# Patient Record
Sex: Male | Born: 1940 | Race: White | Hispanic: No | Smoking: Never smoker
Health system: Southern US, Community
[De-identification: ages and names within clinical notes are randomized; demographics above are authoritative.]

## PROBLEM LIST (undated history)

## (undated) DIAGNOSIS — I4821 Permanent atrial fibrillation: Secondary | ICD-10-CM

## (undated) DIAGNOSIS — I5032 Chronic diastolic (congestive) heart failure: Secondary | ICD-10-CM

## (undated) DIAGNOSIS — I739 Peripheral vascular disease, unspecified: Secondary | ICD-10-CM

## (undated) DIAGNOSIS — M109 Gout, unspecified: Secondary | ICD-10-CM

## (undated) DIAGNOSIS — N184 Chronic kidney disease, stage 4 (severe): Secondary | ICD-10-CM

## (undated) DIAGNOSIS — L97509 Non-pressure chronic ulcer of other part of unspecified foot with unspecified severity: Secondary | ICD-10-CM

## (undated) DIAGNOSIS — K859 Acute pancreatitis without necrosis or infection, unspecified: Secondary | ICD-10-CM

## (undated) DIAGNOSIS — Z22322 Carrier or suspected carrier of Methicillin resistant Staphylococcus aureus: Secondary | ICD-10-CM

## (undated) DIAGNOSIS — I10 Essential (primary) hypertension: Secondary | ICD-10-CM

## (undated) DIAGNOSIS — E119 Type 2 diabetes mellitus without complications: Secondary | ICD-10-CM

## (undated) DIAGNOSIS — E1140 Type 2 diabetes mellitus with diabetic neuropathy, unspecified: Secondary | ICD-10-CM

## (undated) DIAGNOSIS — E039 Hypothyroidism, unspecified: Secondary | ICD-10-CM

## (undated) HISTORY — DX: Non-pressure chronic ulcer of other part of unspecified foot with unspecified severity: L97.509

## (undated) HISTORY — DX: Gout, unspecified: M10.9

## (undated) HISTORY — DX: Hypothyroidism, unspecified: E03.9

## (undated) HISTORY — PX: ANKLE FRACTURE SURGERY: SHX122

## (undated) HISTORY — PX: MELANOMA EXCISION: SHX5266

## (undated) HISTORY — DX: Type 2 diabetes mellitus without complications: E11.9

## (undated) HISTORY — DX: Essential (primary) hypertension: I10

## (undated) HISTORY — PX: CATARACT EXTRACTION: SUR2

## (undated) HISTORY — PX: NEPHRECTOMY TRANSPLANTED ORGAN: SUR880

---

## 1998-03-14 ENCOUNTER — Encounter: Payer: Self-pay | Admitting: Ophthalmology

## 1998-03-15 ENCOUNTER — Ambulatory Visit (HOSPITAL_COMMUNITY): Admission: RE | Admit: 1998-03-15 | Discharge: 1998-03-15 | Payer: Self-pay | Admitting: Ophthalmology

## 1998-03-15 ENCOUNTER — Encounter: Payer: Self-pay | Admitting: Ophthalmology

## 2000-08-03 ENCOUNTER — Encounter: Payer: Self-pay | Admitting: General Surgery

## 2000-08-05 ENCOUNTER — Ambulatory Visit (HOSPITAL_COMMUNITY): Admission: RE | Admit: 2000-08-05 | Discharge: 2000-08-05 | Payer: Self-pay | Admitting: General Surgery

## 2002-09-06 ENCOUNTER — Encounter: Payer: Self-pay | Admitting: Emergency Medicine

## 2002-09-07 ENCOUNTER — Inpatient Hospital Stay (HOSPITAL_COMMUNITY): Admission: AC | Admit: 2002-09-07 | Discharge: 2002-09-08 | Payer: Self-pay

## 2002-09-08 ENCOUNTER — Encounter: Payer: Self-pay | Admitting: General Surgery

## 2004-07-29 ENCOUNTER — Other Ambulatory Visit: Admission: RE | Admit: 2004-07-29 | Discharge: 2004-07-29 | Payer: Self-pay | Admitting: Otolaryngology

## 2006-05-18 ENCOUNTER — Ambulatory Visit: Payer: Self-pay | Admitting: Oncology

## 2006-08-10 ENCOUNTER — Ambulatory Visit: Payer: Self-pay | Admitting: Oncology

## 2010-04-06 HISTORY — PX: KIDNEY TRANSPLANT: SHX239

## 2011-04-10 DIAGNOSIS — Z79899 Other long term (current) drug therapy: Secondary | ICD-10-CM | POA: Diagnosis not present

## 2011-04-10 DIAGNOSIS — Z7901 Long term (current) use of anticoagulants: Secondary | ICD-10-CM | POA: Diagnosis not present

## 2011-04-10 DIAGNOSIS — Z940 Kidney transplant status: Secondary | ICD-10-CM | POA: Diagnosis not present

## 2011-04-15 DIAGNOSIS — D044 Carcinoma in situ of skin of scalp and neck: Secondary | ICD-10-CM | POA: Diagnosis not present

## 2011-04-15 DIAGNOSIS — Z85828 Personal history of other malignant neoplasm of skin: Secondary | ICD-10-CM | POA: Diagnosis not present

## 2011-05-12 DIAGNOSIS — E039 Hypothyroidism, unspecified: Secondary | ICD-10-CM | POA: Diagnosis not present

## 2011-05-13 DIAGNOSIS — Z940 Kidney transplant status: Secondary | ICD-10-CM | POA: Diagnosis not present

## 2011-05-13 DIAGNOSIS — Z7901 Long term (current) use of anticoagulants: Secondary | ICD-10-CM | POA: Diagnosis not present

## 2011-05-13 DIAGNOSIS — Z79899 Other long term (current) drug therapy: Secondary | ICD-10-CM | POA: Diagnosis not present

## 2011-05-14 DIAGNOSIS — E785 Hyperlipidemia, unspecified: Secondary | ICD-10-CM | POA: Diagnosis not present

## 2011-05-14 DIAGNOSIS — E039 Hypothyroidism, unspecified: Secondary | ICD-10-CM | POA: Diagnosis not present

## 2011-05-27 DIAGNOSIS — Z940 Kidney transplant status: Secondary | ICD-10-CM | POA: Diagnosis not present

## 2011-05-27 DIAGNOSIS — N2581 Secondary hyperparathyroidism of renal origin: Secondary | ICD-10-CM | POA: Diagnosis not present

## 2011-05-27 DIAGNOSIS — I129 Hypertensive chronic kidney disease with stage 1 through stage 4 chronic kidney disease, or unspecified chronic kidney disease: Secondary | ICD-10-CM | POA: Diagnosis not present

## 2011-06-01 DIAGNOSIS — I10 Essential (primary) hypertension: Secondary | ICD-10-CM | POA: Diagnosis not present

## 2011-06-01 DIAGNOSIS — N189 Chronic kidney disease, unspecified: Secondary | ICD-10-CM | POA: Diagnosis not present

## 2011-06-01 DIAGNOSIS — I517 Cardiomegaly: Secondary | ICD-10-CM | POA: Diagnosis not present

## 2011-06-01 DIAGNOSIS — I5030 Unspecified diastolic (congestive) heart failure: Secondary | ICD-10-CM | POA: Diagnosis not present

## 2011-06-01 DIAGNOSIS — Z7901 Long term (current) use of anticoagulants: Secondary | ICD-10-CM | POA: Diagnosis not present

## 2011-06-01 DIAGNOSIS — I4891 Unspecified atrial fibrillation: Secondary | ICD-10-CM | POA: Diagnosis not present

## 2011-06-09 DIAGNOSIS — Z7901 Long term (current) use of anticoagulants: Secondary | ICD-10-CM | POA: Diagnosis not present

## 2011-06-09 DIAGNOSIS — E119 Type 2 diabetes mellitus without complications: Secondary | ICD-10-CM | POA: Diagnosis not present

## 2011-06-09 DIAGNOSIS — I4891 Unspecified atrial fibrillation: Secondary | ICD-10-CM | POA: Diagnosis not present

## 2011-06-10 DIAGNOSIS — Z79899 Other long term (current) drug therapy: Secondary | ICD-10-CM | POA: Diagnosis not present

## 2011-06-10 DIAGNOSIS — Z940 Kidney transplant status: Secondary | ICD-10-CM | POA: Diagnosis not present

## 2011-06-11 DIAGNOSIS — E785 Hyperlipidemia, unspecified: Secondary | ICD-10-CM | POA: Diagnosis not present

## 2011-06-12 DIAGNOSIS — E785 Hyperlipidemia, unspecified: Secondary | ICD-10-CM | POA: Diagnosis not present

## 2011-06-12 DIAGNOSIS — E039 Hypothyroidism, unspecified: Secondary | ICD-10-CM | POA: Diagnosis not present

## 2011-06-16 DIAGNOSIS — Z7901 Long term (current) use of anticoagulants: Secondary | ICD-10-CM | POA: Diagnosis not present

## 2011-06-16 DIAGNOSIS — I4891 Unspecified atrial fibrillation: Secondary | ICD-10-CM | POA: Diagnosis not present

## 2011-06-23 DIAGNOSIS — E119 Type 2 diabetes mellitus without complications: Secondary | ICD-10-CM | POA: Diagnosis not present

## 2011-06-23 DIAGNOSIS — E11359 Type 2 diabetes mellitus with proliferative diabetic retinopathy without macular edema: Secondary | ICD-10-CM | POA: Diagnosis not present

## 2011-06-30 DIAGNOSIS — Z7901 Long term (current) use of anticoagulants: Secondary | ICD-10-CM | POA: Diagnosis not present

## 2011-06-30 DIAGNOSIS — I4891 Unspecified atrial fibrillation: Secondary | ICD-10-CM | POA: Diagnosis not present

## 2011-07-08 DIAGNOSIS — Z79899 Other long term (current) drug therapy: Secondary | ICD-10-CM | POA: Diagnosis not present

## 2011-07-08 DIAGNOSIS — Z940 Kidney transplant status: Secondary | ICD-10-CM | POA: Diagnosis not present

## 2011-07-08 DIAGNOSIS — N2581 Secondary hyperparathyroidism of renal origin: Secondary | ICD-10-CM | POA: Diagnosis not present

## 2011-07-14 DIAGNOSIS — Z7901 Long term (current) use of anticoagulants: Secondary | ICD-10-CM | POA: Diagnosis not present

## 2011-07-14 DIAGNOSIS — I4891 Unspecified atrial fibrillation: Secondary | ICD-10-CM | POA: Diagnosis not present

## 2011-07-27 DIAGNOSIS — Z940 Kidney transplant status: Secondary | ICD-10-CM | POA: Diagnosis not present

## 2011-07-27 DIAGNOSIS — I70219 Atherosclerosis of native arteries of extremities with intermittent claudication, unspecified extremity: Secondary | ICD-10-CM | POA: Diagnosis not present

## 2011-07-27 DIAGNOSIS — I4891 Unspecified atrial fibrillation: Secondary | ICD-10-CM | POA: Diagnosis not present

## 2011-08-05 DIAGNOSIS — Z940 Kidney transplant status: Secondary | ICD-10-CM | POA: Diagnosis not present

## 2011-08-05 DIAGNOSIS — Z79899 Other long term (current) drug therapy: Secondary | ICD-10-CM | POA: Diagnosis not present

## 2011-08-19 DIAGNOSIS — Z7901 Long term (current) use of anticoagulants: Secondary | ICD-10-CM | POA: Diagnosis not present

## 2011-08-19 DIAGNOSIS — I4891 Unspecified atrial fibrillation: Secondary | ICD-10-CM | POA: Diagnosis not present

## 2011-08-26 DIAGNOSIS — I743 Embolism and thrombosis of arteries of the lower extremities: Secondary | ICD-10-CM | POA: Diagnosis not present

## 2011-08-26 DIAGNOSIS — I70219 Atherosclerosis of native arteries of extremities with intermittent claudication, unspecified extremity: Secondary | ICD-10-CM | POA: Diagnosis not present

## 2011-09-02 DIAGNOSIS — I4891 Unspecified atrial fibrillation: Secondary | ICD-10-CM | POA: Diagnosis not present

## 2011-09-02 DIAGNOSIS — Z7901 Long term (current) use of anticoagulants: Secondary | ICD-10-CM | POA: Diagnosis not present

## 2011-09-15 DIAGNOSIS — I4891 Unspecified atrial fibrillation: Secondary | ICD-10-CM | POA: Diagnosis not present

## 2011-09-15 DIAGNOSIS — Z7901 Long term (current) use of anticoagulants: Secondary | ICD-10-CM | POA: Diagnosis not present

## 2011-09-16 DIAGNOSIS — Z79899 Other long term (current) drug therapy: Secondary | ICD-10-CM | POA: Diagnosis not present

## 2011-09-16 DIAGNOSIS — Z940 Kidney transplant status: Secondary | ICD-10-CM | POA: Diagnosis not present

## 2011-09-17 DIAGNOSIS — Z85828 Personal history of other malignant neoplasm of skin: Secondary | ICD-10-CM | POA: Diagnosis not present

## 2011-09-24 DIAGNOSIS — R059 Cough, unspecified: Secondary | ICD-10-CM | POA: Diagnosis not present

## 2011-09-24 DIAGNOSIS — R05 Cough: Secondary | ICD-10-CM | POA: Diagnosis not present

## 2011-09-28 DIAGNOSIS — Z940 Kidney transplant status: Secondary | ICD-10-CM | POA: Diagnosis not present

## 2011-09-28 DIAGNOSIS — I10 Essential (primary) hypertension: Secondary | ICD-10-CM | POA: Diagnosis not present

## 2011-09-28 DIAGNOSIS — D649 Anemia, unspecified: Secondary | ICD-10-CM | POA: Diagnosis not present

## 2011-09-30 DIAGNOSIS — E039 Hypothyroidism, unspecified: Secondary | ICD-10-CM | POA: Diagnosis not present

## 2011-09-30 DIAGNOSIS — E785 Hyperlipidemia, unspecified: Secondary | ICD-10-CM | POA: Diagnosis not present

## 2011-10-02 DIAGNOSIS — E785 Hyperlipidemia, unspecified: Secondary | ICD-10-CM | POA: Diagnosis not present

## 2011-10-02 DIAGNOSIS — E039 Hypothyroidism, unspecified: Secondary | ICD-10-CM | POA: Diagnosis not present

## 2011-10-07 DIAGNOSIS — Z79899 Other long term (current) drug therapy: Secondary | ICD-10-CM | POA: Diagnosis not present

## 2011-10-07 DIAGNOSIS — Z940 Kidney transplant status: Secondary | ICD-10-CM | POA: Diagnosis not present

## 2011-10-13 DIAGNOSIS — I4891 Unspecified atrial fibrillation: Secondary | ICD-10-CM | POA: Diagnosis not present

## 2011-10-13 DIAGNOSIS — Z7901 Long term (current) use of anticoagulants: Secondary | ICD-10-CM | POA: Diagnosis not present

## 2011-10-27 DIAGNOSIS — Z7901 Long term (current) use of anticoagulants: Secondary | ICD-10-CM | POA: Diagnosis not present

## 2011-10-27 DIAGNOSIS — I4891 Unspecified atrial fibrillation: Secondary | ICD-10-CM | POA: Diagnosis not present

## 2011-11-10 DIAGNOSIS — L608 Other nail disorders: Secondary | ICD-10-CM | POA: Diagnosis not present

## 2011-11-10 DIAGNOSIS — E1059 Type 1 diabetes mellitus with other circulatory complications: Secondary | ICD-10-CM | POA: Diagnosis not present

## 2011-11-10 DIAGNOSIS — M79609 Pain in unspecified limb: Secondary | ICD-10-CM | POA: Diagnosis not present

## 2011-11-11 DIAGNOSIS — Z79899 Other long term (current) drug therapy: Secondary | ICD-10-CM | POA: Diagnosis not present

## 2011-11-11 DIAGNOSIS — N2581 Secondary hyperparathyroidism of renal origin: Secondary | ICD-10-CM | POA: Diagnosis not present

## 2011-11-11 DIAGNOSIS — Z940 Kidney transplant status: Secondary | ICD-10-CM | POA: Diagnosis not present

## 2011-11-24 DIAGNOSIS — I4891 Unspecified atrial fibrillation: Secondary | ICD-10-CM | POA: Diagnosis not present

## 2011-11-24 DIAGNOSIS — Z7901 Long term (current) use of anticoagulants: Secondary | ICD-10-CM | POA: Diagnosis not present

## 2011-12-16 DIAGNOSIS — Z79899 Other long term (current) drug therapy: Secondary | ICD-10-CM | POA: Diagnosis not present

## 2011-12-16 DIAGNOSIS — Z940 Kidney transplant status: Secondary | ICD-10-CM | POA: Diagnosis not present

## 2011-12-25 DIAGNOSIS — H35379 Puckering of macula, unspecified eye: Secondary | ICD-10-CM | POA: Diagnosis not present

## 2012-01-04 DIAGNOSIS — I4891 Unspecified atrial fibrillation: Secondary | ICD-10-CM | POA: Diagnosis not present

## 2012-01-04 DIAGNOSIS — E785 Hyperlipidemia, unspecified: Secondary | ICD-10-CM | POA: Diagnosis not present

## 2012-01-04 DIAGNOSIS — N189 Chronic kidney disease, unspecified: Secondary | ICD-10-CM | POA: Diagnosis not present

## 2012-01-04 DIAGNOSIS — I5030 Unspecified diastolic (congestive) heart failure: Secondary | ICD-10-CM | POA: Diagnosis not present

## 2012-01-05 DIAGNOSIS — E039 Hypothyroidism, unspecified: Secondary | ICD-10-CM | POA: Diagnosis not present

## 2012-01-05 DIAGNOSIS — I4891 Unspecified atrial fibrillation: Secondary | ICD-10-CM | POA: Diagnosis not present

## 2012-01-05 DIAGNOSIS — E785 Hyperlipidemia, unspecified: Secondary | ICD-10-CM | POA: Diagnosis not present

## 2012-01-07 DIAGNOSIS — Z23 Encounter for immunization: Secondary | ICD-10-CM | POA: Diagnosis not present

## 2012-01-07 DIAGNOSIS — E039 Hypothyroidism, unspecified: Secondary | ICD-10-CM | POA: Diagnosis not present

## 2012-01-07 DIAGNOSIS — E785 Hyperlipidemia, unspecified: Secondary | ICD-10-CM | POA: Diagnosis not present

## 2012-01-12 DIAGNOSIS — N185 Chronic kidney disease, stage 5: Secondary | ICD-10-CM | POA: Diagnosis not present

## 2012-01-12 DIAGNOSIS — D751 Secondary polycythemia: Secondary | ICD-10-CM | POA: Diagnosis not present

## 2012-01-12 DIAGNOSIS — Z940 Kidney transplant status: Secondary | ICD-10-CM | POA: Diagnosis not present

## 2012-01-13 DIAGNOSIS — Z79899 Other long term (current) drug therapy: Secondary | ICD-10-CM | POA: Diagnosis not present

## 2012-01-13 DIAGNOSIS — Z940 Kidney transplant status: Secondary | ICD-10-CM | POA: Diagnosis not present

## 2012-02-17 DIAGNOSIS — Z79899 Other long term (current) drug therapy: Secondary | ICD-10-CM | POA: Diagnosis not present

## 2012-02-17 DIAGNOSIS — N2581 Secondary hyperparathyroidism of renal origin: Secondary | ICD-10-CM | POA: Diagnosis not present

## 2012-02-17 DIAGNOSIS — Z940 Kidney transplant status: Secondary | ICD-10-CM | POA: Diagnosis not present

## 2012-02-24 DIAGNOSIS — I4891 Unspecified atrial fibrillation: Secondary | ICD-10-CM | POA: Diagnosis not present

## 2012-02-24 DIAGNOSIS — Z7901 Long term (current) use of anticoagulants: Secondary | ICD-10-CM | POA: Diagnosis not present

## 2012-02-25 DIAGNOSIS — E039 Hypothyroidism, unspecified: Secondary | ICD-10-CM | POA: Diagnosis not present

## 2012-02-25 DIAGNOSIS — I10 Essential (primary) hypertension: Secondary | ICD-10-CM | POA: Diagnosis not present

## 2012-03-07 DIAGNOSIS — Z79899 Other long term (current) drug therapy: Secondary | ICD-10-CM | POA: Diagnosis not present

## 2012-03-07 DIAGNOSIS — E119 Type 2 diabetes mellitus without complications: Secondary | ICD-10-CM | POA: Diagnosis not present

## 2012-03-07 DIAGNOSIS — Z48298 Encounter for aftercare following other organ transplant: Secondary | ICD-10-CM | POA: Diagnosis not present

## 2012-03-07 DIAGNOSIS — N186 End stage renal disease: Secondary | ICD-10-CM | POA: Diagnosis not present

## 2012-03-07 DIAGNOSIS — Z940 Kidney transplant status: Secondary | ICD-10-CM | POA: Diagnosis not present

## 2012-03-08 DIAGNOSIS — M2040 Other hammer toe(s) (acquired), unspecified foot: Secondary | ICD-10-CM | POA: Diagnosis not present

## 2012-03-08 DIAGNOSIS — L97509 Non-pressure chronic ulcer of other part of unspecified foot with unspecified severity: Secondary | ICD-10-CM | POA: Diagnosis not present

## 2012-03-08 DIAGNOSIS — L03119 Cellulitis of unspecified part of limb: Secondary | ICD-10-CM | POA: Diagnosis not present

## 2012-03-08 DIAGNOSIS — Z85828 Personal history of other malignant neoplasm of skin: Secondary | ICD-10-CM | POA: Diagnosis not present

## 2012-03-08 DIAGNOSIS — E1069 Type 1 diabetes mellitus with other specified complication: Secondary | ICD-10-CM | POA: Diagnosis not present

## 2012-03-08 DIAGNOSIS — L02619 Cutaneous abscess of unspecified foot: Secondary | ICD-10-CM | POA: Diagnosis not present

## 2012-03-08 DIAGNOSIS — D485 Neoplasm of uncertain behavior of skin: Secondary | ICD-10-CM | POA: Diagnosis not present

## 2012-03-16 DIAGNOSIS — IMO0001 Reserved for inherently not codable concepts without codable children: Secondary | ICD-10-CM | POA: Diagnosis not present

## 2012-03-16 DIAGNOSIS — Z940 Kidney transplant status: Secondary | ICD-10-CM | POA: Diagnosis not present

## 2012-03-16 DIAGNOSIS — N2581 Secondary hyperparathyroidism of renal origin: Secondary | ICD-10-CM | POA: Diagnosis not present

## 2012-03-16 DIAGNOSIS — Z79899 Other long term (current) drug therapy: Secondary | ICD-10-CM | POA: Diagnosis not present

## 2012-03-22 DIAGNOSIS — Z7901 Long term (current) use of anticoagulants: Secondary | ICD-10-CM | POA: Diagnosis not present

## 2012-03-22 DIAGNOSIS — I4891 Unspecified atrial fibrillation: Secondary | ICD-10-CM | POA: Diagnosis not present

## 2012-04-01 DIAGNOSIS — B351 Tinea unguium: Secondary | ICD-10-CM | POA: Diagnosis not present

## 2012-04-01 DIAGNOSIS — M79609 Pain in unspecified limb: Secondary | ICD-10-CM | POA: Diagnosis not present

## 2012-04-01 DIAGNOSIS — E1069 Type 1 diabetes mellitus with other specified complication: Secondary | ICD-10-CM | POA: Diagnosis not present

## 2012-04-01 DIAGNOSIS — L608 Other nail disorders: Secondary | ICD-10-CM | POA: Diagnosis not present

## 2012-04-19 DIAGNOSIS — I4891 Unspecified atrial fibrillation: Secondary | ICD-10-CM | POA: Diagnosis not present

## 2012-04-19 DIAGNOSIS — Z7901 Long term (current) use of anticoagulants: Secondary | ICD-10-CM | POA: Diagnosis not present

## 2012-04-20 DIAGNOSIS — Z940 Kidney transplant status: Secondary | ICD-10-CM | POA: Diagnosis not present

## 2012-04-20 DIAGNOSIS — Z79899 Other long term (current) drug therapy: Secondary | ICD-10-CM | POA: Diagnosis not present

## 2012-04-20 DIAGNOSIS — N2581 Secondary hyperparathyroidism of renal origin: Secondary | ICD-10-CM | POA: Diagnosis not present

## 2012-05-10 DIAGNOSIS — I10 Essential (primary) hypertension: Secondary | ICD-10-CM | POA: Diagnosis not present

## 2012-05-10 DIAGNOSIS — Z940 Kidney transplant status: Secondary | ICD-10-CM | POA: Diagnosis not present

## 2012-05-10 DIAGNOSIS — D649 Anemia, unspecified: Secondary | ICD-10-CM | POA: Diagnosis not present

## 2012-05-10 DIAGNOSIS — N185 Chronic kidney disease, stage 5: Secondary | ICD-10-CM | POA: Diagnosis not present

## 2012-05-17 DIAGNOSIS — Z7901 Long term (current) use of anticoagulants: Secondary | ICD-10-CM | POA: Diagnosis not present

## 2012-05-17 DIAGNOSIS — I4891 Unspecified atrial fibrillation: Secondary | ICD-10-CM | POA: Diagnosis not present

## 2012-05-18 DIAGNOSIS — N2581 Secondary hyperparathyroidism of renal origin: Secondary | ICD-10-CM | POA: Diagnosis not present

## 2012-05-18 DIAGNOSIS — Z940 Kidney transplant status: Secondary | ICD-10-CM | POA: Diagnosis not present

## 2012-05-18 DIAGNOSIS — Z79899 Other long term (current) drug therapy: Secondary | ICD-10-CM | POA: Diagnosis not present

## 2012-05-26 DIAGNOSIS — E1069 Type 1 diabetes mellitus with other specified complication: Secondary | ICD-10-CM | POA: Diagnosis not present

## 2012-05-26 DIAGNOSIS — L97509 Non-pressure chronic ulcer of other part of unspecified foot with unspecified severity: Secondary | ICD-10-CM | POA: Diagnosis not present

## 2012-06-01 DIAGNOSIS — E039 Hypothyroidism, unspecified: Secondary | ICD-10-CM | POA: Diagnosis not present

## 2012-06-01 DIAGNOSIS — M109 Gout, unspecified: Secondary | ICD-10-CM | POA: Diagnosis not present

## 2012-06-01 DIAGNOSIS — E785 Hyperlipidemia, unspecified: Secondary | ICD-10-CM | POA: Diagnosis not present

## 2012-06-03 DIAGNOSIS — E119 Type 2 diabetes mellitus without complications: Secondary | ICD-10-CM | POA: Diagnosis not present

## 2012-06-03 DIAGNOSIS — N189 Chronic kidney disease, unspecified: Secondary | ICD-10-CM | POA: Diagnosis not present

## 2012-06-03 DIAGNOSIS — I4891 Unspecified atrial fibrillation: Secondary | ICD-10-CM | POA: Diagnosis not present

## 2012-06-03 DIAGNOSIS — M109 Gout, unspecified: Secondary | ICD-10-CM | POA: Diagnosis not present

## 2012-06-03 DIAGNOSIS — I10 Essential (primary) hypertension: Secondary | ICD-10-CM | POA: Diagnosis not present

## 2012-06-07 DIAGNOSIS — Z85828 Personal history of other malignant neoplasm of skin: Secondary | ICD-10-CM | POA: Diagnosis not present

## 2012-06-13 DIAGNOSIS — L03119 Cellulitis of unspecified part of limb: Secondary | ICD-10-CM | POA: Diagnosis not present

## 2012-06-13 DIAGNOSIS — M25473 Effusion, unspecified ankle: Secondary | ICD-10-CM | POA: Diagnosis not present

## 2012-06-13 DIAGNOSIS — E109 Type 1 diabetes mellitus without complications: Secondary | ICD-10-CM | POA: Diagnosis not present

## 2012-06-13 DIAGNOSIS — L02419 Cutaneous abscess of limb, unspecified: Secondary | ICD-10-CM | POA: Diagnosis not present

## 2012-06-13 DIAGNOSIS — M25579 Pain in unspecified ankle and joints of unspecified foot: Secondary | ICD-10-CM | POA: Diagnosis not present

## 2012-06-14 DIAGNOSIS — Z7901 Long term (current) use of anticoagulants: Secondary | ICD-10-CM | POA: Diagnosis not present

## 2012-06-14 DIAGNOSIS — I4891 Unspecified atrial fibrillation: Secondary | ICD-10-CM | POA: Diagnosis not present

## 2012-06-14 DIAGNOSIS — L02619 Cutaneous abscess of unspecified foot: Secondary | ICD-10-CM | POA: Diagnosis not present

## 2012-06-15 DIAGNOSIS — Z940 Kidney transplant status: Secondary | ICD-10-CM | POA: Diagnosis not present

## 2012-06-15 DIAGNOSIS — N2581 Secondary hyperparathyroidism of renal origin: Secondary | ICD-10-CM | POA: Diagnosis not present

## 2012-06-15 DIAGNOSIS — Z79899 Other long term (current) drug therapy: Secondary | ICD-10-CM | POA: Diagnosis not present

## 2012-06-16 DIAGNOSIS — Z79899 Other long term (current) drug therapy: Secondary | ICD-10-CM | POA: Diagnosis not present

## 2012-06-16 DIAGNOSIS — D751 Secondary polycythemia: Secondary | ICD-10-CM | POA: Diagnosis not present

## 2012-06-21 DIAGNOSIS — Z7901 Long term (current) use of anticoagulants: Secondary | ICD-10-CM | POA: Diagnosis not present

## 2012-06-21 DIAGNOSIS — I4891 Unspecified atrial fibrillation: Secondary | ICD-10-CM | POA: Diagnosis not present

## 2012-06-22 DIAGNOSIS — M25579 Pain in unspecified ankle and joints of unspecified foot: Secondary | ICD-10-CM | POA: Diagnosis not present

## 2012-06-22 DIAGNOSIS — L97409 Non-pressure chronic ulcer of unspecified heel and midfoot with unspecified severity: Secondary | ICD-10-CM | POA: Diagnosis not present

## 2012-06-22 DIAGNOSIS — L03119 Cellulitis of unspecified part of limb: Secondary | ICD-10-CM | POA: Diagnosis not present

## 2012-06-22 DIAGNOSIS — L02419 Cutaneous abscess of limb, unspecified: Secondary | ICD-10-CM | POA: Diagnosis not present

## 2012-06-24 ENCOUNTER — Encounter: Payer: Self-pay | Admitting: *Deleted

## 2012-06-24 ENCOUNTER — Telehealth (HOSPITAL_COMMUNITY): Payer: Self-pay | Admitting: *Deleted

## 2012-06-24 ENCOUNTER — Ambulatory Visit (INDEPENDENT_AMBULATORY_CARE_PROVIDER_SITE_OTHER): Payer: Medicare Other | Admitting: Podiatry

## 2012-06-24 VITALS — BP 136/74 | HR 70 | Ht 69.0 in | Wt 232.0 lb

## 2012-06-24 DIAGNOSIS — L98499 Non-pressure chronic ulcer of skin of other sites with unspecified severity: Secondary | ICD-10-CM

## 2012-06-24 DIAGNOSIS — L03115 Cellulitis of right lower limb: Secondary | ICD-10-CM | POA: Insufficient documentation

## 2012-06-24 DIAGNOSIS — L03119 Cellulitis of unspecified part of limb: Secondary | ICD-10-CM | POA: Diagnosis not present

## 2012-06-24 DIAGNOSIS — L02419 Cutaneous abscess of limb, unspecified: Secondary | ICD-10-CM

## 2012-06-24 NOTE — Patient Instructions (Addendum)
Please finish the antibiotics. Continue with daily wound care with Silvrstat. Wait for a call from [facility] for appointment. We will follow up with you next week.

## 2012-06-24 NOTE — Progress Notes (Signed)
SUBJECTIVE: 72 y.o. year old male presents for follow up care on the right leg lesion. States the leg started to hurt more for the last 2 days.  Patient first noted pain and swelling of right ankle on 06/01/2012, and was treated for Gout at his primary care physician. Patient continue to develop cellulitis with skin necrosis over the medial aspect of right lower limb. Patient was seen at this office for this lesion on 06/13/2012 and put on Augmentin 875 mg bid for the last 2 weeks. Local wound care at home continued with Slivrstat. When patient was seen again on 06/17/2012, the lesion was mostly superficial, but with a small sinus tract type of central drainage.  Today the wound has deep ulceration with necrotic tissue deep down to the subfascial layer, with 1.5 cm in diameter and about 0.6 cm depth. Previous X-ray indicated that the wound is in close proximity to the existing internal fixation screw. The culture report received and reviewed. Result indicated that no significant staph or strep pyogenes.   REVIEW OF SYSTEMS: Constitutional: negative. Denies having fever or chill. Having much pain on right lower limb. S/P Kidney transplant left. Blood sugar is in good control.    OBJECTIVE: DERMATOLOGIC EXAMINATION: Right lower leg, above the medial ankle lesion is open in circular shape with deep punctated, necrotic yellow and grey tissue filled in. This is a change from one which ago. At that time there was only a small draining hole.   Surrounding skin is red and erythematic. No active drainage at this time. The wound base is close to the underlying bone.    VASCULAR EXAMINATION OF LOWER LIMBS: Pedal pulses: Pedal pulses are not palpable. Right lower leg edema has decreased since last visit. Temperature gradient from tibial crest to dorsum of foot is within normal bilateral.  NEUROLOGIC EXAMINATION OF THE LOWER LIMBS: Deferred.  Previously diagnosed for Peripheral neuropathy.    MUSCULOSKELETAL EXAMINATION: Mild digital contracture without open lesions.  ASSESSMENT: 1. Cellulitis right leg. 2. Progressive open ulcer with necrosis of soft tissue, 1.5cm in diameter with 0.5 cm depth. 3. Venomous insect bite, suspected. 4. Type II IDDM  PLAN: Discussed clinical findings and need for referral to wound care center. The condition is complex due to existing internal fixation screw near the open wound, poor circulation, diabetic condition, and extent of the loss of tissue.   Patient prefers to go to [facility] wound care center. Referral to [facility] initiated.  We will make arrangement.

## 2012-06-24 NOTE — ED Notes (Signed)
Peggy from the Wound Care Center called and said pt. needs a referral. I tried to access a chart but no recent encounters for this pt. I called back and left a message that we had not seen this pt. and we did not do the referral. We are not a primary care facility.  She will have to get it from pt. 's PCP. Vassie Moselle 06/24/2012

## 2012-06-28 ENCOUNTER — Ambulatory Visit: Payer: Self-pay | Admitting: Podiatry

## 2012-06-28 DIAGNOSIS — I4891 Unspecified atrial fibrillation: Secondary | ICD-10-CM | POA: Diagnosis not present

## 2012-06-28 DIAGNOSIS — E11359 Type 2 diabetes mellitus with proliferative diabetic retinopathy without macular edema: Secondary | ICD-10-CM | POA: Diagnosis not present

## 2012-06-28 DIAGNOSIS — Z940 Kidney transplant status: Secondary | ICD-10-CM | POA: Diagnosis not present

## 2012-06-28 DIAGNOSIS — E119 Type 2 diabetes mellitus without complications: Secondary | ICD-10-CM | POA: Diagnosis not present

## 2012-06-28 DIAGNOSIS — Z794 Long term (current) use of insulin: Secondary | ICD-10-CM | POA: Diagnosis not present

## 2012-06-29 ENCOUNTER — Encounter (HOSPITAL_BASED_OUTPATIENT_CLINIC_OR_DEPARTMENT_OTHER): Payer: Medicare Other | Attending: General Surgery

## 2012-06-29 DIAGNOSIS — I4891 Unspecified atrial fibrillation: Secondary | ICD-10-CM | POA: Insufficient documentation

## 2012-06-29 DIAGNOSIS — E119 Type 2 diabetes mellitus without complications: Secondary | ICD-10-CM | POA: Diagnosis not present

## 2012-06-29 DIAGNOSIS — E1169 Type 2 diabetes mellitus with other specified complication: Secondary | ICD-10-CM | POA: Insufficient documentation

## 2012-06-29 DIAGNOSIS — E1159 Type 2 diabetes mellitus with other circulatory complications: Secondary | ICD-10-CM | POA: Diagnosis not present

## 2012-06-29 DIAGNOSIS — E039 Hypothyroidism, unspecified: Secondary | ICD-10-CM | POA: Insufficient documentation

## 2012-06-29 DIAGNOSIS — I120 Hypertensive chronic kidney disease with stage 5 chronic kidney disease or end stage renal disease: Secondary | ICD-10-CM | POA: Insufficient documentation

## 2012-06-29 DIAGNOSIS — L97409 Non-pressure chronic ulcer of unspecified heel and midfoot with unspecified severity: Secondary | ICD-10-CM | POA: Insufficient documentation

## 2012-06-29 DIAGNOSIS — N186 End stage renal disease: Secondary | ICD-10-CM | POA: Diagnosis not present

## 2012-06-29 DIAGNOSIS — L97509 Non-pressure chronic ulcer of other part of unspecified foot with unspecified severity: Secondary | ICD-10-CM | POA: Diagnosis not present

## 2012-06-30 DIAGNOSIS — I4891 Unspecified atrial fibrillation: Secondary | ICD-10-CM | POA: Diagnosis not present

## 2012-06-30 DIAGNOSIS — Z7901 Long term (current) use of anticoagulants: Secondary | ICD-10-CM | POA: Diagnosis not present

## 2012-06-30 NOTE — Progress Notes (Signed)
[facility]  NAME:  Mark [name], Mark [name]             ACCOUNT NO.:  [id number]  MEDICAL RECORD NO.:  [id number]      DATE OF BIRTH:  01/09/1941  PHYSICIAN:  Ardath Sax, M.D.           VISIT DATE:                                  OFFICE VISIT   A 72 year old diabetic man who comes to [place] with an ulcer on his right ankle.  It is about a centimeter and a half in diameter and goes right down to the fascia.  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  He also states that he has a history of fracture of his tibia and fibula and has hardware in there, although an x-ray was taken, which shows no evidence of involvement of the hardware on this old fracture and there is no evidence of any osteomyelitis.  The ulcer is right in the old scar where they went in to put the hardware.  I debrided this ulcer and I am going to treat it with Santyl with daily applications, and I will have him come back in a week and reassess what we can do. There is a little bit of undermining here and I think this will take some time before we can put on biologic grafts.  His blood pressure is 177/104, pulse 67, respirations 18, temperature 97.3.  He is on several medicines including Prograf because he had a kidney transplant many years ago, which apparently he is doing very well with.  He is also on Coumadin and K-Dur, magnesium oxide.  He is on levothyroxine and he is also on Lasix 80 mg a day.  For this wound, has been on Augmentin 875- 125 twice a day.  He is on oxycodone for pain.  So, he has a diagnosis of diabetes for which he takes Lantus insulin and he also has a diagnosis of a Wagner III diabetic ulcer.  He also has a history of end- stage renal disease, and he is on Prograf for the kidney transplant that he had 8 years ago now.  He also has a history of peripheral arterial disease that his family doctor says has been worked up adequately.  He also has a history of atrial  fibrillation and hypertension and he takes metoprolol for his hypertension.  He is also on digitalis, so his diagnoses are, 1. Type 2 diabetes. 2. End-stage renal disease, post kidney transplant. 3. Atrial fibrillation. 4. Hypothyroidism. 5. Hypertension 6. Wagner III diabetic foot ulcer,  right ankle.     Ardath Sax, M.D.     PP/MEDQ  D:  06/29/2012  T:  06/30/2012  Job:  [id number]

## 2012-07-06 ENCOUNTER — Encounter (HOSPITAL_BASED_OUTPATIENT_CLINIC_OR_DEPARTMENT_OTHER): Payer: Medicare Other | Attending: General Surgery

## 2012-07-06 DIAGNOSIS — L97409 Non-pressure chronic ulcer of unspecified heel and midfoot with unspecified severity: Secondary | ICD-10-CM | POA: Diagnosis not present

## 2012-07-06 DIAGNOSIS — E1169 Type 2 diabetes mellitus with other specified complication: Secondary | ICD-10-CM | POA: Insufficient documentation

## 2012-07-13 DIAGNOSIS — E1169 Type 2 diabetes mellitus with other specified complication: Secondary | ICD-10-CM | POA: Diagnosis not present

## 2012-07-13 DIAGNOSIS — N2581 Secondary hyperparathyroidism of renal origin: Secondary | ICD-10-CM | POA: Diagnosis not present

## 2012-07-13 DIAGNOSIS — Z940 Kidney transplant status: Secondary | ICD-10-CM | POA: Diagnosis not present

## 2012-07-13 DIAGNOSIS — Z79899 Other long term (current) drug therapy: Secondary | ICD-10-CM | POA: Diagnosis not present

## 2012-07-13 DIAGNOSIS — E119 Type 2 diabetes mellitus without complications: Secondary | ICD-10-CM | POA: Diagnosis not present

## 2012-07-13 DIAGNOSIS — L97409 Non-pressure chronic ulcer of unspecified heel and midfoot with unspecified severity: Secondary | ICD-10-CM | POA: Diagnosis not present

## 2012-07-14 ENCOUNTER — Telehealth: Payer: Self-pay | Admitting: Podiatry

## 2012-07-14 DIAGNOSIS — I4891 Unspecified atrial fibrillation: Secondary | ICD-10-CM | POA: Diagnosis not present

## 2012-07-14 DIAGNOSIS — Z7901 Long term (current) use of anticoagulants: Secondary | ICD-10-CM | POA: Diagnosis not present

## 2012-07-14 NOTE — Telephone Encounter (Signed)
Note: Call yesterday requesting X-Ray Report on Mr. Mark [name]. Please Fax to [facility] @ [phone] Thanks! [facility] Ph # [phone]

## 2012-07-20 DIAGNOSIS — I110 Hypertensive heart disease with heart failure: Secondary | ICD-10-CM | POA: Diagnosis not present

## 2012-07-20 DIAGNOSIS — I4891 Unspecified atrial fibrillation: Secondary | ICD-10-CM | POA: Diagnosis not present

## 2012-07-20 DIAGNOSIS — I5030 Unspecified diastolic (congestive) heart failure: Secondary | ICD-10-CM | POA: Diagnosis not present

## 2012-07-20 DIAGNOSIS — L97409 Non-pressure chronic ulcer of unspecified heel and midfoot with unspecified severity: Secondary | ICD-10-CM | POA: Diagnosis not present

## 2012-07-20 DIAGNOSIS — I509 Heart failure, unspecified: Secondary | ICD-10-CM | POA: Diagnosis not present

## 2012-07-20 DIAGNOSIS — E1169 Type 2 diabetes mellitus with other specified complication: Secondary | ICD-10-CM | POA: Diagnosis not present

## 2012-07-21 DIAGNOSIS — E785 Hyperlipidemia, unspecified: Secondary | ICD-10-CM | POA: Diagnosis not present

## 2012-07-21 DIAGNOSIS — I4891 Unspecified atrial fibrillation: Secondary | ICD-10-CM | POA: Diagnosis not present

## 2012-07-27 DIAGNOSIS — L97409 Non-pressure chronic ulcer of unspecified heel and midfoot with unspecified severity: Secondary | ICD-10-CM | POA: Diagnosis not present

## 2012-07-27 DIAGNOSIS — E1169 Type 2 diabetes mellitus with other specified complication: Secondary | ICD-10-CM | POA: Diagnosis not present

## 2012-07-28 DIAGNOSIS — I4891 Unspecified atrial fibrillation: Secondary | ICD-10-CM | POA: Diagnosis not present

## 2012-07-28 DIAGNOSIS — Z7901 Long term (current) use of anticoagulants: Secondary | ICD-10-CM | POA: Diagnosis not present

## 2012-08-02 DIAGNOSIS — M109 Gout, unspecified: Secondary | ICD-10-CM | POA: Diagnosis not present

## 2012-08-03 DIAGNOSIS — L97409 Non-pressure chronic ulcer of unspecified heel and midfoot with unspecified severity: Secondary | ICD-10-CM | POA: Diagnosis not present

## 2012-08-03 DIAGNOSIS — E1169 Type 2 diabetes mellitus with other specified complication: Secondary | ICD-10-CM | POA: Diagnosis not present

## 2012-08-10 ENCOUNTER — Encounter (HOSPITAL_BASED_OUTPATIENT_CLINIC_OR_DEPARTMENT_OTHER): Payer: Medicare Other | Attending: General Surgery

## 2012-08-10 DIAGNOSIS — L97909 Non-pressure chronic ulcer of unspecified part of unspecified lower leg with unspecified severity: Secondary | ICD-10-CM | POA: Diagnosis not present

## 2012-08-10 DIAGNOSIS — E1169 Type 2 diabetes mellitus with other specified complication: Secondary | ICD-10-CM | POA: Insufficient documentation

## 2012-08-10 DIAGNOSIS — I87319 Chronic venous hypertension (idiopathic) with ulcer of unspecified lower extremity: Secondary | ICD-10-CM | POA: Insufficient documentation

## 2012-08-10 DIAGNOSIS — L97809 Non-pressure chronic ulcer of other part of unspecified lower leg with unspecified severity: Secondary | ICD-10-CM | POA: Diagnosis not present

## 2012-08-11 DIAGNOSIS — E785 Hyperlipidemia, unspecified: Secondary | ICD-10-CM | POA: Diagnosis not present

## 2012-08-11 DIAGNOSIS — R197 Diarrhea, unspecified: Secondary | ICD-10-CM | POA: Diagnosis not present

## 2012-08-11 DIAGNOSIS — Z7901 Long term (current) use of anticoagulants: Secondary | ICD-10-CM | POA: Diagnosis not present

## 2012-08-11 DIAGNOSIS — I4891 Unspecified atrial fibrillation: Secondary | ICD-10-CM | POA: Diagnosis not present

## 2012-08-16 DIAGNOSIS — Z79899 Other long term (current) drug therapy: Secondary | ICD-10-CM | POA: Diagnosis not present

## 2012-08-16 DIAGNOSIS — Z940 Kidney transplant status: Secondary | ICD-10-CM | POA: Diagnosis not present

## 2012-08-17 DIAGNOSIS — I87319 Chronic venous hypertension (idiopathic) with ulcer of unspecified lower extremity: Secondary | ICD-10-CM | POA: Diagnosis not present

## 2012-08-17 DIAGNOSIS — E1169 Type 2 diabetes mellitus with other specified complication: Secondary | ICD-10-CM | POA: Diagnosis not present

## 2012-08-17 DIAGNOSIS — L97809 Non-pressure chronic ulcer of other part of unspecified lower leg with unspecified severity: Secondary | ICD-10-CM | POA: Diagnosis not present

## 2012-08-18 DIAGNOSIS — Z7901 Long term (current) use of anticoagulants: Secondary | ICD-10-CM | POA: Diagnosis not present

## 2012-08-18 DIAGNOSIS — I4891 Unspecified atrial fibrillation: Secondary | ICD-10-CM | POA: Diagnosis not present

## 2012-08-24 DIAGNOSIS — L97909 Non-pressure chronic ulcer of unspecified part of unspecified lower leg with unspecified severity: Secondary | ICD-10-CM | POA: Diagnosis not present

## 2012-08-24 DIAGNOSIS — E1169 Type 2 diabetes mellitus with other specified complication: Secondary | ICD-10-CM | POA: Diagnosis not present

## 2012-08-24 DIAGNOSIS — L97809 Non-pressure chronic ulcer of other part of unspecified lower leg with unspecified severity: Secondary | ICD-10-CM | POA: Diagnosis not present

## 2012-08-25 DIAGNOSIS — R197 Diarrhea, unspecified: Secondary | ICD-10-CM | POA: Diagnosis not present

## 2012-08-26 DIAGNOSIS — R197 Diarrhea, unspecified: Secondary | ICD-10-CM | POA: Diagnosis not present

## 2012-08-31 DIAGNOSIS — L97809 Non-pressure chronic ulcer of other part of unspecified lower leg with unspecified severity: Secondary | ICD-10-CM | POA: Diagnosis not present

## 2012-08-31 DIAGNOSIS — L97909 Non-pressure chronic ulcer of unspecified part of unspecified lower leg with unspecified severity: Secondary | ICD-10-CM | POA: Diagnosis not present

## 2012-08-31 DIAGNOSIS — E1169 Type 2 diabetes mellitus with other specified complication: Secondary | ICD-10-CM | POA: Diagnosis not present

## 2012-09-01 DIAGNOSIS — I4891 Unspecified atrial fibrillation: Secondary | ICD-10-CM | POA: Diagnosis not present

## 2012-09-01 DIAGNOSIS — Z7901 Long term (current) use of anticoagulants: Secondary | ICD-10-CM | POA: Diagnosis not present

## 2012-09-07 ENCOUNTER — Encounter (HOSPITAL_BASED_OUTPATIENT_CLINIC_OR_DEPARTMENT_OTHER): Payer: Medicare Other | Attending: General Surgery

## 2012-09-07 DIAGNOSIS — I872 Venous insufficiency (chronic) (peripheral): Secondary | ICD-10-CM | POA: Insufficient documentation

## 2012-09-07 DIAGNOSIS — L97809 Non-pressure chronic ulcer of other part of unspecified lower leg with unspecified severity: Secondary | ICD-10-CM | POA: Insufficient documentation

## 2012-09-07 DIAGNOSIS — I87309 Chronic venous hypertension (idiopathic) without complications of unspecified lower extremity: Secondary | ICD-10-CM | POA: Diagnosis not present

## 2012-09-13 DIAGNOSIS — Z940 Kidney transplant status: Secondary | ICD-10-CM | POA: Diagnosis not present

## 2012-09-13 DIAGNOSIS — Z79899 Other long term (current) drug therapy: Secondary | ICD-10-CM | POA: Diagnosis not present

## 2012-09-15 DIAGNOSIS — I4891 Unspecified atrial fibrillation: Secondary | ICD-10-CM | POA: Diagnosis not present

## 2012-09-15 DIAGNOSIS — Z7901 Long term (current) use of anticoagulants: Secondary | ICD-10-CM | POA: Diagnosis not present

## 2012-09-21 DIAGNOSIS — R197 Diarrhea, unspecified: Secondary | ICD-10-CM | POA: Diagnosis not present

## 2012-09-28 DIAGNOSIS — I10 Essential (primary) hypertension: Secondary | ICD-10-CM | POA: Diagnosis not present

## 2012-09-28 DIAGNOSIS — E038 Other specified hypothyroidism: Secondary | ICD-10-CM | POA: Diagnosis not present

## 2012-09-28 DIAGNOSIS — R197 Diarrhea, unspecified: Secondary | ICD-10-CM | POA: Diagnosis not present

## 2012-09-28 DIAGNOSIS — I4891 Unspecified atrial fibrillation: Secondary | ICD-10-CM | POA: Diagnosis not present

## 2012-09-28 DIAGNOSIS — M109 Gout, unspecified: Secondary | ICD-10-CM | POA: Diagnosis not present

## 2012-09-28 DIAGNOSIS — E119 Type 2 diabetes mellitus without complications: Secondary | ICD-10-CM | POA: Diagnosis not present

## 2012-09-28 DIAGNOSIS — R7989 Other specified abnormal findings of blood chemistry: Secondary | ICD-10-CM | POA: Diagnosis not present

## 2012-09-28 DIAGNOSIS — N189 Chronic kidney disease, unspecified: Secondary | ICD-10-CM | POA: Diagnosis not present

## 2012-09-28 DIAGNOSIS — E7800 Pure hypercholesterolemia, unspecified: Secondary | ICD-10-CM | POA: Diagnosis not present

## 2012-09-29 DIAGNOSIS — Z85820 Personal history of malignant melanoma of skin: Secondary | ICD-10-CM | POA: Diagnosis not present

## 2012-09-29 DIAGNOSIS — D485 Neoplasm of uncertain behavior of skin: Secondary | ICD-10-CM | POA: Diagnosis not present

## 2012-10-12 DIAGNOSIS — Z7901 Long term (current) use of anticoagulants: Secondary | ICD-10-CM | POA: Diagnosis not present

## 2012-10-12 DIAGNOSIS — I4891 Unspecified atrial fibrillation: Secondary | ICD-10-CM | POA: Diagnosis not present

## 2012-10-13 DIAGNOSIS — Z79899 Other long term (current) drug therapy: Secondary | ICD-10-CM | POA: Diagnosis not present

## 2012-10-13 DIAGNOSIS — IMO0001 Reserved for inherently not codable concepts without codable children: Secondary | ICD-10-CM | POA: Diagnosis not present

## 2012-10-13 DIAGNOSIS — N2581 Secondary hyperparathyroidism of renal origin: Secondary | ICD-10-CM | POA: Diagnosis not present

## 2012-10-13 DIAGNOSIS — Z940 Kidney transplant status: Secondary | ICD-10-CM | POA: Diagnosis not present

## 2012-10-17 DIAGNOSIS — E1139 Type 2 diabetes mellitus with other diabetic ophthalmic complication: Secondary | ICD-10-CM | POA: Diagnosis not present

## 2012-10-17 DIAGNOSIS — Z961 Presence of intraocular lens: Secondary | ICD-10-CM | POA: Diagnosis not present

## 2012-10-17 DIAGNOSIS — Z9849 Cataract extraction status, unspecified eye: Secondary | ICD-10-CM | POA: Diagnosis not present

## 2012-10-17 DIAGNOSIS — E119 Type 2 diabetes mellitus without complications: Secondary | ICD-10-CM | POA: Diagnosis not present

## 2012-10-18 ENCOUNTER — Other Ambulatory Visit: Payer: Self-pay | Admitting: *Deleted

## 2012-10-18 ENCOUNTER — Other Ambulatory Visit (INDEPENDENT_AMBULATORY_CARE_PROVIDER_SITE_OTHER): Payer: Medicare Other

## 2012-10-18 DIAGNOSIS — E039 Hypothyroidism, unspecified: Secondary | ICD-10-CM

## 2012-10-18 DIAGNOSIS — E785 Hyperlipidemia, unspecified: Secondary | ICD-10-CM | POA: Diagnosis not present

## 2012-10-18 DIAGNOSIS — IMO0001 Reserved for inherently not codable concepts without codable children: Secondary | ICD-10-CM

## 2012-10-18 LAB — HEMOGLOBIN A1C: Hgb A1c MFr Bld: 7.9 % — ABNORMAL HIGH (ref 4.6–6.5)

## 2012-10-18 LAB — COMPREHENSIVE METABOLIC PANEL
ALT: 13 U/L (ref 0–53)
CO2: 31 mEq/L (ref 19–32)
Calcium: 9.2 mg/dL (ref 8.4–10.5)
Chloride: 99 mEq/L (ref 96–112)
GFR: 41.97 mL/min — ABNORMAL LOW (ref >60.00)
Glucose, Bld: 161 mg/dL — ABNORMAL HIGH (ref 70–99)
Sodium: 138 mEq/L (ref 135–145)
Total Bilirubin: 1.2 mg/dL (ref 0.3–1.2)
Total Protein: 7.3 g/dL (ref 6.0–8.3)

## 2012-10-18 LAB — URINALYSIS, ROUTINE W REFLEX MICROSCOPIC
Urine Glucose: NEGATIVE
WBC, UA: NONE SEEN (ref 0–?)

## 2012-10-18 LAB — LDL CHOLESTEROL, DIRECT: Direct LDL: 89.4 mg/dL

## 2012-10-18 LAB — LIPID PANEL
Cholesterol: 164 mg/dL (ref 0–200)
Total CHOL/HDL Ratio: 5
Triglycerides: 249 mg/dL — ABNORMAL HIGH (ref 0.0–149.0)

## 2012-10-18 LAB — MICROALBUMIN / CREATININE URINE RATIO
Creatinine,U: 68.2 mg/dL
Microalb Creat Ratio: 18.5 mg/g (ref 0.0–30.0)

## 2012-10-19 LAB — T4: T4, Total: 8.6 ug/dL (ref 5.0–12.5)

## 2012-10-20 ENCOUNTER — Encounter: Payer: Self-pay | Admitting: Endocrinology

## 2012-10-20 ENCOUNTER — Ambulatory Visit (INDEPENDENT_AMBULATORY_CARE_PROVIDER_SITE_OTHER): Payer: Medicare Other | Admitting: Endocrinology

## 2012-10-20 VITALS — BP 128/82 | HR 73 | Ht 69.0 in | Wt 222.4 lb

## 2012-10-20 DIAGNOSIS — R609 Edema, unspecified: Secondary | ICD-10-CM

## 2012-10-20 DIAGNOSIS — M109 Gout, unspecified: Secondary | ICD-10-CM

## 2012-10-20 DIAGNOSIS — R600 Localized edema: Secondary | ICD-10-CM | POA: Insufficient documentation

## 2012-10-20 DIAGNOSIS — E785 Hyperlipidemia, unspecified: Secondary | ICD-10-CM

## 2012-10-20 DIAGNOSIS — E039 Hypothyroidism, unspecified: Secondary | ICD-10-CM

## 2012-10-20 DIAGNOSIS — E1165 Type 2 diabetes mellitus with hyperglycemia: Secondary | ICD-10-CM | POA: Insufficient documentation

## 2012-10-20 NOTE — Patient Instructions (Addendum)
LANTUS 16 IN AM AND 20 AT SUPPER  HUMALOG 26 AT BFST AND 20 AT LUNCH AND SUPPER FOR Usual meals  Allopurinol 300, 1/2 daily

## 2012-10-20 NOTE — Progress Notes (Signed)
Patient ID: Mark [name], male   DOB: 1941/03/08, 72 y.o.   MRN: [id number]  Reason for Appointment: Diabetes follow-up   History of Present Illness   Diagnosis: Type 2 DIABETES MELITUS, long-standing and insulin-dependent     Oral hypoglycemic drugs: none        Insulin regimen: Humalog 22 tid before meals  Lantus 18 bid           Proper timing of medications in relation to meals: Yes, he'll inject right before eating when eating out.         Monitors blood glucose: Once a day.    Glucometer: FreeStyle        Blood Glucose readings: Glucometer download reviewed: readings before breakfast: recently 104-189, highest 210. Lunchtime 202-350, suppertime 62-90, P.C. supper 129 in the last 2 weeks Hypoglycemia frequency: occasionally at suppertime.          Meals: 3 meals per day. for breakfast he will have cereal  frequently      Physical activity: exercise: some  walking           Overall blood sugars are reasonably controlled although A1c is relatively high at 7.9. He has reduced all his doses of insulin significantly when he was having his diarrhea and his appetite was decreased. However he has not increased his evening Lantus back up to control fasting readings. Taking relatively high doses of mealtime insulin with inadequate control in the mornings for breakfast.  Assessment section has details of problems identified today.  Previously had also done well with Victoza which had helped weight control but he had some nausea and did not want to pay the cost  HYPERTENSION:  well-controlled  Lab on 10/18/2012  Component Date Value Range Status  . Hemoglobin A1C 10/18/2012 7.9* 4.6 - 6.5 % Final   Glycemic Control Guidelines for People with Diabetes:Non Diabetic:  <6%Goal of Therapy: <7%Additional Action Suggested:  >8%   . Microalb, Ur 10/18/2012 12.6* 0.0 - 1.9 mg/dL Final  . Creatinine,U 40/98/1191 68.2   Final  . Microalb Creat Ratio 10/18/2012 18.5  0.0 - 30.0 mg/g Final  . T4, Total  10/18/2012 8.6  5.0 - 12.5 ug/dL Final  . TSH 47/82/9562 4.82  0.35 - 5.50 uIU/mL Final  . Cholesterol 10/18/2012 164  0 - 200 mg/dL Final   ATP III Classification       Desirable:  < 200 mg/dL               Borderline High:  200 - 239 mg/dL          High:  > = 130 mg/dL  . Triglycerides 10/18/2012 249.0* 0.0 - 149.0 mg/dL Final   Normal:  <865 mg/dLBorderline High:  150 - 199 mg/dL  . HDL 10/18/2012 32.70* >39.00 mg/dL Final  . VLDL 78/46/9629 49.8* 0.0 - 40.0 mg/dL Final  . Total CHOL/HDL Ratio 10/18/2012 5   Final                  Men          Women1/2 Average Risk     3.4          3.3Average Risk          5.0          4.42X Average Risk          9.6          7.13X Average Risk  15.0          11.0                      . Sodium 10/18/2012 138  135 - 145 mEq/L Final  . Potassium 10/18/2012 3.9  3.5 - 5.1 mEq/L Final  . Chloride 10/18/2012 99  96 - 112 mEq/L Final  . CO2 10/18/2012 31  19 - 32 mEq/L Final  . Glucose, Bld 10/18/2012 161* 70 - 99 mg/dL Final  . BUN 16/01/9603 21  6 - 23 mg/dL Final  . Creatinine, Ser 10/18/2012 1.7* 0.4 - 1.5 mg/dL Final  . Total Bilirubin 10/18/2012 1.2  0.3 - 1.2 mg/dL Final  . Alkaline Phosphatase 10/18/2012 34* 39 - 117 U/L Final  . AST 10/18/2012 25  0 - 37 U/L Final  . ALT 10/18/2012 13  0 - 53 U/L Final  . Total Protein 10/18/2012 7.3  6.0 - 8.3 g/dL Final  . Albumin 54/12/8117 3.6  3.5 - 5.2 g/dL Final  . Calcium 14/78/2956 9.2  8.4 - 10.5 mg/dL Final  . GFR 21/30/8657 41.97* >60.00 mL/min Final  . Color, Urine 10/18/2012 LT. YELLOW  Yellow;Lt. Yellow Final  . APPearance 10/18/2012 CLEAR  Clear Final  . Specific Gravity, Urine 10/18/2012 1.015  1.000-1.030 Final  . pH 10/18/2012 6.5  5.0 - 8.0 Final  . Total Protein, Urine 10/18/2012 TRACE  Negative Final  . Urine Glucose 10/18/2012 NEGATIVE  Negative Final  . Ketones, ur 10/18/2012 NEGATIVE  Negative Final  . Bilirubin Urine 10/18/2012 NEGATIVE  Negative Final  . Hgb urine dipstick  10/18/2012 SMALL  Negative Final  . Urobilinogen, UA 10/18/2012 0.2  0.0 - 1.0 Final  . Leukocytes, UA 10/18/2012 NEGATIVE  Negative Final  . Nitrite 10/18/2012 NEGATIVE  Negative Final  . WBC, UA 10/18/2012 none seen  0-2/hpf Final  . RBC / HPF 10/18/2012 0-2/hpf  0-2/hpf Final  . Direct LDL 10/18/2012 89.4   Final   Optimal:  <100 mg/dLNear or Above Optimal:  100-129 mg/dLBorderline High:  130-159 mg/dLHigh:  160-189 mg/dLVery High:  >190 mg/dL      Medication List       This list is accurate as of: 10/20/12  4:07 PM.  Always use your most recent med list.               allopurinol 300 MG tablet  Commonly known as:  ZYLOPRIM  Take 1/2 by mouth daily     amoxicillin-clavulanate 875-125 MG per tablet  Commonly known as:  AUGMENTIN  Take 1 tablet by mouth 2 (two) times daily.     calcitRIOL 0.5 MCG capsule  Commonly known as:  ROCALTROL  Take one by mouth daily     DIGOX 0.125 MG tablet  Generic drug:  digoxin  Take one by mouth daily     fenofibrate micronized 134 MG capsule  Commonly known as:  LOFIBRA  Take one by mouth daily     furosemide 80 MG tablet  Commonly known as:  LASIX  Take 2 tabs by mouth every am and 1 every pm     HYDROcodone-acetaminophen 5-325 MG per tablet  Commonly known as:  NORCO/VICODIN  Take 1 by mouth every 4-6 hours as needed     insulin glargine 100 UNIT/ML injection  Commonly known as:  LANTUS  Inject 18 Units into the skin 2 (two) times daily.     insulin lispro 100 unit/ml Soln  Commonly known as:  HUMALOG  Inject into the skin. Use 26 units with meals subq     levothyroxine 88 MCG tablet  Commonly known as:  SYNTHROID, LEVOTHROID  Take one by mouth daily on empty stomach     metoprolol succinate 50 MG 24 hr tablet  Commonly known as:  TOPROL-XL  Take one by mouth daily     omeprazole 20 MG capsule  Commonly known as:  PRILOSEC  Take 20 mg by mouth daily.     oxyCODONE-acetaminophen 7.5-325 MG per tablet  Commonly  known as:  PERCOCET  Take 1 tablet by mouth.     potassium chloride SA 20 MEQ tablet  Commonly known as:  K-DUR,KLOR-CON  Take 2 by mouth two times a day     PROGRAF 0.5 MG capsule  Generic drug:  tacrolimus  Take 0.5 mg by mouth 2 (two) times daily.     RAPAMUNE 0.5 MG Tabs  Generic drug:  Sirolimus  Take by mouth daily.     rosuvastatin 10 MG tablet  Commonly known as:  CRESTOR  Take 10 mg by mouth daily.     SSD 1 % cream  Generic drug:  silver sulfADIAZINE  Apply to affected area twice a day     warfarin 5 MG tablet  Commonly known as:  COUMADIN  Take one by mouth daily        Allergies: No Known Allergies  Past Medical History  Diagnosis Date  . Diabetes   . Hypertension   . Gout   . Hypothyroidism     Past Surgical History  Procedure Laterality Date  . Melanoma excision    . Ankle fracture surgery    . Cataract extraction      No family history on file.  Social History:  reports that he has never smoked. He has never used smokeless tobacco. He reports that he does not drink alcohol or use illicit drugs.  Review of Systems - Cardiovascular ROS: positive for history of chronic redness No recent chest pain Edema well-controlled with large doses of Lasix His antibiotic induceddiarrhea has resolved and his appetite is back to normal Lost weightwith diarrhea, 4 lbs Mild hypothyroidism, stable on 88 mcg supplement, TSH 4.8 now, compliant with supplement   Examination:   BP 128/82  Pulse 73  Ht 5\' 9"  (1.753 m)  Wt 222 lb 6.4 oz (100.88 kg)  BMI 32.83 kg/m2  SpO2 97%  Body mass index is 32.83 kg/(m^2).   Assesment/ PLAN:  DIABETES: Although his control is overall fairly good he is having significantly high readings midday and occasionally in the morning also. He has been adjusting his insulin arbitrarily on his own but is not following the instructions for adjusting evening Lantus based on fasting readings, however since he is using the same dose  b.i.d. he is having high readings in the morning and low readings around supper time Other problems: 1. Probably getting excessive Lantus in the morning since his sugars at suppertime have been low-normal with lowest reading 62 2. Not getting enough insulin to cover his breakfast and his blood sugars at lunchtime have been consistently over 200 3. Eating cereal at breakfast and other high carbohydrate meals usually which caused higher readings at lunchtime 4. Not getting enough exercise 5. Not adjusting mealtime insulin based on based on glucose patterns and amount of carbohydrates but taking the same dose for every meal 6. Difficult to assess suppertime insulin does as he has only one reading after meal recently  HYPOTHYROIDISM: This  is controlled but TSH is high normal, because of previous cardiac arrhythmia history will not increase the dosage is yet, continue to monitor HYPERURICEMIA: He has not had a recurrence of his gout with U[facility] but he wants to go back to her now because of the cost, this is regular to try and will monitor urine tested again HYPERLIPIDEMIA: Still has relatively high triglycerides despite taking full doses of fenofibrate and also a statin drug; did not tolerate niacin previously; LDL well controlled at 89 and HDL still relatively low  Counseling time over 50% of today is 25 minute visit  Franci Oshana 10/20/2012, 4:07 PM

## 2012-10-24 ENCOUNTER — Other Ambulatory Visit: Payer: Medicare Other

## 2012-11-08 DIAGNOSIS — Z940 Kidney transplant status: Secondary | ICD-10-CM | POA: Diagnosis not present

## 2012-11-08 DIAGNOSIS — N2581 Secondary hyperparathyroidism of renal origin: Secondary | ICD-10-CM | POA: Diagnosis not present

## 2012-11-08 DIAGNOSIS — Z79899 Other long term (current) drug therapy: Secondary | ICD-10-CM | POA: Diagnosis not present

## 2012-11-09 ENCOUNTER — Other Ambulatory Visit: Payer: Self-pay

## 2012-11-09 DIAGNOSIS — Z7901 Long term (current) use of anticoagulants: Secondary | ICD-10-CM | POA: Diagnosis not present

## 2012-11-09 DIAGNOSIS — I4891 Unspecified atrial fibrillation: Secondary | ICD-10-CM | POA: Diagnosis not present

## 2012-11-21 ENCOUNTER — Encounter (HOSPITAL_BASED_OUTPATIENT_CLINIC_OR_DEPARTMENT_OTHER): Payer: Medicare Other | Attending: General Surgery

## 2012-11-21 DIAGNOSIS — IMO0002 Reserved for concepts with insufficient information to code with codable children: Secondary | ICD-10-CM | POA: Diagnosis not present

## 2012-11-21 DIAGNOSIS — I10 Essential (primary) hypertension: Secondary | ICD-10-CM | POA: Insufficient documentation

## 2012-11-21 DIAGNOSIS — Z940 Kidney transplant status: Secondary | ICD-10-CM | POA: Diagnosis not present

## 2012-11-21 DIAGNOSIS — E119 Type 2 diabetes mellitus without complications: Secondary | ICD-10-CM | POA: Diagnosis not present

## 2012-11-21 DIAGNOSIS — X58XXXA Exposure to other specified factors, initial encounter: Secondary | ICD-10-CM | POA: Insufficient documentation

## 2012-11-21 NOTE — H&P (Signed)
Mark [name], Mark [name]             ACCOUNT NO.:  [id number]  MEDICAL RECORD NO.:  [id number]  LOCATION:  FOOT                         FACILITY:  [facility]  PHYSICIAN:  Joanne Gavel, M.D.        DATE OF BIRTH:  [birth year]  DATE OF ADMISSION:  11/21/2012 DATE OF DISCHARGE:                             HISTORY & PHYSICAL   CHIEF COMPLAINT:  A blister right leg.  HISTORY OF PRESENT ILLNESS:  This is a 72 year old male diabetic for several decades who was discharged from the clinic in June 2014 for treatment of a diabetic ulcer of the ankle several days ago he developed a blister which broke and he came to the clinic with some concern because of his previous history.  PAST MEDICAL HISTORY:  Reveals treatment for melanoma, ankle fracture, diabetes, hypertension, gout, and hypothyroidism.  Also, there is a history of renal failure, status post kidney transplant.  PAST SURGICAL HISTORY:  Includes melanoma excision, ankle fracture surgery, cataract extraction.  SOCIAL HISTORY:  Cigarettes:  None.  Alcohol:  None.  MEDICATIONS:  Allopurinol, Calcitrol, digoxin, Lasix, Vicodin, Lantus, Humalog, Synthroid, Toprol-XL, Prilosec, Percocet, tacrolimus, sirolimus, Crestor, Coumadin.  ALLERGIES:  None.  REVIEW OF SYSTEMS:  As above.  PHYSICAL EXAMINATION:  VITAL SIGNS:  Temperature 97.6, pulse 60, respiration 18, blood pressure 138/80. GENERAL:  Well developed, well nourished, in no distress. HEAD:  Normocephalic. EYES, NOSE, THROAT:  Normal. CHEST:  Clear. HEART:  Regular rhythm. EXTREMITIES:  Reveals an ABI of 1.  There is some minor stasis changes, just above the ankle.  There is a site of a healed blister.  There are no open wounds.  IMPRESSION:  Diabetic patient, status post renal transplant with no diabetic wounds at present.  PLAN:  See p.r.n.     Joanne Gavel, M.D.     RA/MEDQ  D:  11/21/2012  T:  11/21/2012  Job:  [id number]

## 2012-12-08 DIAGNOSIS — Z7901 Long term (current) use of anticoagulants: Secondary | ICD-10-CM | POA: Diagnosis not present

## 2012-12-08 DIAGNOSIS — I4891 Unspecified atrial fibrillation: Secondary | ICD-10-CM | POA: Diagnosis not present

## 2012-12-14 DIAGNOSIS — D751 Secondary polycythemia: Secondary | ICD-10-CM | POA: Diagnosis not present

## 2012-12-14 DIAGNOSIS — Z79899 Other long term (current) drug therapy: Secondary | ICD-10-CM | POA: Diagnosis not present

## 2012-12-14 DIAGNOSIS — N185 Chronic kidney disease, stage 5: Secondary | ICD-10-CM | POA: Diagnosis not present

## 2012-12-14 DIAGNOSIS — Z940 Kidney transplant status: Secondary | ICD-10-CM | POA: Diagnosis not present

## 2012-12-15 DIAGNOSIS — Z7901 Long term (current) use of anticoagulants: Secondary | ICD-10-CM | POA: Diagnosis not present

## 2012-12-15 DIAGNOSIS — I4891 Unspecified atrial fibrillation: Secondary | ICD-10-CM | POA: Diagnosis not present

## 2012-12-15 DIAGNOSIS — N2581 Secondary hyperparathyroidism of renal origin: Secondary | ICD-10-CM | POA: Diagnosis not present

## 2012-12-15 DIAGNOSIS — Z79899 Other long term (current) drug therapy: Secondary | ICD-10-CM | POA: Diagnosis not present

## 2012-12-15 DIAGNOSIS — Z940 Kidney transplant status: Secondary | ICD-10-CM | POA: Diagnosis not present

## 2012-12-19 ENCOUNTER — Other Ambulatory Visit: Payer: Medicare Other

## 2012-12-22 ENCOUNTER — Other Ambulatory Visit: Payer: Self-pay | Admitting: *Deleted

## 2012-12-22 ENCOUNTER — Other Ambulatory Visit (INDEPENDENT_AMBULATORY_CARE_PROVIDER_SITE_OTHER): Payer: Medicare Other | Admitting: *Deleted

## 2012-12-22 ENCOUNTER — Encounter: Payer: Self-pay | Admitting: Endocrinology

## 2012-12-22 ENCOUNTER — Ambulatory Visit (INDEPENDENT_AMBULATORY_CARE_PROVIDER_SITE_OTHER): Payer: Medicare Other | Admitting: Endocrinology

## 2012-12-22 VITALS — BP 130/74 | HR 88 | Temp 98.5°F | Resp 12 | Ht 69.0 in | Wt 226.5 lb

## 2012-12-22 DIAGNOSIS — E785 Hyperlipidemia, unspecified: Secondary | ICD-10-CM

## 2012-12-22 DIAGNOSIS — Z23 Encounter for immunization: Secondary | ICD-10-CM

## 2012-12-22 DIAGNOSIS — E039 Hypothyroidism, unspecified: Secondary | ICD-10-CM | POA: Diagnosis not present

## 2012-12-22 MED ORDER — ROSUVASTATIN CALCIUM 20 MG PO TABS: ORAL_TABLET | ORAL | Status: DC

## 2012-12-22 NOTE — Patient Instructions (Addendum)
Lantus 20 in am and 18 in pm  Please check blood sugars at least half the time about 2 hours after any meal and as directed on waking up. Please bring blood sugar monitor to each visit

## 2012-12-22 NOTE — Progress Notes (Signed)
Patient ID: Mark [name], male   DOB: 03/13/1941, 72 y.o.   MRN: [id number]  Reason for Appointment: Diabetes follow-up   History of Present Illness   Diagnosis: Type 2 DIABETES MELITUS, long-standing and insulin-dependent     Oral hypoglycemic drugs: none        Insulin regimen: Humalog 28-30 tid before meals  Lantus 18 a.m.-16 p.m.  He has been on basal bolus insulin regimen for several years. Previously had been taking much larger doses of Lantus but is now requiring less since he had lost weight and had intercurrent illnesses  Overall blood sugars are only fairly controlled, last A1c was relatively high at 7.9. Since he has been eating better he has gone up significantly on his mealtime doses but has not increased Lantus much Because of low normal sugars at suppertime his morning Lantus was reducedon the last visit However he is having high sugars at lunch and supper periodically which he thinks is related to variable diet Not clear why his morning sugars are periodically high and he does not do any readings after supper.  Previously had also done well with Victoza which had helped weight control and glucose but he had some nausea and did not want to pay the cost Proper timing of medications in relation to meals: Yes, he does inject Humalog right before eating when eating out.          Monitors blood glucose: 2 times a day.    Glucometer: FreeStyle        Blood Glucose readings: Glucometer download reviewed: readings before breakfast: 92-299, recently better an average 160. Lunchtime average 170 and around suppertime average 180, very few readings at suppertime Hypoglycemia frequency: none recently          Meals: 3 meals per day. for breakfast he will have cereal but sometimes may be high fat    Physical activity: exercise: some walking            No visits with results within 1 Week(s) from this visit. Latest known visit with results is:  Lab on 10/18/2012  Component Date  Value Range Status  . Hemoglobin A1C 10/18/2012 7.9* 4.6 - 6.5 % Final   Glycemic Control Guidelines for People with Diabetes:Non Diabetic:  <6%Goal of Therapy: <7%Additional Action Suggested:  >8%   . Microalb, Ur 10/18/2012 12.6* 0.0 - 1.9 mg/dL Final  . Creatinine,U 14/78/2956 68.2   Final  . Microalb Creat Ratio 10/18/2012 18.5  0.0 - 30.0 mg/g Final  . T4, Total 10/18/2012 8.6  5.0 - 12.5 ug/dL Final  . TSH 21/30/8657 4.82  0.35 - 5.50 uIU/mL Final  . Cholesterol 10/18/2012 164  0 - 200 mg/dL Final   ATP III Classification       Desirable:  < 200 mg/dL               Borderline High:  200 - 239 mg/dL          High:  > = 846 mg/dL  . Triglycerides 10/18/2012 249.0* 0.0 - 149.0 mg/dL Final   Normal:  <962 mg/dLBorderline High:  150 - 199 mg/dL  . HDL 10/18/2012 32.70* >39.00 mg/dL Final  . VLDL 95/28/4132 49.8* 0.0 - 40.0 mg/dL Final  . Total CHOL/HDL Ratio 10/18/2012 5   Final                  Men          Women1/2 Average Risk  3.4          3.3Average Risk          5.0          4.42X Average Risk          9.6          7.13X Average Risk          15.0          11.0                      . Sodium 10/18/2012 138  135 - 145 mEq/L Final  . Potassium 10/18/2012 3.9  3.5 - 5.1 mEq/L Final  . Chloride 10/18/2012 99  96 - 112 mEq/L Final  . CO2 10/18/2012 31  19 - 32 mEq/L Final  . Glucose, Bld 10/18/2012 161* 70 - 99 mg/dL Final  . BUN 98/02/9146 21  6 - 23 mg/dL Final  . Creatinine, Ser 10/18/2012 1.7* 0.4 - 1.5 mg/dL Final  . Total Bilirubin 10/18/2012 1.2  0.3 - 1.2 mg/dL Final  . Alkaline Phosphatase 10/18/2012 34* 39 - 117 U/L Final  . AST 10/18/2012 25  0 - 37 U/L Final  . ALT 10/18/2012 13  0 - 53 U/L Final  . Total Protein 10/18/2012 7.3  6.0 - 8.3 g/dL Final  . Albumin 82/95/6213 3.6  3.5 - 5.2 g/dL Final  . Calcium 08/65/7846 9.2  8.4 - 10.5 mg/dL Final  . GFR 96/29/5284 41.97* >60.00 mL/min Final  . Color, Urine 10/18/2012 LT. YELLOW  Yellow;Lt. Yellow Final  . APPearance  10/18/2012 CLEAR  Clear Final  . Specific Gravity, Urine 10/18/2012 1.015  1.000-1.030 Final  . pH 10/18/2012 6.5  5.0 - 8.0 Final  . Total Protein, Urine 10/18/2012 TRACE  Negative Final  . Urine Glucose 10/18/2012 NEGATIVE  Negative Final  . Ketones, ur 10/18/2012 NEGATIVE  Negative Final  . Bilirubin Urine 10/18/2012 NEGATIVE  Negative Final  . Hgb urine dipstick 10/18/2012 SMALL  Negative Final  . Urobilinogen, UA 10/18/2012 0.2  0.0 - 1.0 Final  . Leukocytes, UA 10/18/2012 NEGATIVE  Negative Final  . Nitrite 10/18/2012 NEGATIVE  Negative Final  . WBC, UA 10/18/2012 none seen  0-2/hpf Final  . RBC / HPF 10/18/2012 0-2/hpf  0-2/hpf Final  . Direct LDL 10/18/2012 89.4   Final   Optimal:  <100 mg/dLNear or Above Optimal:  100-129 mg/dLBorderline High:  130-159 mg/dLHigh:  160-189 mg/dLVery High:  >190 mg/dL      Medication List       This list is accurate as of: 12/22/12  3:56 PM.  Always use your most recent med list.               allopurinol 300 MG tablet  Commonly known as:  ZYLOPRIM  Take 1/2 by mouth daily     amoxicillin-clavulanate 875-125 MG per tablet  Commonly known as:  AUGMENTIN  Take 1 tablet by mouth 2 (two) times daily.     calcitRIOL 0.5 MCG capsule  Commonly known as:  ROCALTROL  Take one by mouth daily     DIGOX 0.125 MG tablet  Generic drug:  digoxin  Take one by mouth daily     fenofibrate micronized 134 MG capsule  Commonly known as:  LOFIBRA  Take one by mouth daily     furosemide 80 MG tablet  Commonly known as:  LASIX  Take 2 tabs by mouth every am and 1 every pm  HYDROcodone-acetaminophen 5-325 MG per tablet  Commonly known as:  NORCO/VICODIN  Take 1 by mouth every 4-6 hours as needed     insulin glargine 100 UNIT/ML injection  Commonly known as:  LANTUS  Inject 18 Units into the skin 2 (two) times daily.     insulin lispro 100 unit/ml Soln  Commonly known as:  HUMALOG  Inject into the skin. Use 26 units with meals subq      levothyroxine 88 MCG tablet  Commonly known as:  SYNTHROID, LEVOTHROID  Take one by mouth daily on empty stomach     metoprolol succinate 50 MG 24 hr tablet  Commonly known as:  TOPROL-XL  Take one by mouth daily     nystatin 100000 UNIT/GM Powd     omeprazole 20 MG capsule  Commonly known as:  PRILOSEC  Take 20 mg by mouth daily.     oxyCODONE-acetaminophen 7.5-325 MG per tablet  Commonly known as:  PERCOCET  Take 1 tablet by mouth.     potassium chloride SA 20 MEQ tablet  Commonly known as:  K-DUR,KLOR-CON  Take 2 by mouth two times a day     PROGRAF 0.5 MG capsule  Generic drug:  tacrolimus  Take 0.5 mg by mouth 2 (two) times daily.     RAPAMUNE 0.5 MG Tabs  Generic drug:  Sirolimus  Take by mouth daily.     rosuvastatin 20 MG tablet  Commonly known as:  CRESTOR  Take 1 tablet daily     SSD 1 % cream  Generic drug:  silver sulfADIAZINE  Apply to affected area twice a day     warfarin 5 MG tablet  Commonly known as:  COUMADIN  Take one by mouth daily        Allergies: No Known Allergies  Past Medical History  Diagnosis Date  . Diabetes   . Hypertension   . Gout   . Hypothyroidism     Past Surgical History  Procedure Laterality Date  . Melanoma excision    . Ankle fracture surgery    . Cataract extraction      No family history on file.  Social History:  reports that he has never smoked. He has never used smokeless tobacco. He reports that he does not drink alcohol or use illicit drugs.  Review of Systems - Cardiovascular ROS: positive for history of chronic AF Edema well-controlled with large doses of Lasix His antibiotic induced diarrhea has not resolved but his appetite is back to normal Mild hypothyroidism, stable on 88 mcg supplement, TSH 4.8 , compliant with supplement   Examination:   BP 130/74  Pulse 88  Temp(Src) 98.5 F (36.9 C)  Resp 12  Ht 5\' 9"  (1.753 m)  Wt 226 lb 8 oz (102.74 kg)  BMI 33.43 kg/m2  SpO2 98%  Body mass  index is 33.43 kg/(m^2).   Assesment/ PLAN:  DIABETES: Again his control is not consistent and average home reading is 172 recently Periodically he is having significantly high readings midday and occasionally in the morning also. He has been adjusting his insulin arbitrarily on his own but is not following the instructions for adjusting evening Lantus based on fasting readings, Difficult to assess suppertime insulin does as he has only one reading after meal recently   [facility] 12/22/2012, 3:56 PM

## 2012-12-28 ENCOUNTER — Telehealth: Payer: Self-pay | Admitting: Endocrinology

## 2012-12-31 ENCOUNTER — Other Ambulatory Visit: Payer: Self-pay | Admitting: Endocrinology

## 2013-01-03 ENCOUNTER — Other Ambulatory Visit: Payer: Self-pay | Admitting: *Deleted

## 2013-01-03 DIAGNOSIS — H27119 Subluxation of lens, unspecified eye: Secondary | ICD-10-CM | POA: Diagnosis not present

## 2013-01-03 DIAGNOSIS — Z79899 Other long term (current) drug therapy: Secondary | ICD-10-CM | POA: Diagnosis not present

## 2013-01-03 DIAGNOSIS — E11359 Type 2 diabetes mellitus with proliferative diabetic retinopathy without macular edema: Secondary | ICD-10-CM | POA: Diagnosis not present

## 2013-01-03 DIAGNOSIS — H35379 Puckering of macula, unspecified eye: Secondary | ICD-10-CM | POA: Diagnosis not present

## 2013-01-03 DIAGNOSIS — Z794 Long term (current) use of insulin: Secondary | ICD-10-CM | POA: Diagnosis not present

## 2013-01-03 DIAGNOSIS — Z940 Kidney transplant status: Secondary | ICD-10-CM | POA: Diagnosis not present

## 2013-01-03 MED ORDER — INSULIN LISPRO 100 UNIT/ML (KWIKPEN): 26.0000 [IU] | PEN_INJECTOR | Freq: Three times a day (TID) | SUBCUTANEOUS | Status: DC

## 2013-01-05 DIAGNOSIS — N189 Chronic kidney disease, unspecified: Secondary | ICD-10-CM | POA: Diagnosis not present

## 2013-01-05 DIAGNOSIS — R7989 Other specified abnormal findings of blood chemistry: Secondary | ICD-10-CM | POA: Diagnosis not present

## 2013-01-05 DIAGNOSIS — I10 Essential (primary) hypertension: Secondary | ICD-10-CM | POA: Diagnosis not present

## 2013-01-05 DIAGNOSIS — E7800 Pure hypercholesterolemia, unspecified: Secondary | ICD-10-CM | POA: Diagnosis not present

## 2013-01-05 DIAGNOSIS — I4891 Unspecified atrial fibrillation: Secondary | ICD-10-CM | POA: Diagnosis not present

## 2013-01-05 DIAGNOSIS — Z79899 Other long term (current) drug therapy: Secondary | ICD-10-CM | POA: Diagnosis not present

## 2013-01-05 DIAGNOSIS — R197 Diarrhea, unspecified: Secondary | ICD-10-CM | POA: Diagnosis not present

## 2013-01-05 DIAGNOSIS — E119 Type 2 diabetes mellitus without complications: Secondary | ICD-10-CM | POA: Diagnosis not present

## 2013-01-10 DIAGNOSIS — R197 Diarrhea, unspecified: Secondary | ICD-10-CM | POA: Diagnosis not present

## 2013-01-12 DIAGNOSIS — Z7901 Long term (current) use of anticoagulants: Secondary | ICD-10-CM | POA: Diagnosis not present

## 2013-01-12 DIAGNOSIS — I4891 Unspecified atrial fibrillation: Secondary | ICD-10-CM | POA: Diagnosis not present

## 2013-01-17 DIAGNOSIS — Z79899 Other long term (current) drug therapy: Secondary | ICD-10-CM | POA: Diagnosis not present

## 2013-01-17 DIAGNOSIS — Z940 Kidney transplant status: Secondary | ICD-10-CM | POA: Diagnosis not present

## 2013-01-17 DIAGNOSIS — N2581 Secondary hyperparathyroidism of renal origin: Secondary | ICD-10-CM | POA: Diagnosis not present

## 2013-01-18 ENCOUNTER — Other Ambulatory Visit: Payer: Self-pay | Admitting: *Deleted

## 2013-01-18 MED ORDER — INSULIN GLARGINE 100 UNIT/ML SOLOSTAR PEN: 18.0000 [IU] | PEN_INJECTOR | Freq: Two times a day (BID) | SUBCUTANEOUS | Status: DC

## 2013-01-19 ENCOUNTER — Other Ambulatory Visit: Payer: Self-pay | Admitting: Endocrinology

## 2013-01-19 ENCOUNTER — Telehealth: Payer: Self-pay | Admitting: Endocrinology

## 2013-01-19 NOTE — Telephone Encounter (Signed)
Results given to patient

## 2013-01-19 NOTE — Telephone Encounter (Signed)
A1c on 01/17/13 is 8.6. Please tell the patient to make sure he is checking readings after supper also and let me know if they are consistently high

## 2013-02-09 DIAGNOSIS — Z7901 Long term (current) use of anticoagulants: Secondary | ICD-10-CM | POA: Diagnosis not present

## 2013-02-09 DIAGNOSIS — I4891 Unspecified atrial fibrillation: Secondary | ICD-10-CM | POA: Diagnosis not present

## 2013-02-09 DIAGNOSIS — I359 Nonrheumatic aortic valve disorder, unspecified: Secondary | ICD-10-CM | POA: Diagnosis not present

## 2013-02-10 ENCOUNTER — Other Ambulatory Visit: Payer: Self-pay | Admitting: *Deleted

## 2013-02-10 ENCOUNTER — Other Ambulatory Visit: Payer: Self-pay | Admitting: Endocrinology

## 2013-02-10 MED ORDER — GLUCOSE BLOOD VI STRP: ORAL_STRIP | Status: DC

## 2013-02-16 DIAGNOSIS — Z79899 Other long term (current) drug therapy: Secondary | ICD-10-CM | POA: Diagnosis not present

## 2013-02-16 DIAGNOSIS — E119 Type 2 diabetes mellitus without complications: Secondary | ICD-10-CM | POA: Diagnosis not present

## 2013-02-16 DIAGNOSIS — I509 Heart failure, unspecified: Secondary | ICD-10-CM | POA: Diagnosis not present

## 2013-02-16 DIAGNOSIS — E785 Hyperlipidemia, unspecified: Secondary | ICD-10-CM | POA: Diagnosis not present

## 2013-02-16 DIAGNOSIS — I4891 Unspecified atrial fibrillation: Secondary | ICD-10-CM | POA: Diagnosis not present

## 2013-02-16 DIAGNOSIS — I5030 Unspecified diastolic (congestive) heart failure: Secondary | ICD-10-CM | POA: Diagnosis not present

## 2013-02-16 DIAGNOSIS — I110 Hypertensive heart disease with heart failure: Secondary | ICD-10-CM | POA: Diagnosis not present

## 2013-02-16 DIAGNOSIS — I739 Peripheral vascular disease, unspecified: Secondary | ICD-10-CM | POA: Diagnosis not present

## 2013-03-08 DIAGNOSIS — Z79899 Other long term (current) drug therapy: Secondary | ICD-10-CM | POA: Diagnosis not present

## 2013-03-08 DIAGNOSIS — Z7901 Long term (current) use of anticoagulants: Secondary | ICD-10-CM | POA: Diagnosis not present

## 2013-03-08 DIAGNOSIS — E785 Hyperlipidemia, unspecified: Secondary | ICD-10-CM | POA: Diagnosis not present

## 2013-03-08 DIAGNOSIS — I4891 Unspecified atrial fibrillation: Secondary | ICD-10-CM | POA: Diagnosis not present

## 2013-03-08 DIAGNOSIS — I359 Nonrheumatic aortic valve disorder, unspecified: Secondary | ICD-10-CM | POA: Diagnosis not present

## 2013-03-08 DIAGNOSIS — R0602 Shortness of breath: Secondary | ICD-10-CM | POA: Diagnosis not present

## 2013-03-09 DIAGNOSIS — Z48298 Encounter for aftercare following other organ transplant: Secondary | ICD-10-CM | POA: Diagnosis not present

## 2013-03-09 DIAGNOSIS — Z794 Long term (current) use of insulin: Secondary | ICD-10-CM | POA: Diagnosis not present

## 2013-03-09 DIAGNOSIS — Z79899 Other long term (current) drug therapy: Secondary | ICD-10-CM | POA: Diagnosis not present

## 2013-03-09 DIAGNOSIS — E11359 Type 2 diabetes mellitus with proliferative diabetic retinopathy without macular edema: Secondary | ICD-10-CM | POA: Diagnosis not present

## 2013-03-09 DIAGNOSIS — Z940 Kidney transplant status: Secondary | ICD-10-CM | POA: Diagnosis not present

## 2013-03-09 DIAGNOSIS — I4891 Unspecified atrial fibrillation: Secondary | ICD-10-CM | POA: Diagnosis not present

## 2013-03-09 DIAGNOSIS — E119 Type 2 diabetes mellitus without complications: Secondary | ICD-10-CM | POA: Diagnosis not present

## 2013-03-09 DIAGNOSIS — E785 Hyperlipidemia, unspecified: Secondary | ICD-10-CM | POA: Diagnosis not present

## 2013-03-16 ENCOUNTER — Other Ambulatory Visit (INDEPENDENT_AMBULATORY_CARE_PROVIDER_SITE_OTHER): Payer: Medicare Other

## 2013-03-16 DIAGNOSIS — I4891 Unspecified atrial fibrillation: Secondary | ICD-10-CM | POA: Diagnosis not present

## 2013-03-23 ENCOUNTER — Ambulatory Visit (INDEPENDENT_AMBULATORY_CARE_PROVIDER_SITE_OTHER): Payer: Medicare Other | Admitting: Endocrinology

## 2013-03-23 ENCOUNTER — Encounter: Payer: Self-pay | Admitting: Endocrinology

## 2013-03-23 VITALS — BP 128/76 | HR 79 | Temp 98.2°F | Resp 12 | Ht 69.0 in | Wt 226.1 lb

## 2013-03-23 DIAGNOSIS — I4891 Unspecified atrial fibrillation: Secondary | ICD-10-CM | POA: Diagnosis not present

## 2013-03-23 DIAGNOSIS — Z7901 Long term (current) use of anticoagulants: Secondary | ICD-10-CM | POA: Diagnosis not present

## 2013-03-23 DIAGNOSIS — E039 Hypothyroidism, unspecified: Secondary | ICD-10-CM | POA: Diagnosis not present

## 2013-03-23 NOTE — Progress Notes (Signed)
Patient ID: Mark [name], male   DOB: 30-Jul-1940, 72 y.o.   MRN: [id number]  Reason for Appointment: Diabetes follow-up   History of Present Illness   Diagnosis: Type 2 DIABETES MELITUS, long-standing and insulin-dependent       He has been on basal bolus insulin regimen for several years. Previously had been taking much larger doses of Lantus but is now requiring less since he had lost weight and had intercurrent illnesses He had done very well with Victoza in the past which had helped weight control and glucose but he had some nausea and did not want to pay the cost  Recent history: Overall blood sugars are relatively high as judged by his A1c and home average blood sugar of 181 His A1c has been progressively increasing although his blood sugars are usually good in the morning they are going higher this week He takes relatively larger amounts of mealtime coverage compared to Lantus Blood sugars are the highest at bedtime and occasionally after lunch also He tends to have relatively high reading with eating cereal in the morning Not clear why his morning sugars are recently high, over 200 for the last 3 days. Bedtime readings are not consistently high He thinks he may be requiring more Humalog compared to NovoLog which was seen by insurance company    Insulin regimen: Humalog 28-30 tid before meals  Lantus 18 a.m.-20 p.m. Oral hypoglycemic drugs: none     Proper timing of medications in relation to meals: Yes, he does inject Humalog right before eating when eating out.          Monitors blood glucose: 2 times a day.    Glucometer: FreeStyle        Blood Glucose readings: Glucometer download reviewed:   PREMEAL Breakfast Lunch Dinner Bedtime Overall  Glucose range:  111-237   67-361   121, 151  154-316   Mean/median:  169      220   181    Hypoglycemia frequency:  minimal recently, lowest reading 67 at 1 PM           Meals: 3 meals per day. for breakfast he will have cereal but  sometimes may be high fat; eating out once a day, no snacks    Physical activity: exercise: some walking           Wt Readings from Last 3 Encounters:  03/23/13 226 lb 1.6 oz (102.558 kg)  12/22/12 226 lb 8 oz (102.74 kg)  10/20/12 222 lb 6.4 oz (100.88 kg)    Lab Results  Component Value Date   HGBA1C 8.9* 03/16/2013   HGBA1C 7.9* 10/18/2012   Lab Results  Component Value Date   MICROALBUR 12.6* 10/18/2012   CREATININE 1.7* 10/18/2012        Medication List       This list is accurate as of: 03/23/13  2:24 PM.  Always use your most recent med list.               allopurinol 300 MG tablet  Commonly known as:  ZYLOPRIM  TAKE 1/2 TABLET BY MOUTH ONCE DAILY.     calcitRIOL 0.5 MCG capsule  Commonly known as:  ROCALTROL  Take one by mouth daily     DIGOX 0.125 MG tablet  Generic drug:  digoxin  Take one by mouth daily     fenofibrate micronized 134 MG capsule  Commonly known as:  LOFIBRA  Take one by mouth daily  furosemide 80 MG tablet  Commonly known as:  LASIX  Take 2 tabs by mouth every am and 1 every pm     glucose blood test strip  Commonly known as:  FREESTYLE LITE  Use as instructed to check blood sugars 4 times per day dx code 250.02     HYDROcodone-acetaminophen 5-325 MG per tablet  Commonly known as:  NORCO/VICODIN  Take 1 by mouth every 4-6 hours as needed     Insulin Glargine 100 UNIT/ML Sopn  Commonly known as:  LANTUS SOLOSTAR  Inject 18 Units into the skin 2 (two) times daily.     HUMALOG KWIKPEN 100 UNIT/ML Sopn  Generic drug:  insulin lispro  INJECT 26 UNITS INTO THE SKIN THREE TIMES DAILY WITH MEALS     insulin lispro 100 unit/ml Soln  Commonly known as:  HUMALOG  Inject into the skin. Use 26 units with meals subq     levothyroxine 88 MCG tablet  Commonly known as:  SYNTHROID, LEVOTHROID  Take one by mouth daily on empty stomach     metoprolol succinate 50 MG 24 hr tablet  Commonly known as:  TOPROL-XL  Take one by mouth  daily     nystatin 100000 UNIT/GM Powd     omeprazole 20 MG capsule  Commonly known as:  PRILOSEC  Take 20 mg by mouth daily.     oxyCODONE-acetaminophen 7.5-325 MG per tablet  Commonly known as:  PERCOCET  Take 1 tablet by mouth.     potassium chloride SA 20 MEQ tablet  Commonly known as:  K-DUR,KLOR-CON  Take 2 by mouth two times a day     PROGRAF 0.5 MG capsule  Generic drug:  tacrolimus  Take 0.5 mg by mouth 2 (two) times daily.     RAPAMUNE 0.5 MG Tabs  Generic drug:  Sirolimus  Take by mouth daily.     rosuvastatin 20 MG tablet  Commonly known as:  CRESTOR  Take 1 tablet daily     SSD 1 % cream  Generic drug:  silver sulfADIAZINE  Apply to affected area twice a day     warfarin 5 MG tablet  Commonly known as:  COUMADIN  Take one by mouth daily        Allergies: No Known Allergies  Past Medical History  Diagnosis Date  . Diabetes   . Hypertension   . Gout   . Hypothyroidism     Past Surgical History  Procedure Laterality Date  . Melanoma excision    . Ankle fracture surgery    . Cataract extraction      No family history on file.  Social History:  reports that he has never smoked. He has never used smokeless tobacco. He reports that he does not drink alcohol or use illicit drugs.  Review of Systems  Cardiovascular ROS: positive for history of chronic AF Edema well-controlled with large doses of Lasix Mild hypothyroidism, previously stable on 88 mcg supplement; compliant with supplement; no recent fatigue  Lab Results  Component Value Date   TSH 4.82 10/18/2012      Examination:   BP 128/76  Pulse 79  Temp(Src) 98.2 F (36.8 C)  Resp 12  Ht 5\' 9"  (1.753 m)  Wt 226 lb 1.6 oz (102.558 kg)  BMI 33.37 kg/m2  SpO2 96%  Body mass index is 33.37 kg/(m^2).   Assesment/ PLAN:  DIABETES: His blood sugars are overall not well controlled with significantly high A1c He has variability in his  blood sugars including fasting for no apparent  reason He has not been able to get good control with basal bolus insulin regimen and difficulty adjusting mealtime coverage for various types of meals Also has difficulty checking blood sugars consistently postprandially Since he previously had done quite well with VICTOZA and is taking large amounts of mealtime doses we'll try this again Discussed using samples to start with and then getting on the assistance program with the company if needed Discussed how this works, doses titration, possible side effects, and effects on satiety He will cut back on the coverage for his breakfast since glucose readings are the lowest at lunchtime He needs to monitor more readings in the next few days Discussed cutting back on other insulin doses by 4-6 units if Victoza effective Since fasting readings are relatively high will increase his evening Lantus dose to 24  HYPERTENSION: Mild and well controlled now with metoprolol  Hypothyroidism: Will check his TSH on the next visit in followup  Counseling time over 50% of today's 25 minute visit  Laird Runnion 03/23/2013, 2:24 PM

## 2013-03-23 NOTE — Patient Instructions (Signed)
Please check blood sugars at least half the time about 2 hours after any meal and as directed on waking up. Please bring blood sugar monitor to each visit  Start VICTOZA injection with the sample pen once daily at the same time of the day.  Dial the dose to 0.6 mg for the first 4-6 days.  You may  experience nausea in the first few days which usually gets better the After 1 week increase the dose to 1.2mg  daily if no nausea.  You may inject in the stomach, thigh or arm.   You will feel fullness of the stomach with starting the medication and should try to keep portions of food small.    LANTUS 24 in pm   Humalog 24 units in ams

## 2013-03-25 ENCOUNTER — Other Ambulatory Visit: Payer: Self-pay | Admitting: Endocrinology

## 2013-04-11 DIAGNOSIS — I4891 Unspecified atrial fibrillation: Secondary | ICD-10-CM | POA: Diagnosis not present

## 2013-04-11 DIAGNOSIS — Z7901 Long term (current) use of anticoagulants: Secondary | ICD-10-CM | POA: Diagnosis not present

## 2013-04-19 ENCOUNTER — Encounter: Payer: Medicare Other | Attending: Endocrinology | Admitting: Nutrition

## 2013-04-19 ENCOUNTER — Encounter: Payer: Self-pay | Admitting: Nutrition

## 2013-04-19 VITALS — Wt 224.4 lb

## 2013-04-19 DIAGNOSIS — E119 Type 2 diabetes mellitus without complications: Secondary | ICD-10-CM | POA: Insufficient documentation

## 2013-04-19 DIAGNOSIS — E1165 Type 2 diabetes mellitus with hyperglycemia: Secondary | ICD-10-CM

## 2013-04-19 DIAGNOSIS — IMO0001 Reserved for inherently not codable concepts without codable children: Secondary | ICD-10-CM

## 2013-04-19 DIAGNOSIS — Z713 Dietary counseling and surveillance: Secondary | ICD-10-CM | POA: Insufficient documentation

## 2013-04-19 NOTE — Progress Notes (Signed)
Weight stable, and food diary shows that meals are balanced with 45-60 grams of carbs at each meal.   Still eating cereal some days acL,and he was shown that on those days, his blood sugar is 140 points higher acS.  He reported good understanding this and agreed not to eat it any more.   SBGM:  FBSs: 80-142 (one low of 67--which he says is very rare).  ACL: 107-122, acS 91-136 (execpt when eating cereal), HS: 126-158.    Patient is adjusting his Humalog dose by 1-4u when eating more, or if blood sugar is high prior to the meal.  No low blood sugars from taking too much Humalog.  No low blood sugars noted at any time, except for that one fasting.  He treated this with 1/2 cup orange juice.    Plan: no changes needed to insulin or diet, except for stopping the cereal.

## 2013-04-20 DIAGNOSIS — Z940 Kidney transplant status: Secondary | ICD-10-CM | POA: Diagnosis not present

## 2013-04-20 DIAGNOSIS — E1129 Type 2 diabetes mellitus with other diabetic kidney complication: Secondary | ICD-10-CM | POA: Diagnosis not present

## 2013-04-20 DIAGNOSIS — N2581 Secondary hyperparathyroidism of renal origin: Secondary | ICD-10-CM | POA: Diagnosis not present

## 2013-04-28 DIAGNOSIS — I10 Essential (primary) hypertension: Secondary | ICD-10-CM | POA: Diagnosis not present

## 2013-04-28 DIAGNOSIS — N189 Chronic kidney disease, unspecified: Secondary | ICD-10-CM | POA: Diagnosis not present

## 2013-04-28 DIAGNOSIS — E038 Other specified hypothyroidism: Secondary | ICD-10-CM | POA: Diagnosis not present

## 2013-04-28 DIAGNOSIS — E119 Type 2 diabetes mellitus without complications: Secondary | ICD-10-CM | POA: Diagnosis not present

## 2013-04-28 DIAGNOSIS — I4891 Unspecified atrial fibrillation: Secondary | ICD-10-CM | POA: Diagnosis not present

## 2013-04-28 DIAGNOSIS — E7800 Pure hypercholesterolemia, unspecified: Secondary | ICD-10-CM | POA: Diagnosis not present

## 2013-05-03 DIAGNOSIS — Z940 Kidney transplant status: Secondary | ICD-10-CM | POA: Diagnosis not present

## 2013-05-03 DIAGNOSIS — N2581 Secondary hyperparathyroidism of renal origin: Secondary | ICD-10-CM | POA: Diagnosis not present

## 2013-05-03 DIAGNOSIS — E1129 Type 2 diabetes mellitus with other diabetic kidney complication: Secondary | ICD-10-CM | POA: Diagnosis not present

## 2013-05-04 ENCOUNTER — Other Ambulatory Visit (INDEPENDENT_AMBULATORY_CARE_PROVIDER_SITE_OTHER): Payer: Medicare Other

## 2013-05-04 DIAGNOSIS — IMO0001 Reserved for inherently not codable concepts without codable children: Secondary | ICD-10-CM | POA: Diagnosis not present

## 2013-05-04 DIAGNOSIS — E1165 Type 2 diabetes mellitus with hyperglycemia: Secondary | ICD-10-CM

## 2013-05-04 DIAGNOSIS — E039 Hypothyroidism, unspecified: Secondary | ICD-10-CM

## 2013-05-04 LAB — LIPID PANEL
CHOL/HDL RATIO: 7
CHOLESTEROL: 166 mg/dL (ref 0–200)
HDL: 24.9 mg/dL — ABNORMAL LOW (ref >39.00)
TRIGLYCERIDES: 369 mg/dL — AB (ref 0.0–149.0)
VLDL: 73.8 mg/dL — AB (ref 0.0–40.0)

## 2013-05-04 LAB — T4, FREE: Free T4: 1.28 ng/dL (ref 0.60–1.60)

## 2013-05-04 LAB — COMPREHENSIVE METABOLIC PANEL
ALT: 21 U/L (ref 0–53)
AST: 29 U/L (ref 0–37)
Albumin: 3.5 g/dL (ref 3.5–5.2)
Alkaline Phosphatase: 38 U/L — ABNORMAL LOW (ref 39–117)
BILIRUBIN TOTAL: 1.2 mg/dL (ref 0.3–1.2)
BUN: 26 mg/dL — ABNORMAL HIGH (ref 6–23)
CO2: 30 meq/L (ref 19–32)
Calcium: 9.4 mg/dL (ref 8.4–10.5)
Chloride: 102 mEq/L (ref 96–112)
Creatinine, Ser: 2.1 mg/dL — ABNORMAL HIGH (ref 0.4–1.5)
GFR: 33.25 mL/min — AB (ref >60.00)
Glucose, Bld: 182 mg/dL — ABNORMAL HIGH (ref 70–99)
Potassium: 3.7 mEq/L (ref 3.5–5.1)
Sodium: 139 mEq/L (ref 135–145)
TOTAL PROTEIN: 7.3 g/dL (ref 6.0–8.3)

## 2013-05-04 LAB — LDL CHOLESTEROL, DIRECT: Direct LDL: 84.7 mg/dL

## 2013-05-04 LAB — TSH: TSH: 2 u[IU]/mL (ref 0.35–5.50)

## 2013-05-04 LAB — MICROALBUMIN / CREATININE URINE RATIO
Creatinine,U: 50.1 mg/dL
MICROALB/CREAT RATIO: 66.6 mg/g — AB (ref 0.0–30.0)
Microalb, Ur: 33.4 mg/dL — ABNORMAL HIGH (ref 0.0–1.9)

## 2013-05-05 DIAGNOSIS — N185 Chronic kidney disease, stage 5: Secondary | ICD-10-CM | POA: Diagnosis not present

## 2013-05-05 DIAGNOSIS — I120 Hypertensive chronic kidney disease with stage 5 chronic kidney disease or end stage renal disease: Secondary | ICD-10-CM | POA: Diagnosis not present

## 2013-05-05 DIAGNOSIS — Z940 Kidney transplant status: Secondary | ICD-10-CM | POA: Diagnosis not present

## 2013-05-05 LAB — FRUCTOSAMINE: Fructosamine: 283 umol/L (ref <285)

## 2013-05-08 DIAGNOSIS — I4891 Unspecified atrial fibrillation: Secondary | ICD-10-CM | POA: Diagnosis not present

## 2013-05-08 DIAGNOSIS — E119 Type 2 diabetes mellitus without complications: Secondary | ICD-10-CM | POA: Diagnosis not present

## 2013-05-08 DIAGNOSIS — R7989 Other specified abnormal findings of blood chemistry: Secondary | ICD-10-CM | POA: Diagnosis not present

## 2013-05-08 DIAGNOSIS — Z794 Long term (current) use of insulin: Secondary | ICD-10-CM | POA: Diagnosis not present

## 2013-05-08 DIAGNOSIS — Z888 Allergy status to other drugs, medicaments and biological substances status: Secondary | ICD-10-CM | POA: Diagnosis not present

## 2013-05-08 DIAGNOSIS — Z48298 Encounter for aftercare following other organ transplant: Secondary | ICD-10-CM | POA: Diagnosis not present

## 2013-05-08 DIAGNOSIS — E1129 Type 2 diabetes mellitus with other diabetic kidney complication: Secondary | ICD-10-CM | POA: Diagnosis not present

## 2013-05-08 DIAGNOSIS — R609 Edema, unspecified: Secondary | ICD-10-CM | POA: Diagnosis not present

## 2013-05-08 DIAGNOSIS — G609 Hereditary and idiopathic neuropathy, unspecified: Secondary | ICD-10-CM | POA: Diagnosis not present

## 2013-05-08 DIAGNOSIS — Z79899 Other long term (current) drug therapy: Secondary | ICD-10-CM | POA: Diagnosis not present

## 2013-05-08 DIAGNOSIS — Z940 Kidney transplant status: Secondary | ICD-10-CM | POA: Diagnosis not present

## 2013-05-08 DIAGNOSIS — R944 Abnormal results of kidney function studies: Secondary | ICD-10-CM | POA: Diagnosis not present

## 2013-05-08 DIAGNOSIS — Z85820 Personal history of malignant melanoma of skin: Secondary | ICD-10-CM | POA: Diagnosis not present

## 2013-05-08 DIAGNOSIS — R809 Proteinuria, unspecified: Secondary | ICD-10-CM | POA: Diagnosis not present

## 2013-05-08 DIAGNOSIS — E1139 Type 2 diabetes mellitus with other diabetic ophthalmic complication: Secondary | ICD-10-CM | POA: Diagnosis not present

## 2013-05-08 DIAGNOSIS — N049 Nephrotic syndrome with unspecified morphologic changes: Secondary | ICD-10-CM | POA: Diagnosis not present

## 2013-05-08 DIAGNOSIS — E785 Hyperlipidemia, unspecified: Secondary | ICD-10-CM | POA: Diagnosis not present

## 2013-05-08 DIAGNOSIS — E11359 Type 2 diabetes mellitus with proliferative diabetic retinopathy without macular edema: Secondary | ICD-10-CM | POA: Diagnosis not present

## 2013-05-08 DIAGNOSIS — I10 Essential (primary) hypertension: Secondary | ICD-10-CM | POA: Diagnosis not present

## 2013-05-08 DIAGNOSIS — T8610 Unspecified complication of kidney transplant: Secondary | ICD-10-CM | POA: Diagnosis not present

## 2013-05-10 ENCOUNTER — Ambulatory Visit: Payer: Medicare Other | Admitting: Endocrinology

## 2013-05-11 DIAGNOSIS — Z79899 Other long term (current) drug therapy: Secondary | ICD-10-CM | POA: Diagnosis not present

## 2013-05-11 DIAGNOSIS — Z940 Kidney transplant status: Secondary | ICD-10-CM | POA: Diagnosis not present

## 2013-05-11 DIAGNOSIS — I10 Essential (primary) hypertension: Secondary | ICD-10-CM | POA: Diagnosis not present

## 2013-05-11 DIAGNOSIS — IMO0001 Reserved for inherently not codable concepts without codable children: Secondary | ICD-10-CM | POA: Diagnosis not present

## 2013-05-11 DIAGNOSIS — R609 Edema, unspecified: Secondary | ICD-10-CM | POA: Diagnosis not present

## 2013-05-11 DIAGNOSIS — Z48298 Encounter for aftercare following other organ transplant: Secondary | ICD-10-CM | POA: Diagnosis not present

## 2013-05-22 ENCOUNTER — Other Ambulatory Visit: Payer: Self-pay | Admitting: Endocrinology

## 2013-05-31 DIAGNOSIS — Z940 Kidney transplant status: Secondary | ICD-10-CM | POA: Diagnosis not present

## 2013-05-31 DIAGNOSIS — N2581 Secondary hyperparathyroidism of renal origin: Secondary | ICD-10-CM | POA: Diagnosis not present

## 2013-06-01 ENCOUNTER — Ambulatory Visit: Payer: Medicare Other | Admitting: Endocrinology

## 2013-06-08 DIAGNOSIS — I4891 Unspecified atrial fibrillation: Secondary | ICD-10-CM | POA: Diagnosis not present

## 2013-06-08 DIAGNOSIS — Z7901 Long term (current) use of anticoagulants: Secondary | ICD-10-CM | POA: Diagnosis not present

## 2013-06-10 DIAGNOSIS — I10 Essential (primary) hypertension: Secondary | ICD-10-CM | POA: Diagnosis not present

## 2013-06-10 DIAGNOSIS — K219 Gastro-esophageal reflux disease without esophagitis: Secondary | ICD-10-CM | POA: Diagnosis not present

## 2013-06-10 DIAGNOSIS — E7800 Pure hypercholesterolemia, unspecified: Secondary | ICD-10-CM | POA: Diagnosis not present

## 2013-06-10 DIAGNOSIS — W1809XA Striking against other object with subsequent fall, initial encounter: Secondary | ICD-10-CM | POA: Diagnosis not present

## 2013-06-10 DIAGNOSIS — Z79899 Other long term (current) drug therapy: Secondary | ICD-10-CM | POA: Diagnosis not present

## 2013-06-10 DIAGNOSIS — Z23 Encounter for immunization: Secondary | ICD-10-CM | POA: Diagnosis not present

## 2013-06-10 DIAGNOSIS — I4891 Unspecified atrial fibrillation: Secondary | ICD-10-CM | POA: Diagnosis not present

## 2013-06-10 DIAGNOSIS — S0180XA Unspecified open wound of other part of head, initial encounter: Secondary | ICD-10-CM | POA: Diagnosis not present

## 2013-06-10 DIAGNOSIS — E119 Type 2 diabetes mellitus without complications: Secondary | ICD-10-CM | POA: Diagnosis not present

## 2013-06-10 DIAGNOSIS — Z7901 Long term (current) use of anticoagulants: Secondary | ICD-10-CM | POA: Diagnosis not present

## 2013-06-26 ENCOUNTER — Encounter: Payer: Self-pay | Admitting: Endocrinology

## 2013-06-26 ENCOUNTER — Ambulatory Visit (INDEPENDENT_AMBULATORY_CARE_PROVIDER_SITE_OTHER): Payer: Medicare Other | Admitting: Endocrinology

## 2013-06-26 ENCOUNTER — Other Ambulatory Visit: Payer: Self-pay | Admitting: *Deleted

## 2013-06-26 VITALS — BP 126/78 | HR 67 | Temp 98.4°F | Resp 16 | Ht 69.0 in | Wt 220.8 lb

## 2013-06-26 DIAGNOSIS — IMO0001 Reserved for inherently not codable concepts without codable children: Secondary | ICD-10-CM

## 2013-06-26 DIAGNOSIS — E785 Hyperlipidemia, unspecified: Secondary | ICD-10-CM | POA: Diagnosis not present

## 2013-06-26 DIAGNOSIS — E039 Hypothyroidism, unspecified: Secondary | ICD-10-CM | POA: Diagnosis not present

## 2013-06-26 DIAGNOSIS — N183 Chronic kidney disease, stage 3 unspecified: Secondary | ICD-10-CM | POA: Diagnosis not present

## 2013-06-26 DIAGNOSIS — E1165 Type 2 diabetes mellitus with hyperglycemia: Principal | ICD-10-CM

## 2013-06-26 MED ORDER — "INSULIN SYRINGE 31G X 5/16"" 0.3 ML MISC": Status: DC

## 2013-06-26 MED ORDER — GLUCOSE BLOOD VI STRP: ORAL_STRIP | Status: DC

## 2013-06-26 MED ORDER — ONETOUCH DELICA LANCETS FINE MISC: Status: DC

## 2013-06-26 NOTE — Progress Notes (Signed)
Patient ID: Mark [name], male   DOB: 1940/11/05, 73 y.o.   MRN: [id number]   Reason for Appointment: Diabetes follow-up   History of Present Illness   Diagnosis: Type 2 DIABETES MELITUS, long-standing and insulin-dependent       He has been on basal bolus insulin regimen for several years. Previously had been taking much larger doses of Lantus but is now requiring less since he had lost weight and had intercurrent illnesses He had done very well with Victoza in the past which had helped weight control and glucose but he had some nausea and did not want to pay the cost   Insulin regimen: Humalog 28-30 tid before meals  Lantus 20 a.m.-20 p.m.  Recent history: Overall blood sugars are relatively high as judged by his A1c of 8.6% this month Although his home average blood sugar is lower at 148 compared to 181 he is checking his blood sugars mostly in the mornings and only sporadically before other meals; no post prandial monitoring His A1c has been consistently high since 7/14 He takes relatively larger amounts of mealtime coverage compared to Lantus Blood sugars are fluctuating and no consistent trend of high readings at any given time He was advised to start the toes on his last visit but did not do so because of cost  Current problems: Most likely his blood sugar variability is related to inconsistent diet He thinks he will tend to get hypoglycemia before lunch sometimes and on those days will take less Lantus in the evening which will cause higher fasting readings the next day Not adjusting mealtime dose of Humalog based on meal size He tends to have relatively high reading with eating cereal in the morning  Oral hypoglycemic drugs: none     Proper timing of medications in relation to meals: Yes, he does inject Humalog right before eating when eating out.          Monitors blood glucose: 2 times a day.    Glucometer: FreeStyle        Blood Glucose readings: Glucometer download  reviewed:   PREMEAL Breakfast Lunch Dinner Bedtime Overall  Glucose range:  77-295   69-252   76-237   67, 71    Mean/median:  140      148    Hypoglycemia frequency:  infrequent recently, has 2 low normal readings at bedtime and once before lunch          Meals: 3 meals per day. for breakfast he will have cereal but sometimes may be high fat; eating out once a day, no snacks; dinner 8 pm    Physical activity: exercise: some walking           Wt Readings from Last 3 Encounters:  06/26/13 220 lb 12.8 oz (100.154 kg)  04/19/13 224 lb 6.4 oz (101.787 kg)  03/23/13 226 lb 1.6 oz (102.558 kg)   8.6 in 3/15 from nephrologist  Lab Results  Component Value Date   HGBA1C 8.9* 03/16/2013   HGBA1C 7.9* 10/18/2012   Lab Results  Component Value Date   MICROALBUR 33.4* 05/04/2013   CREATININE 2.1* 05/04/2013        Medication List       This list is accurate as of: 06/26/13  2:00 PM.  Always use your most recent med list.               allopurinol 300 MG tablet  Commonly known as:  ZYLOPRIM  TAKE 1/2 TABLET BY  MOUTH ONCE DAILY.     BD PEN NEEDLE NANO U/F 32G X 4 MM Misc  Generic drug:  Insulin Pen Needle  USE AS DIRECTED 5 TIMES DAILY SUBQUE.     calcitRIOL 0.5 MCG capsule  Commonly known as:  ROCALTROL  Take one by mouth daily     DIGOX 0.125 MG tablet  Generic drug:  digoxin  Take one by mouth daily     fenofibrate micronized 134 MG capsule  Commonly known as:  LOFIBRA  Take one by mouth daily     furosemide 80 MG tablet  Commonly known as:  LASIX  Take 2 tabs by mouth every am and 1 every pm     glucose blood test strip  Commonly known as:  FREESTYLE LITE  Use as instructed to check blood sugars 4 times per day dx code 250.02     HYDROcodone-acetaminophen 5-325 MG per tablet  Commonly known as:  NORCO/VICODIN  Take 1 by mouth every 4-6 hours as needed     Insulin Glargine 100 UNIT/ML Solostar Pen  Commonly known as:  LANTUS  Inject 20 Units into the skin 2  (two) times daily.     HUMALOG KWIKPEN 100 UNIT/ML KiwkPen  Generic drug:  insulin lispro  INJECT 28-30 UNITS INTO THE SKIN THREE TIMES DAILY WITH MEALS     insulin lispro 100 unit/ml Soln  Commonly known as:  HUMALOG  Inject into the skin. Use 26 units with meals subq     levothyroxine 88 MCG tablet  Commonly known as:  SYNTHROID, LEVOTHROID  TAKE ONE TABLET BY MOUTH IN THE MORNING ON AN EMPTY STOMACH     metoprolol succinate 50 MG 24 hr tablet  Commonly known as:  TOPROL-XL  Take one by mouth daily     nystatin 100000 UNIT/GM Powd     omeprazole 20 MG capsule  Commonly known as:  PRILOSEC  Take 20 mg by mouth daily.     oxyCODONE-acetaminophen 7.5-325 MG per tablet  Commonly known as:  PERCOCET  Take 1 tablet by mouth.     potassium chloride SA 20 MEQ tablet  Commonly known as:  K-DUR,KLOR-CON  Take 2 by mouth two times a day     PROGRAF 0.5 MG capsule  Generic drug:  tacrolimus  Take 0.5 mg by mouth 2 (two) times daily.     RAPAMUNE 0.5 MG Tabs  Generic drug:  Sirolimus  Take by mouth daily.     rosuvastatin 20 MG tablet  Commonly known as:  CRESTOR  Take 1 tablet daily     SSD 1 % cream  Generic drug:  silver sulfADIAZINE  Apply to affected area twice a day     warfarin 5 MG tablet  Commonly known as:  COUMADIN  Take one by mouth daily        Allergies: No Known Allergies  Past Medical History  Diagnosis Date  . Diabetes   . Hypertension   . Gout   . Hypothyroidism     Past Surgical History  Procedure Laterality Date  . Melanoma excision    . Ankle fracture surgery    . Cataract extraction      No family history on file.  Social History:  reports that he has never smoked. He has never used smokeless tobacco. He reports that he does not drink alcohol or use illicit drugs.  Review of Systems  Cardiovascular ROS: positive for history of chronic  Atrial fibrillation  Edema is well-controlled  with large doses of Lasix Mild hypothyroidism,   stable on 88 mcg levothyroxine; compliant with supplement; no recent fatigue  Lab Results  Component Value Date   TSH 2.00 05/04/2013   HYPERLIPIDEMIA: Even though he is taking full doses of fenofibrate along with Crestor his triglycerides are still high. Previously had intolerance to niacin and did not want to resume this  Lab Results  Component Value Date   CHOL 166 05/04/2013   HDL 24.90* 05/04/2013   LDLDIRECT 84.7 05/04/2013   TRIG 369.0* 05/04/2013   CHOLHDL 7 05/04/2013      Examination:   BP 126/78  Pulse 67  Temp(Src) 98.4 F (36.9 C)  Resp 16  Ht 5\' 9"  (1.753 m)  Wt 220 lb 12.8 oz (100.154 kg)  BMI 32.59 kg/m2  SpO2 97%  Body mass index is 32.59 kg/(m^2).   Assesment/ PLAN:  DIABETES: His blood sugars are overall not well controlled with consistently high A1c over 8% He has variability in his blood sugars including fasting based on diet and possibly inappropriate adjustment of his insulin doses as discussed in history of present illness in detail Again he is a good candidate for a GLP-1 drug but he does not want to incur the cost for this Recommendations today: Discussed adjustment of evening Lantus based on fasting blood sugar trend over 3 days as well as increasing her reducing mealtime dose up or down 2-4 years based on the carbohydrate intake Also encouraged him to be walking more consistently to help with weight loss and insulin sensitivity Does need to have more readings after dinner  HYPERLIPIDEMIA: Triglycerides are over 300 in January. Already on maximal pharmacologic treatment and needs better glucose control, diet and weight loss; discussed  HYPERTENSION: Mild and well controlled now with metoprolol  Hypothyroidism: Will continue same doses as TSH is normal  Counseling time over 50% of today's 25 minute visit  Wilson Sample 06/26/2013, 2:00 PM

## 2013-06-26 NOTE — Patient Instructions (Addendum)
Lantus 22 in pm and keep am sugar in the 120-140 range  4 units Less Humalog at lunch if eating less Carbs  Please check blood sugars at least half the time about 2 hours after any meal and as directed on waking up. Please bring blood sugar monitor to each visit

## 2013-07-04 DIAGNOSIS — Z940 Kidney transplant status: Secondary | ICD-10-CM | POA: Diagnosis not present

## 2013-07-06 DIAGNOSIS — I4891 Unspecified atrial fibrillation: Secondary | ICD-10-CM | POA: Diagnosis not present

## 2013-07-06 DIAGNOSIS — Z7901 Long term (current) use of anticoagulants: Secondary | ICD-10-CM | POA: Diagnosis not present

## 2013-07-10 ENCOUNTER — Telehealth: Payer: Self-pay | Admitting: *Deleted

## 2013-07-10 NOTE — Telephone Encounter (Signed)
Humulin NPH instead of Lantus: Start with 22 units in the morning and 18 at bedtime Increase the evening dose every 3 days by 2 units if morning sugar stays over 140 May need to reduce the morning NovoLog insulin if blood sugars are lower at lunchtime

## 2013-07-10 NOTE — Telephone Encounter (Signed)
Patient called, he is in the donut hole and unable to afford his lantus, is there a cheaper alternative?

## 2013-07-11 ENCOUNTER — Other Ambulatory Visit: Payer: Self-pay | Admitting: *Deleted

## 2013-07-11 MED ORDER — INSULIN ISOPHANE HUMAN 100 UNIT/ML KWIKPEN
PEN_INJECTOR | SUBCUTANEOUS | Status: DC
Start: 2013-07-11 — End: 2013-08-21

## 2013-07-19 ENCOUNTER — Encounter: Payer: Self-pay | Admitting: Endocrinology

## 2013-07-31 ENCOUNTER — Other Ambulatory Visit: Payer: Self-pay | Admitting: *Deleted

## 2013-07-31 MED ORDER — ALLOPURINOL 300 MG PO TABS: ORAL_TABLET | ORAL | Status: DC

## 2013-07-31 MED ORDER — ROSUVASTATIN CALCIUM 20 MG PO TABS: ORAL_TABLET | ORAL | Status: DC

## 2013-08-01 DIAGNOSIS — Z940 Kidney transplant status: Secondary | ICD-10-CM | POA: Diagnosis not present

## 2013-08-03 DIAGNOSIS — Z7901 Long term (current) use of anticoagulants: Secondary | ICD-10-CM | POA: Diagnosis not present

## 2013-08-03 DIAGNOSIS — I4891 Unspecified atrial fibrillation: Secondary | ICD-10-CM | POA: Diagnosis not present

## 2013-08-04 DIAGNOSIS — I4891 Unspecified atrial fibrillation: Secondary | ICD-10-CM | POA: Diagnosis not present

## 2013-08-04 DIAGNOSIS — E119 Type 2 diabetes mellitus without complications: Secondary | ICD-10-CM | POA: Diagnosis not present

## 2013-08-04 DIAGNOSIS — I10 Essential (primary) hypertension: Secondary | ICD-10-CM | POA: Diagnosis not present

## 2013-08-04 DIAGNOSIS — N189 Chronic kidney disease, unspecified: Secondary | ICD-10-CM | POA: Diagnosis not present

## 2013-08-04 DIAGNOSIS — E7800 Pure hypercholesterolemia, unspecified: Secondary | ICD-10-CM | POA: Diagnosis not present

## 2013-08-05 ENCOUNTER — Other Ambulatory Visit: Payer: Self-pay | Admitting: Endocrinology

## 2013-08-18 DIAGNOSIS — H35379 Puckering of macula, unspecified eye: Secondary | ICD-10-CM | POA: Diagnosis not present

## 2013-08-18 DIAGNOSIS — E119 Type 2 diabetes mellitus without complications: Secondary | ICD-10-CM | POA: Diagnosis not present

## 2013-08-18 DIAGNOSIS — Z79899 Other long term (current) drug therapy: Secondary | ICD-10-CM | POA: Diagnosis not present

## 2013-08-18 DIAGNOSIS — Z940 Kidney transplant status: Secondary | ICD-10-CM | POA: Diagnosis not present

## 2013-08-21 ENCOUNTER — Other Ambulatory Visit: Payer: Self-pay | Admitting: *Deleted

## 2013-08-21 MED ORDER — INSULIN ISOPHANE HUMAN 100 UNIT/ML KWIKPEN: PEN_INJECTOR | SUBCUTANEOUS | Status: DC

## 2013-08-29 DIAGNOSIS — D485 Neoplasm of uncertain behavior of skin: Secondary | ICD-10-CM | POA: Diagnosis not present

## 2013-08-29 DIAGNOSIS — L570 Actinic keratosis: Secondary | ICD-10-CM | POA: Diagnosis not present

## 2013-08-29 DIAGNOSIS — Z85820 Personal history of malignant melanoma of skin: Secondary | ICD-10-CM | POA: Diagnosis not present

## 2013-08-30 DIAGNOSIS — N2581 Secondary hyperparathyroidism of renal origin: Secondary | ICD-10-CM | POA: Diagnosis not present

## 2013-08-30 DIAGNOSIS — I120 Hypertensive chronic kidney disease with stage 5 chronic kidney disease or end stage renal disease: Secondary | ICD-10-CM | POA: Diagnosis not present

## 2013-08-30 DIAGNOSIS — Z940 Kidney transplant status: Secondary | ICD-10-CM | POA: Diagnosis not present

## 2013-08-30 DIAGNOSIS — Z7901 Long term (current) use of anticoagulants: Secondary | ICD-10-CM | POA: Diagnosis not present

## 2013-08-30 DIAGNOSIS — I4891 Unspecified atrial fibrillation: Secondary | ICD-10-CM | POA: Diagnosis not present

## 2013-09-04 ENCOUNTER — Telehealth: Payer: Self-pay | Admitting: Endocrinology

## 2013-09-04 ENCOUNTER — Other Ambulatory Visit: Payer: Self-pay | Admitting: *Deleted

## 2013-09-04 MED ORDER — GLUCOSE BLOOD VI STRP
ORAL_STRIP | Status: DC
Start: 2013-09-04 — End: 2013-09-08

## 2013-09-04 NOTE — Telephone Encounter (Signed)
Pt needs one touch test strips 100-150 quantity. Pt needs assistance humalog and humalin.

## 2013-09-08 ENCOUNTER — Other Ambulatory Visit: Payer: Self-pay | Admitting: *Deleted

## 2013-09-08 MED ORDER — GLUCOSE BLOOD VI STRP: ORAL_STRIP | Status: DC

## 2013-09-11 ENCOUNTER — Other Ambulatory Visit: Payer: Self-pay | Admitting: *Deleted

## 2013-09-11 ENCOUNTER — Telehealth: Payer: Self-pay | Admitting: Endocrinology

## 2013-09-11 NOTE — Telephone Encounter (Signed)
[facility]

## 2013-09-11 NOTE — Telephone Encounter (Signed)
Patient needs refill on test strips [facility]   [facility]   Thank you :)

## 2013-09-21 DIAGNOSIS — Z940 Kidney transplant status: Secondary | ICD-10-CM | POA: Diagnosis not present

## 2013-09-23 ENCOUNTER — Other Ambulatory Visit: Payer: Self-pay | Admitting: Endocrinology

## 2013-09-27 ENCOUNTER — Ambulatory Visit (INDEPENDENT_AMBULATORY_CARE_PROVIDER_SITE_OTHER): Payer: Medicare Other | Admitting: Endocrinology

## 2013-09-27 ENCOUNTER — Encounter: Payer: Self-pay | Admitting: Endocrinology

## 2013-09-27 VITALS — BP 122/74 | HR 77 | Temp 98.2°F | Resp 16 | Ht 69.0 in | Wt 227.8 lb

## 2013-09-27 DIAGNOSIS — IMO0001 Reserved for inherently not codable concepts without codable children: Secondary | ICD-10-CM

## 2013-09-27 DIAGNOSIS — E039 Hypothyroidism, unspecified: Secondary | ICD-10-CM | POA: Diagnosis not present

## 2013-09-27 DIAGNOSIS — E785 Hyperlipidemia, unspecified: Secondary | ICD-10-CM

## 2013-09-27 DIAGNOSIS — E1165 Type 2 diabetes mellitus with hyperglycemia: Principal | ICD-10-CM

## 2013-09-27 LAB — MICROALBUMIN / CREATININE URINE RATIO
Creatinine,U: 65 mg/dL
MICROALB UR: 69.4 mg/dL — AB (ref 0.0–1.9)
Microalb Creat Ratio: 106.7 mg/g — ABNORMAL HIGH (ref 0.0–30.0)

## 2013-09-27 LAB — TSH: TSH: 1.58 u[IU]/mL (ref 0.35–4.50)

## 2013-09-27 LAB — LIPID PANEL
CHOL/HDL RATIO: 6
Cholesterol: 172 mg/dL (ref 0–200)
HDL: 26.7 mg/dL — AB (ref >39.00)
LDL CALC: 88 mg/dL (ref 0–99)
NONHDL: 145.3
Triglycerides: 285 mg/dL — ABNORMAL HIGH (ref 0.0–149.0)
VLDL: 57 mg/dL — AB (ref 0.0–40.0)

## 2013-09-27 LAB — GLUCOSE, RANDOM: Glucose, Bld: 131 mg/dL — ABNORMAL HIGH (ref 70–99)

## 2013-09-27 LAB — HEMOGLOBIN A1C: Hgb A1c MFr Bld: 8 % — ABNORMAL HIGH (ref 4.6–6.5)

## 2013-09-27 NOTE — Progress Notes (Signed)
Patient ID: Mark [name], male   DOB: 1940/09/29, 73 y.o.   MRN: [id number]   Reason for Appointment: Diabetes follow-up   History of Present Illness   Diagnosis: Type 2 DIABETES MELITUS, long-standing and insulin-dependent       He has been on basal bolus insulin regimen for several years. Previously had been taking much larger doses of Lantus but is now requiring less since he had lost weight and had intercurrent illnesses He had done very well with Victoza in the past which had helped weight control and glucose but he had nausea and did not want to pay the high cost   Insulin regimen: Humalog 28-30 tid before meals. Humulin 25 a.m. -21 units at 9 PM Recent history:  Because of his difficulty affording Lantus and Humalog insulin he was switched to NPH in 5/15 Although he has not checked his blood sugars very often he appears to have fairly good blood sugars overall Previously was having periodic readings over 200 in the morning and during the day He has had less fluctuation of his morning blood sugars and these are averaging slightly better with NPH He has increased his dose slightly of the NPH and blood sugars are improving in the morning He does have some postprandial readings over 200 especially with eating cereal in the morning and occasionally at night also A1c not available recently His A1c has been consistently high since 7/14 He is not adjusting his mealtime dose much based on his meal size and usually taking the same amount He thinks he may occasionally have hypoglycemia after lunch but did not document the low readings He is also tending to gain weight; sas been reluctant to retry Victoza because of cost  Oral hypoglycemic drugs: none     Proper timing of medications in relation to meals: Yes, he does inject Humalog right before eating when eating out.          Monitors blood glucose: 2 times a day.    Glucometer: Verio       Blood Glucose readings: Glucometer download  reviewed:   PREMEAL Breakfast Lunch Dinner  PCS  Overall  Glucose range:  86-186   96-236   115-169   107-254    Median:  131      135    Hypoglycemia: at 4 pm occasionally, about 2 hours after lunch      Meals: 3 meals per day. for breakfast he will have cereal but sometimes outside meals may be high fat; eating out once a day, no snacks; having dinner 8 pm    Physical activity: exercise: some walking           Wt Readings from Last 3 Encounters:  09/27/13 227 lb 12.8 oz (103.329 kg)  06/26/13 220 lb 12.8 oz (100.154 kg)  04/19/13 224 lb 6.4 oz (101.787 kg)     Lab Results  Component Value Date   HGBA1C 8.9* 03/16/2013   HGBA1C 7.9* 10/18/2012   Lab Results  Component Value Date   MICROALBUR 33.4* 05/04/2013   CREATININE 2.1* 05/04/2013        Medication List       This list is accurate as of: 09/27/13  8:42 AM.  Always use your most recent med list.               allopurinol 300 MG tablet  Commonly known as:  ZYLOPRIM  TAKE 1/2 TABLET BY MOUTH ONCE DAILY.     BD PEN NEEDLE  NANO U/F 32G X 4 MM Misc  Generic drug:  Insulin Pen Needle  USE AS DIRECTED 5 TIMES DAILY SUBQUE.     calcitRIOL 0.5 MCG capsule  Commonly known as:  ROCALTROL  Take one by mouth daily     DIGOX 0.125 MG tablet  Generic drug:  digoxin  Take one by mouth daily     fenofibrate micronized 134 MG capsule  Commonly known as:  LOFIBRA  TAKE ONE CAPSULE EVERY DAY WITH A MEAL     furosemide 80 MG tablet  Commonly known as:  LASIX  Take 2 tabs by mouth every am and 1 every pm     glucose blood test strip  Commonly known as:  ONETOUCH VERIO  Use as instructed to check blood sugar 4 times per day dx code 250.02     HYDROcodone-acetaminophen 5-325 MG per tablet  Commonly known as:  NORCO/VICODIN  Take 1 by mouth every 4-6 hours as needed     Insulin Glargine 100 UNIT/ML Solostar Pen  Commonly known as:  LANTUS  Inject 20 Units into the skin 2 (two) times daily.     HUMALOG KWIKPEN 100  UNIT/ML KiwkPen  Generic drug:  insulin lispro  INJECT 28-30 UNITS INTO THE SKIN THREE TIMES DAILY WITH MEALS     insulin lispro 100 unit/ml Soln  Commonly known as:  HUMALOG  Inject into the skin. Use 26 units with meals subq     Insulin NPH (Human) (Isophane) 100 UNIT/ML Kiwkpen  Commonly known as:  HUMULIN N  Start with 25 units in the morning and 21 at bedtime Increase the evening dose every 3 days by 2 units if morning sugar stays over 140     INSULIN SYRINGE .3CC/31GX5/16" 31G X 5/16" 0.3 ML Misc  Use as directed to inject insulin     levothyroxine 88 MCG tablet  Commonly known as:  SYNTHROID, LEVOTHROID  TAKE ONE TABLET BY MOUTH IN THE MORNING ON AN EMPTY STOMACH     metoprolol succinate 50 MG 24 hr tablet  Commonly known as:  TOPROL-XL  Take one by mouth daily     nystatin 100000 UNIT/GM Powd     omeprazole 20 MG capsule  Commonly known as:  PRILOSEC  Take 20 mg by mouth daily.     ONETOUCH DELICA LANCETS FINE Misc  Use to check blood sugar 4 times per day dx code 250.02     oxyCODONE-acetaminophen 7.5-325 MG per tablet  Commonly known as:  PERCOCET  Take 1 tablet by mouth.     potassium chloride SA 20 MEQ tablet  Commonly known as:  K-DUR,KLOR-CON  Take 2 by mouth two times a day     PROGRAF 0.5 MG capsule  Generic drug:  tacrolimus  Take 0.5 mg by mouth 2 (two) times daily.     RAPAMUNE 0.5 MG Tabs  Generic drug:  Sirolimus  Take by mouth daily.     rosuvastatin 20 MG tablet  Commonly known as:  CRESTOR  Take 1 tablet daily     SSD 1 % cream  Generic drug:  silver sulfADIAZINE  Apply to affected area twice a day     warfarin 5 MG tablet  Commonly known as:  COUMADIN  Take one by mouth daily        Allergies: No Known Allergies  Past Medical History  Diagnosis Date  . Diabetes   . Hypertension   . Gout   . Hypothyroidism  Past Surgical History  Procedure Laterality Date  . Melanoma excision    . Ankle fracture surgery    .  Cataract extraction      No family history on file.  Social History:  reports that he has never smoked. He has never used smokeless tobacco. He reports that he does not drink alcohol or use illicit drugs.  Review of Systems  Cardiovascular ROS: positive for history of chronic  Atrial fibrillation  Edema is well-controlled with large doses of Lasix Mild hypothyroidism,  stable on 88 mcg levothyroxine; compliant with supplement; no recent fatigue  Lab Results  Component Value Date   TSH 2.00 05/04/2013   HYPERLIPIDEMIA: Even though he is taking full doses of fenofibrate along with Crestor his triglycerides are still high. Previously had intolerance to niacin and did not want to resume this  Lab Results  Component Value Date   CHOL 166 05/04/2013   HDL 24.90* 05/04/2013   LDLDIRECT 84.7 05/04/2013   TRIG 369.0* 05/04/2013   CHOLHDL 7 05/04/2013      Examination:   BP 122/74  Pulse 77  Temp(Src) 98.2 F (36.8 C)  Resp 16  Ht 5\' 9"  (1.753 m)  Wt 227 lb 12.8 oz (103.329 kg)  BMI 33.62 kg/m2  SpO2 97%  Body mass index is 33.62 kg/(m^2).   Assesment/ PLAN:  DIABETES: His blood sugars are overall slightly better controlled with switching to NPH insulin He is taking about 6 units more per day compared to his Lantus insulin He is however getting less fluctuation in the morning and blood sugars are not as high He has variability in his blood sugars during the day but it not checking enough readings Most of his high readings are related to unbalanced meals or higher carbohydrate or fat intake Also he has gained weight since starting NPH A1c to be checked today  HYPERLIPIDEMIA: Triglycerides need followup  HYPERTENSION: Mild and well controlled now with metoprolol  KUMAR,AJAY 09/27/2013, 8:42 AM

## 2013-09-27 NOTE — Patient Instructions (Addendum)
[employer] brand of NPH insulin at [facility]  Please check blood sugars at least half the time about 2 hours after any meal and times per week on waking up. Please bring blood sugar monitor to each visit

## 2013-09-28 DIAGNOSIS — Z7901 Long term (current) use of anticoagulants: Secondary | ICD-10-CM | POA: Diagnosis not present

## 2013-09-28 DIAGNOSIS — I4891 Unspecified atrial fibrillation: Secondary | ICD-10-CM | POA: Diagnosis not present

## 2013-10-11 ENCOUNTER — Telehealth (HOSPITAL_COMMUNITY): Payer: Self-pay | Admitting: Endocrinology

## 2013-10-11 NOTE — Telephone Encounter (Signed)
Need to talk to [facility] about Humalog, Humulin, see if he can get somes pins vials, either one, need both. Thank you   [phone] Home [phone] cell

## 2013-10-23 ENCOUNTER — Telehealth: Payer: Self-pay | Admitting: Endocrinology

## 2013-10-23 NOTE — Telephone Encounter (Signed)
Patient would like to speak with Rhonda  ° °Thank you  °

## 2013-10-26 DIAGNOSIS — I4891 Unspecified atrial fibrillation: Secondary | ICD-10-CM | POA: Diagnosis not present

## 2013-10-26 DIAGNOSIS — Z7901 Long term (current) use of anticoagulants: Secondary | ICD-10-CM | POA: Diagnosis not present

## 2013-11-03 ENCOUNTER — Other Ambulatory Visit: Payer: Self-pay | Admitting: Endocrinology

## 2013-11-07 DIAGNOSIS — Z940 Kidney transplant status: Secondary | ICD-10-CM | POA: Diagnosis not present

## 2013-11-14 ENCOUNTER — Telehealth: Payer: Self-pay | Admitting: Endocrinology

## 2013-11-14 NOTE — Telephone Encounter (Signed)
Pt returning your call cell is [phone]

## 2013-11-15 DIAGNOSIS — Z5189 Encounter for other specified aftercare: Secondary | ICD-10-CM | POA: Diagnosis not present

## 2013-11-15 DIAGNOSIS — H35379 Puckering of macula, unspecified eye: Secondary | ICD-10-CM | POA: Diagnosis not present

## 2013-11-15 DIAGNOSIS — E11359 Type 2 diabetes mellitus with proliferative diabetic retinopathy without macular edema: Secondary | ICD-10-CM | POA: Diagnosis not present

## 2013-11-15 DIAGNOSIS — H02839 Dermatochalasis of unspecified eye, unspecified eyelid: Secondary | ICD-10-CM | POA: Diagnosis not present

## 2013-11-20 DIAGNOSIS — Z23 Encounter for immunization: Secondary | ICD-10-CM | POA: Diagnosis not present

## 2013-11-20 DIAGNOSIS — M109 Gout, unspecified: Secondary | ICD-10-CM | POA: Diagnosis not present

## 2013-11-20 DIAGNOSIS — E119 Type 2 diabetes mellitus without complications: Secondary | ICD-10-CM | POA: Diagnosis not present

## 2013-11-20 DIAGNOSIS — I4891 Unspecified atrial fibrillation: Secondary | ICD-10-CM | POA: Diagnosis not present

## 2013-11-20 DIAGNOSIS — E7800 Pure hypercholesterolemia, unspecified: Secondary | ICD-10-CM | POA: Diagnosis not present

## 2013-11-20 DIAGNOSIS — N189 Chronic kidney disease, unspecified: Secondary | ICD-10-CM | POA: Diagnosis not present

## 2013-11-20 DIAGNOSIS — I10 Essential (primary) hypertension: Secondary | ICD-10-CM | POA: Diagnosis not present

## 2013-11-20 DIAGNOSIS — E1142 Type 2 diabetes mellitus with diabetic polyneuropathy: Secondary | ICD-10-CM | POA: Diagnosis not present

## 2013-11-29 DIAGNOSIS — Z7901 Long term (current) use of anticoagulants: Secondary | ICD-10-CM | POA: Diagnosis not present

## 2013-11-29 DIAGNOSIS — I4891 Unspecified atrial fibrillation: Secondary | ICD-10-CM | POA: Diagnosis not present

## 2013-11-30 ENCOUNTER — Other Ambulatory Visit: Payer: Self-pay | Admitting: Endocrinology

## 2013-11-30 DIAGNOSIS — Z940 Kidney transplant status: Secondary | ICD-10-CM | POA: Diagnosis not present

## 2013-12-14 DIAGNOSIS — D235 Other benign neoplasm of skin of trunk: Secondary | ICD-10-CM | POA: Diagnosis not present

## 2013-12-14 DIAGNOSIS — Z85828 Personal history of other malignant neoplasm of skin: Secondary | ICD-10-CM | POA: Diagnosis not present

## 2013-12-14 DIAGNOSIS — L570 Actinic keratosis: Secondary | ICD-10-CM | POA: Diagnosis not present

## 2013-12-15 ENCOUNTER — Ambulatory Visit (INDEPENDENT_AMBULATORY_CARE_PROVIDER_SITE_OTHER): Payer: Medicare Other | Admitting: Podiatry

## 2013-12-15 ENCOUNTER — Encounter: Payer: Self-pay | Admitting: Podiatry

## 2013-12-15 VITALS — BP 178/84 | HR 61

## 2013-12-15 DIAGNOSIS — L97509 Non-pressure chronic ulcer of other part of unspecified foot with unspecified severity: Secondary | ICD-10-CM

## 2013-12-15 DIAGNOSIS — R600 Localized edema: Secondary | ICD-10-CM

## 2013-12-15 DIAGNOSIS — R609 Edema, unspecified: Secondary | ICD-10-CM | POA: Diagnosis not present

## 2013-12-15 HISTORY — DX: Non-pressure chronic ulcer of other part of unspecified foot with unspecified severity: L97.509

## 2013-12-15 NOTE — Progress Notes (Signed)
Subjective: 72 year old male presents with sore toe 5th digit right. He has been to wound care center and had nails done while he was visiting. Right leg wound has healed off. Stated that he is not able to keep compression socks himself.   Objective: Severe pitting lower limb edema bilateral. Ulcerated digit distal plantar 5th right with 0.5cm shallow opening, limited to dermal layer.  Pedal pulses are not palpable. Severe contracted and under ridding 5th digit right.  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 PVD. Severe Lower limb edema.  Plan: Debrided necrotic tissue. Aperture pad placed. Home care instruction given. Return in one week.

## 2013-12-15 NOTE — Patient Instructions (Signed)
Seen for right 5th toe lesion. Debrided and padded. Return in one week.

## 2013-12-18 ENCOUNTER — Other Ambulatory Visit (INDEPENDENT_AMBULATORY_CARE_PROVIDER_SITE_OTHER): Payer: Medicare Other

## 2013-12-18 DIAGNOSIS — IMO0001 Reserved for inherently not codable concepts without codable children: Secondary | ICD-10-CM | POA: Diagnosis not present

## 2013-12-18 DIAGNOSIS — E1165 Type 2 diabetes mellitus with hyperglycemia: Principal | ICD-10-CM

## 2013-12-18 LAB — BASIC METABOLIC PANEL
BUN: 33 mg/dL — ABNORMAL HIGH (ref 6–23)
CALCIUM: 8.9 mg/dL (ref 8.4–10.5)
CO2: 30 mEq/L (ref 19–32)
Chloride: 102 mEq/L (ref 96–112)
Creatinine, Ser: 2.2 mg/dL — ABNORMAL HIGH (ref 0.4–1.5)
GFR: 31.28 mL/min — ABNORMAL LOW (ref >60.00)
Glucose, Bld: 105 mg/dL — ABNORMAL HIGH (ref 70–99)
Potassium: 3.6 mEq/L (ref 3.5–5.1)
SODIUM: 141 meq/L (ref 135–145)

## 2013-12-18 LAB — HEMOGLOBIN A1C: Hgb A1c MFr Bld: 7.7 % — ABNORMAL HIGH (ref 4.6–6.5)

## 2013-12-19 ENCOUNTER — Ambulatory Visit (INDEPENDENT_AMBULATORY_CARE_PROVIDER_SITE_OTHER): Payer: Medicare Other | Admitting: Endocrinology

## 2013-12-19 ENCOUNTER — Encounter: Payer: Self-pay | Admitting: Endocrinology

## 2013-12-19 VITALS — BP 132/70 | HR 66 | Temp 98.2°F | Resp 16 | Ht 69.0 in | Wt 226.0 lb

## 2013-12-19 DIAGNOSIS — E1165 Type 2 diabetes mellitus with hyperglycemia: Principal | ICD-10-CM

## 2013-12-19 DIAGNOSIS — IMO0001 Reserved for inherently not codable concepts without codable children: Secondary | ICD-10-CM | POA: Diagnosis not present

## 2013-12-19 DIAGNOSIS — Z23 Encounter for immunization: Secondary | ICD-10-CM | POA: Diagnosis not present

## 2013-12-19 DIAGNOSIS — E785 Hyperlipidemia, unspecified: Secondary | ICD-10-CM

## 2013-12-19 DIAGNOSIS — E039 Hypothyroidism, unspecified: Secondary | ICD-10-CM | POA: Diagnosis not present

## 2013-12-19 MED ORDER — GABAPENTIN 300 MG PO CAPS: 300.0000 mg | ORAL_CAPSULE | Freq: Three times a day (TID) | ORAL | Status: DC

## 2013-12-19 MED ORDER — ONETOUCH DELICA LANCETS FINE MISC: Status: DC

## 2013-12-19 NOTE — Patient Instructions (Addendum)
Please check blood sugars at least half the time about 2 hours after any meal INCLUDING BEDTIME;  4 and times per week on waking up.  Please bring blood sugar monitor to each visit  Take 4 more units of Humalog and Humulin with cereal  LOW FAT MEALS like meats and cheeses  Gabapentin as needed

## 2013-12-19 NOTE — Progress Notes (Signed)
Patient ID: Mark [name], male   DOB: 03/06/1941, 73 y.o.   MRN: [id number]   Reason for Appointment: Diabetes follow-up   History of Present Illness   Diagnosis: Type 2 DIABETES MELITUS, long-standing and insulin-dependent       He has been on basal bolus insulin regimen for several years. Previously had been taking much larger doses of Lantus but is now requiring less since he had lost weight and had intercurrent illnesses He had done very well with Victoza in the past which had helped weight control and glucose but he had nausea and did not want to pay the high cost   Insulin regimen: Humalog 28-30 tid before meals. Humulin 25 a.m. -21 units at 9 PM Recent history:  Because of his difficulty affording Lantus and Humalog insulin he was switched to NPH in 5/15 His A1c is slightly better Currently difficult to adjust his insulin and get an idea of his blood sugar pattern since he is checking blood sugars primarily in the mornings before breakfast; has only 9 readings that are nonfasting in the last month He has marked variability in his morning sugars although recently they have been somewhat better; he is not sure this is related to his meal the night before He has some very consistently high readings after eating cereal in the morning but he still does not take any extra insulin when he does this Mealtime insulin: For some reason he is not taking much more Humalog and doing 30 units; he takes this regardless of his meal size; may take a little less if blood sugars are near normal His A1c has been consistently high since 7/14 He thinks he may occasionally have hypoglycemia after lunch but did not document the low readings  Oral hypoglycemic drugs: none     Proper timing of medications in relation to meals: Yes, he does inject Humalog right before eating when eating out.          Monitors blood glucose:  1.3 times a day.    Glucometer: Verio       Blood Glucose readings: Glucometer  download reviewed:   PREMEAL Breakfast Lunch Dinner Bedtime Overall  Glucose range:  70-275   105-363   141, 240     Mean/median:  166   210    145   Hypoglycemia:  occasionally, about 2 to 3 hours after lunch      Meals: 3 meals per day. for breakfast he will have cereal 1-2/7 days;  sometimes outside meals may be high fat; eating out once a day, no snacks; having dinner 8 pm    Physical activity: exercise: some walking           Wt Readings from Last 3 Encounters:  12/19/13 226 lb (102.513 kg)  09/27/13 227 lb 12.8 oz (103.329 kg)  06/26/13 220 lb 12.8 oz (100.154 kg)     Lab Results  Component Value Date   HGBA1C 7.7* 12/18/2013   HGBA1C 8.0* 09/27/2013   HGBA1C 8.9* 03/16/2013   Lab Results  Component Value Date   MICROALBUR 69.4* 09/27/2013   LDLCALC 88 09/27/2013   CREATININE 2.2* 12/18/2013        Medication List       This list is accurate as of: 12/19/13  2:15 PM.  Always use your most recent med list.               allopurinol 300 MG tablet  Commonly known as:  ZYLOPRIM  TAKE  1/2 TABLET BY MOUTH ONCE DAILY.     BD PEN NEEDLE NANO U/F 32G X 4 MM Misc  Generic drug:  Insulin Pen Needle  USE AS DIRECTED 5 TIMES DAILY SUBQUE.     calcitRIOL 0.5 MCG capsule  Commonly known as:  ROCALTROL  Take one by mouth daily     DIGOX 0.125 MG tablet  Generic drug:  digoxin  Take one by mouth daily     fenofibrate micronized 134 MG capsule  Commonly known as:  LOFIBRA  TAKE ONE CAPSULE EVERY DAY WITH A MEAL     furosemide 80 MG tablet  Commonly known as:  LASIX  Take 2 tabs by mouth every am and 1 every pm     glucose blood test strip  Commonly known as:  ONETOUCH VERIO  Use as instructed to check blood sugar 4 times per day dx code 250.02     HYDROcodone-acetaminophen 5-325 MG per tablet  Commonly known as:  NORCO/VICODIN  Take 1 by mouth every 4-6 hours as needed     HUMALOG KWIKPEN 100 UNIT/ML KiwkPen  Generic drug:  insulin lispro  INJECT 26 UNITS  SUBQUE DAILY WITH MEALS.     insulin lispro 100 unit/ml Soln  Commonly known as:  HUMALOG  Inject into the skin. Use 26 units with meals subq     Insulin NPH (Human) (Isophane) 100 UNIT/ML Kiwkpen  Commonly known as:  HUMULIN N  Start with 25 units in the morning and 21 at bedtime     INSULIN SYRINGE .3CC/31GX5/16" 31G X 5/16" 0.3 ML Misc  Use as directed to inject insulin     ketoconazole 2 % shampoo  Commonly known as:  NIZORAL     levothyroxine 88 MCG tablet  Commonly known as:  SYNTHROID, LEVOTHROID  TAKE ONE TABLET BY MOUTH IN THE MORNING ON AN EMPTY STOMACH     metoprolol succinate 50 MG 24 hr tablet  Commonly known as:  TOPROL-XL  Take one by mouth daily     nystatin 100000 UNIT/GM Powd     omeprazole 20 MG capsule  Commonly known as:  PRILOSEC  Take 20 mg by mouth daily.     ONETOUCH DELICA LANCETS FINE Misc  Use to check blood sugar 4 times per day dx code 250.02     oxyCODONE-acetaminophen 7.5-325 MG per tablet  Commonly known as:  PERCOCET  Take 1 tablet by mouth.     potassium chloride SA 20 MEQ tablet  Commonly known as:  K-DUR,KLOR-CON  Take 2 by mouth two times a day     PROGRAF 0.5 MG capsule  Generic drug:  tacrolimus  Take 0.5 mg by mouth 2 (two) times daily.     RAPAMUNE 0.5 MG Tabs  Generic drug:  Sirolimus  Take by mouth daily.     rosuvastatin 20 MG tablet  Commonly known as:  CRESTOR  Take 1 tablet daily     SSD 1 % cream  Generic drug:  silver sulfADIAZINE  Apply to affected area twice a day     warfarin 5 MG tablet  Commonly known as:  COUMADIN  Take one by mouth daily        Allergies: No Known Allergies  Past Medical History  Diagnosis Date  . Diabetes   . Hypertension   . Gout   . Hypothyroidism     Past Surgical History  Procedure Laterality Date  . Melanoma excision    . Ankle fracture surgery    .  Cataract extraction      No family history on file.  Social History:  reports that he has never smoked. He  has never used smokeless tobacco. He reports that he does not drink alcohol or use illicit drugs.  Review of Systems  Cardiovascular ROS: positive for history of chronic  Atrial fibrillation  Edema is recently not controlled with large doses of Lasix and asking about torsemide. He does not like the last 2 stockings as they are difficult to put on  Mild hypothyroidism,  stable on 88 mcg levothyroxine; compliant with supplement; no recent fatigue  Lab Results  Component Value Date   TSH 1.58 09/27/2013   HYPERLIPIDEMIA: Even though he is taking full doses of fenofibrate along with Crestor his triglycerides are  again high. Previously had intolerance to niacin  His diet can be still relatively high in fat especially at breakfast  Lab Results  Component Value Date   CHOL 172 09/27/2013   HDL 26.70* 09/27/2013   LDLCALC 88 09/27/2013   LDLDIRECT 84.7 05/04/2013   TRIG 285.0* 09/27/2013   CHOLHDL 6 09/27/2013   Numb feet, some stinging sensation present and has not been on any medications for this   Examination:   BP 132/70  Pulse 66  Temp(Src) 98.2 F (36.8 C)  Resp 16  Ht 5\' 9"  (1.753 m)  Wt 226 lb (102.513 kg)  BMI 33.36 kg/m2  SpO2 97%  Body mass index is 33.36 kg/(m^2).   He has marked lower leg edema. Foot exam done recently by a podiatrist  Assesment/ PLAN:  DIABETES: His blood sugars are overall slightly better controlled with switching to NPH insulin and A1c is below 8% now Not clear why he is requiring more insulin to cover his meals rather than basal insulin See history of present illness for discussion on his current blood sugar patterns, problems identified and current management  He is however getting some fluctuation in the morning with some readings over 200 He has variability in his blood sugars during the day but he thinks most of his high readings around lunch time off from eating cereal in the morning He continues to forget to check his sugars after lunch and  supper despite reminders  HYPERLIPIDEMIA: Triglycerides still high and he needs to be watching his fat intake as well as losing weight. Discussed  HYPERTENSION: Mild and well controlled now with metoprolol  Edema: He will followup with nephrologist  Neuropathy: He appears to be symptomatic and will try him on gabapentin. Discussed that he does not interact with his transplant immunosuppression medications  Patient Instructions  Please check blood sugars at least half the time about 2 hours after any meal INCLUDING BEDTIME;  4 and times per week on waking up.  Please bring blood sugar monitor to each visit  Take 4 more units of Humalog and Humulin with cereal  LOW FAT MEALS like meats and cheeses  Gabapentin as needed   Counseling time over 50% of today's 25 minute visit  Mark [name] 12/19/2013, 2:15 PM

## 2013-12-22 ENCOUNTER — Encounter: Payer: Self-pay | Admitting: Podiatry

## 2013-12-22 ENCOUNTER — Ambulatory Visit (INDEPENDENT_AMBULATORY_CARE_PROVIDER_SITE_OTHER): Payer: Medicare Other | Admitting: Podiatry

## 2013-12-22 VITALS — BP 173/66 | HR 65 | Ht 69.0 in | Wt 226.0 lb

## 2013-12-22 DIAGNOSIS — R609 Edema, unspecified: Secondary | ICD-10-CM | POA: Diagnosis not present

## 2013-12-22 DIAGNOSIS — L97509 Non-pressure chronic ulcer of other part of unspecified foot with unspecified severity: Secondary | ICD-10-CM | POA: Diagnosis not present

## 2013-12-22 DIAGNOSIS — R600 Localized edema: Secondary | ICD-10-CM

## 2013-12-22 NOTE — Progress Notes (Signed)
Subjective: Follow up on ulcer 5th digit distal lateral.  Patient was placed in aperture pad on the ulcer. Patient returned with pad intact and drainage through the pad. Noted of healed distal lateral wound with new and larger ulcerated lesion at dorsolateral aspect of the 5th digit right. Stated that he had a person who changed bandage 3 times last week.  Objective: Healed old lesion at distal lateral 5th digit right. New fresh ulcer with opening about 0.8cm diameter and necrotic base. Surrounding tissue is red and inflamed.  Forefoot edema has reduced on right from bandage.   Assessment: Newly formed ulcer dorsolateral aspect 5th digit right foot, 0.8cm in diameter with loss of subdermal layer, deep down to subfascial layer with grey necrotic base.   Plan: Area cleansed with 50% H2O2 and aperture pad applied with 1/4" thickness felt pad. Right foot placed in Surgical shoe to protect the wound.  Return in 3 days since he has no one at home to help change the dressing.

## 2013-12-22 NOTE — Patient Instructions (Signed)
Seen for ulcer on right 5th digit. Old one healed and new lesion appeared with deep necrotic tissue base. Need complete off loading and off pressure. Placed in surgical shoes with aperture pad. Return on Monday.

## 2013-12-25 ENCOUNTER — Encounter: Payer: Self-pay | Admitting: Podiatry

## 2013-12-25 ENCOUNTER — Ambulatory Visit (INDEPENDENT_AMBULATORY_CARE_PROVIDER_SITE_OTHER): Payer: Medicare Other | Admitting: Podiatry

## 2013-12-25 VITALS — BP 187/80 | HR 70 | Ht 69.0 in | Wt 226.0 lb

## 2013-12-25 DIAGNOSIS — R609 Edema, unspecified: Secondary | ICD-10-CM | POA: Diagnosis not present

## 2013-12-25 DIAGNOSIS — L97509 Non-pressure chronic ulcer of other part of unspecified foot with unspecified severity: Secondary | ICD-10-CM | POA: Diagnosis not present

## 2013-12-25 DIAGNOSIS — R600 Localized edema: Secondary | ICD-10-CM

## 2013-12-25 NOTE — Progress Notes (Signed)
Subjective:  73 year old male presents for follow up on ulcer 5th digit distal lateral. He has no one available at this time to change dressing at home and wanted to come to the office for wound care. Stated the the foot was not hurting like was before. He kept bandage on but had problem keeping water off during shower.   Objective: Lesion at dorso lateral 5th digit right has subsided erythema but no change in size, 0.8cm diameter with soft yellow necrotic tissue base. . Forefoot edema is minimum on right.  Right lower limb old wound site no change in color and dry.   Assessment: Subsided inflammation on right 5th digit new ulcer, 0.8cm in diameter with necrotic base.   Plan: Area cleansed with 50% H2O2 and aperture pad applied Silverstat dressing with 1/4" thickness aperture pad to relieve pressure.  Continue in surgical shoes. Return on Wednesday for dressing change.

## 2013-12-25 NOTE — Patient Instructions (Signed)
Follow up on right 5th digit ulcer. Seen some improvement with less inflammation. Will do dressing change every other day. Return on Wednesday.

## 2013-12-26 DIAGNOSIS — Z940 Kidney transplant status: Secondary | ICD-10-CM | POA: Diagnosis not present

## 2013-12-26 DIAGNOSIS — I120 Hypertensive chronic kidney disease with stage 5 chronic kidney disease or end stage renal disease: Secondary | ICD-10-CM | POA: Diagnosis not present

## 2013-12-26 DIAGNOSIS — I4891 Unspecified atrial fibrillation: Secondary | ICD-10-CM | POA: Diagnosis not present

## 2013-12-26 DIAGNOSIS — Z7901 Long term (current) use of anticoagulants: Secondary | ICD-10-CM | POA: Diagnosis not present

## 2013-12-27 ENCOUNTER — Encounter: Payer: Self-pay | Admitting: Podiatry

## 2013-12-27 ENCOUNTER — Ambulatory Visit (INDEPENDENT_AMBULATORY_CARE_PROVIDER_SITE_OTHER): Payer: Medicare Other | Admitting: Podiatry

## 2013-12-27 DIAGNOSIS — L97509 Non-pressure chronic ulcer of other part of unspecified foot with unspecified severity: Secondary | ICD-10-CM

## 2013-12-27 DIAGNOSIS — R600 Localized edema: Secondary | ICD-10-CM

## 2013-12-27 DIAGNOSIS — R609 Edema, unspecified: Secondary | ICD-10-CM

## 2013-12-27 NOTE — Patient Instructions (Signed)
Ulcer 5th digit right showing steady improvement. Return this Friday.

## 2013-12-27 NOTE — Progress Notes (Signed)
Subjective:  73 year old male presents for follow up on ulcer 5th digit distal lateral.  He is taking prescription medication for swelling on limbs and still have much swelling.   Objective: Lesion at dorso lateral 5th digit right has decreased in size, 0.5cm diameter with soft white tissue base. . Increased lower limb edema bilateral.   Assessment: Healing ulcer decreased in size to 0.5cm in diameter with white tisse base.   Plan: Area cleansed with 50% H2O2 and removed excess necrotic tissue.  Aperture pad applied Silverstat dressing with 1/4" thickness aperture pad to relieve pressure.  Continue in surgical shoes.  Return on Frida for dressing change.

## 2013-12-29 ENCOUNTER — Encounter: Payer: Self-pay | Admitting: Podiatry

## 2013-12-29 ENCOUNTER — Ambulatory Visit (INDEPENDENT_AMBULATORY_CARE_PROVIDER_SITE_OTHER): Payer: Medicare Other | Admitting: Podiatry

## 2013-12-29 ENCOUNTER — Ambulatory Visit: Payer: Medicare Other | Admitting: Podiatry

## 2013-12-29 DIAGNOSIS — L97509 Non-pressure chronic ulcer of other part of unspecified foot with unspecified severity: Secondary | ICD-10-CM

## 2013-12-29 DIAGNOSIS — R600 Localized edema: Secondary | ICD-10-CM

## 2013-12-29 DIAGNOSIS — R609 Edema, unspecified: Secondary | ICD-10-CM

## 2013-12-29 NOTE — Progress Notes (Signed)
Subjective:  73 year old male presents for follow up on ulcer 5th digit distal lateral.  The padding has come off on right foot ulcer.   Objective: Pus draining distal lateral lesion under dry callus, 0.6cm.  Lesion at dorso lateral 5th digit right has increased in size and deep down to subdermal layer, 0.8cm.    Assessment Draining ulcer 5th digit distal lateral, 0.6cm.  Increased ulcer size 0.8cm cm in diameter with granula tissue base down to subdermal layer.   Plan: Draining ulcer site cleansed with 50% H2O2 and removed excess necrotic tissue.  Aperture pad applied Silverstat dressing with 1/4" thickness aperture pad to relieve pressure.  Continue in surgical shoes.  Return on Monday for dressing change.

## 2013-12-29 NOTE — Patient Instructions (Signed)
Ulcer wound care done. Return Monday.

## 2014-01-01 ENCOUNTER — Ambulatory Visit (INDEPENDENT_AMBULATORY_CARE_PROVIDER_SITE_OTHER): Payer: Medicare Other | Admitting: Podiatry

## 2014-01-01 ENCOUNTER — Encounter: Payer: Self-pay | Admitting: Podiatry

## 2014-01-01 VITALS — BP 165/72 | HR 72

## 2014-01-01 DIAGNOSIS — R600 Localized edema: Secondary | ICD-10-CM

## 2014-01-01 DIAGNOSIS — L97509 Non-pressure chronic ulcer of other part of unspecified foot with unspecified severity: Secondary | ICD-10-CM | POA: Diagnosis not present

## 2014-01-01 DIAGNOSIS — R609 Edema, unspecified: Secondary | ICD-10-CM

## 2014-01-01 DIAGNOSIS — E1165 Type 2 diabetes mellitus with hyperglycemia: Secondary | ICD-10-CM

## 2014-01-01 DIAGNOSIS — IMO0001 Reserved for inherently not codable concepts without codable children: Secondary | ICD-10-CM

## 2014-01-01 NOTE — Progress Notes (Signed)
Patient presents for right 5th digit ulcer care.  Kept dressing clean and dry intact. Upon removal of dressing, noted of improved ulcer with granulating tissue base. Both distal lateral and distal tip lesions has decreased in size. No redness or drainage. Wound cleansed with 50% H2O2 and Silverstat cream applied with aperture pad. Return this Wednesday.

## 2014-01-01 NOTE — Patient Instructions (Signed)
Noted of improved right 5th toe lesions. Return this Wednesday.

## 2014-01-03 ENCOUNTER — Encounter: Payer: Self-pay | Admitting: Podiatry

## 2014-01-03 ENCOUNTER — Ambulatory Visit (INDEPENDENT_AMBULATORY_CARE_PROVIDER_SITE_OTHER): Payer: Medicare Other | Admitting: Podiatry

## 2014-01-03 DIAGNOSIS — L97509 Non-pressure chronic ulcer of other part of unspecified foot with unspecified severity: Secondary | ICD-10-CM

## 2014-01-03 DIAGNOSIS — R600 Localized edema: Secondary | ICD-10-CM

## 2014-01-03 DIAGNOSIS — R609 Edema, unspecified: Secondary | ICD-10-CM

## 2014-01-03 NOTE — Patient Instructions (Signed)
Noted of wound healing in progress. Return this Friday.

## 2014-01-03 NOTE — Progress Notes (Signed)
Patient returned for follow up on right 5th digit ulcer. Denies any pain. Has much lower limb edema on left side and is currently under medical management.   Objective: Upon removal of dressing, noted of increased granulation at wound base with definitive sign of wound healing. No drainage noted. Base is covered with pink granula tissue with granulating border. Severe left lower limb edema.   Assessment: Wound healing in progress right 5th digit with decreased size and depth.  Plan: Continue current level of care. Area cleansed with 50% H2O2. Debrided devitalized tissue and Silverstat dressing applied with aperture pad.  Return on Friday.

## 2014-01-05 ENCOUNTER — Ambulatory Visit (INDEPENDENT_AMBULATORY_CARE_PROVIDER_SITE_OTHER): Payer: Medicare Other | Admitting: Podiatry

## 2014-01-05 ENCOUNTER — Encounter: Payer: Self-pay | Admitting: Podiatry

## 2014-01-05 VITALS — BP 166/71 | HR 70

## 2014-01-05 DIAGNOSIS — L97312 Non-pressure chronic ulcer of right ankle with fat layer exposed: Secondary | ICD-10-CM

## 2014-01-05 DIAGNOSIS — R609 Edema, unspecified: Secondary | ICD-10-CM

## 2014-01-05 DIAGNOSIS — R600 Localized edema: Secondary | ICD-10-CM

## 2014-01-05 NOTE — Progress Notes (Signed)
Subjective: Follow up visit on ulcer care. Patient returned for follow up on right 5th digit ulcer. Kept dressing intact and kept the foot dry.   Objective:  Upon removal of dressing, noted of shallow wound base with deep skin layer filling in.   No drainage noted.  Ulcer base has changed to shallow dermal layer from subdermal opening. Same severe left lower limb edema.   Assessment: Significant improvement on wound healing right 5th digit with decreased size and depth.   Plan: Continue current level of care. Patient wants to return to the office for continuous wound care. Debrided devitalized tissue and Silverstat dressing applied with aperture pad.  Return on Monday.

## 2014-01-05 NOTE — Patient Instructions (Signed)
Seen for right foot ulcer. Noticeable improvement made. Continue current level of care.

## 2014-01-08 ENCOUNTER — Ambulatory Visit (INDEPENDENT_AMBULATORY_CARE_PROVIDER_SITE_OTHER): Payer: Medicare Other | Admitting: Podiatry

## 2014-01-08 ENCOUNTER — Encounter: Payer: Self-pay | Admitting: Podiatry

## 2014-01-08 DIAGNOSIS — L97311 Non-pressure chronic ulcer of right ankle limited to breakdown of skin: Secondary | ICD-10-CM | POA: Diagnosis not present

## 2014-01-08 DIAGNOSIS — L97319 Non-pressure chronic ulcer of right ankle with unspecified severity: Secondary | ICD-10-CM | POA: Insufficient documentation

## 2014-01-08 DIAGNOSIS — R600 Localized edema: Secondary | ICD-10-CM

## 2014-01-08 DIAGNOSIS — R609 Edema, unspecified: Secondary | ICD-10-CM

## 2014-01-08 DIAGNOSIS — L97401 Non-pressure chronic ulcer of unspecified heel and midfoot limited to breakdown of skin: Secondary | ICD-10-CM

## 2014-01-08 NOTE — Patient Instructions (Signed)
Follow up on right 5th toe ulcer. Healing in progress. Keep bandage dry and return in one week.

## 2014-01-08 NOTE — Progress Notes (Signed)
Subjective:  Follow up visit on 5th digit ulcer right foot.  Patient returned for follow up on right 5th digit ulcer. Kept dressing intact and kept the foot dry.   Objective:  Upon removal of dressing, noted of dry wound base with filling subdermal layer. No drainage noted on dressing.  Good wound healing in progress.  No change in left lower limb edema.   Assessment: Significant improvement on wound healing right 5th digit with decreased size and depth.   Plan: Continue current level of care. Patient wants to return to the office for continuous wound care.  Debrided devitalized tissue and Silverstat dressing applied with aperture pad.

## 2014-01-11 DIAGNOSIS — Z7901 Long term (current) use of anticoagulants: Secondary | ICD-10-CM | POA: Diagnosis not present

## 2014-01-12 DIAGNOSIS — Z1211 Encounter for screening for malignant neoplasm of colon: Secondary | ICD-10-CM | POA: Diagnosis not present

## 2014-01-12 DIAGNOSIS — Z7901 Long term (current) use of anticoagulants: Secondary | ICD-10-CM | POA: Diagnosis not present

## 2014-01-12 DIAGNOSIS — D124 Benign neoplasm of descending colon: Secondary | ICD-10-CM | POA: Diagnosis not present

## 2014-01-12 DIAGNOSIS — Z86010 Personal history of colonic polyps: Secondary | ICD-10-CM | POA: Diagnosis not present

## 2014-01-12 DIAGNOSIS — K635 Polyp of colon: Secondary | ICD-10-CM | POA: Diagnosis not present

## 2014-01-15 ENCOUNTER — Encounter: Payer: Self-pay | Admitting: Podiatry

## 2014-01-15 ENCOUNTER — Ambulatory Visit (INDEPENDENT_AMBULATORY_CARE_PROVIDER_SITE_OTHER): Payer: Medicare Other | Admitting: Podiatry

## 2014-01-15 VITALS — BP 174/69 | HR 70

## 2014-01-15 DIAGNOSIS — R609 Edema, unspecified: Secondary | ICD-10-CM

## 2014-01-15 DIAGNOSIS — L97512 Non-pressure chronic ulcer of other part of right foot with fat layer exposed: Secondary | ICD-10-CM

## 2014-01-15 DIAGNOSIS — R600 Localized edema: Secondary | ICD-10-CM

## 2014-01-15 NOTE — Patient Instructions (Signed)
Seen for right foot ulcer. Doing well. Return this Friday.

## 2014-01-15 NOTE — Progress Notes (Signed)
One week follow up on right 5th digit ulcer.  Ulcer area deepened without enlargement. Diameter limited to 0.5cm. Healing in progress from subdermal layer breakdown. Ulcer site debrided and cleansed with 50% H2O2, Silverstat cream applied with aperture pad.  Patient is to remove dressing the night before his appointment and wash well.  Return this Friday.

## 2014-01-19 ENCOUNTER — Ambulatory Visit (INDEPENDENT_AMBULATORY_CARE_PROVIDER_SITE_OTHER): Payer: Medicare Other | Admitting: Podiatry

## 2014-01-19 ENCOUNTER — Encounter: Payer: Self-pay | Admitting: Podiatry

## 2014-01-19 VITALS — BP 173/80 | HR 77

## 2014-01-19 DIAGNOSIS — R609 Edema, unspecified: Secondary | ICD-10-CM | POA: Diagnosis not present

## 2014-01-19 DIAGNOSIS — R600 Localized edema: Secondary | ICD-10-CM

## 2014-01-19 DIAGNOSIS — L97519 Non-pressure chronic ulcer of other part of right foot with unspecified severity: Secondary | ICD-10-CM

## 2014-01-19 NOTE — Patient Instructions (Signed)
Ulcer wound care done. Return in 5 days.

## 2014-01-19 NOTE — Progress Notes (Signed)
Subjective: One week follow up on right 5th digit ulcer.  Stated that he followed instruction. Kept bandage till today and washed before he came in.  Still wearing surgical shoe.  Objective: Ulcer area is dry with thin scab 5th digit right.  Minimum edema on right foot.   Assessment: Healing in progress.  Plan:  Lesion cleansed with 50% H2O2, Silverstat cream applied with aperture pad.  Patient is to remove dressing the night before his appointment and wash well.  Return next Wednesday.

## 2014-01-23 DIAGNOSIS — Z7901 Long term (current) use of anticoagulants: Secondary | ICD-10-CM | POA: Diagnosis not present

## 2014-01-23 DIAGNOSIS — I4891 Unspecified atrial fibrillation: Secondary | ICD-10-CM | POA: Diagnosis not present

## 2014-01-24 ENCOUNTER — Ambulatory Visit (INDEPENDENT_AMBULATORY_CARE_PROVIDER_SITE_OTHER): Payer: Medicare Other | Admitting: Podiatry

## 2014-01-24 ENCOUNTER — Encounter: Payer: Self-pay | Admitting: Podiatry

## 2014-01-24 DIAGNOSIS — L97519 Non-pressure chronic ulcer of other part of right foot with unspecified severity: Secondary | ICD-10-CM | POA: Diagnosis not present

## 2014-01-24 NOTE — Progress Notes (Signed)
Subjective:  One week follow up on right 5th digit ulcer.  Stated that the bandage came off the day before.  Still wearing surgical shoe.  Objective:  Ulcer area is still open with about 11mm  with wet base. Drainage minimum. No edema or erythema.  Minimum edema on right foot.   Assessment:  Healing in progress.   Plan:  Lesion cleansed with 50% H2O2, Silverstat cream applied with aperture pad.  Patient is to remove dressing the night before his appointment and wash well.  Return next Monday.

## 2014-01-24 NOTE — Patient Instructions (Signed)
Wound healing in progress. Return in one week.

## 2014-01-29 ENCOUNTER — Encounter: Payer: Self-pay | Admitting: Podiatry

## 2014-01-29 ENCOUNTER — Ambulatory Visit (INDEPENDENT_AMBULATORY_CARE_PROVIDER_SITE_OTHER): Payer: Medicare Other | Admitting: Podiatry

## 2014-01-29 DIAGNOSIS — L97519 Non-pressure chronic ulcer of other part of right foot with unspecified severity: Secondary | ICD-10-CM

## 2014-01-29 NOTE — Patient Instructions (Signed)
Doing well with wound care. Improving. Return this Friday.

## 2014-01-29 NOTE — Progress Notes (Signed)
Subjective:  73 year old male presents for one week follow up on right 5th digit ulcer.  He has kept dressing on till today.  Past history include slow healing leg ulcer and kidney transplant.  Objective:  Ulcer area is closing in with 18mm wet base.  No drainage, no edema or erythema.  Under the right foot where skin came off with tape last week healing well.  Minimum edema on right foot.   Assessment:  Satisfactory wound healing in progress.   Plan:  Lesion cleansed with 50% H2O2, Silverstat cream applied with aperture pad.  Patient wants to come in this Friday to change dressing lighter to fit in dress shoes. He will be traveling this Weekend.

## 2014-01-31 DIAGNOSIS — Z940 Kidney transplant status: Secondary | ICD-10-CM | POA: Diagnosis not present

## 2014-02-01 ENCOUNTER — Other Ambulatory Visit: Payer: Self-pay | Admitting: *Deleted

## 2014-02-01 ENCOUNTER — Telehealth: Payer: Self-pay | Admitting: Endocrinology

## 2014-02-01 MED ORDER — INSULIN LISPRO 100 UNIT/ML (KWIKPEN): PEN_INJECTOR | SUBCUTANEOUS | Status: DC

## 2014-02-01 MED ORDER — INSULIN ISOPHANE HUMAN 100 UNIT/ML KWIKPEN: PEN_INJECTOR | SUBCUTANEOUS | Status: DC

## 2014-02-01 NOTE — Telephone Encounter (Signed)
Pt needs to ask about humalog and humulin please call

## 2014-02-02 ENCOUNTER — Encounter: Payer: Self-pay | Admitting: Podiatry

## 2014-02-02 ENCOUNTER — Ambulatory Visit (INDEPENDENT_AMBULATORY_CARE_PROVIDER_SITE_OTHER): Payer: Medicare Other | Admitting: Podiatry

## 2014-02-02 DIAGNOSIS — M79606 Pain in leg, unspecified: Secondary | ICD-10-CM | POA: Insufficient documentation

## 2014-02-02 DIAGNOSIS — B351 Tinea unguium: Secondary | ICD-10-CM | POA: Diagnosis not present

## 2014-02-02 NOTE — Progress Notes (Signed)
Subjective:  73 year old male presents for one week follow up on right 5th digit ulcer.  He has kept dressing undisturbed.   Objective:  Ulcer area 5th digit right has closed off and healed. No drainage, no edema or erythema.  Minimum edema on both feet. Hypertrophic nails x 10.  Assessment:  Ulcer wound healing completed on 5th digit right. Mycotic nails x 10.  Plan:  Both feet cleansed with Alcohol. Aperture pad placed on 5th digit right. All nails debrided. Return in 2 weeks to prevent recurrence of the ulcer.

## 2014-02-02 NOTE — Patient Instructions (Signed)
Doing well with ulcer. Healed well. Return in 2 weeks.

## 2014-02-06 DIAGNOSIS — H02403 Unspecified ptosis of bilateral eyelids: Secondary | ICD-10-CM | POA: Diagnosis not present

## 2014-02-06 DIAGNOSIS — H02831 Dermatochalasis of right upper eyelid: Secondary | ICD-10-CM | POA: Diagnosis not present

## 2014-02-06 DIAGNOSIS — H02833 Dermatochalasis of right eye, unspecified eyelid: Secondary | ICD-10-CM | POA: Diagnosis not present

## 2014-02-06 DIAGNOSIS — H02836 Dermatochalasis of left eye, unspecified eyelid: Secondary | ICD-10-CM | POA: Diagnosis not present

## 2014-02-06 DIAGNOSIS — Z794 Long term (current) use of insulin: Secondary | ICD-10-CM | POA: Diagnosis not present

## 2014-02-06 DIAGNOSIS — H02834 Dermatochalasis of left upper eyelid: Secondary | ICD-10-CM | POA: Diagnosis not present

## 2014-02-06 DIAGNOSIS — Z7901 Long term (current) use of anticoagulants: Secondary | ICD-10-CM | POA: Diagnosis not present

## 2014-02-06 DIAGNOSIS — I4891 Unspecified atrial fibrillation: Secondary | ICD-10-CM | POA: Diagnosis not present

## 2014-02-07 DIAGNOSIS — I120 Hypertensive chronic kidney disease with stage 5 chronic kidney disease or end stage renal disease: Secondary | ICD-10-CM | POA: Diagnosis not present

## 2014-02-07 DIAGNOSIS — Z940 Kidney transplant status: Secondary | ICD-10-CM | POA: Diagnosis not present

## 2014-02-13 DIAGNOSIS — R609 Edema, unspecified: Secondary | ICD-10-CM | POA: Diagnosis not present

## 2014-02-13 DIAGNOSIS — Z940 Kidney transplant status: Secondary | ICD-10-CM | POA: Diagnosis not present

## 2014-02-13 DIAGNOSIS — Z79899 Other long term (current) drug therapy: Secondary | ICD-10-CM | POA: Diagnosis not present

## 2014-02-13 DIAGNOSIS — H35371 Puckering of macula, right eye: Secondary | ICD-10-CM | POA: Diagnosis not present

## 2014-02-13 DIAGNOSIS — E11359 Type 2 diabetes mellitus with proliferative diabetic retinopathy without macular edema: Secondary | ICD-10-CM | POA: Diagnosis not present

## 2014-02-13 DIAGNOSIS — E119 Type 2 diabetes mellitus without complications: Secondary | ICD-10-CM | POA: Diagnosis not present

## 2014-02-16 ENCOUNTER — Ambulatory Visit (INDEPENDENT_AMBULATORY_CARE_PROVIDER_SITE_OTHER): Payer: Medicare Other | Admitting: Podiatry

## 2014-02-16 ENCOUNTER — Encounter: Payer: Self-pay | Admitting: Podiatry

## 2014-02-16 VITALS — BP 158/67 | HR 61

## 2014-02-16 DIAGNOSIS — L97519 Non-pressure chronic ulcer of other part of right foot with unspecified severity: Secondary | ICD-10-CM | POA: Diagnosis not present

## 2014-02-16 DIAGNOSIS — M2041 Other hammer toe(s) (acquired), right foot: Secondary | ICD-10-CM | POA: Diagnosis not present

## 2014-02-16 NOTE — Patient Instructions (Signed)
Seen for right foot lesion. Has healed well. Keep the bandage till 2 days before the next appointment.   Return in 2 weeks.

## 2014-02-16 NOTE — Progress Notes (Signed)
Subjective:  73 year old male presents for one week follow up on right 5th digit ulcer.  Denies any problem.   Objective:  Ulcer area 5th digit right has healed. No drainage, no edema or erythema.  Minimum edema on both feet.  Assessment:  Ulcer wound healing completed on 5th digit right. Mycotic nails x 10.  Plan:  Both feet cleansed with Alcohol. Aperture pad placed on 5th digit right. All nails debrided. Return in 2 weeks to prevent recurrence of the ulcer.

## 2014-02-21 DIAGNOSIS — Z7901 Long term (current) use of anticoagulants: Secondary | ICD-10-CM | POA: Diagnosis not present

## 2014-02-24 ENCOUNTER — Other Ambulatory Visit: Payer: Self-pay | Admitting: Endocrinology

## 2014-02-27 DIAGNOSIS — Z85828 Personal history of other malignant neoplasm of skin: Secondary | ICD-10-CM | POA: Diagnosis not present

## 2014-02-27 DIAGNOSIS — L03311 Cellulitis of abdominal wall: Secondary | ICD-10-CM | POA: Diagnosis not present

## 2014-02-27 DIAGNOSIS — Z08 Encounter for follow-up examination after completed treatment for malignant neoplasm: Secondary | ICD-10-CM | POA: Diagnosis not present

## 2014-02-27 DIAGNOSIS — L03116 Cellulitis of left lower limb: Secondary | ICD-10-CM | POA: Diagnosis not present

## 2014-02-27 DIAGNOSIS — L570 Actinic keratosis: Secondary | ICD-10-CM | POA: Diagnosis not present

## 2014-02-28 ENCOUNTER — Ambulatory Visit (INDEPENDENT_AMBULATORY_CARE_PROVIDER_SITE_OTHER): Payer: Medicare Other | Admitting: Podiatry

## 2014-02-28 ENCOUNTER — Encounter: Payer: Self-pay | Admitting: Podiatry

## 2014-02-28 ENCOUNTER — Ambulatory Visit: Payer: Medicare Other | Admitting: Podiatry

## 2014-02-28 DIAGNOSIS — L97519 Non-pressure chronic ulcer of other part of right foot with unspecified severity: Secondary | ICD-10-CM

## 2014-02-28 NOTE — Patient Instructions (Signed)
5th toe right wound healing is complete. Use pad while wearing shoes. Return in 3 weeks.

## 2014-02-28 NOTE — Progress Notes (Signed)
Subjective:  73 year old male presents for two week follow up on right 5th digit ulcer.  Denies any problem.   Objective:  Ulcer area 5th digit right has been healed. No drainage, no edema or erythema.  Positive of bilateral ankle edema.   Assessment:  Ulcer wound healing completed on 5th digit right.  Plan:  Added aperture pad on 5th digit right with instruction to keep it while wearing closed in shoes. Return in 3 weeks to monitor.

## 2014-03-08 DIAGNOSIS — Z940 Kidney transplant status: Secondary | ICD-10-CM | POA: Diagnosis not present

## 2014-03-10 ENCOUNTER — Other Ambulatory Visit: Payer: Self-pay | Admitting: Endocrinology

## 2014-03-15 ENCOUNTER — Other Ambulatory Visit: Payer: Medicare Other

## 2014-03-15 DIAGNOSIS — Z794 Long term (current) use of insulin: Secondary | ICD-10-CM | POA: Diagnosis not present

## 2014-03-15 DIAGNOSIS — R609 Edema, unspecified: Secondary | ICD-10-CM | POA: Diagnosis not present

## 2014-03-15 DIAGNOSIS — I120 Hypertensive chronic kidney disease with stage 5 chronic kidney disease or end stage renal disease: Secondary | ICD-10-CM | POA: Diagnosis not present

## 2014-03-15 DIAGNOSIS — R809 Proteinuria, unspecified: Secondary | ICD-10-CM | POA: Diagnosis not present

## 2014-03-15 DIAGNOSIS — E119 Type 2 diabetes mellitus without complications: Secondary | ICD-10-CM | POA: Diagnosis not present

## 2014-03-15 DIAGNOSIS — E079 Disorder of thyroid, unspecified: Secondary | ICD-10-CM | POA: Diagnosis not present

## 2014-03-15 DIAGNOSIS — Z940 Kidney transplant status: Secondary | ICD-10-CM | POA: Diagnosis not present

## 2014-03-15 DIAGNOSIS — Z7901 Long term (current) use of anticoagulants: Secondary | ICD-10-CM | POA: Diagnosis not present

## 2014-03-15 DIAGNOSIS — N186 End stage renal disease: Secondary | ICD-10-CM | POA: Diagnosis not present

## 2014-03-15 DIAGNOSIS — Z4822 Encounter for aftercare following kidney transplant: Secondary | ICD-10-CM | POA: Diagnosis not present

## 2014-03-15 DIAGNOSIS — I4891 Unspecified atrial fibrillation: Secondary | ICD-10-CM | POA: Diagnosis not present

## 2014-03-15 DIAGNOSIS — D8989 Other specified disorders involving the immune mechanism, not elsewhere classified: Secondary | ICD-10-CM | POA: Diagnosis not present

## 2014-03-16 ENCOUNTER — Other Ambulatory Visit (INDEPENDENT_AMBULATORY_CARE_PROVIDER_SITE_OTHER): Payer: Medicare Other

## 2014-03-16 DIAGNOSIS — E1165 Type 2 diabetes mellitus with hyperglycemia: Secondary | ICD-10-CM | POA: Diagnosis not present

## 2014-03-16 DIAGNOSIS — E039 Hypothyroidism, unspecified: Secondary | ICD-10-CM | POA: Diagnosis not present

## 2014-03-16 DIAGNOSIS — IMO0002 Reserved for concepts with insufficient information to code with codable children: Secondary | ICD-10-CM

## 2014-03-16 LAB — COMPREHENSIVE METABOLIC PANEL
ALT: 25 U/L (ref 0–53)
AST: 43 U/L — AB (ref 0–37)
Albumin: 3.7 g/dL (ref 3.5–5.2)
Alkaline Phosphatase: 31 U/L — ABNORMAL LOW (ref 39–117)
BUN: 48 mg/dL — AB (ref 6–23)
CALCIUM: 9 mg/dL (ref 8.4–10.5)
CHLORIDE: 97 meq/L (ref 96–112)
CO2: 25 mEq/L (ref 19–32)
Creatinine, Ser: 3.5 mg/dL — ABNORMAL HIGH (ref 0.4–1.5)
GFR: 18.29 mL/min — AB (ref >60.00)
Glucose, Bld: 128 mg/dL — ABNORMAL HIGH (ref 70–99)
POTASSIUM: 3.6 meq/L (ref 3.5–5.1)
Sodium: 133 mEq/L — ABNORMAL LOW (ref 135–145)
Total Bilirubin: 0.7 mg/dL (ref 0.2–1.2)
Total Protein: 7.4 g/dL (ref 6.0–8.3)

## 2014-03-16 LAB — HEMOGLOBIN A1C: Hgb A1c MFr Bld: 8.6 % — ABNORMAL HIGH (ref 4.6–6.5)

## 2014-03-16 LAB — MICROALBUMIN / CREATININE URINE RATIO
CREATININE, U: 58.3 mg/dL
MICROALB UR: 24.5 mg/dL — AB (ref 0.0–1.9)
Microalb Creat Ratio: 42 mg/g — ABNORMAL HIGH (ref 0.0–30.0)

## 2014-03-16 LAB — TSH: TSH: 1.17 u[IU]/mL (ref 0.35–4.50)

## 2014-03-20 ENCOUNTER — Ambulatory Visit (INDEPENDENT_AMBULATORY_CARE_PROVIDER_SITE_OTHER): Payer: Medicare Other | Admitting: Podiatry

## 2014-03-20 ENCOUNTER — Encounter: Payer: Self-pay | Admitting: Endocrinology

## 2014-03-20 ENCOUNTER — Ambulatory Visit (INDEPENDENT_AMBULATORY_CARE_PROVIDER_SITE_OTHER): Payer: Medicare Other | Admitting: Endocrinology

## 2014-03-20 ENCOUNTER — Encounter: Payer: Self-pay | Admitting: Podiatry

## 2014-03-20 VITALS — BP 142/88 | HR 79 | Temp 98.0°F | Resp 16 | Ht 69.0 in | Wt 219.4 lb

## 2014-03-20 DIAGNOSIS — M79606 Pain in leg, unspecified: Secondary | ICD-10-CM

## 2014-03-20 DIAGNOSIS — E038 Other specified hypothyroidism: Secondary | ICD-10-CM

## 2014-03-20 DIAGNOSIS — R609 Edema, unspecified: Secondary | ICD-10-CM

## 2014-03-20 DIAGNOSIS — E1149 Type 2 diabetes mellitus with other diabetic neurological complication: Secondary | ICD-10-CM | POA: Diagnosis not present

## 2014-03-20 DIAGNOSIS — E1165 Type 2 diabetes mellitus with hyperglycemia: Principal | ICD-10-CM

## 2014-03-20 DIAGNOSIS — B351 Tinea unguium: Secondary | ICD-10-CM

## 2014-03-20 DIAGNOSIS — E063 Autoimmune thyroiditis: Secondary | ICD-10-CM

## 2014-03-20 DIAGNOSIS — R600 Localized edema: Secondary | ICD-10-CM

## 2014-03-20 DIAGNOSIS — IMO0002 Reserved for concepts with insufficient information to code with codable children: Secondary | ICD-10-CM

## 2014-03-20 NOTE — Patient Instructions (Signed)
Doing well and ulcer has healed. All nails debrided. Return in one month.

## 2014-03-20 NOTE — Patient Instructions (Addendum)
Humulin 23 units in am and 20 at bedtime  Breakfast: egg and Eng. Muffin  Please check blood sugars at least half the time about 2 hours after any meal and 3 times per week on waking up. Please bring blood sugar monitor to each visit

## 2014-03-20 NOTE — Progress Notes (Signed)
Subjective:  73 year old male presents for three week follow up on right 5th digit ulcer.  Patient requests toe nails trimmed.  Objective:  Ulcer area 5th digit right has been healed. No drainage, no edema or erythema.  Positive of bilateral ankle edema.  Thick dystropic nails x 10.  Assessment:  Ulcer wound healing completed on 5th digit right. Onychomycosis x 10. Painful feet.   Plan:  Added aperture pad on 5th digit right with instruction to keep it while wearing closed in shoes. Return in 4 weeks to monitor.

## 2014-03-20 NOTE — Progress Notes (Signed)
Patient ID: Mark [name], male   DOB: Nov 28, 1940, 73 y.o.   MRN: [id number]   Reason for Appointment: Diabetes follow-up   History of Present Illness   Diagnosis: Type 2 DIABETES MELITUS, long-standing and insulin-dependent       He has been on basal bolus insulin regimen for several years. Previously had been taking much larger doses of Lantus but is now requiring less since he had lost weight and had intercurrent illnesses He had done very well with Victoza in the past which had helped weight control and glucose but he had nausea and did not want to pay the high cost   Insulin regimen: Humalog 28-30 tid before meals. Humulin 25 a.m. -21 units at 9 PM Recent history:  Because of his difficulty affording Lantus and Humalog insulin he was switched to NPH in 5/15 His A1c appears to have increased significantly Although he has been on prednisone this is only 5 mg and not clear why his sugars have been higher Current blood sugar patterns:  His fasting blood sugars have been consistently high until about 03/12/14 with readings as high as 289.  Subsequently his fasting blood sugars have improved significantly with readings as low as 75 in the last week or so  He is checking blood sugars sporadically in the afternoon, probably mostly before eating and these are mildly increased except on 12/12 and he had a glucose of 67  Blood sugar before supper time have been variable and high only once; lowest reading 66  Again despite reminders he is not checking sugars after supper and has only one high reading of 309 late at night  Overall blood sugar is averaging 170 for the last month  Hypoglycemia:  Has had 3 readings in the 60s at 2 PM or 8 PM recently  He has marked variability in his morning sugars although recently they have been somewhat better; he is not sure this is related to his meal the night before He has some very consistently high readings after eating cereal in the morning but he  still does not take any extra insulin when he does this Mealtime insulin: he is still taking the same amount of insulin even recently even though pre-meal blood sugars appear to be better.  Usually compliant with taking insulin when eating out but does not adjust the dose if he is eating a higher calorie are higher fat meals His A1c has been consistently high since 7/14 and at the highest level now  Oral hypoglycemic drugs: none     Proper timing of medications in relation to meals: Yes, he does inject Humalog right before meals.          Monitors blood glucose:  1.3 times a day.    Glucometer: Verio       Blood Glucose readings: Glucometer download reviewed:   PRE-MEAL Breakfast Lunch Dinner Bedtime Overall  Glucose range: 75-289 67-227 66-234    median: 200  113  155        Meals: 3 meals per day. for breakfast he will have frozen meat croissant; cereal 1-2/7 days;  sometimes outside meals may be high fat; eating out once a day, no snacks; having dinner 8 pm    Physical activity: exercise: some walking           Wt Readings from Last 3 Encounters:  03/20/14 219 lb 6.4 oz (99.519 kg)  12/25/13 226 lb (102.513 kg)  12/22/13 226 lb (102.513 kg)  Lab Results  Component Value Date   HGBA1C 8.6* 03/16/2014   HGBA1C 7.7* 12/18/2013   HGBA1C 8.0* 09/27/2013   Lab Results  Component Value Date   MICROALBUR 24.5* 03/16/2014   LDLCALC 88 09/27/2013   CREATININE 3.5* 03/16/2014        Medication List       This list is accurate as of: 03/20/14  5:20 PM.  Always use your most recent med list.               allopurinol 300 MG tablet  Commonly known as:  ZYLOPRIM  TAKE 1/2 TABLET BY MOUTH ONCE DAILY.     BD PEN NEEDLE NANO U/F 32G X 4 MM Misc  Generic drug:  Insulin Pen Needle  USE AS DIRECTED 5 TIMES DAILY SUBQUE.     calcitRIOL 0.5 MCG capsule  Commonly known as:  ROCALTROL  Take one by mouth daily     DIGOX 0.125 MG tablet  Generic drug:  digoxin  Take one by  mouth daily     fenofibrate micronized 134 MG capsule  Commonly known as:  LOFIBRA  TAKE ONE CAPSULE EVERY DAY WITH A MEAL     furosemide 80 MG tablet  Commonly known as:  LASIX  Take 2 tabs by mouth every am and 1 every pm     gabapentin 300 MG capsule  Commonly known as:  NEURONTIN  Take 1 capsule (300 mg total) by mouth 3 (three) times daily.     HYDROcodone-acetaminophen 5-325 MG per tablet  Commonly known as:  NORCO/VICODIN  Take 1 by mouth every 4-6 hours as needed     insulin lispro 100 unit/ml Soln  Commonly known as:  HUMALOG  Inject into the skin. Use 26 units with meals subq     Insulin NPH (Human) (Isophane) 100 UNIT/ML Kiwkpen  Commonly known as:  HUMULIN N  Start with 25 units in the morning and 21 at bedtime     INSULIN SYRINGE .3CC/31GX5/16" 31G X 5/16" 0.3 ML Misc  Use as directed to inject insulin     ketoconazole 2 % shampoo  Commonly known as:  NIZORAL     levothyroxine 88 MCG tablet  Commonly known as:  SYNTHROID, LEVOTHROID  TAKE ONE TABLET BY MOUTH IN THE MORNING ON AN EMPTY STOMACH     metolazone 2.5 MG tablet  Commonly known as:  ZAROXOLYN     metoprolol succinate 50 MG 24 hr tablet  Commonly known as:  TOPROL-XL  Take one by mouth daily     mupirocin ointment 2 %  Commonly known as:  BACTROBAN     nystatin 100000 UNIT/GM Powd     omeprazole 20 MG capsule  Commonly known as:  PRILOSEC  Take 20 mg by mouth daily.     ONETOUCH DELICA LANCETS FINE Misc  Use to check blood sugar 4 times per day dx code 250.02     ONETOUCH VERIO test strip  Generic drug:  glucose blood  USE AS INSTRUCTED TO CHECK BLOOD SUGAR 4 TIMES A DAY     oxyCODONE-acetaminophen 7.5-325 MG per tablet  Commonly known as:  PERCOCET  Take 1 tablet by mouth.     PHOSPHA 250 NEUTRAL 155-852-130 MG tablet  Generic drug:  phosphorus     potassium chloride SA 20 MEQ tablet  Commonly known as:  K-DUR,KLOR-CON  Take 2 by mouth two times a day     predniSONE 5 MG  tablet  Commonly known as:  DELTASONE  Take 5 mg by mouth daily.     PROGRAF 0.5 MG capsule  Generic drug:  tacrolimus  Take 0.5 mg by mouth 2 (two) times daily.     RAPAMUNE 0.5 MG Tabs  Generic drug:  Sirolimus  Take by mouth daily.     rosuvastatin 20 MG tablet  Commonly known as:  CRESTOR  Take 1 tablet daily     SSD 1 % cream  Generic drug:  silver sulfADIAZINE  Apply to affected area twice a day     warfarin 5 MG tablet  Commonly known as:  COUMADIN  Take one by mouth daily        Allergies: No Known Allergies  Past Medical History  Diagnosis Date  . Diabetes   . Hypertension   . Gout   . Hypothyroidism   . Ulcer of other part of foot 12/15/2013    Past Surgical History  Procedure Laterality Date  . Melanoma excision    . Ankle fracture surgery    . Cataract extraction      No family history on file.  Social History:  reports that he has never smoked. He has never used smokeless tobacco. He reports that he does not drink alcohol or use illicit drugs.  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 Previously had intolerance to niacin  His diet can be still relatively high in fat if eating out  Lab Results  Component Value Date   CHOL 172 09/27/2013   HDL 26.70* 09/27/2013   LDLCALC 88 09/27/2013    LDLDIRECT 84.7 05/04/2013   TRIG 285.0* 09/27/2013   CHOLHDL 6 09/27/2013   Numb feet, some stinging sensation present and better with gabapentin as needed started in 9/15   Examination:   BP 142/88 mmHg  Pulse 79  Temp(Src) 98 F (36.7 C)  Resp 16  Ht 5\' 9"  (1.753 m)  Wt 219 lb 6.4 oz (99.519 kg)  BMI 32.38 kg/m2  SpO2 98%  Body mass index is 32.38 kg/(m^2).    2+ ankle edema present  Assesment/ PLAN:  DIABETES: His blood sugars are overall still high and A1c has increased significantly He has the most difficulty controlling his fasting blood sugars He does not adjust his bedtime NPH based on fasting readings Blood sugars are improving recently probably from worsening renal function  Since his blood sugars are mostly near normal or slightly low before breakfast and supper he probably needs less basal insulin He still needs relatively high doses of mealtime coverage but difficult to assess since he does not monitor postprandially He will adjust Humalog dose based on postprandial readings Also discussed reducing high fat foods at breakfast time Does have symptoms with hypoglycemia usually  See history of present illness for detailed discussion on his current blood sugar patterns, problems identified and current  management  HYPERTENSION: somewhat worse now and may be related to renal dysfunction, managed by nephrologist   Neuropathy: He appears to be getting relief with gabapentin which he is taking as needed   Patient Instructions  Humulin 23 units in am and 20 at bedtime  Breakfast: egg and Eng. Muffin  Please check blood sugars at least half the time about 2 hours after any meal and 3 times per week on waking up. Please bring blood sugar monitor to each visit    Counseling time over 50% of today's 25 minute visit     Mahoganie Basher 03/20/2014, 5:20 PM

## 2014-03-21 DIAGNOSIS — Z940 Kidney transplant status: Secondary | ICD-10-CM | POA: Diagnosis not present

## 2014-03-21 DIAGNOSIS — I120 Hypertensive chronic kidney disease with stage 5 chronic kidney disease or end stage renal disease: Secondary | ICD-10-CM | POA: Diagnosis not present

## 2014-03-22 DIAGNOSIS — M7989 Other specified soft tissue disorders: Secondary | ICD-10-CM | POA: Diagnosis not present

## 2014-03-22 DIAGNOSIS — Z5181 Encounter for therapeutic drug level monitoring: Secondary | ICD-10-CM | POA: Diagnosis not present

## 2014-03-22 DIAGNOSIS — I4891 Unspecified atrial fibrillation: Secondary | ICD-10-CM | POA: Diagnosis not present

## 2014-03-22 DIAGNOSIS — D899 Disorder involving the immune mechanism, unspecified: Secondary | ICD-10-CM | POA: Diagnosis not present

## 2014-03-22 DIAGNOSIS — E119 Type 2 diabetes mellitus without complications: Secondary | ICD-10-CM | POA: Diagnosis not present

## 2014-03-22 DIAGNOSIS — R7989 Other specified abnormal findings of blood chemistry: Secondary | ICD-10-CM | POA: Diagnosis not present

## 2014-03-22 DIAGNOSIS — Z4822 Encounter for aftercare following kidney transplant: Secondary | ICD-10-CM | POA: Diagnosis not present

## 2014-03-22 DIAGNOSIS — Z79899 Other long term (current) drug therapy: Secondary | ICD-10-CM | POA: Diagnosis not present

## 2014-03-22 DIAGNOSIS — N186 End stage renal disease: Secondary | ICD-10-CM | POA: Diagnosis not present

## 2014-03-22 DIAGNOSIS — Z940 Kidney transplant status: Secondary | ICD-10-CM | POA: Diagnosis not present

## 2014-03-27 DIAGNOSIS — Z08 Encounter for follow-up examination after completed treatment for malignant neoplasm: Secondary | ICD-10-CM | POA: Diagnosis not present

## 2014-03-27 DIAGNOSIS — E119 Type 2 diabetes mellitus without complications: Secondary | ICD-10-CM | POA: Diagnosis not present

## 2014-03-27 DIAGNOSIS — Z4822 Encounter for aftercare following kidney transplant: Secondary | ICD-10-CM | POA: Diagnosis not present

## 2014-03-27 DIAGNOSIS — Z940 Kidney transplant status: Secondary | ICD-10-CM | POA: Diagnosis not present

## 2014-03-27 DIAGNOSIS — E876 Hypokalemia: Secondary | ICD-10-CM | POA: Diagnosis not present

## 2014-03-27 DIAGNOSIS — Z7901 Long term (current) use of anticoagulants: Secondary | ICD-10-CM | POA: Diagnosis not present

## 2014-03-27 DIAGNOSIS — Z794 Long term (current) use of insulin: Secondary | ICD-10-CM | POA: Diagnosis not present

## 2014-03-27 DIAGNOSIS — D235 Other benign neoplasm of skin of trunk: Secondary | ICD-10-CM | POA: Diagnosis not present

## 2014-03-27 DIAGNOSIS — D899 Disorder involving the immune mechanism, unspecified: Secondary | ICD-10-CM | POA: Diagnosis not present

## 2014-03-27 DIAGNOSIS — D225 Melanocytic nevi of trunk: Secondary | ICD-10-CM | POA: Diagnosis not present

## 2014-03-27 DIAGNOSIS — E785 Hyperlipidemia, unspecified: Secondary | ICD-10-CM | POA: Diagnosis not present

## 2014-03-27 DIAGNOSIS — D485 Neoplasm of uncertain behavior of skin: Secondary | ICD-10-CM | POA: Diagnosis not present

## 2014-03-27 DIAGNOSIS — D8989 Other specified disorders involving the immune mechanism, not elsewhere classified: Secondary | ICD-10-CM | POA: Diagnosis not present

## 2014-03-27 DIAGNOSIS — I4891 Unspecified atrial fibrillation: Secondary | ICD-10-CM | POA: Diagnosis not present

## 2014-03-27 DIAGNOSIS — I120 Hypertensive chronic kidney disease with stage 5 chronic kidney disease or end stage renal disease: Secondary | ICD-10-CM | POA: Diagnosis not present

## 2014-03-27 DIAGNOSIS — N186 End stage renal disease: Secondary | ICD-10-CM | POA: Diagnosis not present

## 2014-03-27 DIAGNOSIS — Z85828 Personal history of other malignant neoplasm of skin: Secondary | ICD-10-CM | POA: Diagnosis not present

## 2014-04-17 ENCOUNTER — Encounter: Payer: Self-pay | Admitting: Podiatry

## 2014-04-17 ENCOUNTER — Ambulatory Visit (INDEPENDENT_AMBULATORY_CARE_PROVIDER_SITE_OTHER): Payer: Medicare Other | Admitting: Podiatry

## 2014-04-17 DIAGNOSIS — M2041 Other hammer toe(s) (acquired), right foot: Secondary | ICD-10-CM

## 2014-04-17 DIAGNOSIS — B351 Tinea unguium: Secondary | ICD-10-CM | POA: Diagnosis not present

## 2014-04-17 DIAGNOSIS — M79606 Pain in leg, unspecified: Secondary | ICD-10-CM | POA: Diagnosis not present

## 2014-04-17 NOTE — Progress Notes (Signed)
Subjective:  74 year old male presents for three week follow up on right 5th digit ulcer.   Objective:  Ulcer area 5th digit right has been healed. No drainage, no edema or erythema.  Positive of bilateral ankle edema.  Thick dystropic nails x 10.  Assessment:  Ulcer wound healing completed on 5th digit right. Onychomycosis x 10. Painful feet.   Plan:  Added aperture pad on 5th digit right with instruction to keep it while wearing closed in shoes. Return in 4 weeks to monitor.

## 2014-04-17 NOTE — Patient Instructions (Signed)
One month follow up on right 5th digit. Doing well. Debrided and padded. Return in 6 weeks.

## 2014-04-26 DIAGNOSIS — Z940 Kidney transplant status: Secondary | ICD-10-CM | POA: Diagnosis not present

## 2014-05-10 DIAGNOSIS — Z940 Kidney transplant status: Secondary | ICD-10-CM | POA: Diagnosis not present

## 2014-05-10 DIAGNOSIS — I120 Hypertensive chronic kidney disease with stage 5 chronic kidney disease or end stage renal disease: Secondary | ICD-10-CM | POA: Diagnosis not present

## 2014-05-11 DIAGNOSIS — I10 Essential (primary) hypertension: Secondary | ICD-10-CM | POA: Diagnosis not present

## 2014-05-11 DIAGNOSIS — H0231 Blepharochalasis right upper eyelid: Secondary | ICD-10-CM | POA: Diagnosis not present

## 2014-05-11 DIAGNOSIS — Z79899 Other long term (current) drug therapy: Secondary | ICD-10-CM | POA: Diagnosis not present

## 2014-05-11 DIAGNOSIS — H0234 Blepharochalasis left upper eyelid: Secondary | ICD-10-CM | POA: Diagnosis not present

## 2014-05-11 DIAGNOSIS — H02831 Dermatochalasis of right upper eyelid: Secondary | ICD-10-CM | POA: Diagnosis not present

## 2014-05-11 DIAGNOSIS — E11359 Type 2 diabetes mellitus with proliferative diabetic retinopathy without macular edema: Secondary | ICD-10-CM | POA: Diagnosis not present

## 2014-05-11 DIAGNOSIS — H02834 Dermatochalasis of left upper eyelid: Secondary | ICD-10-CM | POA: Diagnosis not present

## 2014-05-11 DIAGNOSIS — Z794 Long term (current) use of insulin: Secondary | ICD-10-CM | POA: Diagnosis not present

## 2014-05-11 DIAGNOSIS — E785 Hyperlipidemia, unspecified: Secondary | ICD-10-CM | POA: Diagnosis not present

## 2014-05-11 DIAGNOSIS — Z940 Kidney transplant status: Secondary | ICD-10-CM | POA: Diagnosis not present

## 2014-05-11 DIAGNOSIS — E039 Hypothyroidism, unspecified: Secondary | ICD-10-CM | POA: Diagnosis not present

## 2014-05-11 DIAGNOSIS — R609 Edema, unspecified: Secondary | ICD-10-CM | POA: Diagnosis not present

## 2014-05-11 DIAGNOSIS — H02403 Unspecified ptosis of bilateral eyelids: Secondary | ICD-10-CM | POA: Diagnosis not present

## 2014-05-12 ENCOUNTER — Other Ambulatory Visit: Payer: Self-pay | Admitting: Endocrinology

## 2014-05-15 ENCOUNTER — Encounter: Payer: Self-pay | Admitting: *Deleted

## 2014-05-15 ENCOUNTER — Ambulatory Visit: Payer: Medicare Other | Admitting: Endocrinology

## 2014-05-15 ENCOUNTER — Telehealth: Payer: Self-pay | Admitting: Endocrinology

## 2014-05-15 DIAGNOSIS — Z940 Kidney transplant status: Secondary | ICD-10-CM | POA: Diagnosis not present

## 2014-05-15 NOTE — Telephone Encounter (Signed)
Patient no showed today's appt. Please advise on how to follow up. °A. No follow up necessary. °B. Follow up urgent. Contact patient immediately. °C. Follow up necessary. Contact patient and schedule visit in ___ days. °D. Follow up advised. Contact patient and schedule visit in ____weeks. ° °

## 2014-05-15 NOTE — Telephone Encounter (Signed)
Letter mailed

## 2014-05-17 DIAGNOSIS — Z4889 Encounter for other specified surgical aftercare: Secondary | ICD-10-CM | POA: Diagnosis not present

## 2014-05-17 DIAGNOSIS — Z9889 Other specified postprocedural states: Secondary | ICD-10-CM | POA: Diagnosis not present

## 2014-05-21 ENCOUNTER — Ambulatory Visit: Payer: Medicare Other | Admitting: Endocrinology

## 2014-05-23 ENCOUNTER — Other Ambulatory Visit: Payer: Self-pay | Admitting: Endocrinology

## 2014-05-23 NOTE — Telephone Encounter (Signed)
Patient is saying that he is now having to use up to 43 units with meals.  May we get a new rx with these directions, please advise.

## 2014-05-26 ENCOUNTER — Other Ambulatory Visit: Payer: Self-pay | Admitting: Endocrinology

## 2014-05-29 ENCOUNTER — Ambulatory Visit (INDEPENDENT_AMBULATORY_CARE_PROVIDER_SITE_OTHER): Payer: Medicare Other | Admitting: Podiatry

## 2014-05-29 ENCOUNTER — Encounter: Payer: Self-pay | Admitting: Podiatry

## 2014-05-29 VITALS — BP 180/88 | HR 64

## 2014-05-29 DIAGNOSIS — L97519 Non-pressure chronic ulcer of other part of right foot with unspecified severity: Secondary | ICD-10-CM | POA: Diagnosis not present

## 2014-05-29 DIAGNOSIS — M2041 Other hammer toe(s) (acquired), right foot: Secondary | ICD-10-CM

## 2014-05-29 NOTE — Progress Notes (Signed)
Subjective:  74 year old male presents for 4 week follow up on right 5th digit ulcer.   Objective:  Ulcered area 5th digit right has been re-opened with loss of dermal layer. Minimum drainage. No edema or erythema noted.  Positive of bilateral ankle edema.   Assessment:  Recurring ulcer 5th digit right. DM peripheral neuropathy.   Plan:  Added aperture pad on 5th digit right with instruction. Return in 2 weeks.

## 2014-05-29 NOTE — Patient Instructions (Signed)
Recurring ulcer right 5th digit debrided and padded. Return in 2 weeks.

## 2014-06-08 DIAGNOSIS — Z7901 Long term (current) use of anticoagulants: Secondary | ICD-10-CM | POA: Diagnosis not present

## 2014-06-08 DIAGNOSIS — G629 Polyneuropathy, unspecified: Secondary | ICD-10-CM | POA: Diagnosis not present

## 2014-06-08 DIAGNOSIS — E119 Type 2 diabetes mellitus without complications: Secondary | ICD-10-CM | POA: Diagnosis not present

## 2014-06-08 DIAGNOSIS — Z85820 Personal history of malignant melanoma of skin: Secondary | ICD-10-CM | POA: Diagnosis not present

## 2014-06-08 DIAGNOSIS — Z940 Kidney transplant status: Secondary | ICD-10-CM | POA: Diagnosis not present

## 2014-06-08 DIAGNOSIS — T8522XD Displacement of intraocular lens, subsequent encounter: Secondary | ICD-10-CM | POA: Diagnosis not present

## 2014-06-08 DIAGNOSIS — E785 Hyperlipidemia, unspecified: Secondary | ICD-10-CM | POA: Diagnosis not present

## 2014-06-08 DIAGNOSIS — H35371 Puckering of macula, right eye: Secondary | ICD-10-CM | POA: Diagnosis not present

## 2014-06-08 DIAGNOSIS — I4891 Unspecified atrial fibrillation: Secondary | ICD-10-CM | POA: Diagnosis not present

## 2014-06-08 DIAGNOSIS — I10 Essential (primary) hypertension: Secondary | ICD-10-CM | POA: Diagnosis not present

## 2014-06-08 DIAGNOSIS — M109 Gout, unspecified: Secondary | ICD-10-CM | POA: Diagnosis not present

## 2014-06-12 ENCOUNTER — Ambulatory Visit (INDEPENDENT_AMBULATORY_CARE_PROVIDER_SITE_OTHER): Payer: Medicare Other | Admitting: Podiatry

## 2014-06-12 ENCOUNTER — Encounter: Payer: Self-pay | Admitting: Podiatry

## 2014-06-12 DIAGNOSIS — E1342 Other specified diabetes mellitus with diabetic polyneuropathy: Secondary | ICD-10-CM

## 2014-06-12 DIAGNOSIS — G629 Polyneuropathy, unspecified: Secondary | ICD-10-CM | POA: Diagnosis not present

## 2014-06-12 DIAGNOSIS — N2581 Secondary hyperparathyroidism of renal origin: Secondary | ICD-10-CM | POA: Diagnosis not present

## 2014-06-12 DIAGNOSIS — L97519 Non-pressure chronic ulcer of other part of right foot with unspecified severity: Secondary | ICD-10-CM | POA: Diagnosis not present

## 2014-06-12 DIAGNOSIS — E1142 Type 2 diabetes mellitus with diabetic polyneuropathy: Secondary | ICD-10-CM

## 2014-06-12 DIAGNOSIS — Z940 Kidney transplant status: Secondary | ICD-10-CM | POA: Diagnosis not present

## 2014-06-12 DIAGNOSIS — M2041 Other hammer toe(s) (acquired), right foot: Secondary | ICD-10-CM | POA: Diagnosis not present

## 2014-06-12 DIAGNOSIS — E1129 Type 2 diabetes mellitus with other diabetic kidney complication: Secondary | ICD-10-CM | POA: Diagnosis not present

## 2014-06-12 LAB — HEMOGLOBIN A1C: Hgb A1c MFr Bld: 9.3 % — AB (ref 4.0–6.0)

## 2014-06-12 NOTE — Patient Instructions (Signed)
Ulcer right 5th toe. Doing well. Keep the pad for the next 12 days. Return in 2 weeks.

## 2014-06-12 NOTE — Progress Notes (Signed)
Subjective:  74 year old male presents for 2 week follow up on right 5th digit ulcer. He forgot to remove the pad and came in with old pad on the 5th toe right.   Objective:  Ulcered area 5th digit right with 0.5cm opening in center, limited to lermal layer. Minimum drainage. No edema or erythema noted.  Positive of bilateral ankle edema.   Assessment:  Recurring ulcer 5th digit right. DM peripheral neuropathy.   Plan:  Added aperture pad on 5th digit right with instruction. Return in 2 weeks.

## 2014-06-18 ENCOUNTER — Encounter: Payer: Self-pay | Admitting: *Deleted

## 2014-06-26 ENCOUNTER — Ambulatory Visit: Payer: Medicare Other | Admitting: Podiatry

## 2014-06-26 DIAGNOSIS — H02423 Myogenic ptosis of bilateral eyelids: Secondary | ICD-10-CM | POA: Diagnosis not present

## 2014-06-26 DIAGNOSIS — H0234 Blepharochalasis left upper eyelid: Secondary | ICD-10-CM | POA: Diagnosis not present

## 2014-06-26 DIAGNOSIS — H35372 Puckering of macula, left eye: Secondary | ICD-10-CM | POA: Diagnosis not present

## 2014-06-26 DIAGNOSIS — H0231 Blepharochalasis right upper eyelid: Secondary | ICD-10-CM | POA: Diagnosis not present

## 2014-06-26 DIAGNOSIS — H538 Other visual disturbances: Secondary | ICD-10-CM | POA: Diagnosis not present

## 2014-07-03 ENCOUNTER — Ambulatory Visit (INDEPENDENT_AMBULATORY_CARE_PROVIDER_SITE_OTHER): Payer: Medicare Other | Admitting: Podiatry

## 2014-07-03 ENCOUNTER — Encounter: Payer: Self-pay | Admitting: Podiatry

## 2014-07-03 DIAGNOSIS — L97519 Non-pressure chronic ulcer of other part of right foot with unspecified severity: Secondary | ICD-10-CM

## 2014-07-03 DIAGNOSIS — E1142 Type 2 diabetes mellitus with diabetic polyneuropathy: Secondary | ICD-10-CM

## 2014-07-03 DIAGNOSIS — E1342 Other specified diabetes mellitus with diabetic polyneuropathy: Secondary | ICD-10-CM

## 2014-07-03 DIAGNOSIS — G629 Polyneuropathy, unspecified: Secondary | ICD-10-CM

## 2014-07-03 NOTE — Progress Notes (Signed)
Subjective:  74 year old male presents for 2 week follow up on right 5th digit ulcer.   Objective:  Ulcered area 5th digit right has healed and covered with callus. No edema or erythema noted.  Positive of bilateral ankle edema.   Assessment:  Recurring ulcer 5th digit right has healed. DM peripheral neuropathy.   Plan:  Added aperture pad on 5th digit right with instruction. Return in 3 weeks.

## 2014-07-03 NOTE — Patient Instructions (Signed)
Doing well with ulcer. Return in 3 weeks.

## 2014-07-10 ENCOUNTER — Telehealth: Payer: Self-pay | Admitting: Endocrinology

## 2014-07-10 DIAGNOSIS — I480 Paroxysmal atrial fibrillation: Secondary | ICD-10-CM | POA: Diagnosis not present

## 2014-07-10 DIAGNOSIS — Z7901 Long term (current) use of anticoagulants: Secondary | ICD-10-CM | POA: Diagnosis not present

## 2014-07-10 NOTE — Telephone Encounter (Signed)
error 

## 2014-07-10 NOTE — Telephone Encounter (Signed)
Pt called

## 2014-07-12 ENCOUNTER — Other Ambulatory Visit (INDEPENDENT_AMBULATORY_CARE_PROVIDER_SITE_OTHER): Payer: Medicare Other

## 2014-07-12 DIAGNOSIS — E1165 Type 2 diabetes mellitus with hyperglycemia: Principal | ICD-10-CM

## 2014-07-12 DIAGNOSIS — E1149 Type 2 diabetes mellitus with other diabetic neurological complication: Secondary | ICD-10-CM | POA: Diagnosis not present

## 2014-07-12 DIAGNOSIS — IMO0002 Reserved for concepts with insufficient information to code with codable children: Secondary | ICD-10-CM

## 2014-07-12 LAB — GLUCOSE, RANDOM: Glucose, Bld: 206 mg/dL — ABNORMAL HIGH (ref 70–99)

## 2014-07-14 ENCOUNTER — Other Ambulatory Visit: Payer: Self-pay | Admitting: Endocrinology

## 2014-07-16 ENCOUNTER — Encounter: Payer: Self-pay | Admitting: Endocrinology

## 2014-07-16 ENCOUNTER — Other Ambulatory Visit: Payer: Self-pay | Admitting: Endocrinology

## 2014-07-16 ENCOUNTER — Ambulatory Visit (INDEPENDENT_AMBULATORY_CARE_PROVIDER_SITE_OTHER): Payer: Medicare Other | Admitting: Endocrinology

## 2014-07-16 ENCOUNTER — Other Ambulatory Visit: Payer: Self-pay

## 2014-07-16 VITALS — BP 128/68 | HR 77 | Temp 97.5°F | Ht 69.0 in | Wt 227.0 lb

## 2014-07-16 DIAGNOSIS — E1149 Type 2 diabetes mellitus with other diabetic neurological complication: Secondary | ICD-10-CM

## 2014-07-16 DIAGNOSIS — E1165 Type 2 diabetes mellitus with hyperglycemia: Principal | ICD-10-CM

## 2014-07-16 DIAGNOSIS — E063 Autoimmune thyroiditis: Secondary | ICD-10-CM

## 2014-07-16 DIAGNOSIS — IMO0002 Reserved for concepts with insufficient information to code with codable children: Secondary | ICD-10-CM

## 2014-07-16 DIAGNOSIS — N183 Chronic kidney disease, stage 3 unspecified: Secondary | ICD-10-CM

## 2014-07-16 DIAGNOSIS — E038 Other specified hypothyroidism: Secondary | ICD-10-CM | POA: Diagnosis not present

## 2014-07-16 MED ORDER — INSULIN GLARGINE 100 UNIT/ML SOLOSTAR PEN: PEN_INJECTOR | SUBCUTANEOUS | Status: DC

## 2014-07-16 NOTE — Progress Notes (Signed)
Pre visit review using our clinic review tool, if applicable. No additional management support is needed unless otherwise documented below in the visit note. 

## 2014-07-16 NOTE — Patient Instructions (Signed)
Lantus 25in am And 35 at night  Reduce dose in am if supper sugars low and adjust pm dose to keep am sugar in 90-130 range Must check sugar after lunch or supper

## 2014-07-16 NOTE — Progress Notes (Signed)
Patient ID: Mark [name], male   DOB: 11-Nov-1940, 74 y.o.   MRN: [id number]   Reason for Appointment: Diabetes follow-up   History of Present Illness   Diagnosis: Type 2 DIABETES MELITUS, long-standing and insulin-dependent       He has been on basal bolus insulin regimen for several years. Previously had been taking much larger doses of Lantus but is now requiring less since he had lost weight and had intercurrent illnesses He had done very well with Victoza in the past which had helped weight control and glucose but he had nausea and did not want to pay the high cost   Insulin regimen: Humalog 30 tid before meals. Humulin 25-27 a.m. -25 units at 9 PM  Recent history:  Because of his difficulty affording Lantus and Humalog insulin he was switched to NPH in 5/15 His A1c appears to have increased progressively and now over 9% even with the same insulin regimen He has increased his evening Humulin slightly but glucose is still not controlled Current blood sugar patterns:  His fasting blood sugars have extremely variable and he does not know whybeen consistently high until about 03/12/14 with readings as high as 289.  Subsequently his fasting blood sugars have improved significantly with readings as low as 75 in the last week or so  He is checking blood sugars sporadically in the afternoon, probably mostly before eating and these are mildly increased except on 12/12 and he had a glucose of 67  Blood sugar before supper time have been variable and high only once; lowest reading 66  Again despite reminders he is not checking sugars after supper and has only one high reading of 309 late at night  Overall blood sugar is averaging 170 for the last month  Hypoglycemia:  Has had 3 readings in the 60s at 2 PM or 8 PM recently  He has marked variability in his morning sugars although recently they have been somewhat better; he is not sure this is related to his meal the night before He has  some very consistently high readings after eating cereal in the morning but he still does not take any extra insulin when he does this Mealtime insulin: he is still taking the same amount of insulin even recently even though pre-meal blood sugars appear to be better.  Usually compliant with taking insulin when eating out but does not adjust the dose if he is eating a higher calorie are higher fat meals His A1c has been consistently high since 7/14 and at the highest level now  Oral hypoglycemic drugs: none     Proper timing of medications in relation to meals: Yes, he does inject Humalog right before meals.          Monitors blood glucose:  1.3 times a day.    Glucometer: Verio       Blood Glucose readings: Glucometer download reviewed:   PRE-MEAL Breakfast Lunch Dinner Bedtime Overall  Glucose range: 90-304 159, 241     median: 175    177        Meals: 3 meals per day. for breakfast he May have frozen meat croissant; cereal 1-2/7 days;  sometimes outside meals may be high fat; eating out once a day but trying to keep portions small and also not usually having large amounts of carbohydrates now, no snacks; having dinner 8 pm    Physical activity: exercise: very limited walking           Wt  Readings from Last 3 Encounters:  07/16/14 227 lb (102.967 kg)  03/20/14 219 lb 6.4 oz (99.519 kg)  12/25/13 226 lb (102.513 kg)     Lab Results  Component Value Date   HGBA1C 9.3* 06/12/2014   HGBA1C 8.6* 03/16/2014   HGBA1C 7.7* 12/18/2013   Lab Results  Component Value Date   MICROALBUR 24.5* 03/16/2014   LDLCALC 88 09/27/2013   CREATININE 3.5* 03/16/2014        Medication List       This list is accurate as of: 07/16/14  4:04 PM.  Always use your most recent med list.               allopurinol 300 MG tablet  Commonly known as:  ZYLOPRIM  TAKE 1/2 TABLET BY MOUTH ONCE DAILY.     BD PEN NEEDLE NANO U/F 32G X 4 MM Misc  Generic drug:  Insulin Pen Needle  USE AS DIRECTED 5  TIMES DAILY SUBQUE.     calcitRIOL 0.5 MCG capsule  Commonly known as:  ROCALTROL  Take one by mouth daily     DIGOX 0.125 MG tablet  Generic drug:  digoxin  Take one by mouth daily     fenofibrate micronized 134 MG capsule  Commonly known as:  LOFIBRA  TAKE ONE CAPSULE EVERY DAY WITH A MEAL     furosemide 80 MG tablet  Commonly known as:  LASIX  Take 2 tabs by mouth every am and 1 every pm     gabapentin 300 MG capsule  Commonly known as:  NEURONTIN  Take 1 capsule (300 mg total) by mouth 3 (three) times daily.     HYDROcodone-acetaminophen 5-325 MG per tablet  Commonly known as:  NORCO/VICODIN  Take 1 by mouth every 4-6 hours as needed     insulin lispro 100 UNIT/ML KiwkPen  Commonly known as:  HUMALOG KWIKPEN  INJECT under skin up to 43 UNITS DAILY WITH MEALS.     insulin lispro 100 unit/ml Soln  Commonly known as:  HUMALOG  Inject into the skin. Use 26 units with meals subq     Insulin NPH (Human) (Isophane) 100 UNIT/ML Kiwkpen  Commonly known as:  HUMULIN N KWIKPEN  25 minutes in the morning and up to 28 units at bedtime as directed     INSULIN SYRINGE .3CC/31GX5/16" 31G X 5/16" 0.3 ML Misc  Use as directed to inject insulin     ketoconazole 2 % shampoo  Commonly known as:  NIZORAL     levothyroxine 88 MCG tablet  Commonly known as:  SYNTHROID, LEVOTHROID  TAKE ONE TABLET BY MOUTH IN THE MORNING ON AN EMPTY STOMACH     metolazone 2.5 MG tablet  Commonly known as:  ZAROXOLYN     metoprolol succinate 50 MG 24 hr tablet  Commonly known as:  TOPROL-XL  Take one by mouth daily     mupirocin ointment 2 %  Commonly known as:  BACTROBAN     nystatin 100000 UNIT/GM Powd     omeprazole 20 MG capsule  Commonly known as:  PRILOSEC  Take 20 mg by mouth daily.     ONETOUCH DELICA LANCETS FINE Misc  Use to check blood sugar 4 times per day dx code 250.02     ONETOUCH VERIO test strip  Generic drug:  glucose blood  USE AS INSTRUCTED TO CHECK BLOOD SUGAR 4  TIMES A DAY     oxyCODONE-acetaminophen 7.5-325 MG per tablet  Commonly known as:  PERCOCET  Take 1 tablet by mouth.     PHOSPHA 250 NEUTRAL 155-852-130 MG tablet  Generic drug:  phosphorus     potassium chloride SA 20 MEQ tablet  Commonly known as:  K-DUR,KLOR-CON  Take 2 by mouth two times a day     predniSONE 5 MG tablet  Commonly known as:  DELTASONE  Take 5 mg by mouth daily.     PROGRAF 0.5 MG capsule  Generic drug:  tacrolimus  Take 0.5 mg by mouth 2 (two) times daily.     RAPAMUNE 0.5 MG Tabs  Generic drug:  Sirolimus  Take by mouth daily.     rosuvastatin 20 MG tablet  Commonly known as:  CRESTOR  Take 1 tablet daily     SSD 1 % cream  Generic drug:  silver sulfADIAZINE  Apply to affected area twice a day     warfarin 5 MG tablet  Commonly known as:  COUMADIN  Take one by mouth daily        Allergies: No Known Allergies  Past Medical History  Diagnosis Date  . Diabetes   . Hypertension   . Gout   . Hypothyroidism   . Ulcer of other part of foot 12/15/2013    Past Surgical History  Procedure Laterality Date  . Melanoma excision    . Ankle fracture surgery    . Cataract extraction      No family history on file.  Social History:  reports that he has never smoked. He has never used smokeless tobacco. He reports that he does not drink alcohol or use illicit drugs.  Review of Systems   Cardiovascular ROS: positive for history of chronic  Atrial fibrillation  Edema is recently not controlled with large doses of Lasix (200mg  bid) and Zaroxlyn He does not like Elastic stockings as they are difficult to put on  RENAL dysfunction: This is being followed closely by his nephrologist  Lab Results  Component Value Date   CREATININE 3.5* 03/16/2014     Mild hypothyroidism, stable on 88 mcg levothyroxine; TSH last normal; compliant with supplement; no unusual fatigue or weight change  Lab Results  Component Value Date   TSH 1.17 03/16/2014    HYPERLIPIDEMIA: Even though he is taking full doses of fenofibrate along with Crestor his triglycerides are high.  Previously had intolerance to niacin  His diet can be still relatively high in fat if eating out  Lab Results  Component Value Date   CHOL 172 09/27/2013   HDL 26.70* 09/27/2013   LDLCALC 88 09/27/2013   LDLDIRECT 84.7 05/04/2013   TRIG 285.0* 09/27/2013   CHOLHDL 6 09/27/2013   Numb feet, some stinging sensation present and better with gabapentin as needed started in 9/15   Examination:   BP 128/68 mmHg  Pulse 77  Temp(Src) 97.5 F (36.4 C) (Oral)  Ht 5\' 9"  (1.753 m)  Wt 227 lb (102.967 kg)  BMI 33.51 kg/m2  SpO2 97%  Body mass index is 33.51 kg/(m^2).     Assesment/ PLAN:  DIABETES: His blood sugars are overall still high and A1c has increased progressively See history of present illness for detailed discussion of his current blood sugar patterns, insulin regimen and problems identified He has very inconsistent blood sugar readings; however his appearing to be requiring progressively higher doses of insulin He does not think his fasting readings are high because of overnight hyperglycemia from his evening meal but not clear why blood sugars range from 90-300;  may be related to variability of the NPH He is agreeable to the following options:  Change NPH to Lantus insulin.  He will probably need larger doses to start with especially in the evening  Continue taking Humalog with every meal but may need to adjust this based on meal size  He will take more Humalog when he is eating cereal in the morning  Check C-peptide level to see if he qualifies for an insulin pump.  Discussed basics of insulin pumps and how it would benefit him.  Emphasized the need for starting to check his sugars during the day after meals especially after supper  HYPERTENSION: fairly well controlled now     Patient Instructions  Lantus 25in am And 35 at night  Reduce dose in am  if supper sugars low and adjust pm dose to keep am sugar in 90-130 range Must check sugar after lunch or supper   Counseling time over 50% of today's 25 minute visit     Mark [name] 07/16/2014, 4:04 PM

## 2014-07-17 ENCOUNTER — Telehealth: Payer: Self-pay | Admitting: Endocrinology

## 2014-07-17 NOTE — Telephone Encounter (Signed)
lvm for pt to call back.  

## 2014-07-17 NOTE — Telephone Encounter (Signed)
Please call pt back he needs clarification on his blood sugars

## 2014-07-17 NOTE — Telephone Encounter (Signed)
<  180 after meals and <140 in am

## 2014-07-17 NOTE — Telephone Encounter (Signed)
Pt called back. Pt would like to know whether his BS should be over or under 200. Coming in for lab and also mentioned he was trying to qualify for a pump.  Please advise.

## 2014-07-18 LAB — FRUCTOSAMINE: Fructosamine: 331 umol/L — ABNORMAL HIGH (ref 190–270)

## 2014-07-18 NOTE — Telephone Encounter (Signed)
Pt informed of MD instructions.

## 2014-07-19 ENCOUNTER — Other Ambulatory Visit (INDEPENDENT_AMBULATORY_CARE_PROVIDER_SITE_OTHER): Payer: Medicare Other

## 2014-07-19 DIAGNOSIS — E1149 Type 2 diabetes mellitus with other diabetic neurological complication: Secondary | ICD-10-CM

## 2014-07-19 DIAGNOSIS — IMO0002 Reserved for concepts with insufficient information to code with codable children: Secondary | ICD-10-CM

## 2014-07-19 DIAGNOSIS — E1165 Type 2 diabetes mellitus with hyperglycemia: Principal | ICD-10-CM

## 2014-07-19 LAB — GLUCOSE, RANDOM: Glucose, Bld: 166 mg/dL — ABNORMAL HIGH (ref 70–99)

## 2014-07-20 LAB — C-PEPTIDE: C-Peptide: 2.9 ng/mL (ref 1.1–4.4)

## 2014-07-24 ENCOUNTER — Encounter: Payer: Self-pay | Admitting: Podiatry

## 2014-07-24 ENCOUNTER — Ambulatory Visit (INDEPENDENT_AMBULATORY_CARE_PROVIDER_SITE_OTHER): Payer: Medicare Other | Admitting: Podiatry

## 2014-07-24 DIAGNOSIS — M2041 Other hammer toe(s) (acquired), right foot: Secondary | ICD-10-CM | POA: Diagnosis not present

## 2014-07-24 DIAGNOSIS — L97519 Non-pressure chronic ulcer of other part of right foot with unspecified severity: Secondary | ICD-10-CM | POA: Diagnosis not present

## 2014-07-24 NOTE — Patient Instructions (Signed)
Doing well on right 5th toe lesion. Keep the pad for 3 weeks. Return in 4 weeks.

## 2014-07-24 NOTE — Progress Notes (Signed)
Subjective:  74 year old male presents for 3 week follow up on right 5th digit ulcer.  He kept the pad till yesterday.   Objective:  Ulcered area 5th digit right show normal skin without opening or abnormal skin damage.  No edema or erythema noted.  Positive of bilateral ankle edema.   Assessment:  Recurring ulcer 5th digit right has healed. DM peripheral neuropathy.   Plan:  Added aperture pad on 5th digit right with instruction. Return in 4 weeks.

## 2014-07-25 ENCOUNTER — Encounter: Payer: Self-pay | Admitting: Endocrinology

## 2014-07-30 ENCOUNTER — Telehealth: Payer: Self-pay | Admitting: Nutrition

## 2014-07-31 ENCOUNTER — Encounter: Payer: Medicare Other | Attending: Endocrinology | Admitting: Nutrition

## 2014-07-31 DIAGNOSIS — G629 Polyneuropathy, unspecified: Secondary | ICD-10-CM | POA: Diagnosis not present

## 2014-07-31 DIAGNOSIS — Z794 Long term (current) use of insulin: Secondary | ICD-10-CM | POA: Diagnosis not present

## 2014-07-31 DIAGNOSIS — Z713 Dietary counseling and surveillance: Secondary | ICD-10-CM | POA: Diagnosis not present

## 2014-07-31 DIAGNOSIS — E1142 Type 2 diabetes mellitus with diabetic polyneuropathy: Secondary | ICD-10-CM

## 2014-07-31 DIAGNOSIS — E1342 Other specified diabetes mellitus with diabetic polyneuropathy: Secondary | ICD-10-CM | POA: Insufficient documentation

## 2014-08-01 ENCOUNTER — Other Ambulatory Visit: Payer: Self-pay | Admitting: Endocrinology

## 2014-08-01 DIAGNOSIS — IMO0002 Reserved for concepts with insufficient information to code with codable children: Secondary | ICD-10-CM

## 2014-08-01 DIAGNOSIS — E1165 Type 2 diabetes mellitus with hyperglycemia: Secondary | ICD-10-CM

## 2014-08-01 NOTE — Progress Notes (Signed)
Pt. Wanted to see the insulin pumps that he can choose from.  We discussed the advantages and disadvantages of insulin pump therapy, and how they work.   He was shown all of the insulin pumps, and we discussed the advantages and disadvantages of each of the pumps.  He chose the [place] pump, and he signed the paperwork and it was faxed to [place].

## 2014-08-03 ENCOUNTER — Other Ambulatory Visit: Payer: Self-pay | Admitting: Endocrinology

## 2014-08-07 NOTE — Telephone Encounter (Signed)
Opened in error

## 2014-08-09 DIAGNOSIS — I4891 Unspecified atrial fibrillation: Secondary | ICD-10-CM | POA: Diagnosis not present

## 2014-08-09 DIAGNOSIS — Z7901 Long term (current) use of anticoagulants: Secondary | ICD-10-CM | POA: Diagnosis not present

## 2014-08-14 DIAGNOSIS — N185 Chronic kidney disease, stage 5: Secondary | ICD-10-CM | POA: Diagnosis not present

## 2014-08-14 DIAGNOSIS — Z940 Kidney transplant status: Secondary | ICD-10-CM | POA: Diagnosis not present

## 2014-08-21 ENCOUNTER — Telehealth: Payer: Self-pay | Admitting: Nutrition

## 2014-08-21 ENCOUNTER — Ambulatory Visit (INDEPENDENT_AMBULATORY_CARE_PROVIDER_SITE_OTHER): Payer: Medicare Other | Admitting: Podiatry

## 2014-08-21 ENCOUNTER — Encounter: Payer: Self-pay | Admitting: Podiatry

## 2014-08-21 VITALS — BP 128/80 | HR 78 | Ht 69.0 in | Wt 223.0 lb

## 2014-08-21 DIAGNOSIS — M79606 Pain in leg, unspecified: Secondary | ICD-10-CM

## 2014-08-21 DIAGNOSIS — B351 Tinea unguium: Secondary | ICD-10-CM

## 2014-08-21 NOTE — Telephone Encounter (Signed)
Patient call me saying he has not gotten his insulin pump.  I called him back saying that it was faxed to [place] twice now, (once yesterday), and the [facility] rep. Was notified of this as well.  He was told to call me when he gets it in the mail to make an appt. For training

## 2014-08-21 NOTE — Patient Instructions (Signed)
Seen for follow up on 5th toe right. Doing well. Keep the pad for 3 weeks. Return in 4 weeks.

## 2014-08-21 NOTE — Progress Notes (Signed)
Subjective:  74 year old male presents for 4 week follow up on right 5th digit ulcer.  He kept the pad till yesterday. Patient requests toe nails trimmed.   Objective:  Ulcered area 5th digit right show normal skin without opening or abnormal skin damage.  No edema or erythema noted.  Positive of bilateral ankle edema.  Hypertrophic nails x 10.  Assessment:  Recurring ulcer 5th digit right has healed. DM peripheral neuropathy.  Onychomycosis x 10.  Plan:  Added aperture pad on 5th digit right with instruction. Debrided all nails.  Return in 4 weeks.

## 2014-08-24 ENCOUNTER — Telehealth: Payer: Self-pay | Admitting: Endocrinology

## 2014-08-24 ENCOUNTER — Other Ambulatory Visit: Payer: Self-pay

## 2014-08-24 DIAGNOSIS — Z7901 Long term (current) use of anticoagulants: Secondary | ICD-10-CM | POA: Diagnosis not present

## 2014-08-24 DIAGNOSIS — I4892 Unspecified atrial flutter: Secondary | ICD-10-CM | POA: Diagnosis not present

## 2014-08-24 MED ORDER — INSULIN LISPRO 100 UNIT/ML (KWIKPEN): 30.0000 [IU] | PEN_INJECTOR | Freq: Three times a day (TID) | SUBCUTANEOUS | Status: DC

## 2014-08-24 MED ORDER — INSULIN LISPRO 200 UNIT/ML ~~LOC~~ SOPN: 15.0000 [IU] | PEN_INJECTOR | Freq: Three times a day (TID) | SUBCUTANEOUS | Status: DC

## 2014-08-24 NOTE — Telephone Encounter (Signed)
Patient called stating he has been having an issue with getting his pump and per Leonia Reader she is to be giving the patient of his medication   Humalog Humulin   Please advise patient   Thank you

## 2014-08-24 NOTE — Telephone Encounter (Signed)
Pt is here in the office gave him 2 sample that should last about 12 days.

## 2014-08-27 ENCOUNTER — Encounter: Payer: Self-pay | Admitting: Endocrinology

## 2014-08-27 ENCOUNTER — Ambulatory Visit (INDEPENDENT_AMBULATORY_CARE_PROVIDER_SITE_OTHER): Payer: Medicare Other | Admitting: Endocrinology

## 2014-08-27 VITALS — BP 138/88 | HR 72 | Temp 97.5°F | Ht 69.0 in | Wt 231.5 lb

## 2014-08-27 DIAGNOSIS — IMO0002 Reserved for concepts with insufficient information to code with codable children: Secondary | ICD-10-CM

## 2014-08-27 DIAGNOSIS — E1165 Type 2 diabetes mellitus with hyperglycemia: Secondary | ICD-10-CM | POA: Diagnosis not present

## 2014-08-27 DIAGNOSIS — E039 Hypothyroidism, unspecified: Secondary | ICD-10-CM | POA: Diagnosis not present

## 2014-08-27 DIAGNOSIS — R609 Edema, unspecified: Secondary | ICD-10-CM | POA: Diagnosis not present

## 2014-08-27 DIAGNOSIS — E1129 Type 2 diabetes mellitus with other diabetic kidney complication: Secondary | ICD-10-CM | POA: Diagnosis not present

## 2014-08-27 DIAGNOSIS — R600 Localized edema: Secondary | ICD-10-CM

## 2014-08-27 MED ORDER — INSULIN GLARGINE 300 UNIT/ML ~~LOC~~ SOPN: 60.0000 [IU] | PEN_INJECTOR | Freq: Every day | SUBCUTANEOUS | Status: DC

## 2014-08-27 NOTE — Progress Notes (Signed)
Pre visit review using our clinic review tool, if applicable. No additional management support is needed unless otherwise documented below in the visit note. 

## 2014-08-27 NOTE — Patient Instructions (Addendum)
Toujeo 60 units in pm till pump available and adjust every 3-4 days till am sugar < 130  Fenofibrate 3x per week

## 2014-08-27 NOTE — Progress Notes (Signed)
Patient ID: Mark [name], male   DOB: [birth year], 74 y.o.   MRN: [id number]   Reason for Appointment: Diabetes follow-up   History of Present Illness   Diagnosis: Type 2 DIABETES MELITUS, long-standing and insulin-dependent       He has been on basal bolus insulin regimen for several years. Previously had been taking much larger doses of Lantus but is now requiring less since he had lost weight and had intercurrent illnesses He had done very well with Victoza in the past which had helped weight control and glucose but he had nausea and did not want to pay the high cost   Insulin regimen: Humalog 30 tid before meals.  Also previously was on Humulin 25-27 a.m. -25 units at 9 PM  Recent history:  Because of his difficulty affording Lantus and Humalog insulin he was switched to NPH in 5/15   However when he ran out of this he did not get the prescription refilled in anticipation of starting an insulin pump it is only taking Humalog currently  He was able to get Lantus after his last visit and he thinks his blood sugars are better.  Also he tried a sample of Toujeo from his PCP and he thinks morning sugars were better with this also His A1c appears to have increased progressively and over 9%  Current blood sugar patterns and problems identified:  His fasting blood sugars have extremely variable  Especially with his not taking consistent Basal insulin recently  Blood sugars without his nasal insulin in the morning frequently over 300 in the afternoons and evenings although relatively better last month when he was taking Lantus  No hypoglycemia recently  Very few blood sugars in the afternoons and none after supper  Mealtime insulin: he is still taking the same amount of insulin   Usually compliant with taking insulin when eating out  Again does not adjust the dose if he is eating a higher calorie are higher fat meals  His A1c has been consistently high since 7/14 and at the highest  level in 3/16  Oral hypoglycemic drugs: none     Proper timing of medications in relation to meals: Yes, he does inject Humalog right before meals.          Monitors blood glucose:  1.3 times a day.    Glucometer: Verio       Blood Glucose readings: Glucometer download reviewed:    PRE-MEAL Breakfast Lunch Dinner Bedtime Overall  Glucose range:  102-368  69-328  142-430    Mean/median: 208     223   Meals: 3 meals per day. for breakfast he May have frozen meat croissant; cereal 1-2/7 days;  sometimes outside meals may be high fat; eating out once a day  and trying to keep portions small .  Frequently has no snacks; having dinner 8 pm    Physical activity: exercise: very limited walking           Wt Readings from Last 3 Encounters:  08/27/14 231 lb 8 oz (105.008 kg)  08/21/14 223 lb (101.152 kg)  07/16/14 227 lb (102.967 kg)     Lab Results  Component Value Date   HGBA1C 9.3* 06/12/2014   HGBA1C 8.6* 03/16/2014   HGBA1C 7.7* 12/18/2013   Lab Results  Component Value Date   MICROALBUR 24.5* 03/16/2014   LDLCALC 88 09/27/2013   CREATININE 3.5* 03/16/2014        Medication List  This list is accurate as of: 08/27/14 11:59 PM.  Always use your most recent med list.               allopurinol 300 MG tablet  Commonly known as:  ZYLOPRIM  TAKE 1/2 OF A TABLET BY MOUTH ONCE DAILY.     BD PEN NEEDLE NANO U/F 32G X 4 MM Misc  Generic drug:  Insulin Pen Needle  USE AS DIRECTED 5 TIMES DAILY SUBQUE.     calcitRIOL 0.5 MCG capsule  Commonly known as:  ROCALTROL  Take one by mouth daily     DIGOX 0.125 MG tablet  Generic drug:  digoxin  Take one by mouth daily     fenofibrate micronized 134 MG capsule  Commonly known as:  LOFIBRA  TAKE ONE CAPSULE EVERY DAY WITH A MEAL     furosemide 80 MG tablet  Commonly known as:  LASIX  Take 2 tabs by mouth every am and 1 every pm     HYDROcodone-acetaminophen 5-325 MG per tablet  Commonly known as:  NORCO/VICODIN   Take 1 by mouth every 4-6 hours as needed     Insulin Glargine 300 UNIT/ML Sopn  Commonly known as:  TOUJEO SOLOSTAR  Inject 60 Units into the skin at bedtime.     insulin lispro 100 UNIT/ML KiwkPen  Commonly known as:  HUMALOG KWIKPEN  INJECT under skin up to 43 UNITS DAILY WITH MEALS.     insulin lispro 100 UNIT/ML KiwkPen  Commonly known as:  HUMALOG  Inject 0.3 mLs (30 Units total) into the skin 3 (three) times daily.     Insulin Lispro (Human) 200 UNIT/ML Sopn  Commonly known as:  HUMALOG KWIKPEN  Inject 15 Units into the skin 3 (three) times daily.     INSULIN SYRINGE .3CC/31GX5/16" 31G X 5/16" 0.3 ML Misc  Use as directed to inject insulin     ketoconazole 2 % shampoo  Commonly known as:  NIZORAL     levothyroxine 88 MCG tablet  Commonly known as:  SYNTHROID, LEVOTHROID  TAKE ONE TABLET BY MOUTH IN THE MORNING ON AN EMPTY STOMACH     metolazone 2.5 MG tablet  Commonly known as:  ZAROXOLYN     metoprolol succinate 50 MG 24 hr tablet  Commonly known as:  TOPROL-XL  Take one by mouth daily     mupirocin ointment 2 %  Commonly known as:  BACTROBAN     nystatin 100000 UNIT/GM Powd     omeprazole 20 MG capsule  Commonly known as:  PRILOSEC  Take 20 mg by mouth daily.     ONETOUCH DELICA LANCETS FINE Misc  Use to check blood sugar 4 times per day dx code 250.02     ONETOUCH VERIO test strip  Generic drug:  glucose blood  USE AS INSTRUCTED TO CHECK BLOOD SUGAR 4 TIMES A DAY     oxyCODONE-acetaminophen 7.5-325 MG per tablet  Commonly known as:  PERCOCET  Take 1 tablet by mouth.     PHOSPHA 250 NEUTRAL 155-852-130 MG tablet  Generic drug:  phosphorus     potassium chloride SA 20 MEQ tablet  Commonly known as:  K-DUR,KLOR-CON  Take 2 by mouth two times a day     predniSONE 5 MG tablet  Commonly known as:  DELTASONE  Take 5 mg by mouth daily.     PROGRAF 0.5 MG capsule  Generic drug:  tacrolimus  Take 0.5 mg by mouth 2 (two) times daily.  RAPAMUNE  0.5 MG Tabs  Generic drug:  Sirolimus  Take by mouth daily.     rosuvastatin 20 MG tablet  Commonly known as:  CRESTOR  Take 1 tablet daily     SSD 1 % cream  Generic drug:  silver sulfADIAZINE  Apply to affected area twice a day     warfarin 5 MG tablet  Commonly known as:  COUMADIN  Take one by mouth daily        Allergies: No Known Allergies  Past Medical History  Diagnosis Date  . Diabetes   . Hypertension   . Gout   . Hypothyroidism   . Ulcer of other part of foot 12/15/2013    Past Surgical History  Procedure Laterality Date  . Melanoma excision    . Ankle fracture surgery    . Cataract extraction      No family history on file.  Social History:  reports that he has never smoked. He has never used smokeless tobacco. He reports that he does not drink alcohol or use illicit drugs.  Review of Systems   Cardiovascular ROS: positive for history of chronic  Atrial fibrillation  Edema is recently not controlled with large doses of Lasix (200mg  bid) and Zaroxlyn prn He does not like Elastic stockings as they are difficult to put on  RENAL dysfunction: This is being followed closely by his nephrologist Last creatinine apparently 2.1  Lab Results  Component Value Date   CREATININE 3.5* 03/16/2014    Mild hypothyroidism, stable on 88 mcg levothyroxine; TSH last normal; compliant with supplement; no unusual fatigue or weight change  Lab Results  Component Value Date   TSH 1.17 03/16/2014   HYPERLIPIDEMIA: Even with taking full doses of fenofibrate along with Crestor his triglycerides are high but needs follow-up levels.  Previously had intolerance to niacin  His diet can be still relatively high in fat if eating out  Lab Results  Component Value Date   CHOL 172 09/27/2013   HDL 26.70* 09/27/2013   LDLCALC 88 09/27/2013   LDLDIRECT 84.7 05/04/2013   TRIG 285.0* 09/27/2013   CHOLHDL 6 09/27/2013    NEUROPATHY: He has some numbness in feet, some  stinging sensation and paresthesia previously were improved with taking gabapentin 300 mg.  He would have symptoms during the day also Apparently he was getting off balance and gabapentin was stopped recently Does not think his symptoms are back at this time   Examination:   BP 138/88 mmHg  Pulse 72  Temp(Src) 97.5 F (36.4 C) (Oral)  Ht 5\' 9"  (1.753 m)  Wt 231 lb 8 oz (105.008 kg)  BMI 34.17 kg/m2  SpO2 98%  Body mass index is 34.17 kg/(m^2).      Assesment/ PLAN:  DIABETES: His blood sugars are overall still poorly controlled and he requires relatively large doses of insulin See history of present illness for detailed discussion of his current blood sugar patterns, insulin regimen and problems identified He is having difficulty affording brand name insulin dose and appears to be doing much better with using Lantus or Toujeo compared to NPH twice a day which he had been using He has very inconsistent blood sugar readings in the mornings when NPH compared to Lantus Also he is consistently not checking his blood sugars after meals as directed to help adjust mealtime doses Currently he is not able to qualify for the insulin pump because of relatively high C-peptide level but he is still interested in doing  this  He is going to do the following:  Change NPH to Toujeo insulin for now and given him the free water to get this  He can continue to do this at the same doses as before but for convenience we'll try him on one injection of 60 units at night and adjust the dose based on fasting readings  Continue taking Humalog with every meal but  need to adjust this based on meal size  More readings 2 hours after meals to help adjust her mealtime dose  He will take more Humalog when he is eating cereal in the morning  Check C-peptide level again to see if he qualifies for an insulin pump.    Regular walking for exercise  HYPERTENSION: This is followed by nephrologist and cardiologist,  relatively high today  CKD: His creatinine is reportedly better than 6 months ago and followed by nephrologist, report not available Since his creatinine clearance is still probably below 45 will have him reduce his fenofibrate to 3 times a week  NEUROPATHY: He thinks his symptoms are not as bad currently with stopping gabapentin 300 mg which was apparently causing neurological side effects Given him the option of trying the 100 mg dose if needed in the future    Patient Instructions  Toujeo 60 units in pm till pump available and adjust every 3-4 days till am sugar < 130  Fenofibrate 3x per week   Counseling time over 50% of today's 25 minute visit     Shaman Muscarella 08/28/2014, 10:03 AM

## 2014-08-28 DIAGNOSIS — I10 Essential (primary) hypertension: Secondary | ICD-10-CM | POA: Diagnosis not present

## 2014-08-28 DIAGNOSIS — E780 Pure hypercholesterolemia: Secondary | ICD-10-CM | POA: Diagnosis not present

## 2014-08-28 DIAGNOSIS — N183 Chronic kidney disease, stage 3 (moderate): Secondary | ICD-10-CM | POA: Diagnosis not present

## 2014-08-28 DIAGNOSIS — E1142 Type 2 diabetes mellitus with diabetic polyneuropathy: Secondary | ICD-10-CM | POA: Diagnosis not present

## 2014-08-28 DIAGNOSIS — E034 Atrophy of thyroid (acquired): Secondary | ICD-10-CM | POA: Diagnosis not present

## 2014-08-28 DIAGNOSIS — I4891 Unspecified atrial fibrillation: Secondary | ICD-10-CM | POA: Diagnosis not present

## 2014-08-28 DIAGNOSIS — E1129 Type 2 diabetes mellitus with other diabetic kidney complication: Secondary | ICD-10-CM | POA: Diagnosis not present

## 2014-08-29 LAB — GLUCOSE, RANDOM: GLUCOSE: 103 mg/dL — AB (ref 65–99)

## 2014-08-29 LAB — C-PEPTIDE: C-Peptide: 3.4 ng/mL (ref 1.1–4.4)

## 2014-08-29 NOTE — Progress Notes (Signed)
Quick Note:  Please let patient know that the C-peptide test is above normal and Mark [name] need to talk to [employer] to see if he will still qualify for a pump ______

## 2014-09-06 DIAGNOSIS — I4892 Unspecified atrial flutter: Secondary | ICD-10-CM | POA: Diagnosis not present

## 2014-09-06 DIAGNOSIS — T8522XA Displacement of intraocular lens, initial encounter: Secondary | ICD-10-CM | POA: Diagnosis not present

## 2014-09-06 DIAGNOSIS — Z961 Presence of intraocular lens: Secondary | ICD-10-CM | POA: Diagnosis not present

## 2014-09-06 DIAGNOSIS — E11351 Type 2 diabetes mellitus with proliferative diabetic retinopathy with macular edema: Secondary | ICD-10-CM | POA: Diagnosis not present

## 2014-09-18 ENCOUNTER — Encounter: Payer: Self-pay | Admitting: Podiatry

## 2014-09-18 ENCOUNTER — Ambulatory Visit (INDEPENDENT_AMBULATORY_CARE_PROVIDER_SITE_OTHER): Payer: Medicare Other | Admitting: Podiatry

## 2014-09-18 VITALS — BP 183/90 | HR 73

## 2014-09-18 DIAGNOSIS — E1165 Type 2 diabetes mellitus with hyperglycemia: Secondary | ICD-10-CM

## 2014-09-18 DIAGNOSIS — L97519 Non-pressure chronic ulcer of other part of right foot with unspecified severity: Secondary | ICD-10-CM | POA: Diagnosis not present

## 2014-09-18 DIAGNOSIS — M2041 Other hammer toe(s) (acquired), right foot: Secondary | ICD-10-CM

## 2014-09-18 DIAGNOSIS — IMO0002 Reserved for concepts with insufficient information to code with codable children: Secondary | ICD-10-CM

## 2014-09-18 NOTE — Progress Notes (Signed)
Subjective:  74 year old male presents for 4 week follow up on right 5th digit ulcer.  He was able to keep the pad till 10 days ago. He feels the corn but not very painful yet.    Objective:  Ulcered area 5th digit right show normal skin without opening or abnormal skin damage.  Increased bilateral foot and ankle edema.   Assessment:  Recurring ulcer 5th digit right has healed. DM peripheral neuropathy.  Onychomycosis x 10.  Plan:  Added aperture pad on 5th digit right with instruction. Debrided all nails.  Return in 4 weeks.

## 2014-09-20 DIAGNOSIS — Z7901 Long term (current) use of anticoagulants: Secondary | ICD-10-CM | POA: Diagnosis not present

## 2014-09-20 DIAGNOSIS — I4891 Unspecified atrial fibrillation: Secondary | ICD-10-CM | POA: Diagnosis not present

## 2014-10-02 ENCOUNTER — Telehealth: Payer: Self-pay | Admitting: Endocrinology

## 2014-10-02 NOTE — Telephone Encounter (Signed)
Please call pt about his pump

## 2014-10-03 ENCOUNTER — Telehealth: Payer: Self-pay | Admitting: Nutrition

## 2014-10-03 NOTE — Telephone Encounter (Signed)
Pt. Called.  See next phone message

## 2014-10-03 NOTE — Telephone Encounter (Signed)
Linda,  Please see below 

## 2014-10-03 NOTE — Telephone Encounter (Signed)
Mark [name] left me a message on my machine requesting another C-peptide and FBS.   I phoned him backing saying that  We can do one, but that his Medicare may not pay for this, and that the cost would be about $100.00  He agreed to pay for this.  He would like to come on Tues. Morning next week the 5th.

## 2014-10-09 ENCOUNTER — Other Ambulatory Visit (INDEPENDENT_AMBULATORY_CARE_PROVIDER_SITE_OTHER): Payer: Medicare Other

## 2014-10-09 ENCOUNTER — Other Ambulatory Visit: Payer: Self-pay | Admitting: *Deleted

## 2014-10-09 DIAGNOSIS — IMO0002 Reserved for concepts with insufficient information to code with codable children: Secondary | ICD-10-CM

## 2014-10-09 DIAGNOSIS — E1165 Type 2 diabetes mellitus with hyperglycemia: Secondary | ICD-10-CM | POA: Diagnosis not present

## 2014-10-09 LAB — GLUCOSE, RANDOM: Glucose, Bld: 179 mg/dL — ABNORMAL HIGH (ref 70–99)

## 2014-10-10 LAB — C-PEPTIDE: C PEPTIDE: 2.7 ng/mL (ref 1.1–4.4)

## 2014-10-15 DIAGNOSIS — Z7901 Long term (current) use of anticoagulants: Secondary | ICD-10-CM | POA: Diagnosis not present

## 2014-10-15 DIAGNOSIS — I4891 Unspecified atrial fibrillation: Secondary | ICD-10-CM | POA: Diagnosis not present

## 2014-10-17 ENCOUNTER — Telehealth: Payer: Self-pay

## 2014-10-17 ENCOUNTER — Other Ambulatory Visit: Payer: Self-pay | Admitting: *Deleted

## 2014-10-17 ENCOUNTER — Ambulatory Visit (INDEPENDENT_AMBULATORY_CARE_PROVIDER_SITE_OTHER): Payer: Medicare Other | Admitting: Endocrinology

## 2014-10-17 ENCOUNTER — Encounter: Payer: Self-pay | Admitting: Endocrinology

## 2014-10-17 VITALS — BP 148/72 | HR 69 | Temp 98.1°F | Resp 16 | Ht 69.0 in | Wt 224.8 lb

## 2014-10-17 DIAGNOSIS — I10 Essential (primary) hypertension: Secondary | ICD-10-CM

## 2014-10-17 DIAGNOSIS — IMO0002 Reserved for concepts with insufficient information to code with codable children: Secondary | ICD-10-CM

## 2014-10-17 DIAGNOSIS — E1129 Type 2 diabetes mellitus with other diabetic kidney complication: Secondary | ICD-10-CM

## 2014-10-17 DIAGNOSIS — E1165 Type 2 diabetes mellitus with hyperglycemia: Secondary | ICD-10-CM

## 2014-10-17 DIAGNOSIS — E039 Hypothyroidism, unspecified: Secondary | ICD-10-CM

## 2014-10-17 LAB — COMPREHENSIVE METABOLIC PANEL
ALBUMIN: 3.1 g/dL — AB (ref 3.5–5.2)
ALT: 15 U/L (ref 0–53)
AST: 21 U/L (ref 0–37)
Alkaline Phosphatase: 40 U/L (ref 39–117)
BILIRUBIN TOTAL: 1.2 mg/dL (ref 0.2–1.2)
BUN: 26 mg/dL — ABNORMAL HIGH (ref 6–23)
CALCIUM: 9.3 mg/dL (ref 8.4–10.5)
CO2: 34 mEq/L — ABNORMAL HIGH (ref 19–32)
CREATININE: 1.76 mg/dL — AB (ref 0.40–1.50)
Chloride: 97 mEq/L (ref 96–112)
GFR: 40.38 mL/min — ABNORMAL LOW (ref >60.00)
Glucose, Bld: 236 mg/dL — ABNORMAL HIGH (ref 70–99)
Potassium: 3.4 mEq/L — ABNORMAL LOW (ref 3.5–5.1)
SODIUM: 138 meq/L (ref 135–145)
Total Protein: 6.6 g/dL (ref 6.0–8.3)

## 2014-10-17 LAB — TSH: TSH: 1.88 u[IU]/mL (ref 0.35–4.50)

## 2014-10-17 LAB — LIPID PANEL
Cholesterol: 195 mg/dL (ref 0–200)
HDL: 27.7 mg/dL — ABNORMAL LOW (ref >39.00)
NonHDL: 167.3
Total CHOL/HDL Ratio: 7
Triglycerides: 313 mg/dL — ABNORMAL HIGH (ref 0.0–149.0)
VLDL: 62.6 mg/dL — AB (ref 0.0–40.0)

## 2014-10-17 LAB — T4, FREE: FREE T4: 1.31 ng/dL (ref 0.60–1.60)

## 2014-10-17 LAB — LDL CHOLESTEROL, DIRECT: Direct LDL: 96 mg/dL

## 2014-10-17 MED ORDER — INSULIN REGULAR HUMAN (CONC) 500 UNIT/ML ~~LOC~~ SOPN: 20.0000 [IU] | PEN_INJECTOR | Freq: Three times a day (TID) | SUBCUTANEOUS | Status: DC

## 2014-10-17 NOTE — Telephone Encounter (Signed)
What is the other entity with [facility]

## 2014-10-17 NOTE — Telephone Encounter (Signed)
There is not one that's open for pts.

## 2014-10-17 NOTE — Telephone Encounter (Signed)
This call was made to him in error

## 2014-10-17 NOTE — Patient Instructions (Addendum)
U-500 insulin: 15 at breakfast, 20 at lunch and 18 at dinner Take 30 min before meals  If sugar still >150 in am on weekly basis go up 2 units on supper dose  May add 10-15 Humalog if sugar >300  Check sugar 4x daily and call readings in 10 days

## 2014-10-17 NOTE — Progress Notes (Signed)
Patient ID: Mark [name], male   DOB: 07-23-1940, 74 y.o.   MRN: [id number]   Reason for Appointment: Diabetes follow-up   History of Present Illness   Diagnosis: Type 2 DIABETES MELITUS, long-standing and insulin-dependent       He has been on basal bolus insulin regimen for several years. Previously had been taking much larger doses of Lantus but is now requiring less since he had lost weight and had intercurrent illnesses He had done very well with Victoza in the past which had helped weight control and glucose but he had nausea and did not want to pay the high cost   Insulin regimen: Toujeo 60 units at bedtime. Humalog 30 tid before meals.     Recent history:  Because of his difficulty affording Lantus and Humalog insulin he was switched to NPH in 5/15 He was able to get Toujeo more recently and is taking this once a day at night Although his blood sugars were initially better with going back on Lantus or Toujeo his blood sugars appear to be progressively higher now His A1c appears to have increased progressively and over 9% on the last measurement His A1c has been consistently high since 7/14 and at the highest level in 3/16  Current blood sugar patterns and problems identified:  His fasting blood sugars have been somewhat variable but mostly high and he cannot explain why  He has not increased his Toujeo even though he was taking as much as 80 units of Lantus previously  Nonfasting blood sugars appear to be generally progressively higher towards the evening although does have significant variability  He does not increase his Humalog based on his meal size or take extra for high readings  He has some readings in the evenings around 8-9 PM, probably before supper and these are the highest of the day and averaging nearly 300  Again checking blood sugar somewhat erratically and mostly in the morning recently  He is not able to exercise  Has lost some weight possibly from  high readings  Complaining about the cost of his insulin although he prefers to take Humalog when he is eating out with the pen  Oral hypoglycemic drugs: none     Proper timing of medications in relation to meals: Yes, he does inject Humalog right before meals.          Monitors blood glucose:  1-2 times a day.    Glucometer: Verio       Blood Glucose readings: Glucometer download reviewed:   Mean values apply above for all meters except median for One Touch  PRE-MEAL Fasting Lunch  3-7 PM   8-9 PM  Overall  Glucose range:  68-366   133-283   211-447   84-469    Mean/median:  200     307  211    Meals: 3 meals per day. for breakfast he May have frozen meat croissant; cereal 1-2/7 days;  sometimes outside meals may be high fat; eating out once a day  and trying to keep portions small .  Frequently has no snacks; having dinner 8 pm    Physical activity: exercise: very limited walking           Wt Readings from Last 3 Encounters:  10/17/14 224 lb 12.8 oz (101.969 kg)  08/27/14 231 lb 8 oz (105.008 kg)  08/21/14 223 lb (101.152 kg)     Lab Results  Component Value Date   HGBA1C 9.3* 06/12/2014  HGBA1C 8.6* 03/16/2014   HGBA1C 7.7* 12/18/2013   Lab Results  Component Value Date   MICROALBUR 24.5* 03/16/2014   LDLCALC 88 09/27/2013   CREATININE 3.5* 03/16/2014        Medication List       This list is accurate as of: 10/17/14 12:49 PM.  Always use your most recent med list.               allopurinol 300 MG tablet  Commonly known as:  ZYLOPRIM  TAKE 1/2 OF A TABLET BY MOUTH ONCE DAILY.     BD PEN NEEDLE NANO U/F 32G X 4 MM Misc  Generic drug:  Insulin Pen Needle  USE AS DIRECTED 5 TIMES DAILY SUBQUE.     calcitRIOL 0.5 MCG capsule  Commonly known as:  ROCALTROL  Take one by mouth daily     DIGOX 0.125 MG tablet  Generic drug:  digoxin  Take one by mouth daily     fenofibrate micronized 134 MG capsule  Commonly known as:  LOFIBRA  TAKE ONE CAPSULE EVERY  DAY WITH A MEAL     furosemide 80 MG tablet  Commonly known as:  LASIX  Take 2 tabs by mouth every am and 1 every pm     HYDROcodone-acetaminophen 5-325 MG per tablet  Commonly known as:  NORCO/VICODIN  Take 1 by mouth every 4-6 hours as needed     Insulin Glargine 300 UNIT/ML Sopn  Commonly known as:  TOUJEO SOLOSTAR  Inject 60 Units into the skin at bedtime.     insulin lispro 100 UNIT/ML KiwkPen  Commonly known as:  HUMALOG KWIKPEN  INJECT under skin up to 43 UNITS DAILY WITH MEALS.     insulin lispro 100 UNIT/ML KiwkPen  Commonly known as:  HUMALOG  Inject 0.3 mLs (30 Units total) into the skin 3 (three) times daily.     Insulin Lispro (Human) 200 UNIT/ML Sopn  Commonly known as:  HUMALOG KWIKPEN  Inject 15 Units into the skin 3 (three) times daily.     Insulin Regular Human (Conc) 500 UNIT/ML Sopn  Commonly known as:  HUMULIN R U-500 KWIKPEN  Inject 20 Units into the skin 3 (three) times daily. Inject up to 20 units three times a day     INSULIN SYRINGE .3CC/31GX5/16" 31G X 5/16" 0.3 ML Misc  Use as directed to inject insulin     ketoconazole 2 % shampoo  Commonly known as:  NIZORAL     levothyroxine 88 MCG tablet  Commonly known as:  SYNTHROID, LEVOTHROID  TAKE ONE TABLET BY MOUTH IN THE MORNING ON AN EMPTY STOMACH     metolazone 2.5 MG tablet  Commonly known as:  ZAROXOLYN     metoprolol succinate 50 MG 24 hr tablet  Commonly known as:  TOPROL-XL  Take one by mouth daily     mupirocin ointment 2 %  Commonly known as:  BACTROBAN     nystatin 100000 UNIT/GM Powd     omeprazole 20 MG capsule  Commonly known as:  PRILOSEC  Take 20 mg by mouth daily.     ONETOUCH DELICA LANCETS FINE Misc  Use to check blood sugar 4 times per day dx code 250.02     ONETOUCH VERIO test strip  Generic drug:  glucose blood  USE AS INSTRUCTED TO CHECK BLOOD SUGAR 4 TIMES A DAY     oxyCODONE-acetaminophen 7.5-325 MG per tablet  Commonly known as:  PERCOCET  Take 1 tablet  by mouth.     PHOSPHA 250 NEUTRAL 155-852-130 MG tablet  Generic drug:  phosphorus     potassium chloride SA 20 MEQ tablet  Commonly known as:  K-DUR,KLOR-CON  Take 2 by mouth two times a day     predniSONE 5 MG tablet  Commonly known as:  DELTASONE  Take 5 mg by mouth daily.     PROGRAF 0.5 MG capsule  Generic drug:  tacrolimus  Take 0.5 mg by mouth 2 (two) times daily.     RAPAMUNE 0.5 MG Tabs  Generic drug:  Sirolimus  Take by mouth daily.     rosuvastatin 20 MG tablet  Commonly known as:  CRESTOR  Take 1 tablet daily     SSD 1 % cream  Generic drug:  silver sulfADIAZINE  Apply to affected area twice a day     warfarin 5 MG tablet  Commonly known as:  COUMADIN  Take one by mouth daily        Allergies: No Known Allergies  Past Medical History  Diagnosis Date  . Diabetes   . Hypertension   . Gout   . Hypothyroidism   . Ulcer of other part of foot 12/15/2013    Past Surgical History  Procedure Laterality Date  . Melanoma excision    . Ankle fracture surgery    . Cataract extraction      No family history on file.  Social History:  reports that he has never smoked. He has never used smokeless tobacco. He reports that he does not drink alcohol or use illicit drugs.  Review of Systems   Cardiovascular ROS: positive for history of chronic  Atrial fibrillation  Edema is recently not controlled with large doses of Lasix (200mg  bid) and Zaroxlyn prn He does not like Elastic stockings as they are difficult to put on  RENAL dysfunction: This is being followed  by his nephrologist Last creatinine apparently below 3.0  Lab Results  Component Value Date   CREATININE 3.5* 03/16/2014    Mild hypothyroidism, stable on 88 mcg levothyroxine; TSH last normal; compliant with supplement; no unusual fatigue or weight change  Lab Results  Component Value Date   TSH 1.17 03/16/2014   HYPERLIPIDEMIA: Even with taking full doses of fenofibrate along with Crestor  his triglycerides are high but needs follow-up levels.  Previously had intolerance to niacin  His diet is periodically  high in fat if eating out  Lab Results  Component Value Date   CHOL 172 09/27/2013   HDL 26.70* 09/27/2013   LDLCALC 88 09/27/2013   LDLDIRECT 84.7 05/04/2013   TRIG 285.0* 09/27/2013   CHOLHDL 6 09/27/2013    NEUROPATHY: He has some numbness in feet, some stinging sensation and paresthesia previously were improved with taking gabapentin 300 mg.  He would have symptoms during the day also Apparently is getting off balance       Examination:   BP 164/82 mmHg  Pulse 69  Temp(Src) 98.1 F (36.7 C)  Resp 16  Ht 5\' 9"  (1.753 m)  Wt 224 lb 12.8 oz (101.969 kg)  BMI 33.18 kg/m2  SpO2 97%  Body mass index is 33.18 kg/(m^2).      Assesment/ PLAN:  DIABETES: His blood sugars are overall still poorly controlled and he requires relatively large doses of insulin, both basal and bolus See history of present illness for detailed discussion of his current blood sugar patterns, insulin regimen and problems identified Despite going back to Toujeo and  Humalog and taking about 150 units of insulin a day his blood sugars are averaging over 200 Also unclear if Toujeo is lasting 24 hours since blood sugars are higher later in the day with his bedtime doses  Also he is consistently not checking his blood sugars after meals as directed to help adjust mealtime doses Again he is not able to qualify for the insulin pump because of relatively high C-peptide level done 3 times  Because of his difficulty with control and inability to get insulin pump he is a good candidate for the U-500 regular insulin Discussed in detail how this works and onset and duration of action, timing of injection, concentrated nature of the insulin and dose equivalence to the Humalog or Novolog insulins He was given the co-pay card to get a free box of insulin  He will start with a total of about 50 units a  day since he is not controlled with current dose of 150 units of traditional insulin He will take the highest dose at mid day since blood sugars are the highest recently around his late evening supper time Discussed needing to adjust the evening dose based on fasting readings and also cutting back on the evening dose if getting low normal or low sugars overnight Encouraged him to be more consistent with diet and exercise also  HYPERTENSION: This is followed by nephrologist and cardiologist, again relatively high today  BALANCE problems: Advised him to discuss this with PCP and consider neurology consultation    Patient Instructions  U-500 insulin: 15 at breakfast, 20 at lunch and 18 at dinner Take 30 min before meals  If sugar still >150 in am on weekly basis go up 2 units on supper dose  May add 10-15 Humalog if sugar >300  Check sugar 4x daily and call readings in 10 days     Counseling time on subjects discussed above is over 50% of today's 25 minute visit    Qadir Folks 10/17/2014, 12:49 PM

## 2014-10-17 NOTE — Telephone Encounter (Signed)
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 Please advise

## 2014-10-18 LAB — GLUTAMIC ACID DECARBOXYLASE AUTO ABS: Glutamic Acid Decarb Ab: <5 U/mL (ref 0.0–5.0)

## 2014-10-19 NOTE — Progress Notes (Signed)
Quick Note:  Please let patient know that the potassium is slightly low, needs to increase by 1 tablet daily. Creatinine 1.76, send to nephrologist  ______

## 2014-10-30 ENCOUNTER — Ambulatory Visit (INDEPENDENT_AMBULATORY_CARE_PROVIDER_SITE_OTHER): Payer: Medicare Other | Admitting: Podiatry

## 2014-10-30 ENCOUNTER — Encounter: Payer: Self-pay | Admitting: Podiatry

## 2014-10-30 VITALS — Ht 69.0 in | Wt 224.0 lb

## 2014-10-30 DIAGNOSIS — M79606 Pain in leg, unspecified: Secondary | ICD-10-CM

## 2014-10-30 DIAGNOSIS — B351 Tinea unguium: Secondary | ICD-10-CM | POA: Diagnosis not present

## 2014-10-30 NOTE — Patient Instructions (Signed)
Seen for hypertrophic corns. All nails and corns debrided. Return in 6 weeks or as needed.

## 2014-10-30 NOTE — Progress Notes (Signed)
Subjective:  74 year old male presents for 6 weeks follow up on right 5th digit ulcer.  He was able to keep the pad for 2 weeks. The toe started to hurt a couple of days ago.   Objective:  Pre ulcerative corn 5th digit right with a history of slow wound healing.  Hypertrophic nails x 10.   Assessment:  Recurring ulcer 5th digit right has healed. DM peripheral neuropathy.  Onychomycosis x 10.  Plan:  Added aperture pad on 5th digit right with instruction. Debrided all nails.  Return in 6 weeks.

## 2014-11-06 ENCOUNTER — Telehealth: Payer: Self-pay | Admitting: *Deleted

## 2014-11-06 NOTE — Telephone Encounter (Signed)
Increase to 55 at Bfst and lunch and 45 at supper

## 2014-11-06 NOTE — Telephone Encounter (Signed)
Patient called, he said since he's been on the Humulin R U-500 his sugars have been running high, today in the am it was 171, after lunch it was up to 441, he said they have been as high as 600. I asked him to check the pen needle to see if he's getting any medication out of the needle, he was not able to get any out, so I had him switch needles and it worked this time. Advised him to check in a couple of hours and call me back with the reading.

## 2014-11-06 NOTE — Telephone Encounter (Signed)
Noted, patient is aware. 

## 2014-11-06 NOTE — Telephone Encounter (Signed)
Patient just called back his sugar came down from 441 to 408. He did have a cup of apple sauce. He started taking the humulin last weekend.

## 2014-11-06 NOTE — Telephone Encounter (Signed)
Mark [name] is already taking 40 units TID and his sugars have been running that high.

## 2014-11-06 NOTE — Telephone Encounter (Signed)
Needs to increase doses to 30 at breakfast and lunch and keep 20 at dinner

## 2014-11-06 NOTE — Telephone Encounter (Signed)
Sugars high just today? Does he remove pen needle each time he injects?

## 2014-11-06 NOTE — Telephone Encounter (Signed)
He said not just today, for the last several days since he started on the Humulin. He said he does change his needle after every injection.

## 2014-11-13 DIAGNOSIS — I4891 Unspecified atrial fibrillation: Secondary | ICD-10-CM | POA: Diagnosis not present

## 2014-11-14 DIAGNOSIS — Z940 Kidney transplant status: Secondary | ICD-10-CM | POA: Diagnosis not present

## 2014-11-14 DIAGNOSIS — E1129 Type 2 diabetes mellitus with other diabetic kidney complication: Secondary | ICD-10-CM | POA: Diagnosis not present

## 2014-11-14 DIAGNOSIS — N2581 Secondary hyperparathyroidism of renal origin: Secondary | ICD-10-CM | POA: Diagnosis not present

## 2014-11-14 DIAGNOSIS — D899 Disorder involving the immune mechanism, unspecified: Secondary | ICD-10-CM | POA: Diagnosis not present

## 2014-11-21 DIAGNOSIS — I120 Hypertensive chronic kidney disease with stage 5 chronic kidney disease or end stage renal disease: Secondary | ICD-10-CM | POA: Diagnosis not present

## 2014-11-21 DIAGNOSIS — Z940 Kidney transplant status: Secondary | ICD-10-CM | POA: Diagnosis not present

## 2014-11-21 DIAGNOSIS — N185 Chronic kidney disease, stage 5: Secondary | ICD-10-CM | POA: Diagnosis not present

## 2014-11-21 DIAGNOSIS — N186 End stage renal disease: Secondary | ICD-10-CM | POA: Diagnosis not present

## 2014-11-22 ENCOUNTER — Other Ambulatory Visit: Payer: Self-pay

## 2014-11-22 ENCOUNTER — Ambulatory Visit: Payer: Medicare Other | Admitting: Endocrinology

## 2014-11-22 NOTE — Patient Outreach (Signed)
[facility]) Care Management  11/22/2014  ERIK BURKETT 1941-02-13 [id number]  Telephonic Care Management Note  Referral Date:  11/22/2014   Referral Issue:  DM Referral Source:  Dr. Elayne Snare, Endocrinologist Admissions: 0 ED Visits: 0 PCP:  Dr. Dirk Dress      Social: Patient lives in his home with his wife.  Mobility: States poor mobility due to poor vision, balance problems relating to neuropathy and medication side effects (dizziness) Caregiver: wife or daughter  Falls: yes, several over the last few months.  Transportation: self or daughter DME:  [facility] Conditions:  DM Pain: Bilateral feet relating to diabetic neuropathy  DM Patient verbalized much frustration with general healthcare system and "[place]."   Patient C/O current healthcare system is a result of bad politics with the [employer].   Patient states he needs an insulin pump but Medicare will not cover and has denied coverage about two months ago.  Patient is unable to confirm if an appeal was filed.  Patient states he knows his doctor did everything he could to help get it approved.  Patient unable to confirm if he has copy of the denial.  Patient states based on his understanding of the process that his BS was too high for an insulin pump and requires his BS to be below 200.  Patient states he has an appt with Dr. Dwyane Dee tomorrow 11/23/2014 and will discuss again with his MD. Weight 212 Height 5'8" RN CM identifies HIGH RISK:  H/O Renal Transplant RN CM will follow-up on insulin pump coverage option.  RN CM will contact Dr. Jodelle Green office for details regarding last attempt for approval.  RN CM will provide patient DM education and how to self advocate and manage his healthcare needs.   Medications: Taking more than 10 medications a day. Taking as prescribed and not missing any dosages.   Consent: Patient consents to [facility] services.  RN CM advised to please notify MD of any changes  in condition prior to scheduled appt's.   RN CM provided contact name and #, 24-hour nurse line # [phone].   RN CM confirmed patient is aware of 911 services for urgent emergency needs. RN CM advised in next follow-call within 10 days from [place] CM.  Instructed patient that RN CM will need more time on next call to complete assessment to better assist patient with his needs.    Plan:  RN CM notified [facility]: agreed to services - case opened.   RN CM will send patient successful outreach letter with [place] and consent for services.  RN CM will complete Initial assessment on next outreach call.  RN CM will provide care coordination services to assist patient with understanding any insurance coverage options for an insulin pump.  RN CM will provide patient with education to self manage his healthcare needs.  [name], RN, BSN, [facility], CCM  [employer] Management Coordinator [phone] Direct [phone] Cell [phone] Office [phone] Fax

## 2014-11-22 NOTE — Patient Outreach (Signed)
[name] Care Management  11/22/2014  Mark [name] 1940-05-05 [id number]   Care Coordination Note:  RN CM reviewed Dr. Ronnie Derby office note dated 10/17/14 regarding Insulin Pump.  RN CM will provide supportive education to patient to reinforce Dr. Ronnie Derby findings that patient is ineligible for insulin pump "because of relatively high C-peptide level done 3 times."  RN CM also noted per Dr. Ronnie Derby note:  Patient non-adherent to checking blood sugars after meals as directed to help adjust mealtime doses.    Per EPIC MR:  Last A1C: 9.3 on 06/12/14  RN CM identifies patient would benefit from [place] services but patient would only agreed to Telephonic RN CM services as of 11/22/14.   RN CM will continue to assess and make referral as patient will allow.   [name], RN, BSN, [facility], CCM  [employer] Management Coordinator [phone] Direct [phone] Cell [phone] Office [phone] Fax

## 2014-11-23 ENCOUNTER — Encounter: Payer: Self-pay | Admitting: Endocrinology

## 2014-11-23 ENCOUNTER — Other Ambulatory Visit: Payer: Self-pay | Admitting: *Deleted

## 2014-11-23 ENCOUNTER — Ambulatory Visit (INDEPENDENT_AMBULATORY_CARE_PROVIDER_SITE_OTHER): Payer: Medicare Other | Admitting: Endocrinology

## 2014-11-23 VITALS — BP 122/74 | HR 74 | Temp 98.0°F | Resp 16 | Ht 69.0 in | Wt 224.2 lb

## 2014-11-23 DIAGNOSIS — E1165 Type 2 diabetes mellitus with hyperglycemia: Secondary | ICD-10-CM

## 2014-11-23 DIAGNOSIS — IMO0002 Reserved for concepts with insufficient information to code with codable children: Secondary | ICD-10-CM

## 2014-11-23 MED ORDER — INSULIN REGULAR HUMAN (CONC) 500 UNIT/ML ~~LOC~~ SOPN: PEN_INJECTOR | SUBCUTANEOUS | Status: DC

## 2014-11-23 NOTE — Patient Instructions (Signed)
Check blood sugars on waking up .. 4 .. times a week Also check blood sugars about 2 hours after a meal and do this after different meals by rotation  Recommended blood sugar levels on waking up is 90-130 and about 2 hours after meal is 140-180 Please bring blood sugar monitor to each visit.  Adjust insulin based on meal size and Carbs

## 2014-11-23 NOTE — Progress Notes (Signed)
Patient ID: Mark [name], male   DOB: 04-03-1941, 74 y.o.   MRN: [id number]   Reason for Appointment: Diabetes follow-up   History of Present Illness   Diagnosis: Type 2 DIABETES MELITUS, long-standing and insulin-dependent      Previous history: He has been on basal bolus insulin regimen for several years. Previously had been taking much larger doses of Lantus but is now requiring less since he had lost weight and had intercurrent illnesses He had done very well with Victoza in the past which had helped weight control and glucose but he had nausea and did not want to pay the high cost Because of his difficulty affording Lantus and Humalog insulin he was switched to NPH and Humalog in 5/15   Insulin regimen: Humulin R U-500: 55 at Bfst and lunch and 45 at supper     Recent history:  His A1c appears to have increased progressively and over 9% on the last measurement His A1c has been consistently high since 7/14 and at the highest level in 3/16 Because of the high cost of using Toujeo and Humalog and inability to get an insulin pump because of his high C-peptide level he was switched to U-500 insulin in 10/2014 The dose of this was titrated up when his sugars are higher with the initial recommended dose  Current blood sugar patterns and problems identified:  His fasting blood sugars have been quite variable and except for this morning high in the last 5 days over 200  He has stopped checking his blood sugars at lunch and dinner in the last few days and has no readings after supper despite reminders to check blood sugars at various times  Not clear why some mornings his blood sugars are significantly high, he has been compliant with his insulin 30 minutes before meals  Still eating out frequently and has variable carbohydrate fat intake  He is not able to exercise  Oral hypoglycemic drugs: none     Proper timing of medications in relation to meals: Yes, he does inject  Humalog right before meals.          Monitors blood glucose:  1-2 times a day.    Glucometer: Verio       Blood Glucose readings: Glucometer download reviewed:   Mean values apply above for all meters except median for One Touch  PRE-MEAL Fasting Lunch Dinner Bedtime Overall  Glucose range: 99-289  92-219   75-266   75-312  Mean/median: 200   160   208    Meals: 3 meals per day. for breakfast he May have frozen meat croissant; cereal 1-2/7 days;  sometimes outside meals may be high fat; eating out once a day  and trying to keep portions small .  Frequently has no snacks; having dinner 8 pm    Physical activity: exercise: very limited walking           Wt Readings from Last 3 Encounters:  11/23/14 224 lb 3.2 oz (101.696 kg)  11/22/14 212 lb (96.163 kg)  10/30/14 224 lb (101.606 kg)    A1c on 11/22/14 = 9.4  Lab Results  Component Value Date   HGBA1C 9.3* 06/12/2014   HGBA1C 8.6* 03/16/2014   HGBA1C 7.7* 12/18/2013   Lab Results  Component Value Date   MICROALBUR 24.5* 03/16/2014   LDLCALC 88 09/27/2013   CREATININE 1.76* 10/17/2014        Medication List       This list is  accurate as of: 11/23/14 11:59 PM.  Always use your most recent med list.               allopurinol 300 MG tablet  Commonly known as:  ZYLOPRIM  TAKE 1/2 OF A TABLET BY MOUTH ONCE DAILY.     BD PEN NEEDLE NANO U/F 32G X 4 MM Misc  Generic drug:  Insulin Pen Needle  USE AS DIRECTED 5 TIMES DAILY SUBQUE.     calcitRIOL 0.5 MCG capsule  Commonly known as:  ROCALTROL  Take one by mouth daily     DIGOX 0.125 MG tablet  Generic drug:  digoxin  Take one by mouth daily     fenofibrate micronized 134 MG capsule  Commonly known as:  LOFIBRA  TAKE ONE CAPSULE EVERY DAY WITH A MEAL     furosemide 80 MG tablet  Commonly known as:  LASIX  Take 2 tabs by mouth every am and 1 every pm     HYDROcodone-acetaminophen 5-325 MG per tablet  Commonly known as:  NORCO/VICODIN  Take 1 by mouth every  4-6 hours as needed     Insulin Glargine 300 UNIT/ML Sopn  Commonly known as:  TOUJEO SOLOSTAR  Inject 60 Units into the skin at bedtime.     insulin lispro 100 UNIT/ML KiwkPen  Commonly known as:  HUMALOG KWIKPEN  INJECT under skin up to 43 UNITS DAILY WITH MEALS.     insulin lispro 100 UNIT/ML KiwkPen  Commonly known as:  HUMALOG  Inject 0.3 mLs (30 Units total) into the skin 3 (three) times daily.     Insulin Lispro (Human) 200 UNIT/ML Sopn  Commonly known as:  HUMALOG KWIKPEN  Inject 15 Units into the skin 3 (three) times daily.     insulin regular human CONCENTRATED 500 UNIT/ML injection  Commonly known as:  HUMULIN R U-500 KWIKPEN  Inject 60 units three times a day     INSULIN SYRINGE .3CC/31GX5/16" 31G X 5/16" 0.3 ML Misc  Use as directed to inject insulin     ketoconazole 2 % shampoo  Commonly known as:  NIZORAL     levothyroxine 88 MCG tablet  Commonly known as:  SYNTHROID, LEVOTHROID  TAKE ONE TABLET BY MOUTH IN THE MORNING ON AN EMPTY STOMACH     metolazone 2.5 MG tablet  Commonly known as:  ZAROXOLYN     metoprolol succinate 50 MG 24 hr tablet  Commonly known as:  TOPROL-XL  Take one by mouth daily     mupirocin ointment 2 %  Commonly known as:  BACTROBAN     nystatin 100000 UNIT/GM Powd     omeprazole 20 MG capsule  Commonly known as:  PRILOSEC  Take 20 mg by mouth daily.     ONETOUCH DELICA LANCETS FINE Misc  Use to check blood sugar 4 times per day dx code 250.02     ONETOUCH VERIO test strip  Generic drug:  glucose blood  USE AS INSTRUCTED TO CHECK BLOOD SUGAR 4 TIMES A DAY     oxyCODONE-acetaminophen 7.5-325 MG per tablet  Commonly known as:  PERCOCET  Take 1 tablet by mouth.     PHOSPHA 250 NEUTRAL 155-852-130 MG tablet  Generic drug:  phosphorus     potassium chloride SA 20 MEQ tablet  Commonly known as:  K-DUR,KLOR-CON  Take 20 mEq by mouth 2 (two) times daily. Takes 2 tablets in am and 3 in pm     predniSONE 5 MG tablet  Commonly  known as:  DELTASONE  Take 5 mg by mouth daily.     PROGRAF 0.5 MG capsule  Generic drug:  tacrolimus  Take 0.5 mg by mouth 2 (two) times daily.     RAPAMUNE 0.5 MG Tabs  Generic drug:  Sirolimus  Take by mouth daily.     rosuvastatin 20 MG tablet  Commonly known as:  CRESTOR  Take 1 tablet daily     SSD 1 % cream  Generic drug:  silver sulfADIAZINE  Apply to affected area twice a day     warfarin 5 MG tablet  Commonly known as:  COUMADIN  Take one by mouth daily        Allergies: No Known Allergies  Past Medical History  Diagnosis Date  . Diabetes   . Hypertension   . Gout   . Hypothyroidism   . Ulcer of other part of foot 12/15/2013    Past Surgical History  Procedure Laterality Date  . Melanoma excision    . Ankle fracture surgery    . Cataract extraction      No family history on file.  Social History:  reports that he has never smoked. He has never used smokeless tobacco. He reports that he does not drink alcohol or use illicit drugs.  Review of Systems   Cardiovascular ROS: positive for history of chronic atrial fibrillation  Edema is chronic, treated with with doses of Lasix (200mg  bid) and Zaroxlyn prn He does not like Elastic stockings as they are difficult to put on  RENAL dysfunction: This is being followed  by his nephrologist Recent levels are better  Lab Results  Component Value Date   CREATININE 1.76* 10/17/2014    Mild hypothyroidism, stable on 88 mcg levothyroxine; TSH last normal; compliant with supplement; no unusual fatigue or weight change  Lab Results  Component Value Date   TSH 1.88 10/17/2014   HYPERLIPIDEMIA: Even with taking full doses of fenofibrate along with Crestor his triglycerides are high fairly consistently  Previously had intolerance to niacin  His diet is periodically  high in fat if eating out  Lab Results  Component Value Date   CHOL 195 10/17/2014   HDL 27.70* 10/17/2014   LDLCALC 88 09/27/2013    LDLDIRECT 96.0 10/17/2014   TRIG 313.0* 10/17/2014   CHOLHDL 7 10/17/2014    NEUROPATHY: He has some numbness in feet, some stinging sensation and paresthesia previously were improved with taking gabapentin 300 mg.     Examination:   BP 122/74 mmHg  Pulse 74  Temp(Src) 98 F (36.7 C)  Resp 16  Ht 5\' 9"  (1.753 m)  Wt 224 lb 3.2 oz (101.696 kg)  BMI 33.09 kg/m2  SpO2 96%  Body mass index is 33.09 kg/(m^2).      Assesment/ PLAN:  DIABETES: His blood sugars are improving somewhat with switching to U-500 insulin in the KwikPen See history of present illness for detailed discussion of his current blood sugar patterns, insulin regimen and problems identified However he has been significantly noncompliant and check his blood sugars at lunch, supper or bedtime As discussed above somewhat high morning readings may be related to variability in his diet the night before Explained to the patient that unless he checks readings 2-3 hours after evening meals we cannot adjust his evening insulin  Currently his A1c is 9.4, about the same as before and may improve further with further adjustment of his insulin  Most likely will need to adjust the mealtime doses  daily based on his meal content in size especially at suppertime. Will not change his regimen as yet Again although he had previously benefit from from [place] he probably cannot afford this now Encouraged him to be more consistent with diet and exercise also  HYPERTENSION: This is followed by nephrologist and cardiologist, improved     Patient Instructions  Check blood sugars on waking up .. 4 .. times a week Also check blood sugars about 2 hours after a meal and do this after different meals by rotation  Recommended blood sugar levels on waking up is 90-130 and about 2 hours after meal is 140-180 Please bring blood sugar monitor to each visit.  Adjust insulin based on meal size and Carbs      Sarath Privott 11/25/2014, 4:12 PM

## 2014-11-24 ENCOUNTER — Other Ambulatory Visit: Payer: Self-pay | Admitting: Endocrinology

## 2014-11-26 ENCOUNTER — Other Ambulatory Visit (INDEPENDENT_AMBULATORY_CARE_PROVIDER_SITE_OTHER): Payer: Medicare Other

## 2014-11-26 DIAGNOSIS — IMO0002 Reserved for concepts with insufficient information to code with codable children: Secondary | ICD-10-CM

## 2014-11-26 DIAGNOSIS — E1165 Type 2 diabetes mellitus with hyperglycemia: Secondary | ICD-10-CM | POA: Diagnosis not present

## 2014-11-26 LAB — POCT GLYCOSYLATED HEMOGLOBIN (HGB A1C): HEMOGLOBIN A1C: 9.4

## 2014-12-03 ENCOUNTER — Other Ambulatory Visit: Payer: Self-pay

## 2014-12-03 NOTE — Patient Outreach (Signed)
[facility]) Care Management  12/03/2014  Mark [name] Jul 28, 1940 [id number]  Care Coordination Note:  -patient's request for insulin pump  Outbound call to patient to follow-up on 11/23/14 visit with Dr. Dwyane Dee.  -A1C 9.4 -noncompliant with blood sugars at lunch, supper or bedtime -diet and exercise  -insulin pump:  10/17/14 note: Per Dr. Dwyane Dee, "Again he is not able to qualify for the insulin pump because of relatively high C-peptide level done 3 time.  Patient is only interested in services with [facility] if [facility] can help facilitate a insulin pump.   RN CM advised patient that it is important he is compliant with MD's orders in checking his blood sugars.  RN CM advised that patient is non-eligible for insulin pump at this time.  RN CM advised to work on compliance and improve DM management and discuss this option again with MD in the future.  RN CM encouraged [facility] participation to provide education on DM but patient refused stating he has had 30 years and his doctor has already provided him with all that.  Patient does NOT consent for home visits or any further [facility] services.  RN CM advised should new needs arise or patient should change his mind; please request Dr. Dwyane Dee send a new request to [facility].   RN CM notified [facility]- case closed - refused to continue in program. RN CM notified Primary MD of case closure via letter.   [name], RN, BSN, [facility], CCM  [employer] Management Coordinator [phone] Direct [phone] Cell [phone] Office [phone] Fax

## 2014-12-06 DIAGNOSIS — E1142 Type 2 diabetes mellitus with diabetic polyneuropathy: Secondary | ICD-10-CM | POA: Diagnosis not present

## 2014-12-06 DIAGNOSIS — Z23 Encounter for immunization: Secondary | ICD-10-CM | POA: Diagnosis not present

## 2014-12-06 DIAGNOSIS — N183 Chronic kidney disease, stage 3 (moderate): Secondary | ICD-10-CM | POA: Diagnosis not present

## 2014-12-06 DIAGNOSIS — E780 Pure hypercholesterolemia: Secondary | ICD-10-CM | POA: Diagnosis not present

## 2014-12-06 DIAGNOSIS — I10 Essential (primary) hypertension: Secondary | ICD-10-CM | POA: Diagnosis not present

## 2014-12-06 DIAGNOSIS — R2681 Unsteadiness on feet: Secondary | ICD-10-CM | POA: Diagnosis not present

## 2014-12-06 DIAGNOSIS — I4891 Unspecified atrial fibrillation: Secondary | ICD-10-CM | POA: Diagnosis not present

## 2014-12-07 ENCOUNTER — Ambulatory Visit (INDEPENDENT_AMBULATORY_CARE_PROVIDER_SITE_OTHER): Payer: Medicare Other | Admitting: Podiatry

## 2014-12-07 ENCOUNTER — Encounter: Payer: Self-pay | Admitting: Podiatry

## 2014-12-07 VITALS — BP 138/82 | HR 76

## 2014-12-07 DIAGNOSIS — G629 Polyneuropathy, unspecified: Secondary | ICD-10-CM | POA: Diagnosis not present

## 2014-12-07 DIAGNOSIS — L97519 Non-pressure chronic ulcer of other part of right foot with unspecified severity: Secondary | ICD-10-CM

## 2014-12-07 DIAGNOSIS — E1342 Other specified diabetes mellitus with diabetic polyneuropathy: Secondary | ICD-10-CM

## 2014-12-07 DIAGNOSIS — M2041 Other hammer toe(s) (acquired), right foot: Secondary | ICD-10-CM

## 2014-12-07 DIAGNOSIS — E1142 Type 2 diabetes mellitus with diabetic polyneuropathy: Secondary | ICD-10-CM

## 2014-12-07 NOTE — Progress Notes (Signed)
Subjective:  74 year old male presents for follow up on right 5th digit ulcer. His appointment was not till next week, but he felt the area needs looked at on right 5th toe.   Objective:  Pre ulcerative corn 5th digit right with a history of slow wound healing.  Hypertrophic nails x 10.   Assessment:  Recurring ulcer 5th digit right has healed. DM peripheral neuropathy.   Plan:  Debrided ulcerated corn and placed aperture pad on 5th digit right with instruction. Return in 4 weeks.

## 2014-12-07 NOTE — Patient Instructions (Signed)
Seen for painful corn.  All nails and corns debrided and padded. Return in 4 weeks or as needed.

## 2014-12-11 ENCOUNTER — Ambulatory Visit: Payer: Medicare Other | Admitting: Podiatry

## 2014-12-11 NOTE — Patient Outreach (Signed)
[facility]) Care Management  12/11/2014  Mark [name] [birth year] [id number]   Notification from [name], RN to close case as patient withdrew from [place] Management services.  Thanks, [name]. [place], [place] Assistant Phone: [phone] Fax: [phone]

## 2014-12-13 DIAGNOSIS — R2689 Other abnormalities of gait and mobility: Secondary | ICD-10-CM | POA: Diagnosis not present

## 2014-12-13 DIAGNOSIS — Z7901 Long term (current) use of anticoagulants: Secondary | ICD-10-CM | POA: Diagnosis not present

## 2014-12-13 DIAGNOSIS — M6281 Muscle weakness (generalized): Secondary | ICD-10-CM | POA: Diagnosis not present

## 2014-12-13 DIAGNOSIS — I4891 Unspecified atrial fibrillation: Secondary | ICD-10-CM | POA: Diagnosis not present

## 2014-12-21 DIAGNOSIS — M6281 Muscle weakness (generalized): Secondary | ICD-10-CM | POA: Diagnosis not present

## 2014-12-21 DIAGNOSIS — R2689 Other abnormalities of gait and mobility: Secondary | ICD-10-CM | POA: Diagnosis not present

## 2014-12-24 DIAGNOSIS — R2689 Other abnormalities of gait and mobility: Secondary | ICD-10-CM | POA: Diagnosis not present

## 2014-12-24 DIAGNOSIS — M6281 Muscle weakness (generalized): Secondary | ICD-10-CM | POA: Diagnosis not present

## 2014-12-26 DIAGNOSIS — R2689 Other abnormalities of gait and mobility: Secondary | ICD-10-CM | POA: Diagnosis not present

## 2014-12-26 DIAGNOSIS — M6281 Muscle weakness (generalized): Secondary | ICD-10-CM | POA: Diagnosis not present

## 2014-12-27 DIAGNOSIS — R2689 Other abnormalities of gait and mobility: Secondary | ICD-10-CM | POA: Diagnosis not present

## 2014-12-27 DIAGNOSIS — M6281 Muscle weakness (generalized): Secondary | ICD-10-CM | POA: Diagnosis not present

## 2014-12-31 DIAGNOSIS — R2689 Other abnormalities of gait and mobility: Secondary | ICD-10-CM | POA: Diagnosis not present

## 2014-12-31 DIAGNOSIS — M6281 Muscle weakness (generalized): Secondary | ICD-10-CM | POA: Diagnosis not present

## 2015-01-02 DIAGNOSIS — M6281 Muscle weakness (generalized): Secondary | ICD-10-CM | POA: Diagnosis not present

## 2015-01-02 DIAGNOSIS — R2689 Other abnormalities of gait and mobility: Secondary | ICD-10-CM | POA: Diagnosis not present

## 2015-01-04 ENCOUNTER — Encounter: Payer: Self-pay | Admitting: Podiatry

## 2015-01-04 ENCOUNTER — Ambulatory Visit (INDEPENDENT_AMBULATORY_CARE_PROVIDER_SITE_OTHER): Payer: Medicare Other | Admitting: Podiatry

## 2015-01-04 DIAGNOSIS — G629 Polyneuropathy, unspecified: Secondary | ICD-10-CM

## 2015-01-04 DIAGNOSIS — E1142 Type 2 diabetes mellitus with diabetic polyneuropathy: Secondary | ICD-10-CM

## 2015-01-04 DIAGNOSIS — M6281 Muscle weakness (generalized): Secondary | ICD-10-CM | POA: Diagnosis not present

## 2015-01-04 DIAGNOSIS — E1342 Other specified diabetes mellitus with diabetic polyneuropathy: Secondary | ICD-10-CM

## 2015-01-04 DIAGNOSIS — R2689 Other abnormalities of gait and mobility: Secondary | ICD-10-CM | POA: Diagnosis not present

## 2015-01-04 DIAGNOSIS — M2041 Other hammer toe(s) (acquired), right foot: Secondary | ICD-10-CM

## 2015-01-04 DIAGNOSIS — L97519 Non-pressure chronic ulcer of other part of right foot with unspecified severity: Secondary | ICD-10-CM | POA: Diagnosis not present

## 2015-01-04 NOTE — Progress Notes (Signed)
Subjective:  74 year old male presents for follow up on right 5th digit ulcer.  Having problem with swelling and has not taken fluid pills since he need be on the road. Stated that he developed a scab over old wound site right mid anterior shin that broke off and left dark dry scab.   Objective:  Ulcerating corn 5th digit right. Dark crusted scab anterior shin a dime size with out opening. No drainage or associated redness noted.   Positive of excessive lower limb edema bilateral.   Assessment:  Recurring ulcer 5th digit right. Newly formed scab anterior shin right lower limb. Excess lower limb edema bilateral. DM peripheral neuropathy.   Plan:  Debrided ulcerated corn and placed aperture pad on 5th digit right with instruction. Remove the pad the day before the next appointment.  Return in 2 weeks.

## 2015-01-07 DIAGNOSIS — M6281 Muscle weakness (generalized): Secondary | ICD-10-CM | POA: Diagnosis not present

## 2015-01-07 DIAGNOSIS — R2689 Other abnormalities of gait and mobility: Secondary | ICD-10-CM | POA: Diagnosis not present

## 2015-01-09 DIAGNOSIS — M6281 Muscle weakness (generalized): Secondary | ICD-10-CM | POA: Diagnosis not present

## 2015-01-09 DIAGNOSIS — R2689 Other abnormalities of gait and mobility: Secondary | ICD-10-CM | POA: Diagnosis not present

## 2015-01-10 ENCOUNTER — Other Ambulatory Visit: Payer: Self-pay | Admitting: Endocrinology

## 2015-01-11 DIAGNOSIS — R2689 Other abnormalities of gait and mobility: Secondary | ICD-10-CM | POA: Diagnosis not present

## 2015-01-11 DIAGNOSIS — M6281 Muscle weakness (generalized): Secondary | ICD-10-CM | POA: Diagnosis not present

## 2015-01-14 DIAGNOSIS — I4891 Unspecified atrial fibrillation: Secondary | ICD-10-CM | POA: Diagnosis not present

## 2015-01-14 DIAGNOSIS — M6281 Muscle weakness (generalized): Secondary | ICD-10-CM | POA: Diagnosis not present

## 2015-01-14 DIAGNOSIS — Z79899 Other long term (current) drug therapy: Secondary | ICD-10-CM | POA: Diagnosis not present

## 2015-01-14 DIAGNOSIS — R2689 Other abnormalities of gait and mobility: Secondary | ICD-10-CM | POA: Diagnosis not present

## 2015-01-16 DIAGNOSIS — R2689 Other abnormalities of gait and mobility: Secondary | ICD-10-CM | POA: Diagnosis not present

## 2015-01-16 DIAGNOSIS — M6281 Muscle weakness (generalized): Secondary | ICD-10-CM | POA: Diagnosis not present

## 2015-01-17 ENCOUNTER — Ambulatory Visit (INDEPENDENT_AMBULATORY_CARE_PROVIDER_SITE_OTHER): Payer: Medicare Other | Admitting: Podiatry

## 2015-01-17 ENCOUNTER — Encounter: Payer: Self-pay | Admitting: Podiatry

## 2015-01-17 VITALS — BP 171/99 | HR 70

## 2015-01-17 DIAGNOSIS — R609 Edema, unspecified: Secondary | ICD-10-CM

## 2015-01-17 DIAGNOSIS — L97511 Non-pressure chronic ulcer of other part of right foot limited to breakdown of skin: Secondary | ICD-10-CM

## 2015-01-17 DIAGNOSIS — R600 Localized edema: Secondary | ICD-10-CM

## 2015-01-17 DIAGNOSIS — E1142 Type 2 diabetes mellitus with diabetic polyneuropathy: Secondary | ICD-10-CM

## 2015-01-17 NOTE — Patient Instructions (Signed)
Seen for ulcerating corn. Debrided and padded. Return in 2 weeks.

## 2015-01-17 NOTE — Progress Notes (Signed)
Subjective:  74 year old male presents for follow up on right 5th digit ulcer.   Objective:  Ulcerating corn 5th digit right. Positive of excessive lower limb edema bilateral.   Assessment:  Recurring ulcer 5th digit right. Newly formed scab anterior shin right lower limb. Excess lower limb edema bilateral. DM peripheral neuropathy.   Plan:  Debrided ulcerated corn and placed aperture pad on 5th digit right with instruction. Remove the pad the day before the next appointment.  Return in 2 weeks.

## 2015-01-18 DIAGNOSIS — R2689 Other abnormalities of gait and mobility: Secondary | ICD-10-CM | POA: Diagnosis not present

## 2015-01-18 DIAGNOSIS — M6281 Muscle weakness (generalized): Secondary | ICD-10-CM | POA: Diagnosis not present

## 2015-01-22 DIAGNOSIS — R2689 Other abnormalities of gait and mobility: Secondary | ICD-10-CM | POA: Diagnosis not present

## 2015-01-22 DIAGNOSIS — M6281 Muscle weakness (generalized): Secondary | ICD-10-CM | POA: Diagnosis not present

## 2015-01-24 ENCOUNTER — Ambulatory Visit (INDEPENDENT_AMBULATORY_CARE_PROVIDER_SITE_OTHER): Payer: Medicare Other | Admitting: Endocrinology

## 2015-01-24 VITALS — BP 142/84 | HR 83 | Temp 98.2°F | Resp 14 | Ht 69.0 in | Wt 231.4 lb

## 2015-01-24 DIAGNOSIS — M6281 Muscle weakness (generalized): Secondary | ICD-10-CM | POA: Diagnosis not present

## 2015-01-24 DIAGNOSIS — E1149 Type 2 diabetes mellitus with other diabetic neurological complication: Secondary | ICD-10-CM

## 2015-01-24 DIAGNOSIS — E785 Hyperlipidemia, unspecified: Secondary | ICD-10-CM

## 2015-01-24 DIAGNOSIS — E1165 Type 2 diabetes mellitus with hyperglycemia: Secondary | ICD-10-CM | POA: Diagnosis not present

## 2015-01-24 DIAGNOSIS — IMO0002 Reserved for concepts with insufficient information to code with codable children: Secondary | ICD-10-CM

## 2015-01-24 DIAGNOSIS — Z794 Long term (current) use of insulin: Secondary | ICD-10-CM | POA: Diagnosis not present

## 2015-01-24 DIAGNOSIS — R2689 Other abnormalities of gait and mobility: Secondary | ICD-10-CM | POA: Diagnosis not present

## 2015-01-24 LAB — COMPREHENSIVE METABOLIC PANEL
ALT: 20 U/L (ref 0–53)
AST: 29 U/L (ref 0–37)
Albumin: 3.3 g/dL — ABNORMAL LOW (ref 3.5–5.2)
Alkaline Phosphatase: 48 U/L (ref 39–117)
BUN: 34 mg/dL — ABNORMAL HIGH (ref 6–23)
CALCIUM: 9.6 mg/dL (ref 8.4–10.5)
CHLORIDE: 99 meq/L (ref 96–112)
CO2: 31 meq/L (ref 19–32)
Creatinine, Ser: 1.89 mg/dL — ABNORMAL HIGH (ref 0.40–1.50)
GFR: 37.16 mL/min — AB (ref >60.00)
Glucose, Bld: 186 mg/dL — ABNORMAL HIGH (ref 70–99)
Potassium: 3.8 mEq/L (ref 3.5–5.1)
Sodium: 138 mEq/L (ref 135–145)
Total Bilirubin: 1 mg/dL (ref 0.2–1.2)
Total Protein: 6.9 g/dL (ref 6.0–8.3)

## 2015-01-24 LAB — LDL CHOLESTEROL, DIRECT: Direct LDL: 133 mg/dL

## 2015-01-24 NOTE — Patient Instructions (Addendum)
Insulin 50 units in am on waking up and 50 before lunch and 55 before supper  Restart Crestor

## 2015-01-24 NOTE — Progress Notes (Signed)
Quick Note:  Please let patient know creatinine is 1.9 Cholesterol is high, to start Crestor again and may take half tablet daily ______

## 2015-01-24 NOTE — Progress Notes (Signed)
Patient ID: Mark [name], male   DOB: 1940/05/20, 74 y.o.   MRN: [id number]   Reason for Appointment: Diabetes follow-up   History of Present Illness   Diagnosis: Type 2 DIABETES MELITUS, long-standing and insulin-dependent      Previous history: He has been on basal bolus insulin regimen for several years. Previously had been taking much larger doses of Lantus but is now requiring less since he had lost weight and had intercurrent illnesses He had done very well with Victoza in the past which had helped weight control and glucose but he had nausea and did not want to pay the high cost Because of his difficulty affording Lantus and Humalog insulin he was switched to NPH and Humalog in 5/15  Recent history:    Insulin regimen: Humulin R U-500: 45 at Bfst 55  lunch and at supper     Because of the high cost of using Toujeo and Humalog and inability to get an insulin pump because of his high C-peptide level he was switched to U-500 insulin in 10/2014 His blood sugars have been gradually improving with this after periodic adjustment of the dosage Last A1c was still high at 9.4 in 8/16  His A1c has been consistently high since 7/14 and at the highest level in 8/16  Current blood sugar patterns and problems identified:  He has been checking blood sugars mostly in the mornings despite reminders to check blood sugars at various times  His fasting blood sugars have been somewhat variable but overall are better on an average compared to last visit  He may have forgotten his insulin for a couple of days and blood sugars went up to over 300, highest reading 43 fasting  He has a few readings at lunch time which appear to be higher than in the morning  Blood sugars are rarely checked at suppertime and they appear to be relatively low; he thinks he may feel hypoglycemic in the late afternoon about once a week  He keeps forgetting to check his blood sugars after supper at  night  Not having any hypoglycemia overnight  Usually compliant with taking his insulin 30 minutes before eating  Still eating out frequently and has variable carbohydrate fat intake.  Trying to avoid cereal at breakfast  He is not able to exercise  Noninsulin hypoglycemic drugs: none       Monitors blood glucose:  1-2 times a day.    Glucometer: Verio       Blood Glucose readings: Glucometer download reviewed:   Mean values apply above for all meters except median for One Touch  PRE-MEAL Fasting Lunch Dinner Bedtime Overall  Glucose range:  91-483   293-313  63-353     Mean/median: 160     164+/-93     Meals: 3 meals per day. for breakfast he May have frozen meat croissant; cereal 1-2/7 days;  sometimes outside meals may be high fat; eating out once a day  and trying to keep portions small .  Frequently has no snacks; having dinner 8 pm    Physical activity: exercise: very limited walking           Wt Readings from Last 3 Encounters:  01/24/15 231 lb 6.4 oz (104.962 kg)  11/23/14 224 lb 3.2 oz (101.696 kg)  11/22/14 212 lb (96.163 kg)     Lab Results  Component Value Date   HGBA1C 9.4 11/26/2014   HGBA1C 9.3* 06/12/2014   HGBA1C 8.6* 03/16/2014  Lab Results  Component Value Date   MICROALBUR 24.5* 03/16/2014   LDLCALC 88 09/27/2013   CREATININE 1.76* 10/17/2014        Medication List       This list is accurate as of: 01/24/15  5:05 PM.  Always use your most recent med list.               allopurinol 300 MG tablet  Commonly known as:  ZYLOPRIM  TAKE 1/2 OF A TABLET BY MOUTH ONCE DAILY.     BD PEN NEEDLE NANO U/F 32G X 4 MM Misc  Generic drug:  Insulin Pen Needle  USE AS DIRECTED 5 TIMES DAILY SUBQUE.     calcitRIOL 0.5 MCG capsule  Commonly known as:  ROCALTROL  Take one by mouth daily     DIGOX 0.125 MG tablet  Generic drug:  digoxin  Take one by mouth daily     fenofibrate micronized 134 MG capsule  Commonly known as:  LOFIBRA  TAKE ONE  CAPSULE EVERY DAY WITH A MEAL     furosemide 80 MG tablet  Commonly known as:  LASIX  Take 2 tabs by mouth every am and 1 every pm     HYDROcodone-acetaminophen 5-325 MG tablet  Commonly known as:  NORCO/VICODIN  Take 1 by mouth every 4-6 hours as needed     insulin regular human CONCENTRATED 500 UNIT/ML injection  Commonly known as:  HUMULIN R U-500 KWIKPEN  Inject 60 units three times a day     INSULIN SYRINGE .3CC/31GX5/16" 31G X 5/16" 0.3 ML Misc  Use as directed to inject insulin     ketoconazole 2 % shampoo  Commonly known as:  NIZORAL     levothyroxine 88 MCG tablet  Commonly known as:  SYNTHROID, LEVOTHROID  TAKE ONE TABLET BY MOUTH IN THE MORNING ON AN EMPTY STOMACH     metolazone 2.5 MG tablet  Commonly known as:  ZAROXOLYN     metoprolol succinate 50 MG 24 hr tablet  Commonly known as:  TOPROL-XL  Take one by mouth daily     mupirocin ointment 2 %  Commonly known as:  BACTROBAN     nystatin 100000 UNIT/GM Powd     omeprazole 20 MG capsule  Commonly known as:  PRILOSEC  Take 20 mg by mouth daily.     ONETOUCH DELICA LANCETS FINE Misc  Use to check blood sugar 4 times per day dx code 250.02     ONETOUCH VERIO test strip  Generic drug:  glucose blood  USE AS INSTRUCTED TO CHECK BLOOD SUGAR 4 TIMES A DAY     oxyCODONE-acetaminophen 7.5-325 MG tablet  Commonly known as:  PERCOCET  Take 1 tablet by mouth.     PHOSPHA 250 NEUTRAL 155-852-130 MG tablet  Generic drug:  phosphorus     potassium chloride SA 20 MEQ tablet  Commonly known as:  K-DUR,KLOR-CON  Take 20 mEq by mouth 2 (two) times daily. Takes 2 tablets in am and 3 in pm     predniSONE 5 MG tablet  Commonly known as:  DELTASONE  Take 5 mg by mouth daily.     PROGRAF 0.5 MG capsule  Generic drug:  tacrolimus  Take 0.5 mg by mouth 2 (two) times daily.     RAPAMUNE 0.5 MG Tabs  Generic drug:  Sirolimus  Take by mouth daily.     rosuvastatin 20 MG tablet  Commonly known as:  CRESTOR  Take  1 tablet  daily     SSD 1 % cream  Generic drug:  silver sulfADIAZINE  Apply to affected area twice a day     warfarin 5 MG tablet  Commonly known as:  COUMADIN  Take one by mouth daily        Allergies: No Known Allergies  Past Medical History  Diagnosis Date  . Diabetes ([place])   . Hypertension   . Gout   . Hypothyroidism   . Ulcer of other part of foot 12/15/2013    Past Surgical History  Procedure Laterality Date  . Melanoma excision    . Ankle fracture surgery    . Cataract extraction      No family history on file.  Social History:  reports that he has never smoked. He has never used smokeless tobacco. He reports that he does not drink alcohol or use illicit drugs.  Review of Systems   Cardiovascular ROS: positive for history of chronic atrial fibrillation  Edema is chronic, treated with with doses of Lasix (200mg  bid) and Zaroxlyn prn He does not like Elastic stockings as they are difficult to put on and edema is persistent  RENAL dysfunction: This is being followed  by his nephrologist Recent level as follows, he thinks most recent creatinine was about 2.0   Lab Results  Component Value Date   CREATININE 1.76* 10/17/2014    Mild hypothyroidism, stable on 88 mcg levothyroxine; TSH last normal; compliant with supplement. Overall feels fairly good  Lab Results  Component Value Date   TSH 1.88 10/17/2014   HYPERLIPIDEMIA: Even with taking full doses of fenofibrate 3/7 days a week Previously compliant with Crestor but he stopped this because of the high cost His triglycerides are high fairly consistently   Previously had intolerance to niacin  His diet is periodically  high in fat if eating out  Lab Results  Component Value Date   CHOL 195 10/17/2014   HDL 27.70* 10/17/2014   LDLCALC 88 09/27/2013   LDLDIRECT 96.0 10/17/2014   TRIG 313.0* 10/17/2014   CHOLHDL 7 10/17/2014    NEUROPATHY: He has significant numbness in feet, no significant  discomfort or paresthesia.  He was taken off gabapentin because of some balance issues .     Examination:   BP 142/84 mmHg  Pulse 83  Temp(Src) 98.2 F (36.8 C)  Resp 14  Ht 5\' 9"  (1.753 m)  Wt 231 lb 6.4 oz (104.962 kg)  BMI 34.16 kg/m2  SpO2 96%  Body mass index is 34.16 kg/(m^2).    Diabetic foot exam shows absent monofilament sensation in the toes and plantar surfaces, flat feet, no skin lesions or ulcers on the feet and absent pulses He had 3+ ankle edema bilaterally Has mild erythema on the left lower leg medially but no warmth or tenderness  Assesment/ PLAN:  DIABETES: His blood sugars are improving with switching to U-500 insulin KwikPen See history of present illness for detailed discussion of his current blood sugar patterns, insulin regimen and problems identified He has variability in his blood sugars in the mornings and not clear if this is related to his diet the night before He forgets to check his blood sugars after supper Difficult to adjust his mealtime doses as he usually does not do postprandial reading However appears to have increase in his blood sugar after breakfast possibly from taking prednisone in the morning  Also tends to have relatively low blood sugars at suppertime He has difficulty following diagnoses consistently specially  with eating out frequently  For now will increase his insulin by 5 units at breakfast, reduce it 5 units at lunch Discussed at he will need to adjust his suppertime dose based on postprandial readings at night Encouraged him to be as active as possible  HYPERTENSION: This is followed by nephrologist and cardiologist  NEUROPATHY: He has sensory loss, foot care discussed  Edema: He refuses to consider elastic stockings for persistent dependent edema   HYPERCHOLESTEROLEMIA/high triglycerides.  He stopped taking his Crestor because of cost and advised him to restart as he is not in the donut hole now  Patient Instructions    Insulin 50 units in am on waking up and 50 before lunch and 55 before supper  Restart Crestor    Counseling time on subjects discussed above is over 50% of today's 25 minute visit  Ilyanna Baillargeon 01/24/2015, 5:05 PM

## 2015-01-25 LAB — FRUCTOSAMINE: Fructosamine: 264 umol/L (ref 0–285)

## 2015-01-28 DIAGNOSIS — M6281 Muscle weakness (generalized): Secondary | ICD-10-CM | POA: Diagnosis not present

## 2015-01-28 DIAGNOSIS — R2689 Other abnormalities of gait and mobility: Secondary | ICD-10-CM | POA: Diagnosis not present

## 2015-01-31 ENCOUNTER — Ambulatory Visit (INDEPENDENT_AMBULATORY_CARE_PROVIDER_SITE_OTHER): Payer: Medicare Other | Admitting: Podiatry

## 2015-01-31 ENCOUNTER — Encounter: Payer: Self-pay | Admitting: Podiatry

## 2015-01-31 DIAGNOSIS — L97511 Non-pressure chronic ulcer of other part of right foot limited to breakdown of skin: Secondary | ICD-10-CM | POA: Diagnosis not present

## 2015-01-31 DIAGNOSIS — R2689 Other abnormalities of gait and mobility: Secondary | ICD-10-CM | POA: Diagnosis not present

## 2015-01-31 DIAGNOSIS — E1142 Type 2 diabetes mellitus with diabetic polyneuropathy: Secondary | ICD-10-CM

## 2015-01-31 DIAGNOSIS — M2041 Other hammer toe(s) (acquired), right foot: Secondary | ICD-10-CM | POA: Diagnosis not present

## 2015-01-31 DIAGNOSIS — M6281 Muscle weakness (generalized): Secondary | ICD-10-CM | POA: Diagnosis not present

## 2015-01-31 NOTE — Progress Notes (Signed)
Subjective:  74 year old male presents for follow up on right 5th digit ulcer. Kept the pad for a week. Been to physical therapy and on feet more now.  Objective:  Ulcerating corn 5th digit right. Positive of excessive lower limb edema bilateral.   Assessment:  Recurring ulcer 5th digit right. Newly formed scab anterior shin right lower limb. Excess lower limb edema bilateral. DM peripheral neuropathy.   Plan:  Debrided ulcerated corn and placed aperture pad on 5th digit right with instruction. Remove the pad the day before the next appointment.  Return in 2 weeks.

## 2015-01-31 NOTE — Patient Instructions (Signed)
Seen for pre ulcerative corn. Debrided and padded. Return in 2 weeks or as needed.

## 2015-02-04 DIAGNOSIS — E782 Mixed hyperlipidemia: Secondary | ICD-10-CM | POA: Diagnosis not present

## 2015-02-04 DIAGNOSIS — M545 Low back pain: Secondary | ICD-10-CM | POA: Diagnosis not present

## 2015-02-04 DIAGNOSIS — R2681 Unsteadiness on feet: Secondary | ICD-10-CM | POA: Diagnosis not present

## 2015-02-04 DIAGNOSIS — N183 Chronic kidney disease, stage 3 (moderate): Secondary | ICD-10-CM | POA: Diagnosis not present

## 2015-02-04 DIAGNOSIS — I10 Essential (primary) hypertension: Secondary | ICD-10-CM | POA: Diagnosis not present

## 2015-02-04 DIAGNOSIS — I482 Chronic atrial fibrillation: Secondary | ICD-10-CM | POA: Diagnosis not present

## 2015-02-04 DIAGNOSIS — M25551 Pain in right hip: Secondary | ICD-10-CM | POA: Diagnosis not present

## 2015-02-04 DIAGNOSIS — E1142 Type 2 diabetes mellitus with diabetic polyneuropathy: Secondary | ICD-10-CM | POA: Diagnosis not present

## 2015-02-05 DIAGNOSIS — M1611 Unilateral primary osteoarthritis, right hip: Secondary | ICD-10-CM | POA: Diagnosis not present

## 2015-02-05 DIAGNOSIS — M47896 Other spondylosis, lumbar region: Secondary | ICD-10-CM | POA: Diagnosis not present

## 2015-02-05 DIAGNOSIS — M5136 Other intervertebral disc degeneration, lumbar region: Secondary | ICD-10-CM | POA: Diagnosis not present

## 2015-02-05 DIAGNOSIS — M6281 Muscle weakness (generalized): Secondary | ICD-10-CM | POA: Diagnosis not present

## 2015-02-05 DIAGNOSIS — I7090 Unspecified atherosclerosis: Secondary | ICD-10-CM | POA: Diagnosis not present

## 2015-02-05 DIAGNOSIS — M4806 Spinal stenosis, lumbar region: Secondary | ICD-10-CM | POA: Diagnosis not present

## 2015-02-05 DIAGNOSIS — M2578 Osteophyte, vertebrae: Secondary | ICD-10-CM | POA: Diagnosis not present

## 2015-02-05 DIAGNOSIS — R2689 Other abnormalities of gait and mobility: Secondary | ICD-10-CM | POA: Diagnosis not present

## 2015-02-05 DIAGNOSIS — M160 Bilateral primary osteoarthritis of hip: Secondary | ICD-10-CM | POA: Diagnosis not present

## 2015-02-05 DIAGNOSIS — M25551 Pain in right hip: Secondary | ICD-10-CM | POA: Diagnosis not present

## 2015-02-07 DIAGNOSIS — M6281 Muscle weakness (generalized): Secondary | ICD-10-CM | POA: Diagnosis not present

## 2015-02-07 DIAGNOSIS — R2689 Other abnormalities of gait and mobility: Secondary | ICD-10-CM | POA: Diagnosis not present

## 2015-02-12 DIAGNOSIS — M25551 Pain in right hip: Secondary | ICD-10-CM | POA: Diagnosis not present

## 2015-02-12 DIAGNOSIS — M545 Low back pain: Secondary | ICD-10-CM | POA: Diagnosis not present

## 2015-02-13 ENCOUNTER — Ambulatory Visit: Payer: Medicare Other | Admitting: Podiatry

## 2015-02-14 ENCOUNTER — Ambulatory Visit (INDEPENDENT_AMBULATORY_CARE_PROVIDER_SITE_OTHER): Payer: Medicare Other | Admitting: Podiatry

## 2015-02-14 ENCOUNTER — Encounter: Payer: Self-pay | Admitting: Podiatry

## 2015-02-14 DIAGNOSIS — M545 Low back pain: Secondary | ICD-10-CM | POA: Diagnosis not present

## 2015-02-14 DIAGNOSIS — L97511 Non-pressure chronic ulcer of other part of right foot limited to breakdown of skin: Secondary | ICD-10-CM | POA: Diagnosis not present

## 2015-02-14 DIAGNOSIS — M2041 Other hammer toe(s) (acquired), right foot: Secondary | ICD-10-CM | POA: Diagnosis not present

## 2015-02-14 DIAGNOSIS — M25551 Pain in right hip: Secondary | ICD-10-CM | POA: Diagnosis not present

## 2015-02-14 NOTE — Patient Instructions (Signed)
Ulcer is dry and clean, well healed 5th digit right. Debrided and padded. Return in 3 week.

## 2015-02-15 DIAGNOSIS — E1129 Type 2 diabetes mellitus with other diabetic kidney complication: Secondary | ICD-10-CM | POA: Diagnosis not present

## 2015-02-15 DIAGNOSIS — N2581 Secondary hyperparathyroidism of renal origin: Secondary | ICD-10-CM | POA: Diagnosis not present

## 2015-02-15 DIAGNOSIS — Z940 Kidney transplant status: Secondary | ICD-10-CM | POA: Diagnosis not present

## 2015-02-15 NOTE — Progress Notes (Signed)
Subjective:  74 year old male presents for follow up on right 5th digit ulcer. Has done well for the last 2 weeks.  Objective:  Ulcerating corn 5th digit right has dried with build up callus. Positive of excessive lower limb edema bilateral.   Assessment:  Recurring ulcer 5th digit right. Newly formed scab anterior shin right lower limb. Excess lower limb edema bilateral. DM peripheral neuropathy.   Plan:  Debrided ulcerated corn and placed aperture pad on 5th digit right with instruction. Remove the pad the day before the next appointment.  Return in 3 weeks.

## 2015-02-19 DIAGNOSIS — M545 Low back pain: Secondary | ICD-10-CM | POA: Diagnosis not present

## 2015-02-19 DIAGNOSIS — M25551 Pain in right hip: Secondary | ICD-10-CM | POA: Diagnosis not present

## 2015-02-21 DIAGNOSIS — M545 Low back pain: Secondary | ICD-10-CM | POA: Diagnosis not present

## 2015-02-21 DIAGNOSIS — M25551 Pain in right hip: Secondary | ICD-10-CM | POA: Diagnosis not present

## 2015-02-22 ENCOUNTER — Ambulatory Visit (INDEPENDENT_AMBULATORY_CARE_PROVIDER_SITE_OTHER): Payer: Medicare Other | Admitting: Podiatry

## 2015-02-22 ENCOUNTER — Encounter: Payer: Self-pay | Admitting: Podiatry

## 2015-02-22 DIAGNOSIS — E1142 Type 2 diabetes mellitus with diabetic polyneuropathy: Secondary | ICD-10-CM

## 2015-02-22 DIAGNOSIS — L089 Local infection of the skin and subcutaneous tissue, unspecified: Secondary | ICD-10-CM

## 2015-02-22 DIAGNOSIS — S90829A Blister (nonthermal), unspecified foot, initial encounter: Secondary | ICD-10-CM

## 2015-02-22 DIAGNOSIS — S90821A Blister (nonthermal), right foot, initial encounter: Principal | ICD-10-CM

## 2015-02-22 NOTE — Progress Notes (Signed)
Subjective:  74 year old male presents stating that he has noted of a blister on his right heel early this week.  He is doing well with the lesion on 5th toe right.Kept the pad for a week. Been to physical therapy and on feet more now.  Objective:  Ulcerating corn 5th digit right has healed well. New blood blister right heel 1.2cm in diameter at posterior inferior surface without inflammation or cellulitis.Marland Kitchen  Positive of excessive lower limb edema bilateral.  Thick dystrophic nails 1-5 left.   Assessment:  Healed ulcer 5th digit right. New blood blister 1.2 cm right heel possible due to pressure from bed. Excess lower limb edema bilateral. DM peripheral neuropathy.  Onychomycosis.  Plan:  Debrided all nails on left foot.  Aperture pad 1/4" thick felt pad placed on right posterior heel with porous tape. Keep the pad till return.  Return in 2 weeks if no new problem arise.Marland Kitchen

## 2015-02-22 NOTE — Patient Instructions (Signed)
New blood blister right heel. Keep the pad till return in two weeks.

## 2015-02-25 DIAGNOSIS — M545 Low back pain: Secondary | ICD-10-CM | POA: Diagnosis not present

## 2015-02-25 DIAGNOSIS — M25551 Pain in right hip: Secondary | ICD-10-CM | POA: Diagnosis not present

## 2015-02-27 DIAGNOSIS — M25551 Pain in right hip: Secondary | ICD-10-CM | POA: Diagnosis not present

## 2015-02-27 DIAGNOSIS — M545 Low back pain: Secondary | ICD-10-CM | POA: Diagnosis not present

## 2015-02-28 ENCOUNTER — Other Ambulatory Visit: Payer: Self-pay | Admitting: Endocrinology

## 2015-03-04 DIAGNOSIS — M25551 Pain in right hip: Secondary | ICD-10-CM | POA: Diagnosis not present

## 2015-03-04 DIAGNOSIS — M545 Low back pain: Secondary | ICD-10-CM | POA: Diagnosis not present

## 2015-03-05 DIAGNOSIS — Z940 Kidney transplant status: Secondary | ICD-10-CM | POA: Diagnosis not present

## 2015-03-05 DIAGNOSIS — L821 Other seborrheic keratosis: Secondary | ICD-10-CM | POA: Diagnosis not present

## 2015-03-05 DIAGNOSIS — L700 Acne vulgaris: Secondary | ICD-10-CM | POA: Diagnosis not present

## 2015-03-05 DIAGNOSIS — N185 Chronic kidney disease, stage 5: Secondary | ICD-10-CM | POA: Diagnosis not present

## 2015-03-05 DIAGNOSIS — L570 Actinic keratosis: Secondary | ICD-10-CM | POA: Diagnosis not present

## 2015-03-07 ENCOUNTER — Encounter: Payer: Self-pay | Admitting: Podiatry

## 2015-03-07 ENCOUNTER — Ambulatory Visit (INDEPENDENT_AMBULATORY_CARE_PROVIDER_SITE_OTHER): Payer: Medicare Other | Admitting: Podiatry

## 2015-03-07 DIAGNOSIS — L97511 Non-pressure chronic ulcer of other part of right foot limited to breakdown of skin: Secondary | ICD-10-CM

## 2015-03-07 DIAGNOSIS — M2041 Other hammer toe(s) (acquired), right foot: Secondary | ICD-10-CM | POA: Diagnosis not present

## 2015-03-07 DIAGNOSIS — M545 Low back pain: Secondary | ICD-10-CM | POA: Diagnosis not present

## 2015-03-07 DIAGNOSIS — M25551 Pain in right hip: Secondary | ICD-10-CM | POA: Diagnosis not present

## 2015-03-07 NOTE — Progress Notes (Signed)
Subjective:  74 year old male presents for follow up on right 5th digit ulcer.  Right heel blister is still presents without increasing in size.   Objective:  Ulcerating corn 5th digit right has dried with build up callus. Positive of excessive lower limb edema bilateral L>R.  Pressure blister right heel.  Assessment:  Recurring ulcer 5th digit right. Newly formed scab anterior shin right lower limb. Pressure blister with blood inside. Excess lower limb edema bilateral. DM peripheral neuropathy.   Plan:  Debrided ulcerated corn and placed aperture pad on 5th digit right with instruction. Remove the pad the day before the next appointment.  Instructed to take weight off of the right heel while in bed. Return in 3 weeks.

## 2015-03-07 NOTE — Patient Instructions (Signed)
Ulcerating 5th toe placed in aperture pad. Return in 3 weeks.

## 2015-03-12 DIAGNOSIS — M545 Low back pain: Secondary | ICD-10-CM | POA: Diagnosis not present

## 2015-03-12 DIAGNOSIS — M25551 Pain in right hip: Secondary | ICD-10-CM | POA: Diagnosis not present

## 2015-03-14 DIAGNOSIS — Z79899 Other long term (current) drug therapy: Secondary | ICD-10-CM | POA: Diagnosis not present

## 2015-03-14 DIAGNOSIS — Z794 Long term (current) use of insulin: Secondary | ICD-10-CM | POA: Diagnosis not present

## 2015-03-14 DIAGNOSIS — K219 Gastro-esophageal reflux disease without esophagitis: Secondary | ICD-10-CM | POA: Diagnosis not present

## 2015-03-14 DIAGNOSIS — S0031XA Abrasion of nose, initial encounter: Secondary | ICD-10-CM | POA: Diagnosis not present

## 2015-03-14 DIAGNOSIS — M25561 Pain in right knee: Secondary | ICD-10-CM | POA: Diagnosis not present

## 2015-03-14 DIAGNOSIS — S0121XA Laceration without foreign body of nose, initial encounter: Secondary | ICD-10-CM | POA: Diagnosis not present

## 2015-03-14 DIAGNOSIS — S80212A Abrasion, left knee, initial encounter: Secondary | ICD-10-CM | POA: Diagnosis not present

## 2015-03-14 DIAGNOSIS — M25562 Pain in left knee: Secondary | ICD-10-CM | POA: Diagnosis not present

## 2015-03-14 DIAGNOSIS — R7989 Other specified abnormal findings of blood chemistry: Secondary | ICD-10-CM | POA: Diagnosis not present

## 2015-03-14 DIAGNOSIS — E7800 Pure hypercholesterolemia, unspecified: Secondary | ICD-10-CM | POA: Diagnosis not present

## 2015-03-14 DIAGNOSIS — I4891 Unspecified atrial fibrillation: Secondary | ICD-10-CM | POA: Diagnosis not present

## 2015-03-14 DIAGNOSIS — Z7901 Long term (current) use of anticoagulants: Secondary | ICD-10-CM | POA: Diagnosis not present

## 2015-03-14 DIAGNOSIS — R079 Chest pain, unspecified: Secondary | ICD-10-CM | POA: Diagnosis not present

## 2015-03-14 DIAGNOSIS — S0181XA Laceration without foreign body of other part of head, initial encounter: Secondary | ICD-10-CM | POA: Diagnosis not present

## 2015-03-14 DIAGNOSIS — S80211A Abrasion, right knee, initial encounter: Secondary | ICD-10-CM | POA: Diagnosis not present

## 2015-03-14 DIAGNOSIS — E1165 Type 2 diabetes mellitus with hyperglycemia: Secondary | ICD-10-CM | POA: Diagnosis not present

## 2015-03-14 DIAGNOSIS — S0081XA Abrasion of other part of head, initial encounter: Secondary | ICD-10-CM | POA: Diagnosis not present

## 2015-03-14 DIAGNOSIS — I10 Essential (primary) hypertension: Secondary | ICD-10-CM | POA: Diagnosis not present

## 2015-03-19 DIAGNOSIS — Z4822 Encounter for aftercare following kidney transplant: Secondary | ICD-10-CM | POA: Diagnosis not present

## 2015-03-19 DIAGNOSIS — E1122 Type 2 diabetes mellitus with diabetic chronic kidney disease: Secondary | ICD-10-CM | POA: Diagnosis not present

## 2015-03-19 DIAGNOSIS — Z7901 Long term (current) use of anticoagulants: Secondary | ICD-10-CM | POA: Diagnosis not present

## 2015-03-19 DIAGNOSIS — Z794 Long term (current) use of insulin: Secondary | ICD-10-CM | POA: Diagnosis not present

## 2015-03-19 DIAGNOSIS — Z85828 Personal history of other malignant neoplasm of skin: Secondary | ICD-10-CM | POA: Diagnosis not present

## 2015-03-19 DIAGNOSIS — N186 End stage renal disease: Secondary | ICD-10-CM | POA: Diagnosis not present

## 2015-03-19 DIAGNOSIS — I120 Hypertensive chronic kidney disease with stage 5 chronic kidney disease or end stage renal disease: Secondary | ICD-10-CM | POA: Diagnosis not present

## 2015-03-19 DIAGNOSIS — Z79899 Other long term (current) drug therapy: Secondary | ICD-10-CM | POA: Diagnosis not present

## 2015-03-19 DIAGNOSIS — E119 Type 2 diabetes mellitus without complications: Secondary | ICD-10-CM | POA: Diagnosis not present

## 2015-03-19 DIAGNOSIS — R609 Edema, unspecified: Secondary | ICD-10-CM | POA: Diagnosis not present

## 2015-03-19 DIAGNOSIS — Z940 Kidney transplant status: Secondary | ICD-10-CM | POA: Diagnosis not present

## 2015-03-19 DIAGNOSIS — D899 Disorder involving the immune mechanism, unspecified: Secondary | ICD-10-CM | POA: Diagnosis not present

## 2015-03-19 DIAGNOSIS — R600 Localized edema: Secondary | ICD-10-CM | POA: Diagnosis not present

## 2015-03-19 DIAGNOSIS — I4891 Unspecified atrial fibrillation: Secondary | ICD-10-CM | POA: Diagnosis not present

## 2015-03-22 ENCOUNTER — Other Ambulatory Visit: Payer: Self-pay | Admitting: Endocrinology

## 2015-03-27 ENCOUNTER — Ambulatory Visit (INDEPENDENT_AMBULATORY_CARE_PROVIDER_SITE_OTHER): Payer: Medicare Other | Admitting: Podiatry

## 2015-03-27 ENCOUNTER — Encounter: Payer: Self-pay | Admitting: Podiatry

## 2015-03-27 DIAGNOSIS — L97511 Non-pressure chronic ulcer of other part of right foot limited to breakdown of skin: Secondary | ICD-10-CM | POA: Diagnosis not present

## 2015-03-27 DIAGNOSIS — M2041 Other hammer toe(s) (acquired), right foot: Secondary | ICD-10-CM

## 2015-03-27 DIAGNOSIS — E1142 Type 2 diabetes mellitus with diabetic polyneuropathy: Secondary | ICD-10-CM

## 2015-03-27 NOTE — Progress Notes (Signed)
Subjective:  74 year old male presents walking with a cane for follow up on right 5th digit ulcer.  Stated that he has had a fall 2 weeks ago and got bruised face and broken skin on right lower leg. He hasn't been walking much since the fall.   Objective:  Ulcerating corn 5th digit right has dried with build up callus. Positive of excessive lower limb edema bilateral L>R.  Pressure blister right heel. Dry hard scab on right lower limb over old scar with surrounding redness of skin from hyperemia.  Assessment:  Recurring ulcer 5th digit right. Newly formed scab anterior shin right lower limb from a fall. Excess lower limb edema bilateral. DM peripheral neuropathy.   Plan:  Debrided ulcerated corn and placed aperture pad on 5th digit right with instruction. Return in 3 weeks.

## 2015-03-27 NOTE — Patient Instructions (Signed)
Seen for ulcerating 5th toe right. Healing in progress. Return in 3 weeks.

## 2015-03-28 ENCOUNTER — Ambulatory Visit: Payer: Medicare Other | Admitting: Podiatry

## 2015-04-01 ENCOUNTER — Other Ambulatory Visit: Payer: Self-pay | Admitting: Endocrinology

## 2015-04-09 DIAGNOSIS — H35372 Puckering of macula, left eye: Secondary | ICD-10-CM | POA: Diagnosis not present

## 2015-04-09 DIAGNOSIS — Z961 Presence of intraocular lens: Secondary | ICD-10-CM | POA: Diagnosis not present

## 2015-04-09 DIAGNOSIS — E113519 Type 2 diabetes mellitus with proliferative diabetic retinopathy with macular edema, unspecified eye: Secondary | ICD-10-CM | POA: Diagnosis not present

## 2015-04-09 DIAGNOSIS — E11311 Type 2 diabetes mellitus with unspecified diabetic retinopathy with macular edema: Secondary | ICD-10-CM | POA: Diagnosis not present

## 2015-04-09 LAB — HM DIABETES EYE EXAM

## 2015-04-10 ENCOUNTER — Encounter: Payer: Self-pay | Admitting: *Deleted

## 2015-04-12 ENCOUNTER — Other Ambulatory Visit: Payer: Self-pay | Admitting: Endocrinology

## 2015-04-17 ENCOUNTER — Encounter: Payer: Self-pay | Admitting: Podiatry

## 2015-04-17 ENCOUNTER — Ambulatory Visit (INDEPENDENT_AMBULATORY_CARE_PROVIDER_SITE_OTHER): Payer: Medicare Other | Admitting: Podiatry

## 2015-04-17 DIAGNOSIS — M79606 Pain in leg, unspecified: Secondary | ICD-10-CM

## 2015-04-17 DIAGNOSIS — E1142 Type 2 diabetes mellitus with diabetic polyneuropathy: Secondary | ICD-10-CM | POA: Diagnosis not present

## 2015-04-17 DIAGNOSIS — M2041 Other hammer toe(s) (acquired), right foot: Secondary | ICD-10-CM

## 2015-04-17 DIAGNOSIS — B351 Tinea unguium: Secondary | ICD-10-CM | POA: Diagnosis not present

## 2015-04-17 DIAGNOSIS — L97511 Non-pressure chronic ulcer of other part of right foot limited to breakdown of skin: Secondary | ICD-10-CM

## 2015-04-17 NOTE — Patient Instructions (Signed)
Follow up on right heel sore spot. Healing well. Return in 4 weeks.

## 2015-04-17 NOTE — Progress Notes (Signed)
Subjective:  75 year old male presents walking with a cane for follow up on right 5th digit ulcer and heel blister on right.   Objective:  Ulcerating corn 5th digit right has healed well. Right heel pressure blister has formed dry callus. Positive of excessive lower limb edema bilateral L>R.  All nails are hypertrophic. Positive of new wart like lesion, flat and keratotic with pinpoint bleeding under left foot plantar center about 1cm in diameter.   Assessment:  Heeled ulcer 5th right. Drying blister right heel.  Excess lower limb edema bilateral L>R DM peripheral neuropathy.  Hypertrophic nails. New wart like lesion left foot.  Plan:  Debrided dried ulcer on 5th toe right. Debrided dry blister right plantar posterior heel. Return in 4 weeks.

## 2015-04-26 ENCOUNTER — Other Ambulatory Visit: Payer: Medicare Other

## 2015-04-26 ENCOUNTER — Ambulatory Visit: Payer: Medicare Other | Admitting: Endocrinology

## 2015-04-30 DIAGNOSIS — W1849XA Other slipping, tripping and stumbling without falling, initial encounter: Secondary | ICD-10-CM | POA: Diagnosis not present

## 2015-04-30 DIAGNOSIS — R2681 Unsteadiness on feet: Secondary | ICD-10-CM | POA: Diagnosis not present

## 2015-04-30 DIAGNOSIS — E1142 Type 2 diabetes mellitus with diabetic polyneuropathy: Secondary | ICD-10-CM | POA: Diagnosis not present

## 2015-04-30 DIAGNOSIS — S0083XA Contusion of other part of head, initial encounter: Secondary | ICD-10-CM | POA: Diagnosis not present

## 2015-05-16 ENCOUNTER — Ambulatory Visit (INDEPENDENT_AMBULATORY_CARE_PROVIDER_SITE_OTHER): Payer: Medicare Other | Admitting: Podiatry

## 2015-05-16 ENCOUNTER — Encounter: Payer: Self-pay | Admitting: Podiatry

## 2015-05-16 DIAGNOSIS — M2041 Other hammer toe(s) (acquired), right foot: Secondary | ICD-10-CM

## 2015-05-16 DIAGNOSIS — L97511 Non-pressure chronic ulcer of other part of right foot limited to breakdown of skin: Secondary | ICD-10-CM | POA: Diagnosis not present

## 2015-05-16 NOTE — Patient Instructions (Signed)
Follow up on right little toe lesion. Doing well. Debrided and padded.

## 2015-05-16 NOTE — Progress Notes (Signed)
Subjective:  75 year old male presents walking with a cane for follow up on right 5th digit ulcer and heel blister on right.   Objective:  Ulcerating corn 5th digit right has healing well covered with callused tissue. Right heel pressure blister has formed dry callus. Positive of excessive lower limb edema bilateral L>R.  Positive of blister formation over old scarred are right shin, 1cm diameter.   Assessment:  Heeled ulcer 5th right. Drying blister right heel.  Excess lower limb edema bilateral L>R DM peripheral neuropathy.  Hypertrophic nails. New wart like lesion left foot.  Plan:  Debrided dried ulcer on 5th toe right. Debrided dry blister right plantar posterior heel. Return in 4 weeks.

## 2015-05-20 DIAGNOSIS — E1129 Type 2 diabetes mellitus with other diabetic kidney complication: Secondary | ICD-10-CM | POA: Diagnosis not present

## 2015-05-20 DIAGNOSIS — Z940 Kidney transplant status: Secondary | ICD-10-CM | POA: Diagnosis not present

## 2015-05-20 DIAGNOSIS — D899 Disorder involving the immune mechanism, unspecified: Secondary | ICD-10-CM | POA: Diagnosis not present

## 2015-05-20 DIAGNOSIS — N2581 Secondary hyperparathyroidism of renal origin: Secondary | ICD-10-CM | POA: Diagnosis not present

## 2015-05-27 DIAGNOSIS — Z7901 Long term (current) use of anticoagulants: Secondary | ICD-10-CM | POA: Diagnosis not present

## 2015-05-27 DIAGNOSIS — I4891 Unspecified atrial fibrillation: Secondary | ICD-10-CM | POA: Diagnosis not present

## 2015-05-30 ENCOUNTER — Other Ambulatory Visit: Payer: Self-pay | Admitting: Endocrinology

## 2015-06-06 ENCOUNTER — Encounter: Payer: Self-pay | Admitting: Endocrinology

## 2015-06-06 ENCOUNTER — Ambulatory Visit (INDEPENDENT_AMBULATORY_CARE_PROVIDER_SITE_OTHER): Payer: Medicare Other | Admitting: Endocrinology

## 2015-06-06 ENCOUNTER — Other Ambulatory Visit (INDEPENDENT_AMBULATORY_CARE_PROVIDER_SITE_OTHER): Payer: Medicare Other

## 2015-06-06 VITALS — BP 130/78 | HR 66 | Temp 98.2°F | Resp 16 | Ht 69.0 in | Wt 220.4 lb

## 2015-06-06 DIAGNOSIS — E785 Hyperlipidemia, unspecified: Secondary | ICD-10-CM

## 2015-06-06 DIAGNOSIS — E118 Type 2 diabetes mellitus with unspecified complications: Secondary | ICD-10-CM | POA: Diagnosis not present

## 2015-06-06 DIAGNOSIS — E1165 Type 2 diabetes mellitus with hyperglycemia: Secondary | ICD-10-CM | POA: Diagnosis not present

## 2015-06-06 DIAGNOSIS — Z794 Long term (current) use of insulin: Secondary | ICD-10-CM | POA: Diagnosis not present

## 2015-06-06 DIAGNOSIS — N183 Chronic kidney disease, stage 3 unspecified: Secondary | ICD-10-CM

## 2015-06-06 DIAGNOSIS — E039 Hypothyroidism, unspecified: Secondary | ICD-10-CM

## 2015-06-06 LAB — LIPID PANEL
CHOL/HDL RATIO: 8
Cholesterol: 233 mg/dL — ABNORMAL HIGH (ref 0–200)
HDL: 29.2 mg/dL — AB (ref >39.00)
NONHDL: 204.04
TRIGLYCERIDES: 397 mg/dL — AB (ref 0.0–149.0)
VLDL: 79.4 mg/dL — ABNORMAL HIGH (ref 0.0–40.0)

## 2015-06-06 LAB — COMPREHENSIVE METABOLIC PANEL
ALBUMIN: 3.3 g/dL — AB (ref 3.5–5.2)
ALT: 14 U/L (ref 0–53)
AST: 20 U/L (ref 0–37)
Alkaline Phosphatase: 40 U/L (ref 39–117)
BUN: 36 mg/dL — ABNORMAL HIGH (ref 6–23)
CALCIUM: 9.8 mg/dL (ref 8.4–10.5)
CHLORIDE: 93 meq/L — AB (ref 96–112)
CO2: 35 meq/L — AB (ref 19–32)
Creatinine, Ser: 2.15 mg/dL — ABNORMAL HIGH (ref 0.40–1.50)
GFR: 32 mL/min — AB (ref >60.00)
GLUCOSE: 363 mg/dL — AB (ref 70–99)
Potassium: 4.1 mEq/L (ref 3.5–5.1)
SODIUM: 134 meq/L — AB (ref 135–145)
Total Bilirubin: 1.1 mg/dL (ref 0.2–1.2)
Total Protein: 6.7 g/dL (ref 6.0–8.3)

## 2015-06-06 LAB — LDL CHOLESTEROL, DIRECT: LDL DIRECT: 119 mg/dL

## 2015-06-06 LAB — T4, FREE: Free T4: 1.35 ng/dL (ref 0.60–1.60)

## 2015-06-06 LAB — MICROALBUMIN / CREATININE URINE RATIO
Creatinine,U: 99.3 mg/dL
MICROALB UR: 88.1 mg/dL — AB (ref 0.0–1.9)
Microalb Creat Ratio: 88.7 mg/g — ABNORMAL HIGH (ref 0.0–30.0)

## 2015-06-06 LAB — POCT GLYCOSYLATED HEMOGLOBIN (HGB A1C): Hemoglobin A1C: 8.8

## 2015-06-06 LAB — TSH: TSH: 1.92 u[IU]/mL (ref 0.35–4.50)

## 2015-06-06 NOTE — Patient Instructions (Addendum)
Take u-500 insulin 30 min before each meal  New doses 60 in am, 40 before lunch and 60 before supper  More sugars at 11pm or so

## 2015-06-06 NOTE — Progress Notes (Signed)
Patient ID: Mark [name], male   DOB: March 19, 1941, 75 y.o.   MRN: [id number]   Reason for Appointment: Diabetes follow-up   History of Present Illness   Diagnosis: Type 2 DIABETES MELITUS, long-standing and insulin-dependent      Previous history: He has been on basal bolus insulin regimen for several years. Previously had been taking much larger doses of Lantus but is now requiring less since he had lost weight and had intercurrent illnesses He had done very well with Victoza in the past which had helped weight control and glucose but he had nausea and did not want to pay the high cost Because of his difficulty affording Lantus and Humalog insulin he was switched to NPH and Humalog in 5/15  Recent history:    Insulin regimen: Humulin R U-500: 50 at Bfst 50  lunch and at 55 supper     Because of the high cost of using Toujeo and Humalog and inability to get an insulin pump because of his high C-peptide level he was switched to U-500 insulin in 10/2014 His blood sugars have been gradually improving with this but still not controlled   His A1c has been consistently high since 7/14 and at the highest level in 8/16, now 8.8  Current blood sugar patterns and problems identified:  He has been checking blood sugars mostly in the mornings and only some around suppertime  He says he had a significant hypoglycemic episode around 5 PM once requiring assistance  Overall blood sugars are still high at waking up time but variable  He has a few readings over 400, some possibly related to rebound after hypoglycemia  He cannot explain why some mornings his blood sugars are as high as 315, and does not usually have a large snack at bedtime  He is not able to remember to take his insulin 30 minutes before eating consistently despite reminders to check blood sugars at various times  His fasting blood sugars have been somewhat variable but overall are better on an average compared to  last visit  Still eating out frequently and has variable carbohydrate fat intake.  Trying to avoid cereal at breakfast  He is not able to exercise  His weight fluctuates based on his fluid level  Noninsulin hypoglycemic drugs: none       Monitors blood glucose:  1-2 times a day.    Glucometer: Verio       Blood Glucose readings: Glucometer download reviewed:   Mean values apply above for all meters except median for One Touch  PRE-MEAL Fasting Lunch Dinner Bedtime Overall  Glucose range: 142-401  108-484 69-428  87    Mean/median:  209   115   208+/-100     Meals: 3 meals per day. for breakfast he will have frozen meat croissant; cereal 1-2/7 days;  sometimes outside meals may be high fat; eating out once a day  and trying to keep portions small .  Frequently has no snacks; having dinner 8 pm    Physical activity: exercise: None          Wt Readings from Last 3 Encounters:  06/06/15 220 lb 6.4 oz (99.973 kg)  01/24/15 231 lb 6.4 oz (104.962 kg)  11/23/14 224 lb 3.2 oz (101.696 kg)     Lab Results  Component Value Date   HGBA1C 8.8 06/06/2015   HGBA1C 9.4 11/26/2014   HGBA1C 9.3* 06/12/2014   Lab Results  Component Value Date   MICROALBUR  88.1* 06/06/2015   LDLCALC 88 09/27/2013   CREATININE 2.15* 06/06/2015        Medication List       This list is accurate as of: 06/06/15  9:50 PM.  Always use your most recent med list.               allopurinol 300 MG tablet  Commonly known as:  ZYLOPRIM  TAKE 1/2 OF A TABLET BY MOUTH ONCE DAILY.     BD PEN NEEDLE NANO U/F 32G X 4 MM Misc  Generic drug:  Insulin Pen Needle  USE AS DIRECTED 5 TIMES DAILY SUBQUE.     calcitRIOL 0.5 MCG capsule  Commonly known as:  ROCALTROL  Take one by mouth daily     DIGOX 0.125 MG tablet  Generic drug:  digoxin  Take one by mouth daily     fenofibrate micronized 134 MG capsule  Commonly known as:  LOFIBRA  TAKE ONE CAPSULE EVERY DAY WITH A MEAL     furosemide 80 MG tablet    Commonly known as:  LASIX  Take 2 tabs by mouth every am and 1 every pm     HYDROcodone-acetaminophen 5-325 MG tablet  Commonly known as:  NORCO/VICODIN  Take 1 by mouth every 4-6 hours as needed     insulin regular human CONCENTRATED 500 UNIT/ML kwikpen  Commonly known as:  HUMULIN R U-500 KWIKPEN  Inject 60 units three times a day     INSULIN SYRINGE .3CC/31GX5/16" 31G X 5/16" 0.3 ML Misc  Use as directed to inject insulin     ketoconazole 2 % shampoo  Commonly known as:  NIZORAL     levothyroxine 88 MCG tablet  Commonly known as:  SYNTHROID, LEVOTHROID  TAKE ONE TABLET BY MOUTH IN THE MORNING ON AN EMPTY STOMACH     metolazone 2.5 MG tablet  Commonly known as:  ZAROXOLYN     metoprolol succinate 50 MG 24 hr tablet  Commonly known as:  TOPROL-XL  Take one by mouth daily     mupirocin ointment 2 %  Commonly known as:  BACTROBAN     nystatin 100000 UNIT/GM Powd     omeprazole 20 MG capsule  Commonly known as:  PRILOSEC  Take 20 mg by mouth daily.     ONETOUCH DELICA LANCETS FINE Misc  Use to check blood sugar 4 times per day dx code 250.02     ONETOUCH VERIO test strip  Generic drug:  glucose blood  USE AS INSTRUCTED TO CHECK BLOOD SUGAR 4 TIMES A DAY     oxyCODONE-acetaminophen 7.5-325 MG tablet  Commonly known as:  PERCOCET  Take 1 tablet by mouth.     PHOSPHA 250 NEUTRAL 155-852-130 MG tablet  Generic drug:  phosphorus     potassium chloride SA 20 MEQ tablet  Commonly known as:  K-DUR,KLOR-CON  Take 20 mEq by mouth 2 (two) times daily. Takes 2 tablets in am and 3 in pm     predniSONE 5 MG tablet  Commonly known as:  DELTASONE  Take 5 mg by mouth daily.     PROGRAF 0.5 MG capsule  Generic drug:  tacrolimus  Take 0.5 mg by mouth 2 (two) times daily.     RAPAMUNE 0.5 MG Tabs  Generic drug:  Sirolimus  Take by mouth daily.     rosuvastatin 20 MG tablet  Commonly known as:  CRESTOR  Take 1 tablet daily     SSD 1 % cream  Generic drug:  silver  sulfADIAZINE  Apply to affected area twice a day     warfarin 5 MG tablet  Commonly known as:  COUMADIN  Take one by mouth daily        Allergies: No Known Allergies  Past Medical History  Diagnosis Date  . Diabetes ([place])   . Hypertension   . Gout   . Hypothyroidism   . Ulcer of other part of foot 12/15/2013    Past Surgical History  Procedure Laterality Date  . Melanoma excision    . Ankle fracture surgery    . Cataract extraction      No family history on file.  Social History:  reports that he has never smoked. He has never used smokeless tobacco. He reports that he does not drink alcohol or use illicit drugs.  Review of Systems   Cardiovascular ROS: positive for history of chronic atrial fibrillation  Edema is chronic, treated with with doses of Lasix (200mg  bid) and Zaroxlyn prn He does not like Elastic stockings as they are difficult to put on and edema is persistent  RENAL dysfunction: This is being followed  by his nephrologist Recent level as follows And stable   Lab Results  Component Value Date   CREATININE 2.15* 06/06/2015    Mild hypothyroidism, stable on 88 mcg levothyroxine; TSH last normal; compliant with supplement. Overall feels fairly good  Lab Results  Component Value Date   TSH 1.92 06/06/2015   HYPERLIPIDEMIA: Even with taking full doses of fenofibrate 3/7 days a week Previously compliant with Crestor but he stopped this because of the high cost Of the brand name drug and his LDL is still high His triglycerides are high fairly consistently   Previously had intolerance to niacin  His diet is periodically  high in fat if eating out  Lab Results  Component Value Date   CHOL 233* 06/06/2015   HDL 29.20* 06/06/2015   LDLCALC 88 09/27/2013   LDLDIRECT 119.0 06/06/2015   TRIG 397.0* 06/06/2015   CHOLHDL 8 06/06/2015    NEUROPATHY: He has significant numbness in feet, no significant discomfort or paresthesia.  He was taken off  gabapentin because of some balance issues .     Examination:   BP 130/78 mmHg  Pulse 66  Temp(Src) 98.2 F (36.8 C)  Resp 16  Ht 5\' 9"  (1.753 m)  Wt 220 lb 6.4 oz (99.973 kg)  BMI 32.53 kg/m2  SpO2 97%  Body mass index is 32.53 kg/(m^2).      Assesment/ PLAN:  DIABETES:  See history of present illness for detailed discussion of his current blood sugar patterns, insulin regimen and problems identified Although his blood sugars are somewhat better with using the U-500 insulin he has marked variability He is taking single type of insulin to reduce his out of pocket expense Some of his high postprandial reading may be related to not taking the insulin 30 meets before eating Also difficult to adjust his evening dose as he usually does not check readings after supper  Most likely needs less insulin around lunchtime because of tendency to normal or low sugars before supper Fasting blood sugars are difficult to control with variability  He has variability in his blood sugars in the mornings and not clear if this is related to his diet the night before He forgets to check his blood sugars after supper Difficult to adjust his mealtime doses as he usually does not do postprandial reading However appears to  have increase in his blood sugar after breakfast possibly from taking prednisone in the morning  Also tends to have relatively low blood sugars at suppertime He has difficulty following healthy balanced diet because of  eating out frequently or eating frozen foods  For now will change his insulin doses to 60--40--60 He will do more readings after meals especially supper and follow-up in 4 weeks for reassessment Discussed basics of using the V-go pump and he will consider this on the next visit  HYPERTENSION: Controlled, This is followed by nephrologist and cardiologist    HYPERCHOLESTEROLEMIA/high triglycerides.  He still has not started back on his Generic Crestor and will send a  prescription for 5 mg to start  BALANCE difficulties: Still not improving and has some leg weakness.  Recommended that he discussed neurology consultation with his PCP   Patient Instructions  Take u-500 insulin 30 min before each meal  New doses 60 in am, 40 before lunch and 60 before supper  More sugars at 11pm or so      Counseling time on subjects discussed above is over 50% of today's 25 minute visit  Keeley Sussman 06/06/2015, 9:50 PM

## 2015-06-07 ENCOUNTER — Other Ambulatory Visit: Payer: Self-pay | Admitting: *Deleted

## 2015-06-07 MED ORDER — ROSUVASTATIN CALCIUM 5 MG PO TABS: 5.0000 mg | ORAL_TABLET | Freq: Every day | ORAL | Status: DC

## 2015-06-07 NOTE — Progress Notes (Signed)
Quick Note:  Please let patient know that the cholesterol is high, thyroid normal, creatinine 2.15. Need prescription for 5 mg Crestor Generic ______

## 2015-06-11 ENCOUNTER — Telehealth: Payer: Self-pay | Admitting: Endocrinology

## 2015-06-11 NOTE — Telephone Encounter (Signed)
[employer] customer care calling to let us know the medical exception is needed for the [employer]-they faxed a form on 06/07/15 did we receive this [phone] opt 2 ext [phone]

## 2015-06-12 DIAGNOSIS — E1142 Type 2 diabetes mellitus with diabetic polyneuropathy: Secondary | ICD-10-CM | POA: Diagnosis not present

## 2015-06-12 DIAGNOSIS — R2681 Unsteadiness on feet: Secondary | ICD-10-CM | POA: Diagnosis not present

## 2015-06-12 DIAGNOSIS — W1849XA Other slipping, tripping and stumbling without falling, initial encounter: Secondary | ICD-10-CM | POA: Diagnosis not present

## 2015-06-12 NOTE — Telephone Encounter (Signed)
Yes we received it, it was filled out and faxecd back to his insurance company.

## 2015-06-13 ENCOUNTER — Encounter: Payer: Self-pay | Admitting: Podiatry

## 2015-06-13 ENCOUNTER — Telehealth: Payer: Self-pay | Admitting: Endocrinology

## 2015-06-13 ENCOUNTER — Ambulatory Visit (INDEPENDENT_AMBULATORY_CARE_PROVIDER_SITE_OTHER): Payer: Medicare Other | Admitting: Podiatry

## 2015-06-13 DIAGNOSIS — B351 Tinea unguium: Secondary | ICD-10-CM

## 2015-06-13 DIAGNOSIS — M79606 Pain in leg, unspecified: Secondary | ICD-10-CM | POA: Diagnosis not present

## 2015-06-13 NOTE — Progress Notes (Signed)
Subjective:  75 year old male presents walking with a cane for follow up on right 5th digit ulcer and heel blister on right.  Nails are thick and long, bothers him to walk.  Objective:  Ulcerating corn 5th digit right has healing well covered with callused tissue. All nails are thick and hypertrophic. Positive of excessive lower limb edema bilateral L>R.  Positive of blister formation over old scarred are right shin, 1cm diameter.   Assessment:  Heeled ulcer 5th right. Drying blister right heel.  Excess lower limb edema bilateral L>R DM peripheral neuropathy.  Onychomycosis x 10. New wart like lesion left foot.  Plan:  Debrided dried pre ulcerative corn and padded 5th toe right. All nails debrided. Return in 4 weeks.

## 2015-06-13 NOTE — Telephone Encounter (Signed)
Mark [name] was calling to follow up and see if a medical exception form has been received yet and would like a call to confirm this. CB# [phone] Option 2 EXT [phone]

## 2015-06-13 NOTE — Patient Instructions (Signed)
Seen for hypertrophic nails and check on 5th toe right. Toe healed well.. All nails debrided. Return in one month to check on 5th toe right.

## 2015-06-17 DIAGNOSIS — Z87891 Personal history of nicotine dependence: Secondary | ICD-10-CM | POA: Diagnosis not present

## 2015-06-17 DIAGNOSIS — E1142 Type 2 diabetes mellitus with diabetic polyneuropathy: Secondary | ICD-10-CM | POA: Diagnosis not present

## 2015-06-17 DIAGNOSIS — Z602 Problems related to living alone: Secondary | ICD-10-CM | POA: Diagnosis not present

## 2015-06-17 DIAGNOSIS — Z7901 Long term (current) use of anticoagulants: Secondary | ICD-10-CM | POA: Diagnosis not present

## 2015-06-17 DIAGNOSIS — I4891 Unspecified atrial fibrillation: Secondary | ICD-10-CM | POA: Diagnosis not present

## 2015-06-17 DIAGNOSIS — R296 Repeated falls: Secondary | ICD-10-CM | POA: Diagnosis not present

## 2015-06-17 DIAGNOSIS — Z9181 History of falling: Secondary | ICD-10-CM | POA: Diagnosis not present

## 2015-06-17 DIAGNOSIS — Z794 Long term (current) use of insulin: Secondary | ICD-10-CM | POA: Diagnosis not present

## 2015-06-17 DIAGNOSIS — I129 Hypertensive chronic kidney disease with stage 1 through stage 4 chronic kidney disease, or unspecified chronic kidney disease: Secondary | ICD-10-CM | POA: Diagnosis not present

## 2015-06-17 DIAGNOSIS — Z7952 Long term (current) use of systemic steroids: Secondary | ICD-10-CM | POA: Diagnosis not present

## 2015-06-17 DIAGNOSIS — Z905 Acquired absence of kidney: Secondary | ICD-10-CM | POA: Diagnosis not present

## 2015-06-17 DIAGNOSIS — N189 Chronic kidney disease, unspecified: Secondary | ICD-10-CM | POA: Diagnosis not present

## 2015-06-17 DIAGNOSIS — R2681 Unsteadiness on feet: Secondary | ICD-10-CM | POA: Diagnosis not present

## 2015-06-19 DIAGNOSIS — I129 Hypertensive chronic kidney disease with stage 1 through stage 4 chronic kidney disease, or unspecified chronic kidney disease: Secondary | ICD-10-CM | POA: Diagnosis not present

## 2015-06-19 DIAGNOSIS — N189 Chronic kidney disease, unspecified: Secondary | ICD-10-CM | POA: Diagnosis not present

## 2015-06-19 DIAGNOSIS — R2681 Unsteadiness on feet: Secondary | ICD-10-CM | POA: Diagnosis not present

## 2015-06-19 DIAGNOSIS — R296 Repeated falls: Secondary | ICD-10-CM | POA: Diagnosis not present

## 2015-06-19 DIAGNOSIS — E1142 Type 2 diabetes mellitus with diabetic polyneuropathy: Secondary | ICD-10-CM | POA: Diagnosis not present

## 2015-06-19 DIAGNOSIS — I4891 Unspecified atrial fibrillation: Secondary | ICD-10-CM | POA: Diagnosis not present

## 2015-06-24 DIAGNOSIS — Z7901 Long term (current) use of anticoagulants: Secondary | ICD-10-CM | POA: Diagnosis not present

## 2015-06-24 DIAGNOSIS — I4891 Unspecified atrial fibrillation: Secondary | ICD-10-CM | POA: Diagnosis not present

## 2015-06-25 DIAGNOSIS — N189 Chronic kidney disease, unspecified: Secondary | ICD-10-CM | POA: Diagnosis not present

## 2015-06-25 DIAGNOSIS — I129 Hypertensive chronic kidney disease with stage 1 through stage 4 chronic kidney disease, or unspecified chronic kidney disease: Secondary | ICD-10-CM | POA: Diagnosis not present

## 2015-06-25 DIAGNOSIS — E1142 Type 2 diabetes mellitus with diabetic polyneuropathy: Secondary | ICD-10-CM | POA: Diagnosis not present

## 2015-06-25 DIAGNOSIS — R2681 Unsteadiness on feet: Secondary | ICD-10-CM | POA: Diagnosis not present

## 2015-06-25 DIAGNOSIS — R296 Repeated falls: Secondary | ICD-10-CM | POA: Diagnosis not present

## 2015-06-25 DIAGNOSIS — I4891 Unspecified atrial fibrillation: Secondary | ICD-10-CM | POA: Diagnosis not present

## 2015-06-26 DIAGNOSIS — Z940 Kidney transplant status: Secondary | ICD-10-CM | POA: Diagnosis not present

## 2015-06-27 DIAGNOSIS — R2681 Unsteadiness on feet: Secondary | ICD-10-CM | POA: Diagnosis not present

## 2015-06-27 DIAGNOSIS — I129 Hypertensive chronic kidney disease with stage 1 through stage 4 chronic kidney disease, or unspecified chronic kidney disease: Secondary | ICD-10-CM | POA: Diagnosis not present

## 2015-06-27 DIAGNOSIS — N189 Chronic kidney disease, unspecified: Secondary | ICD-10-CM | POA: Diagnosis not present

## 2015-06-27 DIAGNOSIS — E1142 Type 2 diabetes mellitus with diabetic polyneuropathy: Secondary | ICD-10-CM | POA: Diagnosis not present

## 2015-06-27 DIAGNOSIS — I4891 Unspecified atrial fibrillation: Secondary | ICD-10-CM | POA: Diagnosis not present

## 2015-06-27 DIAGNOSIS — R296 Repeated falls: Secondary | ICD-10-CM | POA: Diagnosis not present

## 2015-07-02 DIAGNOSIS — I129 Hypertensive chronic kidney disease with stage 1 through stage 4 chronic kidney disease, or unspecified chronic kidney disease: Secondary | ICD-10-CM | POA: Diagnosis not present

## 2015-07-02 DIAGNOSIS — R2681 Unsteadiness on feet: Secondary | ICD-10-CM | POA: Diagnosis not present

## 2015-07-02 DIAGNOSIS — E1142 Type 2 diabetes mellitus with diabetic polyneuropathy: Secondary | ICD-10-CM | POA: Diagnosis not present

## 2015-07-02 DIAGNOSIS — N189 Chronic kidney disease, unspecified: Secondary | ICD-10-CM | POA: Diagnosis not present

## 2015-07-02 DIAGNOSIS — I4891 Unspecified atrial fibrillation: Secondary | ICD-10-CM | POA: Diagnosis not present

## 2015-07-02 DIAGNOSIS — R296 Repeated falls: Secondary | ICD-10-CM | POA: Diagnosis not present

## 2015-07-04 ENCOUNTER — Other Ambulatory Visit: Payer: Self-pay | Admitting: Endocrinology

## 2015-07-04 ENCOUNTER — Ambulatory Visit (INDEPENDENT_AMBULATORY_CARE_PROVIDER_SITE_OTHER): Payer: Medicare Other | Admitting: Endocrinology

## 2015-07-04 ENCOUNTER — Encounter: Payer: Self-pay | Admitting: Endocrinology

## 2015-07-04 VITALS — BP 120/80 | HR 62 | Temp 97.5°F | Resp 14 | Ht 69.0 in | Wt 223.6 lb

## 2015-07-04 DIAGNOSIS — E785 Hyperlipidemia, unspecified: Secondary | ICD-10-CM | POA: Diagnosis not present

## 2015-07-04 DIAGNOSIS — E1165 Type 2 diabetes mellitus with hyperglycemia: Secondary | ICD-10-CM

## 2015-07-04 DIAGNOSIS — E1142 Type 2 diabetes mellitus with diabetic polyneuropathy: Secondary | ICD-10-CM | POA: Diagnosis not present

## 2015-07-04 DIAGNOSIS — R296 Repeated falls: Secondary | ICD-10-CM | POA: Diagnosis not present

## 2015-07-04 DIAGNOSIS — R2681 Unsteadiness on feet: Secondary | ICD-10-CM | POA: Diagnosis not present

## 2015-07-04 DIAGNOSIS — N189 Chronic kidney disease, unspecified: Secondary | ICD-10-CM | POA: Diagnosis not present

## 2015-07-04 DIAGNOSIS — I129 Hypertensive chronic kidney disease with stage 1 through stage 4 chronic kidney disease, or unspecified chronic kidney disease: Secondary | ICD-10-CM | POA: Diagnosis not present

## 2015-07-04 DIAGNOSIS — Z794 Long term (current) use of insulin: Secondary | ICD-10-CM | POA: Diagnosis not present

## 2015-07-04 DIAGNOSIS — I4891 Unspecified atrial fibrillation: Secondary | ICD-10-CM | POA: Diagnosis not present

## 2015-07-04 NOTE — Progress Notes (Signed)
Patient ID: Mark [name], male   DOB: [birth year], 75 y.o.   MRN: [id number]   Reason for Appointment: Diabetes follow-up   History of Present Illness   Diagnosis: Type 2 DIABETES MELITUS, long-standing and insulin-dependent      Previous history: He has been on basal bolus insulin regimen for several years. Previously had been taking much larger doses of Lantus but is now requiring less since he had lost weight and had intercurrent illnesses He had done very well with Victoza in the past which had helped weight control and glucose but he had nausea and did not want to pay the high cost Because of his difficulty affording Lantus and Humalog insulin he was switched to NPH and Humalog in 5/15  Recent history:    Insulin regimen: Humulin R U-500: 60 at Bfst 40  lunch and at 60 supper     Because of the high cost of using Toujeo and Humalog and inability to get an insulin pump because of his high C-peptide level he was switched to U-500 insulin in 10/2014 His last visit he was advised to get the V-go pump but he is still unsure whether it is affordable  His A1c has been consistently high since 7/14 and at the highest level in 8/16, last 8.8  Current blood sugar patterns and problems identified:  He has been checking blood sugars mostly in the mornings again despite reminders about reading postprandial fluid glucose   Blood sugars are the lowest BEFORE suppertime but not consistent, recently only one low blood sugar  He has 3 readings AFTER evening meal, 2 of them are significantly higher in the other 189  FASTING blood sugars are still about the same as the last visit despite increasing his suppertime coverage  However he is getting occasional overnight hypoglycemia, lowest documented reading 64  He is still eating HIGH-fat meals including breakfast which is frozen and last night had a hamburger and french fries  Still eating out frequently and has variable carbohydrate  fat intake.  Trying to avoid cereal at breakfast  He is not able to exercise  His weight fluctuates based on his fluid level  Noninsulin hypoglycemic drugs: none       Monitors blood glucose:  1-2 times a day.    Glucometer: Verio       Blood Glucose readings: Glucometer download reviewed:   Mean values apply above for all meters except median for One Touch  PRE-MEAL Fasting Lunch Dinner Bedtime Overall  Glucose range: 100-298  213-259  67-369  189-369    Mean/median: 219  227  200   217    OVERNIGHT: 64-216  Meals: 3 meals per day. for breakfast he will have frozen meat croissant; cereal 1-2/7 days;  sometimes outside meals may be high fat; eating out once a day  and trying to keep portions small .  Frequently has no snacks; having dinner 8 pm    Physical activity: exercise: None          Wt Readings from Last 3 Encounters:  07/04/15 223 lb 9.6 oz (101.424 kg)  06/06/15 220 lb 6.4 oz (99.973 kg)  01/24/15 231 lb 6.4 oz (104.962 kg)     Lab Results  Component Value Date   HGBA1C 8.8 06/06/2015   HGBA1C 9.4 11/26/2014   HGBA1C 9.3* 06/12/2014   Lab Results  Component Value Date   MICROALBUR 88.1* 06/06/2015   LDLCALC 88 09/27/2013   CREATININE 2.15* 06/06/2015  Medication List       This list is accurate as of: 07/04/15 11:59 PM.  Always use your most recent med list.               allopurinol 300 MG tablet  Commonly known as:  ZYLOPRIM  TAKE 1/2 OF A TABLET BY MOUTH ONCE DAILY.     BD PEN NEEDLE NANO U/F 32G X 4 MM Misc  Generic drug:  Insulin Pen Needle  USE AS DIRECTED 5 TIMES DAILY SUBQUE.     calcitRIOL 0.5 MCG capsule  Commonly known as:  ROCALTROL  Take one by mouth daily     DIGOX 0.125 MG tablet  Generic drug:  digoxin  Take one by mouth daily     fenofibrate micronized 134 MG capsule  Commonly known as:  LOFIBRA  TAKE ONE CAPSULE EVERY DAY WITH A MEAL     furosemide 80 MG tablet  Commonly known as:  LASIX  Take 2 tabs by mouth  every am and 1 every pm     HYDROcodone-acetaminophen 5-325 MG tablet  Commonly known as:  NORCO/VICODIN  Take 1 by mouth every 4-6 hours as needed     insulin regular human CONCENTRATED 500 UNIT/ML kwikpen  Commonly known as:  HUMULIN R U-500 KWIKPEN  Inject 60 units three times a day     INSULIN SYRINGE .3CC/31GX5/16" 31G X 5/16" 0.3 ML Misc  Use as directed to inject insulin     ketoconazole 2 % shampoo  Commonly known as:  NIZORAL     levothyroxine 88 MCG tablet  Commonly known as:  SYNTHROID, LEVOTHROID  TAKE ONE TABLET BY MOUTH IN THE MORNING ON AN EMPTY STOMACH     metolazone 2.5 MG tablet  Commonly known as:  ZAROXOLYN     metoprolol succinate 50 MG 24 hr tablet  Commonly known as:  TOPROL-XL  Take one by mouth daily     mupirocin ointment 2 %  Commonly known as:  BACTROBAN     nystatin 100000 UNIT/GM Powd     omeprazole 20 MG capsule  Commonly known as:  PRILOSEC  Take 20 mg by mouth daily.     ONETOUCH DELICA LANCETS FINE Misc  Use to check blood sugar 4 times per day dx code 250.02     ONETOUCH VERIO test strip  Generic drug:  glucose blood  USE AS INSTRUCTED TO CHECK BLOOD SUGAR 4 TIMES A DAY     oxyCODONE-acetaminophen 7.5-325 MG tablet  Commonly known as:  PERCOCET  Take 1 tablet by mouth.     PHOSPHA 250 NEUTRAL 155-852-130 MG tablet  Generic drug:  phosphorus     potassium chloride SA 20 MEQ tablet  Commonly known as:  K-DUR,KLOR-CON  Take 20 mEq by mouth 2 (two) times daily. Takes 2 tablets in am and 3 in pm     predniSONE 5 MG tablet  Commonly known as:  DELTASONE  Take 5 mg by mouth daily.     PROGRAF 0.5 MG capsule  Generic drug:  tacrolimus  Take 0.5 mg by mouth 2 (two) times daily.     RAPAMUNE 0.5 MG Tabs  Generic drug:  Sirolimus  Take by mouth daily.     rosuvastatin 5 MG tablet  Commonly known as:  CRESTOR  Take 1 tablet (5 mg total) by mouth daily.     SSD 1 % cream  Generic drug:  silver sulfADIAZINE  Apply to  affected area twice a day  warfarin 5 MG tablet  Commonly known as:  COUMADIN  Take one by mouth daily        Allergies: No Known Allergies  Past Medical History  Diagnosis Date  . Diabetes ([place])   . Hypertension   . Gout   . Hypothyroidism   . Ulcer of other part of foot 12/15/2013    Past Surgical History  Procedure Laterality Date  . Melanoma excision    . Ankle fracture surgery    . Cataract extraction      No family history on file.  Social History:  reports that he has never smoked. He has never used smokeless tobacco. He reports that he does not drink alcohol or use illicit drugs.  Review of Systems   Has a history of chronic atrial fibrillation  Edema is chronic, treated with with doses of Lasix (200mg  bid) and Zaroxlyn prn He does not like Elastic stockings as they are difficult to put on and edema is persistent  RENAL dysfunction: This is being followed  by his nephrologist Recent level as follows And stable   Lab Results  Component Value Date   CREATININE 2.15* 06/06/2015    Mild hypothyroidism, stable on 88 mcg levothyroxine; TSH last normal; compliant with supplement. Overall feels fairly good  Lab Results  Component Value Date   TSH 1.92 06/06/2015   HYPERLIPIDEMIA: Even with taking full doses of fenofibrate 3/7 days a week His triglycerides are high fairly consistently  Crestor prescription was sent on the last visit  Previously had intolerance to niacin  His diet is periodically  high in fat if eating out  Lab Results  Component Value Date   CHOL 233* 06/06/2015   HDL 29.20* 06/06/2015   LDLCALC 88 09/27/2013   LDLDIRECT 119.0 06/06/2015   TRIG 397.0* 06/06/2015   CHOLHDL 8 06/06/2015    NEUROPATHY: He has significant numbness in feet, no significant discomfort or paresthesia.  He was taken off gabapentin because of some balance issues .     Examination:   BP 120/80 mmHg  Pulse 62  Temp(Src) 97.5 F (36.4 C)  Resp 14   Ht 5\' 9"  (1.753 m)  Wt 223 lb 9.6 oz (101.424 kg)  BMI 33.00 kg/m2  SpO2 98%  Body mass index is 33 kg/(m^2).      Assesment/ PLAN:  DIABETES:  See history of present illness for detailed discussion of his current blood sugar patterns, insulin regimen and problems identified Although his blood sugars in the mornings were improving he still has significant higher readings more recently and on an average not significantly better Blood sugars are usually high after breakfast and evening meal from the limited information available Most likely his blood sugars are difficult to control because of his high fat meals frequently He is eating like meals and sometimes minimal amounts at lunch and blood sugars are frequently low normal at suppertime  Discussed attempts to improve his diet with reducing fat intake consistently both at home and eating out Discussed basics of using the V-go pump and he will be scheduled to do this, he can call the company again to verify his out of pocket expense Meanwhile will reduce her suppertime coverage by 5 units He can adjust his lunchtime dose up or down 5 units based on his meal size and carbohydrate intake Low-fat options such as an egg and toast at breakfast  Fasting readings if they are consistently high and if he is not able to get the V-go  pump may need to consider adding Toujeo again  HYPERTENSION: Controlled, This is followed by nephrologist and cardiologist    HYPERCHOLESTEROLEMIA/high triglycerides.  Will check lipids on the next visit, has started Crestor    Patient Instructions  Reduce supper dose to 55 units  Leave off high fat meals, avoid meals over 10g fat and fried food      Counseling time on subjects discussed above is over 50% of today's 25 minute visit  Mark [name] 07/05/2015, 8:18 AM

## 2015-07-04 NOTE — Patient Instructions (Addendum)
Reduce supper dose to 55 units  Leave off high fat meals, avoid meals over 10g fat and fried food

## 2015-07-08 ENCOUNTER — Telehealth: Payer: Self-pay | Admitting: Endocrinology

## 2015-07-08 DIAGNOSIS — R2681 Unsteadiness on feet: Secondary | ICD-10-CM | POA: Diagnosis not present

## 2015-07-08 DIAGNOSIS — R296 Repeated falls: Secondary | ICD-10-CM | POA: Diagnosis not present

## 2015-07-08 DIAGNOSIS — I4891 Unspecified atrial fibrillation: Secondary | ICD-10-CM | POA: Diagnosis not present

## 2015-07-08 DIAGNOSIS — N189 Chronic kidney disease, unspecified: Secondary | ICD-10-CM | POA: Diagnosis not present

## 2015-07-08 DIAGNOSIS — E1142 Type 2 diabetes mellitus with diabetic polyneuropathy: Secondary | ICD-10-CM | POA: Diagnosis not present

## 2015-07-08 DIAGNOSIS — I129 Hypertensive chronic kidney disease with stage 1 through stage 4 chronic kidney disease, or unspecified chronic kidney disease: Secondary | ICD-10-CM | POA: Diagnosis not present

## 2015-07-08 NOTE — Telephone Encounter (Signed)
Patient stated that insulin pump has been approved, what is the next step, please advise

## 2015-07-09 ENCOUNTER — Other Ambulatory Visit: Payer: Self-pay | Admitting: *Deleted

## 2015-07-09 MED ORDER — INSULIN REGULAR HUMAN (CONC) 500 UNIT/ML ~~LOC~~ SOLN: SUBCUTANEOUS | Status: DC

## 2015-07-09 MED ORDER — V-GO 20 KIT: PACK | Status: DC

## 2015-07-09 NOTE — Telephone Encounter (Signed)
Message left for patient to return call, per his insurance company since he's using his insulin with the V-go it has to be filed under part B.  He has to use either a [facility] or [employer].

## 2015-07-09 NOTE — Telephone Encounter (Signed)
I spoke with Mr. Phegley, he gave me the [facility] in [place] to call his Humulin U-500 into.

## 2015-07-09 NOTE — Telephone Encounter (Signed)
Patient is returning your call.  

## 2015-07-09 NOTE — Telephone Encounter (Signed)
Humulin has been denied because it is not covered under part d but should be covered under part b, check with pharmacy about this if needed [phone]

## 2015-07-11 ENCOUNTER — Other Ambulatory Visit: Payer: Self-pay | Admitting: Endocrinology

## 2015-07-11 ENCOUNTER — Ambulatory Visit (INDEPENDENT_AMBULATORY_CARE_PROVIDER_SITE_OTHER): Payer: Medicare Other | Admitting: Podiatry

## 2015-07-11 ENCOUNTER — Encounter: Payer: Self-pay | Admitting: Podiatry

## 2015-07-11 DIAGNOSIS — L89611 Pressure ulcer of right heel, stage 1: Secondary | ICD-10-CM | POA: Diagnosis not present

## 2015-07-11 DIAGNOSIS — L97511 Non-pressure chronic ulcer of other part of right foot limited to breakdown of skin: Secondary | ICD-10-CM

## 2015-07-11 DIAGNOSIS — R600 Localized edema: Secondary | ICD-10-CM

## 2015-07-11 DIAGNOSIS — E1142 Type 2 diabetes mellitus with diabetic polyneuropathy: Secondary | ICD-10-CM

## 2015-07-11 NOTE — Patient Instructions (Signed)
Seen for right heel ulcer and 5th toe lesion. No active drainage. Keep dressing on right heel and use pillow in bed to elevated right foot. Return in one month.

## 2015-07-11 NOTE — Progress Notes (Signed)
Subjective:  75 year old male presents walking with a cane for follow up on right 5th digit ulcer and heel blister on right. He was accompanied by his helper. Stated that he stepped on a broken shoe horn and hurt the right heel where he has a dark scab wound from old blister.  He has Telfa gauze covered on the right heel.   Objective:  Ulcerating corn 5th digit right covered with callused tissue. Black hard scab has broken and exposed ulcerated base on right heel. Positive of excessive lower limb edema bilateral L>R.  Pedal pulses are not palpable bilateral.   Assessment:  Heeled ulcer 5th right with open base from recent incident. Excess lower limb edema bilateral L>R DM peripheral neuropathy.  Pre ulcerative digital corn 5th distal lateral right. New wart like lesion left foot.  Plan:  Debrided dried pre ulcerative corn and padded 5th toe right. Amerigel dressing applied to right heel with home care instruction to keep the heel off of any pressure. Advised to keep compression socks daily. Discussed with his helper.  Return in 4 weeks.

## 2015-07-12 ENCOUNTER — Telehealth: Payer: Self-pay | Admitting: Endocrinology

## 2015-07-12 DIAGNOSIS — I129 Hypertensive chronic kidney disease with stage 1 through stage 4 chronic kidney disease, or unspecified chronic kidney disease: Secondary | ICD-10-CM | POA: Diagnosis not present

## 2015-07-12 DIAGNOSIS — N189 Chronic kidney disease, unspecified: Secondary | ICD-10-CM | POA: Diagnosis not present

## 2015-07-12 DIAGNOSIS — I4891 Unspecified atrial fibrillation: Secondary | ICD-10-CM | POA: Diagnosis not present

## 2015-07-12 DIAGNOSIS — E1142 Type 2 diabetes mellitus with diabetic polyneuropathy: Secondary | ICD-10-CM | POA: Diagnosis not present

## 2015-07-12 DIAGNOSIS — R2681 Unsteadiness on feet: Secondary | ICD-10-CM | POA: Diagnosis not present

## 2015-07-12 DIAGNOSIS — R296 Repeated falls: Secondary | ICD-10-CM | POA: Diagnosis not present

## 2015-07-12 NOTE — Telephone Encounter (Signed)
Pharmacy needs humulin r needs a diagnosis code and the coupon for humulin r qwikpen can we send in rx for this so he can use the coupon

## 2015-07-15 ENCOUNTER — Other Ambulatory Visit: Payer: Self-pay | Admitting: *Deleted

## 2015-07-15 ENCOUNTER — Telehealth: Payer: Self-pay | Admitting: Endocrinology

## 2015-07-15 MED ORDER — INSULIN REGULAR HUMAN (CONC) 500 UNIT/ML ~~LOC~~ SOLN: SUBCUTANEOUS | Status: DC

## 2015-07-15 NOTE — Telephone Encounter (Signed)
E11.65

## 2015-07-15 NOTE — Telephone Encounter (Signed)
Megan from [facility] in [facility] call stated Patient need a Dx code for insulin Humulin R, patient is at the pharmacy as we speak.

## 2015-07-16 ENCOUNTER — Encounter: Payer: Medicare Other | Attending: Endocrinology | Admitting: Nutrition

## 2015-07-16 DIAGNOSIS — Z794 Long term (current) use of insulin: Secondary | ICD-10-CM | POA: Diagnosis not present

## 2015-07-16 DIAGNOSIS — E1165 Type 2 diabetes mellitus with hyperglycemia: Secondary | ICD-10-CM | POA: Insufficient documentation

## 2015-07-16 DIAGNOSIS — E1142 Type 2 diabetes mellitus with diabetic polyneuropathy: Secondary | ICD-10-CM | POA: Diagnosis not present

## 2015-07-16 DIAGNOSIS — E1065 Type 1 diabetes mellitus with hyperglycemia: Secondary | ICD-10-CM

## 2015-07-16 DIAGNOSIS — R2681 Unsteadiness on feet: Secondary | ICD-10-CM | POA: Diagnosis not present

## 2015-07-16 DIAGNOSIS — R296 Repeated falls: Secondary | ICD-10-CM | POA: Diagnosis not present

## 2015-07-16 DIAGNOSIS — I129 Hypertensive chronic kidney disease with stage 1 through stage 4 chronic kidney disease, or unspecified chronic kidney disease: Secondary | ICD-10-CM | POA: Diagnosis not present

## 2015-07-16 DIAGNOSIS — I4891 Unspecified atrial fibrillation: Secondary | ICD-10-CM | POA: Diagnosis not present

## 2015-07-16 DIAGNOSIS — N189 Chronic kidney disease, unspecified: Secondary | ICD-10-CM | POA: Diagnosis not present

## 2015-07-17 ENCOUNTER — Other Ambulatory Visit: Payer: Self-pay | Admitting: Endocrinology

## 2015-07-17 DIAGNOSIS — Z794 Long term (current) use of insulin: Principal | ICD-10-CM

## 2015-07-17 DIAGNOSIS — E1165 Type 2 diabetes mellitus with hyperglycemia: Secondary | ICD-10-CM

## 2015-07-17 NOTE — Patient Instructions (Addendum)
Apply a new V-go every day at the same time. Test blood sugars before meals and at bedtime. Call readings to office on Thursday.

## 2015-07-17 NOTE — Progress Notes (Signed)
Patient and his niece were instructed on how to fill, apply and use the V-go.  He filled a V-Go 20 with U500 R insulin via a syringe that was filled with 58u of R U500.  He was shown how to give the meal time boluses, and written instructions were given for 5 clicks 30 min. Before breakfast and supper, and 4 clicks before lunch.   He filled the V-go with little assistance from me and applied it to his left upper abdomen with no difficulty.  He was given a V-go 20 starter kit with directions for use, and a 800 telephone number to call for assitance. He was given a sheet to record his blood sugars and told to test ac and HS on Thursday.  He agreed to do this, and had no final questions.

## 2015-07-18 ENCOUNTER — Telehealth: Payer: Self-pay | Admitting: Endocrinology

## 2015-07-18 NOTE — Telephone Encounter (Signed)
PT requests call back from you he said he needs to talk to you about his meter

## 2015-07-18 NOTE — Telephone Encounter (Signed)
Noted, he is aware

## 2015-07-18 NOTE — Telephone Encounter (Signed)
His sugars are getting too low.  We will need to stop the V-go pump with the U-500 insulin Would like him to go back to the injections until next week and then try the V-go pump 40 unit basal and change insulin to the Novolin R U-100 as a trial.  He can pick up a sample if he wants and see me at the end of next week

## 2015-07-18 NOTE — Telephone Encounter (Signed)
Patient called with his Glucose readings after starting the V-Go  Tuesday- 4:30 pm-177  1:30 am-121 Wednesday-9 am-54, 2 pm-123  2:30 am-64 Thursday 4:00 am-80  9 am 123  Please advise.

## 2015-07-22 ENCOUNTER — Other Ambulatory Visit: Payer: Self-pay | Admitting: *Deleted

## 2015-07-22 MED ORDER — V-GO 40 KIT: PACK | Status: DC

## 2015-07-25 DIAGNOSIS — Z6833 Body mass index (BMI) 33.0-33.9, adult: Secondary | ICD-10-CM | POA: Diagnosis not present

## 2015-07-25 DIAGNOSIS — Z79899 Other long term (current) drug therapy: Secondary | ICD-10-CM | POA: Diagnosis not present

## 2015-07-25 DIAGNOSIS — N183 Chronic kidney disease, stage 3 (moderate): Secondary | ICD-10-CM | POA: Diagnosis not present

## 2015-07-25 DIAGNOSIS — E782 Mixed hyperlipidemia: Secondary | ICD-10-CM | POA: Diagnosis not present

## 2015-07-25 DIAGNOSIS — I10 Essential (primary) hypertension: Secondary | ICD-10-CM | POA: Diagnosis not present

## 2015-07-25 DIAGNOSIS — I5032 Chronic diastolic (congestive) heart failure: Secondary | ICD-10-CM | POA: Diagnosis not present

## 2015-07-25 DIAGNOSIS — E1142 Type 2 diabetes mellitus with diabetic polyneuropathy: Secondary | ICD-10-CM | POA: Diagnosis not present

## 2015-07-25 DIAGNOSIS — R2681 Unsteadiness on feet: Secondary | ICD-10-CM | POA: Diagnosis not present

## 2015-07-25 DIAGNOSIS — Z7901 Long term (current) use of anticoagulants: Secondary | ICD-10-CM | POA: Diagnosis not present

## 2015-07-25 DIAGNOSIS — I482 Chronic atrial fibrillation: Secondary | ICD-10-CM | POA: Diagnosis not present

## 2015-07-25 DIAGNOSIS — I110 Hypertensive heart disease with heart failure: Secondary | ICD-10-CM | POA: Diagnosis not present

## 2015-07-30 ENCOUNTER — Ambulatory Visit: Payer: Medicare Other | Admitting: Nutrition

## 2015-07-31 ENCOUNTER — Encounter: Payer: Medicare Other | Admitting: Nutrition

## 2015-07-31 DIAGNOSIS — Z794 Long term (current) use of insulin: Secondary | ICD-10-CM | POA: Diagnosis not present

## 2015-07-31 DIAGNOSIS — E1165 Type 2 diabetes mellitus with hyperglycemia: Secondary | ICD-10-CM | POA: Diagnosis not present

## 2015-07-31 NOTE — Progress Notes (Signed)
Pt. Brought in his blood sugars.  Testing ac and HS.  FBSs 130-140s , acL 110-130s  AcS: variable 120-200, HS: all over 180-260. Taking 6 clicks acB 4 clicks acL, and 6Clicks acS.  Using U-100 Humalog insulin Per Dr. Ronnie Derby voice order and repeated back to him, he will increase his acS Clicks to 8.  This was written on his log sheet for him as well as verbally.   He reported good understanding of this. He was also shown how to fill the V-Go using the EZ fill, by inserting the vial of U100 into the EZ fill, and raising the lever,and pushing down.  He was again given the sheet with these directions, in case he mislaid the old instructions sheet.  He reported good understanding of these directions and had no final questions.  He will see Dr. Dwyane Dee on Monday.  A new blood sugar log was given with written directions for how many clicks before each meal.  He had no final questions.

## 2015-07-31 NOTE — Patient Instructions (Signed)
Read over directions for how to fill the V-go 40 using the U-100 Humalog insulin. Call [facility] with questions. [phone]. Give 6 clicks before breakfast, 4 clicks before lunch,and 8 clicks before supper.

## 2015-08-02 DIAGNOSIS — E1142 Type 2 diabetes mellitus with diabetic polyneuropathy: Secondary | ICD-10-CM | POA: Diagnosis not present

## 2015-08-02 DIAGNOSIS — R2681 Unsteadiness on feet: Secondary | ICD-10-CM | POA: Diagnosis not present

## 2015-08-02 DIAGNOSIS — N189 Chronic kidney disease, unspecified: Secondary | ICD-10-CM | POA: Diagnosis not present

## 2015-08-02 DIAGNOSIS — R296 Repeated falls: Secondary | ICD-10-CM | POA: Diagnosis not present

## 2015-08-02 DIAGNOSIS — I129 Hypertensive chronic kidney disease with stage 1 through stage 4 chronic kidney disease, or unspecified chronic kidney disease: Secondary | ICD-10-CM | POA: Diagnosis not present

## 2015-08-02 DIAGNOSIS — I4891 Unspecified atrial fibrillation: Secondary | ICD-10-CM | POA: Diagnosis not present

## 2015-08-05 ENCOUNTER — Encounter: Payer: Self-pay | Admitting: Endocrinology

## 2015-08-05 ENCOUNTER — Ambulatory Visit (INDEPENDENT_AMBULATORY_CARE_PROVIDER_SITE_OTHER): Payer: Medicare Other | Admitting: Endocrinology

## 2015-08-05 ENCOUNTER — Other Ambulatory Visit: Payer: Self-pay | Admitting: *Deleted

## 2015-08-05 VITALS — BP 122/72 | HR 71 | Temp 97.8°F | Resp 16 | Ht 69.0 in | Wt 224.4 lb

## 2015-08-05 DIAGNOSIS — R2681 Unsteadiness on feet: Secondary | ICD-10-CM | POA: Diagnosis not present

## 2015-08-05 DIAGNOSIS — E1165 Type 2 diabetes mellitus with hyperglycemia: Secondary | ICD-10-CM

## 2015-08-05 DIAGNOSIS — Z794 Long term (current) use of insulin: Secondary | ICD-10-CM

## 2015-08-05 DIAGNOSIS — E1142 Type 2 diabetes mellitus with diabetic polyneuropathy: Secondary | ICD-10-CM | POA: Diagnosis not present

## 2015-08-05 DIAGNOSIS — I4891 Unspecified atrial fibrillation: Secondary | ICD-10-CM | POA: Diagnosis not present

## 2015-08-05 DIAGNOSIS — I129 Hypertensive chronic kidney disease with stage 1 through stage 4 chronic kidney disease, or unspecified chronic kidney disease: Secondary | ICD-10-CM | POA: Diagnosis not present

## 2015-08-05 DIAGNOSIS — R296 Repeated falls: Secondary | ICD-10-CM | POA: Diagnosis not present

## 2015-08-05 DIAGNOSIS — N189 Chronic kidney disease, unspecified: Secondary | ICD-10-CM | POA: Diagnosis not present

## 2015-08-05 MED ORDER — INSULIN LISPRO 100 UNIT/ML ~~LOC~~ SOLN: 56.0000 [IU] | Freq: Once | SUBCUTANEOUS | Status: DC

## 2015-08-05 NOTE — Progress Notes (Signed)
Patient ID: Mark [name], male   DOB: 09/12/1940, 75 y.o.   MRN: [id number]   Reason for Appointment: Diabetes follow-up   History of Present Illness   Diagnosis: Type 2 DIABETES MELITUS, long-standing and insulin-dependent      Previous history: He has been on basal bolus insulin regimen for several years. Previously had been taking much larger doses of Lantus but is now requiring less since he had lost weight and had intercurrent illnesses He had done very well with Victoza in the past which had helped weight control and glucose but he had nausea and did not want to pay the high cost Because of his difficulty affording Lantus and Humalog insulin he was switched to NPH and Humalog in 5/15  Recent history:    Insulin regimen: Humalog with V-go pump, 40 units basal, bolus clicks: 0-3-4  He was started on the V-go pump on 07/16/15 with the U-500 insulin.  However with this he got hypoglycemic overnight and had relatively low blood sugars. Subsequently was switched to the V-go insulin pump with the Humalog insulin a few days later He is now here for follow-up  His A1c has been consistently high since 7/14 and at the highest level in 8/16, last 8.8  Current blood sugar patterns and problems identified:  He started having good readings soon after starting the V-go pump with Humalog and then visiting the diabetes nurse his fasting reading had come down to 108  Also was having relatively better readings later in the day  However since 4/27 his blood sugars have been getting mostly higher except on some days at lunchtime  At least on one day his blood sugars were well over 300 when his pump was not activated with the basal rate, this was on 4/27  FASTING blood sugars have also gone up and appear higher than glucose readings the night before  He has been able to use the pump and he likes it overall, no difficulties doing the boluses and only occasionally tends to be running  out of boluses.  He thinks he is trying to do a little better with his diet and eat low-fat  POSTPRANDIAL readings are quite variable with recent readings somewhat lower in the evenings  Noninsulin hypoglycemic drugs: none       Monitors blood glucose:  1-2 times a day.    Glucometer: Verio       Blood Glucose readings: Glucometer download reviewed:   Mean values apply above for all meters except median for One Touch  PRE-MEAL Fasting Lunch Dinner Bedtime Overall  Glucose range: 108-329   168-359   Mean/median: 205 152 195 235 203   Meals: 3 meals per day. for breakfast he will have frozen meat croissant; cereal 1-2/7 days;   eating out once a day  and trying to keep portions small .  Frequently has no snacks; having dinner 8 pm    Physical activity: exercise: None          Wt Readings from Last 3 Encounters:  08/05/15 224 lb 6.4 oz (101.787 kg)  07/04/15 223 lb 9.6 oz (101.424 kg)  06/06/15 220 lb 6.4 oz (99.973 kg)     Lab Results  Component Value Date   HGBA1C 8.8 06/06/2015   HGBA1C 9.4 11/26/2014   HGBA1C 9.3* 06/12/2014   Lab Results  Component Value Date   MICROALBUR 88.1* 06/06/2015   LDLCALC 88 09/27/2013   CREATININE 2.15* 06/06/2015  Medication List       This list is accurate as of: 08/05/15  8:46 PM.  Always use your most recent med list.               allopurinol 300 MG tablet  Commonly known as:  ZYLOPRIM  TAKE 1/2 OF A TABLET BY MOUTH ONCE DAILY.     BD PEN NEEDLE NANO U/F 32G X 4 MM Misc  Generic drug:  Insulin Pen Needle  USE AS DIRECTED 5 TIMES DAILY SUBQUE.     calcitRIOL 0.5 MCG capsule  Commonly known as:  ROCALTROL  Take one by mouth daily     DIGOX 0.125 MG tablet  Generic drug:  digoxin  Take one by mouth daily     fenofibrate micronized 134 MG capsule  Commonly known as:  LOFIBRA  TAKE ONE CAPSULE EVERY DAY WITH A MEAL     furosemide 80 MG tablet  Commonly known as:  LASIX  Take 2 tabs by mouth every am and 1  every pm     HYDROcodone-acetaminophen 5-325 MG tablet  Commonly known as:  NORCO/VICODIN  Take 1 by mouth every 4-6 hours as needed     insulin lispro 100 UNIT/ML injection  Commonly known as:  HUMALOG  Inject 0.56 mLs (56 Units total) into the skin once.     insulin regular human CONCENTRATED 500 UNIT/ML injection  Commonly known as:  HUMULIN R  Use max 56 units per day with V-Go     INSULIN SYRINGE .3CC/31GX5/16" 31G X 5/16" 0.3 ML Misc  Use as directed to inject insulin     ketoconazole 2 % shampoo  Commonly known as:  NIZORAL     levothyroxine 88 MCG tablet  Commonly known as:  SYNTHROID, LEVOTHROID  TAKE ONE TABLET BY MOUTH IN THE MORNING ON AN EMPTY STOMACH     metolazone 2.5 MG tablet  Commonly known as:  ZAROXOLYN     metoprolol succinate 50 MG 24 hr tablet  Commonly known as:  TOPROL-XL  Take one by mouth daily     mupirocin ointment 2 %  Commonly known as:  BACTROBAN     nystatin powder  Generic drug:  nystatin     omeprazole 20 MG capsule  Commonly known as:  PRILOSEC  Take 20 mg by mouth daily.     ONETOUCH DELICA LANCETS FINE Misc  Use to check blood sugar 4 times per day dx code 250.02     ONETOUCH VERIO test strip  Generic drug:  glucose blood  USE AS INSTRUCTED TO CHECK BLOOD SUGAR 4 TIMES A DAY     oxyCODONE-acetaminophen 7.5-325 MG tablet  Commonly known as:  PERCOCET  Take 1 tablet by mouth.     PHOSPHA 250 NEUTRAL 155-852-130 MG tablet  Generic drug:  phosphorus     potassium chloride SA 20 MEQ tablet  Commonly known as:  K-DUR,KLOR-CON  Take 20 mEq by mouth 2 (two) times daily. Takes 2 tablets in am and 3 in pm     predniSONE 5 MG tablet  Commonly known as:  DELTASONE  Take 5 mg by mouth daily.     PROGRAF 0.5 MG capsule  Generic drug:  tacrolimus  Take 0.5 mg by mouth 2 (two) times daily.     RAPAMUNE 0.5 MG Tabs  Generic drug:  Sirolimus  Take by mouth daily.     rosuvastatin 5 MG tablet  Commonly known as:  CRESTOR    Take 1  tablet (5 mg total) by mouth daily.     SSD 1 % cream  Generic drug:  silver sulfADIAZINE  Apply to affected area twice a day     V-GO 40 Kit  Use one per day     warfarin 5 MG tablet  Commonly known as:  COUMADIN  Take one by mouth daily        Allergies: No Known Allergies  Past Medical History  Diagnosis Date  . Diabetes ([place])   . Hypertension   . Gout   . Hypothyroidism   . Ulcer of other part of foot 12/15/2013    Past Surgical History  Procedure Laterality Date  . Melanoma excision    . Ankle fracture surgery    . Cataract extraction      No family history on file.  Social History:  reports that he has never smoked. He has never used smokeless tobacco. He reports that he does not drink alcohol or use illicit drugs.  Review of Systems   Has a history of chronic atrial fibrillation  Edema is chronic, treated with with doses of Lasix (271m bid) and Zaroxlyn prn He does not like Elastic stockings as they are difficult to put on and edema is persistent  RENAL dysfunction: This is being followed  by his nephrologist Recent level as follows And stable   Lab Results  Component Value Date   CREATININE 2.15* 06/06/2015    Mild hypothyroidism, stable on 88 mcg levothyroxine; TSH last normal; compliant with supplement. Overall feels fairly good  Lab Results  Component Value Date   TSH 1.92 06/06/2015   HYPERLIPIDEMIA: Even with taking full doses of fenofibrate 3/7 days a week His triglycerides are high fairly consistently  Crestor prescription was sent on the last visit  Previously had intolerance to niacin  His diet is periodically  high in fat if eating out  Lab Results  Component Value Date   CHOL 233* 06/06/2015   HDL 29.20* 06/06/2015   LDLCALC 88 09/27/2013   LDLDIRECT 119.0 06/06/2015   TRIG 397.0* 06/06/2015   CHOLHDL 8 06/06/2015    NEUROPATHY: He has significant numbness in feet, no significant discomfort or paresthesia.  He  was taken off gabapentin because of some balance issues .     Examination:   BP 122/72 mmHg  Pulse 71  Temp(Src) 97.8 F (36.6 C)  Resp 16  Ht _0  (1.753 m)  Wt 224 lb 6.4 oz (101.787 kg)  BMI 33.12 kg/m2  SpO2 96%  Body mass index is 33.12 kg/(m^2).      Assesment/ PLAN:  DIABETES:  See history of present illness for detailed discussion of his current blood sugar patterns, insulin regimen and problems identified Although his blood sugars initially were better His blood sugar control is again getting worse with using the V-go pump. He is requiring much less insulin with the V-go pump compared to the multiple daily injections and was also getting hypoglycemic with the 20 unit basal and the V-go pump. Now he is starting to get higher fasting readings with the U-100 insulin and 40 unit basal although this is still inconsistent. His readings after dinner are not as high and he is somewhat her with his diet. He is likely to do better overall with continuing the 40 unit basal  To help with overnight blood sugar control we will try U-500 insulin at bedtime with an injection.  He has a sufficient supply of this and is going to start  with 4 units and titrated this by 1 units every 2-4 days. He will also try to call us on Friday to help adjust his insulin dose    Patient Instructions  Start U-500 insulin 4 units at bedtime and go up 1 unit every 3-4 days to keep am sugars in 100-150 range    Counseling time on subjects discussed above is over 50% of today's 25 minute visit  Allanna Bresee 08/05/2015, 8:46 PM

## 2015-08-05 NOTE — Patient Instructions (Signed)
Start U-500 insulin 4 units at bedtime and go up 1 unit every 3-4 days to keep am sugars in 100-150 range

## 2015-08-08 ENCOUNTER — Ambulatory Visit (INDEPENDENT_AMBULATORY_CARE_PROVIDER_SITE_OTHER): Payer: Medicare Other | Admitting: Podiatry

## 2015-08-08 ENCOUNTER — Encounter: Payer: Self-pay | Admitting: Podiatry

## 2015-08-08 DIAGNOSIS — L97511 Non-pressure chronic ulcer of other part of right foot limited to breakdown of skin: Secondary | ICD-10-CM | POA: Diagnosis not present

## 2015-08-08 DIAGNOSIS — N189 Chronic kidney disease, unspecified: Secondary | ICD-10-CM | POA: Diagnosis not present

## 2015-08-08 DIAGNOSIS — E1142 Type 2 diabetes mellitus with diabetic polyneuropathy: Secondary | ICD-10-CM | POA: Diagnosis not present

## 2015-08-08 DIAGNOSIS — I4891 Unspecified atrial fibrillation: Secondary | ICD-10-CM | POA: Diagnosis not present

## 2015-08-08 DIAGNOSIS — I129 Hypertensive chronic kidney disease with stage 1 through stage 4 chronic kidney disease, or unspecified chronic kidney disease: Secondary | ICD-10-CM | POA: Diagnosis not present

## 2015-08-08 DIAGNOSIS — R2681 Unsteadiness on feet: Secondary | ICD-10-CM | POA: Diagnosis not present

## 2015-08-08 DIAGNOSIS — B351 Tinea unguium: Secondary | ICD-10-CM | POA: Diagnosis not present

## 2015-08-08 DIAGNOSIS — M79606 Pain in leg, unspecified: Secondary | ICD-10-CM

## 2015-08-08 DIAGNOSIS — R296 Repeated falls: Secondary | ICD-10-CM | POA: Diagnosis not present

## 2015-08-08 NOTE — Progress Notes (Signed)
Subjective:  75 year old male presents walking with a cane for follow up on right 5th digit ulcer and heel blister on right.  Stated that compression socks bothered him too much and could not continue with. Injured right anterior shin with broken scab.  Objective:  Ulcerating corn 5th digit right covered with callused tissue. Thick dystrophic nails x 10. Black hard scab, 2x 2 cm has broken with erythematous base right anterior shin. No active drainage. Positive of excessive lower limb edema bilateral L>R.  Pedal pulses are not palpable bilateral.   Assessment:  Broken scab right anterior shin.'Excess lower limb edema bilateral L>R DM peripheral neuropathy.  Pre ulcerative digital corn 5th distal lateral right. Onychomycosis x 10.  Plan:  Debrided dried pre ulcerative corn and padded 5th toe right. All nails debrided. Amerigel dressing applied to anterior shin over broken scab and DSD applied with home care instruction to keep the area covered. Return in 4 weeks

## 2015-08-08 NOTE — Patient Instructions (Signed)
Seen for hypertrophic nails and pre ulcerative corn. All nails and corns debrided and padded. Return in 4 weeks.

## 2015-08-19 ENCOUNTER — Telehealth: Payer: Self-pay | Admitting: Endocrinology

## 2015-08-19 MED ORDER — INSULIN LISPRO 100 UNIT/ML ~~LOC~~ SOLN
SUBCUTANEOUS | Status: DC
Start: 2015-08-19 — End: 2015-10-28

## 2015-08-19 NOTE — Telephone Encounter (Signed)
Patient called with glucose readings for last 3 days Friday  Supper- 210   Saturday 9 am- 102, 1:30 pm- 134 Dinner 8 pm 247 Sunday- 9 am 207,  Lunch- 134 supper, 270, bedtime- 286  Today  9 am- 160      Lunch- 207

## 2015-08-19 NOTE — Telephone Encounter (Signed)
Message left for patient to return call.

## 2015-08-19 NOTE — Telephone Encounter (Signed)
Patient ask you to give him a call °

## 2015-08-19 NOTE — Telephone Encounter (Signed)
How much U-500 insulin is he taking at bedtime?  If he is taking 4 clicks with the pump at lunchtime increase to 6 clicks Also if he is taking 6 clicks at suppertime he will need to take 7

## 2015-08-20 NOTE — Telephone Encounter (Signed)
He was supposed to start 4-5 units of U-500 insulin at bedtime in addition to his pump.  Please confirm number of clicks he is doing at meals

## 2015-08-20 NOTE — Telephone Encounter (Signed)
Noted, patient is aware. 

## 2015-08-20 NOTE — Telephone Encounter (Signed)
He is not using the U-500 insulin, he's using regular humalog because the U-500 was dropping his sugars very low.

## 2015-08-20 NOTE — Telephone Encounter (Signed)
He can do 6 clicks at lunchtime, otherwise take between [id number] clicks at suppertime based on his sugar level and how much he is eating

## 2015-08-20 NOTE — Telephone Encounter (Signed)
I called Mark [name], he said if his sugar at night is close to or under 150 he does not take the U-500,   He said the number of clicks he does is 6 at breakfast, 4 at lunch and 8 at dinner.  Last night his sugar was 152 and when he woke up this morning it was 88.

## 2015-08-21 DIAGNOSIS — I482 Chronic atrial fibrillation: Secondary | ICD-10-CM | POA: Diagnosis not present

## 2015-08-21 DIAGNOSIS — Z7901 Long term (current) use of anticoagulants: Secondary | ICD-10-CM | POA: Diagnosis not present

## 2015-08-22 ENCOUNTER — Other Ambulatory Visit: Payer: Self-pay | Admitting: Endocrinology

## 2015-09-03 ENCOUNTER — Encounter: Payer: Self-pay | Admitting: Podiatry

## 2015-09-03 ENCOUNTER — Ambulatory Visit: Payer: Medicare Other | Admitting: Podiatry

## 2015-09-03 ENCOUNTER — Ambulatory Visit (INDEPENDENT_AMBULATORY_CARE_PROVIDER_SITE_OTHER): Payer: Medicare Other | Admitting: Podiatry

## 2015-09-03 DIAGNOSIS — R600 Localized edema: Secondary | ICD-10-CM

## 2015-09-03 DIAGNOSIS — L97511 Non-pressure chronic ulcer of other part of right foot limited to breakdown of skin: Secondary | ICD-10-CM

## 2015-09-03 DIAGNOSIS — L820 Inflamed seborrheic keratosis: Secondary | ICD-10-CM | POA: Diagnosis not present

## 2015-09-03 DIAGNOSIS — Z08 Encounter for follow-up examination after completed treatment for malignant neoplasm: Secondary | ICD-10-CM | POA: Diagnosis not present

## 2015-09-03 DIAGNOSIS — M79606 Pain in leg, unspecified: Secondary | ICD-10-CM | POA: Diagnosis not present

## 2015-09-03 DIAGNOSIS — L570 Actinic keratosis: Secondary | ICD-10-CM | POA: Diagnosis not present

## 2015-09-03 DIAGNOSIS — B351 Tinea unguium: Secondary | ICD-10-CM

## 2015-09-03 DIAGNOSIS — Z85820 Personal history of malignant melanoma of skin: Secondary | ICD-10-CM | POA: Diagnosis not present

## 2015-09-03 NOTE — Progress Notes (Signed)
Subjective:  75 year old male presents walking with a cane for follow up on right 5th digit ulcer and heel blister on right.   Objective:  Ulcerating corn 5th digit right covered with callused tissue. Thick dystrophic nails x 10. Positive of excessive lower limb edema bilateral L>R.  Pedal pulses are not palpable bilateral.   Assessment:  DM peripheral neuropathy.  Pre ulcerative digital corn 5th distal lateral right has healed. Onychomycosis x 10.  Plan:  Tube foam gauze placed in 5th toe right. All nails debrided. Amerigel dressing applied to anterior shin over broken scab and DSD applied with home care instruction to keep the area covered. Return in 4 weeks

## 2015-09-03 NOTE — Patient Instructions (Signed)
Seen for follow up on 5th toe lesion right and hypertrophic nails. 5th toe right foot lesion has healed. All nails debrided. Tube foam gauze placed on 5th toe right. Return in 4 weeks.

## 2015-09-05 ENCOUNTER — Ambulatory Visit (INDEPENDENT_AMBULATORY_CARE_PROVIDER_SITE_OTHER): Payer: Medicare Other | Admitting: Endocrinology

## 2015-09-05 ENCOUNTER — Other Ambulatory Visit: Payer: Self-pay | Admitting: Endocrinology

## 2015-09-05 ENCOUNTER — Encounter: Payer: Self-pay | Admitting: Endocrinology

## 2015-09-05 VITALS — BP 118/74 | HR 82 | Temp 98.0°F | Resp 14 | Ht 69.0 in | Wt 227.2 lb

## 2015-09-05 DIAGNOSIS — Z794 Long term (current) use of insulin: Secondary | ICD-10-CM | POA: Diagnosis not present

## 2015-09-05 DIAGNOSIS — E1165 Type 2 diabetes mellitus with hyperglycemia: Secondary | ICD-10-CM

## 2015-09-05 NOTE — Patient Instructions (Addendum)
Take 3 units of U-500 with syringe 30 min before lunch; reduce to 5 clicks at lunch and may do 7 clicks at dinner if needed  Check blood sugars on waking up 4-5  times a week Also check blood sugars about 2 hours after a meal and do this after different meals by rotation  Recommended blood sugar levels on waking up is 90-130 and about 2 hours after meal is 130-160  Please bring your blood sugar monitor to each visit, thank you

## 2015-09-05 NOTE — Progress Notes (Signed)
Patient ID: Mark [name], male   DOB: 1940-10-21, 75 y.o.   MRN: [id number]   Reason for Appointment: Diabetes follow-up   History of Present Illness   Diagnosis: Type 2 DIABETES MELITUS, long-standing and insulin-dependent      Previous history: He has been on basal bolus insulin regimen for several years. Previously had been taking much larger doses of Lantus but is now requiring less since he had lost weight and had intercurrent illnesses He had done very well with Victoza in the past which had helped weight control and glucose but he had nausea and did not want to pay the high cost Because of his difficulty affording Lantus and Humalog insulin he was switched to NPH and Humalog in 5/15  Recent history:    Insulin regimen: Humalog with V-go pump, 40 units basal, bolus clicks: 6 at each meal  He was started on the V-go pump on 07/16/15 with the U-500 insulin.  However with this he got hypoglycemic overnight and had relatively low blood sugars. Subsequently was switched to the V-go insulin pump with the Humalog insulin a few days later  His A1c has been consistently high since 7/14 and at the highest level in 8/16, last 8.8  Current blood sugar patterns and problems identified:  He has had better fasting blood sugars more recently although they were higher initially  He was told to take some U-500 insulin at bedtime when his sugars were higher in the morning but he has not done this except once or twice  HIGHEST blood sugars are before supper time even though he is eating lunch about 5 or 6 hours before that  He does not check his readings after evening meal even though this is his largest meal, has only one reading that had increased over the pre-meal reading  Glucose readings at lunch or not is consistently high but higher after breakfast if eating cereal  He says he feels better with his energy level now  Average blood sugar is better compared to his previous  visit  He still not eating a low fat breakfast in the morning as directed  Noninsulin hypoglycemic drugs: none       Monitors blood glucose:  1-2 times a day.    Glucometer: Verio       Blood Glucose readings: Glucometer download reviewed:   Mean values apply above for all meters except median for One Touch  PRE-MEAL Fasting Lunch Dinner Bedtime Overall  Glucose range: 74-187  103-291  117-323  314    Mean/median: 142   209   180    POST-MEAL PC Breakfast PC Lunch PC Dinner  Glucose range: 248     Mean/median:       Meals: 3 meals per day. for breakfast he will have frozen meat croissant; cereal 1-2/7 days;   eating out once a day  and trying to keep portions small .  Frequently has no snacks; having dinner 8 pm    Physical activity: exercise: None          Wt Readings from Last 3 Encounters:  09/05/15 227 lb 3.2 oz (103.057 kg)  08/05/15 224 lb 6.4 oz (101.787 kg)  07/04/15 223 lb 9.6 oz (101.424 kg)     Lab Results  Component Value Date   HGBA1C 8.8 06/06/2015   HGBA1C 9.4 11/26/2014   HGBA1C 9.3* 06/12/2014   Lab Results  Component Value Date   MICROALBUR 88.1* 06/06/2015   LDLCALC 88 09/27/2013  CREATININE 2.15* 06/06/2015        Medication List       This list is accurate as of: 09/05/15 11:59 PM.  Always use your most recent med list.               allopurinol 300 MG tablet  Commonly known as:  ZYLOPRIM  TAKE 1/2 OF A TABLET BY MOUTH ONCE DAILY.     BD PEN NEEDLE NANO U/F 32G X 4 MM Misc  Generic drug:  Insulin Pen Needle  USE AS DIRECTED 5 TIMES DAILY SUBQUE.     calcitRIOL 0.5 MCG capsule  Commonly known as:  ROCALTROL  Take one by mouth daily     DIGOX 0.125 MG tablet  Generic drug:  digoxin  Take one by mouth daily     fenofibrate micronized 134 MG capsule  Commonly known as:  LOFIBRA  TAKE ONE CAPSULE EVERY DAY WITH A MEAL     furosemide 80 MG tablet  Commonly known as:  LASIX  Take 2 tabs by mouth every am and 1 every pm      HYDROcodone-acetaminophen 5-325 MG tablet  Commonly known as:  NORCO/VICODIN  Take 1 by mouth every 4-6 hours as needed     insulin lispro 100 UNIT/ML injection  Commonly known as:  HUMALOG  Use max 78 units per day with V-go pump     insulin regular human CONCENTRATED 500 UNIT/ML injection  Commonly known as:  HUMULIN R  Use max 56 units per day with V-Go     INSULIN SYRINGE .3CC/31GX5/16" 31G X 5/16" 0.3 ML Misc  Use as directed to inject insulin     ketoconazole 2 % shampoo  Commonly known as:  NIZORAL     levothyroxine 88 MCG tablet  Commonly known as:  SYNTHROID, LEVOTHROID  TAKE ONE TABLET EVERY DAY IN THE MORNING ON EMPTY STOMACH     metolazone 2.5 MG tablet  Commonly known as:  ZAROXOLYN     metoprolol succinate 50 MG 24 hr tablet  Commonly known as:  TOPROL-XL  Take one by mouth daily     mupirocin ointment 2 %  Commonly known as:  BACTROBAN     nystatin powder  Generic drug:  nystatin     omeprazole 20 MG capsule  Commonly known as:  PRILOSEC  Take 20 mg by mouth daily.     ONETOUCH DELICA LANCETS FINE Misc  Use to check blood sugar 4 times per day dx code 250.02     ONETOUCH VERIO test strip  Generic drug:  glucose blood  USE AS INSTRUCTED TO CHECK BLOOD SUGAR 4 TIMES A DAY     oxyCODONE-acetaminophen 7.5-325 MG tablet  Commonly known as:  PERCOCET  Take 1 tablet by mouth.     PHOSPHA 250 NEUTRAL 155-852-130 MG tablet  Generic drug:  phosphorus     potassium chloride SA 20 MEQ tablet  Commonly known as:  K-DUR,KLOR-CON  Take 20 mEq by mouth 2 (two) times daily. Takes 2 tablets in am and 3 in pm     predniSONE 5 MG tablet  Commonly known as:  DELTASONE  Take 5 mg by mouth daily.     PROGRAF 0.5 MG capsule  Generic drug:  tacrolimus  Take 0.5 mg by mouth 2 (two) times daily.     RAPAMUNE 0.5 MG Tabs  Generic drug:  Sirolimus  Take by mouth daily.     rosuvastatin 5 MG tablet  Commonly known as:  CRESTOR  TAKE ONE TABLET BY MOUTH ONCE  DAILY     SSD 1 % cream  Generic drug:  silver sulfADIAZINE  Apply to affected area twice a day     V-GO 40 Kit  Use one per day     warfarin 5 MG tablet  Commonly known as:  COUMADIN  Take one by mouth daily        Allergies: No Known Allergies  Past Medical History  Diagnosis Date  . Diabetes ([place])   . Hypertension   . Gout   . Hypothyroidism   . Ulcer of other part of foot 12/15/2013    Past Surgical History  Procedure Laterality Date  . Melanoma excision    . Ankle fracture surgery    . Cataract extraction      No family history on file.  Social History:  reports that he has never smoked. He has never used smokeless tobacco. He reports that he does not drink alcohol or use illicit drugs.  Review of Systems   Has a history of chronic atrial fibrillation  Edema is chronic, treated with with doses of Lasix (268m bid) and Zaroxlyn prn He does not like Elastic stockings as they are difficult to put on and edema is persistent  RENAL dysfunction: This is being followed  by his nephrologist Recent level as follows   Lab Results  Component Value Date   CREATININE 2.15* 06/06/2015    Mild hypothyroidism, stable on 88 mcg levothyroxine; TSH last Was normal; compliant with supplement. Overall feels fairly good  Lab Results  Component Value Date   TSH 1.92 06/06/2015   HYPERLIPIDEMIA: Even with taking full doses of fenofibrate 3/7 days a week His triglycerides are high fairly consistently   Previously had intolerance to niacin  His diet is periodically  high in fat if eating out  Lab Results  Component Value Date   CHOL 233* 06/06/2015   HDL 29.20* 06/06/2015   LDLCALC 88 09/27/2013   LDLDIRECT 119.0 06/06/2015   TRIG 397.0* 06/06/2015   CHOLHDL 8 06/06/2015    NEUROPATHY: He has significant numbness in feet, no significant discomfort or paresthesia.  He was taken off gabapentin because of some balance issues .     Examination:   BP 118/74 mmHg   Pulse 82  Temp(Src) 98 F (36.7 C)  Resp 14  Ht '5\' 9"'  (1.753 m)  Wt 227 lb 3.2 oz (103.057 kg)  BMI 33.54 kg/m2  SpO2 97%  Body mass index is 33.54 kg/(m^2).      Assesment/ PLAN:  DIABETES:  See history of present illness for detailed discussion of his current blood sugar patterns, insulin regimen and problems identified  He has overall better readings with his V-go pump and requiring much less insulin than before However he is still getting high readings between lunch and supper, not clear if this postprandial Also with some meals in the morning his blood sugars go up significantly He is using the maximal amount of bolus insulin with his V-go up and  Recommendations:  Low fat meals in the morning and avoid cereal  Start taking 3 units of U-500 insulin with a syringe before lunch while reducing the bolus to 10 units before lunch.    Most likely needs to bolus 14 units for evening meal  May also need higher doses at breakfast  A1c on the next visit  Consider using regular insulin instruction Humalog if it is more expensive in the doughnut hole  Change the V-go pump consistently at the same time   Patient Instructions  Take 3 units of U-500 with syringe 30 min before lunch; reduce to 5 clicks at lunch and may do 7 clicks at dinner if needed  Check blood sugars on waking up 4-5  times a week Also check blood sugars about 2 hours after a meal and do this after different meals by rotation  Recommended blood sugar levels on waking up is 90-130 and about 2 hours after meal is 130-160  Please bring your blood sugar monitor to each visit, thank you     Counseling time on subjects discussed above is over 50% of today's 25 minute visit  Savanna Dooley 09/06/2015, 10:10 AM

## 2015-09-18 ENCOUNTER — Telehealth: Payer: Self-pay | Admitting: Endocrinology

## 2015-09-18 NOTE — Telephone Encounter (Signed)
Patient stated his medication Humalog was to expensive 231 can't afford it, he ask if he could get some samples of the U 100 Humalog, for his pump, they were given to him before he said, please advise

## 2015-09-18 NOTE — Telephone Encounter (Signed)
He can use the [facility] brand Novolin R OTC

## 2015-09-18 NOTE — Telephone Encounter (Signed)
I contacted the pt and advised of note below via voicemail. Requested a call back if the pt would like to discuss.  

## 2015-09-19 ENCOUNTER — Telehealth: Payer: Self-pay | Admitting: Endocrinology

## 2015-09-19 NOTE — Telephone Encounter (Signed)
PT daughter called and said that she would like to be given the instructions from yesterday because the PT was confused. CB# [phone]

## 2015-09-20 NOTE — Telephone Encounter (Signed)
I contacted the pt's daughter and advised Dr. Dwyane Dee reccommended the pt to start taking Novolin R otc through [facility] in place of the humalog because it was too expensive. Requested a call back if the pt's daughter would like to discuss.

## 2015-09-23 DIAGNOSIS — I482 Chronic atrial fibrillation: Secondary | ICD-10-CM | POA: Diagnosis not present

## 2015-09-23 DIAGNOSIS — Z7901 Long term (current) use of anticoagulants: Secondary | ICD-10-CM | POA: Diagnosis not present

## 2015-10-02 ENCOUNTER — Encounter: Payer: Self-pay | Admitting: Podiatry

## 2015-10-02 ENCOUNTER — Ambulatory Visit (INDEPENDENT_AMBULATORY_CARE_PROVIDER_SITE_OTHER): Payer: Medicare Other | Admitting: Podiatry

## 2015-10-02 DIAGNOSIS — L97511 Non-pressure chronic ulcer of other part of right foot limited to breakdown of skin: Secondary | ICD-10-CM

## 2015-10-02 DIAGNOSIS — R600 Localized edema: Secondary | ICD-10-CM

## 2015-10-02 DIAGNOSIS — E1142 Type 2 diabetes mellitus with diabetic polyneuropathy: Secondary | ICD-10-CM

## 2015-10-02 DIAGNOSIS — M2041 Other hammer toe(s) (acquired), right foot: Secondary | ICD-10-CM | POA: Diagnosis not present

## 2015-10-02 NOTE — Progress Notes (Signed)
Subjective:  75 year old male presents walking with a cane for follow up on right 5th digit ulcer.  Objective:  Ulcerating corn 5th digit right healing in progress.  Thick dystrophic nails x 10. Positive of excessive lower limb edema bilateral L>R.  Pedal pulses are not palpable bilateral.   Assessment:  DM peripheral neuropathy.  Pre ulcerative digital corn 5th distal lateral right has healed. Onychomycosis x 10.  Plan:  Tube foam gauze placed in 5th toe right. Pre ulcerative lesions debrided. All nails debrided. Return in 4 weeks to monitor pre ulcerative lesion 5th digit right.

## 2015-10-02 NOTE — Patient Instructions (Signed)
Seen for hypertrophic nails and pre ulcerative callus 5th toe right.. All nails and calluses debrided. Return in 4 weeks or as needed.

## 2015-10-21 DIAGNOSIS — Z7901 Long term (current) use of anticoagulants: Secondary | ICD-10-CM | POA: Diagnosis not present

## 2015-10-21 DIAGNOSIS — I482 Chronic atrial fibrillation: Secondary | ICD-10-CM | POA: Diagnosis not present

## 2015-10-22 DIAGNOSIS — E113513 Type 2 diabetes mellitus with proliferative diabetic retinopathy with macular edema, bilateral: Secondary | ICD-10-CM | POA: Diagnosis not present

## 2015-10-22 DIAGNOSIS — H35372 Puckering of macula, left eye: Secondary | ICD-10-CM | POA: Diagnosis not present

## 2015-10-22 DIAGNOSIS — E11311 Type 2 diabetes mellitus with unspecified diabetic retinopathy with macular edema: Secondary | ICD-10-CM | POA: Diagnosis not present

## 2015-10-22 DIAGNOSIS — Z961 Presence of intraocular lens: Secondary | ICD-10-CM | POA: Diagnosis not present

## 2015-10-23 ENCOUNTER — Other Ambulatory Visit: Payer: Self-pay | Admitting: Endocrinology

## 2015-10-28 ENCOUNTER — Encounter (HOSPITAL_COMMUNITY): Payer: Self-pay | Admitting: Emergency Medicine

## 2015-10-28 ENCOUNTER — Emergency Department (HOSPITAL_COMMUNITY)
Admission: EM | Admit: 2015-10-28 | Discharge: 2015-10-28 | Disposition: A | Discharge status: Home / Self Care | Payer: Medicare Other | Source: Home / Self Care

## 2015-10-28 ENCOUNTER — Inpatient Hospital Stay (HOSPITAL_COMMUNITY)
Admission: EM | Admit: 2015-10-28 | Discharge: 2015-11-26 | DRG: 270 | Disposition: A | Discharge status: Skilled Nursing Facility | Payer: Medicare Other | Attending: Internal Medicine | Admitting: Internal Medicine

## 2015-10-28 ENCOUNTER — Emergency Department (HOSPITAL_COMMUNITY): Payer: Medicare Other

## 2015-10-28 DIAGNOSIS — M79605 Pain in left leg: Secondary | ICD-10-CM | POA: Diagnosis not present

## 2015-10-28 DIAGNOSIS — Z5321 Procedure and treatment not carried out due to patient leaving prior to being seen by health care provider: Secondary | ICD-10-CM

## 2015-10-28 DIAGNOSIS — E1142 Type 2 diabetes mellitus with diabetic polyneuropathy: Secondary | ICD-10-CM | POA: Diagnosis not present

## 2015-10-28 DIAGNOSIS — E11628 Type 2 diabetes mellitus with other skin complications: Secondary | ICD-10-CM | POA: Diagnosis present

## 2015-10-28 DIAGNOSIS — E46 Unspecified protein-calorie malnutrition: Secondary | ICD-10-CM | POA: Diagnosis present

## 2015-10-28 DIAGNOSIS — E11622 Type 2 diabetes mellitus with other skin ulcer: Secondary | ICD-10-CM | POA: Diagnosis present

## 2015-10-28 DIAGNOSIS — N185 Chronic kidney disease, stage 5: Secondary | ICD-10-CM | POA: Diagnosis not present

## 2015-10-28 DIAGNOSIS — Z794 Long term (current) use of insulin: Secondary | ICD-10-CM | POA: Insufficient documentation

## 2015-10-28 DIAGNOSIS — Z01810 Encounter for preprocedural cardiovascular examination: Secondary | ICD-10-CM | POA: Diagnosis not present

## 2015-10-28 DIAGNOSIS — E1122 Type 2 diabetes mellitus with diabetic chronic kidney disease: Secondary | ICD-10-CM | POA: Diagnosis present

## 2015-10-28 DIAGNOSIS — L97529 Non-pressure chronic ulcer of other part of left foot with unspecified severity: Secondary | ICD-10-CM

## 2015-10-28 DIAGNOSIS — N179 Acute kidney failure, unspecified: Secondary | ICD-10-CM | POA: Diagnosis not present

## 2015-10-28 DIAGNOSIS — T8619 Other complication of kidney transplant: Secondary | ICD-10-CM | POA: Diagnosis present

## 2015-10-28 DIAGNOSIS — I96 Gangrene, not elsewhere classified: Secondary | ICD-10-CM

## 2015-10-28 DIAGNOSIS — N19 Unspecified kidney failure: Secondary | ICD-10-CM | POA: Diagnosis not present

## 2015-10-28 DIAGNOSIS — Z992 Dependence on renal dialysis: Secondary | ICD-10-CM

## 2015-10-28 DIAGNOSIS — I132 Hypertensive heart and chronic kidney disease with heart failure and with stage 5 chronic kidney disease, or end stage renal disease: Secondary | ICD-10-CM | POA: Diagnosis present

## 2015-10-28 DIAGNOSIS — Z452 Encounter for adjustment and management of vascular access device: Secondary | ICD-10-CM | POA: Diagnosis not present

## 2015-10-28 DIAGNOSIS — R296 Repeated falls: Secondary | ICD-10-CM | POA: Diagnosis not present

## 2015-10-28 DIAGNOSIS — Z79899 Other long term (current) drug therapy: Secondary | ICD-10-CM

## 2015-10-28 DIAGNOSIS — Z466 Encounter for fitting and adjustment of urinary device: Secondary | ICD-10-CM | POA: Diagnosis not present

## 2015-10-28 DIAGNOSIS — L03032 Cellulitis of left toe: Secondary | ICD-10-CM | POA: Diagnosis present

## 2015-10-28 DIAGNOSIS — E8770 Fluid overload, unspecified: Secondary | ICD-10-CM | POA: Diagnosis not present

## 2015-10-28 DIAGNOSIS — Y830 Surgical operation with transplant of whole organ as the cause of abnormal reaction of the patient, or of later complication, without mention of misadventure at the time of the procedure: Secondary | ICD-10-CM | POA: Diagnosis present

## 2015-10-28 DIAGNOSIS — K921 Melena: Secondary | ICD-10-CM | POA: Diagnosis not present

## 2015-10-28 DIAGNOSIS — I10 Essential (primary) hypertension: Secondary | ICD-10-CM

## 2015-10-28 DIAGNOSIS — Z89422 Acquired absence of other left toe(s): Secondary | ICD-10-CM

## 2015-10-28 DIAGNOSIS — F0390 Unspecified dementia without behavioral disturbance: Secondary | ICD-10-CM | POA: Diagnosis present

## 2015-10-28 DIAGNOSIS — M109 Gout, unspecified: Secondary | ICD-10-CM | POA: Diagnosis present

## 2015-10-28 DIAGNOSIS — E1152 Type 2 diabetes mellitus with diabetic peripheral angiopathy with gangrene: Principal | ICD-10-CM | POA: Diagnosis present

## 2015-10-28 DIAGNOSIS — Z89412 Acquired absence of left great toe: Secondary | ICD-10-CM

## 2015-10-28 DIAGNOSIS — R031 Nonspecific low blood-pressure reading: Secondary | ICD-10-CM | POA: Diagnosis not present

## 2015-10-28 DIAGNOSIS — E11621 Type 2 diabetes mellitus with foot ulcer: Secondary | ICD-10-CM | POA: Diagnosis present

## 2015-10-28 DIAGNOSIS — Z7952 Long term (current) use of systemic steroids: Secondary | ICD-10-CM

## 2015-10-28 DIAGNOSIS — R339 Retention of urine, unspecified: Secondary | ICD-10-CM | POA: Diagnosis not present

## 2015-10-28 DIAGNOSIS — M79604 Pain in right leg: Secondary | ICD-10-CM

## 2015-10-28 DIAGNOSIS — N184 Chronic kidney disease, stage 4 (severe): Secondary | ICD-10-CM | POA: Diagnosis present

## 2015-10-28 DIAGNOSIS — L089 Local infection of the skin and subcutaneous tissue, unspecified: Secondary | ICD-10-CM | POA: Insufficient documentation

## 2015-10-28 DIAGNOSIS — L97523 Non-pressure chronic ulcer of other part of left foot with necrosis of muscle: Secondary | ICD-10-CM | POA: Diagnosis not present

## 2015-10-28 DIAGNOSIS — IMO0002 Reserved for concepts with insufficient information to code with codable children: Secondary | ICD-10-CM | POA: Diagnosis present

## 2015-10-28 DIAGNOSIS — E785 Hyperlipidemia, unspecified: Secondary | ICD-10-CM | POA: Diagnosis not present

## 2015-10-28 DIAGNOSIS — Z6832 Body mass index (BMI) 32.0-32.9, adult: Secondary | ICD-10-CM

## 2015-10-28 DIAGNOSIS — R0602 Shortness of breath: Secondary | ICD-10-CM

## 2015-10-28 DIAGNOSIS — Y93E1 Activity, personal bathing and showering: Secondary | ICD-10-CM

## 2015-10-28 DIAGNOSIS — R1312 Dysphagia, oropharyngeal phase: Secondary | ICD-10-CM | POA: Diagnosis not present

## 2015-10-28 DIAGNOSIS — M869 Osteomyelitis, unspecified: Secondary | ICD-10-CM | POA: Diagnosis not present

## 2015-10-28 DIAGNOSIS — I480 Paroxysmal atrial fibrillation: Secondary | ICD-10-CM | POA: Diagnosis present

## 2015-10-28 DIAGNOSIS — I4891 Unspecified atrial fibrillation: Secondary | ICD-10-CM | POA: Diagnosis not present

## 2015-10-28 DIAGNOSIS — Z85820 Personal history of malignant melanoma of skin: Secondary | ICD-10-CM

## 2015-10-28 DIAGNOSIS — S301XXA Contusion of abdominal wall, initial encounter: Secondary | ICD-10-CM | POA: Diagnosis not present

## 2015-10-28 DIAGNOSIS — I272 Other secondary pulmonary hypertension: Secondary | ICD-10-CM | POA: Diagnosis present

## 2015-10-28 DIAGNOSIS — Z6833 Body mass index (BMI) 33.0-33.9, adult: Secondary | ICD-10-CM | POA: Diagnosis not present

## 2015-10-28 DIAGNOSIS — K5900 Constipation, unspecified: Secondary | ICD-10-CM | POA: Diagnosis not present

## 2015-10-28 DIAGNOSIS — R41841 Cognitive communication deficit: Secondary | ICD-10-CM | POA: Diagnosis not present

## 2015-10-28 DIAGNOSIS — Z7901 Long term (current) use of anticoagulants: Secondary | ICD-10-CM | POA: Diagnosis not present

## 2015-10-28 DIAGNOSIS — I482 Chronic atrial fibrillation: Secondary | ICD-10-CM | POA: Diagnosis present

## 2015-10-28 DIAGNOSIS — M7989 Other specified soft tissue disorders: Secondary | ICD-10-CM | POA: Diagnosis not present

## 2015-10-28 DIAGNOSIS — R2681 Unsteadiness on feet: Secondary | ICD-10-CM | POA: Diagnosis not present

## 2015-10-28 DIAGNOSIS — Z940 Kidney transplant status: Secondary | ICD-10-CM | POA: Diagnosis not present

## 2015-10-28 DIAGNOSIS — E119 Type 2 diabetes mellitus without complications: Secondary | ICD-10-CM | POA: Insufficient documentation

## 2015-10-28 DIAGNOSIS — E118 Type 2 diabetes mellitus with unspecified complications: Secondary | ICD-10-CM | POA: Diagnosis present

## 2015-10-28 DIAGNOSIS — T17998A Other foreign object in respiratory tract, part unspecified causing other injury, initial encounter: Secondary | ICD-10-CM | POA: Diagnosis not present

## 2015-10-28 DIAGNOSIS — T17998S Other foreign object in respiratory tract, part unspecified causing other injury, sequela: Secondary | ICD-10-CM | POA: Diagnosis not present

## 2015-10-28 DIAGNOSIS — N183 Chronic kidney disease, stage 3 (moderate): Secondary | ICD-10-CM | POA: Diagnosis not present

## 2015-10-28 DIAGNOSIS — E039 Hypothyroidism, unspecified: Secondary | ICD-10-CM | POA: Diagnosis not present

## 2015-10-28 DIAGNOSIS — Z9641 Presence of insulin pump (external) (internal): Secondary | ICD-10-CM | POA: Diagnosis present

## 2015-10-28 DIAGNOSIS — I4821 Permanent atrial fibrillation: Secondary | ICD-10-CM

## 2015-10-28 DIAGNOSIS — E1165 Type 2 diabetes mellitus with hyperglycemia: Secondary | ICD-10-CM | POA: Diagnosis present

## 2015-10-28 DIAGNOSIS — E669 Obesity, unspecified: Secondary | ICD-10-CM | POA: Diagnosis present

## 2015-10-28 DIAGNOSIS — R0989 Other specified symptoms and signs involving the circulatory and respiratory systems: Secondary | ICD-10-CM | POA: Diagnosis not present

## 2015-10-28 DIAGNOSIS — W182XXA Fall in (into) shower or empty bathtub, initial encounter: Secondary | ICD-10-CM | POA: Diagnosis present

## 2015-10-28 DIAGNOSIS — E782 Mixed hyperlipidemia: Secondary | ICD-10-CM | POA: Diagnosis not present

## 2015-10-28 DIAGNOSIS — M79675 Pain in left toe(s): Secondary | ICD-10-CM | POA: Diagnosis not present

## 2015-10-28 DIAGNOSIS — Z8614 Personal history of Methicillin resistant Staphylococcus aureus infection: Secondary | ICD-10-CM

## 2015-10-28 DIAGNOSIS — N2581 Secondary hyperparathyroidism of renal origin: Secondary | ICD-10-CM | POA: Diagnosis not present

## 2015-10-28 DIAGNOSIS — I447 Left bundle-branch block, unspecified: Secondary | ICD-10-CM | POA: Diagnosis not present

## 2015-10-28 DIAGNOSIS — L97509 Non-pressure chronic ulcer of other part of unspecified foot with unspecified severity: Secondary | ICD-10-CM | POA: Diagnosis not present

## 2015-10-28 DIAGNOSIS — I5033 Acute on chronic diastolic (congestive) heart failure: Secondary | ICD-10-CM | POA: Diagnosis present

## 2015-10-28 DIAGNOSIS — T17998D Other foreign object in respiratory tract, part unspecified causing other injury, subsequent encounter: Secondary | ICD-10-CM | POA: Diagnosis not present

## 2015-10-28 DIAGNOSIS — E1129 Type 2 diabetes mellitus with other diabetic kidney complication: Secondary | ICD-10-CM | POA: Diagnosis not present

## 2015-10-28 DIAGNOSIS — S98119A Complete traumatic amputation of unspecified great toe, initial encounter: Secondary | ICD-10-CM | POA: Diagnosis not present

## 2015-10-28 DIAGNOSIS — Z419 Encounter for procedure for purposes other than remedying health state, unspecified: Secondary | ICD-10-CM

## 2015-10-28 DIAGNOSIS — S98112D Complete traumatic amputation of left great toe, subsequent encounter: Secondary | ICD-10-CM | POA: Diagnosis not present

## 2015-10-28 DIAGNOSIS — Z4901 Encounter for fitting and adjustment of extracorporeal dialysis catheter: Secondary | ICD-10-CM | POA: Diagnosis not present

## 2015-10-28 DIAGNOSIS — D638 Anemia in other chronic diseases classified elsewhere: Secondary | ICD-10-CM | POA: Diagnosis present

## 2015-10-28 DIAGNOSIS — M6281 Muscle weakness (generalized): Secondary | ICD-10-CM | POA: Diagnosis not present

## 2015-10-28 DIAGNOSIS — Z9849 Cataract extraction status, unspecified eye: Secondary | ICD-10-CM

## 2015-10-28 DIAGNOSIS — I509 Heart failure, unspecified: Secondary | ICD-10-CM

## 2015-10-28 DIAGNOSIS — N186 End stage renal disease: Secondary | ICD-10-CM | POA: Diagnosis present

## 2015-10-28 DIAGNOSIS — J9811 Atelectasis: Secondary | ICD-10-CM | POA: Diagnosis not present

## 2015-10-28 DIAGNOSIS — Z91048 Other nonmedicinal substance allergy status: Secondary | ICD-10-CM

## 2015-10-28 DIAGNOSIS — E11649 Type 2 diabetes mellitus with hypoglycemia without coma: Secondary | ICD-10-CM | POA: Diagnosis not present

## 2015-10-28 DIAGNOSIS — R338 Other retention of urine: Secondary | ICD-10-CM | POA: Diagnosis not present

## 2015-10-28 DIAGNOSIS — M87878 Other osteonecrosis, left toe(s): Secondary | ICD-10-CM | POA: Diagnosis not present

## 2015-10-28 DIAGNOSIS — R278 Other lack of coordination: Secondary | ICD-10-CM | POA: Diagnosis not present

## 2015-10-28 DIAGNOSIS — Z6835 Body mass index (BMI) 35.0-35.9, adult: Secondary | ICD-10-CM

## 2015-10-28 DIAGNOSIS — R0682 Tachypnea, not elsewhere classified: Secondary | ICD-10-CM

## 2015-10-28 DIAGNOSIS — I70245 Atherosclerosis of native arteries of left leg with ulceration of other part of foot: Secondary | ICD-10-CM | POA: Diagnosis not present

## 2015-10-28 DIAGNOSIS — I739 Peripheral vascular disease, unspecified: Secondary | ICD-10-CM | POA: Diagnosis not present

## 2015-10-28 DIAGNOSIS — R0600 Dyspnea, unspecified: Secondary | ICD-10-CM

## 2015-10-28 DIAGNOSIS — Z95828 Presence of other vascular implants and grafts: Secondary | ICD-10-CM

## 2015-10-28 DIAGNOSIS — I70262 Atherosclerosis of native arteries of extremities with gangrene, left leg: Secondary | ICD-10-CM | POA: Diagnosis not present

## 2015-10-28 LAB — CBC WITH DIFFERENTIAL/PLATELET
Basophils Absolute: 0 10*3/uL (ref 0.0–0.1)
Basophils Relative: 0 %
EOS PCT: 0 %
Eosinophils Absolute: 0 10*3/uL (ref 0.0–0.7)
HEMATOCRIT: 44.9 % (ref 39.0–52.0)
HEMOGLOBIN: 15 g/dL (ref 13.0–17.0)
LYMPHS PCT: 13 %
Lymphs Abs: 1.9 10*3/uL (ref 0.7–4.0)
MCH: 31.1 pg (ref 26.0–34.0)
MCHC: 33.4 g/dL (ref 30.0–36.0)
MCV: 93.2 fL (ref 78.0–100.0)
MONOS PCT: 6 %
Monocytes Absolute: 0.9 10*3/uL (ref 0.1–1.0)
NEUTROS PCT: 81 %
Neutro Abs: 11.5 10*3/uL — ABNORMAL HIGH (ref 1.7–7.7)
Platelets: 327 10*3/uL (ref 150–400)
RBC: 4.82 MIL/uL (ref 4.22–5.81)
RDW: 14.3 % (ref 11.5–15.5)
WBC: 14.3 10*3/uL — AB (ref 4.0–10.5)

## 2015-10-28 LAB — COMPREHENSIVE METABOLIC PANEL
ALK PHOS: 60 U/L (ref 38–126)
ALT: 15 U/L — AB (ref 17–63)
AST: 32 U/L (ref 15–41)
Albumin: 2.6 g/dL — ABNORMAL LOW (ref 3.5–5.0)
Anion gap: 11 (ref 5–15)
BUN: 34 mg/dL — AB (ref 6–20)
CALCIUM: 8.7 mg/dL — AB (ref 8.9–10.3)
CO2: 27 mmol/L (ref 22–32)
CREATININE: 2.6 mg/dL — AB (ref 0.61–1.24)
Chloride: 96 mmol/L — ABNORMAL LOW (ref 101–111)
GFR, EST AFRICAN AMERICAN: 26 mL/min — AB (ref >60)
GFR, EST NON AFRICAN AMERICAN: 23 mL/min — AB (ref >60)
Glucose, Bld: 230 mg/dL — ABNORMAL HIGH (ref 65–99)
Potassium: 4.1 mmol/L (ref 3.5–5.1)
Sodium: 134 mmol/L — ABNORMAL LOW (ref 135–145)
Total Bilirubin: 1.7 mg/dL — ABNORMAL HIGH (ref 0.3–1.2)
Total Protein: 6.7 g/dL (ref 6.5–8.1)

## 2015-10-28 LAB — PROTIME-INR
INR: 2.68 — ABNORMAL HIGH (ref 0.00–1.49)
Prothrombin Time: 28.1 seconds — ABNORMAL HIGH (ref 11.6–15.2)

## 2015-10-28 LAB — I-STAT CG4 LACTIC ACID, ED: LACTIC ACID, VENOUS: 3.47 mmol/L — AB (ref 0.5–1.9)

## 2015-10-28 LAB — CBG MONITORING, ED
Glucose-Capillary: 262 mg/dL — ABNORMAL HIGH (ref 65–99)
Glucose-Capillary: 182 mg/dL — ABNORMAL HIGH (ref 65–99)

## 2015-10-28 LAB — PREALBUMIN: Prealbumin: 9.7 mg/dL — ABNORMAL LOW (ref 18–38)

## 2015-10-28 MED ORDER — CALCITRIOL 0.5 MCG PO CAPS
0.5000 ug | ORAL_CAPSULE | Freq: Every day | ORAL | Status: DC
  Administered 2015-10-29 – 2015-11-24 (×25): 0.5 ug via ORAL
  Filled 2015-10-28 (×25): qty 1

## 2015-10-28 MED ORDER — PREDNISONE 5 MG PO TABS
5.0000 mg | ORAL_TABLET | Freq: Every day | ORAL | Status: DC
  Administered 2015-10-29 – 2015-11-11 (×13): 5 mg via ORAL
  Filled 2015-10-28 (×13): qty 1

## 2015-10-28 MED ORDER — POTASSIUM CHLORIDE CRYS ER 20 MEQ PO TBCR
50.0000 meq | EXTENDED_RELEASE_TABLET | Freq: Two times a day (BID) | ORAL | Status: DC
  Administered 2015-10-29 (×2): 50 meq via ORAL
  Filled 2015-10-28 (×2): qty 3

## 2015-10-28 MED ORDER — SODIUM CHLORIDE 0.9 % IV BOLUS (SEPSIS)
1000.0000 mL | Freq: Once | INTRAVENOUS | Status: AC
  Administered 2015-10-28: 1000 mL via INTRAVENOUS

## 2015-10-28 MED ORDER — INSULIN ASPART 100 UNIT/ML ~~LOC~~ SOLN
4.0000 [IU] | Freq: Three times a day (TID) | SUBCUTANEOUS | Status: DC
  Administered 2015-10-29 – 2015-10-30 (×3): 4 [IU] via SUBCUTANEOUS

## 2015-10-28 MED ORDER — LEVOTHYROXINE SODIUM 25 MCG PO TABS
137.0000 ug | ORAL_TABLET | Freq: Every day | ORAL | Status: DC
  Administered 2015-10-30 – 2015-11-26 (×25): 137 ug via ORAL
  Filled 2015-10-28 (×27): qty 1

## 2015-10-28 MED ORDER — VANCOMYCIN HCL 10 G IV SOLR
2500.0000 mg | Freq: Once | INTRAVENOUS | Status: AC
  Administered 2015-10-28: 2500 mg via INTRAVENOUS
  Filled 2015-10-28: qty 2500

## 2015-10-28 MED ORDER — INSULIN GLARGINE 100 UNIT/ML ~~LOC~~ SOLN
30.0000 [IU] | Freq: Every day | SUBCUTANEOUS | Status: DC
  Administered 2015-10-29 (×2): 30 [IU] via SUBCUTANEOUS
  Filled 2015-10-28 (×3): qty 0.3

## 2015-10-28 MED ORDER — PANTOPRAZOLE SODIUM 40 MG PO TBEC
40.0000 mg | DELAYED_RELEASE_TABLET | Freq: Every day | ORAL | Status: DC
  Administered 2015-10-29: 40 mg via ORAL
  Filled 2015-10-28: qty 1

## 2015-10-28 MED ORDER — INSULIN PUMP: Freq: Three times a day (TID) | SUBCUTANEOUS | Status: DC

## 2015-10-28 MED ORDER — ROSUVASTATIN CALCIUM 10 MG PO TABS
10.0000 mg | ORAL_TABLET | Freq: Every evening | ORAL | Status: DC
  Administered 2015-10-29 – 2015-11-25 (×26): 10 mg via ORAL
  Filled 2015-10-28 (×26): qty 1

## 2015-10-28 MED ORDER — K PHOS MONO-SOD PHOS DI & MONO 155-852-130 MG PO TABS
250.0000 mg | ORAL_TABLET | Freq: Two times a day (BID) | ORAL | Status: DC
  Administered 2015-10-29 – 2015-11-11 (×26): 250 mg via ORAL
  Filled 2015-10-28 (×30): qty 1

## 2015-10-28 MED ORDER — ACETAMINOPHEN 500 MG PO TABS
500.0000 mg | ORAL_TABLET | Freq: Four times a day (QID) | ORAL | Status: DC | PRN
  Administered 2015-10-30 – 2015-11-08 (×15): 500 mg via ORAL
  Filled 2015-10-28 (×14): qty 1

## 2015-10-28 MED ORDER — FUROSEMIDE 20 MG PO TABS
200.0000 mg | ORAL_TABLET | Freq: Two times a day (BID) | ORAL | Status: DC
  Filled 2015-10-28: qty 10

## 2015-10-28 MED ORDER — INSULIN ASPART 100 UNIT/ML ~~LOC~~ SOLN
0.0000 [IU] | Freq: Three times a day (TID) | SUBCUTANEOUS | Status: DC
  Administered 2015-10-29 – 2015-11-01 (×9): 3 [IU] via SUBCUTANEOUS
  Administered 2015-11-02 – 2015-11-04 (×4): 2 [IU] via SUBCUTANEOUS
  Administered 2015-11-04 – 2015-11-05 (×2): 3 [IU] via SUBCUTANEOUS
  Administered 2015-11-07: 2 [IU] via SUBCUTANEOUS
  Administered 2015-11-07: 8 [IU] via SUBCUTANEOUS
  Administered 2015-11-07: 3 [IU] via SUBCUTANEOUS
  Administered 2015-11-08: 2 [IU] via SUBCUTANEOUS
  Administered 2015-11-08: 3 [IU] via SUBCUTANEOUS
  Administered 2015-11-09: 5 [IU] via SUBCUTANEOUS
  Administered 2015-11-09: 2 [IU] via SUBCUTANEOUS
  Administered 2015-11-10: 3 [IU] via SUBCUTANEOUS
  Administered 2015-11-10 (×2): 5 [IU] via SUBCUTANEOUS
  Administered 2015-11-11: 2 [IU] via SUBCUTANEOUS
  Administered 2015-11-11: 3 [IU] via SUBCUTANEOUS
  Administered 2015-11-11: 2 [IU] via SUBCUTANEOUS
  Administered 2015-11-12 (×2): 3 [IU] via SUBCUTANEOUS
  Administered 2015-11-13 (×2): 8 [IU] via SUBCUTANEOUS
  Administered 2015-11-14 – 2015-11-15 (×4): 3 [IU] via SUBCUTANEOUS
  Administered 2015-11-16 (×2): 2 [IU] via SUBCUTANEOUS
  Administered 2015-11-16: 3 [IU] via SUBCUTANEOUS
  Administered 2015-11-17: 2 [IU] via SUBCUTANEOUS
  Administered 2015-11-17 (×2): 5 [IU] via SUBCUTANEOUS
  Administered 2015-11-18: 3 [IU] via SUBCUTANEOUS
  Administered 2015-11-19: 8 [IU] via SUBCUTANEOUS
  Administered 2015-11-20: 3 [IU] via SUBCUTANEOUS
  Administered 2015-11-20: 2 [IU] via SUBCUTANEOUS
  Administered 2015-11-21: 3 [IU] via SUBCUTANEOUS
  Administered 2015-11-21: 5 [IU] via SUBCUTANEOUS
  Administered 2015-11-22: 3 [IU] via SUBCUTANEOUS
  Administered 2015-11-22: 2 [IU] via SUBCUTANEOUS
  Administered 2015-11-23: 5 [IU] via SUBCUTANEOUS
  Administered 2015-11-23: 3 [IU] via SUBCUTANEOUS
  Administered 2015-11-24: 5 [IU] via SUBCUTANEOUS
  Administered 2015-11-24: 2 [IU] via SUBCUTANEOUS
  Administered 2015-11-24: 5 [IU] via SUBCUTANEOUS
  Administered 2015-11-25: 3 [IU] via SUBCUTANEOUS
  Administered 2015-11-25 – 2015-11-26 (×2): 2 [IU] via SUBCUTANEOUS

## 2015-10-28 MED ORDER — MAGNESIUM OXIDE 400 (241.3 MG) MG PO TABS
400.0000 mg | ORAL_TABLET | Freq: Two times a day (BID) | ORAL | Status: DC
  Administered 2015-10-29 – 2015-11-24 (×51): 400 mg via ORAL
  Filled 2015-10-28 (×51): qty 1

## 2015-10-28 MED ORDER — DIGOXIN 125 MCG PO TABS
0.1250 mg | ORAL_TABLET | Freq: Every day | ORAL | Status: DC
  Administered 2015-10-29 – 2015-11-05 (×8): 0.125 mg via ORAL
  Filled 2015-10-28 (×9): qty 1

## 2015-10-28 MED ORDER — VANCOMYCIN HCL IN DEXTROSE 1-5 GM/200ML-% IV SOLN
1000.0000 mg | INTRAVENOUS | Status: DC
  Administered 2015-10-29 – 2015-11-06 (×9): 1000 mg via INTRAVENOUS
  Filled 2015-10-28 (×10): qty 200

## 2015-10-28 MED ORDER — PIPERACILLIN-TAZOBACTAM 3.375 G IVPB
3.3750 g | Freq: Three times a day (TID) | INTRAVENOUS | Status: DC
  Administered 2015-10-28 – 2015-11-09 (×34): 3.375 g via INTRAVENOUS
  Filled 2015-10-28 (×42): qty 50

## 2015-10-28 MED ORDER — ALLOPURINOL 300 MG PO TABS
150.0000 mg | ORAL_TABLET | Freq: Every day | ORAL | Status: DC
  Administered 2015-10-29 – 2015-11-26 (×27): 150 mg via ORAL
  Filled 2015-10-28 (×3): qty 1
  Filled 2015-10-28: qty 2
  Filled 2015-10-28 (×5): qty 1
  Filled 2015-10-28: qty 2
  Filled 2015-10-28: qty 1
  Filled 2015-10-28 (×2): qty 2
  Filled 2015-10-28 (×3): qty 1
  Filled 2015-10-28: qty 2
  Filled 2015-10-28: qty 1
  Filled 2015-10-28 (×2): qty 2
  Filled 2015-10-28 (×3): qty 1
  Filled 2015-10-28: qty 2
  Filled 2015-10-28 (×2): qty 1

## 2015-10-28 MED ORDER — TACROLIMUS 0.5 MG PO CAPS
0.5000 mg | ORAL_CAPSULE | Freq: Two times a day (BID) | ORAL | Status: DC
  Administered 2015-10-29 – 2015-10-30 (×4): 0.5 mg via ORAL
  Filled 2015-10-28 (×5): qty 1

## 2015-10-28 MED ORDER — METOPROLOL SUCCINATE ER 25 MG PO TB24
50.0000 mg | ORAL_TABLET | Freq: Every day | ORAL | Status: DC
  Administered 2015-10-29 – 2015-10-30 (×2): 50 mg via ORAL
  Filled 2015-10-28 (×2): qty 2

## 2015-10-28 NOTE — ED Triage Notes (Signed)
Pt sts left great toe wound and is diabetic; pt sts wound x 1 month

## 2015-10-28 NOTE — ED Triage Notes (Signed)
Patient sent by PCP for toe infection and big left toe is black.  Patient hit toe couple weeks ago and has ben taking care of it at home.  Patient was wanting to go wound care clinic that he has seen before for another wound but not scheduled until this coming Thursday.  Patient went to PCP and sent here.

## 2015-10-28 NOTE — H&P (Signed)
History and Physical    Mark [name] MWN:[id number] DOB: 01-Mar-1941 DOA: 10/28/2015   PCP: Rochel Brome, MD Chief Complaint:  Chief Complaint  Patient presents with  . Toe Pain    HPI: Mark [name] is a 75 y.o. 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  Patient presents to ED with c/o 'his toe is dead'.  This onset about 1 month ago.  Finally saw doctor today after he fell getting out of the shower this morning.  His doctor was concerned about infection and sent him to the ED.  No fever no vomiting.  ED Course: See physical exam below for pictures.  Toe is grossly infected and necrotic and his family member's description of 'his toe is dead' seems likely accurate.  Review of Systems: As per HPI otherwise 10 point review of systems negative.    Past Medical History:  Diagnosis Date  . Atrial fibrillation ([place])   . Diabetes ([place])   . Gout   . Hypertension   . Hypothyroidism   . Renal disorder    kidney transplant   . Ulcer of other part of foot 12/15/2013    Past Surgical History:  Procedure Laterality Date  . ANKLE FRACTURE SURGERY    . CATARACT EXTRACTION    . MELANOMA EXCISION    . NEPHRECTOMY TRANSPLANTED ORGAN       reports that he has never smoked. He has never used smokeless tobacco. He reports that he does not drink alcohol or use drugs.  Allergies  Allergen Reactions  . Tape Other (See Comments)    SKIN WILL TEAR!!    Family History  Problem Relation Age of Onset  . Bradycardia Mother   . Stroke Father      Prior to Admission medications   Medication Sig Start Date End Date Taking? Authorizing Provider  allopurinol (ZYLOPRIM) 300 MG tablet TAKE 1/2 OF A TABLET BY MOUTH ONCE DAILY. Patient taking differently: Take 150 mg by mouth in the morning 07/04/15  Yes Elayne Snare, MD  BD PEN NEEDLE NANO U/F 32G X 4 MM MISC USE AS DIRECTED 5 TIMES DAILY SUBQUE. 04/12/15  Yes Elayne Snare, MD    fenofibrate micronized (LOFIBRA) 134 MG capsule TAKE ONE CAPSULE EVERY DAY WITH A MEAL Patient taking differently: Take 1 capsule by mouth in the morning on Mon/Wed/Fri 07/11/15  Yes Elayne Snare, MD  levothyroxine (SYNTHROID, LEVOTHROID) 88 MCG tablet TAKE ONE TABLET EVERY DAY IN THE MORNING ON EMPTY STOMACH 08/22/15  Yes Elayne Snare, MD  [facility] DELICA LANCETS FINE MISC Use to check blood sugar 4 times per day dx code 250.02 12/19/13  Yes Elayne Snare, MD  calcitRIOL (ROCALTROL) 0.5 MCG capsule Take one by mouth daily 06/18/12   Historical Provider, MD  [place] 0.125 MG tablet Take one by mouth daily 06/18/12   Historical Provider, MD  furosemide (LASIX) 80 MG tablet Take 2 tabs by mouth every am and 1 every pm 06/18/12   Historical Provider, MD  HYDROcodone-acetaminophen (NORCO/VICODIN) 5-325 MG per tablet Take 1 by mouth every 4-6 hours as needed 06/03/12   Historical Provider, MD  Insulin Disposable Pump (V-GO 40) KIT Use one per day 10/23/15   Elayne Snare, MD  insulin lispro (HUMALOG) 100 UNIT/ML injection Use max 78 units per day with V-go pump 08/19/15   Elayne Snare, MD  insulin regular human CONCENTRATED (HUMULIN R) 500 UNIT/ML injection Use max 56 units per day with  V-Go 07/15/15   Elayne Snare, MD  Insulin Syringe-Needle U-100 (INSULIN SYRINGE .3CC/31GX5/16") 31G X 5/16" 0.3 ML MISC Use as directed to inject insulin 06/26/13   Elayne Snare, MD  ketoconazole (NIZORAL) 2 % shampoo  12/14/13   Historical Provider, MD  metolazone (ZAROXOLYN) 2.5 MG tablet  12/26/13   Historical Provider, MD  metoprolol succinate (TOPROL-XL) 50 MG 24 hr tablet Take one by mouth daily 06/18/12   Historical Provider, MD  mupirocin ointment (BACTROBAN) 2 %  02/27/14   Historical Provider, MD  nystatin (MYCOSTATIN/NYSTOP) 100000 UNIT/GM POWD  12/14/12   Historical Provider, MD  omeprazole (PRILOSEC) 20 MG capsule Take 20 mg by mouth daily.    Historical Provider, MD  [facility] VERIO test strip USE AS INSTRUCTED TO CHECK BLOOD SUGAR 4 TIMES  A DAY 03/22/15   Elayne Snare, MD  oxyCODONE-acetaminophen (PERCOCET) 7.5-325 MG per tablet Take 1 tablet by mouth. 06/13/12   Historical Provider, MD  PHOSPHA 250 NEUTRAL [phone] MG tablet  12/30/13   Historical Provider, MD  potassium chloride SA (K-DUR,KLOR-CON) 20 MEQ tablet Take 20 mEq by mouth 2 (two) times daily. Takes 2 tablets in am and 3 in pm 05/28/12   Historical Provider, MD  predniSONE (DELTASONE) 5 MG tablet Take 5 mg by mouth daily. 03/15/14   Historical Provider, MD  rosuvastatin (CRESTOR) 5 MG tablet TAKE ONE TABLET BY MOUTH ONCE DAILY 09/05/15   Elayne Snare, MD  SSD 1 % cream Apply to affected area twice a day 06/14/12   Historical Provider, MD  tacrolimus (PROGRAF) 0.5 MG capsule Take 0.5 mg by mouth 2 (two) times daily.    Historical Provider, MD  warfarin (COUMADIN) 5 MG tablet Take one by mouth daily 05/30/12   Historical Provider, MD    Physical Exam: Vitals:   10/28/15 2017 10/28/15 2030 10/28/15 2100 10/28/15 2115  BP: 157/89 (!) 179/38 (!) 175/101 170/70  Pulse: 76 73 90 68  Resp: 16     Temp: 97.6 F (36.4 C)     TempSrc: Oral     SpO2: 98% 100% 99% 100%  Weight: 103.4 kg (228 lb)     Height: _0  (1.753 m)         Constitutional: NAD, calm, comfortable Eyes: PERRL, lids and conjunctivae normal ENMT: Mucous membranes are moist. Posterior pharynx clear of any exudate or lesions.Normal dentition.  Neck: normal, supple, no masses, no thyromegaly Respiratory: clear to auscultation bilaterally, no wheezing, no crackles. Normal respiratory effort. No accessory muscle use.  Cardiovascular: Regular rate and rhythm, no murmurs / rubs / gallops. No extremity edema. 2+ pedal pulses. No carotid bruits.  Abdomen: no tenderness, no masses palpated. No hepatosplenomegaly. Bowel sounds positive.  Musculoskeletal: no clubbing / cyanosis. No joint deformity upper and lower extremities. Good ROM, no contractures. Normal muscle tone.  Skin:    Neurologic: CN 2-12 grossly  intact. Sensation intact, DTR normal. Strength 5/5 in all 4.  Psychiatric: Normal judgment and insight. Alert and oriented x 3. Normal mood.    Labs on Admission: I have personally reviewed following labs and imaging studies  CBC:  Recent Labs Lab 10/28/15 1919  WBC 14.3*  NEUTROABS 11.5*  HGB 15.0  HCT 44.9  MCV 93.2  PLT 782   Basic Metabolic Panel:  Recent Labs Lab 10/28/15 1919  NA 134*  K 4.1  CL 96*  CO2 27  GLUCOSE 230*  BUN 34*  CREATININE 2.60*  CALCIUM 8.7*   GFR: Estimated Creatinine Clearance: 29.1 mL/min (  by C-G formula based on SCr of 2.6 mg/dL). Liver Function Tests:  Recent Labs Lab 10/28/15 1919  AST 32  ALT 15*  ALKPHOS 60  BILITOT 1.7*  PROT 6.7  ALBUMIN 2.6*   No results for input(s): LIPASE, AMYLASE in the last 168 hours. No results for input(s): AMMONIA in the last 168 hours. Coagulation Profile: No results for input(s): INR, PROTIME in the last 168 hours. Cardiac Enzymes: No results for input(s): CKTOTAL, CKMB, CKMBINDEX, TROPONINI in the last 168 hours. BNP (last 3 results) No results for input(s): PROBNP in the last 8760 hours. HbA1C: No results for input(s): HGBA1C in the last 72 hours. CBG:  Recent Labs Lab 10/28/15 1317 10/28/15 2211  GLUCAP 262* 182*   Lipid Profile: No results for input(s): CHOL, HDL, LDLCALC, TRIG, CHOLHDL, LDLDIRECT in the last 72 hours. Thyroid Function Tests: No results for input(s): TSH, T4TOTAL, FREET4, T3FREE, THYROIDAB in the last 72 hours. Anemia Panel: No results for input(s): VITAMINB12, FOLATE, FERRITIN, TIBC, IRON, RETICCTPCT in the last 72 hours. Urine analysis:    Component Value Date/Time   COLORURINE LT. YELLOW 10/18/2012 [place] 10/18/2012 0857   LABSPEC 1.015 10/18/2012 0857   PHURINE 6.5 10/18/2012 0857   GLUCOSEU NEGATIVE 10/18/2012 0857   HGBUR SMALL 10/18/2012 0857   BILIRUBINUR NEGATIVE 10/18/2012 0857   KETONESUR NEGATIVE 10/18/2012 0857    UROBILINOGEN 0.2 10/18/2012 0857   NITRITE NEGATIVE 10/18/2012 0857   LEUKOCYTESUR NEGATIVE 10/18/2012 0857   Sepsis Labs: _0 (procalcitonin:4,lacticidven:4) )No results found for this or any previous visit (from the past 240 hour(s)).   Radiological Exams on Admission: Dg Foot Complete Left  Result Date: 10/28/2015 CLINICAL DATA:  Left big toe pain. EXAM: LEFT FOOT - COMPLETE 3+ VIEW COMPARISON:  None. FINDINGS: There is no evidence of fracture or dislocation. No evidence of dystrophic bony changes. There is soft tissue swelling of the distal first digit. Punctate lucencies within the area of soft tissue swelling may represent entrapped fat or potentially foci of gas. Heavy vascular calcifications are seen. IMPRESSION: No evidence of fracture of the left foot. Soft tissue swelling of the distal first digit with punctate lucencies within the area of swelling, which may represent entrapped fat or potentially foci of gas. Please correlate clinically regarding the possibility of cellulitis. Electronically Signed   By: Fidela Salisbury M.D.   On: 10/28/2015 21:01   EKG: Independently reviewed.  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  Diabetic ulcer pathway  Surgery eval, likely needs amputation of toe (see pictures above).  Renal transplant -  BMP repeat in AM, creatinine has been trending up over the past year  Continue anti-rejection meds  DM2 -  Lantus 30  Moderate dose mealtime and SSI AC/HS   DVT prophylaxis: holding coumadin, convert to heparin gtt when subtheraputic Code Status: Full Family  Communication: Family at bedside Consults called: Ortho called by EDP Admission status: Admit to inpatient   Etta Quill DO [facility] Pager [phone] from 7PM-7AM  If 7AM-7PM, please contact the day physician for the patient [email or web address] Password [facility]  10/28/2015, 10:27 PM

## 2015-10-28 NOTE — ED Notes (Signed)
Patient left AMA.

## 2015-10-28 NOTE — ED Notes (Signed)
Pt family member returned labels. Refused to wait

## 2015-10-28 NOTE — ED Triage Notes (Signed)
Per EMS: pt coming from home with c/o necrosed left big toe per PCP, went to [facility], sat in waiting room for three hours, family took him home to call 911, pt transported here. Pt c/o toe problem for 4 weeks.

## 2015-10-28 NOTE — Progress Notes (Signed)
Received report

## 2015-10-28 NOTE — Progress Notes (Addendum)
Pharmacy Antibiotic Note  [name] is a 75 y.o. male admitted on 10/28/2015 with diabetic foot infection.  Pharmacy has been consulted for zosyn and vancomycin dosing.  Plan: Vancomycin 2500 mg x 1 then 1000 mg IV every 24 hours.  Goal trough 15-20 mcg/mL. Zosyn 3.375g IV q8h (4 hour infusion).  Height: 5\' 9"  (175.3 cm) Weight: 228 lb (103.4 kg) IBW/kg (Calculated) : 70.7  Temp (24hrs), Avg:97.8 F (36.6 C), Min:97.6 F (36.4 C), Max:97.9 F (36.6 C)   Recent Labs Lab 10/28/15 1919 10/28/15 1940  WBC 14.3*  --   CREATININE 2.60*  --   LATICACIDVEN  --  3.47*    Estimated Creatinine Clearance: 29.1 mL/min (by C-G formula based on SCr of 2.6 mg/dL).    No Known Allergies  Levester Fresh, PharmD, BCPS, [facility] Clinical Pharmacist Pager [phone] 10/28/2015 9:20 PM

## 2015-10-28 NOTE — ED Provider Notes (Signed)
[facility] DEPT Provider Note   CSN: [id number] Arrival date & time: 10/28/15  4403  First Provider Contact:  First MD Initiated Contact with Patient 10/28/15 2025        History   Chief Complaint Chief Complaint  Patient presents with  . Toe Pain    HPI Mark [name] is a 75 y.o. male.  HPI Pt has history of diabetes.  About 6 weeks ago he thinks he may have stubbed his toe.  About one month ago it started turning black.  This morning he fell getting out of the shower.  He sat down onto the toilet and was able to get himself down. EMS arrived at the house to help him after lying on the floor for a few hours. He saw his doctor today for the first time.  The doctor was concerned about the infection and was sent to the ED.  Denies any fever.  No vomiting.  He denies any pain from the fall in the shower Past Medical History:  Diagnosis Date  . Atrial fibrillation ([place])   . Diabetes ([place])   . Gout   . Hypertension   . Hypothyroidism   . Renal disorder    kidney transplant   . Ulcer of other part of foot 12/15/2013    Patient Active Problem List   Diagnosis Date Noted  . Blister of foot with infection 02/22/2015  . Diabetic peripheral neuropathy ([place]) 06/12/2014  . Hammer toe of right foot 02/16/2014  . Onychomycosis 02/02/2014  . Pain in lower limb 02/02/2014  . Ulcer of right foot ([place]) 01/19/2014  . Ulcer of right ankle ([place]) 01/08/2014  . Ulcer of other part of foot 12/15/2013  . Type II diabetes mellitus, uncontrolled ([place]) 10/20/2012  . Other and unspecified hyperlipidemia 10/20/2012  . Chronic kidney disease, stage III (moderate) 10/20/2012  . Unspecified hypothyroidism 10/20/2012  . Edema extremities 10/20/2012  . Gout 10/20/2012    Past Surgical History:  Procedure Laterality Date  . ANKLE FRACTURE SURGERY    . CATARACT EXTRACTION    . MELANOMA EXCISION    . NEPHRECTOMY TRANSPLANTED ORGAN         Home Medications    Prior to Admission  medications   Medication Sig Start Date End Date Taking? Authorizing Provider  allopurinol (ZYLOPRIM) 300 MG tablet TAKE 1/2 OF A TABLET BY MOUTH ONCE DAILY. 07/04/15   Elayne Snare, MD  BD PEN NEEDLE NANO U/F 32G X 4 MM MISC USE AS DIRECTED 5 TIMES DAILY SUBQUE. 04/12/15   Elayne Snare, MD  calcitRIOL (ROCALTROL) 0.5 MCG capsule Take one by mouth daily 06/18/12   Historical Provider, MD  [place] 0.125 MG tablet Take one by mouth daily 06/18/12   Historical Provider, MD  fenofibrate micronized (LOFIBRA) 134 MG capsule TAKE ONE CAPSULE EVERY DAY WITH A MEAL 07/11/15   Elayne Snare, MD  furosemide (LASIX) 80 MG tablet Take 2 tabs by mouth every am and 1 every pm 06/18/12   Historical Provider, MD  HYDROcodone-acetaminophen (NORCO/VICODIN) 5-325 MG per tablet Take 1 by mouth every 4-6 hours as needed 06/03/12   Historical Provider, MD  Insulin Disposable Pump (V-GO 40) KIT Use one per day 10/23/15   Elayne Snare, MD  insulin lispro (HUMALOG) 100 UNIT/ML injection Use max 78 units per day with V-go pump 08/19/15   Elayne Snare, MD  insulin regular human CONCENTRATED (HUMULIN R) 500 UNIT/ML injection Use max 56 units per day with V-Go 07/15/15   Elayne Snare,  MD  Insulin Syringe-Needle U-100 (INSULIN SYRINGE .3CC/31GX5/16") 31G X 5/16" 0.3 ML MISC Use as directed to inject insulin 06/26/13   Elayne Snare, MD  ketoconazole (NIZORAL) 2 % shampoo  12/14/13   Historical Provider, MD  levothyroxine (SYNTHROID, LEVOTHROID) 88 MCG tablet TAKE ONE TABLET EVERY DAY IN THE MORNING ON EMPTY STOMACH 08/22/15   Elayne Snare, MD  metolazone (ZAROXOLYN) 2.5 MG tablet  12/26/13   Historical Provider, MD  metoprolol succinate (TOPROL-XL) 50 MG 24 hr tablet Take one by mouth daily 06/18/12   Historical Provider, MD  mupirocin ointment (BACTROBAN) 2 %  02/27/14   Historical Provider, MD  nystatin (MYCOSTATIN/NYSTOP) 100000 UNIT/GM POWD  12/14/12   Historical Provider, MD  omeprazole (PRILOSEC) 20 MG capsule Take 20 mg by mouth daily.    Historical  Provider, MD  [facility] DELICA LANCETS FINE MISC Use to check blood sugar 4 times per day dx code 250.02 12/19/13   Elayne Snare, MD  [facility] VERIO test strip USE AS INSTRUCTED TO CHECK BLOOD SUGAR 4 TIMES A DAY 03/22/15   Elayne Snare, MD  oxyCODONE-acetaminophen (PERCOCET) 7.5-325 MG per tablet Take 1 tablet by mouth. 06/13/12   Historical Provider, MD  PHOSPHA 250 NEUTRAL [phone] MG tablet  12/30/13   Historical Provider, MD  potassium chloride SA (K-DUR,KLOR-CON) 20 MEQ tablet Take 20 mEq by mouth 2 (two) times daily. Takes 2 tablets in am and 3 in pm 05/28/12   Historical Provider, MD  predniSONE (DELTASONE) 5 MG tablet Take 5 mg by mouth daily. 03/15/14   Historical Provider, MD  rosuvastatin (CRESTOR) 5 MG tablet TAKE ONE TABLET BY MOUTH ONCE DAILY 09/05/15   Elayne Snare, MD  Sirolimus (RAPAMUNE) 0.5 MG TABS Take by mouth daily.    Historical Provider, MD  SSD 1 % cream Apply to affected area twice a day 06/14/12   Historical Provider, MD  tacrolimus (PROGRAF) 0.5 MG capsule Take 0.5 mg by mouth 2 (two) times daily.    Historical Provider, MD  warfarin (COUMADIN) 5 MG tablet Take one by mouth daily 05/30/12   Historical Provider, MD    Family History History reviewed. No pertinent family history.  Social History Social History  Substance Use Topics  . Smoking status: Never Smoker  . Smokeless tobacco: Never Used  . Alcohol use No     Allergies   Review of patient's allergies indicates no known allergies.   Review of Systems Review of Systems  All other systems reviewed and are negative.    Physical Exam Updated Vital Signs BP 157/89 (BP Location: Right Arm)   Pulse 76   Temp 97.6 F (36.4 C) (Oral)   Resp 16   Ht '5\' 9"'  (1.753 m)   Wt 103.4 kg   SpO2 98%   BMI 33.67 kg/m   Physical Exam  Constitutional: No distress.  elderly, overweight  HENT:  Head: Normocephalic and atraumatic.  Right Ear: External ear normal.  Left Ear: External ear normal.  Eyes: Conjunctivae  are normal. Right eye exhibits no discharge. Left eye exhibits no discharge. No scleral icterus.  Neck: Neck supple. No tracheal deviation present.  Cardiovascular: Normal rate, regular rhythm and intact distal pulses.   Weak pulses in the feet  Pulmonary/Chest: Effort normal and breath sounds normal. No stridor. No respiratory distress. He has no wheezes. He has no rales.  Abdominal: Soft. Bowel sounds are normal. He exhibits no distension. There is no tenderness. There is no rebound and no guarding.  Musculoskeletal: He exhibits  edema. He exhibits no tenderness.  Left great toe is black malodorous drainage, erythema of the toes and foot, increased warmth, no lymphangitic streaking in the lower leg, t no crepitus  Neurological: He is alert. He has normal strength. No cranial nerve deficit (no facial droop, extraocular movements intact, no slurred speech) or sensory deficit. He exhibits normal muscle tone. He displays no seizure activity. Coordination normal.  Skin: Skin is warm and dry. No rash noted. There is erythema.  Psychiatric: He has a normal mood and affect.  Nursing note and vitals reviewed.        ED Treatments / Results  Labs (all labs ordered are listed, but only abnormal results are displayed) Labs Reviewed  CBC WITH DIFFERENTIAL/PLATELET - Abnormal; Notable for the following:       Result Value   WBC 14.3 (*)    Neutro Abs 11.5 (*)    All other components within normal limits  COMPREHENSIVE METABOLIC PANEL - Abnormal; Notable for the following:    Sodium 134 (*)    Chloride 96 (*)    Glucose, Bld 230 (*)    BUN 34 (*)    Creatinine, Ser 2.60 (*)    Calcium 8.7 (*)    Albumin 2.6 (*)    ALT 15 (*)    Total Bilirubin 1.7 (*)    GFR calc non Af Amer 23 (*)    GFR calc Af Amer 26 (*)    All other components within normal limits  I-STAT CG4 LACTIC ACID, ED - Abnormal; Notable for the following:    Lactic Acid, Venous 3.47 (*)    All other components within normal  limits    EKG  EKG Interpretation None       Radiology Dg Foot Complete Left  Result Date: 10/28/2015 CLINICAL DATA:  Left big toe pain. EXAM: LEFT FOOT - COMPLETE 3+ VIEW COMPARISON:  None. FINDINGS: There is no evidence of fracture or dislocation. No evidence of dystrophic bony changes. There is soft tissue swelling of the distal first digit. Punctate lucencies within the area of soft tissue swelling may represent entrapped fat or potentially foci of gas. Heavy vascular calcifications are seen. IMPRESSION: No evidence of fracture of the left foot. Soft tissue swelling of the distal first digit with punctate lucencies within the area of swelling, which may represent entrapped fat or potentially foci of gas. Please correlate clinically regarding the possibility of cellulitis. Electronically Signed   By: Fidela Salisbury M.D.   On: 10/28/2015 21:01   Procedures Procedures (including critical care time)  Medications Ordered in ED Medications  sodium chloride 0.9 % bolus 1,000 mL (not administered)  Zosyn per pharmacy ordered   Initial Impression / Assessment and Plan / ED Course  I have reviewed the triage vital signs and the nursing notes.  Pertinent labs & imaging results that were available during my care of the patient were reviewed by me and considered in my medical decision making (see chart for details).  Clinical Course  Value Comment By Time  Lactic Acid, Venous: (!!) 3.47 (Reviewed) Dorie Rank, MD 07/24 2117  Lactic Acid, Venous: (!!) 3.47 Elevated Dorie Rank, MD 07/24 2117  WBC: (!) 14.3 Elevated Dorie Rank, MD 07/24 2118   Patient's renal insufficiency is stable Dorie Rank, MD 07/24 2118    The patient's big toe shows signs of necrosis. He is an elevated lactic acid level and chronic renal insufficiency.  I'll start the patient on IV antibiotics. Consult with orthopedic  surgery and plan on admission to the medical service.  Final Clinical Impressions(s) / ED Diagnoses    Final diagnoses:  Gangrene of toe (HCC)       Dorie Rank, MD 10/28/15 2121

## 2015-10-29 ENCOUNTER — Inpatient Hospital Stay (HOSPITAL_COMMUNITY): Payer: Medicare Other

## 2015-10-29 ENCOUNTER — Encounter (HOSPITAL_COMMUNITY): Payer: Medicare Other

## 2015-10-29 DIAGNOSIS — I96 Gangrene, not elsewhere classified: Secondary | ICD-10-CM

## 2015-10-29 DIAGNOSIS — I70262 Atherosclerosis of native arteries of extremities with gangrene, left leg: Secondary | ICD-10-CM

## 2015-10-29 LAB — URIC ACID: Uric Acid, Serum: 8.1 mg/dL — ABNORMAL HIGH (ref 4.4–7.6)

## 2015-10-29 LAB — GLUCOSE, CAPILLARY
GLUCOSE-CAPILLARY: 200 mg/dL — AB (ref 65–99)
GLUCOSE-CAPILLARY: 232 mg/dL — AB (ref 65–99)
GLUCOSE-CAPILLARY: 233 mg/dL — AB (ref 65–99)
Glucose-Capillary: 179 mg/dL — ABNORMAL HIGH (ref 65–99)
Glucose-Capillary: 188 mg/dL — ABNORMAL HIGH (ref 65–99)

## 2015-10-29 LAB — CBC
HCT: 40.4 % (ref 39.0–52.0)
HEMOGLOBIN: 13.3 g/dL (ref 13.0–17.0)
MCH: 30.6 pg (ref 26.0–34.0)
MCHC: 32.9 g/dL (ref 30.0–36.0)
MCV: 92.9 fL (ref 78.0–100.0)
PLATELETS: 268 10*3/uL (ref 150–400)
RBC: 4.35 MIL/uL (ref 4.22–5.81)
RDW: 14.1 % (ref 11.5–15.5)
WBC: 9.5 10*3/uL (ref 4.0–10.5)

## 2015-10-29 LAB — BASIC METABOLIC PANEL
Anion gap: 8 (ref 5–15)
BUN: 34 mg/dL — ABNORMAL HIGH (ref 6–20)
CALCIUM: 7.9 mg/dL — AB (ref 8.9–10.3)
CO2: 26 mmol/L (ref 22–32)
CREATININE: 2.3 mg/dL — AB (ref 0.61–1.24)
Chloride: 101 mmol/L (ref 101–111)
GFR calc non Af Amer: 26 mL/min — ABNORMAL LOW (ref >60)
GFR, EST AFRICAN AMERICAN: 30 mL/min — AB (ref >60)
Glucose, Bld: 188 mg/dL — ABNORMAL HIGH (ref 65–99)
Potassium: 3.7 mmol/L (ref 3.5–5.1)
SODIUM: 135 mmol/L (ref 135–145)

## 2015-10-29 LAB — MAGNESIUM: MAGNESIUM: 1.9 mg/dL (ref 1.7–2.4)

## 2015-10-29 LAB — PROTIME-INR
INR: 2.75 — AB (ref 0.00–1.49)
PROTHROMBIN TIME: 28.7 s — AB (ref 11.6–15.2)

## 2015-10-29 LAB — IRON AND TIBC
Iron: 28 ug/dL — ABNORMAL LOW (ref 45–182)
Saturation Ratios: 15 % — ABNORMAL LOW (ref 17.9–39.5)
TIBC: 189 ug/dL — AB (ref 250–450)
UIBC: 161 ug/dL

## 2015-10-29 LAB — APTT: APTT: 42 s — AB (ref 24–37)

## 2015-10-29 MED ORDER — HEPARIN SODIUM (PORCINE) 5000 UNIT/ML IJ SOLN: 5000.0000 [IU] | Freq: Three times a day (TID) | INTRAMUSCULAR | Status: DC

## 2015-10-29 MED ORDER — FAMOTIDINE 20 MG PO TABS
20.0000 mg | ORAL_TABLET | Freq: Every day | ORAL | Status: DC
  Administered 2015-10-29 – 2015-11-25 (×27): 20 mg via ORAL
  Filled 2015-10-29 (×28): qty 1

## 2015-10-29 MED ORDER — PRO-STAT SUGAR FREE PO LIQD
30.0000 mL | Freq: Two times a day (BID) | ORAL | Status: DC
  Administered 2015-10-30 – 2015-11-11 (×11): 30 mL via ORAL
  Filled 2015-10-29 (×17): qty 30

## 2015-10-29 MED ORDER — SODIUM CHLORIDE 0.9 % IV SOLN
INTRAVENOUS | Status: DC
  Administered 2015-10-29 – 2015-10-30 (×2): via INTRAVENOUS

## 2015-10-29 MED ORDER — GLUCERNA SHAKE PO LIQD
237.0000 mL | Freq: Every day | ORAL | Status: DC
  Administered 2015-10-29 – 2015-11-03 (×6): 237 mL via ORAL
  Filled 2015-10-29 (×2): qty 237

## 2015-10-29 NOTE — Progress Notes (Addendum)
Initial Nutrition Assessment  DOCUMENTATION CODES:   Obesity unspecified  INTERVENTION:  Provide 30 ml Prostat po BID, each supplement provides 100 kcal and 15 grams of protein.   Provide Glucerna Shake po once daily, each supplement provides 220 kcal and 10 grams of protein.  Encourage adequate PO intake.  NUTRITION DIAGNOSIS:   Increased nutrient needs related to wound healing as evidenced by estimated needs.  GOAL:   Patient will meet greater than or equal to 90% of their needs  MONITOR:   PO intake, Supplement acceptance, Weight trends, Labs, Skin, I & O's  REASON FOR ASSESSMENT:   Consult Wound healing  ASSESSMENT:   75 y.o. 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  Patient presents to ED with c/o 'his toe is dead'.    Meal completion has been 75-80%. Pt reports appetite has been fine currently and PTA with usual consumption of at least 3 meals a day. Weight has been stable per weight records. Pt is agreeable to nutritional supplements to aid in wound healing. RD to order.   Pt with no observed significant fat or muscle mass loss.   Labs and medications reviewed.   Diet Order:  Diet Carb Modified Fluid consistency: Thin; Room service appropriate? Yes  Skin:  Wound (see comment) (Diabetic ulcer L great toe)  Last BM:  7/25  Height:   Ht Readings from Last 1 Encounters:  10/28/15 5\' 9"  (1.753 m)    Weight:   Wt Readings from Last 1 Encounters:  10/28/15 228 lb (103.4 kg)    Ideal Body Weight:  72.7 kg  BMI:  Body mass index is 33.67 kg/m.  Estimated Nutritional Needs:   Kcal:  2000-2300  Protein:  105-125 grams  Fluid:  2-2.3 L/day  EDUCATION NEEDS:   Education needs addressed  Corrin Parker, MS, RD, LDN Pager # [phone] After hours/ weekend pager # [phone]

## 2015-10-29 NOTE — Care Management Important Message (Signed)
Important Message  Patient Details  Name: Mark [name] MRN: [id number] Date of Birth: 25-Dec-1940   Medicare Important Message Given:  Yes    Loann Quill 10/29/2015, 9:50 AM

## 2015-10-29 NOTE — Care Management Note (Signed)
Case Management Note  Patient Details  Name: Mark [name] MRN: [id number] Date of Birth: 1941-03-28  Subjective/Objective:     CM following for progression and d/c planning.                Action/Plan: 10/29/2015 Noted consult for [facility] and DME as needed. Await medical interventions and pt progression. Noted pt lives along and per report has falls prior to adm, await PT eval post any procedures. Will assist with any needs.   Expected Discharge Date:                  Expected Discharge Plan:  [place]  In-House Referral:     Discharge planning Services     Post Acute Care Choice:    Choice offered to:     DME Arranged:    DME Agency:     HH Arranged:    [place] Agency:     Status of Service:  In process, will continue to follow  If discussed at Long Length of Stay Meetings, dates discussed:    Additional Comments:  Adron Bene, RN 10/29/2015, 11:05 AM

## 2015-10-29 NOTE — Consult Note (Signed)
[facility]  Patient name: Mark [name] MRN: [id number] DOB: [birth year] Sex: male  REASON FOR CONSULT: gangrene left great toe  HPI: Mark [name] is a 75 y.o. male, who presents with a gangrenous left great toe after bumping it six weeks ago. He states that his toe has progressively worsened. He says the redness in his foot has been present since hitting his toe. He denies any fever or chills. The toe is tender to touch only. The patient has diabetic neuropathy with diminished sensation to his feet. He is minimally ambulatory and only walks around his house with a cane or walker. He states it has been hard for him to get motivated and walk since his wife passed.   He does not ambulate enough to elicit claudication. He denies a prior history of non healing wounds. He is s/p renal transplant. He is on coumadin for atrial fibrillation. He was diabetes managed on an insulin pump. His hypertension is managed on a beta blocker. He has never been a smoker.   Past Medical History:  Diagnosis Date  . Atrial fibrillation ([place])   . Diabetes ([place])   . Gout   . Hypertension   . Hypothyroidism   . Renal disorder    kidney transplant   . Ulcer of other part of foot 12/15/2013    Family History  Problem Relation Age of Onset  . Bradycardia Mother   . Stroke Father     SOCIAL HISTORY: Social History   Social History  . Marital status: Widowed    Spouse name: N/A  . Number of children: N/A  . Years of education: N/A   Occupational History  . Not on file.   Social History Main Topics  . Smoking status: Never Smoker  . Smokeless tobacco: Never Used  . Alcohol use No  . Drug use: No  . Sexual activity: Not on file   Other Topics Concern  . Not on file   Social History Narrative  . No narrative on file    Allergies  Allergen Reactions  . Tape Other (See Comments)    SKIN WILL TEAR!!    Current Facility-Administered Medications    Medication Dose Route Frequency Provider Last Rate Last Dose  . 0.9 %  sodium chloride infusion   Intravenous Continuous Reyne Dumas, MD      . acetaminophen (TYLENOL) tablet 500 mg  500 mg Oral Q6H PRN Etta Quill, DO      . allopurinol (ZYLOPRIM) tablet 150 mg  150 mg Oral Daily Etta Quill, DO      . calcitRIOL (ROCALTROL) capsule 0.5 mcg  0.5 mcg Oral Daily Etta Quill, DO      . digoxin (LANOXIN) tablet 0.125 mg  0.125 mg Oral Daily Jared M Gardner, DO      . famotidine (PEPCID) tablet 20 mg  20 mg Oral QHS Mauricia Area, MD      . insulin aspart (novoLOG) injection 0-15 Units  0-15 Units Subcutaneous TID WC Etta Quill, DO      . insulin aspart (novoLOG) injection 4 Units  4 Units Subcutaneous TID WC Etta Quill, DO      . insulin glargine (LANTUS) injection 30 Units  30 Units Subcutaneous QHS Etta Quill, DO   30 Units at 10/29/15 0039  . levothyroxine (SYNTHROID, LEVOTHROID) tablet 137 mcg  137 mcg Oral QAC breakfast Etta Quill, DO      .  magnesium oxide (MAG-OX) tablet 400 mg  400 mg Oral BID Etta Quill, DO   400 mg at 10/29/15 0039  . metoprolol succinate (TOPROL-XL) 24 hr tablet 50 mg  50 mg Oral Daily Etta Quill, DO      . phosphorus (K PHOS NEUTRAL) tablet 250 mg  250 mg Oral BID Etta Quill, DO   250 mg at 10/29/15 0040  . piperacillin-tazobactam (ZOSYN) IVPB 3.375 g  3.375 g Intravenous Q8H Wynell Balloon, RPH 12.5 mL/hr at 10/29/15 0644 3.375 g at 10/29/15 0644  . potassium chloride SA (K-DUR,KLOR-CON) CR tablet 50 mEq  50 mEq Oral BID Etta Quill, DO      . predniSONE (DELTASONE) tablet 5 mg  5 mg Oral Daily Etta Quill, DO      . rosuvastatin (CRESTOR) tablet 10 mg  10 mg Oral QPM Etta Quill, DO      . tacrolimus (PROGRAF) capsule 0.5 mg  0.5 mg Oral BID Jared M Gardner, DO      . vancomycin (VANCOCIN) IVPB 1000 mg/200 mL premix  1,000 mg Intravenous Q24H Wynell Balloon, RPH        REVIEW OF SYSTEMS:  [X]   denotes positive finding, [ ]  denotes negative finding Cardiac  Comments:  Chest pain or chest pressure:    Shortness of breath upon exertion:    Short of breath when lying flat:    Irregular heart rhythm:        Vascular    Pain in calf, thigh, or hip brought on by ambulation:    Pain in feet at night that wakes you up from your sleep:     Blood clot in your veins:    Leg swelling:  x       Pulmonary    Oxygen at home:    Productive cough:     Wheezing:         Neurologic    Sudden weakness in arms or legs:     Sudden numbness in arms or legs:     Sudden onset of difficulty speaking or slurred speech:    Temporary loss of vision in one eye:     Problems with dizziness:         Gastrointestinal    Blood in stool:     Vomited blood:         Genitourinary    Burning when urinating:     Blood in urine:        Psychiatric    Major depression:         Hematologic    Bleeding problems:    Problems with blood clotting too easily:        Skin    Rashes or ulcers:        Constitutional    Fever or chills:      PHYSICAL EXAM: Vitals:   10/28/15 2115 10/29/15 0002 10/29/15 0446 10/29/15 0743  BP: 170/70 (!) 173/85 138/75 129/66  Pulse: 68 74 (!) 58 62  Resp:  18 18 18   Temp:   98.1 F (36.7 C) 97.9 F (36.6 C)  TempSrc:   Oral Oral  SpO2: 100% 100% 98% 98%  Weight:      Height:        GENERAL: The patient is a well-nourished, morbidly obese male, in no acute distress. The vital signs are documented above. CARDIAC: Irregularly irregular, no carotid bruits.  VASCULAR: 2+ radial and femoral pulses. Non palpable  popliteal pulses. Monophasic left DP and PT. Biphasic right DP and PT. Dry gangrene left great toe. Malodorous. No drainage. Erythema of left foot stopping just before ankle.  PULMONARY: There is good air exchange bilaterally without wheezing or rales. ABDOMEN: Soft and non-tender with normal pitched bowel sounds.  MUSCULOSKELETAL: 2+ pitting edema  bilaterally. Left leg is more swollen than right.  NEUROLOGIC: Diminished sensation to feet bilaterally.  SKIN: See vascular section.  PSYCHIATRIC: The patient has a normal affect.  DATA:    RIGHT   LEFT    PRESSURE WAVEFORM  PRESSURE WAVEFORM  BRACHIAL 188 Normal BRACHIAL 166 Normal  DP 254/[place] biphasic DP 254/[place] Biphasic  PT 202/[place] Biphasic PT Not obtained Bi/monophas  PER   PER    GREAT TOE 56 NA GREAT TOE  NA    RIGHT LEFT  ABI 1.0 NA    MEDICAL ISSUES:  Dry gangrene left great toe Left foot cellulitis  The patient will need an arteriogram to evaluate blood flow to his left foot. Given his hx of renal transplant and elevated creatinine of 2.30, will likely need to do study with C02. Continue antibiotics for left great toe infection and cellulitis. Dr. Trula Slade to see patient.   Virgina Jock, PA-C [facility] [place]     I agree with the above.  The patient has a limb theatening wound on his left great toe.  Vascular lab studies suggest significant arterial insufficiency.  Situation complicated by renal transplant with elevated creatinine.  In addition, he is on coumadin for AFIB.  I would recommend stopping his coumadin and letting his INR trend down. Continue with IV hydration with plans for angiogram later this week.  He will most likely require a second angiogram if an intervention can be performed.  I would space out the 2 procedures to minimize contrast utilization.  At some point after revascularization, he will need a toe amputation   Annamarie Major

## 2015-10-29 NOTE — Progress Notes (Signed)
Patient came in with a disposable insulin pump. Notified MD Gardener. Insulin pump to be discontinued,30 units of Lantus to be administered per MD's order. Insulin pump taken off by patient and disposed off, 30 units of Lantus given.

## 2015-10-29 NOTE — Consult Note (Signed)
[facility] nurse consulted for great toe, however after review of chart patient will need surgical intervention.  Pending orthopedic consultation.  .  Re consult if needed, will not follow at this time. Thanks  Donnivan Villena [employer], RN,CNS, [place] [phone])

## 2015-10-29 NOTE — Progress Notes (Addendum)
[facility] PROGRESS NOTE  Mark [name] [id number] DOB: Apr 25, 1940 DOA: 10/28/2015   PCP: Rochel Brome, MD     Assessment/Plan: Principal Problem:   Gangrene of toe ([place]) Active Problems:   Type II diabetes mellitus, uncontrolled ([place])   Renal transplant recipient   Diabetic ulcer of left great toe ([place])   Cellulitis of great toe of left foot   Mark [name] is a 75 y.o. 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  Patient presents to ED with c/o 'his toe is dead'.  This onset about 1 month ago.  Finally saw doctor today after he fell getting out of the shower this morning.  His doctor was concerned about infection and sent him to the ED.  No fever no vomiting.  ED Course:  Toe is grossly infected and necrotic  . 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                       Diabetic ulcer pathway                        requested podiatry eval, ABI is pending,, likely needs amputation of toe (see pictures above). Requested Dr. Sharol Given to consult, he recommends vascular surgery consult, requested Dr. Trula Slade to see the patient as well     Chronic kidney disease stage IV, status post renal transplant                       BMP repeat in AM, creatinine has been trending up over the past year                       Continue anti-rejection meds, will need nephrology consult, call [name]    DM2 -                       Lantus 30,                       Moderate dose mealtime and SSI AC/HS Check hemoglobin A1c, diabetes coordinator consultation    Hypothyroidism-continue Synthroid  Hypertension-continue metoprolol  Dyslipidemia-continue  Crestor    DVT prophylaxis: holding Coumadin, convert to heparin gtt when subtheraputic  Code Status:  Full code     Family Communication: Discussed in detail with the patient, all imaging results, lab results explained to the patient   Disposition Plan: Nephrology, orthopedic/podiatry consult     Consultants: Nephrology Podiatry Vascular  Procedures: None  Antibiotics: Anti-infectives    Start     Dose/Rate Route Frequency Ordered Stop   10/29/15 2200  vancomycin (VANCOCIN) IVPB 1000 mg/200 mL premix     1,000 mg 200 mL/hr over 60 Minutes Intravenous Every 24 hours 10/28/15 2154     10/28/15 2200  piperacillin-tazobactam (ZOSYN) IVPB 3.375 g     3.375 g 12.5 mL/hr over 240 Minutes Intravenous Every 8 hours 10/28/15 2120  HPI/Subjective: Patient extremely talkative. Thinks the healthcare system is trying to rip him off   Objective: Vitals:   10/28/15 2115 10/29/15 0002 10/29/15 0446 10/29/15 0743  BP: 170/70 (!) 173/85 138/75 129/66  Pulse: 68 74 (!) 58 62  Resp:  18 18 18   Temp:   98.1 F (36.7 C) 97.9 F (36.6 C)  TempSrc:   Oral Oral  SpO2: 100% 100% 98% 98%  Weight:      Height:        Intake/Output Summary (Last 24 hours) at 10/29/15 0906 Last data filed at 10/29/15 [id number]  Gross per 24 hour  Intake              590 ml  Output              325 ml  Net              265 ml    Exam:  Examination:  General exam: Appears calm and comfortable  Respiratory system: Clear to auscultation. Respiratory effort normal. Cardiovascular system: S1 & S2 heard, RRR. No JVD, murmurs, rubs, gallops or clicks. No pedal edema. Gastrointestinal system: Abdomen is nondistended, soft and nontender. No organomegaly or masses felt. Normal bowel sounds heard. Central nervous system: Alert and oriented. No focal neurological deficits. Extremities: Symmetric 5 x 5 power. Skin: No rashes, lesions or ulcers Psychiatry: Judgement and insight appear normal. Mood &  affect appropriate.     Data Reviewed: I have personally reviewed following labs and imaging studies  Micro Results No results found for this or any previous visit (from the past 240 hour(s)).  Radiology Reports Dg Foot Complete Left  Result Date: 10/28/2015 CLINICAL DATA:  Left big toe pain. EXAM: LEFT FOOT - COMPLETE 3+ VIEW COMPARISON:  None. FINDINGS: There is no evidence of fracture or dislocation. No evidence of dystrophic bony changes. There is soft tissue swelling of the distal first digit. Punctate lucencies within the area of soft tissue swelling may represent entrapped fat or potentially foci of gas. Heavy vascular calcifications are seen. IMPRESSION: No evidence of fracture of the left foot. Soft tissue swelling of the distal first digit with punctate lucencies within the area of swelling, which may represent entrapped fat or potentially foci of gas. Please correlate clinically regarding the possibility of cellulitis. Electronically Signed   By: Fidela Salisbury M.D.   On: 10/28/2015 21:01    CBC  Recent Labs Lab 10/28/15 1919 10/29/15 0631  WBC 14.3* 9.5  HGB 15.0 13.3  HCT 44.9 40.4  PLT 327 268  MCV 93.2 92.9  MCH 31.1 30.6  MCHC 33.4 32.9  RDW 14.3 14.1  LYMPHSABS 1.9  --   MONOABS 0.9  --   EOSABS 0.0  --   BASOSABS 0.0  --     Chemistries   Recent Labs Lab 10/28/15 1919 10/29/15 0631  NA 134* 135  K 4.1 3.7  CL 96* 101  CO2 27 26  GLUCOSE 230* 188*  BUN 34* 34*  CREATININE 2.60* 2.30*  CALCIUM 8.7* 7.9*  AST 32  --   ALT 15*  --   ALKPHOS 60  --   BILITOT 1.7*  --    ------------------------------------------------------------------------------------------------------------------ estimated creatinine clearance is 32.9 mL/min (by C-G formula based on SCr of 2.3 mg/dL). ------------------------------------------------------------------------------------------------------------------ No results for input(s): HGBA1C in the last 72  hours. ------------------------------------------------------------------------------------------------------------------ No results for input(s): CHOL, HDL, LDLCALC, TRIG, CHOLHDL, LDLDIRECT in the last 72 hours. ------------------------------------------------------------------------------------------------------------------ No results for input(s): TSH,  T4TOTAL, T3FREE, THYROIDAB in the last 72 hours.  Invalid input(s): FREET3 ------------------------------------------------------------------------------------------------------------------ No results for input(s): VITAMINB12, FOLATE, FERRITIN, TIBC, IRON, RETICCTPCT in the last 72 hours.  Coagulation profile  Recent Labs Lab 10/28/15 2305  INR 2.68*    No results for input(s): DDIMER in the last 72 hours.  Cardiac Enzymes No results for input(s): CKMB, TROPONINI, MYOGLOBIN in the last 168 hours.  Invalid input(s): CK ------------------------------------------------------------------------------------------------------------------ Invalid input(s): POCBNP   CBG:  Recent Labs Lab 10/28/15 1317 10/28/15 2211 10/28/15 2359 10/29/15 0742  GLUCAP 262* 182* 232* 179*       Studies: Dg Foot Complete Left  Result Date: 10/28/2015 CLINICAL DATA:  Left big toe pain. EXAM: LEFT FOOT - COMPLETE 3+ VIEW COMPARISON:  None. FINDINGS: There is no evidence of fracture or dislocation. No evidence of dystrophic bony changes. There is soft tissue swelling of the distal first digit. Punctate lucencies within the area of soft tissue swelling may represent entrapped fat or potentially foci of gas. Heavy vascular calcifications are seen. IMPRESSION: No evidence of fracture of the left foot. Soft tissue swelling of the distal first digit with punctate lucencies within the area of swelling, which may represent entrapped fat or potentially foci of gas. Please correlate clinically regarding the possibility of cellulitis. Electronically Signed    By: Fidela Salisbury M.D.   On: 10/28/2015 21:01     Lab Results  Component Value Date   HGBA1C 8.8 06/06/2015   HGBA1C 9.4 11/26/2014   HGBA1C 9.3 (A) 06/12/2014   Lab Results  Component Value Date   MICROALBUR 88.1 (H) 06/06/2015   LDLCALC 88 09/27/2013   CREATININE 2.30 (H) 10/29/2015       Scheduled Meds: . allopurinol  150 mg Oral Daily  . calcitRIOL  0.5 mcg Oral Daily  . digoxin  0.125 mg Oral Daily  . furosemide  200 mg Oral BID  . insulin aspart  0-15 Units Subcutaneous TID WC  . insulin aspart  4 Units Subcutaneous TID WC  . insulin glargine  30 Units Subcutaneous QHS  . levothyroxine  137 mcg Oral QAC breakfast  . magnesium oxide  400 mg Oral BID  . metoprolol succinate  50 mg Oral Daily  . pantoprazole  40 mg Oral Daily  . phosphorus  250 mg Oral BID  . piperacillin-tazobactam (ZOSYN)  IV  3.375 g Intravenous Q8H  . potassium chloride SA  50 mEq Oral BID  . predniSONE  5 mg Oral Daily  . rosuvastatin  10 mg Oral QPM  . tacrolimus  0.5 mg Oral BID  . vancomycin  1,000 mg Intravenous Q24H   Continuous Infusions:    LOS: 1 day    Time spent: >30 MINS    [facility]  [facility] Pager [phone]. If 7PM-7AM, please contact night-coverage at [email or web address], password [facility] 10/29/2015, 9:06 AM  LOS: 1 day

## 2015-10-29 NOTE — Consult Note (Signed)
Reason for Consult:Renal Transplant, AKI Referring Physician: Dr. Sonia Baller DYLANN GALLIER is an 75 y.o. male.  HPI: 75 yr male with ESRD secondary to DM, type 2, s/p Renal TX 2005 at [facility].  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  Now with necrotic L great toe with reddness going up foot (cellulitis).  Cr on admit 2.6, now 2.3.  Has had poor intake.   No N,V, D, or poor appetite. BS doing well, noct x 2.  Has repeatedly bumped the toe.  Had infection R leg last year. Past procedures to vessels of both legs in [place]. Constitutional: initially denies issues, but then poor fluid intake Eyes: has had DM retinal dz, catarracts done Ears, nose, mouth, throat, and face: negative Respiratory: negative Cardiovascular: notes some LE edema Gastrointestinal: negative Genitourinary:noct x 3 Integument/breast: as above Hematologic/lymphatic: negative Musculoskeletal:L ankle issues Neurological: poor sensation in feet Endocrine: tape Allergic/Immunologic: negative    Primary Nephrologist Justin Mend. .  Past Medical History:  Diagnosis Date  . Atrial fibrillation ([place])   . Diabetes ([place])   . Gout   . Hypertension   . Hypothyroidism   . Renal disorder    kidney transplant   . Ulcer of other part of foot 12/15/2013    Past Surgical History:  Procedure Laterality Date  . ANKLE FRACTURE SURGERY    . CATARACT EXTRACTION    . MELANOMA EXCISION    . NEPHRECTOMY TRANSPLANTED ORGAN      Family History  Problem Relation Age of Onset  . Bradycardia Mother   . Stroke Father     Social History:  reports that he has never smoked. He has never used smokeless tobacco. He reports that he does not drink alcohol or use drugs.  Allergies:  Allergies  Allergen Reactions  . Tape Other (See Comments)    SKIN WILL TEAR!!    Medications:  I have reviewed the patient's current medications. Prior to Admission:  Prescriptions Prior to Admission  Medication Sig Dispense Refill Last Dose   . acetaminophen (TYLENOL) 500 MG tablet Take 500 mg by mouth every 6 (six) hours as needed for mild pain or moderate pain.   Past Month at Unknown time  . allopurinol (ZYLOPRIM) 300 MG tablet TAKE 1/2 OF A TABLET BY MOUTH ONCE DAILY. (Patient taking differently: Take 150 mg by mouth in the morning) 15 tablet 5 10/27/2015 at 0900  . BD PEN NEEDLE NANO U/F 32G X 4 MM MISC USE AS DIRECTED 5 TIMES DAILY SUBQUE. 200 each 1 Taking  . calcitRIOL (ROCALTROL) 0.5 MCG capsule Take one by mouth daily   10/28/2015 at 1500  . DIGOX 0.125 MG tablet Take one by mouth daily   10/27/2015 at pm  . fenofibrate micronized (LOFIBRA) 134 MG capsule TAKE ONE CAPSULE EVERY DAY WITH A MEAL (Patient taking differently: Take 1 capsule by mouth in the morning on Mon/Wed/Fri) 90 capsule 1 10/28/2015 at am  . furosemide (LASIX) 80 MG tablet Take 200 mg by mouth 2 (two) times daily.    10/28/2015 at 1500  . Insulin Disposable Pump (V-GO 40) KIT Use one per day (Patient taking differently: As directed) 30 kit 3   . insulin regular (NOVOLIN R) 100 units/mL injection Inject 78 Units into the skin See admin instructions. (Daily) Via pump V-Go 40   continuous at continuous  . Insulin Syringe-Needle U-100 (INSULIN SYRINGE .3CC/31GX5/16") 31G X 5/16" 0.3 ML MISC Use as directed to inject insulin 100 each 3 Taking  .  ketoconazole (NIZORAL) 2 % shampoo Apply 1 application topically 2 (two) times a week.    Past Month at Unknown time  . levothyroxine (SYNTHROID, LEVOTHROID) 137 MCG tablet Take 137 mcg by mouth daily before breakfast.   10/28/2015 at 1500  . magnesium oxide (MAG-OX) 400 MG tablet Take 400 mg by mouth 2 (two) times daily.   10/28/2015 at 1500  . metoprolol succinate (TOPROL-XL) 50 MG 24 hr tablet Take 50 mg by mouth daily.    10/28/2015 at 1500  . mupirocin ointment (BACTROBAN) 2 % Apply 1 application topically daily as needed.   0 Taking  . nystatin (MYCOSTATIN/NYSTOP) 100000 UNIT/GM POWD Apply topically daily as needed.    Taking   . omeprazole (PRILOSEC) 20 MG capsule Take 20 mg by mouth daily.   10/28/2015 at 1500  . ONETOUCH DELICA LANCETS FINE MISC Use to check blood sugar 4 times per day dx code 250.02 200 each 3 Taking  . ONETOUCH VERIO test strip USE AS INSTRUCTED TO CHECK BLOOD SUGAR 4 TIMES A DAY 150 each 3 Taking  . PHOSPHA 250 NEUTRAL 155-852-130 MG tablet Take 250 mg by mouth 2 (two) times daily.   12 10/28/2015 at 1500  . potassium chloride SA (K-DUR,KLOR-CON) 20 MEQ tablet Take 50 mEq by mouth 2 (two) times daily.    10/28/2015 at 1500  . predniSONE (DELTASONE) 5 MG tablet Take 5 mg by mouth daily.  0 10/28/2015 at 1500  . rosuvastatin (CRESTOR) 10 MG tablet Take 10 mg by mouth every evening.   10/28/2015 at 1500  . tacrolimus (PROGRAF) 0.5 MG capsule Take 0.5 mg by mouth 2 (two) times daily.   10/28/2015 at 1500  . warfarin (COUMADIN) 5 MG tablet Take 2.5-5 mg by mouth See admin instructions. 5 mg in the evening on Sun/Mon/Wed/Fri/Sat and 2.5 mg on Tues/Thurs   10/27/2015 at 1700    Calcitriol .5 mcg.   Results for orders placed or performed during the hospital encounter of 10/28/15 (from the past 48 hour(s))  CBC with Differential     Status: Abnormal   Collection Time: 10/28/15  7:19 PM  Result Value Ref Range   WBC 14.3 (H) 4.0 - 10.5 K/uL   RBC 4.82 4.22 - 5.81 MIL/uL   Hemoglobin 15.0 13.0 - 17.0 g/dL   HCT 44.9 39.0 - 52.0 %   MCV 93.2 78.0 - 100.0 fL   MCH 31.1 26.0 - 34.0 pg   MCHC 33.4 30.0 - 36.0 g/dL   RDW 14.3 11.5 - 15.5 %   Platelets 327 150 - 400 K/uL   Neutrophils Relative % 81 %   Lymphocytes Relative 13 %   Monocytes Relative 6 %   Eosinophils Relative 0 %   Basophils Relative 0 %   Neutro Abs 11.5 (H) 1.7 - 7.7 K/uL   Lymphs Abs 1.9 0.7 - 4.0 K/uL   Monocytes Absolute 0.9 0.1 - 1.0 K/uL   Eosinophils Absolute 0.0 0.0 - 0.7 K/uL   Basophils Absolute 0.0 0.0 - 0.1 K/uL  Comprehensive metabolic panel     Status: Abnormal   Collection Time: 10/28/15  7:19 PM  Result Value Ref Range    Sodium 134 (L) 135 - 145 mmol/L   Potassium 4.1 3.5 - 5.1 mmol/L   Chloride 96 (L) 101 - 111 mmol/L   CO2 27 22 - 32 mmol/L   Glucose, Bld 230 (H) 65 - 99 mg/dL   BUN 34 (H) 6 - 20 mg/dL   Creatinine, Ser  2.60 (H) 0.61 - 1.24 mg/dL   Calcium 8.7 (L) 8.9 - 10.3 mg/dL   Total Protein 6.7 6.5 - 8.1 g/dL   Albumin 2.6 (L) 3.5 - 5.0 g/dL   AST 32 15 - 41 U/L   ALT 15 (L) 17 - 63 U/L   Alkaline Phosphatase 60 38 - 126 U/L   Total Bilirubin 1.7 (H) 0.3 - 1.2 mg/dL   GFR calc non Af Amer 23 (L) >60 mL/min   GFR calc Af Amer 26 (L) >60 mL/min    Comment: (NOTE) The eGFR has been calculated using the CKD EPI equation. This calculation has not been validated in all clinical situations. eGFR's persistently <60 mL/min signify possible Chronic Kidney Disease.    Anion gap 11 5 - 15  I-Stat CG4 Lactic Acid, ED     Status: Abnormal   Collection Time: 10/28/15  7:40 PM  Result Value Ref Range   Lactic Acid, Venous 3.47 (HH) 0.5 - 1.9 mmol/L   Comment NOTIFIED PHYSICIAN   CBG monitoring, ED     Status: Abnormal   Collection Time: 10/28/15 10:11 PM  Result Value Ref Range   Glucose-Capillary 182 (H) 65 - 99 mg/dL  Prealbumin     Status: Abnormal   Collection Time: 10/28/15 11:05 PM  Result Value Ref Range   Prealbumin 9.7 (L) 18 - 38 mg/dL  Protime-INR     Status: Abnormal   Collection Time: 10/28/15 11:05 PM  Result Value Ref Range   Prothrombin Time 28.1 (H) 11.6 - 15.2 seconds   INR 2.68 (H) 0.00 - 1.49  Glucose, capillary     Status: Abnormal   Collection Time: 10/28/15 11:59 PM  Result Value Ref Range   Glucose-Capillary 232 (H) 65 - 99 mg/dL  CBC     Status: None   Collection Time: 10/29/15  6:31 AM  Result Value Ref Range   WBC 9.5 4.0 - 10.5 K/uL   RBC 4.35 4.22 - 5.81 MIL/uL   Hemoglobin 13.3 13.0 - 17.0 g/dL   HCT 40.4 39.0 - 52.0 %   MCV 92.9 78.0 - 100.0 fL   MCH 30.6 26.0 - 34.0 pg   MCHC 32.9 30.0 - 36.0 g/dL   RDW 14.1 11.5 - 15.5 %   Platelets 268 150 - 400  K/uL  Basic metabolic panel     Status: Abnormal   Collection Time: 10/29/15  6:31 AM  Result Value Ref Range   Sodium 135 135 - 145 mmol/L   Potassium 3.7 3.5 - 5.1 mmol/L   Chloride 101 101 - 111 mmol/L   CO2 26 22 - 32 mmol/L   Glucose, Bld 188 (H) 65 - 99 mg/dL   BUN 34 (H) 6 - 20 mg/dL   Creatinine, Ser 2.30 (H) 0.61 - 1.24 mg/dL   Calcium 7.9 (L) 8.9 - 10.3 mg/dL   GFR calc non Af Amer 26 (L) >60 mL/min   GFR calc Af Amer 30 (L) >60 mL/min    Comment: (NOTE) The eGFR has been calculated using the CKD EPI equation. This calculation has not been validated in all clinical situations. eGFR's persistently <60 mL/min signify possible Chronic Kidney Disease.    Anion gap 8 5 - 15  Glucose, capillary     Status: Abnormal   Collection Time: 10/29/15  7:42 AM  Result Value Ref Range   Glucose-Capillary 179 (H) 65 - 99 mg/dL  Protime-INR     Status: Abnormal   Collection Time: 10/29/15  8:28  AM  Result Value Ref Range   Prothrombin Time 28.7 (H) 11.6 - 15.2 seconds   INR 2.75 (H) 0.00 - 1.49  Glucose, capillary     Status: Abnormal   Collection Time: 10/29/15 11:47 AM  Result Value Ref Range   Glucose-Capillary 188 (H) 65 - 99 mg/dL    Dg Foot Complete Left  Result Date: 10/28/2015 CLINICAL DATA:  Left big toe pain. EXAM: LEFT FOOT - COMPLETE 3+ VIEW COMPARISON:  None. FINDINGS: There is no evidence of fracture or dislocation. No evidence of dystrophic bony changes. There is soft tissue swelling of the distal first digit. Punctate lucencies within the area of soft tissue swelling may represent entrapped fat or potentially foci of gas. Heavy vascular calcifications are seen. IMPRESSION: No evidence of fracture of the left foot. Soft tissue swelling of the distal first digit with punctate lucencies within the area of swelling, which may represent entrapped fat or potentially foci of gas. Please correlate clinically regarding the possibility of cellulitis. Electronically Signed   By:  Fidela Salisbury M.D.   On: 10/28/2015 21:01   ROS Blood pressure 129/66, pulse 62, temperature 97.9 F (36.6 C), temperature source Oral, resp. rate 18, height _0  (1.753 m), weight 103.4 kg (228 lb), SpO2 98 %. Physical Exam Physical Examination: General appearance - alert, well appearing, and in no distress Mental status - alert, oriented to person, place, and time, inappropriate safety awareness Eyes - DM retinopathy Mouth - film on tongue Neck - adenopathy noted PCL Lymphatics - posterior cervical nodes Chest - decreased bs, soft Heart - S1 and S2 normal, systolic murmur EB5/8 at apex, prematures Abdomen - obese, TX RLQ, soft, pos bs Back exam - full range of motion, no tenderness, palpable spasm or pain on motion, large defect in skin, on upper back Extremities - 1+ edema, L ankle charcot, L great toe black, wet and necrotic, reddness to mid and prox foot Skin - as ablove  Assessment/Plan: 1 Renal Transplant with mild dysfunction , some related to vol, and will R/O other.  At risk for dye toxicity so minimize 2 Gangrene with cellulitis on AB, ? vasc vs amp 3 Hypertension: not an issue 4. DM controll 5. Metabolic Bone Disease: check PTH 6 ? Mild dementia 7 Past PVD 8 Hypophos 9 Hypomag P UA, Prograf level, Mg , phos, Urine chem.  CO2/limited dye if needed.  Aiyana Stegmann L 10/29/2015, 12:26 PM

## 2015-10-29 NOTE — Progress Notes (Signed)
ANTICOAGULATION CONSULT NOTE - Initial Consult  Pharmacy Consult for Heparin Indication: atrial fibrillation  Allergies  Allergen Reactions  . Tape Other (See Comments)    SKIN WILL TEAR!!    Patient Measurements: Height: '5\' 9"'  (175.3 cm) Weight: 228 lb (103.4 kg) IBW/kg (Calculated) : 70.7 Heparin Dosing Weight: 90 kg  Vital Signs: Temp: 97.6 F (36.4 C) (07/24 2017) Temp Source: Oral (07/24 2017) BP: 173/85 (07/25 0002) Pulse Rate: 74 (07/25 0002)  Labs:  Recent Labs  10/28/15 1919 10/28/15 2305  HGB 15.0  --   HCT 44.9  --   PLT 327  --   LABPROT  --  28.1*  INR  --  2.68*  CREATININE 2.60*  --     Estimated Creatinine Clearance: 29.1 mL/min (by C-G formula based on SCr of 2.6 mg/dL).   Medical History: Past Medical History:  Diagnosis Date  . Atrial fibrillation ([place])   . Diabetes ([place])   . Gout   . Hypertension   . Hypothyroidism   . Renal disorder    kidney transplant   . Ulcer of other part of foot 12/15/2013    Medications:  Prescriptions Prior to Admission  Medication Sig Dispense Refill Last Dose  . acetaminophen (TYLENOL) 500 MG tablet Take 500 mg by mouth every 6 (six) hours as needed for mild pain or moderate pain.   Past Month at Unknown time  . allopurinol (ZYLOPRIM) 300 MG tablet TAKE 1/2 OF A TABLET BY MOUTH ONCE DAILY. (Patient taking differently: Take 150 mg by mouth in the morning) 15 tablet 5 10/27/2015 at 0900  . BD PEN NEEDLE NANO U/F 32G X 4 MM MISC USE AS DIRECTED 5 TIMES DAILY SUBQUE. 200 each 1 Taking  . calcitRIOL (ROCALTROL) 0.5 MCG capsule Take one by mouth daily   10/28/2015 at 1500  . DIGOX 0.125 MG tablet Take one by mouth daily   10/27/2015 at pm  . fenofibrate micronized (LOFIBRA) 134 MG capsule TAKE ONE CAPSULE EVERY DAY WITH A MEAL (Patient taking differently: Take 1 capsule by mouth in the morning on Mon/Wed/Fri) 90 capsule 1 10/28/2015 at am  . furosemide (LASIX) 80 MG tablet Take 200 mg by mouth 2 (two) times daily.     10/28/2015 at 1500  . Insulin Disposable Pump (V-GO 40) KIT Use one per day (Patient taking differently: As directed) 30 kit 3   . insulin regular (NOVOLIN R) 100 units/mL injection Inject 78 Units into the skin See admin instructions. (Daily) Via pump V-Go 40   continuous at continuous  . Insulin Syringe-Needle U-100 (INSULIN SYRINGE .3CC/31GX5/16") 31G X 5/16" 0.3 ML MISC Use as directed to inject insulin 100 each 3 Taking  . ketoconazole (NIZORAL) 2 % shampoo Apply 1 application topically 2 (two) times a week.    Past Month at Unknown time  . levothyroxine (SYNTHROID, LEVOTHROID) 137 MCG tablet Take 137 mcg by mouth daily before breakfast.   10/28/2015 at 1500  . magnesium oxide (MAG-OX) 400 MG tablet Take 400 mg by mouth 2 (two) times daily.   10/28/2015 at 1500  . metoprolol succinate (TOPROL-XL) 50 MG 24 hr tablet Take 50 mg by mouth daily.    10/28/2015 at 1500  . mupirocin ointment (BACTROBAN) 2 % Apply 1 application topically daily as needed.   0 Taking  . nystatin (MYCOSTATIN/NYSTOP) 100000 UNIT/GM POWD Apply topically daily as needed.    Taking  . omeprazole (PRILOSEC) 20 MG capsule Take 20 mg by mouth daily.   10/28/2015  at 1500  . ONETOUCH DELICA LANCETS FINE MISC Use to check blood sugar 4 times per day dx code 250.02 200 each 3 Taking  . ONETOUCH VERIO test strip USE AS INSTRUCTED TO CHECK BLOOD SUGAR 4 TIMES A DAY 150 each 3 Taking  . PHOSPHA 250 NEUTRAL 155-852-130 MG tablet Take 250 mg by mouth 2 (two) times daily.   12 10/28/2015 at 1500  . potassium chloride SA (K-DUR,KLOR-CON) 20 MEQ tablet Take 50 mEq by mouth 2 (two) times daily.    10/28/2015 at 1500  . predniSONE (DELTASONE) 5 MG tablet Take 5 mg by mouth daily.  0 10/28/2015 at 1500  . rosuvastatin (CRESTOR) 10 MG tablet Take 10 mg by mouth every evening.   10/28/2015 at 1500  . tacrolimus (PROGRAF) 0.5 MG capsule Take 0.5 mg by mouth 2 (two) times daily.   10/28/2015 at 1500  . warfarin (COUMADIN) 5 MG tablet Take 2.5-5 mg by mouth  See admin instructions. 5 mg in the evening on Sun/Mon/Wed/Fri/Sat and 2.5 mg on Tues/Thurs   10/27/2015 at 1700    Assessment: 75 y.o. male with h/o Afib, Coumadin on hold for toe amputation, for heparin once INR < 2.  INR 2.68 tonight  Goal of Therapy:  Heparin level 0.3-0.7 units/ml Monitor platelets by anticoagulation protocol: Yes   Plan:  F/U daily INR and start heparin once  INR < 2  Coltyn Hanning, Bronson Curb 10/29/2015,1:02 AM

## 2015-10-29 NOTE — Progress Notes (Signed)
New Admission Note:  Arrival Method:Via stretcher with nurse Tech Mental Orientation: Alert and oriented x4 Telemetry: n/a Assessment: Completed Skin: Wound on left great toe, see doc flowsheet IV: right forearm Pain: denies pain Tubes: n/a  Safety Measures: Safety Fall Prevention Plan was given, discussed and signed. Admission: Completed [address] [place] Orientation: Patient has been orientated to the room, unit and the staff. Family: Daughter at bedside  Orders have been reviewed and implemented. Will continue to monitor the patient. Call light has been placed within reach and bed alarm has been activated.   Leandro Reasoner BSN, RN  Phone Number: [phone] [facility]

## 2015-10-29 NOTE — Progress Notes (Addendum)
VASCULAR LAB PRELIMINARY  ARTERIAL  ABI completed: Inaccurate ABI results due to non-compressible vessels bilaterally.   The left PTA was not obtainable due to patient inability to tolerate the BP cuff compression. The right TBI is abnormal.     RIGHT    LEFT    PRESSURE WAVEFORM  PRESSURE WAVEFORM  BRACHIAL 188 Normal BRACHIAL 166 Normal  DP 254/[place] biphasic DP 254/[place] Biphasic  PT 202/[place] Biphasic PT Not obtained Bi/monophas  PER   PER    GREAT TOE 56 NA GREAT TOE  NA    RIGHT LEFT  ABI 1.0 NA     Ardella Chhim D, RVT 10/29/2015, 11:03 AM

## 2015-10-29 NOTE — Progress Notes (Signed)
ANTICOAGULATION CONSULT NOTE - Initial Consult  Pharmacy Consult for Heparin Indication: atrial fibrillation  Allergies  Allergen Reactions  . Tape Other (See Comments)    SKIN WILL TEAR!!    Patient Measurements: Height: '5\' 9"'  (175.3 cm) Weight: 228 lb (103.4 kg) IBW/kg (Calculated) : 70.7 Heparin Dosing Weight: 90 kg  Vital Signs: Temp: 97.9 F (36.6 C) (07/25 0743) Temp Source: Oral (07/25 0743) BP: 129/66 (07/25 0743) Pulse Rate: 62 (07/25 0743)  Labs:  Recent Labs  10/28/15 1919 10/28/15 2305 10/29/15 0631 10/29/15 0828  HGB 15.0  --  13.3  --   HCT 44.9  --  40.4  --   PLT 327  --  268  --   LABPROT  --  28.1*  --  28.7*  INR  --  2.68*  --  2.75*  CREATININE 2.60*  --  2.30*  --     Estimated Creatinine Clearance: 32.9 mL/min (by C-G formula based on SCr of 2.3 mg/dL).   Medical History: Past Medical History:  Diagnosis Date  . Atrial fibrillation ([place])   . Diabetes ([place])   . Gout   . Hypertension   . Hypothyroidism   . Renal disorder    kidney transplant   . Ulcer of other part of foot 12/15/2013    Medications:  Prescriptions Prior to Admission  Medication Sig Dispense Refill Last Dose  . acetaminophen (TYLENOL) 500 MG tablet Take 500 mg by mouth every 6 (six) hours as needed for mild pain or moderate pain.   Past Month at Unknown time  . allopurinol (ZYLOPRIM) 300 MG tablet TAKE 1/2 OF A TABLET BY MOUTH ONCE DAILY. (Patient taking differently: Take 150 mg by mouth in the morning) 15 tablet 5 10/27/2015 at 0900  . BD PEN NEEDLE NANO U/F 32G X 4 MM MISC USE AS DIRECTED 5 TIMES DAILY SUBQUE. 200 each 1 Taking  . calcitRIOL (ROCALTROL) 0.5 MCG capsule Take one by mouth daily   10/28/2015 at 1500  . DIGOX 0.125 MG tablet Take one by mouth daily   10/27/2015 at pm  . fenofibrate micronized (LOFIBRA) 134 MG capsule TAKE ONE CAPSULE EVERY DAY WITH A MEAL (Patient taking differently: Take 1 capsule by mouth in the morning on Mon/Wed/Fri) 90 capsule 1  10/28/2015 at am  . furosemide (LASIX) 80 MG tablet Take 200 mg by mouth 2 (two) times daily.    10/28/2015 at 1500  . Insulin Disposable Pump (V-GO 40) KIT Use one per day (Patient taking differently: As directed) 30 kit 3   . insulin regular (NOVOLIN R) 100 units/mL injection Inject 78 Units into the skin See admin instructions. (Daily) Via pump V-Go 40   continuous at continuous  . Insulin Syringe-Needle U-100 (INSULIN SYRINGE .3CC/31GX5/16") 31G X 5/16" 0.3 ML MISC Use as directed to inject insulin 100 each 3 Taking  . ketoconazole (NIZORAL) 2 % shampoo Apply 1 application topically 2 (two) times a week.    Past Month at Unknown time  . levothyroxine (SYNTHROID, LEVOTHROID) 137 MCG tablet Take 137 mcg by mouth daily before breakfast.   10/28/2015 at 1500  . magnesium oxide (MAG-OX) 400 MG tablet Take 400 mg by mouth 2 (two) times daily.   10/28/2015 at 1500  . metoprolol succinate (TOPROL-XL) 50 MG 24 hr tablet Take 50 mg by mouth daily.    10/28/2015 at 1500  . mupirocin ointment (BACTROBAN) 2 % Apply 1 application topically daily as needed.   0 Taking  . nystatin (MYCOSTATIN/NYSTOP)  100000 UNIT/GM POWD Apply topically daily as needed.    Taking  . omeprazole (PRILOSEC) 20 MG capsule Take 20 mg by mouth daily.   10/28/2015 at 1500  . ONETOUCH DELICA LANCETS FINE MISC Use to check blood sugar 4 times per day dx code 250.02 200 each 3 Taking  . ONETOUCH VERIO test strip USE AS INSTRUCTED TO CHECK BLOOD SUGAR 4 TIMES A DAY 150 each 3 Taking  . PHOSPHA 250 NEUTRAL 155-852-130 MG tablet Take 250 mg by mouth 2 (two) times daily.   12 10/28/2015 at 1500  . potassium chloride SA (K-DUR,KLOR-CON) 20 MEQ tablet Take 50 mEq by mouth 2 (two) times daily.    10/28/2015 at 1500  . predniSONE (DELTASONE) 5 MG tablet Take 5 mg by mouth daily.  0 10/28/2015 at 1500  . rosuvastatin (CRESTOR) 10 MG tablet Take 10 mg by mouth every evening.   10/28/2015 at 1500  . tacrolimus (PROGRAF) 0.5 MG capsule Take 0.5 mg by mouth 2  (two) times daily.   10/28/2015 at 1500  . warfarin (COUMADIN) 5 MG tablet Take 2.5-5 mg by mouth See admin instructions. 5 mg in the evening on Sun/Mon/Wed/Fri/Sat and 2.5 mg on Tues/Thurs   10/27/2015 at 1700    Assessment: 75 y.o. male with h/o Afib, Coumadin on hold for toe amputation, start heparin once INR < 2.0.  INR 2.68>>2.75 on 7/25. Hgb, plt wnl  Goal of Therapy:  Heparin level 0.3-0.7 units/ml Monitor platelets by anticoagulation protocol: Yes   Plan:  -Follow daily INR, CBC, S/Sx bleeding  -Start heparin once INR < 2  Arrie Senate, PharmD PGY-1 Pharmacy Resident Pager: [phone] 10/29/2015

## 2015-10-29 NOTE — Progress Notes (Signed)
Inpatient Diabetes Program Recommendations  AACE/ADA: New Consensus Statement on Inpatient Glycemic Control (2015)  Target Ranges:  Prepandial:   less than 140 mg/dL      Peak postprandial:   less than 180 mg/dL (1-2 hours)      Critically ill patients:  140 - 180 mg/dL   Lab Results  Component Value Date   GLUCAP 179 (H) 10/29/2015   HGBA1C 8.8 06/06/2015    Review of Glycemic Control  Diabetes history: DM2 Outpatient Diabetes medications: V-Go insulin pump (78 units R is max) Current orders for Inpatient glycemic control: Lantus 30 units QHS, Novolog moderate tidwc + 4 units tidwc HgbA1C pending.  Inpatient Diabetes Program Recommendations:     Pt in Radiology. Will come back and speak with him regarding his glycemic control at home and insulin pump.  Thank you. Lorenda Peck, RD, LDN, CDE Inpatient Diabetes Coordinator [phone]

## 2015-10-30 ENCOUNTER — Ambulatory Visit: Payer: Medicare Other | Admitting: Podiatry

## 2015-10-30 LAB — CBC
HEMATOCRIT: 41.7 % (ref 39.0–52.0)
Hemoglobin: 13.4 g/dL (ref 13.0–17.0)
MCH: 30.2 pg (ref 26.0–34.0)
MCHC: 32.1 g/dL (ref 30.0–36.0)
MCV: 93.9 fL (ref 78.0–100.0)
Platelets: 282 10*3/uL (ref 150–400)
RBC: 4.44 MIL/uL (ref 4.22–5.81)
RDW: 14.1 % (ref 11.5–15.5)
WBC: 9.6 10*3/uL (ref 4.0–10.5)

## 2015-10-30 LAB — COMPREHENSIVE METABOLIC PANEL
ALT: 13 U/L — AB (ref 17–63)
AST: 22 U/L (ref 15–41)
Albumin: 2.1 g/dL — ABNORMAL LOW (ref 3.5–5.0)
Alkaline Phosphatase: 53 U/L (ref 38–126)
Anion gap: 6 (ref 5–15)
BILIRUBIN TOTAL: 1.2 mg/dL (ref 0.3–1.2)
BUN: 31 mg/dL — AB (ref 6–20)
CO2: 27 mmol/L (ref 22–32)
CREATININE: 2.14 mg/dL — AB (ref 0.61–1.24)
Calcium: 8.4 mg/dL — ABNORMAL LOW (ref 8.9–10.3)
Chloride: 104 mmol/L (ref 101–111)
GFR, EST AFRICAN AMERICAN: 33 mL/min — AB (ref >60)
GFR, EST NON AFRICAN AMERICAN: 29 mL/min — AB (ref >60)
Glucose, Bld: 225 mg/dL — ABNORMAL HIGH (ref 65–99)
POTASSIUM: 4.2 mmol/L (ref 3.5–5.1)
Sodium: 137 mmol/L (ref 135–145)
TOTAL PROTEIN: 6.1 g/dL — AB (ref 6.5–8.1)

## 2015-10-30 LAB — GLUCOSE, CAPILLARY
GLUCOSE-CAPILLARY: 169 mg/dL — AB (ref 65–99)
GLUCOSE-CAPILLARY: 229 mg/dL — AB (ref 65–99)
Glucose-Capillary: 179 mg/dL — ABNORMAL HIGH (ref 65–99)
Glucose-Capillary: 176 mg/dL — ABNORMAL HIGH (ref 65–99)

## 2015-10-30 LAB — RENAL FUNCTION PANEL
Albumin: 2.1 g/dL — ABNORMAL LOW (ref 3.5–5.0)
Anion gap: 9 (ref 5–15)
BUN: 30 mg/dL — ABNORMAL HIGH (ref 6–20)
CALCIUM: 8.5 mg/dL — AB (ref 8.9–10.3)
CO2: 27 mmol/L (ref 22–32)
CREATININE: 1.99 mg/dL — AB (ref 0.61–1.24)
Chloride: 102 mmol/L (ref 101–111)
GFR calc Af Amer: 36 mL/min — ABNORMAL LOW (ref >60)
GFR calc non Af Amer: 31 mL/min — ABNORMAL LOW (ref >60)
GLUCOSE: 218 mg/dL — AB (ref 65–99)
Phosphorus: 2.4 mg/dL — ABNORMAL LOW (ref 2.5–4.6)
Potassium: 4.2 mmol/L (ref 3.5–5.1)
SODIUM: 138 mmol/L (ref 135–145)

## 2015-10-30 LAB — HEMOGLOBIN A1C
HEMOGLOBIN A1C: 8.1 % — AB (ref 4.8–5.6)
HEMOGLOBIN A1C: 8.2 % — AB (ref 4.8–5.6)
MEAN PLASMA GLUCOSE: 186 mg/dL
Mean Plasma Glucose: 189 mg/dL

## 2015-10-30 LAB — URINALYSIS, ROUTINE W REFLEX MICROSCOPIC
BILIRUBIN URINE: NEGATIVE
GLUCOSE, UA: NEGATIVE mg/dL
Ketones, ur: NEGATIVE mg/dL
Leukocytes, UA: NEGATIVE
Nitrite: NEGATIVE
PH: 7 (ref 5.0–8.0)
Protein, ur: 100 mg/dL — AB
SPECIFIC GRAVITY, URINE: 1.016 (ref 1.005–1.030)

## 2015-10-30 LAB — URINE MICROSCOPIC-ADD ON

## 2015-10-30 LAB — SODIUM, URINE, RANDOM: Sodium, Ur: 37 mmol/L

## 2015-10-30 LAB — CREATININE, URINE, RANDOM: Creatinine, Urine: 74.16 mg/dL

## 2015-10-30 LAB — PROTIME-INR
INR: 2.56
Prothrombin Time: 27.2 seconds — ABNORMAL HIGH (ref 11.4–15.2)

## 2015-10-30 LAB — PARATHYROID HORMONE, INTACT (NO CA): PTH: 82 pg/mL — ABNORMAL HIGH (ref 15–65)

## 2015-10-30 LAB — DIGOXIN LEVEL: Digoxin Level: 0.5 ng/mL — ABNORMAL LOW (ref 0.8–2.0)

## 2015-10-30 LAB — HIV ANTIBODY (ROUTINE TESTING W REFLEX): HIV SCREEN 4TH GENERATION: NONREACTIVE

## 2015-10-30 MED ORDER — INSULIN GLARGINE 100 UNIT/ML ~~LOC~~ SOLN
36.0000 [IU] | Freq: Every day | SUBCUTANEOUS | Status: DC
  Administered 2015-10-30 – 2015-11-04 (×6): 36 [IU] via SUBCUTANEOUS
  Filled 2015-10-30 (×8): qty 0.36

## 2015-10-30 MED ORDER — INSULIN ASPART 100 UNIT/ML ~~LOC~~ SOLN
5.0000 [IU] | Freq: Three times a day (TID) | SUBCUTANEOUS | Status: DC
  Administered 2015-10-30 – 2015-11-26 (×32): 5 [IU] via SUBCUTANEOUS

## 2015-10-30 MED ORDER — SODIUM CHLORIDE 0.9 % IV SOLN
510.0000 mg | Freq: Once | INTRAVENOUS | Status: AC
  Administered 2015-10-30: 510 mg via INTRAVENOUS
  Filled 2015-10-30 (×2): qty 17

## 2015-10-30 NOTE — Progress Notes (Signed)
Subjective: Interval History: has no complaint.  Objective: Vital signs in last 24 hours: Temp:  [98.1 F (36.7 C)-98.5 F (36.9 C)] 98.1 F (36.7 C) (07/26 0502) Pulse Rate:  [75-92] 92 (07/26 0502) Resp:  [17] 17 (07/26 0502) BP: (169-178)/(70-83) 176/80 (07/26 0502) SpO2:  [97 %-99 %] 99 % (07/26 0502) Weight:  [97.4 kg (214 lb 11.2 oz)-104.7 kg (230 lb 12.8 oz)] 97.4 kg (214 lb 11.2 oz) (07/26 0453) Weight change: 6.713 kg (14 lb 12.8 oz)  Intake/Output from previous day: 07/25 0701 - 07/26 0700 In: 920 [P.O.:920] Out: 275 [Urine:275] Intake/Output this shift: No intake/output data recorded.  General appearance: alert, cooperative and memory limited  Resp decreased bs CV irreg Gr2/6 M Abdm soft, pos bs. Obese, renal Tx Extrem grangrenous L great toe   Lab Results:  Recent Labs  10/29/15 0631 10/30/15 0358  WBC 9.5 9.6  HGB 13.3 13.4  HCT 40.4 41.7  PLT 268 282   BMET:  Recent Labs  10/29/15 0631 10/30/15 0358  NA 135 137  138  K 3.7 4.2  4.2  CL 101 104  102  CO2 26 27  27   GLUCOSE 188* 225*  218*  BUN 34* 31*  30*  CREATININE 2.30* 2.14*  1.99*  CALCIUM 7.9* 8.4*  8.5*   No results for input(s): PTH in the last 72 hours. Iron Studies:  Recent Labs  10/29/15 1233  IRON 28*  TIBC 189*    Studies/Results: Dg Foot Complete Left  Result Date: 10/28/2015 CLINICAL DATA:  Left big toe pain. EXAM: LEFT FOOT - COMPLETE 3+ VIEW COMPARISON:  None. FINDINGS: There is no evidence of fracture or dislocation. No evidence of dystrophic bony changes. There is soft tissue swelling of the distal first digit. Punctate lucencies within the area of soft tissue swelling may represent entrapped fat or potentially foci of gas. Heavy vascular calcifications are seen. IMPRESSION: No evidence of fracture of the left foot. Soft tissue swelling of the distal first digit with punctate lucencies within the area of swelling, which may represent entrapped fat or  potentially foci of gas. Please correlate clinically regarding the possibility of cellulitis. Electronically Signed   By: Fidela Salisbury M.D.   On: 10/28/2015 21:01   I have reviewed the patient's current medications.  Assessment/Plan: 1 REnal TX with CKD4  Cr stable . Will stop ivf as bp ^, not indic for KCl.  At risk with dye. Prograf P 2 Fe defic bolus 3 HTN lower vol 4 HPTH level pending 5 DM controlled P Prograf level, stop ivf, stop KCl, follow INR 4 PVD per [facility] 5 Cellulitis 6 Gangrenous toe    LOS: 2 days   Taylyn Brame L 10/30/2015,8:06 AM

## 2015-10-30 NOTE — Progress Notes (Addendum)
[facility] PROGRESS NOTE  Mark [name] [id number] DOB: [birth year] DOA: 10/28/2015   PCP: Rochel Brome, MD     Assessment/Plan: Principal Problem:   Gangrene of toe ([place]) Active Problems:   Type II diabetes mellitus, uncontrolled ([place])   Renal transplant recipient   Diabetic ulcer of left great toe ([place])   Cellulitis of great toe of left foot   Mark [name] is a 75 y.o. 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  Patient presents to ED with c/o 'his toe is dead'.  This onset about 1 month ago.  Finally saw doctor today after he fell getting out of the shower this morning.  His doctor was concerned about infection and sent him to the ED.  No fever no vomiting.  ED Course:  Toe is grossly infected and necrotic  . 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 Diabetic ulcer pathway.                   requested podiatry eval did recommended orthopedic consult, ABI shows no blood flow to the left toe, orthopedics and vascular surgery have been consulted Requested Dr. Sharol Given to consult, he recommends vascular surgery consult, requested Dr. Trula Slade to see ,  according to him The patient will need an arteriogram to evaluate blood flow to his left foot. Situation complicated by renal transplant with elevated creatinine     Chronic kidney disease stage IV, status post renal transplant                       BMP repeat in AM, creatinine has been trending up over the past year, but improved since admission, continue gentle IV hydration, follow creatinine closely. Post angiogram                       Continue anti-rejection meds, appreciate [name] following along  during this hospitalization, Prograf level is pending   A fib - on coumadin, rate controlled , will get preope echo ekg, tele , cont digoxin, metoprolol, hold coumadin, heparin when INR <2.0    DM2 -                        increase Lantus to 36 units, add pre-meal NovoLog 5 units                       Moderate dose mealtime and SSI AC/HS Hemoglobin A1c 8.2    Hypothyroidism-continue Synthroid  Hypertension-continue metoprolol  Dyslipidemia-continue Crestor    DVT prophylaxis: holding Coumadin, convert to heparin gtt when subtheraputic  Code Status:  Full code     Family Communication: Discussed in detail with the patient, all imaging results, lab results explained to the patient   Disposition Plan: Nephrology, orthopedic/podiatry consult, needs angiogram/revascularization/toe amputation     Consultants: Nephrology Podiatry Vascular  Procedures: None  Antibiotics: Anti-infectives    Start     Dose/Rate Route Frequency Ordered Stop   10/29/15 2200  vancomycin (  VANCOCIN) IVPB 1000 mg/200 mL premix     1,000 mg 200 mL/hr over 60 Minutes Intravenous Every 24 hours 10/28/15 2154     10/28/15 2200  piperacillin-tazobactam (ZOSYN) IVPB 3.375 g     3.375 g 12.5 mL/hr over 240 Minutes Intravenous Every 8 hours 10/28/15 2120        HPI/Subjective: Hemodynamically stable, no chest pain or shortness of breath   Objective: Vitals:   10/29/15 2052 10/30/15 0453 10/30/15 0502 10/30/15 0830  BP: (!) 178/70  (!) 176/80 (!) 176/72  Pulse: 86  92 63  Resp: 17  17 18   Temp: 98.5 F (36.9 C)  98.1 F (36.7 C) 98 F (36.7 C)  TempSrc: Oral  Oral Oral  SpO2: 97%  99% 100%  Weight: 104.7 kg (230 lb 12.8 oz) 97.4 kg (214 lb 11.2 oz)    Height:        Intake/Output Summary (Last 24 hours) at 10/30/15 1124 Last data filed at 10/30/15 0900  Gross per 24 hour  Intake              920 ml  Output              625 ml  Net              295 ml     Exam:  Examination:  General exam: Appears calm and comfortable  Respiratory system: Clear to auscultation. Respiratory effort normal. Cardiovascular system: S1 & S2 heard, RRR. No JVD, murmurs, rubs, gallops or clicks. No pedal edema. Gastrointestinal system: Abdomen is nondistended, soft and nontender. No organomegaly or masses felt. Normal bowel sounds heard. Central nervous system: Alert and oriented. No focal neurological deficits. Extremities:  Left leg is more swollen than right, dry gangrene of the left toe Skin: No rashes, lesions or ulcers Psychiatry: Judgement and insight appear normal. Mood & affect appropriate.     Data Reviewed: I have personally reviewed following labs and imaging studies  Micro Results Recent Results (from the past 240 hour(s))  Blood Cultures x 2 sites     Status: None (Preliminary result)   Collection Time: 10/28/15 10:00 PM  Result Value Ref Range Status   Specimen Description BLOOD RIGHT FOREARM  Final   Special Requests BOTTLES DRAWN AEROBIC AND ANAEROBIC 4CC  Final   Culture NO GROWTH < 24 HOURS  Final   Report Status PENDING  Incomplete  Blood Cultures x 2 sites     Status: None (Preliminary result)   Collection Time: 10/28/15 11:04 PM  Result Value Ref Range Status   Specimen Description BLOOD LEFT ARM  Final   Special Requests AEROBIC BOTTLE ONLY 7ML  Final   Culture NO GROWTH < 24 HOURS  Final   Report Status PENDING  Incomplete    Radiology Reports Dg Foot Complete Left  Result Date: 10/28/2015 CLINICAL DATA:  Left big toe pain. EXAM: LEFT FOOT - COMPLETE 3+ VIEW COMPARISON:  None. FINDINGS: There is no evidence of fracture or dislocation. No evidence of dystrophic bony changes. There is soft tissue swelling of the distal first digit. Punctate lucencies within the area of soft tissue swelling may represent entrapped fat or potentially foci of gas. Heavy vascular calcifications are seen. IMPRESSION: No evidence of fracture of the  left foot. Soft tissue swelling of the distal first digit with punctate lucencies within the area of swelling, which may represent entrapped fat or potentially foci of gas. Please correlate clinically regarding the possibility of cellulitis. Electronically  Signed   By: Fidela Salisbury M.D.   On: 10/28/2015 21:01    CBC  Recent Labs Lab 10/28/15 1919 10/29/15 0631 10/30/15 0358  WBC 14.3* 9.5 9.6  HGB 15.0 13.3 13.4  HCT 44.9 40.4 41.7  PLT 327 268 282  MCV 93.2 92.9 93.9  MCH 31.1 30.6 30.2  MCHC 33.4 32.9 32.1  RDW 14.3 14.1 14.1  LYMPHSABS 1.9  --   --   MONOABS 0.9  --   --   EOSABS 0.0  --   --   BASOSABS 0.0  --   --     Chemistries   Recent Labs Lab 10/28/15 1919 10/29/15 0631 10/29/15 1233 10/30/15 0358  NA 134* 135  --  137  138  K 4.1 3.7  --  4.2  4.2  CL 96* 101  --  104  102  CO2 27 26  --  27  27  GLUCOSE 230* 188*  --  225*  218*  BUN 34* 34*  --  31*  30*  CREATININE 2.60* 2.30*  --  2.14*  1.99*  CALCIUM 8.7* 7.9*  --  8.4*  8.5*  MG  --   --  1.9  --   AST 32  --   --  22  ALT 15*  --   --  13*  ALKPHOS 60  --   --  53  BILITOT 1.7*  --   --  1.2   ------------------------------------------------------------------------------------------------------------------ estimated creatinine clearance is 34.3 mL/min (by C-G formula based on SCr of 2.14 mg/dL). ------------------------------------------------------------------------------------------------------------------  Recent Labs  10/28/15 2305 10/29/15 1207  HGBA1C 8.1* 8.2*   ------------------------------------------------------------------------------------------------------------------ No results for input(s): CHOL, HDL, LDLCALC, TRIG, CHOLHDL, LDLDIRECT in the last 72 hours. ------------------------------------------------------------------------------------------------------------------ No results for input(s): TSH, T4TOTAL, T3FREE, THYROIDAB in the last 72 hours.  Invalid  input(s): FREET3 ------------------------------------------------------------------------------------------------------------------  Recent Labs  10/29/15 1233  TIBC 189*  IRON 28*    Coagulation profile  Recent Labs Lab 10/28/15 2305 10/29/15 0828 10/30/15 0358  INR 2.68* 2.75* 2.56    No results for input(s): DDIMER in the last 72 hours.  Cardiac Enzymes No results for input(s): CKMB, TROPONINI, MYOGLOBIN in the last 168 hours.  Invalid input(s): CK ------------------------------------------------------------------------------------------------------------------ Invalid input(s): POCBNP   CBG:  Recent Labs Lab 10/29/15 0742 10/29/15 1147 10/29/15 1714 10/29/15 2051 10/30/15 0751  GLUCAP 179* 188* 200* 233* 179*       Studies: Dg Foot Complete Left  Result Date: 10/28/2015 CLINICAL DATA:  Left big toe pain. EXAM: LEFT FOOT - COMPLETE 3+ VIEW COMPARISON:  None. FINDINGS: There is no evidence of fracture or dislocation. No evidence of dystrophic bony changes. There is soft tissue swelling of the distal first digit. Punctate lucencies within the area of soft tissue swelling may represent entrapped fat or potentially foci of gas. Heavy vascular calcifications are seen. IMPRESSION: No evidence of fracture of the left foot. Soft tissue swelling of the distal first digit with punctate lucencies within the area of swelling, which may represent entrapped fat or potentially foci of gas. Please correlate clinically regarding the possibility of cellulitis. Electronically Signed   By: Fidela Salisbury M.D.   On: 10/28/2015 21:01     Lab Results  Component Value Date   HGBA1C 8.2 (H) 10/29/2015   HGBA1C 8.1 (H) 10/28/2015   HGBA1C 8.8 06/06/2015   Lab Results  Component Value Date   MICROALBUR 88.1 (H) 06/06/2015   LDLCALC 88 09/27/2013   CREATININE 2.14 (  H) 10/30/2015   CREATININE 1.99 (H) 10/30/2015       Scheduled Meds: . allopurinol  150 mg Oral Daily   . calcitRIOL  0.5 mcg Oral Daily  . digoxin  0.125 mg Oral Daily  . famotidine  20 mg Oral QHS  . feeding supplement (GLUCERNA SHAKE)  237 mL Oral Q1500  . feeding supplement (PRO-STAT SUGAR FREE 64)  30 mL Oral BID BM  . insulin aspart  0-15 Units Subcutaneous TID WC  . insulin aspart  4 Units Subcutaneous TID WC  . insulin glargine  30 Units Subcutaneous QHS  . levothyroxine  137 mcg Oral QAC breakfast  . magnesium oxide  400 mg Oral BID  . metoprolol succinate  50 mg Oral Daily  . phosphorus  250 mg Oral BID  . piperacillin-tazobactam (ZOSYN)  IV  3.375 g Intravenous Q8H  . predniSONE  5 mg Oral Daily  . rosuvastatin  10 mg Oral QPM  . tacrolimus  0.5 mg Oral BID  . vancomycin  1,000 mg Intravenous Q24H   Continuous Infusions:    LOS: 2 days    Time spent: >30 MINS    [facility]  [facility] Pager [phone]. If 7PM-7AM, please contact night-coverage at [email or web address], password [facility] 10/30/2015, 11:24 AM  LOS: 2 days

## 2015-10-30 NOTE — Progress Notes (Signed)
Inpatient Diabetes Program Recommendations  AACE/ADA: New Consensus Statement on Inpatient Glycemic Control (2015)  Target Ranges:  Prepandial:   less than 140 mg/dL      Peak postprandial:   less than 180 mg/dL (1-2 hours)      Critically ill patients:  140 - 180 mg/dL   Lab Results  Component Value Date   GLUCAP 179 (H) 10/30/2015   HGBA1C 8.2 (H) 10/29/2015    Review of Glycemic Control Late Entry   Spoke with pt and sister-in-law at length on 7/25 in the afternoon. Pt states he checks his blood sugars 3-4 times/day, although sister-in-law was shaking her head no. Verbalized he was given prescription for U-500, but did not fill. Occasionally has low blood sugars in the morning. Likes the V-Go pump. Uses Humulin R insulin in pump. Eats out several times/week and does not cover for [facility] then. Needs close f/u with PCP/endo for diabetes management. Discussed hypoglycemia, s/s and treatment. Pt seems to want staff to think he "knows it all" with regards to diabetes. Continued to comment about price of insulins, although he is now on Regular insulin which is $24.99. Encouraged pt to check blood sugars more frequently and take sugar log to MD office for any needed adjustments. Discussed HgbA1C and goals.    Inpatient Diabetes Program Recommendations:    Increase Lantus to 36 units QHS Increase Novolog to 6 units tidwc for meal coverage insulin.  Will continue to follow while inpatient. To put pump back on after discharge.  Thank you. Lorenda Peck, RD, LDN, CDE Inpatient Diabetes Coordinator [phone]

## 2015-10-30 NOTE — Progress Notes (Signed)
[place] for warfarin>>Heparin Indication: atrial fibrillation  Allergies  Allergen Reactions  . Tape Other (See Comments)    SKIN WILL TEAR!!    Patient Measurements: Height: _0  (175.3 cm) Weight: 214 lb 11.2 oz (97.4 kg) IBW/kg (Calculated) : 70.7 Heparin Dosing Weight: 90 kg  Vital Signs: Temp: 98 F (36.7 C) (07/26 0830) Temp Source: Oral (07/26 0830) BP: 176/72 (07/26 0830) Pulse Rate: 63 (07/26 0830)  Labs:  Recent Labs  10/28/15 1919 10/28/15 2305 10/29/15 0631 10/29/15 0828 10/29/15 1233 10/30/15 0358  HGB 15.0  --  13.3  --   --  13.4  HCT 44.9  --  40.4  --   --  41.7  PLT 327  --  268  --   --  282  APTT  --   --   --   --  42*  --   LABPROT  --  28.1*  --  28.7*  --  27.2*  INR  --  2.68*  --  2.75*  --  2.56  CREATININE 2.60*  --  2.30*  --   --  2.14*  1.99*    Estimated Creatinine Clearance: 34.3 mL/min (by C-G formula based on SCr of 2.14 mg/dL).   Medical History: Past Medical History:  Diagnosis Date  . Atrial fibrillation ([place])   . Diabetes ([place])   . Gout   . Hypertension   . Hypothyroidism   . Renal disorder    kidney transplant   . Ulcer of other part of foot 12/15/2013    Medications:  Prescriptions Prior to Admission  Medication Sig Dispense Refill Last Dose  . acetaminophen (TYLENOL) 500 MG tablet Take 500 mg by mouth every 6 (six) hours as needed for mild pain or moderate pain.   Past Month at Unknown time  . allopurinol (ZYLOPRIM) 300 MG tablet TAKE 1/2 OF A TABLET BY MOUTH ONCE DAILY. (Patient taking differently: Take 150 mg by mouth in the morning) 15 tablet 5 10/27/2015 at 0900  . BD PEN NEEDLE NANO U/F 32G X 4 MM MISC USE AS DIRECTED 5 TIMES DAILY SUBQUE. 200 each 1 Taking  . calcitRIOL (ROCALTROL) 0.5 MCG capsule Take one by mouth daily   10/28/2015 at 1500  . DIGOX 0.125 MG tablet Take one by mouth daily   10/27/2015 at pm  . fenofibrate micronized (LOFIBRA) 134 MG capsule TAKE ONE  CAPSULE EVERY DAY WITH A MEAL (Patient taking differently: Take 1 capsule by mouth in the morning on Mon/Wed/Fri) 90 capsule 1 10/28/2015 at am  . furosemide (LASIX) 80 MG tablet Take 200 mg by mouth 2 (two) times daily.    10/28/2015 at 1500  . Insulin Disposable Pump (V-GO 40) KIT Use one per day (Patient taking differently: As directed) 30 kit 3   . insulin regular (NOVOLIN R) 100 units/mL injection Inject 78 Units into the skin See admin instructions. (Daily) Via pump V-Go 40   continuous at continuous  . Insulin Syringe-Needle U-100 (INSULIN SYRINGE .3CC/31GX5/16") 31G X 5/16" 0.3 ML MISC Use as directed to inject insulin 100 each 3 Taking  . ketoconazole (NIZORAL) 2 % shampoo Apply 1 application topically 2 (two) times a week.    Past Month at Unknown time  . levothyroxine (SYNTHROID, LEVOTHROID) 137 MCG tablet Take 137 mcg by mouth daily before breakfast.   10/28/2015 at 1500  . magnesium oxide (MAG-OX) 400 MG tablet Take 400 mg by mouth 2 (two) times daily.  10/28/2015 at 1500  . metoprolol succinate (TOPROL-XL) 50 MG 24 hr tablet Take 50 mg by mouth daily.    10/28/2015 at 1500  . mupirocin ointment (BACTROBAN) 2 % Apply 1 application topically daily as needed.   0 Taking  . nystatin (MYCOSTATIN/NYSTOP) 100000 UNIT/GM POWD Apply topically daily as needed.    Taking  . omeprazole (PRILOSEC) 20 MG capsule Take 20 mg by mouth daily.   10/28/2015 at 1500  . ONETOUCH DELICA LANCETS FINE MISC Use to check blood sugar 4 times per day dx code 250.02 200 each 3 Taking  . ONETOUCH VERIO test strip USE AS INSTRUCTED TO CHECK BLOOD SUGAR 4 TIMES A DAY 150 each 3 Taking  . PHOSPHA 250 NEUTRAL 155-852-130 MG tablet Take 250 mg by mouth 2 (two) times daily.   12 10/28/2015 at 1500  . potassium chloride SA (K-DUR,KLOR-CON) 20 MEQ tablet Take 50 mEq by mouth 2 (two) times daily.    10/28/2015 at 1500  . predniSONE (DELTASONE) 5 MG tablet Take 5 mg by mouth daily.  0 10/28/2015 at 1500  . rosuvastatin (CRESTOR) 10  MG tablet Take 10 mg by mouth every evening.   10/28/2015 at 1500  . tacrolimus (PROGRAF) 0.5 MG capsule Take 0.5 mg by mouth 2 (two) times daily.   10/28/2015 at 1500  . warfarin (COUMADIN) 5 MG tablet Take 2.5-5 mg by mouth See admin instructions. 5 mg in the evening on Sun/Mon/Wed/Fri/Sat and 2.5 mg on Tues/Thurs   10/27/2015 at 1700    Assessment: 75 y.o. male with h/o Afib, Coumadin on hold for toe amputation, start heparin once INR < 2.0.  INR trending down slowly to 2.56. Hgb, plt wnl. Last dose 7/23 evening.  Goal of Therapy:  Heparin level 0.3-0.7 units/ml Monitor platelets by anticoagulation protocol: Yes   Plan:  -Follow daily INR  -Start heparin once INR < 2  Thank you for allowing [place] to participate in this patients care. Jens Som, PharmD Pager: [phone] 10/30/2015 11:41 AM

## 2015-10-31 ENCOUNTER — Inpatient Hospital Stay (HOSPITAL_COMMUNITY): Payer: Medicare Other

## 2015-10-31 ENCOUNTER — Encounter (HOSPITAL_BASED_OUTPATIENT_CLINIC_OR_DEPARTMENT_OTHER): Payer: Medicare Other

## 2015-10-31 DIAGNOSIS — Z01810 Encounter for preprocedural cardiovascular examination: Secondary | ICD-10-CM

## 2015-10-31 DIAGNOSIS — M79605 Pain in left leg: Secondary | ICD-10-CM

## 2015-10-31 DIAGNOSIS — M79604 Pain in right leg: Secondary | ICD-10-CM

## 2015-10-31 LAB — COMPREHENSIVE METABOLIC PANEL
ALK PHOS: 50 U/L (ref 38–126)
ALT: 12 U/L — AB (ref 17–63)
ANION GAP: 6 (ref 5–15)
AST: 19 U/L (ref 15–41)
Albumin: 1.9 g/dL — ABNORMAL LOW (ref 3.5–5.0)
BILIRUBIN TOTAL: 1.3 mg/dL — AB (ref 0.3–1.2)
BUN: 26 mg/dL — AB (ref 6–20)
CHLORIDE: 104 mmol/L (ref 101–111)
CO2: 30 mmol/L (ref 22–32)
Calcium: 8.7 mg/dL — ABNORMAL LOW (ref 8.9–10.3)
Creatinine, Ser: 1.85 mg/dL — ABNORMAL HIGH (ref 0.61–1.24)
GFR, EST AFRICAN AMERICAN: 39 mL/min — AB (ref >60)
GFR, EST NON AFRICAN AMERICAN: 34 mL/min — AB (ref >60)
GLUCOSE: 147 mg/dL — AB (ref 65–99)
POTASSIUM: 4.2 mmol/L (ref 3.5–5.1)
Sodium: 140 mmol/L (ref 135–145)
TOTAL PROTEIN: 5.5 g/dL — AB (ref 6.5–8.1)

## 2015-10-31 LAB — PROTIME-INR
INR: 2.18
INR: 2.14
Prothrombin Time: 24.6 seconds — ABNORMAL HIGH (ref 11.4–15.2)
Prothrombin Time: 24.3 seconds — ABNORMAL HIGH (ref 11.4–15.2)

## 2015-10-31 LAB — GLUCOSE, CAPILLARY
GLUCOSE-CAPILLARY: 116 mg/dL — AB (ref 65–99)
GLUCOSE-CAPILLARY: 186 mg/dL — AB (ref 65–99)
GLUCOSE-CAPILLARY: 158 mg/dL — AB (ref 65–99)
Glucose-Capillary: 156 mg/dL — ABNORMAL HIGH (ref 65–99)

## 2015-10-31 LAB — CBC
HEMATOCRIT: 39.9 % (ref 39.0–52.0)
HEMOGLOBIN: 13.2 g/dL (ref 13.0–17.0)
MCH: 30.8 pg (ref 26.0–34.0)
MCHC: 33.1 g/dL (ref 30.0–36.0)
MCV: 93 fL (ref 78.0–100.0)
Platelets: 275 10*3/uL (ref 150–400)
RBC: 4.29 MIL/uL (ref 4.22–5.81)
RDW: 14.2 % (ref 11.5–15.5)
WBC: 8.5 10*3/uL (ref 4.0–10.5)

## 2015-10-31 LAB — ECHOCARDIOGRAM COMPLETE
HEIGHTINCHES: 69 in
WEIGHTICAEL: 3527.36 [oz_av]

## 2015-10-31 LAB — VANCOMYCIN, TROUGH: VANCOMYCIN TR: 15 ug/mL (ref 15–20)

## 2015-10-31 LAB — TACROLIMUS LEVEL
Tacrolimus (FK506) - LabCorp: 2.7 ng/mL (ref 2.0–20.0)
Tacrolimus (FK506) - LabCorp: 6 ng/mL (ref 2.0–20.0)

## 2015-10-31 MED ORDER — TACROLIMUS 1 MG PO CAPS
1.0000 mg | ORAL_CAPSULE | Freq: Two times a day (BID) | ORAL | Status: DC
  Administered 2015-10-31 – 2015-11-23 (×47): 1 mg via ORAL
  Filled 2015-10-31 (×48): qty 1

## 2015-10-31 MED ORDER — METOPROLOL SUCCINATE ER 100 MG PO TB24
100.0000 mg | ORAL_TABLET | Freq: Every day | ORAL | Status: DC
  Administered 2015-10-31 – 2015-11-24 (×25): 100 mg via ORAL
  Filled 2015-10-31 (×7): qty 1
  Filled 2015-10-31: qty 2
  Filled 2015-10-31 (×17): qty 1

## 2015-10-31 MED ORDER — HYDRALAZINE HCL 20 MG/ML IJ SOLN
10.0000 mg | Freq: Once | INTRAMUSCULAR | Status: AC
  Administered 2015-10-31: 10 mg via INTRAVENOUS
  Filled 2015-10-31: qty 1

## 2015-10-31 MED ORDER — HYDRALAZINE HCL 20 MG/ML IJ SOLN
5.0000 mg | INTRAMUSCULAR | Status: DC | PRN
  Administered 2015-11-01: 5 mg via INTRAVENOUS
  Filled 2015-10-31: qty 1

## 2015-10-31 MED ORDER — SODIUM CHLORIDE 0.9 % IV SOLN
INTRAVENOUS | Status: DC
  Administered 2015-10-31 – 2015-11-04 (×8): via INTRAVENOUS

## 2015-10-31 NOTE — Progress Notes (Signed)
  ANTICOAGULATION CONSULT NOTE - Follow Up Consult  Pharmacy Consult for coumadin > heparin Indication: atrial fibrillation  Allergies  Allergen Reactions  . Tape Other (See Comments)    SKIN WILL TEAR!!    Patient Measurements: Height: 5\' 9"  (175.3 cm) Weight: 220 lb 7.4 oz (100 kg) IBW/kg (Calculated) : 70.7 Heparin Dosing Weight: 90 kg  Vital Signs: Temp: 97.7 F (36.5 C) (07/27 1635) Temp Source: Oral (07/27 1635) BP: 164/72 (07/27 1635) Pulse Rate: 75 (07/27 1635)  Labs:  Recent Labs  10/29/15 0631  10/29/15 1233 10/30/15 0358 10/31/15 0430 10/31/15 1644  HGB 13.3  --   --  13.4 13.2  --   HCT 40.4  --   --  41.7 39.9  --   PLT 268  --   --  282 275  --   APTT  --   --  42*  --   --   --   LABPROT  --   < >  --  27.2* 24.6* 24.3*  INR  --   < >  --  2.56 2.18 2.14  CREATININE 2.30*  --   --  2.14*  1.99* 1.85*  --   < > = values in this interval not displayed.  Estimated Creatinine Clearance: 40.2 mL/min (by C-G formula based on SCr of 1.85 mg/dL).   Medications:  Scheduled:  . allopurinol  150 mg Oral Daily  . calcitRIOL  0.5 mcg Oral Daily  . digoxin  0.125 mg Oral Daily  . famotidine  20 mg Oral QHS  . feeding supplement (GLUCERNA SHAKE)  237 mL Oral Q1500  . feeding supplement (PRO-STAT SUGAR FREE 64)  30 mL Oral BID BM  . insulin aspart  0-15 Units Subcutaneous TID WC  . insulin aspart  5 Units Subcutaneous TID WC  . insulin glargine  36 Units Subcutaneous QHS  . levothyroxine  137 mcg Oral QAC breakfast  . magnesium oxide  400 mg Oral BID  . metoprolol succinate  100 mg Oral Daily  . phosphorus  250 mg Oral BID  . piperacillin-tazobactam (ZOSYN)  IV  3.375 g Intravenous Q8H  . predniSONE  5 mg Oral Daily  . rosuvastatin  10 mg Oral QPM  . tacrolimus  1 mg Oral BID  . vancomycin  1,000 mg Intravenous Q24H   Infusions:  . sodium chloride 75 mL/hr at 10/31/15 1000    Assessment: 75 yo male with afib is currently on therapeutic coumadin.   INR is still 2.14.  Goal of Therapy:  Heparin level 0.3-0.7 units/ml Monitor platelets by anticoagulation protocol: Yes   Plan:  - Recheck INR in am. Start heparin if INR is <2  Cindi Ghazarian, Tsz-Yin 10/31/2015,5:19 PM

## 2015-10-31 NOTE — Progress Notes (Signed)
[place] for warfarin>>Heparin Indication: atrial fibrillation  Allergies  Allergen Reactions  . Tape Other (See Comments)    SKIN WILL TEAR!!    Patient Measurements: Height: '5\' 9"'  (175.3 cm) Weight: 220 lb 7.4 oz (100 kg) IBW/kg (Calculated) : 70.7 Heparin Dosing Weight: 90 kg  Vital Signs: Temp: 97.9 F (36.6 C) (07/27 0808) Temp Source: Oral (07/27 0808) BP: 212/96 (07/27 0808) Pulse Rate: 80 (07/27 0808)  Labs:  Recent Labs  10/29/15 0631 10/29/15 0828 10/29/15 1233 10/30/15 0358 10/31/15 0430  HGB 13.3  --   --  13.4 13.2  HCT 40.4  --   --  41.7 39.9  PLT 268  --   --  282 275  APTT  --   --  42*  --   --   LABPROT  --  28.7*  --  27.2* 24.6*  INR  --  2.75*  --  2.56 2.18  CREATININE 2.30*  --   --  2.14*  1.99* 1.85*    Estimated Creatinine Clearance: 40.2 mL/min (by C-G formula based on SCr of 1.85 mg/dL).   Medical History: Past Medical History:  Diagnosis Date  . Atrial fibrillation ([place])   . Diabetes ([place])   . Gout   . Hypertension   . Hypothyroidism   . Renal disorder    kidney transplant   . Ulcer of other part of foot 12/15/2013    Medications:  Prescriptions Prior to Admission  Medication Sig Dispense Refill Last Dose  . acetaminophen (TYLENOL) 500 MG tablet Take 500 mg by mouth every 6 (six) hours as needed for mild pain or moderate pain.   Past Month at Unknown time  . allopurinol (ZYLOPRIM) 300 MG tablet TAKE 1/2 OF A TABLET BY MOUTH ONCE DAILY. (Patient taking differently: Take 150 mg by mouth in the morning) 15 tablet 5 10/27/2015 at 0900  . BD PEN NEEDLE NANO U/F 32G X 4 MM MISC USE AS DIRECTED 5 TIMES DAILY SUBQUE. 200 each 1 Taking  . calcitRIOL (ROCALTROL) 0.5 MCG capsule Take one by mouth daily   10/28/2015 at 1500  . DIGOX 0.125 MG tablet Take one by mouth daily   10/27/2015 at pm  . fenofibrate micronized (LOFIBRA) 134 MG capsule TAKE ONE CAPSULE EVERY DAY WITH A MEAL (Patient taking  differently: Take 1 capsule by mouth in the morning on Mon/Wed/Fri) 90 capsule 1 10/28/2015 at am  . furosemide (LASIX) 80 MG tablet Take 200 mg by mouth 2 (two) times daily.    10/28/2015 at 1500  . Insulin Disposable Pump (V-GO 40) KIT Use one per day (Patient taking differently: As directed) 30 kit 3   . insulin regular (NOVOLIN R) 100 units/mL injection Inject 78 Units into the skin See admin instructions. (Daily) Via pump V-Go 40   continuous at continuous  . Insulin Syringe-Needle U-100 (INSULIN SYRINGE .3CC/31GX5/16") 31G X 5/16" 0.3 ML MISC Use as directed to inject insulin 100 each 3 Taking  . ketoconazole (NIZORAL) 2 % shampoo Apply 1 application topically 2 (two) times a week.    Past Month at Unknown time  . levothyroxine (SYNTHROID, LEVOTHROID) 137 MCG tablet Take 137 mcg by mouth daily before breakfast.   10/28/2015 at 1500  . magnesium oxide (MAG-OX) 400 MG tablet Take 400 mg by mouth 2 (two) times daily.   10/28/2015 at 1500  . metoprolol succinate (TOPROL-XL) 50 MG 24 hr tablet Take 50 mg by mouth daily.    10/28/2015  at 1500  . mupirocin ointment (BACTROBAN) 2 % Apply 1 application topically daily as needed.   0 Taking  . nystatin (MYCOSTATIN/NYSTOP) 100000 UNIT/GM POWD Apply topically daily as needed.    Taking  . omeprazole (PRILOSEC) 20 MG capsule Take 20 mg by mouth daily.   10/28/2015 at 1500  . ONETOUCH DELICA LANCETS FINE MISC Use to check blood sugar 4 times per day dx code 250.02 200 each 3 Taking  . ONETOUCH VERIO test strip USE AS INSTRUCTED TO CHECK BLOOD SUGAR 4 TIMES A DAY 150 each 3 Taking  . PHOSPHA 250 NEUTRAL 155-852-130 MG tablet Take 250 mg by mouth 2 (two) times daily.   12 10/28/2015 at 1500  . potassium chloride SA (K-DUR,KLOR-CON) 20 MEQ tablet Take 50 mEq by mouth 2 (two) times daily.    10/28/2015 at 1500  . predniSONE (DELTASONE) 5 MG tablet Take 5 mg by mouth daily.  0 10/28/2015 at 1500  . rosuvastatin (CRESTOR) 10 MG tablet Take 10 mg by mouth every evening.    10/28/2015 at 1500  . tacrolimus (PROGRAF) 0.5 MG capsule Take 0.5 mg by mouth 2 (two) times daily.   10/28/2015 at 1500  . warfarin (COUMADIN) 5 MG tablet Take 2.5-5 mg by mouth See admin instructions. 5 mg in the evening on Sun/Mon/Wed/Fri/Sat and 2.5 mg on Tues/Thurs   10/27/2015 at 1700    Assessment: 75 y.o. male with h/o Afib, Coumadin on hold for toe amputation, start heparin once INR < 2.0.  INR trending down slowly to 2.18. Hgb, plt wnl. No bleeding noted. Last dose 7/23 evening.  Goal of Therapy:  Heparin level 0.3-0.7 units/ml Monitor platelets by anticoagulation protocol: Yes   Plan:  -Repeat INR at 1700 today -Start heparin once INR < 2  Thank you for allowing [place] to participate in this patients care. Carlean Jews, Pharm.D. PGY1 Pharmacy Resident 7/27/20179:05 AM Pager [phone]

## 2015-10-31 NOTE — Progress Notes (Signed)
  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  CBC    Component Value Date/Time   WBC 8.5 10/31/2015 0430   RBC 4.29 10/31/2015 0430   HGB 13.2 10/31/2015 0430   HCT 39.9 10/31/2015 0430   PLT 275 10/31/2015 0430   MCV 93.0 10/31/2015 0430   MCH 30.8 10/31/2015 0430   MCHC 33.1 10/31/2015 0430   RDW 14.2 10/31/2015 0430   LYMPHSABS 1.9 10/28/2015 1919   MONOABS 0.9 10/28/2015 1919   EOSABS 0.0 10/28/2015 1919   BASOSABS 0.0 10/28/2015 1919    BMET    Component Value Date/Time   NA 140 10/31/2015 0430   K 4.2 10/31/2015 0430   CL 104 10/31/2015 0430   CO2 30 10/31/2015 0430   GLUCOSE 147 (H) 10/31/2015 0430   BUN 26 (H) 10/31/2015 0430   CREATININE 1.85 (H) 10/31/2015 0430   CALCIUM 8.7 (L) 10/31/2015 0430   GFRNONAA 34 (L) 10/31/2015 0430   GFRAA 39 (L) 10/31/2015 0430    INR    Component Value Date/Time   INR 2.18 10/31/2015 0430     Intake/Output Summary (Last 24 hours) at 10/31/15 0809 Last data filed at 10/31/15 0557  Gross per 24 hour  Intake              720 ml  Output              350 ml  Net              370 ml     Assessment/Plan:  75 y.o. male with left gangrenous toe Hospital Day 3  -pt's creatinine is improved to 1.8, however, his INR is still 2.1.  Will plan for angiogram on Monday or Tuesday. -erythema of left foot appears a little worse today-continue Abx-pt is afebrile and normal WBC   Leontine Locket, PA-C [facility] [phone] 10/31/2015 8:09 AM

## 2015-10-31 NOTE — Progress Notes (Signed)
Echocardiogram 2D Echocardiogram has been performed.  Aggie Cosier 10/31/2015, 9:52 AM

## 2015-10-31 NOTE — Progress Notes (Signed)
Subjective: Interval History: has complaints  hungry and no food.  Objective: Vital signs in last 24 hours: Temp:  [97.5 F (36.4 C)-98.4 F (36.9 C)] 97.5 F (36.4 C) (07/27 0450) Pulse Rate:  [56-97] 56 (07/27 0723) Resp:  [18-20] 18 (07/27 0450) BP: (123-198)/(54-100) 171/64 (07/27 0723) SpO2:  [93 %-100 %] 99 % (07/27 0450) Weight:  [100 kg (220 lb 7.4 oz)] 100 kg (220 lb 7.4 oz) (07/26 2012) Weight change: -4.69 kg (-10 lb 5.4 oz)  Intake/Output from previous day: 07/26 0701 - 07/27 0700 In: 720 [P.O.:720] Out: 350 [Urine:350] Intake/Output this shift: No intake/output data recorded.  General appearance: alert and cooperative Resp: diminished breath sounds bilaterally Cardio: irregularly irregular rhythm, S1, S2 normal and systolic murmur: holosystolic 2/6, blowing at apex GI: obese, pos bs, soft, liver down 4 cm. TX LLQ Extremities: L ankle charcot, L foot reddened to mid foot, L great toe black with gangreene  Lab Results:  Recent Labs  10/30/15 0358 10/31/15 0430  WBC 9.6 8.5  HGB 13.4 13.2  HCT 41.7 39.9  PLT 282 275   BMET:  Recent Labs  10/30/15 0358 10/31/15 0430  NA 137  138 140  K 4.2  4.2 4.2  CL 104  102 104  CO2 27  27 30   GLUCOSE 225*  218* 147*  BUN 31*  30* 26*  CREATININE 2.14*  1.99* 1.85*  CALCIUM 8.4*  8.5* 8.7*    Recent Labs  10/29/15 1233  PTH 82*   Iron Studies:  Recent Labs  10/29/15 1233  IRON 28*  TIBC 189*    Studies/Results: No results found.  I have reviewed the patient's current medications.  Assessment/Plan: 1 Renal TX with CKD 4 stable. Prograf low, ^ dose, check in 1 wk 2 BP ^ ^ metop 3 DM per primary 4 mild confusion 5 Gangreene/cellulits per VVS , on AB 6 PVD 7 A fib anticoag, rate control P ^ Metop, ^ Prograf, will not follow formally at this time, no dye till next week per plan,     LOS: 3 days   Liv Rallis L 10/31/2015,8:02 AM

## 2015-10-31 NOTE — Progress Notes (Signed)
Pharmacy Antibiotic Note  Mark [name] is a 75 y.o. male admitted on 10/28/2015 with diabetic foot infection.  Pharmacy has been consulted for Vancocin and Zosyn dosing.  Vanc trough at goal.  Plan: This patient's current antibiotics will be continued without adjustments.  Height: 5\' 9"  (175.3 cm) Weight: 217 lb 2.5 oz (98.5 kg) IBW/kg (Calculated) : 70.7  Temp (24hrs), Avg:98 F (36.7 C), Min:97.5 F (36.4 C), Max:98.7 F (37.1 C)   Recent Labs Lab 10/28/15 1919 10/28/15 1940 10/29/15 0631 10/30/15 0358 10/31/15 0430 10/31/15 2155  WBC 14.3*  --  9.5 9.6 8.5  --   CREATININE 2.60*  --  2.30* 2.14*  1.99* 1.85*  --   LATICACIDVEN  --  3.47*  --   --   --   --   VANCOTROUGH  --   --   --   --   --  15    Estimated Creatinine Clearance: 39.9 mL/min (by C-G formula based on SCr of 1.85 mg/dL).    Allergies  Allergen Reactions  . Tape Other (See Comments)    SKIN WILL TEAR!!    Antimicrobials this admission: Zosyn 7/24>> Vancomycin 7/24>>   Microbiology results: 7/24 BCx: ngtd  Thank you for allowing pharmacy to be a part of this patient's care.  Wynona Neat, PharmD, BCPS  10/31/2015 11:35 PM

## 2015-10-31 NOTE — Progress Notes (Signed)
OT Cancellation Note  Patient Details Name: Mark [name] MRN: [id number] DOB: [birth year]   Cancelled Treatment:    Reason Eval/Treat Not Completed: Patient at procedure or test/ unavailable. Will re attempt later as able  Britt Bottom 10/31/2015, 10:06 AM

## 2015-10-31 NOTE — Progress Notes (Signed)
10 mg of hydralazine given for blood pressure 198/95 and 170/100 (manual).

## 2015-10-31 NOTE — Evaluation (Signed)
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 Pt admitted with gangrene of L toe.  Clinical Impression  Pt admitted with the above. He tolerated ambulation min guard, but distance was limited by pain in his LEs. Pt at baseline was mod I household distances with cane. Pt is aware of his LE sensation deficits but demonstrates decreased safety awareness. PT recommend SNF to address deficits in gait, mobility, balance, strength, and safety awareness to achieve mod I. If pt declines SNF, PT recommends supervision for a period of time upon returning home for safety. Acute PT to follow.     Follow Up Recommendations SNF;Supervision - Intermittent    Equipment Recommendations  Rolling walker with 5" wheels    Recommendations for Other Services       Precautions / Restrictions Precautions Precautions: Fall Precaution Comments: History of peripheral neuropathy. Pt states he can't really feel his feet. Restrictions Weight Bearing Restrictions: No      Mobility  Bed Mobility Overal bed mobility: Needs Assistance Bed Mobility: Sit to Supine       Sit to supine: Min assist   General bed mobility comments: Min A for sliding toward HOB  Transfers Overall transfer level: Needs assistance Equipment used: Rolling walker (2 wheeled) Transfers: Sit to/from Stand Sit to Stand: Min guard         General transfer comment: Pt transfer off of toilet min guard for safety  Ambulation/Gait Ambulation/Gait assistance: Min guard Ambulation Distance (Feet): 50 Feet Assistive device: Rolling walker (2 wheeled) Gait Pattern/deviations: Step-through pattern;Decreased stride length   Gait velocity interpretation: Below normal speed for  age/gender General Gait Details: Pt states he can't feel his feet but does have pain when ambulating.   Stairs            Wheelchair Mobility    Modified Rankin (Stroke Patients Only)       Balance Overall balance assessment: Needs assistance Sitting-balance support: Feet supported;No upper extremity supported Sitting balance-Leahy Scale: Good     Standing balance support: During functional activity;Bilateral upper extremity supported Standing balance-Leahy Scale: Fair Standing balance comment: Pt stand at sink to wash hands and brush teeth. Uses single UE support on sink while brushing teeth.                              Pertinent Vitals/Pain Pain Assessment: 0-10 Pain Score: 5  Pain Location: L foot  Pain Descriptors / Indicators: Aching;Discomfort Pain Intervention(s): Other (comment) (Pt took wanted socks off to air out his feet )    Home Living Family/patient expects to be discharged to:: Private residence Living Arrangements: Alone   Type of Home: House Home Access: Stairs to enter Entrance Stairs-Rails:  (Pt states he has rails but doesn't use them) Entrance Stairs-Number of Steps: Pt states "a couple" Home Layout: Two level;Able to live on main level with bedroom/bathroom        Prior Function Level of Independence: Independent with assistive device(s)         Comments: Single point cane     Hand Dominance   Dominant Hand: Right    Extremity/Trunk Assessment   Upper Extremity Assessment: Overall WFL for tasks assessed  Lower Extremity Assessment: RLE deficits/detail;LLE deficits/detail         Communication   Communication: No difficulties  Cognition Arousal/Alertness: Awake/alert Behavior During Therapy: WFL for tasks assessed/performed Overall Cognitive Status: Impaired/Different from baseline Area of Impairment: Safety/judgement         Safety/Judgement: Decreased awareness of safety           General Comments General comments (skin integrity, edema, etc.): Pt transfered min guard from toilet. Good navigation of obstacles around room.     Exercises        Assessment/Plan    PT Assessment Patient needs continued PT services  PT Diagnosis Abnormality of gait;Difficulty walking;Generalized weakness   PT Problem List Decreased strength;Decreased range of motion;Decreased activity tolerance;Decreased balance;Decreased mobility;Decreased coordination;Decreased knowledge of use of DME;Decreased safety awareness;Pain  PT Treatment Interventions DME instruction;Gait training;Stair training;Functional mobility training;Therapeutic activities;Therapeutic exercise;Balance training;Neuromuscular re-education;Patient/family education   PT Goals (Current goals can be found in the Care Plan section) Acute Rehab PT Goals Patient Stated Goal: to brush his teeth PT Goal Formulation: With patient Time For Goal Achievement: 11/07/15 Potential to Achieve Goals: Good    Frequency Min 2X/week   Barriers to discharge Decreased caregiver support Pt lives at home alone and has a high frequency of falls.    Co-evaluation               End of Session Equipment Utilized During Treatment: Gait belt Activity Tolerance: Patient limited by pain Patient left: in bed;with call bell/phone within reach;with bed alarm set Nurse Communication: Mobility status         Time: [id number] PT Time Calculation (min) (ACUTE ONLY): 23 min   Charges:   PT Evaluation $PT Eval Low Complexity: 1 Procedure PT Treatments $Gait Training: 8-22 mins   PT G Codes:        Gabriel Paulding 11/12/15, 12:11 PM Tawni Millers, SPT (student physical therapist) [place] [phone]

## 2015-10-31 NOTE — Progress Notes (Signed)
   10/31/15 1400  Clinical Encounter Type  Visited With Patient  Visit Type Spiritual support  Consult/Referral To Chaplain  Spiritual Encounters  Spiritual Needs Prayer  Stress Factors  Patient Stress Factors Health changes  Chaplain rounding visit made, provided spiritual presence,and prayer. Advised that support services are available 24/7 as needed.

## 2015-10-31 NOTE — Progress Notes (Signed)
Pharmacy Antibiotic Note  Mark [name] is a 75 y.o. male admitted on 10/28/2015 with diabetic foot infection.  Pharmacy has been consulted for zosyn and vancomycin dosing. Per vascular surgery - possible worsening of erythema on L foot, plan for angiogram on Monday or Tuesday. SCr slightly improved. WBCs remain wnl, afebrile.   Plan: Vancomycin 1000 IV every 24 hours.  Goal trough 15-20 mcg/mL. Zosyn 3.375g IV q8h (4 hour infusion). VT ordered tonight, adjust dose if necessary.   Height: 5\' 9"  (175.3 cm) Weight: 220 lb 7.4 oz (100 kg) IBW/kg (Calculated) : 70.7  Temp (24hrs), Avg:97.9 F (36.6 C), Min:97.5 F (36.4 C), Max:98.4 F (36.9 C)   Recent Labs Lab 10/28/15 1919 10/28/15 1940 10/29/15 0631 10/30/15 0358 10/31/15 0430  WBC 14.3*  --  9.5 9.6 8.5  CREATININE 2.60*  --  2.30* 2.14*  1.99* 1.85*  LATICACIDVEN  --  3.47*  --   --   --     Estimated Creatinine Clearance: 40.2 mL/min (by C-G formula based on SCr of 1.85 mg/dL).    Allergies  Allergen Reactions  . Tape Other (See Comments)    SKIN WILL TEAR!!    Carlean Jews, Pharm.D. PGY1 Pharmacy Resident 7/27/20179:12 AM Pager [phone]

## 2015-10-31 NOTE — Evaluation (Signed)
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 Pt admitted with gangrene of L toe.   Clinical Impression   Pt with decline in function and safety with ADLs and ADL mobility with decreased strength, balance, endurance and cognition. Pt would benefit from acute OT services to address impairments to increase level of function and safety    Follow Up Recommendations  SNF;Supervision/Assistance - 24 hour    Equipment Recommendations  Other (comment) (TBD at next level of care)    Recommendations for Other Services       Precautions / Restrictions Precautions Precautions: Fall Precaution Comments: History of peripheral neuropathy. Pt states he can't really feel his feet. Restrictions Weight Bearing Restrictions: No      Mobility Bed Mobility Overal bed mobility: Needs Assistance Bed Mobility: Sit to Supine;Supine to Sit     Supine to sit: Min assist Sit to supine: Min assist   General bed mobility comments: Min A for sliding toward HOB  Transfers Overall transfer level: Needs assistance Equipment used: Rolling walker (2 wheeled) Transfers: Sit to/from Stand Sit to Stand: Min guard         General transfer comment: Pt transfer off of toilet min guard for safety    Balance Overall balance assessment: Needs assistance Sitting-balance support: Feet supported;No upper extremity supported Sitting balance-Leahy Scale: Good     Standing balance support: During functional activity;Bilateral upper extremity supported Standing balance-Leahy Scale: Fair Standing balance comment: Pt stand at sink to wash hands and brush teeth. Uses single UE support on sink while brushing teeth.                             ADL  Overall ADL's : Needs assistance/impaired     Grooming: Wash/dry hands;Wash/dry face;Standing   Upper Body Bathing: Supervision/ safety;Set up;Sitting   Lower Body Bathing: Moderate assistance   Upper Body Dressing : Supervision/safety;Set up;Sitting   Lower Body Dressing: Maximal assistance   Toilet Transfer: Comfort height toilet;Ambulation;RW;Min guard   Toileting- Clothing Manipulation and Hygiene: Minimal assistance;Sit to/from stand       Functional mobility during ADLs: Min guard;Rolling walker       Vision  no change from baseline              Pertinent Vitals/Pain Pain Assessment: 0-10 Pain Score: 5  Pain Location: L foot Pain Descriptors / Indicators: Aching;Discomfort;Sore Pain Intervention(s): Monitored during session;Repositioned     Hand Dominance Right   Extremity/Trunk Assessment Upper Extremity Assessment Upper Extremity Assessment: Generalized weakness   Lower Extremity Assessment Lower Extremity Assessment: RLE deficits/detail;LLE deficits/detail RLE Sensation: history of peripheral neuropathy LLE Sensation: history of peripheral neuropathy       Communication Communication Communication: No difficulties   Cognition Arousal/Alertness: Awake/alert Behavior During Therapy: WFL for tasks assessed/performed Overall Cognitive Status: Impaired/Different from baseline Area of Impairment: Safety/judgement         Safety/Judgement: Decreased awareness of safety         General Comments   pt pleasant and cooperative, very talkative                 Home Living Family/patient expects to be discharged to:: Private residence Living Arrangements: Children (daughter moved in 2 weeks ago, alone  during the day)   Type of Home: House Home Access: Stairs to enter [employer] of Steps: 3 Entrance Stairs-Rails:  (Pt states he has rails but doesn't use them) Home Layout: Two level;Able to live on main level with  bedroom/bathroom     Bathroom Shower/Tub: Tub/shower unit;Walk-in shower   Bathroom Toilet: Standard     Home Equipment: Environmental consultant - 4 wheels;Shower seat - built in;Cane - single point          Prior Functioning/Environment Level of Independence: Independent with assistive device(s)  Gait / Transfers Assistance Needed: uses RW in house, cane when going out ADL's / Homemaking Assistance Needed: hired help for cleaning house every Friday, daughter cooks some   Comments: Single point cane    OT Diagnosis: Generalized weakness;Acute pain;Cognitive deficits   OT Problem List: Decreased strength;Decreased activity tolerance;Decreased knowledge of use of DME or AE;Decreased safety awareness;Pain;Impaired sensation;Impaired balance (sitting and/or standing);Decreased cognition   OT Treatment/Interventions: Self-care/ADL training;DME and/or AE instruction;Therapeutic activities;Patient/family education    OT Goals(Current goals can be found in the care plan section) Acute Rehab OT Goals Patient Stated Goal: go home, not a rest home OT Goal Formulation: With patient Time For Goal Achievement: 11/07/15 Potential to Achieve Goals: Good ADL Goals Pt Will Perform Grooming: with set-up;with supervision;standing Pt Will Perform Lower Body Bathing: with min assist;sitting/lateral leans;sit to/from stand Pt Will Perform Lower Body Dressing: with mod assist;with min assist;sitting/lateral leans;sit to/from stand Pt Will Transfer to Toilet: with supervision;ambulating;grab bars (3 in 1 over toilet) Pt Will Perform Toileting - Clothing Manipulation and hygiene: with min guard assist;sit to/from stand  OT Frequency: Min 2X/week   Barriers to D/C: Decreased caregiver support                        End of Session Equipment Utilized During Treatment: Gait belt;Rolling walker;Other (comment) (3 in 1)  Activity Tolerance: Patient tolerated treatment well Patient left: in bed;with call  bell/phone within reach;with bed alarm set   Time: [id number] OT Time Calculation (min): 31 min Charges:  OT General Charges $OT Visit: 1 Procedure OT Evaluation $OT Eval Moderate Complexity: 1 Procedure OT Treatments $Self Care/Home Management : 8-22 mins $Therapeutic Activity: 8-22 mins G-Codes:    Britt Bottom 10/31/2015, 12:40 PM

## 2015-10-31 NOTE — Progress Notes (Addendum)
[facility] PROGRESS NOTE  Mark [name] [id number] DOB: February 22, 1941 DOA: 10/28/2015   PCP: Rochel Brome, MD     Assessment/Plan: Principal Problem:   Gangrene of toe ([place]) Active Problems:   Type II diabetes mellitus, uncontrolled ([place])   Renal transplant recipient   Diabetic ulcer of left great toe ([place])   Cellulitis of great toe of left foot   Mark [name] is a 75 y.o. 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  Patient presents to ED with c/o 'his toe is dead'.  This onset about 1 month ago.  Finally saw doctor today after he fell getting out of the shower this morning.  His doctor was concerned about infection and sent him to the ED.  No fever no vomiting.  Toe found to be grossly infected and necrotic  . Patient followed by [name], DPM , last seen on 10/02/15. Now vascular surgery/orthopedics consulted. 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 continue  Diabetic ulcer pathway. orthopedics and vascular surgery have been consulted. ABI shows no blood flow to the left toe,  Requested Dr. Sharol Given to consult, he recommends vascular surgery consult, requested Dr. Trula Slade to evaluate ,  according to him The patient will need an arteriogram to evaluate blood flow to his left foot. Situation complicated by renal transplant with elevated creatinine, currently on gentle IV fluids to prevent renal failure Waiting for INR to trend down. angiogram likely on Monday or Tuesday  Chronic kidney disease stage IV, status post renal transplant , baseline creatinine around 1.7 creatinine has been trending up over the past year, but improved since admission, continue gentle IV hydration,  follow creatinine closely. Nephrology consulted to monitor patient's renal function in the setting of upcoming procedure/angiogram.Continue anti-rejection meds, appreciate [name] following along during this hospitalization, Prograf level is pending   A fib - on coumadin, rate controlled , preop 2-D echo pending, EKG/telemetry shows A. fib rate controlled , cont digoxin, levels subtherapeutic, metoprolol, holding coumadin, heparin when INR <2.0 . Cardiology consulted for preop clearance   DM2 -  continue Lantus to 36 units, add pre-meal NovoLog 5 units. Moderate dose mealtime and SSI AC/HS Hemoglobin A1c 8.2    Hypothyroidism-continue Synthroid  Hypertension-continue metoprolol, blood pressure uncontrolled, added prn hydralazine  Dyslipidemia-continue Crestor    DVT prophylaxis: holding Coumadin, convert to heparin gtt when subtheraputic  Code Status:  Full code     Family Communication: Discussed in detail with the patient, all imaging results, lab results explained to the patient   Disposition Plan:   needs angiogram/revascularization/toe amputation next week     Consultants: Nephrology Podiatry Vascular Cardiology  Procedures: None  Antibiotics: Anti-infectives    Start     Dose/Rate Route Frequency Ordered Stop   10/29/15 2200  vancomycin (VANCOCIN) IVPB 1000 mg/200 mL premix     1,000 mg 200 mL/hr over 60 Minutes Intravenous Every 24 hours 10/28/15 2154     10/28/15 2200  piperacillin-tazobactam (ZOSYN) IVPB 3.375 g     3.375 g 12.5 mL/hr over 240 Minutes Intravenous Every 8 hours 10/28/15 2120  HPI/Subjective:  patient in good spirits, concerned about delay in care    Objective: Vitals:   10/31/15 0450 10/31/15 0528 10/31/15 0723 10/31/15 0808  BP: (!) 198/95 (!) 170/100 (!) 171/64 (!) 212/96  Pulse: 60  (!) 56 80  Resp: 18   18  Temp: 97.5 F (36.4 C)   97.9 F (36.6 C)  TempSrc: Oral   Oral  SpO2: 99%   100%  Weight:       Height:        Intake/Output Summary (Last 24 hours) at 10/31/15 0912 Last data filed at 10/31/15 0557  Gross per 24 hour  Intake              480 ml  Output                0 ml  Net              480 ml    Exam:  Examination:  General exam: Appears calm and comfortable  Respiratory system: Clear to auscultation. Respiratory effort normal. Cardiovascular system: S1 & S2 heard, RRR. No JVD, murmurs, rubs, gallops or clicks. No pedal edema. Gastrointestinal system: Abdomen is nondistended, soft and nontender. No organomegaly or masses felt. Normal bowel sounds heard. Central nervous system: Alert and oriented. No focal neurological deficits. Extremities:  Left leg is more swollen than right, dry gangrene of the left toe Skin: No rashes, lesions or ulcers Psychiatry: Judgement and insight appear normal. Mood & affect appropriate.     Data Reviewed: I have personally reviewed following labs and imaging studies  Micro Results Recent Results (from the past 240 hour(s))  Blood Cultures x 2 sites     Status: None (Preliminary result)   Collection Time: 10/28/15 10:00 PM  Result Value Ref Range Status   Specimen Description BLOOD RIGHT FOREARM  Final   Special Requests BOTTLES DRAWN AEROBIC AND ANAEROBIC 4CC  Final   Culture NO GROWTH 2 DAYS  Final   Report Status PENDING  Incomplete  Blood Cultures x 2 sites     Status: None (Preliminary result)   Collection Time: 10/28/15 11:04 PM  Result Value Ref Range Status   Specimen Description BLOOD LEFT ARM  Final   Special Requests AEROBIC BOTTLE ONLY 7ML  Final   Culture NO GROWTH 2 DAYS  Final   Report Status PENDING  Incomplete    Radiology Reports Dg Foot Complete Left  Result Date: 10/28/2015 CLINICAL DATA:  Left big toe pain. EXAM: LEFT FOOT - COMPLETE 3+ VIEW COMPARISON:  None. FINDINGS: There is no evidence of fracture or dislocation. No evidence of dystrophic bony changes. There is soft tissue swelling of the distal first  digit. Punctate lucencies within the area of soft tissue swelling may represent entrapped fat or potentially foci of gas. Heavy vascular calcifications are seen. IMPRESSION: No evidence of fracture of the left foot. Soft tissue swelling of the distal first digit with punctate lucencies within the area of swelling, which may represent entrapped fat or potentially foci of gas. Please correlate clinically regarding the possibility of cellulitis. Electronically Signed   By: Fidela Salisbury M.D.   On: 10/28/2015 21:01    CBC  Recent Labs Lab 10/28/15 1919 10/29/15 0631 10/30/15 0358 10/31/15 0430  WBC 14.3* 9.5 9.6 8.5  HGB 15.0 13.3 13.4 13.2  HCT 44.9 40.4 41.7 39.9  PLT 327 268 282 275  MCV 93.2 92.9 93.9 93.0  MCH 31.1 30.6 30.2 30.8  MCHC 33.4  32.9 32.1 33.1  RDW 14.3 14.1 14.1 14.2  LYMPHSABS 1.9  --   --   --   MONOABS 0.9  --   --   --   EOSABS 0.0  --   --   --   BASOSABS 0.0  --   --   --     Chemistries   Recent Labs Lab 10/28/15 1919 10/29/15 0631 10/29/15 1233 10/30/15 0358 10/31/15 0430  NA 134* 135  --  137  138 140  K 4.1 3.7  --  4.2  4.2 4.2  CL 96* 101  --  104  102 104  CO2 27 26  --  27  27 30   GLUCOSE 230* 188*  --  225*  218* 147*  BUN 34* 34*  --  31*  30* 26*  CREATININE 2.60* 2.30*  --  2.14*  1.99* 1.85*  CALCIUM 8.7* 7.9*  --  8.4*  8.5* 8.7*  MG  --   --  1.9  --   --   AST 32  --   --  22 19  ALT 15*  --   --  13* 12*  ALKPHOS 60  --   --  53 50  BILITOT 1.7*  --   --  1.2 1.3*   ------------------------------------------------------------------------------------------------------------------ estimated creatinine clearance is 40.2 mL/min (by C-G formula based on SCr of 1.85 mg/dL). ------------------------------------------------------------------------------------------------------------------  Recent Labs  10/28/15 2305 10/29/15 1207  HGBA1C 8.1* 8.2*    ------------------------------------------------------------------------------------------------------------------ No results for input(s): CHOL, HDL, LDLCALC, TRIG, CHOLHDL, LDLDIRECT in the last 72 hours. ------------------------------------------------------------------------------------------------------------------ No results for input(s): TSH, T4TOTAL, T3FREE, THYROIDAB in the last 72 hours.  Invalid input(s): FREET3 ------------------------------------------------------------------------------------------------------------------  Recent Labs  10/29/15 1233  TIBC 189*  IRON 28*    Coagulation profile  Recent Labs Lab 10/28/15 2305 10/29/15 0828 10/30/15 0358 10/31/15 0430  INR 2.68* 2.75* 2.56 2.18    No results for input(s): DDIMER in the last 72 hours.  Cardiac Enzymes No results for input(s): CKMB, TROPONINI, MYOGLOBIN in the last 168 hours.  Invalid input(s): CK ------------------------------------------------------------------------------------------------------------------ Invalid input(s): POCBNP   CBG:  Recent Labs Lab 10/30/15 0751 10/30/15 1158 10/30/15 1638 10/30/15 2001 10/31/15 0806  GLUCAP 179* 176* 169* 229* 116*       Studies: No results found.    Lab Results  Component Value Date   HGBA1C 8.2 (H) 10/29/2015   HGBA1C 8.1 (H) 10/28/2015   HGBA1C 8.8 06/06/2015   Lab Results  Component Value Date   MICROALBUR 88.1 (H) 06/06/2015   LDLCALC 88 09/27/2013   CREATININE 1.85 (H) 10/31/2015       Scheduled Meds: . allopurinol  150 mg Oral Daily  . calcitRIOL  0.5 mcg Oral Daily  . digoxin  0.125 mg Oral Daily  . famotidine  20 mg Oral QHS  . feeding supplement (GLUCERNA SHAKE)  237 mL Oral Q1500  . feeding supplement (PRO-STAT SUGAR FREE 64)  30 mL Oral BID BM  . insulin aspart  0-15 Units Subcutaneous TID WC  . insulin aspart  5 Units Subcutaneous TID WC  . insulin glargine  36 Units Subcutaneous QHS  . levothyroxine   137 mcg Oral QAC breakfast  . magnesium oxide  400 mg Oral BID  . metoprolol succinate  100 mg Oral Daily  . phosphorus  250 mg Oral BID  . piperacillin-tazobactam (ZOSYN)  IV  3.375 g Intravenous Q8H  . predniSONE  5 mg Oral Daily  . rosuvastatin  10 mg Oral QPM  . tacrolimus  1 mg Oral BID  . vancomycin  1,000 mg Intravenous Q24H   Continuous Infusions: . sodium chloride       LOS: 3 days    Time spent: >30 MINS    [facility]  [facility] Pager [phone]. If 7PM-7AM, please contact night-coverage at [email or web address], password [facility] 10/31/2015, 9:12 AM  LOS: 3 days

## 2015-11-01 LAB — COMPREHENSIVE METABOLIC PANEL
ALK PHOS: 58 U/L (ref 38–126)
ALT: 12 U/L — ABNORMAL LOW (ref 17–63)
ANION GAP: 10 (ref 5–15)
AST: 21 U/L (ref 15–41)
Albumin: 2.1 g/dL — ABNORMAL LOW (ref 3.5–5.0)
BILIRUBIN TOTAL: 1.3 mg/dL — AB (ref 0.3–1.2)
BUN: 23 mg/dL — ABNORMAL HIGH (ref 6–20)
CALCIUM: 8.9 mg/dL (ref 8.9–10.3)
CO2: 24 mmol/L (ref 22–32)
Chloride: 106 mmol/L (ref 101–111)
Creatinine, Ser: 1.72 mg/dL — ABNORMAL HIGH (ref 0.61–1.24)
GFR, EST AFRICAN AMERICAN: 43 mL/min — AB (ref >60)
GFR, EST NON AFRICAN AMERICAN: 37 mL/min — AB (ref >60)
GLUCOSE: 134 mg/dL — AB (ref 65–99)
POTASSIUM: 3.7 mmol/L (ref 3.5–5.1)
Sodium: 140 mmol/L (ref 135–145)
TOTAL PROTEIN: 5.6 g/dL — AB (ref 6.5–8.1)

## 2015-11-01 LAB — CBC
HEMATOCRIT: 41.9 % (ref 39.0–52.0)
HEMOGLOBIN: 14.1 g/dL (ref 13.0–17.0)
MCH: 30.9 pg (ref 26.0–34.0)
MCHC: 33.7 g/dL (ref 30.0–36.0)
MCV: 91.9 fL (ref 78.0–100.0)
Platelets: 299 10*3/uL (ref 150–400)
RBC: 4.56 MIL/uL (ref 4.22–5.81)
RDW: 14.3 % (ref 11.5–15.5)
WBC: 11.6 10*3/uL — ABNORMAL HIGH (ref 4.0–10.5)

## 2015-11-01 LAB — GLUCOSE, CAPILLARY
GLUCOSE-CAPILLARY: 116 mg/dL — AB (ref 65–99)
GLUCOSE-CAPILLARY: 190 mg/dL — AB (ref 65–99)
GLUCOSE-CAPILLARY: 158 mg/dL — AB (ref 65–99)
GLUCOSE-CAPILLARY: 180 mg/dL — AB (ref 65–99)

## 2015-11-01 LAB — PROTIME-INR
INR: 1.97
Prothrombin Time: 22.7 seconds — ABNORMAL HIGH (ref 11.4–15.2)

## 2015-11-01 LAB — HEPARIN LEVEL (UNFRACTIONATED): Heparin Unfractionated: 0.22 IU/mL — ABNORMAL LOW (ref 0.30–0.70)

## 2015-11-01 MED ORDER — AMLODIPINE BESYLATE 5 MG PO TABS
5.0000 mg | ORAL_TABLET | Freq: Every day | ORAL | Status: DC
  Administered 2015-11-01 – 2015-11-02 (×2): 5 mg via ORAL
  Filled 2015-11-01 (×2): qty 1

## 2015-11-01 MED ORDER — HYDRALAZINE HCL 20 MG/ML IJ SOLN
10.0000 mg | INTRAMUSCULAR | Status: DC | PRN
  Administered 2015-11-02 – 2015-11-20 (×6): 10 mg via INTRAVENOUS
  Filled 2015-11-01 (×6): qty 1

## 2015-11-01 MED ORDER — HEPARIN (PORCINE) IN NACL 100-0.45 UNIT/ML-% IJ SOLN
1500.0000 [IU]/h | INTRAMUSCULAR | Status: DC
  Administered 2015-11-01: 1400 [IU]/h via INTRAVENOUS
  Administered 2015-11-02 – 2015-11-04 (×4): 1600 [IU]/h via INTRAVENOUS
  Administered 2015-11-04 – 2015-11-05 (×2): 1500 [IU]/h via INTRAVENOUS
  Filled 2015-11-01 (×7): qty 250

## 2015-11-01 MED ORDER — HYDRALAZINE HCL 20 MG/ML IJ SOLN
5.0000 mg | INTRAMUSCULAR | Status: AC
Start: 2015-11-01 — End: 2015-11-01
  Administered 2015-11-01: 5 mg via INTRAVENOUS
  Filled 2015-11-01: qty 1

## 2015-11-01 NOTE — Progress Notes (Signed)
ANTICOAGULATION CONSULT NOTE - Follow Up Consult  Pharmacy Consult for heparin Indication: atrial fibrillation  Labs:  Recent Labs  10/29/15 1233  10/30/15 0358 10/31/15 0430 10/31/15 1644 11/01/15 0628  HGB  --   < > 13.4 13.2  --  14.1  HCT  --   --  41.7 39.9  --  41.9  PLT  --   --  282 275  --  299  APTT 42*  --   --   --   --   --   LABPROT  --   --  27.2* 24.6* 24.3* 22.7*  INR  --   --  2.56 2.18 2.14 1.97  CREATININE  --   --  2.14*  1.99* 1.85*  --  1.72*  < > = values in this interval not displayed.   Assessment: 75yo male on Coumadin PTA for Afib, now INR <2, to begin heparin.  Goal of Therapy:  Heparin level 0.3-0.7 units/ml Monitor platelets by anticoagulation protocol: Yes   Plan:  Will begin heparin gtt at 1400 units/hr and monitor heparin levels and CBC.  Wynona Neat, PharmD, BCPS  11/01/2015,7:59 AM

## 2015-11-01 NOTE — Progress Notes (Signed)
ANTICOAGULATION CONSULT NOTE - Follow Up Consult  Pharmacy Consult for heparin Indication: atrial fibrillation  Labs:  Recent Labs  10/30/15 0358 10/31/15 0430 10/31/15 1644 11/01/15 0628 11/01/15 1536  HGB 13.4 13.2  --  14.1  --   HCT 41.7 39.9  --  41.9  --   PLT 282 275  --  299  --   LABPROT 27.2* 24.6* 24.3* 22.7*  --   INR 2.56 2.18 2.14 1.97  --   HEPARINUNFRC  --   --   --   --  0.22*  CREATININE 2.14*  1.99* 1.85*  --  1.72*  --      Assessment: 75yo male on Coumadin PTA for Afib, now INR <2, to begin heparin.  Initial HL is subtherapeutic at 0.22 on heparin 1400 units/hr. Nurse reports no issues with infusion or bleeding.  Goal of Therapy:  Heparin level 0.3-0.7 units/ml Monitor platelets by anticoagulation protocol: Yes   Plan:  Increase heparin to 1600 units/hr 8h HL Daily HL/CBC   Andrey Cota. Diona Foley, PharmD, BCPS Clinical Pharmacist Pager [phone] 11/01/2015,4:52 PM

## 2015-11-01 NOTE — Progress Notes (Signed)
Physical Therapy Treatment Patient Details Name: Mark [name] MRN: [id number] DOB: 01-Nov-1940 Today's Date: 11/01/2015    History of Present Illness Mark Vielmas Hackettis a 75 y.o.malewith medical history significant of HTN, DM2, ESRD s/p renal transplant 12 years ago , A.Fib on coumadin. Pt admitted with gangrene of L toe.    PT Comments    Patient progressing well towards PT goals. Tolerated gait training and stair training with Min A- guard assist due to safety concerns. Pt demonstrates decreased endurance and fatigue. Encouraged ambulation over the weekend and pt wanting to return home but awaiting possible toe amputation. Will continue to follow to maximize strength and mobility.   Follow Up Recommendations  SNF;Supervision - Intermittent     Equipment Recommendations  Rolling walker with 5" wheels    Recommendations for Other Services       Precautions / Restrictions Precautions Precautions: Fall Precaution Comments: History of peripheral neuropathy. Pt states he can't really feel his feet. Restrictions Weight Bearing Restrictions: No    Mobility  Bed Mobility Overal bed mobility: Needs Assistance Bed Mobility: Supine to Sit     Supine to sit: Modified independent (Device/Increase time);HOB elevated     General bed mobility comments: Increased time and use of rail to get to EOB.  Transfers Overall transfer level: Needs assistance Equipment used: Rolling walker (2 wheeled) Transfers: Sit to/from Stand Sit to Stand: Min guard;Min assist         General transfer comment: Min guard for safety. Stood from Automotive engineer. Min A to stand when fatigued or when not having arm rest to push off from.  Ambulation/Gait Ambulation/Gait assistance: Min guard Ambulation Distance (Feet): 150 Feet (+ 150') Assistive device: Rolling walker (2 wheeled) Gait Pattern/deviations: Step-through pattern;Decreased stride length;Trunk flexed;Shuffle Gait velocity: decreased Gait  velocity interpretation: Below normal speed for age/gender General Gait Details: Slow, mildly unsteady gait. DOE. Seated rest break prior to stair training.    Stairs Stairs: Yes Stairs assistance: Min guard Stair Management: Step to pattern;One rail Left Number of Stairs: 2 General stair comments: Cues for technique and safety.   Wheelchair Mobility    Modified Rankin (Stroke Patients Only)       Balance Overall balance assessment: Needs assistance Sitting-balance support: Feet supported;No upper extremity supported Sitting balance-Leahy Scale: Good     Standing balance support: During functional activity Standing balance-Leahy Scale: Fair                      Cognition Arousal/Alertness: Awake/alert Behavior During Therapy: WFL for tasks assessed/performed Overall Cognitive Status: Impaired/Different from baseline Area of Impairment: Safety/judgement         Safety/Judgement: Decreased awareness of safety          Exercises      General Comments        Pertinent Vitals/Pain Pain Assessment: No/denies pain Faces Pain Scale: No hurt    Home Living                      Prior Function            PT Goals (current goals can now be found in the care plan section) Progress towards PT goals: Progressing toward goals    Frequency  Min 2X/week    PT Plan Current plan remains appropriate    Co-evaluation             End of Session Equipment Utilized During Treatment: Gait belt Activity Tolerance: Patient  tolerated treatment well;Patient limited by fatigue Patient left: in chair;with call bell/phone within reach;with nursing/sitter in room     Time: [id number] PT Time Calculation (min) (ACUTE ONLY): 30 min  Charges:  $Gait Training: 23-37 mins                    G Codes:      Vonzell Lindblad A Areeba Sulser 11/01/2015, 10:29 AM Wray Kearns, PT, DPT [phone]

## 2015-11-01 NOTE — Progress Notes (Signed)
Patient ID: Mark [name], male   DOB: Mar 21, 1941, 75 y.o.   MRN: [id number]                                                                PROGRESS NOTE                                                                                                                                                                                                             Patient Demographics:    Mark [name], is a 75 y.o. male, DOB - 09-27-1940, [id number]  Admit date - 10/28/2015   Admitting Physician Mark Quill, DO  Outpatient Primary MD for the patient is Mark Brome, MD  LOS - 4  Outpatient Specialists:   Chief Complaint  Patient presents with  . 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 Patient presents to ED with c/o 'his toe is dead'. This onset about 1 month ago. Finally saw doctor today after he fell getting out of the shower this morning. His doctor was concerned about infection and sent him to the ED. No fever no vomiting.  Toe found to be grossly infected and necrotic  . Patient followed by Mark [name], DPM     Subjective:    Mark [name] today feeling better.  Stable.   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 continue Diabetic ulcer pathway. orthopedics and vascular surgery have been consulted. ABI shows no blood flow to the left toe,   Appreciate vascular input,  Mark [name] recommended:  The patient will need an arteriogram to evaluate blood flow to his left foot.Situation complicated by renal transplant  with elevated creatinine, cont on gentle IV fluids to prevent renal failure, Waiting for INR to trend down. angiogram likely on Monday or Tuesday  Chronic kidney disease stage IV, status post renal transplant , baseline creatinine around 1.7 creatinine has been trending up over the past year, but improved since admission, continue gentle IV hydration, follow creatinine closely. Nephrology consulted to monitor patient's renal function in the setting of upcoming procedure/angiogram.Continue anti-rejection meds, appreciate Mark [name] following along during this hospitalization, Prograf level was low and Mark [name] increased prograf, appreciate his input   A fib - Holding coumadin, heparin per pharmacy,  rate controlled , preop 2-D echo pending, EKG/telemetry shows A. fib rate controlled , cont digoxin, metoprolol,  Cardiology consulted for preop clearance per Mark [name] last note, not seen today will request consult in am   DM2 - continue Lantus to 36 units, cont pre-meal NovoLog 5 units.Moderate dose mealtime and SSI AC/HS Hemoglobin A1c 8.2  Hypertension:  uncontrolled Add norvasc 5mg  po qday for bp control  Hypothyroidism-continue Synthroid   Dyslipidemia-continue Crestor     Code Status : FULL CODE  Family Communication  :   Disposition Plan  : rehab  Barriers For Discharge :   Consults  :  Vascular, orthopedics, nephrology, cardiology  Procedures  :   DVT Prophylaxis  :  Heparin - SCDs   Lab Results  Component Value Date   PLT 299 11/01/2015    Antibiotics  :  Vanco, Zosyn  Anti-infectives    Start     Dose/Rate Route Frequency Ordered Stop   10/29/15 2200  vancomycin (VANCOCIN) IVPB 1000 mg/200 mL premix     1,000 mg 200 mL/hr over 60 Minutes Intravenous Every 24 hours 10/28/15 2154     10/28/15 2200   piperacillin-tazobactam (ZOSYN) IVPB 3.375 g     3.375 g 12.5 mL/hr over 240 Minutes Intravenous Every 8 hours 10/28/15 2120     10/28/15 2200  vancomycin (VANCOCIN) 2,500 mg in sodium chloride 0.9 % 500 mL IVPB     2,500 mg 250 mL/hr over 120 Minutes Intravenous  Once 10/28/15 2154 10/29/15 2330        Objective:   Vitals:   11/01/15 0653 11/01/15 0812 11/01/15 1019 11/01/15 1148  BP: (!) 198/67 (!) 170/72  (!) 145/73  Pulse:  79 71 (!) 57  Resp:  18    Temp:  98.4 F (36.9 C)    TempSrc:  Oral    SpO2:  99%    Weight:      Height:        Wt Readings from Last 3 Encounters:  10/31/15 98.5 kg (217 lb 2.5 oz)  09/05/15 103.1 kg (227 lb 3.2 oz)  08/05/15 101.8 kg (224 lb 6.4 oz)     Intake/Output Summary (Last 24 hours) at 11/01/15 1421 Last data filed at 11/01/15 1408  Gross per 24 hour  Intake             1170 ml  Output              250 ml  Net              920 ml     Physical Exam  Awake Alert, Oriented X 3, No new F.N deficits, Normal affect [name].AT,PERRAL Supple Neck,No JVD, No cervical lymphadenopathy appriciated.  Symmetrical Chest wall movement, Good air movement bilaterally, CTAB RRR,No Gallops,Rubs or new Murmurs, No Parasternal Heave +ve B.Sounds, Abd Soft, No tenderness, No organomegaly appriciated,  No rebound - guarding or rigidity. No Cyanosis, Clubbing or edema,  Black 1st toe     Data Review:    CBC  Recent Labs Lab 10/28/15 1919 10/29/15 0631 10/30/15 0358 10/31/15 0430 11/01/15 0628  WBC 14.3* 9.5 9.6 8.5 11.6*  HGB 15.0 13.3 13.4 13.2 14.1  HCT 44.9 40.4 41.7 39.9 41.9  PLT 327 268 282 275 299  MCV 93.2 92.9 93.9 93.0 91.9  MCH 31.1 30.6 30.2 30.8 30.9  MCHC 33.4 32.9 32.1 33.1 33.7  RDW 14.3 14.1 14.1 14.2 14.3  LYMPHSABS 1.9  --   --   --   --   MONOABS 0.9  --   --   --   --   EOSABS 0.0  --   --   --   --   BASOSABS 0.0  --   --   --   --     Chemistries   Recent Labs Lab 10/28/15 1919 10/29/15 0631 10/29/15 1233  10/30/15 0358 10/31/15 0430 11/01/15 0628  NA 134* 135  --  137  138 140 140  K 4.1 3.7  --  4.2  4.2 4.2 3.7  CL 96* 101  --  104  102 104 106  CO2 27 26  --  27  27 30 24   GLUCOSE 230* 188*  --  225*  218* 147* 134*  BUN 34* 34*  --  31*  30* 26* 23*  CREATININE 2.60* 2.30*  --  2.14*  1.99* 1.85* 1.72*  CALCIUM 8.7* 7.9*  --  8.4*  8.5* 8.7* 8.9  MG  --   --  1.9  --   --   --   AST 32  --   --  22 19 21   ALT 15*  --   --  13* 12* 12*  ALKPHOS 60  --   --  53 50 58  BILITOT 1.7*  --   --  1.2 1.3* 1.3*   ------------------------------------------------------------------------------------------------------------------ No results for input(s): CHOL, HDL, LDLCALC, TRIG, CHOLHDL, LDLDIRECT in the last 72 hours.  Lab Results  Component Value Date   HGBA1C 8.2 (H) 10/29/2015   ------------------------------------------------------------------------------------------------------------------ No results for input(s): TSH, T4TOTAL, T3FREE, THYROIDAB in the last 72 hours.  Invalid input(s): FREET3 ------------------------------------------------------------------------------------------------------------------ No results for input(s): VITAMINB12, FOLATE, FERRITIN, TIBC, IRON, RETICCTPCT in the last 72 hours.  Coagulation profile  Recent Labs Lab 10/29/15 0828 10/30/15 0358 10/31/15 0430 10/31/15 1644 11/01/15 0628  INR 2.75* 2.56 2.18 2.14 1.97    No results for input(s): DDIMER in the last 72 hours.  Cardiac Enzymes No results for input(s): CKMB, TROPONINI, MYOGLOBIN in the last 168 hours.  Invalid input(s): CK ------------------------------------------------------------------------------------------------------------------ No results found for: BNP  Inpatient Medications  Scheduled Meds: . allopurinol  150 mg Oral Daily  . amLODipine  5 mg Oral Daily  . calcitRIOL  0.5 mcg Oral Daily  . digoxin  0.125 mg Oral Daily  . famotidine  20 mg Oral QHS  .  feeding supplement (GLUCERNA SHAKE)  237 mL Oral Q1500  . feeding supplement (PRO-STAT SUGAR FREE 64)  30 mL Oral BID BM  . insulin aspart  0-15 Units Subcutaneous TID WC  . insulin aspart  5 Units Subcutaneous TID WC  . insulin glargine  36 Units Subcutaneous QHS  . levothyroxine  137 mcg Oral QAC breakfast  . magnesium oxide  400 mg Oral BID  . metoprolol succinate  100 mg Oral Daily  . phosphorus  250  mg Oral BID  . piperacillin-tazobactam (ZOSYN)  IV  3.375 g Intravenous Q8H  . predniSONE  5 mg Oral Daily  . rosuvastatin  10 mg Oral QPM  . tacrolimus  1 mg Oral BID  . vancomycin  1,000 mg Intravenous Q24H   Continuous Infusions: . sodium chloride 75 mL/hr at 11/01/15 1135  . heparin 1,400 Units/hr (11/01/15 1120)   PRN Meds:.acetaminophen, hydrALAZINE  Micro Results Recent Results (from the past 240 hour(s))  Blood Cultures x 2 sites     Status: None (Preliminary result)   Collection Time: 10/28/15 10:00 PM  Result Value Ref Range Status   Specimen Description BLOOD RIGHT FOREARM  Final   Special Requests BOTTLES DRAWN AEROBIC AND ANAEROBIC 4CC  Final   Culture NO GROWTH 3 DAYS  Final   Report Status PENDING  Incomplete  Blood Cultures x 2 sites     Status: None (Preliminary result)   Collection Time: 10/28/15 11:04 PM  Result Value Ref Range Status   Specimen Description BLOOD LEFT ARM  Final   Special Requests AEROBIC BOTTLE ONLY 7ML  Final   Culture NO GROWTH 3 DAYS  Final   Report Status PENDING  Incomplete    Radiology Reports Dg Foot Complete Left  Result Date: 10/28/2015 CLINICAL DATA:  Left big toe pain. EXAM: LEFT FOOT - COMPLETE 3+ VIEW COMPARISON:  None. FINDINGS: There is no evidence of fracture or dislocation. No evidence of dystrophic bony changes. There is soft tissue swelling of the distal first digit. Punctate lucencies within the area of soft tissue swelling may represent entrapped fat or potentially foci of gas. Heavy vascular calcifications are seen.  IMPRESSION: No evidence of fracture of the left foot. Soft tissue swelling of the distal first digit with punctate lucencies within the area of swelling, which may represent entrapped fat or potentially foci of gas. Please correlate clinically regarding the possibility of cellulitis. Electronically Signed   By: Fidela Salisbury M.D.   On: 10/28/2015 21:01   Time Spent in minutes  30   Jani Gravel M.D on 11/01/2015 at 2:21 PM  Between 7am to 7pm - Pager - [phone]  After 7pm go to [email or web address] - password [facility]  [facility] -  Office  [phone]

## 2015-11-01 NOTE — Plan of Care (Signed)
Problem: Skin Integrity: Goal: Skin integrity will improve Outcome: Progressing Monitoring L great toe. Antibiotics administered IV.

## 2015-11-01 NOTE — Care Management Important Message (Signed)
Important Message  Patient Details  Name: Mark [name] MRN: [id number] Date of Birth: 1941/01/18   Medicare Important Message Given:  Yes    Loann Quill 11/01/2015, 10:54 AM

## 2015-11-01 NOTE — Progress Notes (Signed)
  [facility] Progress Note  Subjective   No complaints. Says that left great toe looks better.   Objective Vitals:   11/01/15 0812 11/01/15 1019  BP: (!) 170/72   Pulse: 79 71  Resp: 18   Temp: 98.4 F (36.9 C)     Intake/Output Summary (Last 24 hours) at 11/01/15 1040 Last data filed at 11/01/15 0827  Gross per 24 hour  Intake             1230 ml  Output              250 ml  Net              980 ml   Left great toe dry gangrene. Cellulitis left foot.   Assessment/Planning: 75 y.o. male with gangrenous left great toe.   Continue abx for left great toe. Toe appears stable. There were concerns that the toe looked worse yesterday. WBC is slightly elevated today. Remains afebrile. Continue to monitor. If his toe worsens, may need toe amputation prior to arteriogram.  INR continuing to trend down. Creatinine also improving. Continue heparin and IVF.  Plan for arteriogram on Monday or Tuesday.   Mark [name] 11/01/2015 10:40 AM --  Laboratory CBC    Component Value Date/Time   WBC 11.6 (H) 11/01/2015 0628   HGB 14.1 11/01/2015 0628   HCT 41.9 11/01/2015 0628   PLT 299 11/01/2015 0628    BMET    Component Value Date/Time   NA 140 11/01/2015 0628   K 3.7 11/01/2015 0628   CL 106 11/01/2015 0628   CO2 24 11/01/2015 0628   GLUCOSE 134 (H) 11/01/2015 0628   BUN 23 (H) 11/01/2015 0628   CREATININE 1.72 (H) 11/01/2015 0628   CALCIUM 8.9 11/01/2015 0628   GFRNONAA 37 (L) 11/01/2015 0628   GFRAA 43 (L) 11/01/2015 0628    COAG Lab Results  Component Value Date   INR 1.97 11/01/2015   INR 2.14 10/31/2015   INR 2.18 10/31/2015   No results found for: PTT  Antibiotics Anti-infectives    Start     Dose/Rate Route Frequency Ordered Stop   10/29/15 2200  vancomycin (VANCOCIN) IVPB 1000 mg/200 mL premix     1,000 mg 200 mL/hr over 60 Minutes Intravenous Every 24 hours 10/28/15 2154     10/28/15 2200  piperacillin-tazobactam (ZOSYN) IVPB  3.375 g     3.375 g 12.5 mL/hr over 240 Minutes Intravenous Every 8 hours 10/28/15 2120     10/28/15 2200  vancomycin (VANCOCIN) 2,500 mg in sodium chloride 0.9 % 500 mL IVPB     2,500 mg 250 mL/hr over 120 Minutes Intravenous  Once 10/28/15 2154 10/29/15 [place], PA-C [facility] Office: [phone] Pager: [phone] 11/01/2015 10:40 AM

## 2015-11-02 DIAGNOSIS — I272 Other secondary pulmonary hypertension: Secondary | ICD-10-CM

## 2015-11-02 DIAGNOSIS — Z01810 Encounter for preprocedural cardiovascular examination: Secondary | ICD-10-CM

## 2015-11-02 DIAGNOSIS — I96 Gangrene, not elsewhere classified: Secondary | ICD-10-CM

## 2015-11-02 DIAGNOSIS — E785 Hyperlipidemia, unspecified: Secondary | ICD-10-CM

## 2015-11-02 LAB — CULTURE, BLOOD (ROUTINE X 2)
CULTURE: NO GROWTH
Culture: NO GROWTH

## 2015-11-02 LAB — COMPREHENSIVE METABOLIC PANEL
ALK PHOS: 57 U/L (ref 38–126)
ALT: 14 U/L — ABNORMAL LOW (ref 17–63)
ANION GAP: 6 (ref 5–15)
AST: 19 U/L (ref 15–41)
Albumin: 1.9 g/dL — ABNORMAL LOW (ref 3.5–5.0)
BILIRUBIN TOTAL: 0.9 mg/dL (ref 0.3–1.2)
BUN: 29 mg/dL — AB (ref 6–20)
CALCIUM: 8.4 mg/dL — AB (ref 8.9–10.3)
CO2: 25 mmol/L (ref 22–32)
CREATININE: 1.89 mg/dL — AB (ref 0.61–1.24)
Chloride: 105 mmol/L (ref 101–111)
GFR calc non Af Amer: 33 mL/min — ABNORMAL LOW (ref >60)
GFR, EST AFRICAN AMERICAN: 38 mL/min — AB (ref >60)
GLUCOSE: 119 mg/dL — AB (ref 65–99)
Potassium: 3.5 mmol/L (ref 3.5–5.1)
SODIUM: 136 mmol/L (ref 135–145)
Total Protein: 5.4 g/dL — ABNORMAL LOW (ref 6.5–8.1)

## 2015-11-02 LAB — PROTIME-INR
INR: 1.9
Prothrombin Time: 22 seconds — ABNORMAL HIGH (ref 11.4–15.2)

## 2015-11-02 LAB — HEPARIN LEVEL (UNFRACTIONATED)
HEPARIN UNFRACTIONATED: 0.45 [IU]/mL (ref 0.30–0.70)
Heparin Unfractionated: 0.44 IU/mL (ref 0.30–0.70)

## 2015-11-02 LAB — CBC
HCT: 40.7 % (ref 39.0–52.0)
Hemoglobin: 13.5 g/dL (ref 13.0–17.0)
MCH: 30.9 pg (ref 26.0–34.0)
MCHC: 33.2 g/dL (ref 30.0–36.0)
MCV: 93.1 fL (ref 78.0–100.0)
PLATELETS: 287 10*3/uL (ref 150–400)
RBC: 4.37 MIL/uL (ref 4.22–5.81)
RDW: 14.3 % (ref 11.5–15.5)
WBC: 9.6 10*3/uL (ref 4.0–10.5)

## 2015-11-02 LAB — GLUCOSE, CAPILLARY
GLUCOSE-CAPILLARY: 83 mg/dL (ref 65–99)
GLUCOSE-CAPILLARY: 115 mg/dL — AB (ref 65–99)
Glucose-Capillary: 136 mg/dL — ABNORMAL HIGH (ref 65–99)
Glucose-Capillary: 119 mg/dL — ABNORMAL HIGH (ref 65–99)

## 2015-11-02 LAB — MAGNESIUM: MAGNESIUM: 2.1 mg/dL (ref 1.7–2.4)

## 2015-11-02 MED ORDER — AMLODIPINE BESYLATE 10 MG PO TABS
10.0000 mg | ORAL_TABLET | Freq: Every day | ORAL | Status: DC
Start: 2015-11-03 — End: 2015-11-16
  Administered 2015-11-03 – 2015-11-15 (×12): 10 mg via ORAL
  Filled 2015-11-02 (×13): qty 1

## 2015-11-02 MED ORDER — POTASSIUM CHLORIDE CRYS ER 20 MEQ PO TBCR: 30.0000 meq | EXTENDED_RELEASE_TABLET | Freq: Two times a day (BID) | ORAL | Status: DC

## 2015-11-02 MED ORDER — AMLODIPINE BESYLATE 5 MG PO TABS
5.0000 mg | ORAL_TABLET | Freq: Once | ORAL | Status: AC
  Administered 2015-11-02: 5 mg via ORAL
  Filled 2015-11-02: qty 1

## 2015-11-02 MED ORDER — POTASSIUM CHLORIDE CRYS ER 20 MEQ PO TBCR
30.0000 meq | EXTENDED_RELEASE_TABLET | Freq: Once | ORAL | Status: AC
  Administered 2015-11-02: 30 meq via ORAL
  Filled 2015-11-02: qty 1

## 2015-11-02 MED ORDER — POTASSIUM CHLORIDE CRYS ER 20 MEQ PO TBCR
20.0000 meq | EXTENDED_RELEASE_TABLET | Freq: Two times a day (BID) | ORAL | Status: DC
  Administered 2015-11-02 – 2015-11-09 (×14): 20 meq via ORAL
  Filled 2015-11-02 (×15): qty 1

## 2015-11-02 NOTE — Progress Notes (Signed)
Patient having frequent small runs 4-11 beats NSVT returning to atrial fibrillation. Patient is asymptomatic.NP M. Lynch notified. Orders received to obtain bmp and magnesium and call abnormal results.

## 2015-11-02 NOTE — Progress Notes (Signed)
ANTICOAGULATION CONSULT NOTE - Follow Up Consult  Pharmacy Consult for heparin Indication: atrial fibrillation  Labs:  Recent Labs  10/31/15 0430 10/31/15 1644 11/01/15 0628 11/01/15 1536 11/02/15 0104 11/02/15 0257 11/02/15 0817  HGB 13.2  --  14.1  --   --  13.5  --   HCT 39.9  --  41.9  --   --  40.7  --   PLT 275  --  299  --   --  287  --   LABPROT 24.6* 24.3* 22.7*  --   --   --  22.0*  INR 2.18 2.14 1.97  --   --   --  1.90  HEPARINUNFRC  --   --   --  0.22* 0.44  --  0.45  CREATININE 1.85*  --  1.72*  --   --  1.89*  --      Assessment: 75 yo male on Coumadin PTA for Afib, now INR <2 and on heparin. HL has been therapeutic x 2 (0.44, 0.45) on 1600 units/hr. No issues per RN. CBC stable.  Goal of Therapy:  Heparin level 0.3-0.7 units/ml Monitor platelets by anticoagulation protocol: Yes   Plan:  - Continue heparin infusion at 1600 units/hr - Daily HL, CBC - Monitor s/s of bleeding  Cassie L. Nicole Kindred, PharmD Clinical Pharmacist Pager: [phone] 11/02/2015 8:48 AM

## 2015-11-02 NOTE — Progress Notes (Signed)
Patient ID: Mark [name], male   DOB: Jun 26, 1940, 75 y.o.   MRN: [id number]                                                                PROGRESS NOTE                                                                                                                                                                                                             Patient Demographics:    Gabriela Greenstone, is a 75 y.o. male, DOB - January 22, 1941, [id number]  Admit date - 10/28/2015   Admitting Physician Etta Quill, DO  Outpatient Primary MD for the patient is Rochel Brome, MD  LOS - 5  Outpatient Specialists: Myeong Roxine Caddy (podiatry)  Chief Complaint  Patient presents with  . 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 Patient presents to ED with c/o 'his toe is dead'. This onset about 1 month ago. Finally saw doctor today after he fell getting out of the shower this morning. His doctor was concerned about infection and sent him to the ED. No fever no vomiting.Toe found to begrossly infected and necrotic . Patient followed by [name], DPM    Subjective:    [name] today has been afebrile.  1st toe left foot still black.   No headache, No chest pain, No abdominal pain - No Nausea, No new weakness tingling or numbness, No Cough - SOB. RN concerned about his access in terms of IV. Nephrology doesn't want a Picc line, requests central line if having difficulty with access.    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 Starr Sinclair:  The patient will need an arteriogram to evaluate blood flow to his left foot.Situation complicated by renal transplant with elevated creatinine, cont on gentle IV fluids to prevent renal failure, Waiting for INR to trend down. angiogram likely on Monday or Tuesday RN is going to speak to vascular regarding access and possible need for IJ  Chronic kidney disease stage IV, status post renal transplant , baseline creatinine around 1.7 creatinine has been trending up over the past year, but improved since admission, continue gentle IV hydration, follow creatinine closely. Nephrology consulted to monitor patient's renal function in the setting of upcoming procedure/angiogram.Continue anti-rejection meds, appreciate [name] following along during this hospitalization, Prograf level was low and [name] increased prograf, appreciate his input   A fib- Holding coumadin, heparin per pharmacy,  rate controlled , preop 2-D echo pending, EKG/telemetry shows A. fib rate controlled, cont digoxin, metoprolol,  Cardiology consulted for preop clearance   DM2 -continueLantus to 36 units, cont pre-meal NovoLog 5 units.Moderate dose mealtime and SSI AC/HS Hemoglobin A1c 8.2  Hypertension:  uncontrolled Cont  norvasc 5mg  po qday for bp control  Hypothyroidism-continue Synthroid  Protein Calorie Malnutrition prostat  Dyslipidemia-continue Crestor     Code Status : FULL CODE  Family Communication  :   Disposition Plan  : ? Rehab vs home  Barriers For Discharge :   Consults  :  Vascular surgery, cardiology, nephrology  Procedures  :  DVT Prophylaxis  :  Lovenox - Heparin - SCDs   Lab Results  Component Value Date   PLT 287 11/02/2015     Antibiotics  :  See below  Anti-infectives    Start     Dose/Rate Route Frequency Ordered Stop   10/29/15 2200  vancomycin (VANCOCIN) IVPB 1000 mg/200 mL premix     1,000 mg 200 mL/hr over 60 Minutes Intravenous Every 24 hours 10/28/15 2154     10/28/15 2200  piperacillin-tazobactam (ZOSYN) IVPB 3.375 g     3.375 g 12.5 mL/hr over 240 Minutes Intravenous Every 8 hours 10/28/15 2120     10/28/15 2200  vancomycin (VANCOCIN) 2,500 mg in sodium chloride 0.9 % 500 mL IVPB     2,500 mg 250 mL/hr over 120 Minutes Intravenous  Once 10/28/15 2154 10/29/15 2330        Objective:   Vitals:   11/02/15 0055 11/02/15 0135 11/02/15 0413 11/02/15 0900  BP: (!) 185/80 (!) 142/94 (!) 163/48 (!) 166/62  Pulse:   (!) 58 75  Resp:   19 18  Temp:   97.6 F (36.4 C)   TempSrc:   Oral   SpO2:   98% 98%  Weight:      Height:        Wt Readings from Last 3 Encounters:  11/01/15 97.4 kg (214 lb 11.7 oz)  09/05/15 103.1 kg (227 lb 3.2 oz)  08/05/15 101.8 kg (224 lb 6.4 oz)     Intake/Output Summary (Last 24 hours) at 11/02/15 1029 Last data filed at 11/02/15 1000  Gross per 24 hour  Intake          4336.03 ml  Output              200 ml  Net          4136.03 ml     Physical Exam  Awake Alert, Oriented X 3, No new F.N deficits, Normal affect [place].AT,PERRAL Supple Neck,No JVD, No cervical lymphadenopathy appriciated.  Symmetrical Chest  wall movement, Good air movement bilaterally, CTAB RRR,No Gallops,Rubs or new Murmurs, No Parasternal Heave +ve B.Sounds, Abd Soft, No tenderness, No organomegaly appriciated, No rebound - guarding or rigidity. No Cyanosis, Clubbing or edema, No new Rash, bruise left antecub Iv access in right forearm    Data Review:    CBC  Recent Labs Lab 10/28/15 1919 10/29/15 0631 10/30/15 0358 10/31/15 0430 11/01/15 0628 11/02/15 0257  WBC 14.3* 9.5 9.6 8.5 11.6* 9.6  HGB 15.0 13.3 13.4 13.2 14.1 13.5  HCT 44.9 40.4 41.7 39.9 41.9 40.7  PLT 327 268  282 275 299 287  MCV 93.2 92.9 93.9 93.0 91.9 93.1  MCH 31.1 30.6 30.2 30.8 30.9 30.9  MCHC 33.4 32.9 32.1 33.1 33.7 33.2  RDW 14.3 14.1 14.1 14.2 14.3 14.3  LYMPHSABS 1.9  --   --   --   --   --   MONOABS 0.9  --   --   --   --   --   EOSABS 0.0  --   --   --   --   --   BASOSABS 0.0  --   --   --   --   --     Chemistries   Recent Labs Lab 10/28/15 1919 10/29/15 0631 10/29/15 1233 10/30/15 0358 10/31/15 0430 11/01/15 0628 11/02/15 0257  NA 134* 135  --  137  138 140 140 136  K 4.1 3.7  --  4.2  4.2 4.2 3.7 3.5  CL 96* 101  --  104  102 104 106 105  CO2 27 26  --  27  27 30 24 25   GLUCOSE 230* 188*  --  225*  218* 147* 134* 119*  BUN 34* 34*  --  31*  30* 26* 23* 29*  CREATININE 2.60* 2.30*  --  2.14*  1.99* 1.85* 1.72* 1.89*  CALCIUM 8.7* 7.9*  --  8.4*  8.5* 8.7* 8.9 8.4*  MG  --   --  1.9  --   --   --  2.1  AST 32  --   --  22 19 21 19   ALT 15*  --   --  13* 12* 12* 14*  ALKPHOS 60  --   --  53 50 58 57  BILITOT 1.7*  --   --  1.2 1.3* 1.3* 0.9   ------------------------------------------------------------------------------------------------------------------ No results for input(s): CHOL, HDL, LDLCALC, TRIG, CHOLHDL, LDLDIRECT in the last 72 hours.  Lab Results  Component Value Date   HGBA1C 8.2 (H) 10/29/2015   ------------------------------------------------------------------------------------------------------------------ No results for input(s): TSH, T4TOTAL, T3FREE, THYROIDAB in the last 72 hours.  Invalid input(s): FREET3 ------------------------------------------------------------------------------------------------------------------ No results for input(s): VITAMINB12, FOLATE, FERRITIN, TIBC, IRON, RETICCTPCT in the last 72 hours.  Coagulation profile  Recent Labs Lab 10/30/15 0358 10/31/15 0430 10/31/15 1644 11/01/15 0628 11/02/15 0817  INR 2.56 2.18 2.14 1.97 1.90    No results for input(s): DDIMER in the last 72 hours.  Cardiac  Enzymes No results for input(s): CKMB, TROPONINI, MYOGLOBIN in the last 168 hours.  Invalid input(s): CK ------------------------------------------------------------------------------------------------------------------ No results found for: BNP  Inpatient Medications  Scheduled Meds: . allopurinol  150 mg Oral Daily  . amLODipine  5 mg Oral Daily  . calcitRIOL  0.5 mcg Oral Daily  . digoxin  0.125 mg Oral Daily  . famotidine  20 mg Oral QHS  . feeding supplement (GLUCERNA SHAKE)  237 mL Oral Q1500  . feeding supplement (PRO-STAT SUGAR FREE 64)  30 mL Oral BID BM  . insulin aspart  0-15 Units Subcutaneous TID WC  . insulin aspart  5 Units Subcutaneous TID WC  . insulin glargine  36 Units Subcutaneous QHS  . levothyroxine  137 mcg Oral QAC breakfast  . magnesium oxide  400 mg Oral BID  . metoprolol succinate  100 mg Oral Daily  . phosphorus  250 mg Oral BID  . piperacillin-tazobactam (ZOSYN)  IV  3.375 g Intravenous Q8H  . predniSONE  5 mg Oral Daily  . rosuvastatin  10 mg Oral QPM  . tacrolimus  1 mg Oral BID  . vancomycin  1,000 mg Intravenous Q24H   Continuous Infusions: . sodium chloride 75 mL/hr at 11/02/15 0047  . heparin 1,600 Units/hr (11/02/15 0356)   PRN Meds:.acetaminophen, hydrALAZINE  Micro Results Recent Results (from the past 240 hour(s))  Blood Cultures x 2 sites     Status: None (Preliminary result)   Collection Time: 10/28/15 10:00 PM  Result Value Ref Range Status   Specimen Description BLOOD RIGHT FOREARM  Final   Special Requests BOTTLES DRAWN AEROBIC AND ANAEROBIC 4CC  Final   Culture NO GROWTH 4 DAYS  Final   Report Status PENDING  Incomplete  Blood Cultures x 2 sites     Status: None (Preliminary result)   Collection Time: 10/28/15 11:04 PM  Result Value Ref Range Status   Specimen Description BLOOD LEFT ARM  Final   Special Requests AEROBIC BOTTLE ONLY 7ML  Final   Culture NO GROWTH 4 DAYS  Final   Report Status PENDING  Incomplete     Radiology Reports Dg Foot Complete Left  Result Date: 10/28/2015 CLINICAL DATA:  Left big toe pain. EXAM: LEFT FOOT - COMPLETE 3+ VIEW COMPARISON:  None. FINDINGS: There is no evidence of fracture or dislocation. No evidence of dystrophic bony changes. There is soft tissue swelling of the distal first digit. Punctate lucencies within the area of soft tissue swelling may represent entrapped fat or potentially foci of gas. Heavy vascular calcifications are seen. IMPRESSION: No evidence of fracture of the left foot. Soft tissue swelling of the distal first digit with punctate lucencies within the area of swelling, which may represent entrapped fat or potentially foci of gas. Please correlate clinically regarding the possibility of cellulitis. Electronically Signed   By: Fidela Salisbury M.D.   On: 10/28/2015 21:01   Time Spent in minutes  30   Jani Gravel M.D on 11/02/2015 at 10:29 AM  Between 7am to 7pm - Pager - [phone]  After 7pm go to [email or web address] - password [email or web address]  [facility] -  Office  [phone]

## 2015-11-02 NOTE — Progress Notes (Signed)
ANTICOAGULATION CONSULT NOTE - Follow Up Consult  Pharmacy Consult for heparin Indication: atrial fibrillation  Labs:  Recent Labs  10/30/15 0358 10/31/15 0430 10/31/15 1644 11/01/15 0628 11/01/15 1536 11/02/15 0104  HGB 13.4 13.2  --  14.1  --   --   HCT 41.7 39.9  --  41.9  --   --   PLT 282 275  --  299  --   --   LABPROT 27.2* 24.6* 24.3* 22.7*  --   --   INR 2.56 2.18 2.14 1.97  --   --   HEPARINUNFRC  --   --   --   --  0.22* 0.44  CREATININE 2.14*  1.99* 1.85*  --  1.72*  --   --     Assessment/Plan:  75yo male therapeutic on heparin after rate change. Will continue gtt at current rate and confirm stable with additional level.   Wynona Neat, PharmD, BCPS  11/02/2015,2:36 AM

## 2015-11-02 NOTE — Consult Note (Signed)
Patient ID: Mark [name] MRN: [id number] DOB/AGE: 08/28/1940 75 y.o.  Admit date: 10/28/2015 Primary Physician Rochel Brome, MD  Primary Cardiologist unassigned Oval Linsey)   Chief Complaint  Pre-operative risk assessment   HPI: Mark [name] is a 75M with ESRD s/p renal transplant, atrial fibrillation on Coumadin, grade 2 diastolic dysfunction, and diabetes awaiting toe amputation for gangrene.  Mark [name] was admitted with gangrene of the R great toe.  He has an angiogram pending prior to surgery.  He underwent renal transplant approximately 10 years ago and now has a baseline creatinine around 1.7. He's been getting gentle IV fluids prior to the angiogram.   Mark [name] reports that other than his foot he has been feeling well. He has minimal pain that is well controlled with Tylenol. He denies any chest pain or shortness of breath. He does not exercise much but is able to ambulate one flight of stairs without chest pain or shortness of breath. He does not think he can walk for 4 blocks due to his foot. He has chronic lower extremity edema that is unchanged. He denies orthopnea or PND.  He had an echo this admission that revealed LVEF 65-70% with grade 2 diastolic dysfunction. He also has moderately elevated pulmonary pressures.   Mark [name] endorses snoring but does not think that he has apneic episodes. He feels well-rested in the mornings but endorses daytime fatigue.  Review of Systems: A 12 point review of systems was obtained and was negative with exceptions noted in the HPI.   Past Medical History:  Diagnosis Date  . Atrial fibrillation ([place])   . Diabetes ([place])   . Gout   . Hypertension   . Hypothyroidism   . Renal disorder    kidney transplant   . Ulcer of other part of foot 12/15/2013    Medications Prior to Admission  Medication Sig Dispense Refill  . acetaminophen (TYLENOL) 500 MG tablet Take 500 mg by mouth every 6 (six) hours as needed for mild pain or  moderate pain.    Marland Kitchen allopurinol (ZYLOPRIM) 300 MG tablet TAKE 1/2 OF A TABLET BY MOUTH ONCE DAILY. (Patient taking differently: Take 150 mg by mouth in the morning) 15 tablet 5  . BD PEN NEEDLE NANO U/F 32G X 4 MM MISC USE AS DIRECTED 5 TIMES DAILY SUBQUE. 200 each 1  . calcitRIOL (ROCALTROL) 0.5 MCG capsule Take one by mouth daily    . DIGOX 0.125 MG tablet Take one by mouth daily    . fenofibrate micronized (LOFIBRA) 134 MG capsule TAKE ONE CAPSULE EVERY DAY WITH A MEAL (Patient taking differently: Take 1 capsule by mouth in the morning on Mon/Wed/Fri) 90 capsule 1  . furosemide (LASIX) 80 MG tablet Take 200 mg by mouth 2 (two) times daily.     . Insulin Disposable Pump (V-GO 40) KIT Use one per day (Patient taking differently: As directed) 30 kit 3  . insulin regular (NOVOLIN R) 100 units/mL injection Inject 78 Units into the skin See admin instructions. (Daily) Via pump V-Go 40    . Insulin Syringe-Needle U-100 (INSULIN SYRINGE .3CC/31GX5/16") 31G X 5/16" 0.3 ML MISC Use as directed to inject insulin 100 each 3  . ketoconazole (NIZORAL) 2 % shampoo Apply 1 application topically 2 (two) times a week.     . levothyroxine (SYNTHROID, LEVOTHROID) 137 MCG tablet Take 137 mcg by mouth daily before breakfast.    . magnesium oxide (MAG-OX) 400 MG tablet Take 400 mg by mouth 2 (  two) times daily.    . metoprolol succinate (TOPROL-XL) 50 MG 24 hr tablet Take 50 mg by mouth daily.     . mupirocin ointment (BACTROBAN) 2 % Apply 1 application topically daily as needed.   0  . nystatin (MYCOSTATIN/NYSTOP) 100000 UNIT/GM POWD Apply topically daily as needed.     Marland Kitchen omeprazole (PRILOSEC) 20 MG capsule Take 20 mg by mouth daily.    Glory Rosebush DELICA LANCETS FINE MISC Use to check blood sugar 4 times per day dx code 250.02 200 each 3  . ONETOUCH VERIO test strip USE AS INSTRUCTED TO CHECK BLOOD SUGAR 4 TIMES A DAY 150 each 3  . PHOSPHA 250 NEUTRAL 155-852-130 MG tablet Take 250 mg by mouth 2 (two) times daily.    12  . potassium chloride SA (K-DUR,KLOR-CON) 20 MEQ tablet Take 50 mEq by mouth 2 (two) times daily.     . predniSONE (DELTASONE) 5 MG tablet Take 5 mg by mouth daily.  0  . rosuvastatin (CRESTOR) 10 MG tablet Take 10 mg by mouth every evening.    . tacrolimus (PROGRAF) 0.5 MG capsule Take 0.5 mg by mouth 2 (two) times daily.    Marland Kitchen warfarin (COUMADIN) 5 MG tablet Take 2.5-5 mg by mouth See admin instructions. 5 mg in the evening on Sun/Mon/Wed/Fri/Sat and 2.5 mg on Tues/Thurs       . allopurinol  150 mg Oral Daily  . amLODipine  5 mg Oral Daily  . calcitRIOL  0.5 mcg Oral Daily  . digoxin  0.125 mg Oral Daily  . famotidine  20 mg Oral QHS  . feeding supplement (GLUCERNA SHAKE)  237 mL Oral Q1500  . feeding supplement (PRO-STAT SUGAR FREE 64)  30 mL Oral BID BM  . insulin aspart  0-15 Units Subcutaneous TID WC  . insulin aspart  5 Units Subcutaneous TID WC  . insulin glargine  36 Units Subcutaneous QHS  . levothyroxine  137 mcg Oral QAC breakfast  . magnesium oxide  400 mg Oral BID  . metoprolol succinate  100 mg Oral Daily  . phosphorus  250 mg Oral BID  . piperacillin-tazobactam (ZOSYN)  IV  3.375 g Intravenous Q8H  . potassium chloride  20 mEq Oral BID  . predniSONE  5 mg Oral Daily  . rosuvastatin  10 mg Oral QPM  . tacrolimus  1 mg Oral BID  . vancomycin  1,000 mg Intravenous Q24H    Infusions: . sodium chloride 75 mL/hr at 11/02/15 1309  . heparin 1,600 Units/hr (11/02/15 0356)    Allergies  Allergen Reactions  . Tape Other (See Comments)    SKIN WILL TEAR!!    Social History   Social History  . Marital status: Widowed    Spouse name: N/A  . Number of children: N/A  . Years of education: N/A   Occupational History  . Not on file.   Social History Main Topics  . Smoking status: Never Smoker  . Smokeless tobacco: Never Used  . Alcohol use No  . Drug use: No  . Sexual activity: Not on file   Other Topics Concern  . Not on file   Social History Narrative   . No narrative on file    Family History  Problem Relation Age of Onset  . Bradycardia Mother   . Stroke Father     PHYSICAL EXAM: Vitals:   11/02/15 0413 11/02/15 0900  BP: (!) 163/48 (!) 166/62  Pulse: (!) 58 75  Resp: 19  18  Temp: 97.6 F (36.4 C)      Intake/Output Summary (Last 24 hours) at 11/02/15 1620 Last data filed at 11/02/15 1310  Gross per 24 hour  Intake          2885.78 ml  Output              200 ml  Net          2685.78 ml    General:  Chronically ill-appearing. No respiratory difficulty HEENT: normal Neck: supple. no JVD. Carotids 2+ bilat; no bruits. No lymphadenopathy or thryomegaly appreciated. Cor: PMI nondisplaced. Regular rate & rhythm. No rubs, gallops or murmurs. Lungs: clear Abdomen: soft, nontender, nondistended. No hepatosplenomegaly. No bruits or masses. Good bowel sounds. Extremities: no cyanosis, clubbing, rash, 2+ LE edema bilaterally.  R great toe gangrene Neuro: alert & oriented x 3, cranial nerves grossly intact. moves all 4 extremities w/o difficulty. Affect pleasant.  Results for orders placed or performed during the hospital encounter of 10/28/15 (from the past 24 hour(s))  Glucose, capillary     Status: Abnormal   Collection Time: 11/01/15  8:57 PM  Result Value Ref Range   Glucose-Capillary 180 (H) 65 - 99 mg/dL  Heparin level (unfractionated)     Status: None   Collection Time: 11/02/15  1:04 AM  Result Value Ref Range   Heparin Unfractionated 0.44 0.30 - 0.70 IU/mL  Comprehensive metabolic panel     Status: Abnormal   Collection Time: 11/02/15  2:57 AM  Result Value Ref Range   Sodium 136 135 - 145 mmol/L   Potassium 3.5 3.5 - 5.1 mmol/L   Chloride 105 101 - 111 mmol/L   CO2 25 22 - 32 mmol/L   Glucose, Bld 119 (H) 65 - 99 mg/dL   BUN 29 (H) 6 - 20 mg/dL   Creatinine, Ser 1.89 (H) 0.61 - 1.24 mg/dL   Calcium 8.4 (L) 8.9 - 10.3 mg/dL   Total Protein 5.4 (L) 6.5 - 8.1 g/dL   Albumin 1.9 (L) 3.5 - 5.0 g/dL   AST 19  15 - 41 U/L   ALT 14 (L) 17 - 63 U/L   Alkaline Phosphatase 57 38 - 126 U/L   Total Bilirubin 0.9 0.3 - 1.2 mg/dL   GFR calc non Af Amer 33 (L) >60 mL/min   GFR calc Af Amer 38 (L) >60 mL/min   Anion gap 6 5 - 15  CBC     Status: None   Collection Time: 11/02/15  2:57 AM  Result Value Ref Range   WBC 9.6 4.0 - 10.5 K/uL   RBC 4.37 4.22 - 5.81 MIL/uL   Hemoglobin 13.5 13.0 - 17.0 g/dL   HCT 40.7 39.0 - 52.0 %   MCV 93.1 78.0 - 100.0 fL   MCH 30.9 26.0 - 34.0 pg   MCHC 33.2 30.0 - 36.0 g/dL   RDW 14.3 11.5 - 15.5 %   Platelets 287 150 - 400 K/uL  Magnesium     Status: None   Collection Time: 11/02/15  2:57 AM  Result Value Ref Range   Magnesium 2.1 1.7 - 2.4 mg/dL  Glucose, capillary     Status: None   Collection Time: 11/02/15  7:48 AM  Result Value Ref Range   Glucose-Capillary 83 65 - 99 mg/dL  Protime-INR     Status: Abnormal   Collection Time: 11/02/15  8:17 AM  Result Value Ref Range   Prothrombin Time 22.0 (H) 11.4 - 15.2 seconds  INR 1.90   Heparin level (unfractionated)     Status: None   Collection Time: 11/02/15  8:17 AM  Result Value Ref Range   Heparin Unfractionated 0.45 0.30 - 0.70 IU/mL  Glucose, capillary     Status: Abnormal   Collection Time: 11/02/15 11:41 AM  Result Value Ref Range   Glucose-Capillary 115 (H) 65 - 99 mg/dL   No results found.  Echo 10/31/15: Study Conclusions  - Left ventricle: The cavity size was normal. There was moderate   concentric hypertrophy. Systolic function was vigorous. The   estimated ejection fraction was in the range of 65% to 70%. Wall   motion was normal; there were no regional wall motion   abnormalities. Features are consistent with a pseudonormal left   ventricular filling pattern, with concomitant abnormal relaxation   and increased filling pressure (grade 2 diastolic dysfunction). - Aortic valve: Trileaflet; mildly thickened, mildly calcified   leaflets. - Mitral valve: Moderately calcified annulus. -  Left atrium: The atrium was mildly dilated. Volume/bsa, ES,   (1-plane Simpson&'s, A2C): 36.1 ml/m^2. - Right atrium: The atrium was moderately dilated. - Pulmonary arteries: Systolic pressure was moderately increased.   PA peak pressure: 53 mm Hg (S).  ECG: Sinus rhythm. Rate 62 bpm. Prior anteroseptal infarct.   ASSESSMENT/PLAN:  # Pre-operative risk assessment:   The patient does not have any unstable cardiac conditions.  Upon evaluation today, he can achieve 4 METs or greater without anginal symptoms.  According to [facility] and AHA guidelines, he requires no further cardiac workup prior to his noncardiac surgery and should be at acceptable risk. Our service is available as necessary in the perioperative period.  # Hyperlipidemia:  We will check fasting lipids.  Continue rosuvastatin   # Pulmonary hypertension:  Mr. Majeed has moderate pulmonary hypertension.  Recommend outpatient sleep study and PFTs.  This may also be due to left-sided heart disease. He does have grade 2 diastolic dysfunction. Recommend diuresis with stable per nephrology.  There is no evidence of liver disease by laboratory testing.  We will check an HIV, ANA, rheumatoid factor, and ANCA.  Given that he is on chronic anticoagulation.PE is unlikely   # Hypertension: BP is not well-controlled.  Continue metoprolol.  We will increase amlodipine to 10 mg daily.  # PAF:  Rates are well-controlled.  Coumadin anticoagulation is currently held pending surgery. Continue heparin. Continue digoxin and metoprolol.  Signed: Karlos Scadden C. Oval Linsey, MD, [facility]  11/02/2015, 4:20 PM

## 2015-11-02 NOTE — Plan of Care (Signed)
Problem: Activity: Goal: Risk for activity intolerance will decrease Outcome: Progressing Out of bed to chair this morning. Stand by assist.   Problem: Fluid Volume: Goal: Ability to maintain a balanced intake and output will improve Outcome: Progressing Monitoring I and O for IV and PO. No signs or symptoms of volume imbalance.   Problem: Nutrition: Goal: Adequate nutrition will be maintained Outcome: Progressing Encouraging PO intake. Patient taking protein supplements, prostat and glucerna, without difficulty.

## 2015-11-02 NOTE — Plan of Care (Signed)
Problem: Physical Regulation: Goal: Ability to avoid or minimize complications of infection will improve Outcome: Progressing IV antibiotics continuing. Temp normal. Redness to L foot stable.

## 2015-11-03 LAB — CBC
HCT: 40.9 % (ref 39.0–52.0)
HEMOGLOBIN: 13.3 g/dL (ref 13.0–17.0)
MCH: 30.4 pg (ref 26.0–34.0)
MCHC: 32.5 g/dL (ref 30.0–36.0)
MCV: 93.4 fL (ref 78.0–100.0)
PLATELETS: 297 10*3/uL (ref 150–400)
RBC: 4.38 MIL/uL (ref 4.22–5.81)
RDW: 14.6 % (ref 11.5–15.5)
WBC: 10 10*3/uL (ref 4.0–10.5)

## 2015-11-03 LAB — COMPREHENSIVE METABOLIC PANEL
ALBUMIN: 1.8 g/dL — AB (ref 3.5–5.0)
ALK PHOS: 67 U/L (ref 38–126)
ALT: 14 U/L — AB (ref 17–63)
ANION GAP: 4 — AB (ref 5–15)
AST: 21 U/L (ref 15–41)
BILIRUBIN TOTAL: 1.1 mg/dL (ref 0.3–1.2)
BUN: 29 mg/dL — ABNORMAL HIGH (ref 6–20)
CALCIUM: 8.6 mg/dL — AB (ref 8.9–10.3)
CO2: 22 mmol/L (ref 22–32)
CREATININE: 1.88 mg/dL — AB (ref 0.61–1.24)
Chloride: 114 mmol/L — ABNORMAL HIGH (ref 101–111)
GFR calc Af Amer: 39 mL/min — ABNORMAL LOW (ref >60)
GFR calc non Af Amer: 33 mL/min — ABNORMAL LOW (ref >60)
GLUCOSE: 108 mg/dL — AB (ref 65–99)
Potassium: 3.8 mmol/L (ref 3.5–5.1)
Sodium: 140 mmol/L (ref 135–145)
TOTAL PROTEIN: 5.1 g/dL — AB (ref 6.5–8.1)

## 2015-11-03 LAB — PROTIME-INR
INR: 1.87
Prothrombin Time: 21.8 seconds — ABNORMAL HIGH (ref 11.4–15.2)

## 2015-11-03 LAB — GLUCOSE, CAPILLARY
GLUCOSE-CAPILLARY: 141 mg/dL — AB (ref 65–99)
Glucose-Capillary: 83 mg/dL (ref 65–99)
Glucose-Capillary: 138 mg/dL — ABNORMAL HIGH (ref 65–99)
Glucose-Capillary: 214 mg/dL — ABNORMAL HIGH (ref 65–99)

## 2015-11-03 LAB — HEPARIN LEVEL (UNFRACTIONATED): HEPARIN UNFRACTIONATED: 0.54 [IU]/mL (ref 0.30–0.70)

## 2015-11-03 MED ORDER — ONDANSETRON HCL 4 MG/2ML IJ SOLN: 4.0000 mg | Freq: Four times a day (QID) | INTRAMUSCULAR | Status: DC | PRN

## 2015-11-03 NOTE — Progress Notes (Signed)
Patient ID: Mark [name], male   DOB: Sep 28, 1940, 75 y.o.   MRN: [id number]                                                                PROGRESS NOTE                                                                                                                                                                                                             Patient Demographics:    Mark [name], is a 75 y.o. male, DOB - 1941-03-05, [id number]  Admit date - 10/28/2015   Admitting Physician Etta Quill, DO  Outpatient Primary MD for the patient is Rochel Brome, MD  LOS - 6  Outpatient Specialists:   Chief Complaint  Patient presents with  . 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 Patient presents to ED with c/o 'his toe is dead'. This onset about 1 month ago. Finally saw doctor today after he fell getting out of the shower this morning. His doctor was concerned about infection and sent him to the ED. No fever no vomiting.Toe found to begrossly infected and necrotic . Patient followed by [name], DPM    Subjective:    [name] today has been afebrile. Black toe stable.  Per pt.    No headache, No chest pain, No abdominal pain - No Nausea, No new weakness tingling or numbness, No Cough - SOB.    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 Starr Sinclair: The patient will need an arteriogram to evaluate blood flow to his left foot.Situation complicated by renal transplant with elevated creatinine, conton  gentle IV fluids to prevent renal failure, Waiting for INR to trend down. angiogram likely on Monday or Tuesday  RN is going to speak to vascular regarding access and possible need for IJ, apparently peripheral iv has been stable for now.   Chronic kidney disease stage IV, status post renal transplant , baseline creatinine around 1.7 creatinine has been trending up over the past year, but improved since admission, continue gentle IV hydration, follow creatinine closely. Nephrology consulted to monitor patient's renal function in the setting of upcoming procedure/angiogram.Continue anti-rejection meds, appreciate [name] following along during this hospitalization, Prograf level was low and [name] increased prograf, appreciate his input   A fib- Holding coumadin, heparin per pharmacy, rate controlled , preop 2-D echo pending, EKG/telemetry shows A. fib rate controlled, cont digoxin, metoprolol, Cardiology consulted for preop clearance , pt has been cleared by cardiology for surgery  DM2 -continueLantus to 36 units, contpre-meal NovoLog 5 units.Moderate dose mealtime and SSI AC/HS Hemoglobin A1c 8.2  Hypertension: uncontrolled We increased norvasc to 10mg  po qday , continue to monitor bp May need additional medication  Hypothyroidism-continue Synthroid  Protein Calorie Malnutrition prostat  Dyslipidemia-continue Crestor     Code Status : FULL CODE  Family Communication  :   Disposition Plan  :  ? Rehab vs home  Barriers For Discharge :   Consults  :  Vascular surgery, nephrology, cardiology  Procedures  :   DVT Prophylaxis  :  Heparin     Lab Results  Component Value Date   PLT 297 11/03/2015    Antibiotics  :    Anti-infectives     Start     Dose/Rate Route Frequency Ordered Stop   10/29/15 2200  vancomycin (VANCOCIN) IVPB 1000 mg/200 mL premix     1,000 mg 200 mL/hr over 60 Minutes Intravenous Every 24 hours 10/28/15 2154     10/28/15 2200  piperacillin-tazobactam (ZOSYN) IVPB 3.375 g     3.375 g 12.5 mL/hr over 240 Minutes Intravenous Every 8 hours 10/28/15 2120     10/28/15 2200  vancomycin (VANCOCIN) 2,500 mg in sodium chloride 0.9 % 500 mL IVPB     2,500 mg 250 mL/hr over 120 Minutes Intravenous  Once 10/28/15 2154 10/29/15 2330        Objective:   Vitals:   11/02/15 1600 11/02/15 2117 11/03/15 0503 11/03/15 0828  BP: (!) 142/78 (!) 188/68 (!) 178/84 (!) 184/63  Pulse: 68 70 (!) 58 72  Resp: 20 19 19 18   Temp: 97.9 F (36.6 C) 97.6 F (36.4 C) 97.9 F (36.6 C) 97.8 F (36.6 C)  TempSrc: Oral Oral Oral Oral  SpO2: 99% 100% 99% 100%  Weight:  101.5 kg (223 lb 11.2 oz)    Height:        Wt Readings from Last 3 Encounters:  11/02/15 101.5 kg (223 lb 11.2 oz)  09/05/15 103.1 kg (227 lb 3.2 oz)  08/05/15 101.8 kg (224 lb 6.4 oz)     Intake/Output Summary (Last 24 hours) at 11/03/15 1606 Last data filed at 11/03/15 1500  Gross per 24 hour  Intake          3829.75 ml  Output              125 ml  Net          3704.75 ml     Physical Exam  Awake Alert, Oriented X 3, No new F.N deficits, Normal affect [place].AT,PERRAL Supple Neck,No JVD, No cervical  lymphadenopathy appriciated.  Symmetrical Chest wall movement, Good air movement bilaterally, CTAB RRR,No Gallops,Rubs or new Murmurs, No Parasternal Heave +ve B.Sounds, Abd Soft, No tenderness, No organomegaly appriciated, No rebound - guarding or rigidity. No edema.  1st left foot black.    Data Review:    CBC  Recent Labs Lab 10/28/15 1919  10/30/15 0358 10/31/15 0430 11/01/15 0628 11/02/15 0257 11/03/15 0450  WBC 14.3*  < > 9.6 8.5 11.6* 9.6 10.0  HGB 15.0  < > 13.4 13.2 14.1 13.5 13.3  HCT 44.9  < > 41.7 39.9 41.9 40.7 40.9  PLT  327  < > 282 275 299 287 297  MCV 93.2  < > 93.9 93.0 91.9 93.1 93.4  MCH 31.1  < > 30.2 30.8 30.9 30.9 30.4  MCHC 33.4  < > 32.1 33.1 33.7 33.2 32.5  RDW 14.3  < > 14.1 14.2 14.3 14.3 14.6  LYMPHSABS 1.9  --   --   --   --   --   --   MONOABS 0.9  --   --   --   --   --   --   EOSABS 0.0  --   --   --   --   --   --   BASOSABS 0.0  --   --   --   --   --   --   < > = values in this interval not displayed.  Chemistries   Recent Labs Lab 10/29/15 1233 10/30/15 0358 10/31/15 0430 11/01/15 0628 11/02/15 0257 11/03/15 0450  NA  --  137  138 140 140 136 140  K  --  4.2  4.2 4.2 3.7 3.5 3.8  CL  --  104  102 104 106 105 114*  CO2  --  27  27 30 24 25 22   GLUCOSE  --  225*  218* 147* 134* 119* 108*  BUN  --  31*  30* 26* 23* 29* 29*  CREATININE  --  2.14*  1.99* 1.85* 1.72* 1.89* 1.88*  CALCIUM  --  8.4*  8.5* 8.7* 8.9 8.4* 8.6*  MG 1.9  --   --   --  2.1  --   AST  --  22 19 21 19 21   ALT  --  13* 12* 12* 14* 14*  ALKPHOS  --  53 50 58 57 67  BILITOT  --  1.2 1.3* 1.3* 0.9 1.1   ------------------------------------------------------------------------------------------------------------------ No results for input(s): CHOL, HDL, LDLCALC, TRIG, CHOLHDL, LDLDIRECT in the last 72 hours.  Lab Results  Component Value Date   HGBA1C 8.2 (H) 10/29/2015   ------------------------------------------------------------------------------------------------------------------ No results for input(s): TSH, T4TOTAL, T3FREE, THYROIDAB in the last 72 hours.  Invalid input(s): FREET3 ------------------------------------------------------------------------------------------------------------------ No results for input(s): VITAMINB12, FOLATE, FERRITIN, TIBC, IRON, RETICCTPCT in the last 72 hours.  Coagulation profile  Recent Labs Lab 10/31/15 0430 10/31/15 1644 11/01/15 0628 11/02/15 0817 11/03/15 0450  INR 2.18 2.14 1.97 1.90 1.87    No results for input(s): DDIMER in the last  72 hours.  Cardiac Enzymes No results for input(s): CKMB, TROPONINI, MYOGLOBIN in the last 168 hours.  Invalid input(s): CK ------------------------------------------------------------------------------------------------------------------ No results found for: BNP  Inpatient Medications  Scheduled Meds: . allopurinol  150 mg Oral Daily  . amLODipine  10 mg Oral Daily  . calcitRIOL  0.5 mcg Oral Daily  . digoxin  0.125 mg Oral Daily  . famotidine  20 mg Oral QHS  . feeding  supplement (GLUCERNA SHAKE)  237 mL Oral Q1500  . feeding supplement (PRO-STAT SUGAR FREE 64)  30 mL Oral BID BM  . insulin aspart  0-15 Units Subcutaneous TID WC  . insulin aspart  5 Units Subcutaneous TID WC  . insulin glargine  36 Units Subcutaneous QHS  . levothyroxine  137 mcg Oral QAC breakfast  . magnesium oxide  400 mg Oral BID  . metoprolol succinate  100 mg Oral Daily  . phosphorus  250 mg Oral BID  . piperacillin-tazobactam (ZOSYN)  IV  3.375 g Intravenous Q8H  . potassium chloride  20 mEq Oral BID  . predniSONE  5 mg Oral Daily  . rosuvastatin  10 mg Oral QPM  . tacrolimus  1 mg Oral BID  . vancomycin  1,000 mg Intravenous Q24H   Continuous Infusions: . sodium chloride 75 mL/hr at 11/03/15 1500  . heparin 1,600 Units/hr (11/03/15 1049)   PRN Meds:.acetaminophen, hydrALAZINE  Micro Results Recent Results (from the past 240 hour(s))  Blood Cultures x 2 sites     Status: None   Collection Time: 10/28/15 10:00 PM  Result Value Ref Range Status   Specimen Description BLOOD RIGHT FOREARM  Final   Special Requests BOTTLES DRAWN AEROBIC AND ANAEROBIC 4CC  Final   Culture NO GROWTH 5 DAYS  Final   Report Status 11/02/2015 FINAL  Final  Blood Cultures x 2 sites     Status: None   Collection Time: 10/28/15 11:04 PM  Result Value Ref Range Status   Specimen Description BLOOD LEFT ARM  Final   Special Requests AEROBIC BOTTLE ONLY 7ML  Final   Culture NO GROWTH 5 DAYS  Final   Report Status  11/02/2015 FINAL  Final    Radiology Reports Dg Foot Complete Left  Result Date: 10/28/2015 CLINICAL DATA:  Left big toe pain. EXAM: LEFT FOOT - COMPLETE 3+ VIEW COMPARISON:  None. FINDINGS: There is no evidence of fracture or dislocation. No evidence of dystrophic bony changes. There is soft tissue swelling of the distal first digit. Punctate lucencies within the area of soft tissue swelling may represent entrapped fat or potentially foci of gas. Heavy vascular calcifications are seen. IMPRESSION: No evidence of fracture of the left foot. Soft tissue swelling of the distal first digit with punctate lucencies within the area of swelling, which may represent entrapped fat or potentially foci of gas. Please correlate clinically regarding the possibility of cellulitis. Electronically Signed   By: Fidela Salisbury M.D.   On: 10/28/2015 21:01   Time Spent in minutes  30   Jani Gravel M.D on 11/03/2015 at 4:06 PM  Between 7am to 7pm - Pager - [phone]  After 7pm go to [email or web address] - password [facility]  [facility] -  Office  [phone]

## 2015-11-03 NOTE — Progress Notes (Signed)
Patient c/o nausea stating her burped up his protein. MD notified. Order received. Patient then requested assistance to bathroom. Nausea relived. Will continue to monitor. Bartholomew Crews, RN

## 2015-11-03 NOTE — Plan of Care (Signed)
Problem: Nutrition: Goal: Adequate nutrition will be maintained Outcome: Progressing Continuing to encourage PO intake at meals. Pt compliant with protein supplements.

## 2015-11-03 NOTE — Progress Notes (Signed)
Pharmacy Antibiotic Note Mark [name] is a 75 y.o. male admitted on 10/28/2015 with diabetic foot infection in setting of chronic immunosuppression for renal txp. Currently on day 6 of Zosyn and vancomycin for treatment. Therapeutic vancomycin trough was obtained on 7/27. Renal fxn has remained stable.    Plan: 1. Continue current dose of vancomycin and Zosyn 2. If remains on vancomycin will obtain weekly troughs unless clinical course dictates otherwise 3. SCr Q 72H while on vancomycin   Height: 5\' 9"  (175.3 cm) Weight: 223 lb 11.2 oz (101.5 kg) IBW/kg (Calculated) : 70.7  Temp (24hrs), Avg:97.8 F (36.6 C), Min:97.6 F (36.4 C), Max:97.9 F (36.6 C)   Recent Labs Lab 10/28/15 1940  10/30/15 0358 10/31/15 0430 10/31/15 2155 11/01/15 0628 11/02/15 0257 11/03/15 0450  WBC  --   < > 9.6 8.5  --  11.6* 9.6 10.0  CREATININE  --   < > 2.14*  1.99* 1.85*  --  1.72* 1.89* 1.88*  LATICACIDVEN 3.47*  --   --   --   --   --   --   --   VANCOTROUGH  --   --   --   --  15  --   --   --   < > = values in this interval not displayed.  Estimated Creatinine Clearance: 39.9 mL/min (by C-G formula based on SCr of 1.88 mg/dL).    Allergies  Allergen Reactions  . Tape Other (See Comments)    SKIN WILL TEAR!!    Antimicrobials this admission: Zosyn 7/24>> Vancomycin 7/24>>   Microbiology results: 7/24 BCx: ngF  Thank you for allowing pharmacy to be a part of this patient's care.  Vincenza Hews, PharmD, BCPS 11/03/2015, 10:40 AM Pager: [phone]

## 2015-11-03 NOTE — Progress Notes (Signed)
ANTICOAGULATION CONSULT NOTE - Follow Up Consult  Pharmacy Consult for heparin Indication: atrial fibrillation  Labs:  Recent Labs  11/01/15 0628  11/02/15 0104 11/02/15 0257 11/02/15 0817 11/03/15 0450  HGB 14.1  --   --  13.5  --  13.3  HCT 41.9  --   --  40.7  --  40.9  PLT 299  --   --  287  --  297  LABPROT 22.7*  --   --   --  22.0* 21.8*  INR 1.97  --   --   --  1.90 1.87  HEPARINUNFRC  --   < > 0.44  --  0.45 0.54  CREATININE 1.72*  --   --  1.89*  --  1.88*  < > = values in this interval not displayed.   Assessment: 75 yo male on Coumadin PTA for Afib. Coumadin on hold an being bridged with IV heparin in anticipation of arteriogram this upcoming week. Heparin level remains therapeutic at 0.54 on infusion rate of 1600 units/hr. CBC stable.    Goal of Therapy:  Heparin level 0.3-0.7 units/ml Monitor platelets by anticoagulation protocol: Yes    Plan:  - Continue heparin infusion at 1600 units/hr - Daily HL, CBC - Monitor s/s of bleeding - F/u resuming coumadin when feasible  Vincenza Hews, PharmD, BCPS 11/03/2015, 10:39 AM Pager: [phone]

## 2015-11-03 NOTE — Consult Note (Signed)
[facility]  Subjective  - no complaints   Objective (!) 184/63 72 97.8 F (36.6 C) (Oral) 18 100%  Intake/Output Summary (Last 24 hours) at 11/03/15 0919 Last data filed at 11/03/15 0503  Gross per 24 hour  Intake             2973 ml  Output              125 ml  Net             2848 ml   Left first toe gangrene with erythema extending into forefoot non tender  Assessment/Planning: First toe gangrene angio later this week. Dr Trula Slade to see tomorrow  Ruta Hinds 11/03/2015 9:19 AM --  Laboratory Lab Results:  Recent Labs  11/02/15 0257 11/03/15 0450  WBC 9.6 10.0  HGB 13.5 13.3  HCT 40.7 40.9  PLT 287 297   BMET  Recent Labs  11/02/15 0257 11/03/15 0450  NA 136 140  K 3.5 3.8  CL 105 114*  CO2 25 22  GLUCOSE 119* 108*  BUN 29* 29*  CREATININE 1.89* 1.88*  CALCIUM 8.4* 8.6*    COAG Lab Results  Component Value Date   INR 1.87 11/03/2015   INR 1.90 11/02/2015   INR 1.97 11/01/2015   No results found for: PTT

## 2015-11-03 NOTE — Progress Notes (Signed)
BP reassessment   11/03/15 1621  Vitals  BP (!) 160/69  BP Location Right Arm  BP Method Automatic  Patient Position (if appropriate) Lying  Pulse Rate 61  Pulse Rate Source Monitor  Oxygen Therapy  SpO2 98 %  O2 Device Room Air

## 2015-11-03 NOTE — Progress Notes (Addendum)
Received call from central telemetry that patient was having frequent and multifocal PVCs. Noted frequent pauses and vent rhythm. Noted irregularities have been occurring consistently throughout this hospitalization. Patient asymptomatic. MD notified. BP assessed. 172/67. Hydralazine 10 mg per parameters of prn order for systolic greater than 0000000. Reassessed bp with improvement. Will continue to monitor. Bartholomew Crews, RN

## 2015-11-04 LAB — COMPREHENSIVE METABOLIC PANEL
ALT: 16 U/L — AB (ref 17–63)
ANION GAP: 5 (ref 5–15)
AST: 26 U/L (ref 15–41)
Albumin: 2.1 g/dL — ABNORMAL LOW (ref 3.5–5.0)
Alkaline Phosphatase: 88 U/L (ref 38–126)
BUN: 30 mg/dL — ABNORMAL HIGH (ref 6–20)
CALCIUM: 8.8 mg/dL — AB (ref 8.9–10.3)
CHLORIDE: 113 mmol/L — AB (ref 101–111)
CO2: 21 mmol/L — ABNORMAL LOW (ref 22–32)
CREATININE: 1.99 mg/dL — AB (ref 0.61–1.24)
GFR, EST AFRICAN AMERICAN: 36 mL/min — AB (ref >60)
GFR, EST NON AFRICAN AMERICAN: 31 mL/min — AB (ref >60)
Glucose, Bld: 156 mg/dL — ABNORMAL HIGH (ref 65–99)
Potassium: 4 mmol/L (ref 3.5–5.1)
Sodium: 139 mmol/L (ref 135–145)
Total Bilirubin: 1 mg/dL (ref 0.3–1.2)
Total Protein: 5.8 g/dL — ABNORMAL LOW (ref 6.5–8.1)

## 2015-11-04 LAB — CBC
HCT: 43.4 % (ref 39.0–52.0)
HEMOGLOBIN: 13.8 g/dL (ref 13.0–17.0)
MCH: 30.3 pg (ref 26.0–34.0)
MCHC: 31.8 g/dL (ref 30.0–36.0)
MCV: 95.2 fL (ref 78.0–100.0)
Platelets: 335 10*3/uL (ref 150–400)
RBC: 4.56 MIL/uL (ref 4.22–5.81)
RDW: 15 % (ref 11.5–15.5)
WBC: 10.7 10*3/uL — ABNORMAL HIGH (ref 4.0–10.5)

## 2015-11-04 LAB — MPO/PR-3 (ANCA) ANTIBODIES: ANCA Proteinase 3: <3.5 U/mL (ref 0.0–3.5)

## 2015-11-04 LAB — HEPARIN LEVEL (UNFRACTIONATED): HEPARIN UNFRACTIONATED: 0.71 [IU]/mL — AB (ref 0.30–0.70)

## 2015-11-04 LAB — PROTIME-INR
INR: 1.64
PROTHROMBIN TIME: 19.6 s — AB (ref 11.4–15.2)

## 2015-11-04 LAB — GLUCOSE, CAPILLARY
GLUCOSE-CAPILLARY: 167 mg/dL — AB (ref 65–99)
Glucose-Capillary: 133 mg/dL — ABNORMAL HIGH (ref 65–99)
Glucose-Capillary: 136 mg/dL — ABNORMAL HIGH (ref 65–99)
Glucose-Capillary: 72 mg/dL (ref 65–99)

## 2015-11-04 LAB — C DIFFICILE QUICK SCREEN W PCR REFLEX
C DIFFICILE (CDIFF) INTERP: NOT DETECTED
C DIFFICILE (CDIFF) TOXIN: NEGATIVE
C Diff antigen: NEGATIVE

## 2015-11-04 NOTE — Progress Notes (Signed)
  Progress Note    11/04/2015 8:54 AM Hospital Day 7  Subjective:  No complaints.    Vitals:   11/04/15 0504 11/04/15 0824  BP: (!) 145/89 (!) 139/94  Pulse: 63 63  Resp: 20 18  Temp: 97.5 F (36.4 C) 97.5 F (36.4 C)    Physical Exam: Extremities:  Left great toe with gangrenous changes; erythema on the forefoot appears slightly better.   CBC    Component Value Date/Time   WBC 10.7 (H) 11/04/2015 0443   RBC 4.56 11/04/2015 0443   HGB 13.8 11/04/2015 0443   HCT 43.4 11/04/2015 0443   PLT 335 11/04/2015 0443   MCV 95.2 11/04/2015 0443   MCH 30.3 11/04/2015 0443   MCHC 31.8 11/04/2015 0443   RDW 15.0 11/04/2015 0443   LYMPHSABS 1.9 10/28/2015 1919   MONOABS 0.9 10/28/2015 1919   EOSABS 0.0 10/28/2015 1919   BASOSABS 0.0 10/28/2015 1919    BMET    Component Value Date/Time   NA 139 11/04/2015 0443   K 4.0 11/04/2015 0443   CL 113 (H) 11/04/2015 0443   CO2 21 (L) 11/04/2015 0443   GLUCOSE 156 (H) 11/04/2015 0443   BUN 30 (H) 11/04/2015 0443   CREATININE 1.99 (H) 11/04/2015 0443   CALCIUM 8.8 (L) 11/04/2015 0443   GFRNONAA 31 (L) 11/04/2015 0443   GFRAA 36 (L) 11/04/2015 0443    INR    Component Value Date/Time   INR 1.64 11/04/2015 0443     Intake/Output Summary (Last 24 hours) at 11/04/15 0854 Last data filed at 11/04/15 0700  Gross per 24 hour  Intake          4298.55 ml  Output              127 ml  Net          4171.55 ml     Assessment/Plan:  75 y.o. male with a left gangrenous great toe Hospital Day 7  -toe unchanged from last week.  Erythema on forefoot appears slightly better.  Continue Abx. -pt needs an arteriogram to evaluate LE anatomy with possible intervention. -creatinine up some today to 1.99 -will d/w Dr. Trula Slade about timing of arteriogram.   Leontine Locket, PA-C [facility] [phone] 11/04/2015 8:54 AM    We'll proceed with arteriogram tomorrow. Nothing by mouth after midnight [place]

## 2015-11-04 NOTE — Progress Notes (Signed)
ANTICOAGULATION CONSULT NOTE - Follow Up Consult  Pharmacy Consult for heparin Indication: atrial fibrillation  Labs:  Recent Labs  11/01/15 0628  11/02/15 0257 11/02/15 0817 11/03/15 0450 11/04/15 0443  HGB 14.1  --  13.5  --  13.3 13.8  HCT 41.9  --  40.7  --  40.9 43.4  PLT 299  --  287  --  297 335  LABPROT 22.7*  --   --  22.0* 21.8* 19.6*  INR 1.97  --   --  1.90 1.87 1.64  HEPARINUNFRC  --   < >  --  0.45 0.54 0.71*  CREATININE 1.72*  --  1.89*  --  1.88*  --   < > = values in this interval not displayed.   Assessment: 75yo male now slightly above goal on heparin after several levels at goal; no signs of bleeding per RN.  Goal of Therapy:  Heparin level 0.3-0.7 units/ml   Plan:  Will decrease heparin gtt slightly to 1500 units/hr and check level in [place], PharmD, BCPS  11/04/2015,5:22 AM

## 2015-11-04 NOTE — Progress Notes (Signed)
PT Cancellation Note  Patient Details Name: Mark [name] MRN: [id number] DOB: 1940-09-06   Cancelled Treatment:    Reason Eval/Treat Not Completed: Patient declined, repeatedly stating that he isn't going to get out of bed today. Pt educated on need to mobilize and acknowledges that he knows he needs to but continues to refuse. PT to continue to follow as able.  (conversation held at [place])   Cassell Clement, [place], [place] Pager [phone] Office [phone]  11/04/2015, 12:58 PM

## 2015-11-04 NOTE — Progress Notes (Addendum)
Patient ID: Mark [name], male   DOB: Jul 13, 1940, 75 y.o.   MRN: [id number]                                                                PROGRESS NOTE                                                                                                                                                                                                             Patient Demographics:    Mark [name], is a 75 y.o. male, DOB - July 26, 1940, [id number]  Admit date - 10/28/2015   Admitting Physician Etta Quill, DO  Outpatient Primary MD for the patient is Rochel Brome, MD  LOS - 7  Outpatient Specialists:   Chief Complaint  Patient presents with  . 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 Patient presents to ED with c/o 'his toe is dead'. This onset about 1 month ago. Finally saw doctor today after he fell getting out of the shower this morning. His doctor was concerned about infection and sent him to the ED. No fever no vomiting.Toe found to begrossly infected and necrotic . Patient followed by [name], DPM    Subjective:    Mark [name] today has been afebrile. Black toe stable. Complaining of left heel pain    No headache, No chest pain, No abdominal pain - No Nausea, No new weakness tingling or numbness, No Cough - SOB.    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 Starr Sinclair: The patient will need an arteriogram to evaluate blood flow to his left foot.Situation complicated by renal transplant with elevated  creatinine, conton gentle IV fluids to prevent renal failure, Waiting for INR to trend down. angiogram likely on Monday or Tuesday,d/w Dr. Trula Slade about timing of arteriogram    According to [facility] and AHA guidelines and cardiology, he requires no further cardiac workup prior to his noncardiac surgery and should be at acceptable risk  Chronic kidney disease stage IV, status post renal transplant , baseline creatinine around 1.7 creatinine has been trending up over the past year, but improved since admission, continue gentle IV hydration, follow creatinine closely. Creatinine probably at his new baseline today. Nephrology consulted to monitor patient's renal function in the setting of upcoming procedure/angiogram.Continue anti-rejection meds, appreciate [name] following along during this hospitalization, Prograf level was low and [name] increased prograf, appreciate his input   A fib- Holding coumadin, heparin per pharmacy, rate controlled , preop 2-D echo pending, EKG/telemetry shows A. fib rate controlled, cont digoxin, metoprolol, Cardiology consulted for preop clearance , pt has been cleared by cardiology for surgery  DM2 -continueLantus to 36 units, contpre-meal NovoLog 5 units.Moderate dose mealtime and SSI AC/HS Hemoglobin A1c 8.2  Hypertension: uncontrolled We increased norvasc to 10mg  po qday , continue to monitor bp May need additional medication  Hypothyroidism-continue Synthroid  Protein Calorie Malnutrition prostat  Dyslipidemia-continue Crestor  Pulmonary hypertension:  Mr. Usman has moderate pulmonary hypertension.  Recommend outpatient sleep study and PFTs.  This may also be due to left-sided heart disease. He does have grade 2 diastolic  dysfunction. Recommend diuresis with stable per nephrology.  There is no evidence of liver disease by laboratory testing   Code Status : FULL CODE  Family Communication  :   Disposition Plan  :  ? Rehab vs home  Barriers For Discharge :   Consults  :  Vascular surgery, nephrology, cardiology  Procedures  :   DVT Prophylaxis  :  Heparin     Lab Results  Component Value Date   PLT 335 11/04/2015    Antibiotics  :    Anti-infectives    Start     Dose/Rate Route Frequency Ordered Stop   10/29/15 2200  vancomycin (VANCOCIN) IVPB 1000 mg/200 mL premix     1,000 mg 200 mL/hr over 60 Minutes Intravenous Every 24 hours 10/28/15 2154     10/28/15 2200  piperacillin-tazobactam (ZOSYN) IVPB 3.375 g     3.375 g 12.5 mL/hr over 240 Minutes Intravenous Every 8 hours 10/28/15 2120     10/28/15 2200  vancomycin (VANCOCIN) 2,500 mg in sodium chloride 0.9 % 500 mL IVPB     2,500 mg 250 mL/hr over 120 Minutes Intravenous  Once 10/28/15 2154 10/29/15 2330        Objective:   Vitals:   11/03/15 1621 11/03/15 2119 11/04/15 0504 11/04/15 0824  BP: (!) 160/69 (!) 155/60 (!) 145/89 (!) 139/94  Pulse: 61 75 63 63  Resp:  (!) 21 20 18   Temp:  97.5 F (36.4 C) 97.5 F (36.4 C) 97.5 F (36.4 C)  TempSrc:  Oral Oral Oral  SpO2: 98% 99% 99% 100%  Weight:  101.5 kg (223 lb 12.3 oz)    Height:        Wt Readings from Last 3 Encounters:  11/03/15 101.5 kg (223 lb 12.3 oz)  09/05/15 103.1 kg (227 lb 3.2 oz)  08/05/15 101.8 kg (224 lb 6.4 oz)     Intake/Output Summary (Last 24 hours) at 11/04/15 1027 Last data filed at 11/04/15 0900  Gross per 24 hour  Intake  4298.55 ml  Output              127 ml  Net          4171.55 ml     Physical Exam  Awake Alert, Oriented X 3, No new F.N deficits, Normal affect [place].AT,PERRAL Supple Neck,No JVD, No cervical lymphadenopathy appriciated.  Symmetrical Chest wall movement, Good air movement bilaterally, CTAB RRR,No Gallops,Rubs or  new Murmurs, No Parasternal Heave +ve B.Sounds, Abd Soft, No tenderness, No organomegaly appriciated, No rebound - guarding or rigidity. No edema.  1st left foot black.    Data Review:    CBC  Recent Labs Lab 10/28/15 1919  10/31/15 0430 11/01/15 [id number] 11/02/15 0257 11/03/15 0450 11/04/15 0443  WBC 14.3*  < > 8.5 11.6* 9.6 10.0 10.7*  HGB 15.0  < > 13.2 14.1 13.5 13.3 13.8  HCT 44.9  < > 39.9 41.9 40.7 40.9 43.4  PLT 327  < > 275 299 287 297 335  MCV 93.2  < > 93.0 91.9 93.1 93.4 95.2  MCH 31.1  < > 30.8 30.9 30.9 30.4 30.3  MCHC 33.4  < > 33.1 33.7 33.2 32.5 31.8  RDW 14.3  < > 14.2 14.3 14.3 14.6 15.0  LYMPHSABS 1.9  --   --   --   --   --   --   MONOABS 0.9  --   --   --   --   --   --   EOSABS 0.0  --   --   --   --   --   --   BASOSABS 0.0  --   --   --   --   --   --   < > = values in this interval not displayed.  Chemistries   Recent Labs Lab 10/29/15 1233  10/31/15 0430 11/01/15 0628 11/02/15 0257 11/03/15 0450 11/04/15 0443  NA  --   < > 140 140 136 140 139  K  --   < > 4.2 3.7 3.5 3.8 4.0  CL  --   < > 104 106 105 114* 113*  CO2  --   < > 30 24 25 22  21*  GLUCOSE  --   < > 147* 134* 119* 108* 156*  BUN  --   < > 26* 23* 29* 29* 30*  CREATININE  --   < > 1.85* 1.72* 1.89* 1.88* 1.99*  CALCIUM  --   < > 8.7* 8.9 8.4* 8.6* 8.8*  MG 1.9  --   --   --  2.1  --   --   AST  --   < > 19 21 19 21 26   ALT  --   < > 12* 12* 14* 14* 16*  ALKPHOS  --   < > 50 58 57 67 88  BILITOT  --   < > 1.3* 1.3* 0.9 1.1 1.0  < > = values in this interval not displayed. ------------------------------------------------------------------------------------------------------------------ No results for input(s): CHOL, HDL, LDLCALC, TRIG, CHOLHDL, LDLDIRECT in the last 72 hours.  Lab Results  Component Value Date   HGBA1C 8.2 (H) 10/29/2015   ------------------------------------------------------------------------------------------------------------------ No results for  input(s): TSH, T4TOTAL, T3FREE, THYROIDAB in the last 72 hours.  Invalid input(s): FREET3 ------------------------------------------------------------------------------------------------------------------ No results for input(s): VITAMINB12, FOLATE, FERRITIN, TIBC, IRON, RETICCTPCT in the last 72 hours.  Coagulation profile  Recent Labs Lab 10/31/15 1644 11/01/15 0628 11/02/15 0817 11/03/15 0450 11/04/15 0443  INR 2.14 1.97 1.90  1.87 1.64    No results for input(s): DDIMER in the last 72 hours.  Cardiac Enzymes No results for input(s): CKMB, TROPONINI, MYOGLOBIN in the last 168 hours.  Invalid input(s): CK ------------------------------------------------------------------------------------------------------------------ No results found for: BNP  Inpatient Medications  Scheduled Meds: . allopurinol  150 mg Oral Daily  . amLODipine  10 mg Oral Daily  . calcitRIOL  0.5 mcg Oral Daily  . digoxin  0.125 mg Oral Daily  . famotidine  20 mg Oral QHS  . feeding supplement (GLUCERNA SHAKE)  237 mL Oral Q1500  . feeding supplement (PRO-STAT SUGAR FREE 64)  30 mL Oral BID BM  . insulin aspart  0-15 Units Subcutaneous TID WC  . insulin aspart  5 Units Subcutaneous TID WC  . insulin glargine  36 Units Subcutaneous QHS  . levothyroxine  137 mcg Oral QAC breakfast  . magnesium oxide  400 mg Oral BID  . metoprolol succinate  100 mg Oral Daily  . phosphorus  250 mg Oral BID  . piperacillin-tazobactam (ZOSYN)  IV  3.375 g Intravenous Q8H  . potassium chloride  20 mEq Oral BID  . predniSONE  5 mg Oral Daily  . rosuvastatin  10 mg Oral QPM  . tacrolimus  1 mg Oral BID  . vancomycin  1,000 mg Intravenous Q24H   Continuous Infusions: . sodium chloride 75 mL/hr at 11/04/15 0654  . heparin 1,500 Units/hr (11/04/15 0533)   PRN Meds:.acetaminophen, hydrALAZINE, ondansetron (ZOFRAN) IV  Micro Results Recent Results (from the past 240 hour(s))  Blood Cultures x 2 sites     Status: None     Collection Time: 10/28/15 10:00 PM  Result Value Ref Range Status   Specimen Description BLOOD RIGHT FOREARM  Final   Special Requests BOTTLES DRAWN AEROBIC AND ANAEROBIC 4CC  Final   Culture NO GROWTH 5 DAYS  Final   Report Status 11/02/2015 FINAL  Final  Blood Cultures x 2 sites     Status: None   Collection Time: 10/28/15 11:04 PM  Result Value Ref Range Status   Specimen Description BLOOD LEFT ARM  Final   Special Requests AEROBIC BOTTLE ONLY 7ML  Final   Culture NO GROWTH 5 DAYS  Final   Report Status 11/02/2015 FINAL  Final    Radiology Reports Dg Foot Complete Left  Result Date: 10/28/2015 CLINICAL DATA:  Left big toe pain. EXAM: LEFT FOOT - COMPLETE 3+ VIEW COMPARISON:  None. FINDINGS: There is no evidence of fracture or dislocation. No evidence of dystrophic bony changes. There is soft tissue swelling of the distal first digit. Punctate lucencies within the area of soft tissue swelling may represent entrapped fat or potentially foci of gas. Heavy vascular calcifications are seen. IMPRESSION: No evidence of fracture of the left foot. Soft tissue swelling of the distal first digit with punctate lucencies within the area of swelling, which may represent entrapped fat or potentially foci of gas. Please correlate clinically regarding the possibility of cellulitis. Electronically Signed   By: Fidela Salisbury M.D.   On: 10/28/2015 21:01   Time Spent in minutes  71   Gilad Dugger M.D on 11/04/2015 at 10:27 AM  Between 7am to 7pm - Pager - [phone]  After 7pm go to [email or web address] - password [facility]  [facility] -  Office  [phone]

## 2015-11-04 NOTE — Progress Notes (Signed)
Nutrition Follow-up  DOCUMENTATION CODES:   Obesity unspecified  INTERVENTION:  Continue 30 ml Prostat po BID, each supplement provides 100 kcal and 15 grams of protein.   Continue Glucerna Shake po once daily, each supplement provides 220 kcal and 10 grams of protein.  Encourage adequate PO intake.   NUTRITION DIAGNOSIS:   Increased nutrient needs related to wound healing as evidenced by estimated needs; ongoing  GOAL:   Patient will meet greater than or equal to 90% of their needs; met  MONITOR:   PO intake, Supplement acceptance, Weight trends, Labs, Skin, I & O's  REASON FOR ASSESSMENT:   Consult Wound healing  ASSESSMENT:   75 y.o. 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  Patient presents to ED with c/o 'his toe is dead'.    Plans for arteriogram tomorrow to evaluate blood flow to left foot. Meal completion has been 50-100%. Pt currently has Glucerna shake and Prostat ordered and has been consuming most of them. RD to continue with orders to aid in adequate protein needs.   Labs and medications reviewed.   Diet Order:  Diet Carb Modified Fluid consistency: Thin; Room service appropriate? Yes  Skin:  Wound (see comment) (diabetic ulcer L great toe)  Last BM:  7/30  Height:   Ht Readings from Last 1 Encounters:  10/28/15 '5\' 9"'  (1.753 m)    Weight:   Wt Readings from Last 1 Encounters:  11/03/15 223 lb 12.3 oz (101.5 kg)    Ideal Body Weight:  72.7 kg  BMI:  Body mass index is 33.04 kg/m.  Estimated Nutritional Needs:   Kcal:  2000-2300  Protein:  105-125 grams  Fluid:  2-2.3 L/day  EDUCATION NEEDS:   Education needs addressed  Corrin Parker, MS, RD, LDN Pager # [phone] After hours/ weekend pager # [phone]

## 2015-11-04 NOTE — Progress Notes (Signed)
Patient had more than three loose stools in twenty four hours. Patient is on antibiotics. MD on call notified. Order received for stool c-diff.Placed patient on enteric isolation. Lerline Valdivia, Wonda Cheng, Therapist, sports

## 2015-11-05 ENCOUNTER — Encounter (HOSPITAL_COMMUNITY)
Admission: EM | Disposition: A | Discharge status: Skilled Nursing Facility | Payer: Self-pay | Source: Home / Self Care | Attending: Internal Medicine

## 2015-11-05 ENCOUNTER — Inpatient Hospital Stay (HOSPITAL_COMMUNITY): Payer: Medicare Other

## 2015-11-05 DIAGNOSIS — I739 Peripheral vascular disease, unspecified: Secondary | ICD-10-CM

## 2015-11-05 DIAGNOSIS — I70245 Atherosclerosis of native arteries of left leg with ulceration of other part of foot: Secondary | ICD-10-CM

## 2015-11-05 HISTORY — PX: PERIPHERAL VASCULAR CATHETERIZATION: SHX172C

## 2015-11-05 HISTORY — DX: Peripheral vascular disease, unspecified: I73.9

## 2015-11-05 LAB — COMPREHENSIVE METABOLIC PANEL
ALK PHOS: 102 U/L (ref 38–126)
ALT: 21 U/L (ref 17–63)
AST: 40 U/L (ref 15–41)
Albumin: 2.2 g/dL — ABNORMAL LOW (ref 3.5–5.0)
Anion gap: 7 (ref 5–15)
BUN: 27 mg/dL — AB (ref 6–20)
CALCIUM: 9 mg/dL (ref 8.9–10.3)
CO2: 20 mmol/L — AB (ref 22–32)
CREATININE: 1.88 mg/dL — AB (ref 0.61–1.24)
Chloride: 113 mmol/L — ABNORMAL HIGH (ref 101–111)
GFR calc non Af Amer: 33 mL/min — ABNORMAL LOW (ref >60)
GFR, EST AFRICAN AMERICAN: 39 mL/min — AB (ref >60)
GLUCOSE: 86 mg/dL (ref 65–99)
Potassium: 4.8 mmol/L (ref 3.5–5.1)
SODIUM: 140 mmol/L (ref 135–145)
Total Bilirubin: 1.3 mg/dL — ABNORMAL HIGH (ref 0.3–1.2)
Total Protein: 6.1 g/dL — ABNORMAL LOW (ref 6.5–8.1)

## 2015-11-05 LAB — CBC
HCT: 43.4 % (ref 39.0–52.0)
Hemoglobin: 14.2 g/dL (ref 13.0–17.0)
MCH: 31.1 pg (ref 26.0–34.0)
MCHC: 32.7 g/dL (ref 30.0–36.0)
MCV: 95.2 fL (ref 78.0–100.0)
PLATELETS: 329 10*3/uL (ref 150–400)
RBC: 4.56 MIL/uL (ref 4.22–5.81)
RDW: 15.2 % (ref 11.5–15.5)
WBC: 12.3 10*3/uL — ABNORMAL HIGH (ref 4.0–10.5)

## 2015-11-05 LAB — GLUCOSE, CAPILLARY
GLUCOSE-CAPILLARY: 87 mg/dL (ref 65–99)
GLUCOSE-CAPILLARY: 97 mg/dL (ref 65–99)
GLUCOSE-CAPILLARY: 56 mg/dL — AB (ref 65–99)
Glucose-Capillary: 75 mg/dL (ref 65–99)
Glucose-Capillary: 103 mg/dL — ABNORMAL HIGH (ref 65–99)
Glucose-Capillary: 111 mg/dL — ABNORMAL HIGH (ref 65–99)

## 2015-11-05 LAB — HIV-1 RNA, QUALITATIVE, TMA: HIV-1 RNA, QUAL: NEGATIVE

## 2015-11-05 LAB — ANCA TITERS

## 2015-11-05 LAB — POCT ACTIVATED CLOTTING TIME
ACTIVATED CLOTTING TIME: 257 s
ACTIVATED CLOTTING TIME: 285 s
ACTIVATED CLOTTING TIME: 175 s

## 2015-11-05 LAB — PROTIME-INR
INR: 1.54
Prothrombin Time: 18.6 seconds — ABNORMAL HIGH (ref 11.4–15.2)

## 2015-11-05 LAB — TROPONIN I: Troponin I: 0.09 ng/mL (ref <0.03)

## 2015-11-05 LAB — MAGNESIUM: Magnesium: 2.4 mg/dL (ref 1.7–2.4)

## 2015-11-05 LAB — HEPARIN LEVEL (UNFRACTIONATED): Heparin Unfractionated: 0.47 IU/mL (ref 0.30–0.70)

## 2015-11-05 SURGERY — ABDOMINAL AORTOGRAM W/LOWER EXTREMITY

## 2015-11-05 MED ORDER — INSULIN GLARGINE 100 UNIT/ML ~~LOC~~ SOLN
20.0000 [IU] | Freq: Every day | SUBCUTANEOUS | Status: DC
  Administered 2015-11-06 – 2015-11-23 (×18): 20 [IU] via SUBCUTANEOUS
  Filled 2015-11-05 (×22): qty 0.2

## 2015-11-05 MED ORDER — ONDANSETRON HCL 4 MG/2ML IJ SOLN
INTRAMUSCULAR | Status: DC | PRN
  Administered 2015-11-05: 4 mg via INTRAVENOUS

## 2015-11-05 MED ORDER — ALUM & MAG HYDROXIDE-SIMETH 200-200-20 MG/5ML PO SUSP
15.0000 mL | ORAL | Status: DC | PRN
  Filled 2015-11-05: qty 30

## 2015-11-05 MED ORDER — HEPARIN (PORCINE) IN NACL 2-0.9 UNIT/ML-% IJ SOLN
INTRAMUSCULAR | Status: AC
  Filled 2015-11-05: qty 1000

## 2015-11-05 MED ORDER — LIDOCAINE HCL (PF) 1 % IJ SOLN
INTRAMUSCULAR | Status: AC
  Filled 2015-11-05: qty 30

## 2015-11-05 MED ORDER — LIDOCAINE HCL (PF) 1 % IJ SOLN
INTRAMUSCULAR | Status: DC | PRN
  Administered 2015-11-05: 20 mL via SUBCUTANEOUS

## 2015-11-05 MED ORDER — LEVALBUTEROL HCL 1.25 MG/0.5ML IN NEBU
1.2500 mg | INHALATION_SOLUTION | Freq: Four times a day (QID) | RESPIRATORY_TRACT | Status: DC
  Administered 2015-11-05: 1.25 mg via RESPIRATORY_TRACT
  Filled 2015-11-05 (×3): qty 0.5

## 2015-11-05 MED ORDER — FUROSEMIDE 10 MG/ML IJ SOLN: 40.0000 mg | Freq: Once | INTRAMUSCULAR | Status: DC

## 2015-11-05 MED ORDER — GUAIFENESIN-DM 100-10 MG/5ML PO SYRP
15.0000 mL | ORAL_SOLUTION | ORAL | Status: DC | PRN
  Filled 2015-11-05: qty 15

## 2015-11-05 MED ORDER — VIPERSLIDE LUBRICANT OPTIME: TOPICAL | Status: DC | PRN

## 2015-11-05 MED ORDER — HEPARIN SODIUM (PORCINE) 1000 UNIT/ML IJ SOLN
INTRAMUSCULAR | Status: AC
  Filled 2015-11-05: qty 1

## 2015-11-05 MED ORDER — SODIUM CHLORIDE 0.9 % IV SOLN
INTRAVENOUS | Status: DC
  Administered 2015-11-05: 17:00:00 via INTRAVENOUS

## 2015-11-05 MED ORDER — ATROPINE SULFATE 1 MG/10ML IJ SOSY
PREFILLED_SYRINGE | INTRAMUSCULAR | Status: AC
  Filled 2015-11-05: qty 10

## 2015-11-05 MED ORDER — VIPERSLIDE LUBRICANT OPTIME
TOPICAL | Status: DC | PRN
  Administered 2015-11-05: 14:00:00 via SURGICAL_CAVITY

## 2015-11-05 MED ORDER — DOCUSATE SODIUM 100 MG PO CAPS
100.0000 mg | ORAL_CAPSULE | Freq: Every day | ORAL | Status: DC
  Administered 2015-11-08 – 2015-11-22 (×7): 100 mg via ORAL
  Filled 2015-11-05 (×11): qty 1

## 2015-11-05 MED ORDER — PHENOL 1.4 % MT LIQD
1.0000 | OROMUCOSAL | Status: DC | PRN
  Filled 2015-11-05: qty 177

## 2015-11-05 MED ORDER — LABETALOL HCL 5 MG/ML IV SOLN
10.0000 mg | INTRAVENOUS | Status: DC | PRN
  Administered 2015-11-08: 10 mg via INTRAVENOUS
  Filled 2015-11-05: qty 4

## 2015-11-05 MED ORDER — LEVALBUTEROL HCL 1.25 MG/0.5ML IN NEBU
1.2500 mg | INHALATION_SOLUTION | Freq: Four times a day (QID) | RESPIRATORY_TRACT | Status: DC | PRN
  Administered 2015-11-07 – 2015-11-09 (×3): 1.25 mg via RESPIRATORY_TRACT
  Filled 2015-11-05 (×4): qty 0.5

## 2015-11-05 MED ORDER — HEPARIN (PORCINE) IN NACL 100-0.45 UNIT/ML-% IJ SOLN
1500.0000 [IU]/h | INTRAMUSCULAR | Status: DC
  Filled 2015-11-05: qty 250

## 2015-11-05 MED ORDER — ONDANSETRON HCL 4 MG/2ML IJ SOLN
INTRAMUSCULAR | Status: AC
  Filled 2015-11-05: qty 2

## 2015-11-05 MED ORDER — HEPARIN (PORCINE) IN NACL 2-0.9 UNIT/ML-% IJ SOLN
INTRAMUSCULAR | Status: DC | PRN
  Administered 2015-11-05: 500 mL
  Administered 2015-11-05: 1000 mL

## 2015-11-05 MED ORDER — NITROGLYCERIN IN D5W 200-5 MCG/ML-% IV SOLN
INTRAVENOUS | Status: AC
  Filled 2015-11-05: qty 250

## 2015-11-05 MED ORDER — CEFAZOLIN IN D5W 1 GM/50ML IV SOLN: 1.0000 g | INTRAVENOUS | Status: DC

## 2015-11-05 MED ORDER — HEPARIN (PORCINE) IN NACL 2-0.9 UNIT/ML-% IJ SOLN
INTRAMUSCULAR | Status: AC
  Filled 2015-11-05: qty 500

## 2015-11-05 MED ORDER — VERAPAMIL HCL 2.5 MG/ML IV SOLN
INTRAVENOUS | Status: AC
  Filled 2015-11-05: qty 2

## 2015-11-05 MED ORDER — HEPARIN SODIUM (PORCINE) 1000 UNIT/ML IJ SOLN
INTRAMUSCULAR | Status: DC | PRN
  Administered 2015-11-05: 10000 [IU] via INTRAVENOUS

## 2015-11-05 MED ORDER — DEXTROSE 10 % IV SOLN
INTRAVENOUS | Status: DC
  Administered 2015-11-05 – 2015-11-06 (×2): via INTRAVENOUS

## 2015-11-05 MED ORDER — FUROSEMIDE 10 MG/ML IJ SOLN
60.0000 mg | Freq: Once | INTRAMUSCULAR | Status: AC
  Administered 2015-11-05: 60 mg via INTRAVENOUS
  Filled 2015-11-05: qty 6

## 2015-11-05 MED ORDER — METOPROLOL TARTRATE 5 MG/5ML IV SOLN: 2.0000 mg | INTRAVENOUS | Status: DC | PRN

## 2015-11-05 MED ORDER — LORAZEPAM 2 MG/ML IJ SOLN
0.5000 mg | Freq: Once | INTRAMUSCULAR | Status: AC
  Administered 2015-11-05: 0.5 mg via INTRAVENOUS
  Filled 2015-11-05: qty 1

## 2015-11-05 MED ORDER — MORPHINE SULFATE (PF) 4 MG/ML IV SOLN
4.0000 mg | Freq: Once | INTRAVENOUS | Status: AC
  Administered 2015-11-05: 20:00:00 4 mg via INTRAVENOUS
  Filled 2015-11-05: qty 1

## 2015-11-05 MED ORDER — DEXTROSE 50 % IV SOLN
INTRAVENOUS | Status: AC
  Administered 2015-11-05: 50 mL
  Filled 2015-11-05: qty 50

## 2015-11-05 SURGICAL SUPPLY — 40 items
BALLN COYOTE OTW 2X60X150 (BALLOONS) ×4
BALLN LUTONIX DCB 5X40X130 (BALLOONS) ×4
BALLOON COYOTE OTW 2X60X150 (BALLOONS) ×2 IMPLANT
BALLOON LUTONIX DCB 5X40X130 (BALLOONS) ×2 IMPLANT
CATH CROSS OVER TEMPO 5F (CATHETERS) ×4 IMPLANT
CATH CXI 4F 150 ST (CATHETERS) ×4 IMPLANT
CATH CXI SUPP ST 4FR 90CM (MICROCATHETER) ×4 IMPLANT
CATH OMNI FLUSH 5F 65CM (CATHETERS) ×4 IMPLANT
CATH SOFT-VU 4F 65 STRAIGHT (CATHETERS) ×2 IMPLANT
CATH SOFT-VU ST 4F 90CM (CATHETERS) ×8 IMPLANT
CATH SOFT-VU STRAIGHT 4F 65CM (CATHETERS) ×2
COVER PRB 48X5XTLSCP FOLD TPE (BAG) ×2 IMPLANT
COVER PROBE 5X48 (BAG) ×2
DEVICE CONTINUOUS FLUSH (MISCELLANEOUS) ×4 IMPLANT
DEVICE TORQUE .025-.038 (MISCELLANEOUS) ×4 IMPLANT
DIAMONDBACK SOLID OAS 1.5MM (CATHETERS) ×4
FILTER CO2 0.2 MICRON (VASCULAR PRODUCTS) ×4 IMPLANT
GUIDEWIRE ANGLED .035X260CM (WIRE) ×8 IMPLANT
GUIDEWIRE STR TIP .014X300X8 (WIRE) ×8 IMPLANT
KIT ENCORE 26 ADVANTAGE (KITS) ×4 IMPLANT
KIT MICROINTRODUCER STIFF 5F (SHEATH) ×4 IMPLANT
KIT PV (KITS) ×4 IMPLANT
LUBRICANT VIPERSLIDE CORONARY (MISCELLANEOUS) ×4 IMPLANT
RESERVOIR CO2 (VASCULAR PRODUCTS) ×4 IMPLANT
SET FLUSH CO2 (MISCELLANEOUS) ×4 IMPLANT
SHEATH PINNACLE 5F 10CM (SHEATH) ×8 IMPLANT
SHEATH PINNACLE 7F 10CM (SHEATH) ×4 IMPLANT
SHEATH PINNACLE ST 7F 65CM (SHEATH) ×4 IMPLANT
STOPCOCK MORSE 400PSI 3WAY (MISCELLANEOUS) ×4 IMPLANT
SYRINGE MEDRAD AVANTA MACH 7 (SYRINGE) ×4 IMPLANT
SYSTEM DIMNDBCK SLD OAS 1.5MM (CATHETERS) ×2 IMPLANT
TRANSDUCER W/STOPCOCK (MISCELLANEOUS) ×4 IMPLANT
TRAY PV CATH (CUSTOM PROCEDURE TRAY) ×4 IMPLANT
TUBING CIL FLEX 10 FLL-RA (TUBING) ×4 IMPLANT
WIRE AMPLATZ SSTIFF .035X260CM (WIRE) ×4 IMPLANT
WIRE BENTSON .035X145CM (WIRE) ×4 IMPLANT
WIRE HITORQ VERSACORE ST 145CM (WIRE) ×4 IMPLANT
WIRE ROSEN-J .035X260CM (WIRE) ×4 IMPLANT
WIRE SPARTACORE .014X300CM (WIRE) ×4 IMPLANT
WIRE VIPER ADVANCE .017X335CM (WIRE) ×4 IMPLANT

## 2015-11-05 NOTE — Progress Notes (Signed)
Discussed with family the possibility of left great toe amputation tomorrow.  NPO after midnight   Wells Brabham

## 2015-11-05 NOTE — H&P (View-Only) (Signed)
[facility]  Patient name: Mark [name] MRN: [id number] DOB: [birth year] Sex: male  REASON FOR CONSULT: gangrene left great toe  HPI: Mark [name] is a 75 y.o. male, who presents with a gangrenous left great toe after bumping it six weeks ago. He states that his toe has progressively worsened. He says the redness in his foot has been present since hitting his toe. He denies any fever or chills. The toe is tender to touch only. The patient has diabetic neuropathy with diminished sensation to his feet. He is minimally ambulatory and only walks around his house with a cane or walker. He states it has been hard for him to get motivated and walk since his wife passed.   He does not ambulate enough to elicit claudication. He denies a prior history of non healing wounds. He is s/p renal transplant. He is on coumadin for atrial fibrillation. He was diabetes managed on an insulin pump. His hypertension is managed on a beta blocker. He has never been a smoker.   Past Medical History:  Diagnosis Date  . Atrial fibrillation ([place])   . Diabetes ([place])   . Gout   . Hypertension   . Hypothyroidism   . Renal disorder    kidney transplant   . Ulcer of other part of foot 12/15/2013    Family History  Problem Relation Age of Onset  . Bradycardia Mother   . Stroke Father     SOCIAL HISTORY: Social History   Social History  . Marital status: Widowed    Spouse name: N/A  . Number of children: N/A  . Years of education: N/A   Occupational History  . Not on file.   Social History Main Topics  . Smoking status: Never Smoker  . Smokeless tobacco: Never Used  . Alcohol use No  . Drug use: No  . Sexual activity: Not on file   Other Topics Concern  . Not on file   Social History Narrative  . No narrative on file    Allergies  Allergen Reactions  . Tape Other (See Comments)    SKIN WILL TEAR!!    Current Facility-Administered Medications    Medication Dose Route Frequency Provider Last Rate Last Dose  . 0.9 %  sodium chloride infusion   Intravenous Continuous Reyne Dumas, MD      . acetaminophen (TYLENOL) tablet 500 mg  500 mg Oral Q6H PRN Etta Quill, DO      . allopurinol (ZYLOPRIM) tablet 150 mg  150 mg Oral Daily Etta Quill, DO      . calcitRIOL (ROCALTROL) capsule 0.5 mcg  0.5 mcg Oral Daily Etta Quill, DO      . digoxin (LANOXIN) tablet 0.125 mg  0.125 mg Oral Daily Jared M Gardner, DO      . famotidine (PEPCID) tablet 20 mg  20 mg Oral QHS Mauricia Area, MD      . insulin aspart (novoLOG) injection 0-15 Units  0-15 Units Subcutaneous TID WC Etta Quill, DO      . insulin aspart (novoLOG) injection 4 Units  4 Units Subcutaneous TID WC Etta Quill, DO      . insulin glargine (LANTUS) injection 30 Units  30 Units Subcutaneous QHS Etta Quill, DO   30 Units at 10/29/15 0039  . levothyroxine (SYNTHROID, LEVOTHROID) tablet 137 mcg  137 mcg Oral QAC breakfast Etta Quill, DO      .  magnesium oxide (MAG-OX) tablet 400 mg  400 mg Oral BID Etta Quill, DO   400 mg at 10/29/15 0039  . metoprolol succinate (TOPROL-XL) 24 hr tablet 50 mg  50 mg Oral Daily Etta Quill, DO      . phosphorus (K PHOS NEUTRAL) tablet 250 mg  250 mg Oral BID Etta Quill, DO   250 mg at 10/29/15 0040  . piperacillin-tazobactam (ZOSYN) IVPB 3.375 g  3.375 g Intravenous Q8H Wynell Balloon, RPH 12.5 mL/hr at 10/29/15 0644 3.375 g at 10/29/15 0644  . potassium chloride SA (K-DUR,KLOR-CON) CR tablet 50 mEq  50 mEq Oral BID Etta Quill, DO      . predniSONE (DELTASONE) tablet 5 mg  5 mg Oral Daily Etta Quill, DO      . rosuvastatin (CRESTOR) tablet 10 mg  10 mg Oral QPM Etta Quill, DO      . tacrolimus (PROGRAF) capsule 0.5 mg  0.5 mg Oral BID Jared M Gardner, DO      . vancomycin (VANCOCIN) IVPB 1000 mg/200 mL premix  1,000 mg Intravenous Q24H Wynell Balloon, RPH        REVIEW OF SYSTEMS:  [X]   denotes positive finding, [ ]  denotes negative finding Cardiac  Comments:  Chest pain or chest pressure:    Shortness of breath upon exertion:    Short of breath when lying flat:    Irregular heart rhythm:        Vascular    Pain in calf, thigh, or hip brought on by ambulation:    Pain in feet at night that wakes you up from your sleep:     Blood clot in your veins:    Leg swelling:  x       Pulmonary    Oxygen at home:    Productive cough:     Wheezing:         Neurologic    Sudden weakness in arms or legs:     Sudden numbness in arms or legs:     Sudden onset of difficulty speaking or slurred speech:    Temporary loss of vision in one eye:     Problems with dizziness:         Gastrointestinal    Blood in stool:     Vomited blood:         Genitourinary    Burning when urinating:     Blood in urine:        Psychiatric    Major depression:         Hematologic    Bleeding problems:    Problems with blood clotting too easily:        Skin    Rashes or ulcers:        Constitutional    Fever or chills:      PHYSICAL EXAM: Vitals:   10/28/15 2115 10/29/15 0002 10/29/15 0446 10/29/15 0743  BP: 170/70 (!) 173/85 138/75 129/66  Pulse: 68 74 (!) 58 62  Resp:  18 18 18   Temp:   98.1 F (36.7 C) 97.9 F (36.6 C)  TempSrc:   Oral Oral  SpO2: 100% 100% 98% 98%  Weight:      Height:        GENERAL: The patient is a well-nourished, morbidly obese male, in no acute distress. The vital signs are documented above. CARDIAC: Irregularly irregular, no carotid bruits.  VASCULAR: 2+ radial and femoral pulses. Non palpable  popliteal pulses. Monophasic left DP and PT. Biphasic right DP and PT. Dry gangrene left great toe. Malodorous. No drainage. Erythema of left foot stopping just before ankle.  PULMONARY: There is good air exchange bilaterally without wheezing or rales. ABDOMEN: Soft and non-tender with normal pitched bowel sounds.  MUSCULOSKELETAL: 2+ pitting edema  bilaterally. Left leg is more swollen than right.  NEUROLOGIC: Diminished sensation to feet bilaterally.  SKIN: See vascular section.  PSYCHIATRIC: The patient has a normal affect.  DATA:    RIGHT   LEFT    PRESSURE WAVEFORM  PRESSURE WAVEFORM  BRACHIAL 188 Normal BRACHIAL 166 Normal  DP 254/[place] biphasic DP 254/[place] Biphasic  PT 202/[place] Biphasic PT Not obtained Bi/monophas  PER   PER    GREAT TOE 56 NA GREAT TOE  NA    RIGHT LEFT  ABI 1.0 NA    MEDICAL ISSUES:  Dry gangrene left great toe Left foot cellulitis  The patient will need an arteriogram to evaluate blood flow to his left foot. Given his hx of renal transplant and elevated creatinine of 2.30, will likely need to do study with C02. Continue antibiotics for left great toe infection and cellulitis. Dr. Trula Slade to see patient.   Virgina Jock, PA-C [facility] [place]     I agree with the above.  The patient has a limb theatening wound on his left great toe.  Vascular lab studies suggest significant arterial insufficiency.  Situation complicated by renal transplant with elevated creatinine.  In addition, he is on coumadin for AFIB.  I would recommend stopping his coumadin and letting his INR trend down. Continue with IV hydration with plans for angiogram later this week.  He will most likely require a second angiogram if an intervention can be performed.  I would space out the 2 procedures to minimize contrast utilization.  At some point after revascularization, he will need a toe amputation   Annamarie Major

## 2015-11-05 NOTE — Op Note (Signed)
Patient name: Mark [name] MRN: [id number] DOB: Dec 16, 1940 Sex: male  10/28/2015 - 11/05/2015 Pre-operative Diagnosis: Left great toe ulcer Post-operative diagnosis:  Same Surgeon:  Servando Snare Co-Surgeon:  Annamarie Major Procedure Performed:  1.  Ultrasound-guided access, right femoral artery  2.  Abdominal aortogram with CO2  3.  Left lower extremity runoff  4.  Angioplasty, left anterior tibial artery  5.  Atherectomy and drug coated balloon angioplasty, left popliteal artery   Indications:  Patient has a history of renal transplant with a baseline creatinine of 1.8-2.3.  He is also on anticoagulation.  He presented with worsening changes to his left great toe.  He comes in today for further arterial evaluation and possible intervention.  Procedure:  The patient was identified in the holding area and taken to room 8.  The patient was then placed supine on the table and prepped and draped in the usual sterile fashion.  A time out was called.  Ultrasound was used to evaluate the right common femoral artery.  It was patent .  A digital ultrasound image was acquired.  A micropuncture needle was used to access the right common femoral artery under ultrasound guidance.  An 018 wire was advanced without resistance and a micropuncture sheath was placed.  The 018 wire was removed and a benson wire was placed.  The micropuncture sheath was exchanged for a 5 french sheath.  An omniflush catheter was advanced over the wire to the level of L-1.  An abdominal angiogram was obtained with CO2.  Next, using the omniflush catheter and a benson wire, the aortic bifurcation was crossed and the catheter was placed into theleft external iliac artery and left runoff was obtained with a combination of CO2 and limited contrast  Findings:   Aortogram:  No significant renal artery stenosis was identified.  The infrarenal abdominal aorta is widely patent.  Bilateral common and external iliac arteries are widely  patent.  Right Lower Extremity:  Not evaluated  Left Lower Extremity:  The left common femoral profunda femoral and superficial femoral artery are widely patent.  Within the popliteal artery at the level of the patella there is a calcified lesion proximally 70-80 percent.  The below knee popliteal artery is widely patent.  There is two-vessel runoff via the peroneal and anterior tibial artery.  There is a short segment occlusion of the anterior tibial artery at the level of the ankle.  The anterior tibial artery reconstitutes across the ankle.  Intervention:  After the above images were acquired the decision was made to proceed with intervention.  Over a Rosen wire, a 7 French 65 cm sheath was advanced into the left superficial femoral artery.  The patient was fully heparinized.  Next using a V14 and an angled CXI, the lesion in the anterior tibial artery was successfully crossed.  An injection was performed through the CXI catheter confirming successful crossing of the lesion.  A [place] core wire was then inserted.  Balloon angioplasty was performed using a 2 x 60 Coyote balloon.  This was taken to nominal pressure for 2 minutes.  Follow-up imaging revealed widely patent intervention site with stenosis less than 10%.  Next the decision was made to intervene on the popliteal lesion at the level of the patella.  A CXI catheter was used for a wire exchange.  A Viper wire was inserted.  The CSI 1.5 classic device was used to perform atherectomy at low medium and high speeds.  Next, drug coated  balloon angioplasty was performed using a 5 x 40 Lutonix drug coated balloon.  This was held up for 2 minutes.  Completion imaging revealed excellent result with residual stenosis approximately 15%.  There was in-line flow across the foot on follow-up imaging.  At this point, the decision was made to terminate the procedure.  Catheters and wires were removed.  The patient be taken the holding area for sheath pull once his  coag profile corrects.  Impression:  #1  successful atherectomy and drug coated balloon angioplasty of an 80% calcified left popliteal artery stenosis.  A CSI 1.5 classic device was used with a 5 x 40 drug coated Lutonix balloon with residual stenosis approximately 15%.  #2  occluded distal left anterior tibial artery at the level of the ankle for approximately 2 cm.  This was successfully crossed and primary balloon angioplasty was performed using a 2 mm balloon with residual stenosis less than 10%    V. Annamarie Major, M.D. [facility] Office: [phone] Pager:  [phone]

## 2015-11-05 NOTE — Interval H&P Note (Signed)
History and Physical Interval Note:  11/05/2015 12:53 PM  Mark [name]  has presented today for surgery, with the diagnosis of grangreen great left toe  The various methods of treatment have been discussed with the patient and family. After consideration of risks, benefits and other options for treatment, the patient has consented to  Procedure(s): Abdominal Aortogram w/Lower Extremity (N/A) as a surgical intervention .  The patient's history has been reviewed, patient examined, no change in status, stable for surgery.  I have reviewed the patient's chart and labs.  Questions were answered to the patient's satisfaction.     Annamarie Major

## 2015-11-05 NOTE — Progress Notes (Signed)
Site area: right groin  Site Prior to Removal:  Level 2  Pressure Applied For 25 MINUTES    Minutes Beginning at 2035  Manual:   Yes.    Patient Status During Pull:  stable  Post Pull Groin Site:  Level 1  Post Pull Instructions Given:  Yes.    Post Pull Pulses Present:  Yes.  via doppler; as previously documented  Dressing Applied:  Yes.    Comments:  Pt tolerated welll; however is unable to follow commands safely; sitter at bedside for patients physical safety

## 2015-11-05 NOTE — Progress Notes (Addendum)
Patient ID: Mark [name], male   DOB: 1940/08/16, 75 y.o.   MRN: [id number]                                                                PROGRESS NOTE                                                                                                                                                                                                             Patient Demographics:    Mark [name], is a 75 y.o. male, DOB - Sep 26, 1940, [id number]  Admit date - 10/28/2015   Admitting Physician Etta Quill, DO  Outpatient Primary MD for the patient is Rochel Brome, MD  LOS - 8  Outpatient Specialists:   Chief Complaint  Patient presents with  . 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 Patient presents to ED with c/o 'his toe is dead'. This onset about 1 month ago. Finally saw doctor today after he fell getting out of the shower this morning. His doctor was concerned about infection and sent him to the ED. No fever no vomiting.Toe found to begrossly infected and necrotic . 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)   Cellulitis of great toe of left foot   Shortness of breath Likely secondary to pulmonary vascular congestion Chest x-ray pending, patient noted to be tachypneic but not particularly hypoxic or tachycardic Trial of Lasix, nebulizer treatments, discontinue IV fluids Patient anticipated to have surgery today, his symptoms have resolved  patient okay to proceed with surgery Troponin slightly elevated at 0.09 by 2-D echo did not show any wall motion abnormalities and the patient does not appear to have any  ischemic changes on his EKG Cardiology already on board and following  Gangrene of left  toe - Zosyn / vanc - 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 vascular surgery following .ABI shows no blood flow to the left toe,  Dr. Starr Sinclair:   patient will need an arteriogram to evaluate blood flow to his left foot.Situation complicated by renal transplant with elevated creatinine, conton gentle IV fluids to prevent renal failure, Waiting for INR to trend down. angiogram scheduled for 8/1,     According to [facility] and AHA guidelines and cardiology, he requires no further cardiac workup prior to his noncardiac surgery and should be at acceptable risk   Chronic kidney disease stage IV, status post renal transplant , baseline creatinine around 1.7 creatinine has been trending up over the past year, but improved since admission, continue gentle IV hydration, follow creatinine closely. Creatinine has been stable for several days. Nephrology consulted to monitor patient's renal function in the setting of upcoming procedure/angiogram.Continue anti-rejection meds, appreciate [name] following along during this hospitalization, Prograf level was low and [name] increased prograf, appreciate his input   A fib- Holding coumadin, heparin per pharmacy, rate controlled , preop 2-D echo shows grade 2 diastolic dysfunction, pulmonary hypertension, EKG/telemetry shows A. fib rate controlled, cont digoxin, metoprolol, Cardiology consulted for preop clearance , pt has been cleared by cardiology for surgery   DM2 -continueLantus reduced dose to 20 units, contpre-meal NovoLog 5 units.Moderate dose mealtime and SSI AC/HS,  start low-dose D10 as the patient is NPO Hemoglobin A1c 8.2  Hypertension: uncontrolled Continue norvasc to 10mg  po qday , metoprolol Prn hydralazine  Hypothyroidism-continue Synthroid  Protein Calorie Malnutrition prostat  Dyslipidemia-continue Crestor  Pulmonary hypertension:  [name] has moderate pulmonary hypertension.  Recommend outpatient sleep study and PFTs.  This may also be due to left-sided heart disease. He does have grade 2 diastolic dysfunction. Recommend diuresis with stable per nephrology.  There is no evidence of liver disease by laboratory testing   Code Status : FULL CODE  Family Communication  :   Disposition Plan  :  ? Rehab vs home  Barriers For Discharge :   Consults  :  Vascular surgery, nephrology, cardiology  Procedures  :   DVT Prophylaxis  :  Heparin     Lab Results  Component Value Date   PLT 329 11/05/2015    Antibiotics  :    Anti-infectives    Start     Dose/Rate Route Frequency Ordered Stop   10/29/15 2200  vancomycin (VANCOCIN) IVPB 1000 mg/200 mL premix     1,000 mg 200 mL/hr over 60 Minutes Intravenous Every 24 hours 10/28/15 2154     10/28/15 2200  piperacillin-tazobactam (ZOSYN) IVPB 3.375 g     3.375 g 12.5 mL/hr over 240 Minutes Intravenous Every 8 hours 10/28/15 2120     10/28/15 2200  vancomycin (VANCOCIN) 2,500 mg in sodium chloride 0.9 % 500 mL IVPB     2,500 mg 250 mL/hr over 120 Minutes Intravenous  Once 10/28/15 2154 10/29/15 2330        Objective:   Vitals:   11/04/15 2042 11/05/15 0456 11/05/15 0801 11/05/15 0846  BP: (!) 173/95 140/78 (!) 163/97 (!) 191/57  Pulse: 65 71 70   Resp: 19 18 (!) 21   Temp: 97.6 F (36.4 C) 97.5 F (36.4 C) 98 F (36.7 C)   TempSrc: Oral Oral Oral  SpO2: 98% 99% 99%   Weight: 110.5 kg (243 lb 9.7 oz)     Height:        Wt Readings from Last 3 Encounters:  11/04/15 110.5 kg (243 lb 9.7 oz)  09/05/15 103.1 kg (227 lb 3.2 oz)  08/05/15 101.8 kg (224 lb 6.4 oz)       Intake/Output Summary (Last 24 hours) at 11/05/15 0906 Last data filed at 11/05/15 0802  Gross per 24 hour  Intake              480 ml  Output              800 ml  Net             -320 ml     Physical Exam  Awake Alert, Oriented X 3, No new F.N deficits, Normal affect [place].AT,PERRAL Supple Neck,No JVD, No cervical lymphadenopathy appriciated.  Symmetrical Chest wall movement, Good air movement bilaterally, CTAB RRR,No Gallops,Rubs or new Murmurs, No Parasternal Heave +ve B.Sounds, Abd Soft, No tenderness, No organomegaly appriciated, No rebound - guarding or rigidity. No edema.  1st left foot black.    Data Review:    CBC  Recent Labs Lab 11/01/15 0628 11/02/15 0257 11/03/15 0450 11/04/15 0443 11/05/15 0532  WBC 11.6* 9.6 10.0 10.7* 12.3*  HGB 14.1 13.5 13.3 13.8 14.2  HCT 41.9 40.7 40.9 43.4 43.4  PLT 299 287 297 335 329  MCV 91.9 93.1 93.4 95.2 95.2  MCH 30.9 30.9 30.4 30.3 31.1  MCHC 33.7 33.2 32.5 31.8 32.7  RDW 14.3 14.3 14.6 15.0 15.2    Chemistries   Recent Labs Lab 10/29/15 1233  11/01/15 0628 11/02/15 0257 11/03/15 0450 11/04/15 0443 11/05/15 0532  NA  --   < > 140 136 140 139 140  K  --   < > 3.7 3.5 3.8 4.0 4.8  CL  --   < > 106 105 114* 113* 113*  CO2  --   < > 24 25 22  21* 20*  GLUCOSE  --   < > 134* 119* 108* 156* 86  BUN  --   < > 23* 29* 29* 30* 27*  CREATININE  --   < > 1.72* 1.89* 1.88* 1.99* 1.88*  CALCIUM  --   < > 8.9 8.4* 8.6* 8.8* 9.0  MG 1.9  --   --  2.1  --   --   --   AST  --   < > 21 19 21 26  40  ALT  --   < > 12* 14* 14* 16* 21  ALKPHOS  --   < > 58 57 67 88 102  BILITOT  --   < > 1.3* 0.9 1.1 1.0 1.3*  < > = values in this interval not displayed. ------------------------------------------------------------------------------------------------------------------ No results for input(s): CHOL, HDL, LDLCALC, TRIG, CHOLHDL, LDLDIRECT in the last 72 hours.  Lab Results  Component Value Date   HGBA1C 8.2 (H) 10/29/2015    ------------------------------------------------------------------------------------------------------------------ No results for input(s): TSH, T4TOTAL, T3FREE, THYROIDAB in the last 72 hours.  Invalid input(s): FREET3 ------------------------------------------------------------------------------------------------------------------ No results for input(s): VITAMINB12, FOLATE, FERRITIN, TIBC, IRON, RETICCTPCT in the last 72 hours.  Coagulation profile  Recent Labs Lab 11/01/15 0628 11/02/15 0817 11/03/15 0450 11/04/15 0443 11/05/15 0532  INR 1.97 1.90 1.87 1.64 1.54    No results for input(s): DDIMER in the last 72 hours.  Cardiac Enzymes No results for input(s): CKMB, TROPONINI, MYOGLOBIN in the last 168 hours.  Invalid input(s): CK ------------------------------------------------------------------------------------------------------------------ No results found for: BNP  Inpatient Medications  Scheduled Meds: . allopurinol  150 mg Oral Daily  . amLODipine  10 mg Oral Daily  . calcitRIOL  0.5 mcg Oral Daily  . digoxin  0.125 mg Oral Daily  . famotidine  20 mg Oral QHS  . feeding supplement (GLUCERNA SHAKE)  237 mL Oral Q1500  . feeding supplement (PRO-STAT SUGAR FREE 64)  30 mL Oral BID BM  . furosemide  60 mg Intravenous Once  . insulin aspart  0-15 Units Subcutaneous TID WC  . insulin aspart  5 Units Subcutaneous TID WC  . insulin glargine  36 Units Subcutaneous QHS  . levalbuterol  1.25 mg Nebulization Q6H  . levothyroxine  137 mcg Oral QAC breakfast  . magnesium oxide  400 mg Oral BID  . metoprolol succinate  100 mg Oral Daily  . phosphorus  250 mg Oral BID  . piperacillin-tazobactam (ZOSYN)  IV  3.375 g Intravenous Q8H  . potassium chloride  20 mEq Oral BID  . predniSONE  5 mg Oral Daily  . rosuvastatin  10 mg Oral QPM  . tacrolimus  1 mg Oral BID  . vancomycin  1,000 mg Intravenous Q24H   Continuous Infusions: . heparin 1,500 Units/hr (11/04/15 1735)    PRN Meds:.acetaminophen, hydrALAZINE, ondansetron (ZOFRAN) IV  Micro Results Recent Results (from the past 240 hour(s))  Blood Cultures x 2 sites     Status: None   Collection Time: 10/28/15 10:00 PM  Result Value Ref Range Status   Specimen Description BLOOD RIGHT FOREARM  Final   Special Requests BOTTLES DRAWN AEROBIC AND ANAEROBIC 4CC  Final   Culture NO GROWTH 5 DAYS  Final   Report Status 11/02/2015 FINAL  Final  Blood Cultures x 2 sites     Status: None   Collection Time: 10/28/15 11:04 PM  Result Value Ref Range Status   Specimen Description BLOOD LEFT ARM  Final   Special Requests AEROBIC BOTTLE ONLY 7ML  Final   Culture NO GROWTH 5 DAYS  Final   Report Status 11/02/2015 FINAL  Final  C difficile quick scan w PCR reflex     Status: None   Collection Time: 11/04/15  9:42 AM  Result Value Ref Range Status   C Diff antigen NEGATIVE NEGATIVE Final   C Diff toxin NEGATIVE NEGATIVE Final   C Diff interpretation No C. difficile detected.  Final    Radiology Reports Dg Foot Complete Left  Result Date: 10/28/2015 CLINICAL DATA:  Left big toe pain. EXAM: LEFT FOOT - COMPLETE 3+ VIEW COMPARISON:  None. FINDINGS: There is no evidence of fracture or dislocation. No evidence of dystrophic bony changes. There is soft tissue swelling of the distal first digit. Punctate lucencies within the area of soft tissue swelling may represent entrapped fat or potentially foci of gas. Heavy vascular calcifications are seen. IMPRESSION: No evidence of fracture of the left foot. Soft tissue swelling of the distal first digit with punctate lucencies within the area of swelling, which may represent entrapped fat or potentially foci of gas. Please correlate clinically regarding the possibility of cellulitis. Electronically Signed   By: Fidela Salisbury M.D.   On: 10/28/2015 21:01   Time Spent in minutes  54   Maleni Seyer M.D on 11/05/2015 at 9:06 AM  Between 7am to 7pm - Pager -  [phone]  After 7pm go to [email or web address] - password [facility]  [facility] -  Office  [phone]

## 2015-11-05 NOTE — Progress Notes (Signed)
Transfer Note Patient transferred to 6C03 from IR cath lab. Report given to [phone] nurse, questions answered  Mark [name]

## 2015-11-05 NOTE — Progress Notes (Addendum)
ANTICOAGULATION CONSULT NOTE - Follow Up Consult  Pharmacy Consult for heparin Indication: atrial fibrillation  Labs:  Recent Labs  11/03/15 0450 11/04/15 0443 11/05/15 0532 11/05/15 0856  HGB 13.3 13.8 14.2  --   HCT 40.9 43.4 43.4  --   PLT 297 335 329  --   LABPROT 21.8* 19.6* 18.6*  --   INR 1.87 1.64 1.54  --   HEPARINUNFRC 0.54 0.71* 0.47  --   CREATININE 1.88* 1.99* 1.88*  --   TROPONINI  --   --   --  0.09*     Assessment: 75yo male on IV heparin drip bridge while off coumadin for h/o Afib.  Now s/p arteriogram- successful atherectomy and drug coated balloon angioplasty of an 80% calcified left popliteal artery stenosis and  balloon angioplasty of occluded distal left anterior tibial artery.  Pharmacy to restart Heparin IV 6 hours after sheath removed (no bolus).  Sheath sutured in place , no bleeding  & no hematoma.  VVS dc'd heparin drip now @16 :27 then check ACT every 30 minutes after heparin discontinued.  Discontinue sheath when ACT < 175 F/u when RN removes sheath, then will restart  IV heparin at previous rate 1500 units/hr and check 6 h HL.      Goal of Therapy:  Heparin level 0.3-0.7 units/ml   Plan:  F/u when sheath removed. Then will restart IV heparin 6 hours post sheath at previous rate 1500 units/hr and check 6 h HL. Daily heparin level and CBC.   Nicole Cella, RPh Clinical Pharmacist Pager: [phone] 11/05/2015,5:03 PM   ADDENDUM:   ACT = 175 @ 1813.  RN reports sheath removed and holding pressure on right groin now.  Sheath removal time per RN 21:00.   Plan :  If no bleeding or hematoma, will restart IV heparin drip (no bolus) 6 hours after sheath removed at 1500 units/hr (@ 3AM)  Check HL 6 hrs post restart Daily heparin level and CBC  Nicole Cella, RPh Clinical Pharmacist Pager: [phone] 11/05/2015 9:01 PM

## 2015-11-05 NOTE — Progress Notes (Signed)
Occupational Therapy Treatment Patient Details Name: Mark [name] MRN: [id number] DOB: 09-12-1940 Today's Date: 11/05/2015    History of present illness Kyheem Boulos Hackettis a 75 y.o.malewith medical history significant of HTN, DM2, ESRD s/p renal transplant 12 years ago , A.Fib on coumadin. Pt admitted with gangrene of L toe.   OT comments  Pt making progress, however required much encouragement to participate in OT session today and required increased time to initiate and complete tasks. Pt able to use RW to ambulate to bathroom for toileting and stand at sink for grooming and LB ADLs with assist. Pt with slow pace using RW and SOB after completing tasks. Pt returned to recliner and O2 SATs at 87%, however pt recovered to 92% less than 1 minute when instructed with deep breathing technique. OT will continue to follow  Follow Up Recommendations  SNF;Supervision/Assistance - 24 hour    Equipment Recommendations   (TBD at next level of care)    Recommendations for Other Services      Precautions / Restrictions Precautions Precautions: Fall Restrictions Weight Bearing Restrictions: No       Mobility Bed Mobility               General bed mobility comments: pt up in recliner  Transfers                      Balance     Sitting balance-Leahy Scale: Good     Standing balance support: During functional activity Standing balance-Leahy Scale: Fair                     ADL Overall ADL's : Needs assistance/impaired     Grooming: Wash/dry hands;Wash/dry face;Standing;Min guard       Lower Body Bathing: Minimal assistance;Sit to/from stand;Cueing for safety       Lower Body Dressing: Moderate assistance;Sit to/from stand   Toilet Transfer: Comfort height toilet;Ambulation;RW;Min guard   Toileting- Clothing Manipulation and Hygiene: Sit to/from stand;Min guard       Functional mobility during ADLs: Min guard;Rolling walker         Vision  no change from baseline                              Cognition   Behavior During Therapy: WFL for tasks assessed/performed Overall Cognitive Status: Impaired/Different from baseline Area of Impairment: Safety/judgement          Safety/Judgement: Decreased awareness of safety          Extremity/Trunk Assessment   generalized weakness                        General Comments  pt pleasant, talkative    Pertinent Vitals/ Pain       Pain Assessment: 0-10 Pain Score: 3  Pain Location: L foot Pain Descriptors / Indicators: Sore Pain Intervention(s): Monitored during session;Premedicated before session;Repositioned  Home Living  daughter lives with him                                        Prior Functioning/Environment  independent           Frequency Min 2X/week     Progress Toward Goals  OT Goals(current goals can now be found in the care plan section)  Progress towards OT goals: Progressing toward goals     Plan Discharge plan remains appropriate                     End of Session Equipment Utilized During Treatment: Gait belt;Rolling walker   Activity Tolerance Patient tolerated treatment well   Patient Left with call bell/phone within reach;in chair   Nurse Communication Mobility status        Time: [id number] OT Time Calculation (min): 35 min  Charges: OT General Charges $OT Visit: 1 Procedure OT Treatments $Self Care/Home Management : 8-22 mins $Therapeutic Activity: 8-22 mins  Britt Bottom 11/05/2015, 1:18 PM

## 2015-11-05 NOTE — Progress Notes (Signed)
Physical Therapy Cancellation Note   11/05/15 1305  PT Visit Information  Last PT Received On 11/05/15  Reason Eval/Treat Not Completed Patient at procedure or test/unavailable; Pt in OR; PT will check on pt later as time allows.   History of Present Illness Mark [name] Hackettis a 75 y.o.malewith medical history significant of HTN, DM2, ESRD s/p renal transplant 12 years ago , A.Fib on coumadin. Pt admitted with gangrene of L toe.  Mark [name], PTA Pager: [phone]

## 2015-11-06 ENCOUNTER — Encounter (HOSPITAL_COMMUNITY)
Admission: EM | Disposition: A | Discharge status: Skilled Nursing Facility | Payer: Self-pay | Source: Home / Self Care | Attending: Internal Medicine

## 2015-11-06 ENCOUNTER — Inpatient Hospital Stay (HOSPITAL_COMMUNITY): Payer: Medicare Other | Admitting: Certified Registered Nurse Anesthetist

## 2015-11-06 ENCOUNTER — Encounter (HOSPITAL_COMMUNITY): Payer: Self-pay | Admitting: Surgery

## 2015-11-06 ENCOUNTER — Inpatient Hospital Stay (HOSPITAL_COMMUNITY): Payer: Medicare Other

## 2015-11-06 ENCOUNTER — Encounter (HOSPITAL_COMMUNITY): Payer: Self-pay | Admitting: Certified Registered Nurse Anesthetist

## 2015-11-06 DIAGNOSIS — I482 Chronic atrial fibrillation: Secondary | ICD-10-CM

## 2015-11-06 DIAGNOSIS — T17998A Other foreign object in respiratory tract, part unspecified causing other injury, initial encounter: Secondary | ICD-10-CM

## 2015-11-06 DIAGNOSIS — R0602 Shortness of breath: Secondary | ICD-10-CM

## 2015-11-06 DIAGNOSIS — I4821 Permanent atrial fibrillation: Secondary | ICD-10-CM

## 2015-11-06 HISTORY — PX: AMPUTATION: SHX166

## 2015-11-06 LAB — GLUCOSE, CAPILLARY
GLUCOSE-CAPILLARY: 80 mg/dL (ref 65–99)
GLUCOSE-CAPILLARY: 94 mg/dL (ref 65–99)
GLUCOSE-CAPILLARY: 187 mg/dL — AB (ref 65–99)
Glucose-Capillary: 78 mg/dL (ref 65–99)
Glucose-Capillary: 96 mg/dL (ref 65–99)
Glucose-Capillary: 94 mg/dL (ref 65–99)
Glucose-Capillary: 90 mg/dL (ref 65–99)

## 2015-11-06 LAB — COMPREHENSIVE METABOLIC PANEL
ALBUMIN: 1.9 g/dL — AB (ref 3.5–5.0)
ALT: 18 U/L (ref 17–63)
AST: 26 U/L (ref 15–41)
Alkaline Phosphatase: 85 U/L (ref 38–126)
Anion gap: 6 (ref 5–15)
BUN: 27 mg/dL — AB (ref 6–20)
CHLORIDE: 113 mmol/L — AB (ref 101–111)
CO2: 22 mmol/L (ref 22–32)
CREATININE: 2.33 mg/dL — AB (ref 0.61–1.24)
Calcium: 8.5 mg/dL — ABNORMAL LOW (ref 8.9–10.3)
GFR calc Af Amer: 30 mL/min — ABNORMAL LOW (ref >60)
GFR calc non Af Amer: 26 mL/min — ABNORMAL LOW (ref >60)
GLUCOSE: 83 mg/dL (ref 65–99)
Potassium: 4.3 mmol/L (ref 3.5–5.1)
SODIUM: 141 mmol/L (ref 135–145)
Total Bilirubin: 0.8 mg/dL (ref 0.3–1.2)
Total Protein: 5.3 g/dL — ABNORMAL LOW (ref 6.5–8.1)

## 2015-11-06 LAB — ANTINUCLEAR ANTIBODIES, IFA: ANTINUCLEAR ANTIBODIES, IFA: POSITIVE — AB

## 2015-11-06 LAB — TROPONIN I
Troponin I: 0.06 ng/mL (ref <0.03)
Troponin I: 0.07 ng/mL (ref <0.03)

## 2015-11-06 LAB — PROTIME-INR
INR: 1.51
Prothrombin Time: 18.4 seconds — ABNORMAL HIGH (ref 11.4–15.2)

## 2015-11-06 LAB — CBC
HEMATOCRIT: 40 % (ref 39.0–52.0)
HEMOGLOBIN: 13 g/dL (ref 13.0–17.0)
MCH: 31.3 pg (ref 26.0–34.0)
MCHC: 32.5 g/dL (ref 30.0–36.0)
MCV: 96.4 fL (ref 78.0–100.0)
Platelets: 285 10*3/uL (ref 150–400)
RBC: 4.15 MIL/uL — AB (ref 4.22–5.81)
RDW: 15.6 % — ABNORMAL HIGH (ref 11.5–15.5)
WBC: 9.4 10*3/uL (ref 4.0–10.5)

## 2015-11-06 LAB — SURGICAL PCR SCREEN
MRSA, PCR: NEGATIVE
Staphylococcus aureus: POSITIVE — AB

## 2015-11-06 LAB — FANA STAINING PATTERNS: Homogeneous Pattern: 1:320 {titer} — ABNORMAL HIGH

## 2015-11-06 SURGERY — AMPUTATION DIGIT
Anesthesia: General | Site: Foot | Laterality: Left

## 2015-11-06 SURGERY — AMPUTATION DIGIT
Anesthesia: Choice | Laterality: Left

## 2015-11-06 MED ORDER — PROMETHAZINE HCL 25 MG/ML IJ SOLN: 6.2500 mg | INTRAMUSCULAR | Status: DC | PRN

## 2015-11-06 MED ORDER — HEPARIN (PORCINE) IN NACL 100-0.45 UNIT/ML-% IJ SOLN
1500.0000 [IU]/h | INTRAMUSCULAR | Status: DC
  Administered 2015-11-06 – 2015-11-09 (×4): 1500 [IU]/h via INTRAVENOUS
  Filled 2015-11-06 (×5): qty 250

## 2015-11-06 MED ORDER — LIDOCAINE HCL (CARDIAC) 20 MG/ML IV SOLN
INTRAVENOUS | Status: DC | PRN
  Administered 2015-11-06: 70 mg via INTRAVENOUS

## 2015-11-06 MED ORDER — ONDANSETRON HCL 4 MG/2ML IJ SOLN
INTRAMUSCULAR | Status: DC | PRN
  Administered 2015-11-06: 4 mg via INTRAVENOUS

## 2015-11-06 MED ORDER — FUROSEMIDE 10 MG/ML IJ SOLN
40.0000 mg | Freq: Once | INTRAMUSCULAR | Status: AC
  Administered 2015-11-06: 40 mg via INTRAVENOUS
  Filled 2015-11-06: qty 4

## 2015-11-06 MED ORDER — HYDROMORPHONE HCL 1 MG/ML IJ SOLN: 0.2500 mg | INTRAMUSCULAR | Status: DC | PRN

## 2015-11-06 MED ORDER — SODIUM CHLORIDE 0.9 % IV SOLN
INTRAVENOUS | Status: DC | PRN
  Administered 2015-11-06: 13:00:00 via INTRAVENOUS

## 2015-11-06 MED ORDER — MORPHINE SULFATE (PF) 2 MG/ML IV SOLN
1.0000 mg | Freq: Once | INTRAVENOUS | Status: AC
  Administered 2015-11-06: 1 mg via INTRAVENOUS
  Filled 2015-11-06: qty 1

## 2015-11-06 MED ORDER — MORPHINE SULFATE (PF) 2 MG/ML IV SOLN: 2.0000 mg | Freq: Once | INTRAVENOUS | Status: DC

## 2015-11-06 MED ORDER — PROPOFOL 10 MG/ML IV BOLUS
INTRAVENOUS | Status: AC
  Filled 2015-11-06: qty 20

## 2015-11-06 MED ORDER — ANGIOPLASTY BOOK
Freq: Once | Status: AC
  Administered 2015-11-06: 03:00:00
  Filled 2015-11-06: qty 1

## 2015-11-06 MED ORDER — MUPIROCIN 2 % EX OINT
1.0000 "application " | TOPICAL_OINTMENT | Freq: Two times a day (BID) | CUTANEOUS | Status: AC
  Administered 2015-11-06 – 2015-11-10 (×9): 1 via NASAL
  Filled 2015-11-06 (×3): qty 22

## 2015-11-06 MED ORDER — SODIUM CHLORIDE 0.9 % IV SOLN
INTRAVENOUS | Status: DC
  Administered 2015-11-06: 12:00:00 via INTRAVENOUS

## 2015-11-06 MED ORDER — ASPIRIN 81 MG PO CHEW
81.0000 mg | CHEWABLE_TABLET | Freq: Every day | ORAL | Status: DC
  Administered 2015-11-06 – 2015-11-26 (×21): 81 mg via ORAL
  Filled 2015-11-06 (×21): qty 1

## 2015-11-06 MED ORDER — ASPIRIN EC 81 MG PO TBEC: 81.0000 mg | DELAYED_RELEASE_TABLET | Freq: Every day | ORAL | Status: DC

## 2015-11-06 MED ORDER — FENTANYL CITRATE (PF) 100 MCG/2ML IJ SOLN
INTRAMUSCULAR | Status: DC | PRN
  Administered 2015-11-06: 50 ug via INTRAVENOUS

## 2015-11-06 MED ORDER — CHLORHEXIDINE GLUCONATE CLOTH 2 % EX PADS
6.0000 | MEDICATED_PAD | Freq: Every day | CUTANEOUS | Status: AC
  Administered 2015-11-06 – 2015-11-10 (×5): 6 via TOPICAL

## 2015-11-06 MED ORDER — 0.9 % SODIUM CHLORIDE (POUR BTL) OPTIME
TOPICAL | Status: DC | PRN
  Administered 2015-11-06: 1000 mL

## 2015-11-06 MED ORDER — PROPOFOL 10 MG/ML IV BOLUS
INTRAVENOUS | Status: DC | PRN
  Administered 2015-11-06: 120 mg via INTRAVENOUS

## 2015-11-06 MED ORDER — FENTANYL CITRATE (PF) 250 MCG/5ML IJ SOLN
INTRAMUSCULAR | Status: AC
  Filled 2015-11-06: qty 5

## 2015-11-06 MED ORDER — PROPOFOL 1000 MG/100ML IV EMUL
INTRAVENOUS | Status: AC
  Filled 2015-11-06: qty 100

## 2015-11-06 SURGICAL SUPPLY — 33 items
BANDAGE ACE 4X5 VEL STRL LF (GAUZE/BANDAGES/DRESSINGS) ×2 IMPLANT
BANDAGE ELASTIC 4 VELCRO ST LF (GAUZE/BANDAGES/DRESSINGS) ×2 IMPLANT
BNDG GAUZE ELAST 4 BULKY (GAUZE/BANDAGES/DRESSINGS) ×2 IMPLANT
CANISTER SUCTION 2500CC (MISCELLANEOUS) ×2 IMPLANT
CONT SPEC 4OZ CLIKSEAL STRL BL (MISCELLANEOUS) ×2 IMPLANT
COVER SURGICAL LIGHT HANDLE (MISCELLANEOUS) ×2 IMPLANT
DRAPE EXTREMITY T 121X128X90 (DRAPE) ×2 IMPLANT
DRSG ADAPTIC 3X8 NADH LF (GAUZE/BANDAGES/DRESSINGS) ×2 IMPLANT
DRSG EMULSION OIL 3X3 NADH (GAUZE/BANDAGES/DRESSINGS) ×2 IMPLANT
ELECT REM PT RETURN 9FT ADLT (ELECTROSURGICAL) ×2
ELECTRODE REM PT RTRN 9FT ADLT (ELECTROSURGICAL) ×1 IMPLANT
GAUZE SPONGE 2X2 8PLY STRL LF (GAUZE/BANDAGES/DRESSINGS) ×1 IMPLANT
GAUZE SPONGE 4X4 12PLY STRL (GAUZE/BANDAGES/DRESSINGS) ×2 IMPLANT
GLOVE BIO SURGEON STRL SZ7.5 (GLOVE) ×2 IMPLANT
GOWN STRL REUS W/ TWL LRG LVL3 (GOWN DISPOSABLE) ×3 IMPLANT
GOWN STRL REUS W/TWL LRG LVL3 (GOWN DISPOSABLE) ×3
KIT BASIN OR (CUSTOM PROCEDURE TRAY) ×2 IMPLANT
KIT ROOM TURNOVER OR (KITS) ×2 IMPLANT
NS IRRIG 1000ML POUR BTL (IV SOLUTION) ×2 IMPLANT
PACK GENERAL/GYN (CUSTOM PROCEDURE TRAY) ×2 IMPLANT
PAD ARMBOARD 7.5X6 YLW CONV (MISCELLANEOUS) ×4 IMPLANT
SPECIMEN JAR SMALL (MISCELLANEOUS) ×2 IMPLANT
SPONGE GAUZE 2X2 STER 10/PKG (GAUZE/BANDAGES/DRESSINGS) ×1
SPONGE GAUZE 4X4 12PLY STER LF (GAUZE/BANDAGES/DRESSINGS) ×2 IMPLANT
SUT ETHILON 3 0 PS 1 (SUTURE) ×2 IMPLANT
SUT VIC AB 3-0 SH 27 (SUTURE)
SUT VIC AB 3-0 SH 27X BRD (SUTURE) IMPLANT
SWAB COLLECTION DEVICE MRSA (MISCELLANEOUS) IMPLANT
TOWEL OR 17X24 6PK STRL BLUE (TOWEL DISPOSABLE) ×2 IMPLANT
TOWEL OR 17X26 10 PK STRL BLUE (TOWEL DISPOSABLE) ×2 IMPLANT
TUBE ANAEROBIC SPECIMEN COL (MISCELLANEOUS) IMPLANT
UNDERPAD 30X30 INCONTINENT (UNDERPADS AND DIAPERS) ×2 IMPLANT
WATER STERILE IRR 1000ML POUR (IV SOLUTION) ×2 IMPLANT

## 2015-11-06 NOTE — Care Management Note (Signed)
Case Management Note  Patient Details  Name: LANARD ADAMCZYK MRN: [id number] Date of Birth: 09-11-1940  Subjective/Objective:  With afib, chronic stage 3 renal insuff, gangreene necrotic toe, had abd aortogram with angioplasty on 8/1, developed r groin hematoma,heparin held.  For amputation of toe today, per ot eval rec snf.  CSW referral.                   Action/Plan:   Expected Discharge Date:                  Expected Discharge Plan:  [place]  In-House Referral:     Discharge planning Services     Post Acute Care Choice:    Choice offered to:     DME Arranged:    DME Agency:     HH Arranged:    [place] Agency:     Status of Service:  In process, will continue to follow  If discussed at Long Length of Stay Meetings, dates discussed:    Additional Comments:  Zenon Mayo, RN 11/06/2015, 5:31 PM

## 2015-11-06 NOTE — Progress Notes (Signed)
Pharmacy Antibiotic Note Mark [name] is a 75 y.o. male admitted on 10/28/2015 with diabetic foot infection in setting of chronic immunosuppression for renal txp. 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 Continues on Zosyn and therapeutic vancomycin. Potential left great toe amputation today. Currently afebrile, WBC WNL. Increase in SCr today - 1.88>2.33.    7/27 VT = 15    Plan: -Continue Zosyn 3.375 gm IV q8h -Continue Vancomycin 1000 mg q 24h -Increase in SCr today - will monitor for needed adjustments to vancomycin -F/U ABX plans post-surgery  Height: 5\' 9"  (175.3 cm) Weight: 242 lb 8.1 oz (110 kg) IBW/kg (Calculated) : 70.7  Temp (24hrs), Avg:98 F (36.7 C), Min:97.1 F (36.2 C), Max:98.7 F (37.1 C)   Recent Labs Lab 10/31/15 2155  11/02/15 0257 11/03/15 0450 11/04/15 0443 11/05/15 0532 11/06/15 0527  WBC  --   < > 9.6 10.0 10.7* 12.3* 9.4  CREATININE  --   < > 1.89* 1.88* 1.99* 1.88* 2.33*  VANCOTROUGH 15  --   --   --   --   --   --   < > = values in this interval not displayed.  Estimated Creatinine Clearance: 33.5 mL/min (by C-G formula based on SCr of 2.33 mg/dL).    Allergies  Allergen Reactions  . Tape Other (See Comments)    SKIN WILL TEAR!!    Antimicrobials this admission: Zosyn 7/24>> Vancomycin 7/24>>   Microbiology results: 7/24 BCx: ngtd (final) 7/31 Cdiff: neg 8/1: MRSA screen: pos  Thank you for allowing pharmacy to be a part of this patient's care.  Stephens November, PharmD Clinical Pharmacist 7:53 AM, 11/06/2015

## 2015-11-06 NOTE — Progress Notes (Signed)
    Subjective:  Denies CP or dyspnea   Objective:  Vitals:   11/05/15 2300 11/06/15 0000 11/06/15 0100 11/06/15 0317  BP: 140/64 (!) 144/78 (!) 147/64 95/64  Pulse: (!) 59 68 79 77  Resp: (!) 25 (!) 27 15 15   Temp:    98.1 F (36.7 C)  TempSrc:    Oral  SpO2: 93% 93% 93% 93%  Weight:    242 lb 8.1 oz (110 kg)  Height:        Intake/Output from previous day:  Intake/Output Summary (Last 24 hours) at 11/06/15 0839 Last data filed at 11/06/15 0058  Gross per 24 hour  Intake              365 ml  Output              900 ml  Net             -535 ml    Physical Exam: Physical exam: Well-developed obese in no acute distress.  Skin is warm and dry.  HEENT is normal.  Neck is supple.  Chest is clear to auscultation with normal expansion.  Cardiovascular exam is irregular Abdominal exam nontender or distended. No masses palpated. Right groin with no hematoma; positive ecchymoses Extremities show trace edema; necrotic digit LLE. neuro grossly intact    Lab Results: Basic Metabolic Panel:  Recent Labs  11/05/15 0532 11/05/15 0930 11/06/15 0527  NA 140  --  141  K 4.8  --  4.3  CL 113*  --  113*  CO2 20*  --  22  GLUCOSE 86  --  83  BUN 27*  --  27*  CREATININE 1.88*  --  2.33*  CALCIUM 9.0  --  8.5*  MG  --  2.4  --    CBC:  Recent Labs  11/05/15 0532 11/06/15 0527  WBC 12.3* 9.4  HGB 14.2 13.0  HCT 43.4 40.0  MCV 95.2 96.4  PLT 329 285   Cardiac Enzymes:  Recent Labs  11/05/15 0856  TROPONINI 0.09*     Assessment/Plan:  1 atrial fibrillation-patient remains in atrial fibrillation. He is having some aberrancy on telemetry. Continue beta blocker for rate control. Given renal insufficiency discontinue digoxin and follow heart rate. Resume heparin/Coumadin when okay with vascular surgery. 2 chronic stage III renal insufficiency/status post renal transplant-patient was on 200 mg of Lasix twice a day prior to admission. He is not receiving Lasix  now. Will need to resume postoperatively. Primary service plans to consult nephrology. 3 toe gangrene-for amputation today. 4 Hypertension-continue present blood pressure medications and follow.   Kirk Ruths 11/06/2015, 8:39 AM

## 2015-11-06 NOTE — Progress Notes (Signed)
SLP Cancellation Note  Patient Details Name: Mark [name] MRN: [id number] DOB: 1940/07/29   Cancelled treatment:       Reason Eval/Treat Not Completed: Patient at procedure or test/unavailable. Pt is NPO for surgery. It is not certain that procedure will be done. SLP will check chart at noon to see if pt is in procedure. If not, SLP will complete swallow eval. If he does go to surgery, will try to see him later in pm if possible.    Mark [name], Mark [name] 11/06/2015, 8:31 AM

## 2015-11-06 NOTE — Progress Notes (Addendum)
Upon assessment patient experiencing SOB; Resp 34. Pt on 3L N/C oxygen sat 94%. Other VSS.Pt was on room air during day shift, then needed oxygen due to increased Resp per day shift nurse.  Lungs diminished. Pt denies pain at this time, just stated that he is cold.   Rapid Response nurse that bedside to assess pt. RRN auscultated crackles on the Left side. Baltazar Najjar, NP paged and made aware.   Baltazar Najjar returned page. New orders for 40 IV Lasix and 1 mg Morphine. STAT Port Chest Xray also ordered. RN will follow orders and continue to monitor.  Arnell Sieving, RN

## 2015-11-06 NOTE — Interval H&P Note (Signed)
History and Physical Interval Note:  11/06/2015 1:47 PM  Mark [name]  has presented today for surgery, with the diagnosis of Nonviable tissue left great toe  The various methods of treatment have been discussed with the patient and family. After consideration of risks, benefits and other options for treatment, the patient has consented to  Procedure(s): AMPUTATION LEFT GREAT TOE (Left) as a surgical intervention .  The patient's history has been reviewed, patient examined, no change in status, stable for surgery.  I have reviewed the patient's chart and labs.  Questions were answered to the patient's satisfaction.     Ruta Hinds

## 2015-11-06 NOTE — Anesthesia Postprocedure Evaluation (Signed)
Anesthesia Post Note  Patient: NERIK CONDRAY  Procedure(s) Performed: Procedure(s) (LRB): AMPUTATION LEFT GREAT TOE (Left)  Patient location during evaluation: PACU Anesthesia Type: General Level of consciousness: awake and alert Pain management: pain level controlled Vital Signs Assessment: post-procedure vital signs reviewed and stable Respiratory status: spontaneous breathing, nonlabored ventilation, respiratory function stable and patient connected to nasal cannula oxygen Cardiovascular status: blood pressure returned to baseline and stable Postop Assessment: no signs of nausea or vomiting Anesthetic complications: no    Last Vitals:  Vitals:   11/06/15 1610 11/06/15 1641  BP:  (!) 165/72  Pulse: 92 95  Resp: (!) 23 20  Temp:  36.6 C    Last Pain:  Vitals:   11/06/15 1641  TempSrc: Oral  PainSc:                  Kanyla Omeara,JAMES TERRILL

## 2015-11-06 NOTE — Progress Notes (Addendum)
Called to assist with assessment of patient with SOB.  On arrival patient alert - speaking full sentences - warm and dry - RR 34 O2 sats 92% on 3 liters nasal cannula - patient states he always breathes like this when he is cold - RN placing warm blankets on patient - denies CP - bil BS present -some diffuse rales noted left side - no use of accessory muscles - BP 141/58 HR 78  left foot dressing with moderate amount bloody drainage - RN Jinny Blossom paging Tylene Fantasia NP - orders noted.  Will follow as needed.

## 2015-11-06 NOTE — Progress Notes (Signed)
    Subjective  - POD #1  No complaints this am   Physical Exam:  B LE edema Good DP doppler signal on left Ecchymosis in left groin.  No significant hematoma Slightly decreased erythema to left great toe      Assessment/Plan:  POD #1  Plan for left great toe amputation today.  Depending on quality of tissue, may need to have th ewound left open and packed for healing.  Patient also understands that he is at risk for a more proximal amputation if this does not heal.  Will restart heparin 4 hours after toe amputation without bolus.  Annamarie Major 11/06/2015 11:29 AM --  Vitals:   11/06/15 0317 11/06/15 0807  BP: 95/64 (!) 118/48  Pulse: 77 71  Resp: 15 (!) 22  Temp: 98.1 F (36.7 C) 98.2 F (36.8 C)    Intake/Output Summary (Last 24 hours) at 11/06/15 1129 Last data filed at 11/06/15 0700  Gross per 24 hour  Intake              125 ml  Output              825 ml  Net             -700 ml     Laboratory CBC    Component Value Date/Time   WBC 9.4 11/06/2015 0527   HGB 13.0 11/06/2015 0527   HCT 40.0 11/06/2015 0527   PLT 285 11/06/2015 0527    BMET    Component Value Date/Time   NA 141 11/06/2015 0527   K 4.3 11/06/2015 0527   CL 113 (H) 11/06/2015 0527   CO2 22 11/06/2015 0527   GLUCOSE 83 11/06/2015 0527   BUN 27 (H) 11/06/2015 0527   CREATININE 2.33 (H) 11/06/2015 0527   CALCIUM 8.5 (L) 11/06/2015 0527   GFRNONAA 26 (L) 11/06/2015 0527   GFRAA 30 (L) 11/06/2015 0527    COAG Lab Results  Component Value Date   INR 1.51 11/06/2015   INR 1.54 11/05/2015   INR 1.64 11/04/2015   No results found for: PTT  Antibiotics Anti-infectives    Start     Dose/Rate Route Frequency Ordered Stop   11/06/15 1115  ceFAZolin (ANCEF) IVPB 1 g/50 mL premix    Comments:  Send with pt to OR   1 g 100 mL/hr over 30 Minutes Intravenous On call 11/05/15 2346 11/07/15 1115   10/29/15 2200  [MAR Hold]  vancomycin (VANCOCIN) IVPB 1000 mg/200 mL premix     (MAR  Hold since 11/06/15 1050)   1,000 mg 200 mL/hr over 60 Minutes Intravenous Every 24 hours 10/28/15 2154     10/28/15 2200  [MAR Hold]  piperacillin-tazobactam (ZOSYN) IVPB 3.375 g     (MAR Hold since 11/06/15 1050)   3.375 g 12.5 mL/hr over 240 Minutes Intravenous Every 8 hours 10/28/15 2120     10/28/15 2200  vancomycin (VANCOCIN) 2,500 mg in sodium chloride 0.9 % 500 mL IVPB     2,500 mg 250 mL/hr over 120 Minutes Intravenous  Once 10/28/15 2154 10/29/15 2330       V. Leia Alf, M.D. [facility] Office: [phone] Pager:  [phone]

## 2015-11-06 NOTE — Op Note (Signed)
Procedure: Amputation right first toe with resection of metatarsal head  Preoperative diagnosis: Osteomyelitis right first toe  Postoperative diagnosis: Same  Anesthesia: Gen.  Indications: Patient recently had revascularization of his right lower extremity. He has a gangrenous right first toe Specimens: Right first toe, culture of toe Assistant: Nurse  Operative details: After obtaining informed consent, the patient was taken to the operating room. The patient was placed in supine position on the operating room table. After induction of general anesthesia the patient's entire right foot was prepped and draped in usual sterile fashion. A circumferential incision was made at the base of the right first toe. The incision was carried all the way down to the level of the bone. There was some pulsatile bleeding from the skin edge. This was controlled with cautery. The bone was transected with a bone cutter in the midportion of the proximal phalanx. The remainder of the proximal phalanx was debrided away with rongeurs. The metatarsal head was removed with rongeurs. The wound was thoroughly irrigated with normal saline solution. Hemostasis was obtained. The skin was left open and a normal saline wet to dry dressing was applied. The patient tolerated procedure well and there were no complications. Instrument sponge and needle counts were correct at the end of the case. The patient was taken to recovery in stable condition.   Ruta Hinds, MD  [facility]  Office: [phone]  Pager: [phone]

## 2015-11-06 NOTE — Progress Notes (Signed)
ANTICOAGULATION CONSULT NOTE - Follow Up Consult  Pharmacy Consult for Heparin Indication: atrial fibrillation  Allergies  Allergen Reactions  . Tape Other (See Comments)    SKIN WILL TEAR!!    Patient Measurements: Height: 5\' 9"  (175.3 cm) Weight: 242 lb 8.1 oz (110 kg) IBW/kg (Calculated) : 70.7  Vital Signs: Temp: 97.8 F (36.6 C) (08/02 1641) Temp Source: Oral (08/02 1641) BP: 165/72 (08/02 1641) Pulse Rate: 95 (08/02 1641)  Labs:  Recent Labs  11/04/15 0443 11/05/15 0532 11/05/15 0856 11/06/15 0527 11/06/15 1540  HGB 13.8 14.2  --  13.0  --   HCT 43.4 43.4  --  40.0  --   PLT 335 329  --  285  --   LABPROT 19.6* 18.6*  --  18.4*  --   INR 1.64 1.54  --  1.51  --   HEPARINUNFRC 0.71* 0.47  --   --   --   CREATININE 1.99* 1.88*  --  2.33*  --   TROPONINI  --   --  0.09*  --  0.06*    Estimated Creatinine Clearance: 33.5 mL/min (by C-G formula based on SCr of 2.33 mg/dL).  Assessment: 75yo male with AFib and s/p toe amputation, to resume Heparin.  Heparin been on hold prior to OR due to hematoma, but per Dr.Brabham's note earlier today there is no significant hematoma.   Heparin had been therapeutic on 1500 units/hr.  Goal of Therapy:  Heparin level 0.3-0.7 units/ml Monitor platelets by anticoagulation protocol: Yes   Plan:  Heparin 1500 units/hr Check Heparin level and CBC 8hr, then daily Watch for s/s of bleeding  Mark [name], [facility]

## 2015-11-06 NOTE — Progress Notes (Addendum)
Paged Dr. Susy Manor -related to heparin restart and and existing hematoma. Dr. Susy Manor states to hold heparin for now. And hold po meds including prograf until evaluated by SLP to determine safe oral medication administration since patient had an episode after sheath pull of coughing and gurgling after sipping fluids.

## 2015-11-06 NOTE — Progress Notes (Signed)
Dr. Trula Slade paged and notified that heparin was held on patient last night because of hematoma. Will come up to see patient after his meeting. Hold heparin for now until evaluated by Dr. Trula Slade.

## 2015-11-06 NOTE — Progress Notes (Signed)
SLP Cancellation Note  Patient Details Name: TAYSHAUN PARZIALE MRN: [id number] DOB: 1941-02-15   Cancelled treatment:       Reason Eval/Treat Not Completed: Patient at procedure or test/unavailable. Will f/u on next date as able.   Germain Osgood 11/06/2015, 3:25 PM  Germain Osgood, M.A. CCC-SLP [phone]

## 2015-11-06 NOTE — H&P (View-Only) (Signed)
    Subjective  - POD #1  No complaints this am   Physical Exam:  B LE edema Good DP doppler signal on left Ecchymosis in left groin.  No significant hematoma Slightly decreased erythema to left great toe      Assessment/Plan:  POD #1  Plan for left great toe amputation today.  Depending on quality of tissue, may need to have th ewound left open and packed for healing.  Patient also understands that he is at risk for a more proximal amputation if this does not heal.  Will restart heparin 4 hours after toe amputation without bolus.  Annamarie Major 11/06/2015 11:29 AM --  Vitals:   11/06/15 0317 11/06/15 0807  BP: 95/64 (!) 118/48  Pulse: 77 71  Resp: 15 (!) 22  Temp: 98.1 F (36.7 C) 98.2 F (36.8 C)    Intake/Output Summary (Last 24 hours) at 11/06/15 1129 Last data filed at 11/06/15 0700  Gross per 24 hour  Intake              125 ml  Output              825 ml  Net             -700 ml     Laboratory CBC    Component Value Date/Time   WBC 9.4 11/06/2015 0527   HGB 13.0 11/06/2015 0527   HCT 40.0 11/06/2015 0527   PLT 285 11/06/2015 0527    BMET    Component Value Date/Time   NA 141 11/06/2015 0527   K 4.3 11/06/2015 0527   CL 113 (H) 11/06/2015 0527   CO2 22 11/06/2015 0527   GLUCOSE 83 11/06/2015 0527   BUN 27 (H) 11/06/2015 0527   CREATININE 2.33 (H) 11/06/2015 0527   CALCIUM 8.5 (L) 11/06/2015 0527   GFRNONAA 26 (L) 11/06/2015 0527   GFRAA 30 (L) 11/06/2015 0527    COAG Lab Results  Component Value Date   INR 1.51 11/06/2015   INR 1.54 11/05/2015   INR 1.64 11/04/2015   No results found for: PTT  Antibiotics Anti-infectives    Start     Dose/Rate Route Frequency Ordered Stop   11/06/15 1115  ceFAZolin (ANCEF) IVPB 1 g/50 mL premix    Comments:  Send with pt to OR   1 g 100 mL/hr over 30 Minutes Intravenous On call 11/05/15 2346 11/07/15 1115   10/29/15 2200  [MAR Hold]  vancomycin (VANCOCIN) IVPB 1000 mg/200 mL premix     (MAR  Hold since 11/06/15 1050)   1,000 mg 200 mL/hr over 60 Minutes Intravenous Every 24 hours 10/28/15 2154     10/28/15 2200  [MAR Hold]  piperacillin-tazobactam (ZOSYN) IVPB 3.375 g     (MAR Hold since 11/06/15 1050)   3.375 g 12.5 mL/hr over 240 Minutes Intravenous Every 8 hours 10/28/15 2120     10/28/15 2200  vancomycin (VANCOCIN) 2,500 mg in sodium chloride 0.9 % 500 mL IVPB     2,500 mg 250 mL/hr over 120 Minutes Intravenous  Once 10/28/15 2154 10/29/15 2330       V. Leia Alf, M.D. [facility] Office: [phone] Pager:  [phone]

## 2015-11-06 NOTE — Progress Notes (Signed)
Paged K. Schorr from internal med. No response. Paged Dr. Susy Manor cardiology, no response. Paged at 11/05/2015@ 2220. Will hold patients PO night time meds due to concern for safety of swallowing. Pt unable to pass initial swallow screen.   Paged K.Kirby, NP at [facility]- ordered at 0018 to hold PO meds for now.

## 2015-11-06 NOTE — Progress Notes (Addendum)
Patient ID: Mark [name], male   DOB: September 08, 1940, 75 y.o.   MRN: [id number]                                                                PROGRESS NOTE                                                                                                                                                                                                             Patient Demographics:    [name], is a 75 y.o. male, DOB - [birth year], [id number]  Admit date - 10/28/2015   Admitting Physician Etta Quill, DO  Outpatient Primary MD for the patient is Rochel Brome, MD  LOS - 9  Outpatient Specialists:   Chief Complaint  Patient presents with  . 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 Patient presents to ED with c/o 'his toe is dead'. This onset about 1 month ago. Finally saw doctor today after he fell getting out of the shower this morning. His doctor was concerned about infection and sent him to the ED. No fever no vomiting.Toe found to begrossly infected and necrotic . Patient followed by [name], DPM .Patient underwent abdominal aortogram with angioplasty of the left anterior tibial artery/left popliteal artery on 8/1   Subjective:    [name]  developed a right groin hematoma and heparin was held last night,  Npo for the amputation, patient was found to have A. fib with aberrancy due to LBBB overnight.     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)   Cellulitis of great toe of left foot   Aspiration of liquid   Shortness of breath, Suspect acute on chronic diastolic heart  failure Likely secondary to pulmonary vascular congestion  Chest x-ray shows mild congestive changes, patient noted to be tachypneic but not particularly hypoxic  or tachycardic Was given Lasix yesterday, creatinine slightly up , hold off on further Lasix Patient anticipated to have toe amputation today, have requested cardiology to see the patient prior to surgery Troponin slightly elevated at 0.09, repeat troponin pending, 2-D echo did not show any wall motion abnormalities and the patient does not appear to have any ischemic changes on his EKG    Gangrene of left  toe - Zosyn / vanc -   .ABI shows no blood flow to the left toe,  [name], patient status post arteriogram to evaluate blood flow to his left foot.Situation complicated by renal transplant with elevated creatinine, held IV fluids due to shortness of breath,  Coumadin on hold and patient is currently on a heparin drip    According to [facility] and AHA guidelines and cardiology, he requires no further cardiac workup prior to his noncardiac surgery and should be at acceptable risk   Chronic kidney disease stage IV, status post renal transplant , baseline creatinine around 1.7 creatinine has been trending up over the past year, but improved since admission, continue gentle IV hydration, follow creatinine closely. Creatinine has been stable for several days. Nephrology consulted to monitor patient's renal function in the setting of upcoming procedure/angiogram.Continue anti-rejection meds, appreciate [name] following along during this hospitalization, Prograf level was low and [name] increased prograf, appreciate his input   A fib with nsvt vs aberrancy due to LBBB- Holding coumadin, heparin per pharmacy, rate controlled , preop 2-D echo shows grade 2 diastolic dysfunction, pulmonary hypertension, EKG/telemetry shows A. fib rate controlled, cont digoxin, metoprolol, Cardiology consulted for preop clearance  , pt has been cleared by cardiology for surgery Continue beta blocker, cardiology notified on 8/2 to review EKG telemetry prior to patient's upcoming surgery  DM2 - Lantus reduced   to 20 units, contpre-meal NovoLog 5 units.Moderate dose mealtime and SSI AC/HS, start low-dose D10 as the patient is NPO , was hypoglycemic last night Hemoglobin A1c 8.2  Hypertension: uncontrolled Continue norvasc to 10mg  po qday , metoprolol Prn hydralazine  Hypothyroidism-continue Synthroid  Protein Calorie Malnutrition prostat  Dyslipidemia-continue Crestor  Pulmonary hypertension:  [name] has moderate pulmonary hypertension.  Recommend outpatient sleep study and PFTs.  This may also be due to left-sided heart disease. He does have grade 2 diastolic dysfunction. Recommend diuresis with stable per nephrology.  There is no evidence of liver disease by laboratory testing   Code Status : FULL CODE  Family Communication  :   Disposition Plan  :  ? Rehab vs home  Barriers For Discharge :   Consults  :  Vascular surgery, nephrology, cardiology  Procedures  : Atherectomy and drug coated balloon angioplasty 8/1 , toe amputation 8/2  DVT Prophylaxis  :  Heparin     Lab Results  Component Value Date   PLT 285 11/06/2015    Antibiotics  :    Anti-infectives    Start     Dose/Rate Route Frequency Ordered Stop   11/06/15 1115  ceFAZolin (ANCEF) IVPB 1 g/50 mL premix    Comments:  Send with pt to OR   1 g 100 mL/hr over 30 Minutes Intravenous On call 11/05/15 2346 11/07/15 1115   10/29/15 2200  vancomycin (VANCOCIN) IVPB 1000 mg/200 mL premix     1,000 mg 200 mL/hr over 60 Minutes Intravenous Every 24 hours 10/28/15 2154     10/28/15 2200  piperacillin-tazobactam (ZOSYN) IVPB 3.375 g     3.375 g 12.5 mL/hr over  240 Minutes Intravenous Every 8 hours 10/28/15 2120     10/28/15 2200  vancomycin (VANCOCIN) 2,500 mg in sodium chloride 0.9 % 500 mL IVPB     2,500 mg 250 mL/hr over  120 Minutes Intravenous  Once 10/28/15 2154 10/29/15 2330        Objective:   Vitals:   11/05/15 2300 11/06/15 0000 11/06/15 0100 11/06/15 0317  BP: 140/64 (!) 144/78 (!) 147/64 95/64  Pulse: (!) 59 68 79 77  Resp: (!) 25 (!) 27 15 15   Temp:    98.1 F (36.7 C)  TempSrc:    Oral  SpO2: 93% 93% 93% 93%  Weight:    110 kg (242 lb 8.1 oz)  Height:        Wt Readings from Last 3 Encounters:  11/06/15 110 kg (242 lb 8.1 oz)  09/05/15 103.1 kg (227 lb 3.2 oz)  08/05/15 101.8 kg (224 lb 6.4 oz)     Intake/Output Summary (Last 24 hours) at 11/06/15 0748 Last data filed at 11/06/15 0058  Gross per 24 hour  Intake              485 ml  Output             1000 ml  Net             -515 ml     Physical Exam  Awake Alert, Oriented X 3, No new F.N deficits, Normal affect [place].AT,PERRAL Supple Neck,No JVD, No cervical lymphadenopathy appriciated.  Symmetrical Chest wall movement, Good air movement bilaterally, CTAB RRR,No Gallops,Rubs or new Murmurs, No Parasternal Heave +ve B.Sounds, Abd Soft, No tenderness, No organomegaly appriciated, No rebound - guarding or rigidity. No edema.  1st left foot black.    Data Review:    CBC  Recent Labs Lab 11/02/15 0257 11/03/15 0450 11/04/15 0443 11/05/15 0532 11/06/15 0527  WBC 9.6 10.0 10.7* 12.3* 9.4  HGB 13.5 13.3 13.8 14.2 13.0  HCT 40.7 40.9 43.4 43.4 40.0  PLT 287 297 335 329 285  MCV 93.1 93.4 95.2 95.2 96.4  MCH 30.9 30.4 30.3 31.1 31.3  MCHC 33.2 32.5 31.8 32.7 32.5  RDW 14.3 14.6 15.0 15.2 15.6*    Chemistries   Recent Labs Lab 11/02/15 0257 11/03/15 0450 11/04/15 0443 11/05/15 0532 11/05/15 0930 11/06/15 0527  NA 136 140 139 140  --  141  K 3.5 3.8 4.0 4.8  --  4.3  CL 105 114* 113* 113*  --  113*  CO2 25 22 21* 20*  --  22  GLUCOSE 119* 108* 156* 86  --  83  BUN 29* 29* 30* 27*  --  27*  CREATININE 1.89* 1.88* 1.99* 1.88*  --  2.33*  CALCIUM 8.4* 8.6* 8.8* 9.0  --  8.5*  MG 2.1  --   --   --  2.4  --    AST 19 21 26  40  --  26  ALT 14* 14* 16* 21  --  18  ALKPHOS 57 67 88 102  --  85  BILITOT 0.9 1.1 1.0 1.3*  --  0.8   ------------------------------------------------------------------------------------------------------------------ No results for input(s): CHOL, HDL, LDLCALC, TRIG, CHOLHDL, LDLDIRECT in the last 72 hours.  Lab Results  Component Value Date   HGBA1C 8.2 (H) 10/29/2015   ------------------------------------------------------------------------------------------------------------------ No results for input(s): TSH, T4TOTAL, T3FREE, THYROIDAB in the last 72 hours.  Invalid input(s): FREET3 ------------------------------------------------------------------------------------------------------------------ No results for input(s): VITAMINB12, FOLATE, FERRITIN, TIBC, IRON, RETICCTPCT in the  last 72 hours.  Coagulation profile  Recent Labs Lab 11/02/15 0817 11/03/15 0450 11/04/15 0443 11/05/15 0532 11/06/15 0527  INR 1.90 1.87 1.64 1.54 1.51    No results for input(s): DDIMER in the last 72 hours.  Cardiac Enzymes  Recent Labs Lab 11/05/15 0856  TROPONINI 0.09*   ------------------------------------------------------------------------------------------------------------------ No results found for: BNP  Inpatient Medications  Scheduled Meds: . allopurinol  150 mg Oral Daily  . amLODipine  10 mg Oral Daily  . calcitRIOL  0.5 mcg Oral Daily  .  ceFAZolin (ANCEF) IV  1 g Intravenous On Call  . Chlorhexidine Gluconate Cloth  6 each Topical Daily  . digoxin  0.125 mg Oral Daily  . docusate sodium  100 mg Oral Daily  . famotidine  20 mg Oral QHS  . feeding supplement (GLUCERNA SHAKE)  237 mL Oral Q1500  . feeding supplement (PRO-STAT SUGAR FREE 64)  30 mL Oral BID BM  . insulin aspart  0-15 Units Subcutaneous TID WC  . insulin aspart  5 Units Subcutaneous TID WC  . insulin glargine  20 Units Subcutaneous QHS  . levothyroxine  137 mcg Oral QAC breakfast    . magnesium oxide  400 mg Oral BID  . metoprolol succinate  100 mg Oral Daily  .  morphine injection  2 mg Intravenous Once  . mupirocin ointment  1 application Nasal BID  . phosphorus  250 mg Oral BID  . piperacillin-tazobactam (ZOSYN)  IV  3.375 g Intravenous Q8H  . potassium chloride  20 mEq Oral BID  . predniSONE  5 mg Oral Daily  . rosuvastatin  10 mg Oral QPM  . tacrolimus  1 mg Oral BID  . vancomycin  1,000 mg Intravenous Q24H   Continuous Infusions: . sodium chloride Stopped (11/05/15 2059)  . dextrose 25 mL/hr at 11/06/15 0030   PRN Meds:.acetaminophen, alum & mag hydroxide-simeth, guaiFENesin-dextromethorphan, hydrALAZINE, labetalol, levalbuterol, metoprolol, ondansetron (ZOFRAN) IV, phenol  Micro Results Recent Results (from the past 240 hour(s))  Blood Cultures x 2 sites     Status: None   Collection Time: 10/28/15 10:00 PM  Result Value Ref Range Status   Specimen Description BLOOD RIGHT FOREARM  Final   Special Requests BOTTLES DRAWN AEROBIC AND ANAEROBIC 4CC  Final   Culture NO GROWTH 5 DAYS  Final   Report Status 11/02/2015 FINAL  Final  Blood Cultures x 2 sites     Status: None   Collection Time: 10/28/15 11:04 PM  Result Value Ref Range Status   Specimen Description BLOOD LEFT ARM  Final   Special Requests AEROBIC BOTTLE ONLY 7ML  Final   Culture NO GROWTH 5 DAYS  Final   Report Status 11/02/2015 FINAL  Final  C difficile quick scan w PCR reflex     Status: None   Collection Time: 11/04/15  9:42 AM  Result Value Ref Range Status   C Diff antigen NEGATIVE NEGATIVE Final   C Diff toxin NEGATIVE NEGATIVE Final   C Diff interpretation No C. difficile detected.  Final  Surgical PCR screen     Status: Abnormal   Collection Time: 11/05/15 11:18 PM  Result Value Ref Range Status   MRSA, PCR NEGATIVE NEGATIVE Final   Staphylococcus aureus POSITIVE (A) NEGATIVE Final    Comment:        The Xpert SA Assay (FDA approved for NASAL specimens in patients over 72  years of age), is one component of a comprehensive surveillance program.  Test performance has  been validated by [facility] for patients greater than or equal to 34 year old. It is not intended to diagnose infection nor to guide or monitor treatment.     Radiology Reports Dg Chest Port 1 View  Result Date: 11/06/2015 CLINICAL DATA:  75 year old male with aspiration of liquid. EXAM: PORTABLE CHEST 1 VIEW COMPARISON:  Chest radiograph dated 11/05/2015 FINDINGS: Mild cardiomegaly with central vascular congestion. Bibasilar Interstitial prominence and nodular densities, new from prior study may represent atelectatic changes versus infiltrate. There is no pleural effusion or pneumothorax. Top-normal cardiac silhouette. No acute osseous pathology. IMPRESSION: Mild congestive changes with bibasilar subsegmental atelectasis versus infiltrate. Electronically Signed   By: Anner Crete M.D.   On: 11/06/2015 05:16   Dg Chest Port 1 View  Result Date: 11/05/2015 CLINICAL DATA:  Shortness of breath EXAM: PORTABLE CHEST 1 VIEW COMPARISON:  March 14, 2015 FINDINGS: The lungs are clear. Heart is mildly enlarged with pulmonary vascularity within normal limits. No adenopathy. No bone lesions. IMPRESSION: Mild cardiac enlargement.  No edema or consolidation. Electronically Signed   By: Lowella Grip III M.D.   On: 11/05/2015 09:42   Dg Foot Complete Left  Result Date: 10/28/2015 CLINICAL DATA:  Left big toe pain. EXAM: LEFT FOOT - COMPLETE 3+ VIEW COMPARISON:  None. FINDINGS: There is no evidence of fracture or dislocation. No evidence of dystrophic bony changes. There is soft tissue swelling of the distal first digit. Punctate lucencies within the area of soft tissue swelling may represent entrapped fat or potentially foci of gas. Heavy vascular calcifications are seen. IMPRESSION: No evidence of fracture of the left foot. Soft tissue swelling of the distal first digit with punctate lucencies within the  area of swelling, which may represent entrapped fat or potentially foci of gas. Please correlate clinically regarding the possibility of cellulitis. Electronically Signed   By: Fidela Salisbury M.D.   On: 10/28/2015 21:01   Time Spent in minutes  29   Jequan Shahin M.D on 11/06/2015 at 7:48 AM  Between 7am to 7pm - Pager - [phone]  After 7pm go to [email or web address] - password [facility]  [facility] -  Office  [phone]

## 2015-11-06 NOTE — Transfer of Care (Signed)
Immediate Anesthesia Transfer of Care Note  Patient: Mark [name]  Procedure(s) Performed: Procedure(s): AMPUTATION LEFT GREAT TOE (Left)  Patient Location: PACU  Anesthesia Type:General  Level of Consciousness: awake, alert  and oriented  Airway & Oxygen Therapy: Patient Spontanous Breathing and Patient connected to nasal cannula oxygen  Post-op Assessment: Report given to RN and Post -op Vital signs reviewed and stable  Post vital signs: Reviewed and stable  Last Vitals:  Vitals:   11/06/15 0807 11/06/15 1459  BP: (!) 118/48   Pulse: 71   Resp: (!) 22   Temp: 36.8 C 36.6 C    Last Pain:  Vitals:   11/06/15 1459  TempSrc:   PainSc: Asleep      Patients Stated Pain Goal: 0 ([id number])  Complications: No apparent anesthesia complications

## 2015-11-06 NOTE — Progress Notes (Signed)
Discussed swallowing issues with Dr. Trula Slade and Dr. Allyson Sabal. Will give am medications to patient after evaluating patients ability to swallow.

## 2015-11-06 NOTE — Anesthesia Procedure Notes (Signed)
Procedure Name: LMA Insertion Date/Time: 11/06/2015 2:13 PM Performed by: Garrison Columbus T Pre-anesthesia Checklist: Patient identified, Emergency Drugs available, Suction available and Patient being monitored Patient Re-evaluated:Patient Re-evaluated prior to inductionOxygen Delivery Method: Circle System Utilized Preoxygenation: Pre-oxygenation with 100% oxygen Intubation Type: IV induction LMA: LMA inserted LMA Size: 5.0 Number of attempts: 1 Airway Equipment and Method: Bite block Tube secured with: Tape Dental Injury: Teeth and Oropharynx as per pre-operative assessment  Comments: Inserted by [place], [place]

## 2015-11-06 NOTE — Anesthesia Preprocedure Evaluation (Signed)
Anesthesia Evaluation  Patient identified by MRN, date of birth, ID band Patient awake    History of Anesthesia Complications Negative for: history of anesthetic complications  Airway Mallampati: II  TM Distance: >3 FB Neck ROM: Full    Dental  (+) Teeth Intact   Pulmonary shortness of breath,     + decreased breath sounds      Cardiovascular hypertension, + Peripheral Vascular Disease   Rhythm:Irregular Rate:Normal     Neuro/Psych    GI/Hepatic   Endo/Other  diabetesHypothyroidism Morbid obesity  Renal/GU Renal InsufficiencyRenal diseaseKidney transplant     Musculoskeletal Legs are swollen and edematous   Abdominal (+) + obese,   Peds  Hematology   Anesthesia Other Findings   Reproductive/Obstetrics                             Anesthesia Physical Anesthesia Plan  ASA: III  Anesthesia Plan: General   Post-op Pain Management:    Induction: Intravenous  Airway Management Planned: LMA  Additional Equipment:   Intra-op Plan:   Post-operative Plan: Extubation in OR  Informed Consent: I have reviewed the patients History and Physical, chart, labs and discussed the procedure including the risks, benefits and alternatives for the proposed anesthesia with the patient or authorized representative who has indicated his/her understanding and acceptance.     Plan Discussed with:   Anesthesia Plan Comments:         Anesthesia Quick Evaluation

## 2015-11-07 ENCOUNTER — Encounter (HOSPITAL_COMMUNITY): Payer: Self-pay | Admitting: Vascular Surgery

## 2015-11-07 ENCOUNTER — Telehealth: Payer: Self-pay | Admitting: Vascular Surgery

## 2015-11-07 ENCOUNTER — Ambulatory Visit: Payer: Medicare Other | Admitting: Endocrinology

## 2015-11-07 DIAGNOSIS — R0989 Other specified symptoms and signs involving the circulatory and respiratory systems: Secondary | ICD-10-CM

## 2015-11-07 LAB — BLOOD GAS, ARTERIAL
ACID-BASE DEFICIT: 4.7 mmol/L — AB (ref 0.0–2.0)
Bicarbonate: 19.1 mEq/L — ABNORMAL LOW (ref 20.0–24.0)
DRAWN BY: 418751
FIO2: 0.36
O2 SAT: 94.4 %
PATIENT TEMPERATURE: 98.6
PCO2 ART: 30.9 mmHg — AB (ref 35.0–45.0)
TCO2: 20.1 mmol/L (ref 0–100)
pH, Arterial: 7.408 (ref 7.350–7.450)
pO2, Arterial: 71.1 mmHg — ABNORMAL LOW (ref 80.0–100.0)

## 2015-11-07 LAB — HEPARIN LEVEL (UNFRACTIONATED)
HEPARIN UNFRACTIONATED: 0.34 [IU]/mL (ref 0.30–0.70)
Heparin Unfractionated: 0.39 IU/mL (ref 0.30–0.70)

## 2015-11-07 LAB — CBC
HCT: 38.4 % — ABNORMAL LOW (ref 39.0–52.0)
HEMOGLOBIN: 12.4 g/dL — AB (ref 13.0–17.0)
MCH: 30.5 pg (ref 26.0–34.0)
MCHC: 32.3 g/dL (ref 30.0–36.0)
MCV: 94.6 fL (ref 78.0–100.0)
PLATELETS: 281 10*3/uL (ref 150–400)
RBC: 4.06 MIL/uL — AB (ref 4.22–5.81)
RDW: 15.5 % (ref 11.5–15.5)
WBC: 9.8 10*3/uL (ref 4.0–10.5)

## 2015-11-07 LAB — COMPREHENSIVE METABOLIC PANEL
ALBUMIN: 2 g/dL — AB (ref 3.5–5.0)
ALT: 17 U/L (ref 17–63)
ANION GAP: 8 (ref 5–15)
AST: 25 U/L (ref 15–41)
Alkaline Phosphatase: 87 U/L (ref 38–126)
BUN: 30 mg/dL — AB (ref 6–20)
CALCIUM: 8.5 mg/dL — AB (ref 8.9–10.3)
CHLORIDE: 111 mmol/L (ref 101–111)
CO2: 20 mmol/L — ABNORMAL LOW (ref 22–32)
Creatinine, Ser: 2.44 mg/dL — ABNORMAL HIGH (ref 0.61–1.24)
GFR calc Af Amer: 28 mL/min — ABNORMAL LOW (ref >60)
GFR, EST NON AFRICAN AMERICAN: 24 mL/min — AB (ref >60)
Glucose, Bld: 141 mg/dL — ABNORMAL HIGH (ref 65–99)
POTASSIUM: 4.3 mmol/L (ref 3.5–5.1)
Sodium: 139 mmol/L (ref 135–145)
TOTAL PROTEIN: 5.8 g/dL — AB (ref 6.5–8.1)
Total Bilirubin: 0.9 mg/dL (ref 0.3–1.2)

## 2015-11-07 LAB — GLUCOSE, CAPILLARY
GLUCOSE-CAPILLARY: 124 mg/dL — AB (ref 65–99)
GLUCOSE-CAPILLARY: 172 mg/dL — AB (ref 65–99)
GLUCOSE-CAPILLARY: 172 mg/dL — AB (ref 65–99)
Glucose-Capillary: 296 mg/dL — ABNORMAL HIGH (ref 65–99)

## 2015-11-07 LAB — PROTIME-INR
INR: 1.73
PROTHROMBIN TIME: 20.5 s — AB (ref 11.4–15.2)

## 2015-11-07 MED ORDER — FUROSEMIDE 10 MG/ML IJ SOLN
80.0000 mg | Freq: Two times a day (BID) | INTRAMUSCULAR | Status: DC
  Administered 2015-11-07 (×2): 80 mg via INTRAVENOUS
  Filled 2015-11-07 (×2): qty 8

## 2015-11-07 NOTE — Progress Notes (Signed)
RN called for increase WOB on arrival pt alert, speaking full sentences. Skin warm and dry. Wheezing noted bilaterally. No use of accessory muscles. RR 32, HR 76. Pt denies CP. PRN breathing treatment and ABG obtained.

## 2015-11-07 NOTE — Telephone Encounter (Signed)
-----   Message from Mena Goes, RN sent at 11/06/2015  3:51 PM EDT ----- Regarding: schedule   ----- Message ----- From: Gabriel Earing, PA-C Sent: 11/06/2015   2:55 PM To: [facility] Charge Pool  S/p left great toe amputation (by Dr. Oneida Alar) 11/06/15.  Pt needs to see Dr. Trula Slade back in 2 weeks.  Thanks, Aldona Bar

## 2015-11-07 NOTE — Progress Notes (Signed)
[facility]  Subjective  - Short of breath this am, now feeling a little better.   Objective (!) 159/86 71 98.7 F (37.1 C) (Oral) (!) 24 96%  Intake/Output Summary (Last 24 hours) at 11/07/15 0817 Last data filed at 11/07/15 0104  Gross per 24 hour  Intake              300 ml  Output              210 ml  Net               90 ml    Left first ray amputation site with re enforced dressing secondary to bleeding.  New dressing applied.  No active bleeding.  Deep packed dressing maintained at wound base. Doppler biphasic DP, monophasic PT left LE Right groin soft without frank hematoma.  Remains somewhat SOB on O2 [place]  Moderate edema UE and LE Lungs decreased breath sound at bases  Heart A fib irregular rate  Assessment/Planning: POD # 1 Left great toe amputation Heparin for A fib will asked my attending about coumadin re start date  Cr increased 2.44 from 2.33 edema from fluid over load Lasix ordered by Cardiology for diuresis. CKD stage III renal transplant patient Will order post op shoe for heel weight bearing ambulation.  Hold off ambulation until dressing change tomorrow and resolving shortness of breath.    Laurence Slate [facility] 11/07/2015 8:17 AM --  Laboratory Lab Results:  Recent Labs  11/06/15 0527 11/07/15 0055  WBC 9.4 9.8  HGB 13.0 12.4*  HCT 40.0 38.4*  PLT 285 281   BMET  Recent Labs  11/06/15 0527 11/07/15 0317  NA 141 139  K 4.3 4.3  CL 113* 111  CO2 22 20*  GLUCOSE 83 141*  BUN 27* 30*  CREATININE 2.33* 2.44*  CALCIUM 8.5* 8.5*    COAG Lab Results  Component Value Date   INR 1.73 11/07/2015   INR 1.51 11/06/2015   INR 1.54 11/05/2015   No results found for: PTT

## 2015-11-07 NOTE — Evaluation (Signed)
Clinical/Bedside Swallow Evaluation Patient Details  Name: Mark [name] MRN: [id number] Date of Birth: 1940-11-06  Today's Date: 11/07/2015 Time: SLP Start Time (ACUTE ONLY): 0845 SLP Stop Time (ACUTE ONLY): 0900 SLP Time Calculation (min) (ACUTE ONLY): 15 min  Past Medical History:  Past Medical History:  Diagnosis Date  . Atrial fibrillation ([place])   . Diabetes ([place])   . Gout   . Hypertension   . Hypothyroidism   . Renal disorder    kidney transplant   . Ulcer of other part of foot 12/15/2013   Past Surgical History:  Past Surgical History:  Procedure Laterality Date  . ANKLE FRACTURE SURGERY    . CATARACT EXTRACTION    . MELANOMA EXCISION    . NEPHRECTOMY TRANSPLANTED ORGAN    . PERIPHERAL VASCULAR CATHETERIZATION N/A 11/05/2015   Procedure: Abdominal Aortogram w/Lower Extremity;  Surgeon: Serafina Mitchell, MD;  Location: [facility];  Service: Cardiovascular;  Laterality: N/A;  . PERIPHERAL VASCULAR CATHETERIZATION Left 11/05/2015   Procedure: Peripheral Vascular Atherectomy;  Surgeon: Serafina Mitchell, MD;  Location: [facility];  Service: Cardiovascular;  Laterality: Left;  popiteal  . PERIPHERAL VASCULAR CATHETERIZATION Left 11/05/2015   Procedure: Peripheral Vascular Balloon Angioplasty;  Surgeon: Serafina Mitchell, MD;  Location: [facility];  Service: Cardiovascular;  Laterality: Left;  anterior tib   HPI:  Pt is a 75 y.o. 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 Presented to ED with gangrenous L great toe. Pt underwent abdominal aortogram with angioplasty of the left anterior tibial artery/left popliteal artery on 8/1. Pt was SOB on 8/2; also reportedly had episode of coughing/ gurgling after sipping liquids on 8/2. CXR showed increased bilateral pulmonary interstitial opacity. Had toe amputation 8/2 as well. Bedside swallow eval ordered to further assess aspiration risk.    Assessment / Plan /  Recommendation Clinical Impression  Pt with no overt s/s of aspiration at bedside. Pt does continue to be SOB this a.m and is independently taking small bites/ sips and pausing between bites/ sips to take deep breaths as needed. Pt would not attempt regular solid during eval- feeling full and does not like crackers. Pt reports no difficulty during meals but does report hx of GERD. Reviewed strategies with pt for reflux precautions and decreased respiratory status; both issues put pt at an increased risk of aspiration at this time. Recommend continuing regular diet/ thin liquids, meds whole with liquid or crushed in puree if any difficulties arise, intermittent supervision to cue pt to take small bites/ sips, ensure pt is upright 30-60 minutes after meal, cue pt to eat/ drink at a slow rate. Will f/u x1 to further assess tolerance of solids/ ensure diet tolerance.     Aspiration Risk  Mild aspiration risk;Moderate aspiration risk    Diet Recommendation Regular;Thin liquid   Liquid Administration via: Cup;Straw Medication Administration: Whole meds with liquid Supervision: Patient able to self feed;Intermittent supervision to cue for compensatory strategies Compensations: Slow rate;Small sips/bites Postural Changes: Seated upright at 90 degrees;Remain upright for at least 30 minutes after po intake    Other  Recommendations Oral Care Recommendations: Oral care BID   Follow up Recommendations   (TBD)    Frequency and Duration min 1 x/week  1 week       Prognosis        Swallow Study   General HPI: Pt is a 75 y.o. male with PMH of  DM2, ESRD s/p renal transplant some 12 years ago, A.Fib on coumadin. Presented to ED with gangrenous L great toe. Pt underwent abdominal aortogram with angioplasty of the left anterior tibial artery/left popliteal artery on 8/1. Pt was SOB on 8/2; also reportedly had episode of coughing/ gurgling after sipping liquids on 8/2. CXR showed increased bilateral  pulmonary interstitial opacity. Had toe amputation 8/2 as well. Bedside swallow eval ordered to further assess aspiration risk.  Type of Study: Bedside Swallow Evaluation Previous Swallow Assessment: none in chart Diet Prior to this Study: Regular;Thin liquids Temperature Spikes Noted: No Respiratory Status: Nasal cannula History of Recent Intubation: No Behavior/Cognition: Alert;Cooperative Oral Cavity Assessment: Within Functional Limits Oral Care Completed by SLP: No Oral Cavity - Dentition: Adequate natural dentition Vision: Functional for self-feeding Self-Feeding Abilities: Able to feed self Patient Positioning: Upright in bed Baseline Vocal Quality: Normal Volitional Cough: Strong Volitional Swallow: Able to elicit    Oral/Motor/Sensory Function Overall Oral Motor/Sensory Function: Within functional limits   Ice Chips Ice chips: Not tested   Thin Liquid Thin Liquid: Within functional limits Presentation: Cup;Straw    Nectar Thick Nectar Thick Liquid: Not tested   Honey Thick Honey Thick Liquid: Not tested   Puree Puree: Within functional limits   Solid   GO   Solid: Not tested        Kern Reap, Webb, CCC-SLP 11/07/2015,9:07 AM [phone]

## 2015-11-07 NOTE — Progress Notes (Signed)
Pharmacy Antibiotic Note Mark [name] is a 75 y.o. male admitted on 10/28/2015 with diabetic foot infection in setting of chronic immunosuppression for renal txp. 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 Continues on Zosyn and therapeutic vancomycin after left great toe amputation. Currently afebrile, WBC WNL. Scr continues to trend up, vancomycin trough ordered prior to tonights dose. Reorder and dose according to results.   7/27 VT = 15  8/3 VT = Pending  Plan: -Continue Zosyn 3.375 gm IV q8h (D11) -Temporally stopped Vancomycin (D11) pending VT tonight -F/U ABX plans post-surgery; cx pending  Height: 5\' 9"  (175.3 cm) Weight: 242 lb 8.1 oz (110 kg) IBW/kg (Calculated) : 70.7  Temp (24hrs), Avg:98.1 F (36.7 C), Min:97.8 F (36.6 C), Max:98.7 F (37.1 C)   Recent Labs Lab 10/31/15 2155  11/03/15 0450 11/04/15 0443 11/05/15 0532 11/06/15 0527 11/07/15 0055 11/07/15 0317  WBC  --   < > 10.0 10.7* 12.3* 9.4 9.8  --   CREATININE  --   < > 1.88* 1.99* 1.88* 2.33*  --  2.44*  VANCOTROUGH 15  --   --   --   --   --   --   --   < > = values in this interval not displayed.  Estimated Creatinine Clearance: 32 mL/min (by C-G formula based on SCr of 2.44 mg/dL).    Allergies  Allergen Reactions  . Tape Other (See Comments)    SKIN WILL TEAR!!    Antimicrobials this admission: Zosyn 7/24>> Vancomycin 7/24>>   Microbiology results: 7/24 BCx: ngtd (final) 7/31 Cdiff: neg 8/1: MRSA screen: pos 8/2: tissue: pending  Thank you for allowing pharmacy to be a part of this patient's care.  Georga Bora, PharmD Clinical Pharmacist Pager: [phone] 11/07/2015 11:00 AM

## 2015-11-07 NOTE — Progress Notes (Signed)
ANTICOAGULATION CONSULT NOTE - Follow Up Consult  Pharmacy Consult for heparin Indication: atrial fibrillation  Labs:  Recent Labs  11/04/15 0443 11/05/15 0532 11/05/15 0856 11/06/15 0527 11/06/15 1540 11/06/15 1940 11/07/15 0055  HGB 13.8 14.2  --  13.0  --   --  12.4*  HCT 43.4 43.4  --  40.0  --   --  38.4*  PLT 335 329  --  285  --   --  281  LABPROT 19.6* 18.6*  --  18.4*  --   --   --   INR 1.64 1.54  --  1.51  --   --   --   HEPARINUNFRC 0.71* 0.47  --   --   --   --  0.34  CREATININE 1.99* 1.88*  --  2.33*  --   --   --   TROPONINI  --   --  0.09*  --  0.06* 0.07*  --     Assessment/Plan:  75yo male therapeutic on heparin after resumed. Will continue gtt at current rate and confirm stable with additional level.   Wynona Neat, PharmD, BCPS  11/07/2015,1:30 AM

## 2015-11-07 NOTE — Progress Notes (Signed)
OT Cancellation Note  Patient Details Name: Mark [name] MRN: [id number] DOB: 09/29/1940   Cancelled Treatment:    Reason Eval/Treat Not Completed: Other (comment). Per PA note, therapy asked to hold off on ambulation until dressing change is completed tomorrow. Will check on pt tomorrow as schedule allows.   Redmond Baseman, OTR/L Pager: [phone] 11/07/2015, 8:49 AM

## 2015-11-07 NOTE — Progress Notes (Signed)
    Subjective:  Denies CP; dyspneic earlier improving now   Objective:  Vitals:   11/06/15 2139 11/07/15 0217 11/07/15 0223 11/07/15 0401  BP:    (!) 159/86  Pulse:    71  Resp:  (!) 32  (!) 24  Temp:    98.7 F (37.1 C)  TempSrc:    Oral  SpO2: 94% 93% 94% 96%  Weight:      Height:        Intake/Output from previous day:  Intake/Output Summary (Last 24 hours) at 11/07/15 0814 Last data filed at 11/07/15 0104  Gross per 24 hour  Intake              300 ml  Output              210 ml  Net               90 ml    Physical Exam: Physical exam: Well-developed obese in no acute distress.  Skin is warm and dry.  HEENT is normal.  Neck is supple.  Chest with diminished BS bases Cardiovascular exam is irregular Abdominal exam nontender or distended. No masses palpated.  Extremities show tra2+ edema neuro grossly intact    Lab Results: Basic Metabolic Panel:  Recent Labs  11/05/15 0930 11/06/15 0527 11/07/15 0317  NA  --  141 139  K  --  4.3 4.3  CL  --  113* 111  CO2  --  22 20*  GLUCOSE  --  83 141*  BUN  --  27* 30*  CREATININE  --  2.33* 2.44*  CALCIUM  --  8.5* 8.5*  MG 2.4  --   --    CBC:  Recent Labs  11/06/15 0527 11/07/15 0055  WBC 9.4 9.8  HGB 13.0 12.4*  HCT 40.0 38.4*  MCV 96.4 94.6  PLT 285 281   Cardiac Enzymes:  Recent Labs  11/05/15 0856 11/06/15 1540 11/06/15 1940  TROPONINI 0.09* 0.06* 0.07*     Assessment/Plan:  1 atrial fibrillation-patient remains in atrial fibrillation. He is having some aberrancy on telemetry. Continue beta blocker for rate control. Digoxin DCed. Continue heparin; resume coumadin when okay with vascular surgery. 2 chronic stage III renal insufficiency/status post renal transplant-patient was on 200 mg of Lasix twice a day prior to admission. He is positive 13 liters and is volume overloaded on exam. Begin lasix 80 mg IV BID; follow renal function; would ask nephrology to follow as Cr increasing. 3  toe gangrene-for amputation today. 4 Hypertension-continue present blood pressure medications and follow.   Kirk Ruths 11/07/2015, 8:14 AM

## 2015-11-07 NOTE — Progress Notes (Signed)
Pt still with increased work of breathing.Pt A&O; just very lethargic.Oxygen sat 90-91% on 2 L N/C. Pt denies SOB at this time. Oxygen increased to 4L; Crackles in lungs.  Baltazar Najjar paged. New order for ABG.  RRN and respiratory at bedside. Pt given PRN Xopenex.  Pt Resp now 24. Pt resting in bed. Oxygen sat 98% on 4L. Continuous pulse ox on at this time. Baltazar Najjar updated on pt. No further orders at this time.   RN will continue to monitor.  Arnell Sieving, RN

## 2015-11-07 NOTE — Telephone Encounter (Signed)
Sched appt 8/17 at 2:00. Lm on cell# to inform pt of appt.

## 2015-11-07 NOTE — Progress Notes (Addendum)
ANTICOAGULATION CONSULT NOTE - Follow Up Consult  Pharmacy Consult for Heparin Indication: atrial fibrillation  Allergies  Allergen Reactions  . Tape Other (See Comments)    SKIN WILL TEAR!!    Patient Measurements: Height: 5\' 9"  (175.3 cm) Weight: 242 lb 8.1 oz (110 kg) IBW/kg (Calculated) : 70.7  Vital Signs: Temp: 98.7 F (37.1 C) (08/03 0401) Temp Source: Oral (08/03 0401) BP: 180/57 (08/03 1037) Pulse Rate: 67 (08/03 1037)  Labs:  Recent Labs  11/05/15 0532 11/05/15 0856 11/06/15 0527 11/06/15 1540 11/06/15 1940 11/07/15 0055 11/07/15 0317 11/07/15 0921  HGB 14.2  --  13.0  --   --  12.4*  --   --   HCT 43.4  --  40.0  --   --  38.4*  --   --   PLT 329  --  285  --   --  281  --   --   LABPROT 18.6*  --  18.4*  --   --   --  20.5*  --   INR 1.54  --  1.51  --   --   --  1.73  --   HEPARINUNFRC 0.47  --   --   --   --  0.34  --  0.39  CREATININE 1.88*  --  2.33*  --   --   --  2.44*  --   TROPONINI  --  0.09*  --  0.06* 0.07*  --   --   --     Estimated Creatinine Clearance: 32 mL/min (by C-G formula based on SCr of 2.44 mg/dL).  Assessment: 75yo male with AFib and s/p toe amputation, to resume Heparin. Heparin has been therapeutic on 1500 units/hr  for the last 3 levels with most recent 0.39. Warfarin PTA, currently on hold  Goal of Therapy:  Heparin level 0.3-0.7 units/ml Monitor platelets by anticoagulation protocol: Yes   Plan:  Heparin 1500 units/hr Check Heparin level and CBC daily Watch for s/s of bleeding  Georga Bora, PharmD Clinical Pharmacist Pager: [phone] 11/07/2015 11:11 AM

## 2015-11-07 NOTE — Progress Notes (Addendum)
Patient ID: Mark [name], male   DOB: March 16, 1941, 75 y.o.   MRN: [id number]                                                                PROGRESS NOTE                                                                                                                                                                                                             Patient Demographics:    Mark [name], is a 75 y.o. male, DOB - [birth year], [id number]  Admit date - 10/28/2015   Admitting Physician Etta Quill, DO  Outpatient Primary MD for the patient is Rochel Brome, MD  LOS - 10  Outpatient Specialists:   Chief Complaint  Patient presents with  . 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 Patient presents to ED with c/o 'his toe is dead'. This onset about 1 month ago. Finally saw doctor today after he fell getting out of the shower this morning. His doctor was concerned about infection and sent him to the ED. No fever no vomiting.Toe found to begrossly infected and necrotic . Patient followed by [name], DPM .Patient underwent abdominal aortogram with angioplasty of the left anterior tibial artery/left popliteal artery on 8/1   Subjective:    Mark [name]  S/p amputation, slightly SOB    Assessment  & Plan :    Principal Problem:   Gangrene of toe ([place]) Active Problems:   Type II diabetes mellitus, uncontrolled ([place])   Renal transplant recipient   Diabetic ulcer of left great toe ([place])   Cellulitis of great toe of left foot   Aspiration of liquid   SOB (shortness of breath)   Permanent atrial fibrillation ([facility])   Bibasilar crackles   Shortness of breath, Suspect acute on chronic diastolic heart failure Likely secondary to pulmonary vascular congestion ,improved Chest x-ray shows mild congestive changes,  On lasix 80 mg IV BID , creatinine  slightly up ,  2-D echo did not show any wall motion abnormalities and the patient does not appear to have any ischemic changes on his EKG    Gangrene of left  toe -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, patient status post arteriogram to evaluate blood flow to his left foot.Situation complicated by renal transplant with elevated creatinine, held IV fluids due to shortness of breath,  Coumadin on hold and patient is currently on a heparin drip    According to [facility] and AHA guidelines and cardiology, he requires no further cardiac workup prior to his noncardiac surgery and should be at acceptable risk   Acute on Chronic kidney disease stage IV, status post renal transplant , baseline creatinine around 1.7, now 2.44 creatinine has been trending up over the past year, but improved since admission, continue gentle IV hydration, follow creatinine closely. Creatinine now trending up. Nephrology consulted to monitor patient's renal function in the setting of upcoming procedure/angiogram.Continue anti-rejection meds, appreciate [name] following along during this hospitalization, Prograf level was low and [name] increased prograf,     A fib with nsvt vs aberrancy - Holding coumadin, heparin per pharmacy, rate controlled , preop 2-D echo shows grade 2 diastolic dysfunction, pulmonary hypertension, EKG/telemetry shows A. fib rate controlled, cont digoxin, metoprolol, Cardiology consulted for preop clearance , pt has been cleared by cardiology for surgery He is having some aberrancy on telemetry. Continue beta blocker for rate control. Digoxin DCed. Continue heparin; resume coumadin when okay with vascular surgery  DM2 - Lantus reduced   to 20 units, contpre-meal NovoLog 5 units.Moderate dose mealtime and SSI AC/HS, DC D10   Hemoglobin A1c 8.2  Hypertension:  uncontrolled Continue norvasc to 10mg  po qday , metoprolol Prn hydralazine  Hypothyroidism-continue Synthroid  Protein Calorie Malnutrition prostat  Dyslipidemia-continue Crestor  Pulmonary hypertension:  Mr. Trivino has moderate pulmonary hypertension.  Recommend outpatient sleep study and PFTs.  This may also be due to left-sided heart disease. He does have grade 2 diastolic dysfunction. Recommend diuresis with stable per nephrology.  There is no evidence of liver disease by laboratory testing   Code Status : FULL CODE  Family Communication  :   Disposition Plan  :  ? Rehab vs home  Barriers For Discharge : renal function  Consults  :  Vascular surgery, nephrology, cardiology  Procedures  : Atherectomy and drug coated balloon angioplasty 8/1 , toe amputation 8/2  DVT Prophylaxis  :  Heparin     Lab Results  Component Value Date   PLT 281 11/07/2015    Antibiotics  :    Anti-infectives    Start     Dose/Rate Route Frequency Ordered Stop   11/06/15 1115  ceFAZolin (ANCEF) IVPB 1 g/50 mL premix  Status:  Discontinued    Comments:  Send with pt to OR   1 g 100 mL/hr over 30 Minutes Intravenous On call 11/05/15 2346 11/06/15 1632   10/29/15 2200  vancomycin (VANCOCIN) IVPB 1000 mg/200 mL premix  Status:  Discontinued     1,000 mg 200 mL/hr over 60 Minutes Intravenous Every 24 hours 10/28/15 2154 11/07/15 1058   10/28/15 2200  piperacillin-tazobactam (ZOSYN) IVPB 3.375 g     3.375 g 12.5 mL/hr over 240 Minutes Intravenous Every 8 hours 10/28/15 2120     10/28/15 2200  vancomycin (VANCOCIN) 2,500 mg in sodium chloride 0.9 % 500 mL IVPB     2,500 mg 250 mL/hr over 120 Minutes Intravenous  Once 10/28/15 2154 10/29/15 2330        Objective:  Vitals:   11/07/15 0217 11/07/15 0223 11/07/15 0401 11/07/15 1037  BP:   (!) 159/86 (!) 180/57  Pulse:   71 67  Resp: (!) 32  (!) 24   Temp:   98.7 F (37.1 C)   TempSrc:   Oral   SpO2: 93% 94% 96%   Weight:        Height:        Wt Readings from Last 3 Encounters:  11/06/15 110 kg (242 lb 8.1 oz)  09/05/15 103.1 kg (227 lb 3.2 oz)  08/05/15 101.8 kg (224 lb 6.4 oz)     Intake/Output Summary (Last 24 hours) at 11/07/15 1348 Last data filed at 11/07/15 0900  Gross per 24 hour  Intake              375 ml  Output              210 ml  Net              165 ml     Physical Exam  Awake Alert, Oriented X 3, No new F.N deficits, Normal affect [place].AT,PERRAL Supple Neck,No JVD, No cervical lymphadenopathy appriciated.  Symmetrical Chest wall movement, Good air movement bilaterally, CTAB RRR,No Gallops,Rubs or new Murmurs, No Parasternal Heave +ve B.Sounds, Abd Soft, No tenderness, No organomegaly appriciated, No rebound - guarding or rigidity. No edema.  1st left foot black.    Data Review:    CBC  Recent Labs Lab 11/03/15 0450 11/04/15 0443 11/05/15 0532 11/06/15 0527 11/07/15 0055  WBC 10.0 10.7* 12.3* 9.4 9.8  HGB 13.3 13.8 14.2 13.0 12.4*  HCT 40.9 43.4 43.4 40.0 38.4*  PLT 297 335 329 285 281  MCV 93.4 95.2 95.2 96.4 94.6  MCH 30.4 30.3 31.1 31.3 30.5  MCHC 32.5 31.8 32.7 32.5 32.3  RDW 14.6 15.0 15.2 15.6* 15.5    Chemistries   Recent Labs Lab 11/02/15 0257 11/03/15 0450 11/04/15 0443 11/05/15 0532 11/05/15 0930 11/06/15 0527 11/07/15 0317  NA 136 140 139 140  --  141 139  K 3.5 3.8 4.0 4.8  --  4.3 4.3  CL 105 114* 113* 113*  --  113* 111  CO2 25 22 21* 20*  --  22 20*  GLUCOSE 119* 108* 156* 86  --  83 141*  BUN 29* 29* 30* 27*  --  27* 30*  CREATININE 1.89* 1.88* 1.99* 1.88*  --  2.33* 2.44*  CALCIUM 8.4* 8.6* 8.8* 9.0  --  8.5* 8.5*  MG 2.1  --   --   --  2.4  --   --   AST 19 21 26  40  --  26 25  ALT 14* 14* 16* 21  --  18 17  ALKPHOS 57 67 88 102  --  85 87  BILITOT 0.9 1.1 1.0 1.3*  --  0.8 0.9   ------------------------------------------------------------------------------------------------------------------ No results for input(s): CHOL, HDL,  LDLCALC, TRIG, CHOLHDL, LDLDIRECT in the last 72 hours.  Lab Results  Component Value Date   HGBA1C 8.2 (H) 10/29/2015   ------------------------------------------------------------------------------------------------------------------ No results for input(s): TSH, T4TOTAL, T3FREE, THYROIDAB in the last 72 hours.  Invalid input(s): FREET3 ------------------------------------------------------------------------------------------------------------------ No results for input(s): VITAMINB12, FOLATE, FERRITIN, TIBC, IRON, RETICCTPCT in the last 72 hours.  Coagulation profile  Recent Labs Lab 11/03/15 0450 11/04/15 0443 11/05/15 0532 11/06/15 0527 11/07/15 0317  INR 1.87 1.64 1.54 1.51 1.73    No results for input(s): DDIMER in the last 72 hours.  Cardiac Enzymes  Recent Labs Lab 11/05/15 0856 11/06/15 1540 11/06/15 1940  TROPONINI 0.09* 0.06* 0.07*   ------------------------------------------------------------------------------------------------------------------ No results found for: BNP  Inpatient Medications  Scheduled Meds: . allopurinol  150 mg Oral Daily  . amLODipine  10 mg Oral Daily  . aspirin  81 mg Oral Daily  . calcitRIOL  0.5 mcg Oral Daily  . Chlorhexidine Gluconate Cloth  6 each Topical Daily  . docusate sodium  100 mg Oral Daily  . famotidine  20 mg Oral QHS  . feeding supplement (GLUCERNA SHAKE)  237 mL Oral Q1500  . feeding supplement (PRO-STAT SUGAR FREE 64)  30 mL Oral BID BM  . furosemide  80 mg Intravenous BID  . insulin aspart  0-15 Units Subcutaneous TID WC  . insulin aspart  5 Units Subcutaneous TID WC  . insulin glargine  20 Units Subcutaneous QHS  . levothyroxine  137 mcg Oral QAC breakfast  . magnesium oxide  400 mg Oral BID  . metoprolol succinate  100 mg Oral Daily  .  morphine injection  2 mg Intravenous Once  . mupirocin ointment  1 application Nasal BID  . phosphorus  250 mg Oral BID  . piperacillin-tazobactam (ZOSYN)  IV  3.375  g Intravenous Q8H  . potassium chloride  20 mEq Oral BID  . predniSONE  5 mg Oral Daily  . rosuvastatin  10 mg Oral QPM  . tacrolimus  1 mg Oral BID   Continuous Infusions: . dextrose 25 mL/hr at 11/06/15 0030  . heparin 1,500 Units/hr (11/07/15 1037)   PRN Meds:.acetaminophen, alum & mag hydroxide-simeth, guaiFENesin-dextromethorphan, hydrALAZINE, labetalol, levalbuterol, metoprolol, ondansetron (ZOFRAN) IV, phenol  Micro Results Recent Results (from the past 240 hour(s))  Blood Cultures x 2 sites     Status: None   Collection Time: 10/28/15 10:00 PM  Result Value Ref Range Status   Specimen Description BLOOD RIGHT FOREARM  Final   Special Requests BOTTLES DRAWN AEROBIC AND ANAEROBIC 4CC  Final   Culture NO GROWTH 5 DAYS  Final   Report Status 11/02/2015 FINAL  Final  Blood Cultures x 2 sites     Status: None   Collection Time: 10/28/15 11:04 PM  Result Value Ref Range Status   Specimen Description BLOOD LEFT ARM  Final   Special Requests AEROBIC BOTTLE ONLY 7ML  Final   Culture NO GROWTH 5 DAYS  Final   Report Status 11/02/2015 FINAL  Final  C difficile quick scan w PCR reflex     Status: None   Collection Time: 11/04/15  9:42 AM  Result Value Ref Range Status   C Diff antigen NEGATIVE NEGATIVE Final   C Diff toxin NEGATIVE NEGATIVE Final   C Diff interpretation No C. difficile detected.  Final  Surgical PCR screen     Status: Abnormal   Collection Time: 11/05/15 11:18 PM  Result Value Ref Range Status   MRSA, PCR NEGATIVE NEGATIVE Final   Staphylococcus aureus POSITIVE (A) NEGATIVE Final    Comment:        The Xpert SA Assay (FDA approved for NASAL specimens in patients over 67 years of age), is one component of a comprehensive surveillance program.  Test performance has been validated by [facility] for patients greater than or equal to 3 year old. It is not intended to diagnose infection nor to guide or monitor treatment.   Aerobic Culture (superficial  specimen)     Status: None (Preliminary result)   Collection Time: 11/06/15  2:33 PM  Result Value Ref Range Status   Specimen Description WOUND LEFT TOE  Final   Special Requests GREAT TOE POF VANC AND ZOSYN  Final   Gram Stain   Final    NO WBC SEEN FEW SQUAMOUS EPITHELIAL CELLS PRESENT FEW GRAM POSITIVE COCCI IN CLUSTERS    Culture CULTURE REINCUBATED FOR BETTER GROWTH  Final   Report Status PENDING  Incomplete    Radiology Reports Dg Chest Port 1 View  Result Date: 11/06/2015 CLINICAL DATA:  75 year old male with increasing shortness of breath and abnormal left side pulmonary auscultation. Initial encounter. EXAM: PORTABLE CHEST 1 VIEW COMPARISON:  0508 hours today and earlier. FINDINGS: Portable AP semi upright view at 2127 hours. Continued and mildly increased perihilar and basilar predominant interstitial pulmonary opacity. Stable cardiac size and mediastinal contours. No pneumothorax. No pleural effusion or consolidation identified. IMPRESSION: Increased bilateral pulmonary interstitial opacity. Top differential considerations include progression of interstitial edema versus viral/ atypical respiratory infection. No pleural effusion identified. Electronically Signed   By: Genevie Ann M.D.   On: 11/06/2015 21:42   Dg Chest Port 1 View  Result Date: 11/06/2015 CLINICAL DATA:  75 year old male with aspiration of liquid. EXAM: PORTABLE CHEST 1 VIEW COMPARISON:  Chest radiograph dated 11/05/2015 FINDINGS: Mild cardiomegaly with central vascular congestion. Bibasilar Interstitial prominence and nodular densities, new from prior study may represent atelectatic changes versus infiltrate. There is no pleural effusion or pneumothorax. Top-normal cardiac silhouette. No acute osseous pathology. IMPRESSION: Mild congestive changes with bibasilar subsegmental atelectasis versus infiltrate. Electronically Signed   By: Anner Crete M.D.   On: 11/06/2015 05:16   Dg Chest Port 1 View  Result Date:  11/05/2015 CLINICAL DATA:  Shortness of breath EXAM: PORTABLE CHEST 1 VIEW COMPARISON:  March 14, 2015 FINDINGS: The lungs are clear. Heart is mildly enlarged with pulmonary vascularity within normal limits. No adenopathy. No bone lesions. IMPRESSION: Mild cardiac enlargement.  No edema or consolidation. Electronically Signed   By: Lowella Grip III M.D.   On: 11/05/2015 09:42   Dg Foot Complete Left  Result Date: 10/28/2015 CLINICAL DATA:  Left big toe pain. EXAM: LEFT FOOT - COMPLETE 3+ VIEW COMPARISON:  None. FINDINGS: There is no evidence of fracture or dislocation. No evidence of dystrophic bony changes. There is soft tissue swelling of the distal first digit. Punctate lucencies within the area of soft tissue swelling may represent entrapped fat or potentially foci of gas. Heavy vascular calcifications are seen. IMPRESSION: No evidence of fracture of the left foot. Soft tissue swelling of the distal first digit with punctate lucencies within the area of swelling, which may represent entrapped fat or potentially foci of gas. Please correlate clinically regarding the possibility of cellulitis. Electronically Signed   By: Fidela Salisbury M.D.   On: 10/28/2015 21:01   Time Spent in minutes  2   Tatianna Ibbotson M.D on 11/07/2015 at 1:48 PM  Between 7am to 7pm - Pager - [phone]  After 7pm go to [email or web address] - password [facility]  [facility] -  Office  [phone]

## 2015-11-07 NOTE — Care Management Important Message (Signed)
Important Message  Patient Details  Name: Mark [name] MRN: [id number] Date of Birth: 20-May-1940   Medicare Important Message Given:  Yes    [name] Abena 11/07/2015, 10:12 AM

## 2015-11-08 DIAGNOSIS — T17998S Other foreign object in respiratory tract, part unspecified causing other injury, sequela: Secondary | ICD-10-CM

## 2015-11-08 LAB — CBC
HCT: 39.4 % (ref 39.0–52.0)
Hemoglobin: 12.9 g/dL — ABNORMAL LOW (ref 13.0–17.0)
MCH: 30.9 pg (ref 26.0–34.0)
MCHC: 32.7 g/dL (ref 30.0–36.0)
MCV: 94.5 fL (ref 78.0–100.0)
PLATELETS: 298 10*3/uL (ref 150–400)
RBC: 4.17 MIL/uL — AB (ref 4.22–5.81)
RDW: 15.4 % (ref 11.5–15.5)
WBC: 8.9 10*3/uL (ref 4.0–10.5)

## 2015-11-08 LAB — GLUCOSE, CAPILLARY
GLUCOSE-CAPILLARY: 147 mg/dL — AB (ref 65–99)
Glucose-Capillary: 105 mg/dL — ABNORMAL HIGH (ref 65–99)
Glucose-Capillary: 158 mg/dL — ABNORMAL HIGH (ref 65–99)
Glucose-Capillary: 172 mg/dL — ABNORMAL HIGH (ref 65–99)

## 2015-11-08 LAB — COMPREHENSIVE METABOLIC PANEL
ALBUMIN: 2.1 g/dL — AB (ref 3.5–5.0)
ALT: 16 U/L — AB (ref 17–63)
AST: 21 U/L (ref 15–41)
Alkaline Phosphatase: 93 U/L (ref 38–126)
Anion gap: 9 (ref 5–15)
BUN: 37 mg/dL — AB (ref 6–20)
CHLORIDE: 109 mmol/L (ref 101–111)
CO2: 21 mmol/L — AB (ref 22–32)
CREATININE: 2.52 mg/dL — AB (ref 0.61–1.24)
Calcium: 8.7 mg/dL — ABNORMAL LOW (ref 8.9–10.3)
GFR calc Af Amer: 27 mL/min — ABNORMAL LOW (ref >60)
GFR, EST NON AFRICAN AMERICAN: 23 mL/min — AB (ref >60)
GLUCOSE: 130 mg/dL — AB (ref 65–99)
Potassium: 4.2 mmol/L (ref 3.5–5.1)
Sodium: 139 mmol/L (ref 135–145)
Total Bilirubin: 0.9 mg/dL (ref 0.3–1.2)
Total Protein: 6 g/dL — ABNORMAL LOW (ref 6.5–8.1)

## 2015-11-08 LAB — VANCOMYCIN, RANDOM: Vancomycin Rm: 23

## 2015-11-08 LAB — AEROBIC CULTURE W GRAM STAIN (SUPERFICIAL SPECIMEN)

## 2015-11-08 LAB — AEROBIC CULTURE  (SUPERFICIAL SPECIMEN): GRAM STAIN: NONE SEEN

## 2015-11-08 LAB — ACID FAST SMEAR (AFB, MYCOBACTERIA)

## 2015-11-08 LAB — ACID FAST SMEAR (AFB): ACID FAST SMEAR - AFSCU2: NEGATIVE

## 2015-11-08 LAB — HEPARIN LEVEL (UNFRACTIONATED): HEPARIN UNFRACTIONATED: 0.39 [IU]/mL (ref 0.30–0.70)

## 2015-11-08 LAB — PROTIME-INR
INR: 1.69
PROTHROMBIN TIME: 20.1 s — AB (ref 11.4–15.2)

## 2015-11-08 MED ORDER — DEXTROSE 5 % IV SOLN
120.0000 mg | Freq: Two times a day (BID) | INTRAVENOUS | Status: DC
  Filled 2015-11-08 (×2): qty 12

## 2015-11-08 MED ORDER — FUROSEMIDE 10 MG/ML IJ SOLN
160.0000 mg | Freq: Two times a day (BID) | INTRAVENOUS | Status: DC
  Administered 2015-11-08 – 2015-11-09 (×2): 160 mg via INTRAVENOUS
  Filled 2015-11-08 (×3): qty 16

## 2015-11-08 MED ORDER — FUROSEMIDE 10 MG/ML IJ SOLN
60.0000 mg | Freq: Once | INTRAMUSCULAR | Status: AC
  Administered 2015-11-08: 60 mg via INTRAVENOUS
  Filled 2015-11-08: qty 6

## 2015-11-08 MED ORDER — HYDRALAZINE HCL 25 MG PO TABS
25.0000 mg | ORAL_TABLET | Freq: Three times a day (TID) | ORAL | Status: DC
  Administered 2015-11-08 – 2015-11-24 (×44): 25 mg via ORAL
  Filled 2015-11-08 (×44): qty 1

## 2015-11-08 MED ORDER — WARFARIN - PHARMACIST DOSING INPATIENT
Freq: Every day | Status: DC
  Administered 2015-11-09: 18:00:00

## 2015-11-08 MED ORDER — WARFARIN SODIUM 7.5 MG PO TABS
7.5000 mg | ORAL_TABLET | Freq: Once | ORAL | Status: AC
  Administered 2015-11-08: 7.5 mg via ORAL
  Filled 2015-11-08: qty 1

## 2015-11-08 MED ORDER — HYDROCODONE-ACETAMINOPHEN 5-325 MG PO TABS
1.0000 | ORAL_TABLET | Freq: Four times a day (QID) | ORAL | Status: DC | PRN
  Administered 2015-11-09 – 2015-11-13 (×7): 1 via ORAL
  Filled 2015-11-08 (×7): qty 1

## 2015-11-08 NOTE — Progress Notes (Signed)
Occupational Therapy Treatment Patient Details Name: Mark [name] MRN: [id number] DOB: 13-Dec-1940 Today's Date: 11/08/2015    History of present illness 75 y.o. male s/p L great toe amputation. PMH significant HTN, afib on Coumadin, ESRD s/p kidney tranplant 12 years ago, and DM type II.   OT comments  Pt reported that he had not mobilized in several days and post-op shoe had not arrived in room. Educated pt on weight bearing through L heel only and pt able to complete stand-pivot transfer to chair with min assist and completed grooming tasks sitting with setup provided. Discussed importance of mobilizing OOB and educated pt to call nursing to assist with this over the weekend. POC and discharge disposition remain appropriate. Continue to wait for post-op shoe. Will continue to follow acutely.   Follow Up Recommendations  SNF;Supervision/Assistance - 24 hour    Equipment Recommendations  Other (comment) (TBD at next venue)    Recommendations for Other Services      Precautions / Restrictions Precautions Precautions: Fall Precaution Comments: History of peripheral neuropathy. Pt states he can't really feel his feet. Restrictions Weight Bearing Restrictions: Yes LLE Weight Bearing: Touchdown weight bearing (thru heel only)       Mobility Bed Mobility Overal bed mobility: Needs Assistance Bed Mobility: Supine to Sit     Supine to sit: Min assist     General bed mobility comments: Min assist for trunk support to come to sitting position. VCs for hand placement.  Transfers Overall transfer level: Needs assistance Equipment used: Rolling walker (2 wheeled) Transfers: Sit to/from [facility] Sit to Stand: Min assist Stand pivot transfers: Min assist       General transfer comment: Min assist to stabilize balance to stand and while taking pivotal steps to chair. VCs for [place] status and hand placement    Balance Overall balance assessment: Needs  assistance Sitting-balance support: No upper extremity supported;Feet supported Sitting balance-Leahy Scale: Good     Standing balance support: Bilateral upper extremity supported;During functional activity Standing balance-Leahy Scale: Poor Standing balance comment: Reliant on UE support for balance to maintain WB'ing status                   ADL Overall ADL's : Needs assistance/impaired Eating/Feeding: Set up;Sitting   Grooming: Wash/dry hands;Wash/dry face;Oral care;Set up;Sitting   Upper Body Bathing: Set up;Sitting   Lower Body Bathing: Maximal assistance;Sit to/from stand   Upper Body Dressing : Set up;Sitting   Lower Body Dressing: Maximal assistance;Sit to/from stand   Toilet Transfer: Minimal assistance;Cueing for safety;Stand-pivot;RW Toilet Transfer Details (indicate cue type and reason): simulated from bed to chair Toileting- Clothing Manipulation and Hygiene: Maximal assistance;Sit to/from stand       Functional mobility during ADLs: Minimal assistance;Rolling walker General ADL Comments: Educated on importance of mobilizing OOB and calling for nursing to assist with this. Encouraged to use incentive spirometer 10 times/hour      Vision                     Perception     Praxis      Cognition   Behavior During Therapy: [facility] for tasks assessed/performed Overall Cognitive Status: Within Functional Limits for tasks assessed                       Extremity/Trunk Assessment               Exercises     Shoulder Instructions  General Comments      Pertinent Vitals/ Pain       Pain Assessment: Faces Faces Pain Scale: Hurts a little bit Pain Location: LLE Pain Descriptors / Indicators: Aching Pain Intervention(s): Limited activity within patient's tolerance;Monitored during session;Repositioned  Home Living                                          Prior Functioning/Environment               Frequency Min 2X/week     Progress Toward Goals  OT Goals(current goals can now be found in the care plan section)  Progress towards OT goals: Progressing toward goals  Acute Rehab OT Goals Patient Stated Goal: go home, not a rest home OT Goal Formulation: With patient Time For Goal Achievement: 11/22/15 Potential to Achieve Goals: Fair ADL Goals Pt Will Perform Grooming: with set-up;with supervision;standing Pt Will Perform Lower Body Bathing: with min assist;sitting/lateral leans;sit to/from stand Pt Will Perform Lower Body Dressing: with mod assist;with min assist;sitting/lateral leans;sit to/from stand Pt Will Transfer to Toilet: with supervision;ambulating;grab bars Pt Will Perform Toileting - Clothing Manipulation and hygiene: with min guard assist;sit to/from stand  Plan Discharge plan remains appropriate    Co-evaluation                 End of Session Equipment Utilized During Treatment: Gait belt;Rolling walker   Activity Tolerance Patient limited by fatigue   Patient Left in chair;with call bell/phone within reach;with chair alarm set   Nurse Communication Mobility status        Time: [id number] OT Time Calculation (min): 43 min  Charges: OT General Charges $OT Visit: 1 Procedure OT Treatments $Self Care/Home Management : 38-52 mins  Redmond Baseman, OTR/L Pager: [phone] 11/08/2015, 4:26 PM

## 2015-11-08 NOTE — [place] FL2 (Signed)
[place] MEDICAID FL2 LEVEL OF CARE SCREENING TOOL     IDENTIFICATION  Patient Name: Mark [name] Birthdate: 09-Sep-1940 Sex: male Admission Date (Current Location): 10/28/2015  [facility] and [place] Number:  Herbalist and Address:  [facility] [place], [place] [address], [place], [place] [address]      Provider Number: [id number]  Attending Physician Name and Address:  Reyne Dumas, MD  Relative Name and Phone Number:       Current Level of Care: Hospital Recommended Level of Care: [place] Prior Approval Number:    Date Approved/Denied:   PASRR Number: [id number]  Discharge Plan: SNF    Current Diagnoses: Patient Active Problem List   Diagnosis Date Noted  . Bibasilar crackles   . Aspiration of liquid   . SOB (shortness of breath)   . Permanent atrial fibrillation ([place])   . Renal transplant recipient 10/28/2015  . Diabetic ulcer of left great toe ([place]) 10/28/2015  . Gangrene of toe ([place]) 10/28/2015  . Cellulitis of great toe of left foot 10/28/2015  . Blister of foot with infection 02/22/2015  . Diabetic peripheral neuropathy ([place]) 06/12/2014  . Hammer toe of right foot 02/16/2014  . Onychomycosis 02/02/2014  . Pain in lower limb 02/02/2014  . Ulcer of right foot ([place]) 01/19/2014  . Ulcer of right ankle ([place]) 01/08/2014  . Ulcer of other part of foot 12/15/2013  . Type II diabetes mellitus, uncontrolled ([place]) 10/20/2012  . Other and unspecified hyperlipidemia 10/20/2012  . Chronic kidney disease, stage III (moderate) 10/20/2012  . Unspecified hypothyroidism 10/20/2012  . Edema extremities 10/20/2012  . Gout 10/20/2012    Orientation RESPIRATION BLADDER Height & Weight     Self, Time, Situation, Place  O2 (2.5 L/ min) Continent Weight: 242 lb 8.1 oz (110 kg) Height:  5\' 9"  (175.3 cm)  BEHAVIORAL SYMPTOMS/MOOD NEUROLOGICAL BOWEL NUTRITION STATUS   (none)  (None) Continent  (Renal and Carb modified)  AMBULATORY  STATUS COMMUNICATION OF NEEDS Skin   Limited Assist Verbally Surgical wounds (Incision Closed: LT Foot. Wound Diabetic Ulcer LT Great Toe)                       Personal Care Assistance Level of Assistance  Bathing, Feeding, Dressing Bathing Assistance: Limited assistance Feeding assistance: Independent Dressing Assistance: Limited assistance     Functional Limitations Info  Sight, Hearing, Speech Sight Info: Adequate Hearing Info: Adequate Speech Info: Adequate    SPECIAL CARE FACTORS FREQUENCY  PT (By licensed PT), OT (By licensed OT)     PT Frequency: 5/ week OT Frequency: 5/ week            Contractures Contractures Info: Not present    Additional Factors Info  Insulin Sliding Scale Code Status Info: FULL Allergies Info: Tape   Insulin Sliding Scale Info: insulin aspart (novoLOG) injection 0-15 Units Dose: 0-15 Units Freq: 3 times daily with meals Route: [place], insulin aspart (novoLOG) injection 5 Units Dose: 5 Units Freq: 3 times daily with meals Route:        Current Medications (11/08/2015):  This is the current hospital active medication list Current Facility-Administered Medications  Medication Dose Route Frequency Provider Last Rate Last Dose  . acetaminophen (TYLENOL) tablet 500 mg  500 mg Oral Q6H PRN Etta Quill, DO   500 mg at 11/04/15 2216  . allopurinol (ZYLOPRIM) tablet 150 mg  150 mg Oral Daily Etta Quill, DO  150 mg at 11/08/15 1056  . alum & mag hydroxide-simeth (MAALOX/MYLANTA) 200-200-20 MG/5ML suspension 15-30 mL  15-30 mL Oral Q2H PRN Serafina Mitchell, MD      . amLODipine (NORVASC) tablet 10 mg  10 mg Oral Daily Skeet Latch, MD   10 mg at 11/08/15 1054  . aspirin chewable tablet 81 mg  81 mg Oral Daily Serafina Mitchell, MD   81 mg at 11/08/15 1056  . calcitRIOL (ROCALTROL) capsule 0.5 mcg  0.5 mcg Oral Daily Etta Quill, DO   0.5 mcg at 11/08/15 1055  . Chlorhexidine Gluconate Cloth 2 % PADS 6 each  6 each Topical Daily Serafina Mitchell, MD   6 each at 11/08/15 [id number]  . dextrose 10 % infusion   Intravenous Continuous Reyne Dumas, MD 25 mL/hr at 11/06/15 0030    . docusate sodium (COLACE) capsule 100 mg  100 mg Oral Daily Serafina Mitchell, MD   100 mg at 11/08/15 1053  . famotidine (PEPCID) tablet 20 mg  20 mg Oral QHS Mauricia Area, MD   20 mg at 11/07/15 2212  . feeding supplement (GLUCERNA SHAKE) (GLUCERNA SHAKE) liquid 237 mL  237 mL Oral Q1500 Reyne Dumas, MD   237 mL at 11/03/15 1452  . feeding supplement (PRO-STAT SUGAR FREE 64) liquid 30 mL  30 mL Oral BID BM Reyne Dumas, MD   30 mL at 11/03/15 1452  . furosemide (LASIX) 120 mg in dextrose 5 % 50 mL IVPB  120 mg Intravenous BID Lelon Perla, MD      . guaiFENesin-dextromethorphan [facility] DM) 100-10 MG/5ML syrup 15 mL  15 mL Oral Q4H PRN Serafina Mitchell, MD      . heparin ADULT infusion 100 units/mL (25000 units/246mL sodium chloride 0.45%)  1,500 Units/hr Intravenous Continuous Jaquita Folds, RPH 15 mL/hr at 11/08/15 0358 1,500 Units/hr at 11/08/15 0358  . hydrALAZINE (APRESOLINE) injection 10 mg  10 mg Intravenous Q4H PRN Gardiner Barefoot, NP   10 mg at 11/05/15 1842  . hydrALAZINE (APRESOLINE) tablet 25 mg  25 mg Oral Q8H Lelon Perla, MD   25 mg at 11/08/15 1055  . insulin aspart (novoLOG) injection 0-15 Units  0-15 Units Subcutaneous TID WC Etta Quill, DO   8 Units at 11/07/15 1736  . insulin aspart (novoLOG) injection 5 Units  5 Units Subcutaneous TID WC Reyne Dumas, MD   Stopped at 11/05/15 2018  . insulin glargine (LANTUS) injection 20 Units  20 Units Subcutaneous QHS Reyne Dumas, MD   20 Units at 11/07/15 2217  . labetalol (NORMODYNE,TRANDATE) injection 10 mg  10 mg Intravenous Q10 min PRN Serafina Mitchell, MD   10 mg at 11/08/15 0423  . levalbuterol (XOPENEX) nebulizer solution 1.25 mg  1.25 mg Nebulization Q6H PRN Reyne Dumas, MD   1.25 mg at 11/08/15 1127  . levothyroxine (SYNTHROID, LEVOTHROID) tablet 137 mcg  137 mcg Oral QAC  breakfast Etta Quill, DO   137 mcg at 11/08/15 1054  . magnesium oxide (MAG-OX) tablet 400 mg  400 mg Oral BID Etta Quill, DO   400 mg at 11/08/15 1054  . metoprolol (LOPRESSOR) injection 2-5 mg  2-5 mg Intravenous Q2H PRN Serafina Mitchell, MD      . metoprolol succinate (TOPROL-XL) 24 hr tablet 100 mg  100 mg Oral Daily Mauricia Area, MD   100 mg at 11/08/15 1056  . morphine 2 MG/ML injection 2 mg  2 mg  Intravenous Once Wandra Mannan, MD   Stopped at 11/06/15 [phone]  . mupirocin ointment (BACTROBAN) 2 % 1 application  1 application Nasal BID Serafina Mitchell, MD   1 application at [id number] [phone]  . ondansetron (ZOFRAN) injection 4 mg  4 mg Intravenous Q6H PRN Jani Gravel, MD      . phenol [facility]) mouth spray 1 spray  1 spray Mouth/Throat PRN Serafina Mitchell, MD      . phosphorus (K PHOS NEUTRAL) tablet 250 mg  250 mg Oral BID Etta Quill, DO   250 mg at 11/08/15 1054  . piperacillin-tazobactam (ZOSYN) IVPB 3.375 g  3.375 g Intravenous Q8H Wynell Balloon, RPH 12.5 mL/hr at 11/08/15 0753 3.375 g at 11/08/15 0753  . potassium chloride SA (K-DUR,KLOR-CON) CR tablet 20 mEq  20 mEq Oral BID Jani Gravel, MD   20 mEq at 11/08/15 1053  . predniSONE (DELTASONE) tablet 5 mg  5 mg Oral Daily Etta Quill, DO   5 mg at 11/08/15 1055  . rosuvastatin (CRESTOR) tablet 10 mg  10 mg Oral QPM Etta Quill, DO   10 mg at 11/07/15 1735  . tacrolimus (PROGRAF) capsule 1 mg  1 mg Oral BID Mauricia Area, MD   1 mg at 11/08/15 1055     Discharge Medications: Please see discharge summary for a list of discharge medications.  Relevant Imaging Results:  Relevant Lab Results:   Additional Information SSN: [id number]  Samule Dry, LCSW

## 2015-11-08 NOTE — Progress Notes (Signed)
RN called- Patient doesn't want Morphine, he takes Hydrocodone Q4 hours at home. Will resume hydrocodone, d/c morphine

## 2015-11-08 NOTE — Progress Notes (Signed)
    Subjective:  Denies CP; dyspnea improved   Objective:  Vitals:   11/07/15 1438 11/07/15 2000 11/08/15 0405 11/08/15 0445  BP: (!) 142/65 (!) 155/84 (!) 165/83 (!) 157/68  Pulse: 70 62 72   Resp: 20 20 20    Temp: 97.8 F (36.6 C) 97.6 F (36.4 C) 97.7 F (36.5 C)   TempSrc: Oral Oral Oral   SpO2: 97% 99% 95%   Weight:      Height:        Intake/Output from previous day:  Intake/Output Summary (Last 24 hours) at 11/08/15 0838 Last data filed at 11/08/15 0245  Gross per 24 hour  Intake              195 ml  Output              250 ml  Net              -55 ml    Physical Exam: Physical exam: Well-developed obese in no acute distress.  Skin is warm and dry.  HEENT is normal.  Neck is supple.  Chest with diminished BS bases Cardiovascular exam is irregular Abdominal exam nontender or distended. No masses palpated.  Extremities show 2+ edema neuro grossly intact    Lab Results: Basic Metabolic Panel:  Recent Labs  11/05/15 0930 11/06/15 0527 11/07/15 0317  NA  --  141 139  K  --  4.3 4.3  CL  --  113* 111  CO2  --  22 20*  GLUCOSE  --  83 141*  BUN  --  27* 30*  CREATININE  --  2.33* 2.44*  CALCIUM  --  8.5* 8.5*  MG 2.4  --   --    CBC:  Recent Labs  11/06/15 0527 11/07/15 0055  WBC 9.4 9.8  HGB 13.0 12.4*  HCT 40.0 38.4*  MCV 96.4 94.6  PLT 285 281   Cardiac Enzymes:  Recent Labs  11/05/15 0856 11/06/15 1540 11/06/15 1940  TROPONINI 0.09* 0.06* 0.07*     Assessment/Plan:  1 atrial fibrillation-patient remains in atrial fibrillation. Continue beta blocker for rate control. Digoxin DCed. Continue heparin; resume coumadin when okay with vascular surgery. 2 chronic stage III renal insufficiency/status post renal transplant-patient was on 200 mg of Lasix twice a day prior to admission. He is positive 13 liters and is volume overloaded on exam. I/O -55. Change lasix to 120 mg IV BID; follow renal function; would ask nephrology to  follow. 3 toe gangrene-for amputation today. 4 Hypertension-BP elevated; add hydralazine 25 TID and follow.   Kirk Ruths 11/08/2015, 8:38 AM

## 2015-11-08 NOTE — Progress Notes (Signed)
PT Cancellation Note  Patient Details Name: Mark [name] MRN: [id number] DOB: 1940/07/06   Cancelled Treatment:    Reason Eval/Treat Not Completed: Medical issues which prohibited therapy.  Still awaiting his post-op shoe for mobility although bandage is changed.  Heel wbing only in shoe is permitted.   Ramond Dial 11/08/2015, 2:06 PM    Mee Hives, PT MS Acute Rehab Dept. Number: [place] and [place]

## 2015-11-08 NOTE — Progress Notes (Signed)
  Progress Note    11/08/2015 11:27 AM 2 Days Post-Op  Subjective:  "I don't have any feeling in that toe"  Afebrile 60's AFib [id number] systolic [id number]  Vitals:   11/08/15 0405 11/08/15 0445  BP: (!) 165/83 (!) 157/68  Pulse: 72   Resp: 20   Temp: 97.7 F (36.5 C)     Physical Exam: Incisions:  Left great toe site with bloody drainage.  No evidence of infection.    CBC    Component Value Date/Time   WBC 9.8 11/07/2015 0055   RBC 4.06 (L) 11/07/2015 0055   HGB 12.4 (L) 11/07/2015 0055   HCT 38.4 (L) 11/07/2015 0055   PLT 281 11/07/2015 0055   MCV 94.6 11/07/2015 0055   MCH 30.5 11/07/2015 0055   MCHC 32.3 11/07/2015 0055   RDW 15.5 11/07/2015 0055   LYMPHSABS 1.9 10/28/2015 1919   MONOABS 0.9 10/28/2015 1919   EOSABS 0.0 10/28/2015 1919   BASOSABS 0.0 10/28/2015 1919    BMET    Component Value Date/Time   NA 139 11/07/2015 0317   K 4.3 11/07/2015 0317   CL 111 11/07/2015 0317   CO2 20 (L) 11/07/2015 0317   GLUCOSE 141 (H) 11/07/2015 0317   BUN 30 (H) 11/07/2015 0317   CREATININE 2.44 (H) 11/07/2015 0317   CALCIUM 8.5 (L) 11/07/2015 0317   GFRNONAA 24 (L) 11/07/2015 0317   GFRAA 28 (L) 11/07/2015 0317    INR    Component Value Date/Time   INR 1.73 11/07/2015 0317     Intake/Output Summary (Last 24 hours) at 11/08/15 1127 Last data filed at 11/08/15 1044  Gross per 24 hour  Intake              120 ml  Output              500 ml  Net             -380 ml     Assessment:  75 y.o. male is s/p:  Left great toe amputation   2 Days Post-Op  Plan: -pt's surgical site looks okay-okay from vascular at this point to restart coumadin. -continue ABx. -continue bid wet to dry saline dressing changes -DVT prophylaxis:  Heparin/coumadin bridge   Leontine Locket, PA-C [facility] [phone] 11/08/2015 11:27 AM

## 2015-11-08 NOTE — Clinical Social Work Note (Signed)
Clinical Social Work Assessment  Patient Details  Name: Mark [name] MRN: [id number] Date of Birth: December 16, 1940  Date of referral:  11/08/15               Reason for consult:  Discharge Planning                Permission sought to share information with:  Chartered certified accountant granted to share information::  Yes, Verbal Permission Granted  Name::     Mark [name],Mark [name]  Agency::  SNFs  Relationship::  Daughter  Contact Information:     Housing/Transportation Living arrangements for the past 2 months:  Single Family Home Source of Information:  Patient Patient Interpreter Needed:  None Criminal Activity/Legal Involvement Pertinent to Current Situation/Hospitalization:  No - Comment as needed Significant Relationships:  Adult Children Lives with:  Adult Children Do you feel safe going back to the place where you live?  Yes Need for family participation in patient care:  Yes (Comment)  Care giving concerns:  The patient is aggregable for short term rehab at discharge. Patient would like to rebuild his strength to return home.   Social Worker assessment / plan:  CSW met with patient at beside to complete assessment. Patient was resting comfortably in bed. CSW explained PT recommendation for SNF placement. CSW explained SNF search and placement process to the patient and answered  his questions. Patient reported his support as his daughter Mark [name] Paula Libra.  CSW will follow up with bed offers.   Employment status:    Insurance information:  Medicare PT Recommendations:  [place] / Referral to community resources:  [place]  Patient/Family's Response to care: The patient appears happy with the care he is receiving in hospital and is appreciative of CSW assistance.  Patient/Family's Understanding of and Emotional Response to Diagnosis, Current Treatment, and Prognosis:  The patient has a good understanding of why he was admitted.  He understands the care plan and what he  will need post discharge.  Emotional Assessment Appearance:  Appears stated age Attitude/Demeanor/Rapport:   (Patient was appropriate) Affect (typically observed):  Accepting, Appropriate, Calm Orientation:  Oriented to Self, Oriented to Place, Oriented to  Time, Oriented to Situation Alcohol / Substance use:  Not Applicable Psych involvement (Current and /or in the community):  No (Comment)  Discharge Needs  Concerns to be addressed:  Discharge Planning Concerns Readmission within the last 30 days:  No Current discharge risk:  Physical Impairment Barriers to Discharge:  Continued Medical Work up   [employer], LCSW 11/08/2015, 5:08 PM

## 2015-11-08 NOTE — Consult Note (Signed)
[facility] Consult Note Reason for Consult: renal transplant, AKI Referring Physician: Allyson Sabal HPI:  Mark [name] is an 75 y.o. male.  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  Creatinine baseline appears to be 1.8-2.3.  Primary nephrologist is Dr. Justin Mend.  Saw him on 06/26/2015 with BUN 27, creatinine 1.88.  Admitted on 10/28/2015 with necrotic left great toe with cellulitis.  Creatinine on admission was 2.6.  Seen by [name] in consult on 7/25.  His Prograf level was found to be low and was increased.  Patient underwent abdominal aortogram with angioplasty of the left anterior tibial artery and left popliteal artery on 11/05/2015.  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  He was on Vancomycin and Zosyn from 7/24-8/3.  Currently only on Zosyn.  He developed some dyspnea in the setting of being positive about 13 liters since admission with evidence of volume overload on exam.  He was on high dose of Lasix at home prior to admission (29m BID with occasional metolzaone).  Cardiology has increased his Lasix to 1227mIV BID.  His creatinine has increased from 2.6 on admission down to 1.88 on August 1, now back up to 2.52 this morning.  Nephrology has been asked to see patient in setting of kidney transplant and increasing creatinine.   PMH:   Past Medical History:  Diagnosis Date  . Atrial fibrillation ([place]  . Diabetes ([place]  . Gout   . Hypertension   . Hypothyroidism   . Renal disorder    kidney transplant   . Ulcer of other part of foot 12/15/2013    Review of Systems  Constitutional: Negative for chills and fever.  Respiratory: Positive for sputum production and shortness of breath (improving). Negative for cough.   Cardiovascular: Positive for leg swelling. Negative for chest pain.  Gastrointestinal: Negative for nausea and vomiting.  All  other systems reviewed and are negative.   Blood pressure (!) 158/82, pulse 79, temperature 97.7 F (36.5 C), temperature source Axillary, resp. rate 18, height _0  (1.753 m), weight 242 lb 8.1 oz (110 kg), SpO2 93 %.  Physical Exam  Constitutional: He is oriented to person, place, and time.  Very pleasant, sitting up in chair, on [place]  HENT:  Head: Normocephalic and atraumatic.  Eyes: EOM are normal.  Cardiovascular:  Irregularly irregular rhythm.  Respiratory: Effort normal.  Diminished breath sounds.  Genitourinary:  Genitourinary Comments: Catheter in place, draining clear, yellow urine  Musculoskeletal: He exhibits edema (2+ edema in bilateral lower extremities, edema in upper extremities.).  Left foot wrapped in clean Ace bandage, not taken down to examine wound.  Neurological: He is alert and oriented to person, place, and time. No cranial nerve deficit.  Psychiatric: He has a normal mood and affect. Judgment normal.    Creatinine, [name]  Date/Time Value Ref Range Status  11/08/2015 10:55 AM 2.52 (H) 0.61 - 1.24 mg/dL Final  11/07/2015 03:17 AM 2.44 (H) 0.61 - 1.24 mg/dL Final  11/06/2015 05:27 AM 2.33 (H) 0.61 - 1.24 mg/dL Final  11/05/2015 05:32 AM 1.88 (H) 0.61 - 1.24 mg/dL Final  11/04/2015 04:43 AM 1.99 (H) 0.61 - 1.24 mg/dL Final  11/03/2015 04:50 AM 1.88 (H) 0.61 - 1.24 mg/dL Final  11/02/2015 02:57 AM 1.89 (H) 0.61 - 1.24 mg/dL Final  11/01/2015 06:28 AM 1.72 (H) 0.61 -  1.24 mg/dL Final  10/31/2015 04:30 AM 1.85 (H) 0.61 - 1.24 mg/dL Final  10/30/2015 03:58 AM 2.14 (H) 0.61 - 1.24 mg/dL Final  10/30/2015 03:58 AM 1.99 (H) 0.61 - 1.24 mg/dL Final  10/29/2015 06:31 AM 2.30 (H) 0.61 - 1.24 mg/dL Final  10/28/2015 07:19 PM 2.60 (H) 0.61 - 1.24 mg/dL Final  06/06/2015 03:37 PM 2.15 (H) 0.40 - 1.50 mg/dL Final  01/24/2015 01:31 PM 1.89 (H) 0.40 - 1.50 mg/dL Final  10/17/2014 10:01 AM 1.76 (H) 0.40 - 1.50 mg/dL Final  03/16/2014 09:20 AM 3.5 (H) 0.4 - 1.5 mg/dL Final   12/18/2013 08:42 AM 2.2 (H) 0.4 - 1.5 mg/dL Final  05/04/2013 08:11 AM 2.1 (H) 0.4 - 1.5 mg/dL Final  10/18/2012 08:57 AM 1.7 (H) 0.4 - 1.5 mg/dL Final    Results for orders placed or performed during the hospital encounter of 10/28/15 (from the past 48 hour(s))  Glucose, capillary     Status: None   Collection Time: 11/06/15  3:21 PM  Result Value Ref Range   Glucose-Capillary 94 65 - 99 mg/dL  Troponin I     Status: Abnormal   Collection Time: 11/06/15  3:40 PM  Result Value Ref Range   Troponin I 0.06 (HH) <0.03 ng/mL    Comment: CRITICAL VALUE NOTED.  VALUE IS CONSISTENT WITH PREVIOUSLY REPORTED AND CALLED VALUE.  Glucose, capillary     Status: None   Collection Time: 11/06/15  4:50 PM  Result Value Ref Range   Glucose-Capillary 90 65 - 99 mg/dL   Comment 1 Notify RN    Comment 2 Document in Chart   Troponin I     Status: Abnormal   Collection Time: 11/06/15  7:40 PM  Result Value Ref Range   Troponin I 0.07 (HH) <0.03 ng/mL    Comment: CRITICAL VALUE NOTED.  VALUE IS CONSISTENT WITH PREVIOUSLY REPORTED AND CALLED VALUE.  Glucose, capillary     Status: Abnormal   Collection Time: 11/06/15  8:11 PM  Result Value Ref Range   Glucose-Capillary 187 (H) 65 - 99 mg/dL  Heparin level (unfractionated)     Status: None   Collection Time: 11/07/15 12:55 AM  Result Value Ref Range   Heparin Unfractionated 0.34 0.30 - 0.70 IU/mL    Comment:        IF HEPARIN RESULTS ARE BELOW EXPECTED VALUES, AND PATIENT DOSAGE HAS BEEN CONFIRMED, SUGGEST FOLLOW UP TESTING OF ANTITHROMBIN III LEVELS.   CBC     Status: Abnormal   Collection Time: 11/07/15 12:55 AM  Result Value Ref Range   WBC 9.8 4.0 - 10.5 K/uL   RBC 4.06 (L) 4.22 - 5.81 MIL/uL   Hemoglobin 12.4 (L) 13.0 - 17.0 g/dL   HCT 38.4 (L) 39.0 - 52.0 %   MCV 94.6 78.0 - 100.0 fL   MCH 30.5 26.0 - 34.0 pg   MCHC 32.3 30.0 - 36.0 g/dL   RDW 15.5 11.5 - 15.5 %   Platelets 281 150 - 400 K/uL  Blood gas, arterial     Status:  Abnormal   Collection Time: 11/07/15  2:35 AM  Result Value Ref Range   FIO2 0.36    Delivery systems NASAL CANNULA    pH, Arterial 7.408 7.350 - 7.450   pCO2 arterial 30.9 (L) 35.0 - 45.0 mmHg   pO2, Arterial 71.1 (L) 80.0 - 100.0 mmHg   Bicarbonate 19.1 (L) 20.0 - 24.0 mEq/L   TCO2 20.1 0 - 100 mmol/L   Acid-base deficit  4.7 (H) 0.0 - 2.0 mmol/L   O2 Saturation 94.4 %   Patient temperature 98.6    Collection site LEFT RADIAL    Drawn by [id number]    Sample type ARTERIAL DRAW    Allens test (pass/fail) PASS PASS  Protime-INR     Status: Abnormal   Collection Time: 11/07/15  3:17 AM  Result Value Ref Range   Prothrombin Time 20.5 (H) 11.4 - 15.2 seconds   INR 1.73   Comprehensive metabolic panel     Status: Abnormal   Collection Time: 11/07/15  3:17 AM  Result Value Ref Range   Sodium 139 135 - 145 mmol/L   Potassium 4.3 3.5 - 5.1 mmol/L   Chloride 111 101 - 111 mmol/L   CO2 20 (L) 22 - 32 mmol/L   Glucose, Bld 141 (H) 65 - 99 mg/dL   BUN 30 (H) 6 - 20 mg/dL   Creatinine, [name] 2.44 (H) 0.61 - 1.24 mg/dL   Calcium 8.5 (L) 8.9 - 10.3 mg/dL   Total Protein 5.8 (L) 6.5 - 8.1 g/dL   Albumin 2.0 (L) 3.5 - 5.0 g/dL   AST 25 15 - 41 U/L   ALT 17 17 - 63 U/L   Alkaline Phosphatase 87 38 - 126 U/L   Total Bilirubin 0.9 0.3 - 1.2 mg/dL   GFR calc non Af Amer 24 (L) >60 mL/min   GFR calc Af Amer 28 (L) >60 mL/min    Comment: (NOTE) The eGFR has been calculated using the CKD EPI equation. This calculation has not been validated in all clinical situations. eGFR's persistently <60 mL/min signify possible Chronic Kidney Disease.    Anion gap 8 5 - 15  Glucose, capillary     Status: Abnormal   Collection Time: 11/07/15  6:41 AM  Result Value Ref Range   Glucose-Capillary 124 (H) 65 - 99 mg/dL   Comment 1 Notify RN   Heparin level (unfractionated)     Status: None   Collection Time: 11/07/15  9:21 AM  Result Value Ref Range   Heparin Unfractionated 0.39 0.30 - 0.70 IU/mL     Comment:        IF HEPARIN RESULTS ARE BELOW EXPECTED VALUES, AND PATIENT DOSAGE HAS BEEN CONFIRMED, SUGGEST FOLLOW UP TESTING OF ANTITHROMBIN III LEVELS.   Glucose, capillary     Status: Abnormal   Collection Time: 11/07/15 11:42 AM  Result Value Ref Range   Glucose-Capillary 172 (H) 65 - 99 mg/dL  Glucose, capillary     Status: Abnormal   Collection Time: 11/07/15  4:25 PM  Result Value Ref Range   Glucose-Capillary 296 (H) 65 - 99 mg/dL   Comment 1 Notify RN   Glucose, capillary     Status: Abnormal   Collection Time: 11/07/15  9:57 PM  Result Value Ref Range   Glucose-Capillary 172 (H) 65 - 99 mg/dL  Glucose, capillary     Status: Abnormal   Collection Time: 11/08/15  6:47 AM  Result Value Ref Range   Glucose-Capillary 105 (H) 65 - 99 mg/dL  Heparin level (unfractionated)     Status: None   Collection Time: 11/08/15 10:55 AM  Result Value Ref Range   Heparin Unfractionated 0.39 0.30 - 0.70 IU/mL    Comment:        IF HEPARIN RESULTS ARE BELOW EXPECTED VALUES, AND PATIENT DOSAGE HAS BEEN CONFIRMED, SUGGEST FOLLOW UP TESTING OF ANTITHROMBIN III LEVELS.   CBC     Status: Abnormal  Collection Time: 11/08/15 10:55 AM  Result Value Ref Range   WBC 8.9 4.0 - 10.5 K/uL   RBC 4.17 (L) 4.22 - 5.81 MIL/uL   Hemoglobin 12.9 (L) 13.0 - 17.0 g/dL   HCT 39.4 39.0 - 52.0 %   MCV 94.5 78.0 - 100.0 fL   MCH 30.9 26.0 - 34.0 pg   MCHC 32.7 30.0 - 36.0 g/dL   RDW 15.4 11.5 - 15.5 %   Platelets 298 150 - 400 K/uL  Comprehensive metabolic panel     Status: Abnormal   Collection Time: 11/08/15 10:55 AM  Result Value Ref Range   Sodium 139 135 - 145 mmol/L   Potassium 4.2 3.5 - 5.1 mmol/L   Chloride 109 101 - 111 mmol/L   CO2 21 (L) 22 - 32 mmol/L   Glucose, Bld 130 (H) 65 - 99 mg/dL   BUN 37 (H) 6 - 20 mg/dL   Creatinine, [name] 2.52 (H) 0.61 - 1.24 mg/dL   Calcium 8.7 (L) 8.9 - 10.3 mg/dL   Total Protein 6.0 (L) 6.5 - 8.1 g/dL   Albumin 2.1 (L) 3.5 - 5.0 g/dL   AST 21 15 - 41  U/L   ALT 16 (L) 17 - 63 U/L   Alkaline Phosphatase 93 38 - 126 U/L   Total Bilirubin 0.9 0.3 - 1.2 mg/dL   GFR calc non Af Amer 23 (L) >60 mL/min   GFR calc Af Amer 27 (L) >60 mL/min    Comment: (NOTE) The eGFR has been calculated using the CKD EPI equation. This calculation has not been validated in all clinical situations. eGFR's persistently <60 mL/min signify possible Chronic Kidney Disease.    Anion gap 9 5 - 15  Protime-INR     Status: Abnormal   Collection Time: 11/08/15 10:55 AM  Result Value Ref Range   Prothrombin Time 20.1 (H) 11.4 - 15.2 seconds   INR 1.69   Glucose, capillary     Status: Abnormal   Collection Time: 11/08/15 11:29 AM  Result Value Ref Range   Glucose-Capillary 147 (H) 65 - 99 mg/dL   Comment 1 Notify RN    Comment 2 Document in Chart   Vancomycin, random     Status: None   Collection Time: 11/08/15  2:07 PM  Result Value Ref Range   Vancomycin Rm 23     Comment:        Random Vancomycin therapeutic range is dependent on dosage and time of specimen collection. A peak range is 20.0-40.0 ug/mL A trough range is 5.0-15.0 ug/mL           Scheduled Meds: . allopurinol  150 mg Oral Daily  . amLODipine  10 mg Oral Daily  . aspirin  81 mg Oral Daily  . calcitRIOL  0.5 mcg Oral Daily  . Chlorhexidine Gluconate Cloth  6 each Topical Daily  . docusate sodium  100 mg Oral Daily  . famotidine  20 mg Oral QHS  . feeding supplement (GLUCERNA SHAKE)  237 mL Oral Q1500  . feeding supplement (PRO-STAT SUGAR FREE 64)  30 mL Oral BID BM  . furosemide  120 mg Intravenous BID  . hydrALAZINE  25 mg Oral Q8H  . insulin aspart  0-15 Units Subcutaneous TID WC  . insulin aspart  5 Units Subcutaneous TID WC  . insulin glargine  20 Units Subcutaneous QHS  . levothyroxine  137 mcg Oral QAC breakfast  . magnesium oxide  400 mg Oral BID  .  metoprolol succinate  100 mg Oral Daily  .  morphine injection  2 mg Intravenous Once  . mupirocin ointment  1 application  Nasal BID  . phosphorus  250 mg Oral BID  . piperacillin-tazobactam (ZOSYN)  IV  3.375 g Intravenous Q8H  . potassium chloride  20 mEq Oral BID  . predniSONE  5 mg Oral Daily  . rosuvastatin  10 mg Oral QPM  . tacrolimus  1 mg Oral BID   PRN Meds: acetaminophen, alum & mag hydroxide-simeth, guaiFENesin-dextromethorphan, hydrALAZINE, labetalol, levalbuterol, metoprolol, ondansetron (ZOFRAN) IV, phenol  Infusion Meds: . dextrose 25 mL/hr at 11/06/15 0030  . heparin 1,500 Units/hr (11/08/15 0358)     Dg Chest Port 1 View  Result Date: 11/06/2015 CLINICAL DATA:  75 year old male with increasing shortness of breath and abnormal left side pulmonary auscultation. Initial encounter. EXAM: PORTABLE CHEST 1 VIEW COMPARISON:  0508 hours today and earlier. FINDINGS: Portable AP semi upright view at 2127 hours. Continued and mildly increased perihilar and basilar predominant interstitial pulmonary opacity. Stable cardiac size and mediastinal contours. No pneumothorax. No pleural effusion or consolidation identified. IMPRESSION: Increased bilateral pulmonary interstitial opacity. Top differential considerations include progression of interstitial edema versus viral/ atypical respiratory infection. No pleural effusion identified. Electronically Signed   By: Genevie Ann M.D.   On: 11/06/2015 21:42   Background: 75 y.o. male.  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  Creatine baseline appears to be 1.8-2.3.  Primary nephrologist is Dr. Justin Mend.  Saw him on 06/26/2015 with BUN 27, creatinine 1.88.  Admitted on 10/28/2015 with necrotic left great toe with cellulitis.  Creatinine on admission was 2.6.  Seen by [name] in consult on 7/25.  Creatinine had been improving until last couple days when started to trend back up post aortogram and in setting of being up 13 liters since  admission.  Assessment/Plan:  Renal: Patient is status post kidney transplant in 2005 at [facility].  Seen in consult earlier this admission by [name].  His primary nephrologist is Dr. Justin Mend.  His Prograf level was found to be low earlier this admission and dose was increased.   His creatinine was improving earlier in the admission with his outpatient baseline being around 1.8-2.3.  Over the past couple days, creatinine has started to trend upward since August 1 and today was 2.52.  Timing of increased creatinine coincides with aortogram on August 1.  No electrolyte abnormalities at this point.  At admission, his FeNa was 0.7%, indicating pre-renal etiology of his acute kidney injury.    He is on calcitriol 0.59mg daily, phosphorous 2577mBID, and K-dur 2041mBID.  PTH on 7/25 was 82 - recheck Prograf level per Dr. DetSalvadore Domcs from last week. - continue following renal function closely as I wonder how much of his current rise in creatinine may be due to aortogram. -Increase furosemide to 160 BID (and can add metolazone if not adequate)  HTN/Volume:  BP elevated in setting of volume overload.  Hydralazine TID added in addition to amlodipine and Lasix.  Cardiology following.  DM  AFib: BB for rate control, on heparin, coumadin per [facility]  Gangrene/Cellulitis/Toe Amputation: on zosyn.  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  Had abdominal aortogram with angioplasty of the left anterior tibial artery and left popliteal artery on 11/05/2015.  [facility] following.  Peripheral Vascular Disease  HLD  Hypothyroidism  Mark [name] 11/08/2015, 3:05 PM   I have seen and examined this patient and agree with plan and assessment in the above note with renal recommendations/intervention highlighted. Pt with renal transplant since 2005, baseline creatinine around 2, with AKI on CKD in the setting of contrast exposure from aortogram, vancomycin (though no high levels documented), and now volume overload/CHF. Cannot exclude  component of atheroembolic RF (post instrumentation). First order of business is diuresis - recommend increasing furosemide to 160 mg Q12H. Metolazone can be added if needed but haven't done that just yet.  Will repeat a prograf level (dose increased last week) and attempt to get a trough level. Other recs as above.  Mark Grange B,MD 11/08/2015 5:07 PM

## 2015-11-08 NOTE — Progress Notes (Addendum)
PT Cancellation Note  Patient Details Name: Mark [name] MRN: [id number] DOB: 12/09/1940   Cancelled Treatment:    Reason Eval/Treat Not Completed: Medical issues which prohibited therapy.  Pt is awaiting bandaging and shoe delivery so will try later as time and pt allow.  Ramond Dial 11/08/2015, 9:07 AM    Mee Hives, PT MS Acute Rehab Dept. Number: [place]

## 2015-11-08 NOTE — Clinical Social Work Placement (Signed)
   CLINICAL SOCIAL WORK PLACEMENT  NOTE  Date:  11/08/2015  Patient Details  Name: Mark [name] MRN: [id number] Date of Birth: 08/12/1940  Clinical Social Work is seeking post-discharge placement for this patient at the [place] level of care (*CSW will initial, date and re-position this form in  chart as items are completed):  Yes   Patient/family provided with [facility] Work Department's list of facilities offering this level of care within the geographic area requested by the patient (or if unable, by the patient's family).  Yes   Patient/family informed of their freedom to choose among providers that offer the needed level of care, that participate in Medicare, Medicaid or managed care program needed by the patient, have an available bed and are willing to accept the patient.  Yes   Patient/family informed of [facility]'s ownership interest in [facility] and [facility], as well as of the fact that they are under no obligation to receive care at these facilities.  PASRR submitted to EDS on 11/08/15     PASRR number received on 11/08/15     Existing PASRR number confirmed on       FL2 transmitted to all facilities in geographic area requested by pt/family on 11/08/15     FL2 transmitted to all facilities within larger geographic area on       Patient informed that his/her managed care company has contracts with or will negotiate with certain facilities, including the following:            Patient/family informed of bed offers received.  Patient chooses bed at       Physician recommends and patient chooses bed at      Patient to be transferred to   on  .  Patient to be transferred to facility by       Patient family notified on   of transfer.  Name of family member notified:        PHYSICIAN Please prepare prescriptions, Please prepare priority discharge summary, including medications, Please sign FL2     Additional  Comment:    _______________________________________________ Samule Dry, LCSW 11/08/2015, 5:13 PM

## 2015-11-08 NOTE — Progress Notes (Addendum)
ANTICOAGULATION CONSULT NOTE - Follow Up Consult  Pharmacy Consult for Heparin/Coumadin + Vancomycin Indication: afib + L foot gangrene  Allergies  Allergen Reactions  . Tape Other (See Comments)    SKIN WILL TEAR!!    Patient Measurements: Height: 5\' 9"  (175.3 cm) Weight: 242 lb 8.1 oz (110 kg) IBW/kg (Calculated) : 70.7  Vital Signs: Temp: 97.7 F (36.5 C) (08/04 1400) Temp Source: Axillary (08/04 1400) BP: 158/82 (08/04 1400) Pulse Rate: 79 (08/04 1400)  Labs:  Recent Labs  11/06/15 0527 11/06/15 1540 11/06/15 1940 11/07/15 0055 11/07/15 0317 11/07/15 0921 11/08/15 1055  HGB 13.0  --   --  12.4*  --   --  12.9*  HCT 40.0  --   --  38.4*  --   --  39.4  PLT 285  --   --  281  --   --  298  LABPROT 18.4*  --   --   --  20.5*  --  20.1*  INR 1.51  --   --   --  1.73  --  1.69  HEPARINUNFRC  --   --   --  0.34  --  0.39 0.39  CREATININE 2.33*  --   --   --  2.44*  --  2.52*  TROPONINI  --  0.06* 0.07*  --   --   --   --     Estimated Creatinine Clearance: 31 mL/min (by C-G formula based on SCr of 2.52 mg/dL).   Assessment: 75 yo m presents after fall getting out of shower and primary MD stent pt to ED with diabetic foot infection. Toe found to be grossly infected and necrotic    AntiCoag: Warfarin PTA for afib. S/P arteriogram 8/1 with successful arthrectomy and balloon angioplasty. Heparin restarted following left great toe amputation 8/2. Hgb 12.4 continues to slowly decline. HL 0.39 in goal.  Left great toe site with bloody drainage. Vascular ok to start Coumadin. --Warfarin PTA dosing: 5mg  daily except 2.5mg  on Tuesdays and Thursdays.   - Paged vascular about starting Coumadin>>said attending needs to order it>>text paged Abrol. CSR  ID: Vanc + Zosyn for gangrene/cellulitis of toe (L foot).  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 Vascular surgery consulted- continuing ABX. S/p left great toe amputation 8/2. Currently afebrile, WBC WNL. Vanco  trough last PM d/c'd (?) and no dose since 8/2 PM.ncrease in SCr today - 1.88>2.44>2.52.   Zosyn 7/24 >> Vancomycin 7/24 >> Vanc discontinued 8/3 at 1058 AM  7/24 BCx: ngtd (final) 7/31 Cdiff: neg 8/1: MRSA screen: pos 8/2: Fungus culture:   7/27 VT = 15 - no change 8/4 VR = 23  Goal of Therapy:  INR 2-3 Monitor platelets by anticoagulation protocol: Yes  Vancomycin trough 15-20.   Plan:  -Heparin 1500 units/hr. Start Coumadin? -Hold Vanco. Vanco random level in AM. Redose if <10. -Continue Zosyn 3.375 gm IV q8h  --1615: Dr. Allyson Sabal said ok to order Coumadin per pharmacy starting tonight. Ordered Coumadin 7.5mg  po x 1 tonight. Daily INR   Mark [name], PharmD, BCPS Clinical Staff Pharmacist Pager [phone]  Mark [name] 11/08/2015,2:55 PM

## 2015-11-08 NOTE — Progress Notes (Signed)
Patient ID: Mark [name], male   DOB: 1941-04-01, 75 y.o.   MRN: [id number]                                                                PROGRESS NOTE                                                                                                                                                                                                             Patient Demographics:    Mark [name], is a 75 y.o. male, DOB - November 05, 1940, [id number]  Admit date - 10/28/2015   Admitting Physician Etta Quill, DO  Outpatient Primary MD for the patient is Rochel Brome, MD  LOS - 11  Outpatient Specialists:   Chief Complaint  Patient presents with  . 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 Patient presents to ED with c/o 'his toe is dead'. This onset about 1 month ago. Finally saw doctor today after he fell getting out of the shower this morning. His doctor was concerned about infection and sent him to the ED. No fever no vomiting.Toe found to begrossly infected and necrotic . Patient followed by [name], DPM .Patient underwent abdominal aortogram with angioplasty of the left anterior tibial artery/left popliteal artery on 8/1   Subjective:    Mark [name]  S/p amputation, very  SOB , no cp    Assessment  & Plan :    Principal Problem:   Gangrene of toe ([place]) Active Problems:   Type II diabetes mellitus, uncontrolled ([place])   Renal transplant recipient   Diabetic ulcer of left great toe ([place])   Cellulitis of great toe of left foot   Aspiration of liquid   SOB (shortness of breath)   Permanent atrial fibrillation ([facility])   Bibasilar crackles   Shortness of breath, Suspect acute on chronic diastolic heart failure Likely secondary to pulmonary vascular congestion ,improved Chest x-ray shows mild congestive changes,  On lasix  Dose increased to 120 mg  IV BID , creatinine slightly up ,SOB today  2-D echo did not show any wall motion abnormalities and the patient does not appear to have any ischemic changes  on his EKG    Gangrene of left  toe -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, patient status post arteriogram to evaluate blood flow to his left foot.Situation complicated by renal transplant with elevated creatinine, held IV fluids due to shortness of breath,  Coumadin on hold and patient is currently on a heparin drip    According to [facility] and AHA guidelines and cardiology, he requires no further cardiac workup prior to his noncardiac surgery and should be at acceptable risk   Acute on Chronic kidney disease stage IV, status post renal transplant , baseline creatinine around 1.7, now 2.44 creatinine has been trending up over the past year, but improved since admission, continue gentle IV hydration, follow creatinine closely. Creatinine now trending up. Nephrology consulted to monitor patient's renal function in the setting of upcoming procedure/angiogram.Continue anti-rejection meds, appreciate [name] following along during this hospitalization, Prograf level was low and [name] increased prograf,   Labs from this am pending    A fib with nsvt vs aberrancy - Holding coumadin, heparin per pharmacy, rate controlled , preop 2-D echo shows grade 2 diastolic dysfunction, pulmonary hypertension, EKG/telemetry shows A. fib rate controlled, cont digoxin, metoprolol, Cardiology consulted for preop clearance , pt has been cleared by cardiology for surgery He is having some aberrancy on telemetry. Continue beta blocker for rate control. Digoxin DCed. Continue heparin; resume coumadin per  vascular surgery  DM2 - Lantus reduced   to 20 units, contpre-meal NovoLog 5 units.Moderate dose mealtime and SSI AC/HS, DC D10     Hemoglobin A1c 8.2  Hypertension: uncontrolled Continue norvasc to 10mg  po qday , metoprolol Prn hydralazine  Hypothyroidism-continue Synthroid  Protein Calorie Malnutrition prostat  Dyslipidemia-continue Crestor  Pulmonary hypertension:  Mr. Mcnelis has moderate pulmonary hypertension.  Recommend outpatient sleep study and PFTs.  This may also be due to left-sided heart disease. He does have grade 2 diastolic dysfunction. Recommend diuresis with stable per nephrology.  There is no evidence of liver disease by laboratory testing   Code Status : FULL CODE  Family Communication  :   Disposition Plan  :  ? Rehab vs home  Barriers For Discharge : renal function  Consults  :  Vascular surgery, nephrology, cardiology  Procedures  : Atherectomy and drug coated balloon angioplasty 8/1 , toe amputation 8/2  DVT Prophylaxis  :  Heparin     Lab Results  Component Value Date   PLT 281 11/07/2015    Antibiotics  :    Anti-infectives    Start     Dose/Rate Route Frequency Ordered Stop   11/06/15 1115  ceFAZolin (ANCEF) IVPB 1 g/50 mL premix  Status:  Discontinued    Comments:  Send with pt to OR   1 g 100 mL/hr over 30 Minutes Intravenous On call 11/05/15 2346 11/06/15 1632   10/29/15 2200  vancomycin (VANCOCIN) IVPB 1000 mg/200 mL premix  Status:  Discontinued     1,000 mg 200 mL/hr over 60 Minutes Intravenous Every 24 hours 10/28/15 2154 11/07/15 1058   10/28/15 2200  piperacillin-tazobactam (ZOSYN) IVPB 3.375 g     3.375 g 12.5 mL/hr over 240 Minutes Intravenous Every 8 hours 10/28/15 2120     10/28/15 2200  vancomycin (VANCOCIN) 2,500 mg in sodium chloride 0.9 % 500 mL IVPB     2,500 mg 250 mL/hr over 120 Minutes  Intravenous  Once 10/28/15 2154 10/29/15 2330        Objective:   Vitals:   11/07/15 1438 11/07/15 2000 11/08/15 0405 11/08/15 0445  BP: (!) 142/65 (!) 155/84 (!) 165/83 (!) 157/68  Pulse: 70 62 72   Resp: 20 20 20    Temp: 97.8 F (36.6 C) 97.6 F  (36.4 C) 97.7 F (36.5 C)   TempSrc: Oral Oral Oral   SpO2: 97% 99% 95%   Weight:      Height:        Wt Readings from Last 3 Encounters:  11/06/15 110 kg (242 lb 8.1 oz)  09/05/15 103.1 kg (227 lb 3.2 oz)  08/05/15 101.8 kg (224 lb 6.4 oz)     Intake/Output Summary (Last 24 hours) at 11/08/15 1213 Last data filed at 11/08/15 1044  Gross per 24 hour  Intake              120 ml  Output              500 ml  Net             -380 ml     Physical Exam  Awake Alert, Oriented X 3, No new F.N deficits, Normal affect [place].AT,PERRAL Supple Neck,No JVD, No cervical lymphadenopathy appriciated.  Symmetrical Chest wall movement, Good air movement bilaterally, CTAB RRR,No Gallops,Rubs or new Murmurs, No Parasternal Heave +ve B.Sounds, Abd Soft, No tenderness, No organomegaly appriciated, No rebound - guarding or rigidity. No edema.  1st left foot black.    Data Review:    CBC  Recent Labs Lab 11/03/15 0450 11/04/15 0443 11/05/15 0532 11/06/15 0527 11/07/15 0055  WBC 10.0 10.7* 12.3* 9.4 9.8  HGB 13.3 13.8 14.2 13.0 12.4*  HCT 40.9 43.4 43.4 40.0 38.4*  PLT 297 335 329 285 281  MCV 93.4 95.2 95.2 96.4 94.6  MCH 30.4 30.3 31.1 31.3 30.5  MCHC 32.5 31.8 32.7 32.5 32.3  RDW 14.6 15.0 15.2 15.6* 15.5    Chemistries   Recent Labs Lab 11/02/15 0257 11/03/15 0450 11/04/15 0443 11/05/15 0532 11/05/15 0930 11/06/15 0527 11/07/15 0317  NA 136 140 139 140  --  141 139  K 3.5 3.8 4.0 4.8  --  4.3 4.3  CL 105 114* 113* 113*  --  113* 111  CO2 25 22 21* 20*  --  22 20*  GLUCOSE 119* 108* 156* 86  --  83 141*  BUN 29* 29* 30* 27*  --  27* 30*  CREATININE 1.89* 1.88* 1.99* 1.88*  --  2.33* 2.44*  CALCIUM 8.4* 8.6* 8.8* 9.0  --  8.5* 8.5*  MG 2.1  --   --   --  2.4  --   --   AST 19 21 26  40  --  26 25  ALT 14* 14* 16* 21  --  18 17  ALKPHOS 57 67 88 102  --  85 87  BILITOT 0.9 1.1 1.0 1.3*  --  0.8 0.9    ------------------------------------------------------------------------------------------------------------------ No results for input(s): CHOL, HDL, LDLCALC, TRIG, CHOLHDL, LDLDIRECT in the last 72 hours.  Lab Results  Component Value Date   HGBA1C 8.2 (H) 10/29/2015   ------------------------------------------------------------------------------------------------------------------ No results for input(s): TSH, T4TOTAL, T3FREE, THYROIDAB in the last 72 hours.  Invalid input(s): FREET3 ------------------------------------------------------------------------------------------------------------------ No results for input(s): VITAMINB12, FOLATE, FERRITIN, TIBC, IRON, RETICCTPCT in the last 72 hours.  Coagulation profile  Recent Labs Lab 11/03/15 0450 11/04/15 0443 11/05/15 0532 11/06/15 0527 11/07/15 [id number]  INR 1.87 1.64 1.54 1.51 1.73    No results for input(s): DDIMER in the last 72 hours.  Cardiac Enzymes  Recent Labs Lab 11/05/15 0856 11/06/15 1540 11/06/15 1940  TROPONINI 0.09* 0.06* 0.07*   ------------------------------------------------------------------------------------------------------------------ No results found for: BNP  Inpatient Medications  Scheduled Meds: . allopurinol  150 mg Oral Daily  . amLODipine  10 mg Oral Daily  . aspirin  81 mg Oral Daily  . calcitRIOL  0.5 mcg Oral Daily  . Chlorhexidine Gluconate Cloth  6 each Topical Daily  . docusate sodium  100 mg Oral Daily  . famotidine  20 mg Oral QHS  . feeding supplement (GLUCERNA SHAKE)  237 mL Oral Q1500  . feeding supplement (PRO-STAT SUGAR FREE 64)  30 mL Oral BID BM  . furosemide  120 mg Intravenous BID  . hydrALAZINE  25 mg Oral Q8H  . insulin aspart  0-15 Units Subcutaneous TID WC  . insulin aspart  5 Units Subcutaneous TID WC  . insulin glargine  20 Units Subcutaneous QHS  . levothyroxine  137 mcg Oral QAC breakfast  . magnesium oxide  400 mg Oral BID  . metoprolol succinate   100 mg Oral Daily  .  morphine injection  2 mg Intravenous Once  . mupirocin ointment  1 application Nasal BID  . phosphorus  250 mg Oral BID  . piperacillin-tazobactam (ZOSYN)  IV  3.375 g Intravenous Q8H  . potassium chloride  20 mEq Oral BID  . predniSONE  5 mg Oral Daily  . rosuvastatin  10 mg Oral QPM  . tacrolimus  1 mg Oral BID   Continuous Infusions: . dextrose 25 mL/hr at 11/06/15 0030  . heparin 1,500 Units/hr (11/08/15 0358)   PRN Meds:.acetaminophen, alum & mag hydroxide-simeth, guaiFENesin-dextromethorphan, hydrALAZINE, labetalol, levalbuterol, metoprolol, ondansetron (ZOFRAN) IV, phenol  Micro Results Recent Results (from the past 240 hour(s))  C difficile quick scan w PCR reflex     Status: None   Collection Time: 11/04/15  9:42 AM  Result Value Ref Range Status   C Diff antigen NEGATIVE NEGATIVE Final   C Diff toxin NEGATIVE NEGATIVE Final   C Diff interpretation No C. difficile detected.  Final  Surgical PCR screen     Status: Abnormal   Collection Time: 11/05/15 11:18 PM  Result Value Ref Range Status   MRSA, PCR NEGATIVE NEGATIVE Final   Staphylococcus aureus POSITIVE (A) NEGATIVE Final    Comment:        The Xpert SA Assay (FDA approved for NASAL specimens in patients over 51 years of age), is one component of a comprehensive surveillance program.  Test performance has been validated by [facility] for patients greater than or equal to 77 year old. It is not intended to diagnose infection nor to guide or monitor treatment.   Fungus Culture With Stain (Not @ [facility])     Status: None (Preliminary result)   Collection Time: 11/06/15  2:33 PM  Result Value Ref Range Status   Fungus Stain Final report  Final    Comment: (NOTE) Performed At: [facility] [place], [place] [id number] Lindon Romp MD [id number]    Fungus (Mycology) Culture PENDING  Incomplete   Fungal Source WOUND  Final    Comment: LEFT TOE POF VANC AND  ZOSYN   Acid Fast Smear (AFB)     Status: None   Collection Time: 11/06/15  2:33 PM  Result Value Ref Range Status   AFB Specimen  Processing Concentration  Final   Acid Fast Smear Negative  Final    Comment: (NOTE) Performed At: [facility] [place], [place] [id number] Lindon Romp MD [id number]    Source (AFB) WOUND  Final    Comment: LEFT TOE POF VANC AND ZOSYN   Aerobic Culture (superficial specimen)     Status: None   Collection Time: 11/06/15  2:33 PM  Result Value Ref Range Status   Specimen Description WOUND LEFT TOE  Final   Special Requests GREAT TOE POF VANC AND ZOSYN  Final   Gram Stain   Final    NO WBC SEEN FEW SQUAMOUS EPITHELIAL CELLS PRESENT FEW GRAM POSITIVE COCCI IN CLUSTERS    Culture MULTIPLE ORGANISMS PRESENT, NONE PREDOMINANT  Final   Report Status 11/08/2015 FINAL  Final  Fungus Culture Result     Status: None   Collection Time: 11/06/15  2:33 PM  Result Value Ref Range Status   Result 1 Comment  Final    Comment: (NOTE) KOH/Calcofluor preparation:  no fungus observed. Performed At: [facility] [place], [place] [id number] Lindon Romp MD [id number]     Radiology Reports Dg Chest Port 1 View  Result Date: 11/06/2015 CLINICAL DATA:  75 year old male with increasing shortness of breath and abnormal left side pulmonary auscultation. Initial encounter. EXAM: PORTABLE CHEST 1 VIEW COMPARISON:  0508 hours today and earlier. FINDINGS: Portable AP semi upright view at 2127 hours. Continued and mildly increased perihilar and basilar predominant interstitial pulmonary opacity. Stable cardiac size and mediastinal contours. No pneumothorax. No pleural effusion or consolidation identified. IMPRESSION: Increased bilateral pulmonary interstitial opacity. Top differential considerations include progression of interstitial edema versus viral/ atypical respiratory infection. No pleural effusion identified.  Electronically Signed   By: Genevie Ann M.D.   On: 11/06/2015 21:42   Dg Chest Port 1 View  Result Date: 11/06/2015 CLINICAL DATA:  75 year old male with aspiration of liquid. EXAM: PORTABLE CHEST 1 VIEW COMPARISON:  Chest radiograph dated 11/05/2015 FINDINGS: Mild cardiomegaly with central vascular congestion. Bibasilar Interstitial prominence and nodular densities, new from prior study may represent atelectatic changes versus infiltrate. There is no pleural effusion or pneumothorax. Top-normal cardiac silhouette. No acute osseous pathology. IMPRESSION: Mild congestive changes with bibasilar subsegmental atelectasis versus infiltrate. Electronically Signed   By: Anner Crete M.D.   On: 11/06/2015 05:16   Dg Chest Port 1 View  Result Date: 11/05/2015 CLINICAL DATA:  Shortness of breath EXAM: PORTABLE CHEST 1 VIEW COMPARISON:  March 14, 2015 FINDINGS: The lungs are clear. Heart is mildly enlarged with pulmonary vascularity within normal limits. No adenopathy. No bone lesions. IMPRESSION: Mild cardiac enlargement.  No edema or consolidation. Electronically Signed   By: Lowella Grip III M.D.   On: 11/05/2015 09:42   Dg Foot Complete Left  Result Date: 10/28/2015 CLINICAL DATA:  Left big toe pain. EXAM: LEFT FOOT - COMPLETE 3+ VIEW COMPARISON:  None. FINDINGS: There is no evidence of fracture or dislocation. No evidence of dystrophic bony changes. There is soft tissue swelling of the distal first digit. Punctate lucencies within the area of soft tissue swelling may represent entrapped fat or potentially foci of gas. Heavy vascular calcifications are seen. IMPRESSION: No evidence of fracture of the left foot. Soft tissue swelling of the distal first digit with punctate lucencies within the area of swelling, which may represent entrapped fat or potentially foci of gas. Please correlate clinically regarding the possibility of cellulitis. Electronically Signed  By: Fidela Salisbury M.D.   On: 10/28/2015  21:01   Time Spent in minutes  32   Eliseo Withers M.D on 11/08/2015 at 12:13 PM  Between 7am to 7pm - Pager - [phone]  After 7pm go to [email or web address] - password [facility]  [facility] -  Office  [phone]

## 2015-11-08 NOTE — Progress Notes (Signed)
SLP Cancellation Note  Patient Details Name: TULIO SYRACUSE MRN: [id number] DOB: 07-Jan-1941   Cancelled treatment:       Reason Eval/Treat Not Completed: Other (comment) (pt complained of dyspnea and needing bed pain, advised not to eat if short of breath; will follow up)  Luanna Salk, [facility] SLP [phone]

## 2015-11-09 ENCOUNTER — Inpatient Hospital Stay (HOSPITAL_COMMUNITY): Payer: Medicare Other

## 2015-11-09 DIAGNOSIS — I5033 Acute on chronic diastolic (congestive) heart failure: Secondary | ICD-10-CM

## 2015-11-09 DIAGNOSIS — I509 Heart failure, unspecified: Secondary | ICD-10-CM

## 2015-11-09 LAB — COMPREHENSIVE METABOLIC PANEL
ALBUMIN: 2 g/dL — AB (ref 3.5–5.0)
ALK PHOS: 98 U/L (ref 38–126)
ALT: 15 U/L — AB (ref 17–63)
ANION GAP: 8 (ref 5–15)
AST: 18 U/L (ref 15–41)
BILIRUBIN TOTAL: 1.1 mg/dL (ref 0.3–1.2)
BUN: 45 mg/dL — AB (ref 6–20)
CALCIUM: 8.6 mg/dL — AB (ref 8.9–10.3)
CO2: 20 mmol/L — AB (ref 22–32)
CREATININE: 2.78 mg/dL — AB (ref 0.61–1.24)
Chloride: 109 mmol/L (ref 101–111)
GFR calc Af Amer: 24 mL/min — ABNORMAL LOW (ref >60)
GFR calc non Af Amer: 21 mL/min — ABNORMAL LOW (ref >60)
GLUCOSE: 147 mg/dL — AB (ref 65–99)
Potassium: 4.5 mmol/L (ref 3.5–5.1)
SODIUM: 137 mmol/L (ref 135–145)
Total Protein: 5.6 g/dL — ABNORMAL LOW (ref 6.5–8.1)

## 2015-11-09 LAB — CBC
HCT: 35.9 % — ABNORMAL LOW (ref 39.0–52.0)
HEMOGLOBIN: 11.6 g/dL — AB (ref 13.0–17.0)
MCH: 30.5 pg (ref 26.0–34.0)
MCHC: 32.3 g/dL (ref 30.0–36.0)
MCV: 94.5 fL (ref 78.0–100.0)
Platelets: 282 10*3/uL (ref 150–400)
RBC: 3.8 MIL/uL — ABNORMAL LOW (ref 4.22–5.81)
RDW: 15.7 % — AB (ref 11.5–15.5)
WBC: 8.8 10*3/uL (ref 4.0–10.5)

## 2015-11-09 LAB — HEPARIN LEVEL (UNFRACTIONATED): HEPARIN UNFRACTIONATED: 0.4 [IU]/mL (ref 0.30–0.70)

## 2015-11-09 LAB — PROTIME-INR
INR: 1.9
Prothrombin Time: 22 seconds — ABNORMAL HIGH (ref 11.4–15.2)

## 2015-11-09 LAB — GLUCOSE, CAPILLARY
GLUCOSE-CAPILLARY: 139 mg/dL — AB (ref 65–99)
GLUCOSE-CAPILLARY: 164 mg/dL — AB (ref 65–99)
GLUCOSE-CAPILLARY: 230 mg/dL — AB (ref 65–99)
GLUCOSE-CAPILLARY: 251 mg/dL — AB (ref 65–99)

## 2015-11-09 LAB — C DIFFICILE QUICK SCREEN W PCR REFLEX
C DIFFICILE (CDIFF) INTERP: NOT DETECTED
C DIFFICILE (CDIFF) TOXIN: NEGATIVE
C Diff antigen: NEGATIVE

## 2015-11-09 LAB — VANCOMYCIN, RANDOM: Vancomycin Rm: 19

## 2015-11-09 MED ORDER — FUROSEMIDE 10 MG/ML IJ SOLN
160.0000 mg | Freq: Three times a day (TID) | INTRAVENOUS | Status: DC
  Administered 2015-11-09 – 2015-11-12 (×9): 160 mg via INTRAVENOUS
  Filled 2015-11-09 (×11): qty 16

## 2015-11-09 MED ORDER — METOLAZONE 2.5 MG PO TABS
2.5000 mg | ORAL_TABLET | Freq: Every day | ORAL | Status: DC
  Administered 2015-11-09: 2.5 mg via ORAL

## 2015-11-09 MED ORDER — WARFARIN SODIUM 5 MG PO TABS
5.0000 mg | ORAL_TABLET | Freq: Once | ORAL | Status: AC
  Administered 2015-11-09: 5 mg via ORAL
  Filled 2015-11-09: qty 1

## 2015-11-09 MED ORDER — SACCHAROMYCES BOULARDII 250 MG PO CAPS
250.0000 mg | ORAL_CAPSULE | Freq: Two times a day (BID) | ORAL | Status: DC
  Administered 2015-11-09 – 2015-11-26 (×33): 250 mg via ORAL
  Filled 2015-11-09 (×34): qty 1

## 2015-11-09 MED ORDER — METOLAZONE 2.5 MG PO TABS
2.5000 mg | ORAL_TABLET | Freq: Once | ORAL | Status: DC
  Filled 2015-11-09: qty 1

## 2015-11-09 NOTE — Progress Notes (Signed)
[facility] Rounding Note  Subjective: Patient seen and examined this morning. He is more dyspneic this morning.   Feels uncomfortable with all the fluid on him. Denies any chest pain.   Foot bandage was changed this AM.  Objective: BP (!) 154/83 (BP Location: Left Arm)   Pulse 65   Temp 97.9 F (36.6 C) (Oral)   Resp (!) 24   Ht 5\' 9"  (1.753 m)   Wt 242 lb 8.1 oz (110 kg)   SpO2 95%   BMI 35.81 kg/m    Intake/Output Summary (Last 24 hours) at 11/09/15 0939 Last data filed at 11/09/15 [id number]  Gross per 24 hour  Intake           907.75 ml  Output              600 ml  Net           307.75 ml    Weight change:   EXAM: General: lying in bed, on supplemental oxygen, uncomfortable appearing EOMI, no scleral icterus Cardiac: irregular rhythm, regular rate Pulm: diminished breath sounds, bibasilar crackles, having increased work Abd: soft, non-tender, protuberant Ext: warm and well perfused, 2+ lower extremity edema bilaterally plus some upper extremity edema.  Left foot wrapped in clean, dry Ace bandage. GU: condom cath Neuro: alert and oriented X3, cranial nerves II-XII grossly intact   Labs: Basic Metabolic Panel:  Recent Labs Lab 11/05/15 0930  11/07/15 0317 11/08/15 1055 11/09/15 0256  NA  --   < > 139 139 137  K  --   < > 4.3 4.2 4.5  CL  --   < > 111 109 109  CO2  --   < > 20* 21* 20*  GLUCOSE  --   < > 141* 130* 147*  BUN  --   < > 30* 37* 45*  CREATININE  --   < > 2.44* 2.52* 2.78*  CALCIUM  --   < > 8.5* 8.7* 8.6*  MG 2.4  --   --   --   --   < > = values in this interval not displayed.    Recent Labs Lab 11/07/15 0317 11/08/15 1055 11/09/15 0256  AST 25 21 18   ALT 17 16* 15*  ALKPHOS 87 93 98  BILITOT 0.9 0.9 1.1  PROT 5.8* 6.0* 5.6*  ALBUMIN 2.0* 2.1* 2.0*     Recent Labs Lab 11/05/15 0532 11/06/15 0527 11/07/15 0055 11/08/15 1055 11/09/15 0256  WBC 12.3* 9.4 9.8 8.9 8.8  HGB 14.2 13.0 12.4* 12.9* 11.6*  HCT 43.4 40.0 38.4* 39.4 35.9*   MCV 95.2 96.4 94.6 94.5 94.5  PLT 329 285 281 298 282     Recent Labs Lab 11/05/15 0856 11/06/15 1540 11/06/15 1940  TROPONINI 0.09* 0.06* 0.07*      Recent Labs Lab 11/08/15 0647 11/08/15 1129 11/08/15 1630 11/08/15 2216 11/09/15 0620  GLUCAP 105* 147* 158* 172* 139*    ABG    Component Value Date/Time   PHART 7.408 11/07/2015 0235   PCO2ART 30.9 (L) 11/07/2015 0235   PO2ART 71.1 (L) 11/07/2015 0235   HCO3 19.1 (L) 11/07/2015 0235   TCO2 20.1 11/07/2015 0235   ACIDBASEDEF 4.7 (H) 11/07/2015 0235   O2SAT 94.4 11/07/2015 0235    Studies/Results: Dg Chest Port 1 View  Result Date: 11/09/2015 CLINICAL DATA:  Shortness of breath EXAM: PORTABLE CHEST 1 VIEW COMPARISON:  November 06, 2015 FINDINGS: Mild cardiomegaly. The hila and mediastinum are unchanged. Diffuse bilateral interstitial  opacities again identified and similar in the interval. The opacity is a little more focal in the left suprahilar region which is increased since November 05, 2015 but unchanged since November 06, 2015. No other acute interval changes. There may be tiny bilateral effusions with blunting of the costophrenic angles. IMPRESSION: 1. Suspected tiny bilateral pleural effusions and diffuse increased interstitial markings are most consistent with pulmonary edema. Atypical viral infection could have a similar appearance. Recommend clinical correlation. *Opacity in the left suprahilar region is a little more focal when compared November 05, 2015. The difference could be due to confluence of shadows. Recommend follow-up to resolution. Electronically Signed   By: Dorise Bullion III M.D   On: 11/09/2015 09:14   Scheduled Meds: . allopurinol  150 mg Oral Daily  . amLODipine  10 mg Oral Daily  . aspirin  81 mg Oral Daily  . calcitRIOL  0.5 mcg Oral Daily  . docusate sodium  100 mg Oral Daily  . famotidine  20 mg Oral QHS  . feeding supplement (GLUCERNA SHAKE)  237 mL Oral Q1500  . feeding supplement (PRO-STAT SUGAR  FREE 64)  30 mL Oral BID BM  . furosemide  160 mg Intravenous BID  . hydrALAZINE  25 mg Oral Q8H  . insulin aspart  0-15 Units Subcutaneous TID WC  . insulin aspart  5 Units Subcutaneous TID WC  . insulin glargine  20 Units Subcutaneous QHS  . levothyroxine  137 mcg Oral QAC breakfast  . magnesium oxide  400 mg Oral BID  . metolazone  2.5 mg Oral Once  . metoprolol succinate  100 mg Oral Daily  . mupirocin ointment  1 application Nasal BID  . phosphorus  250 mg Oral BID  . piperacillin-tazobactam (ZOSYN)  IV  3.375 g Intravenous Q8H  . potassium chloride  20 mEq Oral BID  . predniSONE  5 mg Oral Daily  . rosuvastatin  10 mg Oral QPM  . tacrolimus  1 mg Oral BID  . Warfarin - Pharmacist Dosing Inpatient   Does not apply q1800   PRN Meds alum & mag hydroxide-simeth, guaiFENesin-dextromethorphan, hydrALAZINE, HYDROcodone-acetaminophen, labetalol, levalbuterol, metoprolol, ondansetron (ZOFRAN) IV, phenol  Infusion Meds . dextrose 25 mL/hr at 11/06/15 0030  . heparin 1,500 Units/hr (11/08/15 0358)   Background: 75 y.o. male.  He has PMHx of CKD V due to diabetes mellitus type II, s/p renal transplant in 2005 at [facility].  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  Creatine baseline appears to be 1.8-2.0.  Primary nephrologist is Dr. Justin Mend. Saw him on 06/26/2015 with BUN 27, creatinine 1.88.  Admitted on 10/28/2015 with necrotic left great toe with cellulitis. Creatinine on admission was 2.6.  Seen by [name] in consult on 7/25.  Creatinine had been improving until last couple days when started to trend back up post aortogram and in setting of being up 13 liters since admission.  Assessment/Plan:  Renal transplant (2005)  with AKI on CKD4 post contrast administration - Baseline creatinine 1.88-2 Timing of increased creatinine coincides with aortogram on August 1, however, atheroembolic event remains possible as well given his stuttering creatinine increases. Renal  function worse today from 2.52 to 2.78.   Dyspnea is worse and still markedly volume overloaded.   UOP recorded over past 24 hours as 400cc, however, he and his nurse report likely inaccurate as his condom catheter has come off at times and output has been lost.  CXR this morning shows suspected tiny bilateral pleural effusions  and diffuse increased interstitial markings most consistent with pulmonary edema.   Lasix was increased yesterday to 160mg  IV BID.   Metolazone 2.5 added - not yet given No electrolyte abnormalities at this point.   - recheck Prograf level today per Dr. Salvadore Dom recs from last week (appears not drawn yet). - Increase furosemide to 160 mg Q8H and make metolazone daily  CKD-MBD  On calcitriol 0.55mcg daily, phosphorous 250mg  BID PTH on 7/25 was 82  HTN/Volume:   BP has been elevated in setting of volume overload.   Hydralazine TID added in addition to amlodipine and Lasix.   Cardiology following.   Continuing current medications  DM Per primary service  AFib:  BB for rate control, on heparin, coumadin per [facility]  Gangrene/Cellulitis/Toe Amputation:   S/p atherectomy and DCB L popliteal art, and PTA left anterior tibial artery 11/05/2015.   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   On zosyn [facility] following.  HLD  Hypothyroidism   Jule Ser  9:39 AM   I have seen and examined this patient and agree with plan and assessment in the above note with renal recommendations/intervention highlighted.  I favor increasing furosemide to Q8H and adding daily metolazone for right now.  Edwyn Inclan B,MD 11/09/2015 10:45 AM

## 2015-11-09 NOTE — Progress Notes (Signed)
SLP Cancellation Note  Patient Details Name: Mark [name] MRN: [id number] DOB: 10-22-1940   Cancelled treatment:       Reason Eval/Treat Not Completed: Patient declined, no reason specified; spoke with nursing; pt feels he "can't breathe"; oxygen saturations good per nursing, but fluid overload may be reason for SOB; taking pills without difficulty per nursing; minimal appetite   Malaiyah Achorn,PAT, M.S., CCC-SLP 11/09/2015, 3:15 PM

## 2015-11-09 NOTE — Clinical Social Work Note (Signed)
CSW spoke with Derenda Mis, admissions coordinator at [facility]. If patient discharging over the weekend, she needs discharge summary by 10:30 am. Also, she needed to know if patient was discharging with Zosyn and would he start an antirejection medication.  MD paged with these questions.  Dayton Scrape, [place]

## 2015-11-09 NOTE — Care Management Note (Signed)
Case Management Note  Patient Details  Name: Mark [name] MRN: [id number] Date of Birth: October 25, 1940  Subjective/Objective:   Amputation of left first toe with resection of metatarsal head 8/2                 Action/Plan: Discharge Planning:  Chart reviewed. Waiting final recommendations SNF vs HH. Pt preoperatively arranged with [facility] for HH.    Expected Discharge Date:                  Expected Discharge Plan:  Skilled Nursing Facility  In-House Referral:  Clinical Social Work  Discharge planning Services  CM Consult  Post Acute Care Choice:  Home Health Choice offered to:     DME Arranged:    DME Agency:     HH Arranged:    [place] Agency:  Other - See comment, [place]  Status of Service:  In process, will continue to follow  If discussed at Long Length of Stay Meetings, dates discussed:    Additional Comments:  Erenest Rasher, RN 11/09/2015, 9:22 AM

## 2015-11-09 NOTE — Progress Notes (Signed)
    Subjective:  Denies CP; complains of worsening dyspnea   Objective:  Vitals:   11/08/15 0445 11/08/15 1400 11/08/15 2025 11/09/15 0340  BP: (!) 157/68 (!) 158/82 (!) 155/67 (!) 154/83  Pulse:  79 62 65  Resp:  18 20 (!) 24  Temp:  97.7 F (36.5 C) 97.8 F (36.6 C) 97.9 F (36.6 C)  TempSrc:  Axillary Oral Oral  SpO2:  93% 95% 95%  Weight:      Height:        Intake/Output from previous day:  Intake/Output Summary (Last 24 hours) at 11/09/15 0818 Last data filed at 11/09/15 0543  Gross per 24 hour  Intake           907.75 ml  Output              400 ml  Net           507.75 ml    Physical Exam: Physical exam: Well-developed obese in no acute distress.  Skin is warm and dry.  HEENT is normal.  Neck is supple.  Chest with diminished BS bases Cardiovascular exam is irregular Abdominal exam nontender or distended. No masses palpated.  Extremities show 1+ edema neuro grossly intact    Lab Results: Basic Metabolic Panel:  Recent Labs  11/08/15 1055 11/09/15 0256  NA 139 137  K 4.2 4.5  CL 109 109  CO2 21* 20*  GLUCOSE 130* 147*  BUN 37* 45*  CREATININE 2.52* 2.78*  CALCIUM 8.7* 8.6*   CBC:  Recent Labs  11/08/15 1055 11/09/15 0256  WBC 8.9 8.8  HGB 12.9* 11.6*  HCT 39.4 35.9*  MCV 94.5 94.5  PLT 298 282   Cardiac Enzymes:  Recent Labs  11/06/15 1540 11/06/15 1940  TROPONINI 0.06* 0.07*     Assessment/Plan:  1 atrial fibrillation-patient remains in atrial fibrillation. Continue beta blocker for rate control. Digoxin DCed. Continue heparin; resume coumadin when okay with vascular surgery. 2 chronic stage III renal insufficiency/status post renal transplant-patient with worsening dyspnea this AM; give AM lasix now with metolazone; follow renal function; nephrology following; repeat chest xray. 3 toe gangrene-for amputation today. 4 Hypertension-BP elevated; continue present meds and follow.   Kirk Ruths 11/09/2015, 8:18 AM

## 2015-11-09 NOTE — Progress Notes (Addendum)
  Progress Note 11/09/2015 8:41 AM 3 Days Post-Op  Subjective:  Says he short of breath this morning.  Foot is fine.  afebrile  Vitals:   11/08/15 2025 11/09/15 0340  BP: (!) 155/67 (!) 154/83  Pulse: 62 65  Resp: 20 (!) 24  Temp: 97.8 F (36.6 C) 97.9 F (36.6 C)    Physical Exam: Extremities:  Bandage is in tact and dry.  (dressing changed earlier this morning)   CBC    Component Value Date/Time   WBC 8.8 11/09/2015 0256   RBC 3.80 (L) 11/09/2015 0256   HGB 11.6 (L) 11/09/2015 0256   HCT 35.9 (L) 11/09/2015 0256   PLT 282 11/09/2015 0256   MCV 94.5 11/09/2015 0256   MCH 30.5 11/09/2015 0256   MCHC 32.3 11/09/2015 0256   RDW 15.7 (H) 11/09/2015 0256   LYMPHSABS 1.9 10/28/2015 1919   MONOABS 0.9 10/28/2015 1919   EOSABS 0.0 10/28/2015 1919   BASOSABS 0.0 10/28/2015 1919    BMET    Component Value Date/Time   NA 137 11/09/2015 0256   K 4.5 11/09/2015 0256   CL 109 11/09/2015 0256   CO2 20 (L) 11/09/2015 0256   GLUCOSE 147 (H) 11/09/2015 0256   BUN 45 (H) 11/09/2015 0256   CREATININE 2.78 (H) 11/09/2015 0256   CALCIUM 8.6 (L) 11/09/2015 0256   GFRNONAA 21 (L) 11/09/2015 0256   GFRAA 24 (L) 11/09/2015 0256    INR    Component Value Date/Time   INR 1.90 11/09/2015 0256     Intake/Output Summary (Last 24 hours) at 11/09/15 0841 Last data filed at 11/09/15 0823  Gross per 24 hour  Intake           907.75 ml  Output              600 ml  Net           307.75 ml     Assessment:  75 y.o. male is s/p:  Left great toe amputation  3 Days Post-Op  Plan: -pt with worsening dyspnea this morning-cardiology/nephrology following.  Renal functioning worsening. -dressing changed this morning at 3am-wound checked yesterday - will check wound again tomorrow morning. -DVT prophylaxis:  Heparin/coumadin bridge.  1st dose of coumadin given last night.  INR 1.9 today -afebrile/normal WBC   Leontine Locket, PA-C [facility] [phone] 11/09/2015 8:41 AM  I have interviewed the patient and examined the patient. I agree with the findings by the PA. S/P atherectomy and DCB Left popliteal artery and PTA left ATA (POD 4) S/P Ray amputation of left great toe (POD 3) Palpable Left popliteal pulse Dressing dry.   Gae Gallop, MD [phone]

## 2015-11-09 NOTE — Progress Notes (Signed)
ANTICOAGULATION CONSULT NOTE - Follow Up Consult  Pharmacy Consult for Heparin/Coumadin + Zosyn Indication: afib + L foot gangrene  Allergies  Allergen Reactions  . Tape Other (See Comments)    SKIN WILL TEAR!!    Patient Measurements: Height: 5\' 9"  (175.3 cm) Weight: 242 lb 8.1 oz (110 kg) IBW/kg (Calculated) : 70.7  Vital Signs: Temp: 97.9 F (36.6 C) (08/05 0340) Temp Source: Oral (08/05 0340) BP: 154/83 (08/05 0340) Pulse Rate: 65 (08/05 0340)  Labs:  Recent Labs  11/06/15 1540 11/06/15 1940  11/07/15 0055 11/07/15 0317 11/07/15 0921 11/08/15 1055 11/09/15 0256  HGB  --   --   < > 12.4*  --   --  12.9* 11.6*  HCT  --   --   --  38.4*  --   --  39.4 35.9*  PLT  --   --   --  281  --   --  298 282  LABPROT  --   --   --   --  20.5*  --  20.1* 22.0*  INR  --   --   --   --  1.73  --  1.69 1.90  HEPARINUNFRC  --   --   < > 0.34  --  0.39 0.39 0.40  CREATININE  --   --   --   --  2.44*  --  2.52* 2.78*  TROPONINI 0.06* 0.07*  --   --   --   --   --   --   < > = values in this interval not displayed.  Estimated Creatinine Clearance: 28.1 mL/min (by C-G formula based on SCr of 2.78 mg/dL).   Assessment: 75 yo m presents after fall getting out of shower and primary MD stent pt to ED with diabetic foot infection. Toe found to be grossly infected and necrotic    AC: Warfarin PTA for afib. S/P arteriogram 8/1 with successful arthrectomy and balloon angioplasty. Heparin restarted following left great toe amputation 8/2. Hgb 11.6 continues to slowly decline, plt wnl. HL 0.40 in goal.  Left great toe site with bloody drainage. Warfarin restarted 8/4 with heparin bridge. INR subtherapeutic at 1.90 after warfarin 7.5mg  x1 8/4.   **Warfarin PTA dosing: 5mg  daily except 2.5mg  on Tuesdays and Thursdays.   ID: Vanc + Zosyn for gangrene/cellulitis of toe (L foot).  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 Vascular surgery consulted- continuing ABX. S/p left great  toe amputation 8/2. Currently afebrile, WBC WNL. Vanco random 8/5 of 19, and no dose since 8/2 PM. SCr 2.78 - continues to trend up. Per nephro, okay to d/c vancomycin order completely 8.5.  Zosyn 7/24 >> Vancomycin 7/24 >> 8/3  7/24 BCx: ngtd (final) 7/31 Cdiff: neg 8/1: MRSA screen: pos 8/2: Fungus culture: IP  7/27 VT = 15  8/4 VR = 23 8/5 VR = 19  Goal of Therapy:  INR 2-3 Monitor platelets by anticoagulation protocol: Yes  Vancomycin trough 15-20.   Plan:  -Heparin 1500 units/hr. -Warfarin 5 mg PO x1 -Discontinue vancomycin. -Continue Zosyn 3.375 gm IV q8h   Arrie Senate, PharmD PGY-1 Pharmacy Resident Pager: [phone] 11/09/2015

## 2015-11-09 NOTE — Progress Notes (Signed)
Patient ID: SELDEN [name], male   DOB: 12/03/1940, 75 y.o.   MRN: [id number]                                                                PROGRESS NOTE                                                                                                                                                                                                             Patient Demographics:    Mark [name], is a 75 y.o. male, DOB - [birth year], [id number]  Admit date - 10/28/2015   Admitting Physician Etta Quill, DO  Outpatient Primary MD for the patient is Rochel Brome, MD  LOS - 12  Outpatient Specialists:   Chief Complaint  Patient presents with  . 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 Patient presents to ED with c/o 'his toe is dead'. Creatinine baseline appears to be 1.8-2.3.  Primary nephrologist is Dr. Justin Mend.  Saw him on 06/26/2015 with BUN 27, creatinine 1.88.  Admitted on 10/28/2015 with necrotic left great toe with cellulitis.  Creatinine on admission was 2.6.  Seen by [name] in consult on 7/25.  His Prograf level was found to be low and was increased.  Patient underwent abdominal aortogram with angioplasty of the left anterior tibial artery and left popliteal artery on 11/05/2015.  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  He was on Vancomycin and Zosyn from 7/24-8/3.  Currently only on Zosyn.  He developed some dyspnea in the setting of being positive about 13 liters since admission with evidence of volume overload on exam   Subjective:    [name]  S/p amputation, very  SOB , no cp    Assessment  & Plan :    Principal Problem:   Gangrene of toe ([place]) Active Problems:   Type II diabetes mellitus, uncontrolled ([place])   Renal transplant recipient   Diabetic ulcer of left great toe ([name])   Cellulitis of great toe of  left foot   Aspiration of liquid   SOB (shortness of breath)   Permanent atrial fibrillation ([facility])   Bibasilar  crackles   Shortness of breath, Suspect acute on chronic diastolic heart failure Likely secondary to pulmonary vascular congestion , cardiology and nephrology managing diuretics Chest x-ray shows mild congestive changes,  Dose of Lasix increased to 160 mg IV BID , creatinine now 2.78  2-D echo did not show any wall motion abnormalities and the patient does not appear to have any ischemic changes on his EKG    Gangrene of left  Toe/Toe Amputation: dc  zosyn.  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  Had abdominal aortogram with angioplasty of the left anterior tibial artery and left popliteal artery on 11/05/2015.  VVS following .ABI shows no blood flow to the left toe,  Situation complicated by renal transplant with elevated creatinine, Coumadin resumed, patient currently on a heparin drip as INR 1.9 Received clearance from cardiology according to [facility] and AHA guidelines   no further cardiac workup was done prior to his noncardiac surgery  Wound culture shows gram-positive cocci in clusters, however blood culture has been negative so far, if margins are clear doubt  patient needs to continue with antibiotics at this time especially in the setting of diarrhea  Acute on Chronic kidney disease stage IV, status post renal transplant , baseline creatinine around 1.7, now 2.78, Patient is status post kidney transplant in [facility] at [facility]  . Nephrology consulted to monitor patient's renal function in the setting of   procedure/angiogram.Continue anti-rejection meds, appreciate [name] following along during this hospitalization, Prograf level was low and [name] increased prograf,   Timing of increased creatinine coincides with aortogram on August 1. Repeat Prograf level currently creatinine is trending up   A fib with with aberrancy - resume coumadin, heparin per pharmacy, rate controlled  , preop 2-D echo shows grade 2 diastolic dysfunction, pulmonary hypertension, EKG/telemetry shows A. fib rate controlled, cont digoxin, metoprolol, Cardiology consulted for preop clearance ,  He is having some aberrancy on telemetry. Continue beta blocker for rate control. Digoxin DCed. Continue heparin; resume coumadin per  vascular surgery  DM2 - Lantus reduced   to 20 units, contpre-meal NovoLog 5 units.Moderate dose mealtime and SSI AC/HS, DC D10   Hemoglobin A1c 8.2  Hypertension: uncontrolled Continue norvasc to 10mg  po qday , metoprolol Prn hydralazine  Hypothyroidism-continue Synthroid  Protein Calorie Malnutrition prostat  Dyslipidemia-continue Crestor  Pulmonary hypertension:  Mr. [name] has moderate pulmonary hypertension.   cardiology Recommend outpatient sleep study and PFTs.  This may also be due to left-sided heart disease. He does have grade 2 diastolic dysfunction. Recommend diuresis with stable per nephrology.  There is no evidence of liver disease by laboratory testing   Code Status : FULL CODE  Family Communication  :   Disposition Plan  :  ? Rehab vs home  Barriers For Discharge : renal function  Consults  :  Vascular surgery, nephrology, cardiology  Procedures  : Atherectomy and drug coated balloon angioplasty 8/1 , toe amputation 8/2  DVT Prophylaxis  :  Heparin     Lab Results  Component Value Date   PLT 282 11/09/2015    Antibiotics  :    Anti-infectives    Start     Dose/Rate Route Frequency Ordered Stop   11/06/15 1115  ceFAZolin (ANCEF) IVPB 1 g/50 mL premix  Status:  Discontinued    Comments:  Send with pt to OR   1 g 100 mL/hr over 30 Minutes Intravenous On call 11/05/15 2346 11/06/15 1632   10/29/15 2200  vancomycin (VANCOCIN) IVPB 1000  mg/200 mL premix  Status:  Discontinued     1,000 mg 200 mL/hr over 60 Minutes Intravenous Every 24 hours 10/28/15 2154 11/07/15 1058   10/28/15 2200  piperacillin-tazobactam (ZOSYN) IVPB  3.375 g     3.375 g 12.5 mL/hr over 240 Minutes Intravenous Every 8 hours 10/28/15 2120     10/28/15 2200  vancomycin (VANCOCIN) 2,500 mg in sodium chloride 0.9 % 500 mL IVPB     2,500 mg 250 mL/hr over 120 Minutes Intravenous  Once 10/28/15 2154 10/29/15 2330        Objective:   Vitals:   11/08/15 0445 11/08/15 1400 11/08/15 2025 11/09/15 0340  BP: (!) 157/68 (!) 158/82 (!) 155/67 (!) 154/83  Pulse:  79 62 65  Resp:  18 20 (!) 24  Temp:  97.7 F (36.5 C) 97.8 F (36.6 C) 97.9 F (36.6 C)  TempSrc:  Axillary Oral Oral  SpO2:  93% 95% 95%  Weight:      Height:        Wt Readings from Last 3 Encounters:  11/06/15 110 kg (242 lb 8.1 oz)  09/05/15 103.1 kg (227 lb 3.2 oz)  08/05/15 101.8 kg (224 lb 6.4 oz)     Intake/Output Summary (Last 24 hours) at 11/09/15 0818 Last data filed at 11/09/15 0543  Gross per 24 hour  Intake           907.75 ml  Output              400 ml  Net           507.75 ml     Physical Exam  Awake Alert, Oriented X 3, No new F.N deficits, Normal affect [place].AT,PERRAL Supple Neck,No JVD, No cervical lymphadenopathy appriciated.  Symmetrical Chest wall movement, Good air movement bilaterally, CTAB RRR,No Gallops,Rubs or new Murmurs, No Parasternal Heave +ve B.Sounds, Abd Soft, No tenderness, No organomegaly appriciated, No rebound - guarding or rigidity. No edema.  1st left foot black.    Data Review:    CBC  Recent Labs Lab 11/05/15 0532 11/06/15 0527 11/07/15 0055 11/08/15 1055 11/09/15 0256  WBC 12.3* 9.4 9.8 8.9 8.8  HGB 14.2 13.0 12.4* 12.9* 11.6*  HCT 43.4 40.0 38.4* 39.4 35.9*  PLT 329 285 281 298 282  MCV 95.2 96.4 94.6 94.5 94.5  MCH 31.1 31.3 30.5 30.9 30.5  MCHC 32.7 32.5 32.3 32.7 32.3  RDW 15.2 15.6* 15.5 15.4 15.7*    Chemistries   Recent Labs Lab 11/05/15 0532 11/05/15 0930 11/06/15 0527 11/07/15 0317 11/08/15 1055 11/09/15 0256  NA 140  --  141 139 139 137  K 4.8  --  4.3 4.3 4.2 4.5  CL 113*  --  113*  111 109 109  CO2 20*  --  22 20* 21* 20*  GLUCOSE 86  --  83 141* 130* 147*  BUN 27*  --  27* 30* 37* 45*  CREATININE 1.88*  --  2.33* 2.44* 2.52* 2.78*  CALCIUM 9.0  --  8.5* 8.5* 8.7* 8.6*  MG  --  2.4  --   --   --   --   AST 40  --  26 25 21 18   ALT 21  --  18 17 16* 15*  ALKPHOS 102  --  85 87 93 98  BILITOT 1.3*  --  0.8 0.9 0.9 1.1   ------------------------------------------------------------------------------------------------------------------ No results for input(s): CHOL, HDL, LDLCALC, TRIG, CHOLHDL, LDLDIRECT in the last 72 hours.  Lab  Results  Component Value Date   HGBA1C 8.2 (H) 10/29/2015   ------------------------------------------------------------------------------------------------------------------ No results for input(s): TSH, T4TOTAL, T3FREE, THYROIDAB in the last 72 hours.  Invalid input(s): FREET3 ------------------------------------------------------------------------------------------------------------------ No results for input(s): VITAMINB12, FOLATE, FERRITIN, TIBC, IRON, RETICCTPCT in the last 72 hours.  Coagulation profile  Recent Labs Lab 11/05/15 0532 11/06/15 0527 11/07/15 0317 11/08/15 1055 11/09/15 0256  INR 1.54 1.51 1.73 1.69 1.90    No results for input(s): DDIMER in the last 72 hours.  Cardiac Enzymes  Recent Labs Lab 11/05/15 0856 11/06/15 1540 11/06/15 1940  TROPONINI 0.09* 0.06* 0.07*   ------------------------------------------------------------------------------------------------------------------ No results found for: BNP  Inpatient Medications  Scheduled Meds: . allopurinol  150 mg Oral Daily  . amLODipine  10 mg Oral Daily  . aspirin  81 mg Oral Daily  . calcitRIOL  0.5 mcg Oral Daily  . docusate sodium  100 mg Oral Daily  . famotidine  20 mg Oral QHS  . feeding supplement (GLUCERNA SHAKE)  237 mL Oral Q1500  . feeding supplement (PRO-STAT SUGAR FREE 64)  30 mL Oral BID BM  . furosemide  160 mg Intravenous  BID  . hydrALAZINE  25 mg Oral Q8H  . insulin aspart  0-15 Units Subcutaneous TID WC  . insulin aspart  5 Units Subcutaneous TID WC  . insulin glargine  20 Units Subcutaneous QHS  . levothyroxine  137 mcg Oral QAC breakfast  . magnesium oxide  400 mg Oral BID  . metoprolol succinate  100 mg Oral Daily  . mupirocin ointment  1 application Nasal BID  . phosphorus  250 mg Oral BID  . piperacillin-tazobactam (ZOSYN)  IV  3.375 g Intravenous Q8H  . potassium chloride  20 mEq Oral BID  . predniSONE  5 mg Oral Daily  . rosuvastatin  10 mg Oral QPM  . tacrolimus  1 mg Oral BID  . Warfarin - Pharmacist Dosing Inpatient   Does not apply q1800   Continuous Infusions: . dextrose 25 mL/hr at 11/06/15 0030  . heparin 1,500 Units/hr (11/08/15 0358)   PRN Meds:.alum & mag hydroxide-simeth, guaiFENesin-dextromethorphan, hydrALAZINE, HYDROcodone-acetaminophen, labetalol, levalbuterol, metoprolol, ondansetron (ZOFRAN) IV, phenol  Micro Results Recent Results (from the past 240 hour(s))  C difficile quick scan w PCR reflex     Status: None   Collection Time: 11/04/15  9:42 AM  Result Value Ref Range Status   C Diff antigen NEGATIVE NEGATIVE Final   C Diff toxin NEGATIVE NEGATIVE Final   C Diff interpretation No C. difficile detected.  Final  Surgical PCR screen     Status: Abnormal   Collection Time: 11/05/15 11:18 PM  Result Value Ref Range Status   MRSA, PCR NEGATIVE NEGATIVE Final   Staphylococcus aureus POSITIVE (A) NEGATIVE Final    Comment:        The Xpert SA Assay (FDA approved for NASAL specimens in patients over 78 years of age), is one component of a comprehensive surveillance program.  Test performance has been validated by [facility] for patients greater than or equal to 68 year old. It is not intended to diagnose infection nor to guide or monitor treatment.   Fungus Culture With Stain (Not @ [facility])     Status: None (Preliminary result)   Collection Time: 11/06/15  2:33 PM    Result Value Ref Range Status   Fungus Stain Final report  Final    Comment: (NOTE) Performed At: [facility] [address] [place], [place] [id number] Darrel Hoover  F MD [id number]    Fungus (Mycology) Culture PENDING  Incomplete   Fungal Source WOUND  Final    Comment: LEFT TOE POF VANC AND ZOSYN   Acid Fast Smear (AFB)     Status: None   Collection Time: 11/06/15  2:33 PM  Result Value Ref Range Status   AFB Specimen Processing Concentration  Final   Acid Fast Smear Negative  Final    Comment: (NOTE) Performed At: [facility] [place], [place] [id number] Lindon Romp MD [id number]    Source (AFB) WOUND  Final    Comment: LEFT TOE POF VANC AND ZOSYN   Aerobic Culture (superficial specimen)     Status: None   Collection Time: 11/06/15  2:33 PM  Result Value Ref Range Status   Specimen Description WOUND LEFT TOE  Final   Special Requests GREAT TOE POF VANC AND ZOSYN  Final   Gram Stain   Final    NO WBC SEEN FEW SQUAMOUS EPITHELIAL CELLS PRESENT FEW GRAM POSITIVE COCCI IN CLUSTERS    Culture MULTIPLE ORGANISMS PRESENT, NONE PREDOMINANT  Final   Report Status 11/08/2015 FINAL  Final  Fungus Culture Result     Status: None   Collection Time: 11/06/15  2:33 PM  Result Value Ref Range Status   Result 1 Comment  Final    Comment: (NOTE) KOH/Calcofluor preparation:  no fungus observed. Performed At: [facility] [place], [place] [id number] Lindon Romp MD [id number]     Radiology Reports Dg Chest Port 1 View  Result Date: 11/06/2015 CLINICAL DATA:  75 year old male with increasing shortness of breath and abnormal left side pulmonary auscultation. Initial encounter. EXAM: PORTABLE CHEST 1 VIEW COMPARISON:  0508 hours today and earlier. FINDINGS: Portable AP semi upright view at 2127 hours. Continued and mildly increased perihilar and basilar predominant interstitial pulmonary opacity. Stable  cardiac size and mediastinal contours. No pneumothorax. No pleural effusion or consolidation identified. IMPRESSION: Increased bilateral pulmonary interstitial opacity. Top differential considerations include progression of interstitial edema versus viral/ atypical respiratory infection. No pleural effusion identified. Electronically Signed   By: Genevie Ann M.D.   On: 11/06/2015 21:42   Dg Chest Port 1 View  Result Date: 11/06/2015 CLINICAL DATA:  75 year old male with aspiration of liquid. EXAM: PORTABLE CHEST 1 VIEW COMPARISON:  Chest radiograph dated 11/05/2015 FINDINGS: Mild cardiomegaly with central vascular congestion. Bibasilar Interstitial prominence and nodular densities, new from prior study may represent atelectatic changes versus infiltrate. There is no pleural effusion or pneumothorax. Top-normal cardiac silhouette. No acute osseous pathology. IMPRESSION: Mild congestive changes with bibasilar subsegmental atelectasis versus infiltrate. Electronically Signed   By: Anner Crete M.D.   On: 11/06/2015 05:16   Dg Chest Port 1 View  Result Date: 11/05/2015 CLINICAL DATA:  Shortness of breath EXAM: PORTABLE CHEST 1 VIEW COMPARISON:  March 14, 2015 FINDINGS: The lungs are clear. Heart is mildly enlarged with pulmonary vascularity within normal limits. No adenopathy. No bone lesions. IMPRESSION: Mild cardiac enlargement.  No edema or consolidation. Electronically Signed   By: Lowella Grip III M.D.   On: 11/05/2015 09:42   Dg Foot Complete Left  Result Date: 10/28/2015 CLINICAL DATA:  Left big toe pain. EXAM: LEFT FOOT - COMPLETE 3+ VIEW COMPARISON:  None. FINDINGS: There is no evidence of fracture or dislocation. No evidence of dystrophic bony changes. There is soft tissue swelling of the distal first digit. Punctate lucencies within the area of soft tissue swelling may  represent entrapped fat or potentially foci of gas. Heavy vascular calcifications are seen. IMPRESSION: No evidence of  fracture of the left foot. Soft tissue swelling of the distal first digit with punctate lucencies within the area of swelling, which may represent entrapped fat or potentially foci of gas. Please correlate clinically regarding the possibility of cellulitis. Electronically Signed   By: Fidela Salisbury M.D.   On: 10/28/2015 21:01   Time Spent in minutes  40   Quyen Cutsforth M.D on 11/09/2015 at 8:18 AM  Between 7am to 7pm - Pager - [phone]  After 7pm go to [email or web address] - password [email or web address]  [facility] -  Office  [phone]

## 2015-11-10 LAB — COMPREHENSIVE METABOLIC PANEL
ALBUMIN: 2 g/dL — AB (ref 3.5–5.0)
ALK PHOS: 93 U/L (ref 38–126)
ALT: 15 U/L — AB (ref 17–63)
AST: 16 U/L (ref 15–41)
Anion gap: 7 (ref 5–15)
BILIRUBIN TOTAL: 0.6 mg/dL (ref 0.3–1.2)
BUN: 68 mg/dL — AB (ref 6–20)
CALCIUM: 8.6 mg/dL — AB (ref 8.9–10.3)
CO2: 21 mmol/L — ABNORMAL LOW (ref 22–32)
Chloride: 110 mmol/L (ref 101–111)
Creatinine, Ser: 2.98 mg/dL — ABNORMAL HIGH (ref 0.61–1.24)
GFR calc Af Amer: 22 mL/min — ABNORMAL LOW (ref >60)
GFR calc non Af Amer: 19 mL/min — ABNORMAL LOW (ref >60)
GLUCOSE: 225 mg/dL — AB (ref 65–99)
Potassium: 4.8 mmol/L (ref 3.5–5.1)
Sodium: 138 mmol/L (ref 135–145)
TOTAL PROTEIN: 5.5 g/dL — AB (ref 6.5–8.1)

## 2015-11-10 LAB — PROTIME-INR
INR: 3.1
Prothrombin Time: 32.6 seconds — ABNORMAL HIGH (ref 11.4–15.2)

## 2015-11-10 LAB — CBC
HEMATOCRIT: 33.3 % — AB (ref 39.0–52.0)
Hemoglobin: 10.8 g/dL — ABNORMAL LOW (ref 13.0–17.0)
MCH: 30.5 pg (ref 26.0–34.0)
MCHC: 32.4 g/dL (ref 30.0–36.0)
MCV: 94.1 fL (ref 78.0–100.0)
Platelets: 294 10*3/uL (ref 150–400)
RBC: 3.54 MIL/uL — AB (ref 4.22–5.81)
RDW: 15.7 % — ABNORMAL HIGH (ref 11.5–15.5)
WBC: 9.2 10*3/uL (ref 4.0–10.5)

## 2015-11-10 LAB — GLUCOSE, CAPILLARY
GLUCOSE-CAPILLARY: 208 mg/dL — AB (ref 65–99)
GLUCOSE-CAPILLARY: 194 mg/dL — AB (ref 65–99)
Glucose-Capillary: 222 mg/dL — ABNORMAL HIGH (ref 65–99)
Glucose-Capillary: 184 mg/dL — ABNORMAL HIGH (ref 65–99)

## 2015-11-10 LAB — HEPARIN LEVEL (UNFRACTIONATED): Heparin Unfractionated: 0.57 IU/mL (ref 0.30–0.70)

## 2015-11-10 MED ORDER — METOLAZONE 5 MG PO TABS
5.0000 mg | ORAL_TABLET | Freq: Every day | ORAL | Status: DC
  Administered 2015-11-10 – 2015-11-12 (×3): 5 mg via ORAL
  Filled 2015-11-10 (×4): qty 1

## 2015-11-10 MED ORDER — POTASSIUM CHLORIDE 20 MEQ PO PACK
20.0000 meq | PACK | Freq: Two times a day (BID) | ORAL | Status: DC
  Administered 2015-11-10 – 2015-11-11 (×4): 20 meq via ORAL
  Filled 2015-11-10 (×5): qty 1

## 2015-11-10 MED ORDER — ENSURE ENLIVE PO LIQD: 237.0000 mL | Freq: Two times a day (BID) | ORAL | Status: DC

## 2015-11-10 NOTE — Progress Notes (Addendum)
Pt on heparin at 15 mL/hr. Patient also taking coumadin. INR 3.1 this am. Called pharmacy and they will review.   Per pharmacy order heparin stopped. Pt shows no s/s of bleeding.  Fritz Pickerel, RN

## 2015-11-10 NOTE — Discharge Instructions (Addendum)
Information on my medicine - Coumadin®   (Warfarin) ° °This medication education was reviewed with me or my healthcare representative as part of my discharge preparation.  The pharmacist that spoke with me during my hospital stay was:  Michael T Bitonti, RPH ° °Why was Coumadin prescribed for you? °Coumadin was prescribed for you because you have a blood clot or a medical condition that can cause an increased risk of forming blood clots. Blood clots can cause serious health problems by blocking the flow of blood to the heart, lung, or brain. Coumadin can prevent harmful blood clots from forming. °As a reminder your indication for Coumadin is:   Stroke Prevention Because Of Atrial Fibrillation ° °What test will check on my response to Coumadin? °While on Coumadin (warfarin) you will need to have an INR test regularly to ensure that your dose is keeping you in the desired range. The INR (international normalized ratio) number is calculated from the result of the laboratory test called prothrombin time (PT). ° °If an INR APPOINTMENT HAS NOT ALREADY BEEN MADE FOR YOU please schedule an appointment to have this lab work done by your health care provider within 7 days. °Your INR goal is usually a number between:  2 to 3 or your provider may give you a more narrow range like 2-2.5.  Ask your health care provider during an office visit what your goal INR is. ° °What  do you need to  know  About  COUMADIN? °Take Coumadin (warfarin) exactly as prescribed by your healthcare provider about the same time each day.  DO NOT stop taking without talking to the doctor who prescribed the medication.  Stopping without other blood clot prevention medication to take the place of Coumadin may increase your risk of developing a new clot or stroke.  Get refills before you run out. ° °What do you do if you miss a dose? °If you miss a dose, take it as soon as you remember on the same day then continue your regularly scheduled regimen the next  day.  Do not take two doses of Coumadin at the same time. ° °Important Safety Information °A possible side effect of Coumadin (Warfarin) is an increased risk of bleeding. You should call your healthcare provider right away if you experience any of the following: °? Bleeding from an injury or your nose that does not stop. °? Unusual colored urine (red or dark brown) or unusual colored stools (red or black). °? Unusual bruising for unknown reasons. °? A serious fall or if you hit your head (even if there is no bleeding). ° °Some foods or medicines interact with Coumadin® (warfarin) and might alter your response to warfarin. To help avoid this: °? Eat a balanced diet, maintaining a consistent amount of Vitamin K. °? Notify your provider about major diet changes you plan to make. °? Avoid alcohol or limit your intake to 1 drink for women and 2 drinks for men per day. °(1 drink is 5 oz. wine, 12 oz. beer, or 1.5 oz. liquor.) ° °Make sure that ANY health care provider who prescribes medication for you knows that you are taking Coumadin (warfarin).  Also make sure the healthcare provider who is monitoring your Coumadin knows when you have started a new medication including herbals and non-prescription products. ° °Coumadin® (Warfarin)  Major Drug Interactions  °Increased Warfarin Effect Decreased Warfarin Effect  °Alcohol (large quantities) °Antibiotics (esp. Septra/Bactrim, Flagyl, Cipro) °Amiodarone (Cordarone) °Aspirin (ASA) °Cimetidine (Tagamet) °Megestrol (Megace) °NSAIDs (ibuprofen,   naproxen, etc.) Piroxicam (Feldene) Propafenone (Rythmol SR) Propranolol (Inderal) Isoniazid (INH) Posaconazole (Noxafil) Barbiturates (Phenobarbital) Carbamazepine (Tegretol) Chlordiazepoxide (Librium) Cholestyramine (Questran) Griseofulvin Oral Contraceptives Rifampin Sucralfate (Carafate) Vitamin K   Coumadin (Warfarin) Major Herbal Interactions  Increased Warfarin Effect Decreased Warfarin Effect   Garlic Ginseng Ginkgo biloba Coenzyme Q10 Green tea St. Johns wort    Coumadin (Warfarin) FOOD Interactions  Eat a consistent number of servings per week of foods HIGH in Vitamin K (1 serving =  cup)  Collards (cooked, or boiled & drained) Kale (cooked, or boiled & drained) Mustard greens (cooked, or boiled & drained) Parsley *serving size only =  cup Spinach (cooked, or boiled & drained) Swiss chard (cooked, or boiled & drained) Turnip greens (cooked, or boiled & drained)  Eat a consistent number of servings per week of foods MEDIUM-HIGH in Vitamin K (1 serving = 1 cup)  Asparagus (cooked, or boiled & drained) Broccoli (cooked, boiled & drained, or raw & chopped) Brussel sprouts (cooked, or boiled & drained) *serving size only =  cup Lettuce, raw (green leaf, endive, romaine) Spinach, raw Turnip greens, raw & chopped   These websites have more information on Coumadin (warfarin):  FailFactory.se; VeganReport.com.au;      11/19/2015 ARBIN WOOTTON [id number] [birth year]  Surgeon(s): Waynetta Sandy, MD  Procedure(s): LEFT ARM ARTERIOVENOUS (AV) FISTULA CREATION INSERTION OF DIALYSIS CATHETER RIGHT INTERNAL JUGULAR  x Do not stick fistula for 12 weeks

## 2015-11-10 NOTE — Progress Notes (Signed)
ANTICOAGULATION CONSULT NOTE - Follow Up Consult  Pharmacy Consult for Heparin/Coumadin Indication: afib   Allergies  Allergen Reactions  . Tape Other (See Comments)    SKIN WILL TEAR!!    Patient Measurements: Height: 5\' 9"  (175.3 cm) Weight: 242 lb 8.1 oz (110 kg) IBW/kg (Calculated) : 70.7  Vital Signs: Temp: 97.7 F (36.5 C) (08/06 0509) Temp Source: Oral (08/06 0509) BP: 134/83 (08/06 0509) Pulse Rate: 74 (08/06 0509)  Labs:  Recent Labs  11/08/15 1055 11/09/15 0256 11/10/15 0313  HGB 12.9* 11.6* 10.8*  HCT 39.4 35.9* 33.3*  PLT 298 282 294  LABPROT 20.1* 22.0* 32.6*  INR 1.69 1.90 3.10  HEPARINUNFRC 0.39 0.40 0.57  CREATININE 2.52* 2.78* 2.98*    Estimated Creatinine Clearance: 26.2 mL/min (by C-G formula based on SCr of 2.98 mg/dL).   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 Heparin started 8/3, PTA warfarin restarted 8/4. INR currently supratherapeutic with large increase 1.9>3.1. HL within goal at 0.57. Hgb declining, but MD noted bandages to be dry on 8/5. All abx have now been discontinued.  **Warfarin PTA dosing: 5mg  daily except 2.5mg  on Tuesdays and Thursdays.    Goal of Therapy:  INR 2-3 Monitor platelets by anticoagulation protocol: Yes    Plan:  -Discontinue heparin gtt -Hold warfarin tonight -Daily INR, S/Sx bleeding, CBC   Arrie Senate, PharmD PGY-1 Pharmacy Resident Pager: [phone] 11/10/2015

## 2015-11-10 NOTE — Progress Notes (Signed)
   VASCULAR SURGERY ASSESSMENT & PLAN:  S/P atherectomy and DCB Left popliteal artery and PTA left ATA (POD 5) S/P Ray amputation of left great toe (POD 4) Palpable Left popliteal pulse  Toe amp site looks ok. Continue dressing changes.   Disposition: for SNF/ rehab.   SUBJECTIVE: No complaints.   PHYSICAL EXAM: Vitals:   11/09/15 1552 11/09/15 2106 11/09/15 2307 11/10/15 0509  BP: (!) 147/66 (!) 125/54  134/83  Pulse: 86 81  74  Resp: 18 18  20   Temp: 97.5 F (36.4 C) 98.9 F (37.2 C)  97.7 F (36.5 C)  TempSrc: Oral Oral  Oral  SpO2: 93% 98% 96% 95%  Weight:      Height:       Left great toe amp site inspected. Looks OK.  No significant drainage No significant erythema. 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

## 2015-11-10 NOTE — Progress Notes (Signed)
Patient ID: Mark [name], male   DOB: 1940-06-14, 75 y.o.   MRN: [id number]                                                                PROGRESS NOTE                                                                                                                                                                                                             Patient Demographics:    Mark [name], is a 75 y.o. male, DOB - 09-08-1940, [id number]  Admit date - 10/28/2015   Admitting Physician Mark Quill, DO  Outpatient Primary MD for the patient is Mark Brome, MD  LOS - 13  Outpatient Specialists:   Chief Complaint  Patient presents with  . 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 Patient presents to ED with c/o 'his toe is dead'. Creatinine baseline appears to be 1.8-2.3.  Primary nephrologist is Dr. Justin [name].  Saw him on 06/26/2015 with BUN 27, creatinine 1.88.  Admitted on 10/28/2015 with necrotic left great toe with cellulitis.  Creatinine on admission was 2.6.  Seen by Mark [name] in consult on 7/25.  His Prograf level was found to be low and was increased.  Patient underwent abdominal aortogram with angioplasty of the left anterior tibial artery and left popliteal artery on 11/05/2015.  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  He was on Vancomycin and Zosyn from 7/24-8/3.  Currently only on Zosyn.  He developed some dyspnea in the setting of being positive about 13 liters since admission with evidence of volume overload on exam   Subjective:    Mark [name]  S/p amputation, very  SOB , no cp    Assessment  & Plan :    Principal Problem:   Gangrene of toe ([place]) Active Problems:   Type II diabetes mellitus, uncontrolled ([place])   Renal transplant recipient   Diabetic ulcer of left great toe ([place])   Cellulitis of great toe of  left foot   Aspiration of liquid   SOB (shortness of breath)   Permanent atrial fibrillation ([facility])   Bibasilar  crackles   CHF (congestive heart failure) ([facility])   Shortness of breath, Suspect acute on chronic diastolic heart failure Likely secondary to pulmonary vascular congestion , cardiology and nephrology managing diuretics Chest x-ray shows mild congestive changes,  he has had progressively worsening dyspnea   Nephrology Increased furosemide to 160 mg Q8H and make metolazone daily , creatinine now 2.98  2-D echo did not show any wall motion abnormalities and the patient does not appear to have any ischemic changes on his EKG    Gangrene of left  Toe/Toe Amputation: dc vancomycin/ zosyn.  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  Had abdominal aortogram with angioplasty of the left anterior tibial artery and left popliteal artery on 11/05/2015.  VVS following .ABI shows no blood flow to the left toe,  Situation complicated by renal transplant with elevated creatinine, Coumadin resumed, patient currently on a heparin drip as INR 1.9 Received clearance from cardiology according to [facility] and AHA guidelines   no further cardiac workup was done prior to his noncardiac surgery  Wound culture shows gram-positive cocci in clusters, however blood culture has been negative so far, if margins are clear doubt  patient needs to continue with antibiotics at this time especially in the setting of diarrhea Vanco random  level 8/5 of 19, and no dose of vancomycin since 8/2 PM.  Per nephro,   d/c vancomycin order completely 8.5    Diarrhea-C. difficile negative 2 during this admission, continue probiotic   Acute on Chronic kidney disease stage IV, status post renal transplant , baseline creatinine around 1.7, now 2.98, Patient is status post kidney transplant in [facility] at [facility] Nephrology consulted to monitor patient's renal function in the setting of   procedure/angiogram.Continue anti-rejection meds, Prograf level was  low and Mark [name] increased prograf,   Timing of increased creatinine coincides with aortogram on August 1. Repeat Prograf level pending, currently creatinine is trending up, nephrology following   A fib with with aberrancy - resumed coumadin, heparin per pharmacy, rate controlled , preop 2-D echo shows grade 2 diastolic dysfunction, pulmonary hypertension, EKG/telemetry shows A. fib rate controlled, continue metoprolol, Cardiology consulted for preop clearance ,  He is having some aberrancy on telemetry. Continue beta blocker for rate control. Digoxin DCed. Continue heparin; resume coumadin per  vascular surgery   DM2 - continue Lantus at 20 units since creatinine is trending up, will continue with the same dose of Lantus, contpre-meal NovoLog 5 units.Moderate dose mealtime and SSI AC/HS,     Hemoglobin A1c 8.2  Hypertension: uncontrolled Continue norvasc to 10mg  po qday , metoprolol Prn hydralazine  Hypothyroidism-continue Synthroid  Protein Calorie Malnutrition prostat  Dyslipidemia-continue Crestor  Pulmonary hypertension:  Mark [name] has moderate pulmonary hypertension.   cardiology Recommend outpatient sleep study and PFTs.  This may also be due to left-sided heart disease. He does have grade 2 diastolic dysfunction. Recommend diuresis as per nephrology.  There is no evidence of liver disease by laboratory testing   Code Status : FULL CODE  Family Communication  :   Disposition Plan  :  ? Rehab vs home  Barriers For Discharge : renal function  Consults  :  Vascular surgery, nephrology, cardiology  Procedures  : Atherectomy and drug coated balloon angioplasty 8/1 , toe amputation 8/2  DVT Prophylaxis  :  Heparin     Lab Results  Component Value Date   PLT 294 11/10/2015    Antibiotics  :     10/28/15 2200  piperacillin-tazobactam (ZOSYN) IVPB  3.375 g  Status:  Discontinued     3.375 g 12.5 mL/hr over 240 Minutes Intravenous Every 8 hours  10/28/15 2120 11/09/15 1057   10/28/15 2200  vancomycin (VANCOCIN) 2,500 mg in sodium chloride 0.9 % 500 mL IVPB     2,500 mg 250 mL/hr over 120 Minutes Intravenous  Once 10/28/15 2154 10/29/15 2330        Objective:   Vitals:   11/09/15 1552 11/09/15 2106 11/09/15 2307 11/10/15 0509  BP: (!) 147/66 (!) 125/54  134/83  Pulse: 86 81  74  Resp: 18 18  20   Temp: 97.5 F (36.4 C) 98.9 F (37.2 C)  97.7 F (36.5 C)  TempSrc: Oral Oral  Oral  SpO2: 93% 98% 96% 95%  Weight:      Height:        Wt Readings from Last 3 Encounters:  11/06/15 110 kg (242 lb 8.1 oz)  09/05/15 103.1 kg (227 lb 3.2 oz)  08/05/15 101.8 kg (224 lb 6.4 oz)     Intake/Output Summary (Last 24 hours) at 11/10/15 0834 Last data filed at 11/10/15 0827  Gross per 24 hour  Intake                0 ml  Output             1000 ml  Net            -1000 ml     Physical Exam somnolent, No new F.N deficits, Normal affect [place].AT,PERRAL Supple Neck,No JVD, No cervical lymphadenopathy appriciated.  Symmetrical Chest wall movement, Good air movement bilaterally, CTAB RRR,No Gallops,Rubs or new Murmurs, No Parasternal Heave +ve B.Sounds, Abd Soft, No tenderness, No organomegaly appriciated, No rebound - guarding or rigidity. No edema.  1st left foot black.    Data Review:    CBC  Recent Labs Lab 11/06/15 0527 11/07/15 0055 11/08/15 1055 11/09/15 0256 11/10/15 0313  WBC 9.4 9.8 8.9 8.8 9.2  HGB 13.0 12.4* 12.9* 11.6* 10.8*  HCT 40.0 38.4* 39.4 35.9* 33.3*  PLT 285 281 298 282 294  MCV 96.4 94.6 94.5 94.5 94.1  MCH 31.3 30.5 30.9 30.5 30.5  MCHC 32.5 32.3 32.7 32.3 32.4  RDW 15.6* 15.5 15.4 15.7* 15.7*    Chemistries   Recent Labs Lab 11/05/15 0930 11/06/15 0527 11/07/15 0317 11/08/15 1055 11/09/15 0256 11/10/15 0313  NA  --  141 139 139 137 138  K  --  4.3 4.3 4.2 4.5 4.8  CL  --  113* 111 109 109 110  CO2  --  22 20* 21* 20* 21*  GLUCOSE  --  83 141* 130* 147* 225*  BUN  --  27* 30*  37* 45* 68*  CREATININE  --  2.33* 2.44* 2.52* 2.78* 2.98*  CALCIUM  --  8.5* 8.5* 8.7* 8.6* 8.6*  MG 2.4  --   --   --   --   --   AST  --  26 25 21 18 16   ALT  --  18 17 16* 15* 15*  ALKPHOS  --  85 87 93 98 93  BILITOT  --  0.8 0.9 0.9 1.1 0.6   ------------------------------------------------------------------------------------------------------------------ No results for input(s): CHOL, HDL, LDLCALC, TRIG, CHOLHDL, LDLDIRECT in the last 72 hours.  Lab Results  Component Value Date   HGBA1C 8.2 (H) 10/29/2015   ------------------------------------------------------------------------------------------------------------------ No results for input(s): TSH, T4TOTAL, T3FREE, THYROIDAB in the last 72 hours.  Invalid input(s): FREET3 ------------------------------------------------------------------------------------------------------------------ No  results for input(s): VITAMINB12, FOLATE, FERRITIN, TIBC, IRON, RETICCTPCT in the last 72 hours.  Coagulation profile  Recent Labs Lab 11/06/15 0527 11/07/15 0317 11/08/15 1055 11/09/15 0256 11/10/15 0313  INR 1.51 1.73 1.69 1.90 3.10    No results for input(s): DDIMER in the last 72 hours.  Cardiac Enzymes  Recent Labs Lab 11/05/15 0856 11/06/15 1540 11/06/15 1940  TROPONINI 0.09* 0.06* 0.07*   ------------------------------------------------------------------------------------------------------------------ No results found for: BNP  Inpatient Medications  Scheduled Meds: . allopurinol  150 mg Oral Daily  . amLODipine  10 mg Oral Daily  . aspirin  81 mg Oral Daily  . calcitRIOL  0.5 mcg Oral Daily  . docusate sodium  100 mg Oral Daily  . famotidine  20 mg Oral QHS  . feeding supplement (GLUCERNA SHAKE)  237 mL Oral Q1500  . feeding supplement (PRO-STAT SUGAR FREE 64)  30 mL Oral BID BM  . furosemide  160 mg Intravenous Q8H  . hydrALAZINE  25 mg Oral Q8H  . insulin aspart  0-15 Units Subcutaneous TID WC  .  insulin aspart  5 Units Subcutaneous TID WC  . insulin glargine  20 Units Subcutaneous QHS  . levothyroxine  137 mcg Oral QAC breakfast  . magnesium oxide  400 mg Oral BID  . metolazone  2.5 mg Oral Daily  . metoprolol succinate  100 mg Oral Daily  . mupirocin ointment  1 application Nasal BID  . phosphorus  250 mg Oral BID  . potassium chloride  20 mEq Oral BID  . predniSONE  5 mg Oral Daily  . rosuvastatin  10 mg Oral QPM  . saccharomyces boulardii  250 mg Oral BID  . tacrolimus  1 mg Oral BID  . Warfarin - Pharmacist Dosing Inpatient   Does not apply q1800   Continuous Infusions: . heparin 1,500 Units/hr (11/09/15 1535)   PRN Meds:.alum & mag hydroxide-simeth, guaiFENesin-dextromethorphan, hydrALAZINE, HYDROcodone-acetaminophen, labetalol, levalbuterol, metoprolol, ondansetron (ZOFRAN) IV, phenol  Micro Results Recent Results (from the past 240 hour(s))  C difficile quick scan w PCR reflex     Status: None   Collection Time: 11/04/15  9:42 AM  Result Value Ref Range Status   C Diff antigen NEGATIVE NEGATIVE Final   C Diff toxin NEGATIVE NEGATIVE Final   C Diff interpretation No C. difficile detected.  Final  Surgical PCR screen     Status: Abnormal   Collection Time: 11/05/15 11:18 PM  Result Value Ref Range Status   MRSA, PCR NEGATIVE NEGATIVE Final   Staphylococcus aureus POSITIVE (A) NEGATIVE Final    Comment:        The Xpert SA Assay (FDA approved for NASAL specimens in patients over 46 years of age), is one component of a comprehensive surveillance program.  Test performance has been validated by [facility] for patients greater than or equal to 41 year old. It is not intended to diagnose infection nor to guide or monitor treatment.   Fungus Culture With Stain (Not @ [facility])     Status: None (Preliminary result)   Collection Time: 11/06/15  2:33 PM  Result Value Ref Range Status   Fungus Stain Final report  Final    Comment: (NOTE) Performed At: [facility] [place], [place] [id number] Lindon Romp MD [id number]    Fungus (Mycology) Culture PENDING  Incomplete   Fungal Source WOUND  Final    Comment: LEFT TOE POF VANC AND ZOSYN   Acid Fast Smear (AFB)  Status: None   Collection Time: 11/06/15  2:33 PM  Result Value Ref Range Status   AFB Specimen Processing Concentration  Final   Acid Fast Smear Negative  Final    Comment: (NOTE) Performed At: [facility] [place], [place] [id number] Lindon Romp MD [id number]    Source (AFB) WOUND  Final    Comment: LEFT TOE POF VANC AND ZOSYN   Aerobic Culture (superficial specimen)     Status: None   Collection Time: 11/06/15  2:33 PM  Result Value Ref Range Status   Specimen Description WOUND LEFT TOE  Final   Special Requests GREAT TOE POF VANC AND ZOSYN  Final   Gram Stain   Final    NO WBC SEEN FEW SQUAMOUS EPITHELIAL CELLS PRESENT FEW GRAM POSITIVE COCCI IN CLUSTERS    Culture MULTIPLE ORGANISMS PRESENT, NONE PREDOMINANT  Final   Report Status 11/08/2015 FINAL  Final  Fungus Culture Result     Status: None   Collection Time: 11/06/15  2:33 PM  Result Value Ref Range Status   Result 1 Comment  Final    Comment: (NOTE) KOH/Calcofluor preparation:  no fungus observed. Performed At: [facility] [place], [place] [id number] Lindon Romp MD [id number]   C difficile quick scan w PCR reflex     Status: None   Collection Time: 11/09/15  4:08 PM  Result Value Ref Range Status   C Diff antigen NEGATIVE NEGATIVE Final   C Diff toxin NEGATIVE NEGATIVE Final   C Diff interpretation No C. difficile detected.  Final    Radiology Reports Dg Chest Port 1 View  Result Date: 11/09/2015 CLINICAL DATA:  Shortness of breath EXAM: PORTABLE CHEST 1 VIEW COMPARISON:  November 06, 2015 FINDINGS: Mild cardiomegaly. The hila and mediastinum are unchanged. Diffuse bilateral interstitial opacities again  identified and similar in the interval. The opacity is a little more focal in the left suprahilar region which is increased since November 05, 2015 but unchanged since November 06, 2015. No other acute interval changes. There may be tiny bilateral effusions with blunting of the costophrenic angles. IMPRESSION: 1. Suspected tiny bilateral pleural effusions and diffuse increased interstitial markings are most consistent with pulmonary edema. Atypical viral infection could have a similar appearance. Recommend clinical correlation. *Opacity in the left suprahilar region is a little more focal when compared November 05, 2015. The difference could be due to confluence of shadows. Recommend follow-up to resolution. Electronically Signed   By: Dorise Bullion III M.D   On: 11/09/2015 09:14   Dg Chest Port 1 View  Result Date: 11/06/2015 CLINICAL DATA:  75 year old male with increasing shortness of breath and abnormal left side pulmonary auscultation. Initial encounter. EXAM: PORTABLE CHEST 1 VIEW COMPARISON:  0508 hours today and earlier. FINDINGS: Portable AP semi upright view at 2127 hours. Continued and mildly increased perihilar and basilar predominant interstitial pulmonary opacity. Stable cardiac size and mediastinal contours. No pneumothorax. No pleural effusion or consolidation identified. IMPRESSION: Increased bilateral pulmonary interstitial opacity. Top differential considerations include progression of interstitial edema versus viral/ atypical respiratory infection. No pleural effusion identified. Electronically Signed   By: Genevie Ann M.D.   On: 11/06/2015 21:42   Dg Chest Port 1 View  Result Date: 11/06/2015 CLINICAL DATA:  75 year old male with aspiration of liquid. EXAM: PORTABLE CHEST 1 VIEW COMPARISON:  Chest radiograph dated 11/05/2015 FINDINGS: Mild cardiomegaly with central vascular congestion. Bibasilar Interstitial prominence and nodular densities, new from prior  study may represent atelectatic changes  versus infiltrate. There is no pleural effusion or pneumothorax. Top-normal cardiac silhouette. No acute osseous pathology. IMPRESSION: Mild congestive changes with bibasilar subsegmental atelectasis versus infiltrate. Electronically Signed   By: Anner Crete M.D.   On: 11/06/2015 05:16   Dg Chest Port 1 View  Result Date: 11/05/2015 CLINICAL DATA:  Shortness of breath EXAM: PORTABLE CHEST 1 VIEW COMPARISON:  March 14, 2015 FINDINGS: The lungs are clear. Heart is mildly enlarged with pulmonary vascularity within normal limits. No adenopathy. No bone lesions. IMPRESSION: Mild cardiac enlargement.  No edema or consolidation. Electronically Signed   By: Lowella Grip III M.D.   On: 11/05/2015 09:42   Dg Foot Complete Left  Result Date: 10/28/2015 CLINICAL DATA:  Left big toe pain. EXAM: LEFT FOOT - COMPLETE 3+ VIEW COMPARISON:  None. FINDINGS: There is no evidence of fracture or dislocation. No evidence of dystrophic bony changes. There is soft tissue swelling of the distal first digit. Punctate lucencies within the area of soft tissue swelling may represent entrapped fat or potentially foci of gas. Heavy vascular calcifications are seen. IMPRESSION: No evidence of fracture of the left foot. Soft tissue swelling of the distal first digit with punctate lucencies within the area of swelling, which may represent entrapped fat or potentially foci of gas. Please correlate clinically regarding the possibility of cellulitis. Electronically Signed   By: Fidela Salisbury M.D.   On: 10/28/2015 21:01   Time Spent in minutes  21   Casara Perrier M.D on 11/10/2015 at 8:34 AM  Between 7am to 7pm - Pager - [phone]  After 7pm go to [email or web address] - password [facility]  [facility] -  Office  [phone]

## 2015-11-10 NOTE — Progress Notes (Signed)
[facility] Rounding Note  Subjective: Patient seen and examined this morning. His dyspnea has been doing better.  Currently experiencing more SOB due to be rolled around in the bed by nursing so he can be changed. RN reports that his condom catheter stayed on so output recordings are accurate. Still uncomfortable due to edema.  Objective: BP 134/83 (BP Location: Left Arm)   Pulse 74   Temp 97.7 F (36.5 C) (Oral)   Resp 20   Ht 5\' 9"  (1.753 m)   Wt 242 lb 8.1 oz (110 kg)   SpO2 95%   BMI 35.81 kg/m    Intake/Output Summary (Last 24 hours) at 11/10/15 0904 Last data filed at 11/10/15 0827  Gross per 24 hour  Intake                0 ml  Output             1000 ml  Net            -1000 ml    Weight change:   EXAM: General: lying in bed, on supplemental oxygen, no acute distress HEENT: [place]/AT, EOMI, no scleral icterus Cardiac: irregular rhythm, regular rate Pulm: diminished breath sounds, bibasilar crackles Abd: soft, non-tender, protuberant Ext: warm and well perfused, tender due to edema, 2+ lower extremity edema bilaterally plus puffy upper extremities.  Left foot wrapped in clean, dry Ace bandage. GU: condom cath Neuro: alert and oriented X3, cranial nerves II-XII grossly intact   Labs:  Recent Labs Lab 11/05/15 0930  11/08/15 1055 11/09/15 0256 11/10/15 0313  NA  --   < > 139 137 138  K  --   < > 4.2 4.5 4.8  CL  --   < > 109 109 110  CO2  --   < > 21* 20* 21*  GLUCOSE  --   < > 130* 147* 225*  BUN  --   < > 37* 45* 68*  CREATININE  --   < > 2.52* 2.78* 2.98*  CALCIUM  --   < > 8.7* 8.6* 8.6*  MG 2.4  --   --   --   --   < > = values in this interval not displayed.    Recent Labs Lab 11/08/15 1055 11/09/15 0256 11/10/15 0313  AST 21 18 16   ALT 16* 15* 15*  ALKPHOS 93 98 93  BILITOT 0.9 1.1 0.6  PROT 6.0* 5.6* 5.5*  ALBUMIN 2.1* 2.0* 2.0*     Recent Labs Lab 11/06/15 0527 11/07/15 0055 11/08/15 1055 11/09/15 0256 11/10/15 0313  WBC 9.4 9.8  8.9 8.8 9.2  HGB 13.0 12.4* 12.9* 11.6* 10.8*  HCT 40.0 38.4* 39.4 35.9* 33.3*  MCV 96.4 94.6 94.5 94.5 94.1  PLT 285 281 298 282 294     Recent Labs Lab 11/05/15 0856 11/06/15 1540 11/06/15 1940  TROPONINI 0.09* 0.06* 0.07*      Recent Labs Lab 11/09/15 0620 11/09/15 1130 11/09/15 1620 11/09/15 2253 11/10/15 0600  GLUCAP 139* 164* 230* 251* 222*    ABG    Component Value Date/Time   PHART 7.408 11/07/2015 0235   PCO2ART 30.9 (L) 11/07/2015 0235   PO2ART 71.1 (L) 11/07/2015 0235   HCO3 19.1 (L) 11/07/2015 0235   TCO2 20.1 11/07/2015 0235   ACIDBASEDEF 4.7 (H) 11/07/2015 0235   O2SAT 94.4 11/07/2015 0235   TAC trough level from 8/5 pending  Studies/Results: Dg Chest Port 1 View  Result Date: 11/09/2015 CLINICAL DATA:  Shortness of breath EXAM: PORTABLE CHEST 1 VIEW COMPARISON:  November 06, 2015 FINDINGS: Mild cardiomegaly. The hila and mediastinum are unchanged. Diffuse bilateral interstitial opacities again identified and similar in the interval. The opacity is a little more focal in the left suprahilar region which is increased since November 05, 2015 but unchanged since November 06, 2015. No other acute interval changes. There may be tiny bilateral effusions with blunting of the costophrenic angles. IMPRESSION: 1. Suspected tiny bilateral pleural effusions and diffuse increased interstitial markings are most consistent with pulmonary edema. Atypical viral infection could have a similar appearance. Recommend clinical correlation. *Opacity in the left suprahilar region is a little more focal when compared November 05, 2015. The difference could be due to confluence of shadows. Recommend follow-up to resolution. Electronically Signed   By: Dorise Bullion III M.D   On: 11/09/2015 09:14   Scheduled Meds: . allopurinol  150 mg Oral Daily  . amLODipine  10 mg Oral Daily  . aspirin  81 mg Oral Daily  . calcitRIOL  0.5 mcg Oral Daily  . docusate sodium  100 mg Oral Daily  .  famotidine  20 mg Oral QHS  . feeding supplement (GLUCERNA SHAKE)  237 mL Oral Q1500  . feeding supplement (PRO-STAT SUGAR FREE 64)  30 mL Oral BID BM  . furosemide  160 mg Intravenous Q8H  . hydrALAZINE  25 mg Oral Q8H  . insulin aspart  0-15 Units Subcutaneous TID WC  . insulin aspart  5 Units Subcutaneous TID WC  . insulin glargine  20 Units Subcutaneous QHS  . levothyroxine  137 mcg Oral QAC breakfast  . magnesium oxide  400 mg Oral BID  . metolazone  2.5 mg Oral Daily  . metoprolol succinate  100 mg Oral Daily  . mupirocin ointment  1 application Nasal BID  . phosphorus  250 mg Oral BID  . potassium chloride  20 mEq Oral BID  . predniSONE  5 mg Oral Daily  . rosuvastatin  10 mg Oral QPM  . saccharomyces boulardii  250 mg Oral BID  . tacrolimus  1 mg Oral BID  . Warfarin - Pharmacist Dosing Inpatient   Does not apply q1800   PRN Meds alum & mag hydroxide-simeth, guaiFENesin-dextromethorphan, hydrALAZINE, HYDROcodone-acetaminophen, labetalol, levalbuterol, metoprolol, ondansetron (ZOFRAN) IV, phenol  Infusion Meds . heparin 1,500 Units/hr (11/09/15 1535)   Background: 75 y.o. male.  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  Creatine baseline appears to be 1.8-2.0.  Primary nephrologist is Dr. Justin Mend. Saw him on 06/26/2015 with BUN 27, creatinine 1.88.  Admitted on 10/28/2015 with necrotic left great toe with cellulitis. Creatinine on admission was 2.6.  Seen by [name] in consult on 7/25.  Creatinine had been improving until last couple days when started to trend back up post aortogram and in setting of being up 13 liters since admission.  Assessment/Plan:  Renal transplant (2005)  with AKI on CKD4 post contrast administration - Baseline creatinine 1.88-2 Timing of increased creatinine coincides with aortogram on August 1, however, atheroembolic event remains  possible as well given his stuttering creatinine increases. Renal function worse today from 2.78 to 2.98.   Dyspnea is overall improved but still markedly volume overloaded.   UOP recorded yesterday as 800cc, 400cc this AM.   Lasix has been increased to 160mg  IV TID Metolazone 2.5 added. Electrolytes stable  -  repeat Tacrolimus level is processing. - continue furosemide 160 mg IV Q8H  - Increase metolazone to 5mg  daily  CKD-MBD  On calcitriol 0.85mcg daily, phosphorous 250mg  BID PTH on 7/25 was 82  HTN/Volume:   BP has improved over past 24 hours.   Still volume overloaded.   Hydralazine TID added in addition to amlodipine and Lasix.   Cardiology following.   Continuing current medications  DM Per primary service  AFib:  BB for rate control, on heparin, coumadin per VVS  Gangrene/Cellulitis/Toe Amputation:   S/p atherectomy and DCB L popliteal art, and PTA left anterior tibial artery 11/05/2015.   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   Off abx VVS following.  HLD  Hypothyroidism   Jule Ser  9:04 AM   I have seen and examined this patient and agree with plan and assessment in the above note with renal recommendations and intervention highlighted. Renal allograft function has deteriorated a bit more, and we are escalating diuretic dosing for his marked volume overload. Lasix is at 160 IB+V Q8H, and doubling metolazone. Has not had any striking diuresis so far...  Everleigh Colclasure B,MD 11/10/2015 11:11 AM

## 2015-11-10 NOTE — Progress Notes (Signed)
    Subjective:  Denies CP; dyspnea improved compared to yesterday   Objective:  Vitals:   11/09/15 1552 11/09/15 2106 11/09/15 2307 11/10/15 0509  BP: (!) 147/66 (!) 125/54  134/83  Pulse: 86 81  74  Resp: 18 18  20   Temp: 97.5 F (36.4 C) 98.9 F (37.2 C)  97.7 F (36.5 C)  TempSrc: Oral Oral  Oral  SpO2: 93% 98% 96% 95%  Weight:      Height:        Intake/Output from previous day:  Intake/Output Summary (Last 24 hours) at 11/10/15 0834 Last data filed at 11/10/15 0827  Gross per 24 hour  Intake                0 ml  Output             1000 ml  Net            -1000 ml    Physical Exam: Physical exam: Well-developed obese in no acute distress.  Skin is warm and dry.  HEENT is normal.  Neck is supple.  Chest with diminished BS bases Cardiovascular exam is irregular Abdominal exam nontender or distended. No masses palpated.  Extremities show 1-2 + edema neuro grossly intact    Lab Results: Basic Metabolic Panel:  Recent Labs  11/09/15 0256 11/10/15 0313  NA 137 138  K 4.5 4.8  CL 109 110  CO2 20* 21*  GLUCOSE 147* 225*  BUN 45* 68*  CREATININE 2.78* 2.98*  CALCIUM 8.6* 8.6*   CBC:  Recent Labs  11/09/15 0256 11/10/15 0313  WBC 8.8 9.2  HGB 11.6* 10.8*  HCT 35.9* 33.3*  MCV 94.5 94.1  PLT 282 294     Assessment/Plan:  1 atrial fibrillation-patient remains in atrial fibrillation. Continue beta blocker for rate control. Digoxin DCed. Continue heparin; coumadin resumed. 2 chronic stage III renal insufficiency/status post renal transplant-patient remains volume overloaded; lasix increased yesterday and metolazone added; follow renal function; nephrology following. 3 toe gangrene-for amputation today. 4 Hypertension-continue present meds and follow.   Kirk Ruths 11/10/2015, 8:34 AM

## 2015-11-11 DIAGNOSIS — T17998D Other foreign object in respiratory tract, part unspecified causing other injury, subsequent encounter: Secondary | ICD-10-CM

## 2015-11-11 LAB — COMPREHENSIVE METABOLIC PANEL
ALBUMIN: 2.1 g/dL — AB (ref 3.5–5.0)
ALT: 16 U/L — AB (ref 17–63)
AST: 17 U/L (ref 15–41)
Alkaline Phosphatase: 101 U/L (ref 38–126)
Anion gap: 8 (ref 5–15)
BUN: 72 mg/dL — ABNORMAL HIGH (ref 6–20)
CHLORIDE: 109 mmol/L (ref 101–111)
CO2: 21 mmol/L — AB (ref 22–32)
CREATININE: 3.06 mg/dL — AB (ref 0.61–1.24)
Calcium: 8.8 mg/dL — ABNORMAL LOW (ref 8.9–10.3)
GFR calc non Af Amer: 18 mL/min — ABNORMAL LOW (ref >60)
GFR, EST AFRICAN AMERICAN: 21 mL/min — AB (ref >60)
GLUCOSE: 163 mg/dL — AB (ref 65–99)
Potassium: 4.6 mmol/L (ref 3.5–5.1)
SODIUM: 138 mmol/L (ref 135–145)
Total Bilirubin: 0.8 mg/dL (ref 0.3–1.2)
Total Protein: 5.3 g/dL — ABNORMAL LOW (ref 6.5–8.1)

## 2015-11-11 LAB — CBC
HCT: 32.9 % — ABNORMAL LOW (ref 39.0–52.0)
HEMOGLOBIN: 10.6 g/dL — AB (ref 13.0–17.0)
MCH: 30.4 pg (ref 26.0–34.0)
MCHC: 32.2 g/dL (ref 30.0–36.0)
MCV: 94.3 fL (ref 78.0–100.0)
Platelets: 284 10*3/uL (ref 150–400)
RBC: 3.49 MIL/uL — AB (ref 4.22–5.81)
RDW: 16.3 % — ABNORMAL HIGH (ref 11.5–15.5)
WBC: 8.6 10*3/uL (ref 4.0–10.5)

## 2015-11-11 LAB — GLUCOSE, CAPILLARY
GLUCOSE-CAPILLARY: 159 mg/dL — AB (ref 65–99)
GLUCOSE-CAPILLARY: 134 mg/dL — AB (ref 65–99)
GLUCOSE-CAPILLARY: 172 mg/dL — AB (ref 65–99)
Glucose-Capillary: 191 mg/dL — ABNORMAL HIGH (ref 65–99)

## 2015-11-11 LAB — HEPARIN LEVEL (UNFRACTIONATED)

## 2015-11-11 LAB — PROTIME-INR
INR: 3.83
Prothrombin Time: 38.6 seconds — ABNORMAL HIGH (ref 11.4–15.2)

## 2015-11-11 LAB — TACROLIMUS LEVEL: Tacrolimus (FK506) - LabCorp: 7.3 ng/mL (ref 2.0–20.0)

## 2015-11-11 MED ORDER — PREDNISONE 5 MG PO TABS
25.0000 mg | ORAL_TABLET | Freq: Once | ORAL | Status: AC
  Administered 2015-11-11: 25 mg via ORAL
  Filled 2015-11-11: qty 1

## 2015-11-11 MED ORDER — BOOST / RESOURCE BREEZE PO LIQD
1.0000 | Freq: Three times a day (TID) | ORAL | Status: DC
  Administered 2015-11-12 – 2015-11-25 (×24): 1 via ORAL

## 2015-11-11 MED ORDER — BOOST / RESOURCE BREEZE PO LIQD: 1.0000 | Freq: Three times a day (TID) | ORAL | Status: DC

## 2015-11-11 MED ORDER — PREDNISONE 20 MG PO TABS
30.0000 mg | ORAL_TABLET | Freq: Every day | ORAL | Status: DC
  Administered 2015-11-12 – 2015-11-15 (×4): 30 mg via ORAL
  Filled 2015-11-11 (×4): qty 1

## 2015-11-11 NOTE — Progress Notes (Signed)
[facility] Rounding Note  Subjective: Patient seen and examined this morning. Breathing better this morning and resting comfortably. Still has a lot of fluid on him.  Legs are uncomfortable from this. Bandage changed this morning. States his condom catheter has been "on and off" so potentially UOP recordings are not completely accurate.  Objective: BP (!) 144/80 (BP Location: Left Arm)   Pulse 73   Temp 97.5 F (36.4 C) (Oral)   Resp 18   Ht 5\' 9"  (1.753 m)   Wt 242 lb 8.1 oz (110 kg)   SpO2 97%   BMI 35.81 kg/m    Intake/Output Summary (Last 24 hours) at 11/11/15 0811 Last data filed at 11/11/15 0450  Gross per 24 hour  Intake              120 ml  Output              800 ml  Net             -680 ml    Weight change:   EXAM: General: lying in bed, on supplemental oxygen, no acute distress HEENT: [place]/AT, EOMI, no scleral icterus Cardiac: irregular rhythm, regular rate Pulm: diminished breath sounds, bibasilar crackles Abd: soft, non-tender, protuberant Ext: warm and well perfused, tender due to edema, 2+ lower extremity edema bilaterally plus edematous upper extremities.  Left foot wrapped in clean, dry Ace bandage. GU: condom cath Neuro: alert and oriented X3, cranial nerves II-XII grossly intact   Labs:  Recent Labs Lab 11/05/15 0930  11/09/15 0256 11/10/15 0313 11/11/15 0314  NA  --   < > 137 138 138  K  --   < > 4.5 4.8 4.6  CL  --   < > 109 110 109  CO2  --   < > 20* 21* 21*  GLUCOSE  --   < > 147* 225* 163*  BUN  --   < > 45* 68* 72*  CREATININE  --   < > 2.78* 2.98* 3.06*  CALCIUM  --   < > 8.6* 8.6* 8.8*  MG 2.4  --   --   --   --   < > = values in this interval not displayed.    Recent Labs Lab 11/09/15 0256 11/10/15 0313 11/11/15 0314  AST 18 16 17   ALT 15* 15* 16*  ALKPHOS 98 93 101  BILITOT 1.1 0.6 0.8  PROT 5.6* 5.5* 5.3*  ALBUMIN 2.0* 2.0* 2.1*     Recent Labs Lab 11/07/15 0055 11/08/15 1055 11/09/15 0256 11/10/15 0313  11/11/15 0314  WBC 9.8 8.9 8.8 9.2 8.6  HGB 12.4* 12.9* 11.6* 10.8* 10.6*  HCT 38.4* 39.4 35.9* 33.3* 32.9*  MCV 94.6 94.5 94.5 94.1 94.3  PLT 281 298 282 294 284     Recent Labs Lab 11/05/15 0856 11/06/15 1540 11/06/15 1940  TROPONINI 0.09* 0.06* 0.07*      Recent Labs Lab 11/10/15 0600 11/10/15 1105 11/10/15 1629 11/10/15 2229 11/11/15 0650  GLUCAP 222* 184* 208* 194* 159*    ABG    Component Value Date/Time   PHART 7.408 11/07/2015 0235   PCO2ART 30.9 (L) 11/07/2015 0235   PO2ART 71.1 (L) 11/07/2015 0235   HCO3 19.1 (L) 11/07/2015 0235   TCO2 20.1 11/07/2015 0235   ACIDBASEDEF 4.7 (H) 11/07/2015 0235   O2SAT 94.4 11/07/2015 0235   TAC trough level from 8/5 pending  Studies/Results: Dg Chest Port 1 View  Result Date: 11/09/2015 CLINICAL DATA:  Shortness  of breath EXAM: PORTABLE CHEST 1 VIEW COMPARISON:  November 06, 2015 FINDINGS: Mild cardiomegaly. The hila and mediastinum are unchanged. Diffuse bilateral interstitial opacities again identified and similar in the interval. The opacity is a little more focal in the left suprahilar region which is increased since November 05, 2015 but unchanged since November 06, 2015. No other acute interval changes. There may be tiny bilateral effusions with blunting of the costophrenic angles. IMPRESSION: 1. Suspected tiny bilateral pleural effusions and diffuse increased interstitial markings are most consistent with pulmonary edema. Atypical viral infection could have a similar appearance. Recommend clinical correlation. *Opacity in the left suprahilar region is a little more focal when compared November 05, 2015. The difference could be due to confluence of shadows. Recommend follow-up to resolution. Electronically Signed   By: Dorise Bullion III M.D   On: 11/09/2015 09:14   Scheduled Meds: . allopurinol  150 mg Oral Daily  . amLODipine  10 mg Oral Daily  . aspirin  81 mg Oral Daily  . calcitRIOL  0.5 mcg Oral Daily  . docusate sodium   100 mg Oral Daily  . famotidine  20 mg Oral QHS  . feeding supplement (GLUCERNA SHAKE)  237 mL Oral Q1500  . feeding supplement (PRO-STAT SUGAR FREE 64)  30 mL Oral BID BM  . furosemide  160 mg Intravenous Q8H  . hydrALAZINE  25 mg Oral Q8H  . insulin aspart  0-15 Units Subcutaneous TID WC  . insulin aspart  5 Units Subcutaneous TID WC  . insulin glargine  20 Units Subcutaneous QHS  . levothyroxine  137 mcg Oral QAC breakfast  . magnesium oxide  400 mg Oral BID  . metolazone  5 mg Oral Daily  . metoprolol succinate  100 mg Oral Daily  . phosphorus  250 mg Oral BID  . potassium chloride  20 mEq Oral BID  . predniSONE  5 mg Oral Daily  . rosuvastatin  10 mg Oral QPM  . saccharomyces boulardii  250 mg Oral BID  . tacrolimus  1 mg Oral BID  . Warfarin - Pharmacist Dosing Inpatient   Does not apply q1800   PRN Meds alum & mag hydroxide-simeth, guaiFENesin-dextromethorphan, hydrALAZINE, HYDROcodone-acetaminophen, labetalol, levalbuterol, metoprolol, ondansetron (ZOFRAN) IV, phenol  Infusion Meds   Background: 75 y.o. male.  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  Creatine baseline appears to be 1.8-2.0.  Primary nephrologist is Dr. Justin Mend. Saw him on 06/26/2015 with BUN 27, creatinine 1.88.  Admitted on 10/28/2015 with necrotic left great toe with cellulitis. Creatinine on admission was 2.6.  Seen by [name] in consult on 7/25.  Creatinine had been improving until last couple days when started to trend back up post aortogram and in setting of being up 13 liters since admission.  Assessment/Plan:  Renal transplant (2005)  with AKI on CKD4 post contrast administration - Baseline creatinine 1.88-2 Timing of increased creatinine coincides with aortogram on August 1, however, atheroembolic event remains possible as well given his stuttering creatinine increases. Renal  allograft function just a little worse today with Creatinine 3.06 from 2.98. Hopefully we are beginning to see a peak in his rise.  Diuresis has not been striking thus far. Dyspnea better, still volume overloaded. UOP recorded yesterday as 800cc, none so far today.  Lasix has been increased to 160mg  IV TID Metolazone increased to 5mg  yesterday. Electrolytes  stable  - repeat Tacrolimus level is processing. - continue furosemide 160 mg IV Q8H -- could consider increasing to Q6H - continue metolazone at current 5mg  daily dose. - may be seeing a decline in function of his transplant kidney  CKD-MBD  On calcitriol 0.23mcg daily, phosphorous 250mg  BID PTH on 7/25 was 82  HTN/Volume:   BP has improved over past 24 hours.   Still volume overloaded.   Hydralazine TID added in addition to amlodipine and Lasix.   Cardiology following.   Continuing current medications  DM Per primary service  AFib:  BB for rate control, on heparin, coumadin per VVS  Gangrene/Cellulitis/Toe Amputation:   S/p atherectomy and DCB L popliteal art, and PTA left anterior tibial artery 11/05/2015.   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   Off abx VVS following.  HLD  Hypothyroidism   Jule Ser  8:11 AM   Renal Attending: Renal fct. not improved. Renal allograft in LLQ is sl tender on exam.  Will increase dose of steroids for any component of acute rejection. Makenley Shimp C

## 2015-11-11 NOTE — Progress Notes (Signed)
Physical Therapy Treatment Patient Details Name: Mark [name] MRN: [id number] DOB: [birth year] Today's Date: 11/11/2015    History of Present Illness 76 y.o. male s/p L great toe amputation. PMH significant HTN, afib on Coumadin, ESRD s/p kidney tranplant 12 years ago, and DM type II.    PT Comments    Pt admitted with above diagnosis. Pt currently with functional limitations due to balance and endurance deficits. Pt was able to stand and pivot to recliner with assist.  Had some incontinence of bowel and bladder that hindered ambulation.  Pt fatigues quickly.  5/5 goals unmet due to pts slow progress and waiting on shoe for heel weight bearing.  Goals revised.    Pt will benefit from skilled PT to increase their independence and safety with mobility to allow discharge to the venue listed below.    Follow Up Recommendations  SNF;Supervision - Intermittent     Equipment Recommendations  Rolling walker with 5" wheels    Recommendations for Other Services       Precautions / Restrictions Precautions Precautions: Fall Precaution Comments: History of peripheral neuropathy. Pt states he can't really feel his feet. Required Braces or Orthoses: Other Brace/Splint Other Brace/Splint: Darco shoe Restrictions Weight Bearing Restrictions: Yes RLE Weight Bearing: Weight bearing as tolerated LLE Weight Bearing: Partial weight bearing (heel weight bearing only per order with Darco shoe)    Mobility  Bed Mobility Overal bed mobility: Needs Assistance Bed Mobility: Supine to Sit     Supine to sit: Mod assist;+2 for physical assistance     General bed mobility comments: Mod assist for trunk support to come to sitting position. VCs for hand placement.  Transfers Overall transfer level: Needs assistance Equipment used: Rolling walker (2 wheeled) Transfers: Sit to/from [facility] Sit to Stand: Mod assist;+2 safety/equipment;From elevated surface Stand pivot transfers:  Mod assist;+2 safety/equipment       General transfer comment: Pt needed assist to power up.  Pt needed cues to sequence steps and RW.  Pt began urinating and having a BM once he started taking pivotal steps.  Got a bedpan because that is all that was close and tried to catch the urine and BM without full success.  Cleaned pt and linens.  Pt had to stand for up to 5 min to be cleaned and changed.  Pt very tired after doing this so did not walk.  NT assisted this PT with cleaning pt while PT gave cues and assist for standing.     Ambulation/Gait                 Stairs            Wheelchair Mobility    Modified Rankin (Stroke Patients Only)       Balance Overall balance assessment: Needs assistance Sitting-balance support: No upper extremity supported;Feet supported Sitting balance-Leahy Scale: Good     Standing balance support: Bilateral upper extremity supported;During functional activity Standing balance-Leahy Scale: Poor Standing balance comment: relies heavily on RW especially as pt fatigued.                     Cognition Arousal/Alertness: Awake/alert Behavior During Therapy: WFL for tasks assessed/performed Overall Cognitive Status: Within Functional Limits for tasks assessed           Safety/Judgement: Decreased awareness of safety          Exercises      General Comments General comments (skin integrity, edema, etc.): Pt was  able to stand and pivot with Darco shoe in  place.  Once pt in chair, positioned comfortably.        Pertinent Vitals/Pain Pain Assessment: Faces Faces Pain Scale: Hurts a little bit Pain Location: LLE Pain Descriptors / Indicators: Aching;Grimacing;Guarding Pain Intervention(s): Limited activity within patient's tolerance;Monitored during session;Repositioned  VSS with sats >90% on O2.  Home Living                      Prior Function            PT Goals (current goals can now be found in the care  plan section) Acute Rehab PT Goals Patient Stated Goal: go home, not a rest home PT Goal Formulation: With patient Time For Goal Achievement: 11/25/15 Potential to Achieve Goals: Good Progress towards PT goals: Progressing toward goals    Frequency  Min 2X/week    PT Plan Current plan remains appropriate    Co-evaluation             End of Session Equipment Utilized During Treatment: Gait belt;Oxygen Activity Tolerance: Patient limited by fatigue Patient left: in chair;with call bell/phone within reach;with chair alarm set;with nursing/sitter in room     Time: 1420-1502 PT Time Calculation (min) (ACUTE ONLY): 42 min  Charges:  $Therapeutic Activity: 23-37 mins $Self Care/Home Management: 8-22                    G CodesDenice Paradise Dec 09, 2015, 4:57 PM  [facility] [facility] Acute Rehabilitation [phone] (pager)

## 2015-11-11 NOTE — Progress Notes (Signed)
ANTICOAGULATION CONSULT NOTE - Follow Up Consult  Pharmacy Consult for Heparin/Coumadin Indication: afib   Allergies  Allergen Reactions  . Tape Other (See Comments)    SKIN WILL TEAR!!    Patient Measurements: Height: 5\' 9"  (175.3 cm) Weight: 242 lb 8.1 oz (110 kg) IBW/kg (Calculated) : 70.7  Vital Signs: Temp: 97.5 F (36.4 C) (08/07 0448) Temp Source: Oral (08/07 0448) BP: 144/80 (08/07 0448) Pulse Rate: 73 (08/07 0448)  Labs:  Recent Labs  11/09/15 0256 11/10/15 0313 11/11/15 0314  HGB 11.6* 10.8* 10.6*  HCT 35.9* 33.3* 32.9*  PLT 282 294 284  LABPROT 22.0* 32.6* 38.6*  INR 1.90 3.10 3.83  HEPARINUNFRC 0.40 0.57 <0.10*  CREATININE 2.78* 2.98* 3.06*    Estimated Creatinine Clearance: 25.5 mL/min (by C-G formula based on SCr of 3.06 mg/dL).   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 Heparin started 8/3, PTA warfarin restarted 8/4. INR currently supratherapeutic with large increase 1.9>3.1>3.83.  **Warfarin PTA dosing: 5mg  daily except 2.5mg  on Tuesdays and Thursdays.    Goal of Therapy:  INR 2-3 Monitor platelets by anticoagulation protocol: Yes    Plan:  -Hold warfarin tonight -Daily INR  Hildred Laser, Pharm D 11/11/2015 8:45 AM

## 2015-11-11 NOTE — Progress Notes (Signed)
Nutrition Consult/Follow Up  DOCUMENTATION CODES:   Obesity unspecified  INTERVENTION:    D/C Prostat liquid protein   D/C Glucerna Shake   Add Boost Breeze po TID, each supplement provides 250 kcal and 9 grams of protein  NUTRITION DIAGNOSIS:   Increased nutrient needs related to wound healing as evidenced by estimated needs; ongoing  GOAL:   Patient will meet greater than or equal to 90% of their needs; progressing  MONITOR:   PO intake, Supplement acceptance, Weight trends, Labs, Skin, I & O's  ASSESSMENT:   75 y.o. 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  Patient presents to ED with c/o 'his toe is dead'.     Patient s/p procedure 8/2: AMPUTATION RIGHT FIRST TOE WITH RESECTION OF METATARSAL HEAD  RD consulted for poor PO intake. Pt working with PT upon visit. S/p bedside swallow evaluation 8/3 >> SLP recommending Regular, thin liquids. PO intake variable at 0-100% per flowsheet records. Per MAR, pt refusing Glucerna Shake and Prostat liquid protein supplements. Will D/C and have him try Boost Breeze supplement (renal-friendly).  Diet Order:  Diet renal/carb modified with fluid restriction Diet-HS Snack? Nothing; Room service appropriate? Yes; Fluid consistency: Thin  Skin:  Wound (see comment) (diabetic ulcer L great toe)  Last BM:  8/6  Height:   Ht Readings from Last 1 Encounters:  10/28/15 5\' 9"  (1.753 m)    Weight:   Wt Readings from Last 1 Encounters:  11/06/15 242 lb 8.1 oz (110 kg)    Ideal Body Weight:  72.7 kg  BMI:  Body mass index is 35.81 kg/m.  Estimated Nutritional Needs:   Kcal:  [id number]  Protein:  105-125 grams  Fluid:  2-2.3 L/day  EDUCATION NEEDS:   No education needs identified at this time  Arthur Holms, RD, LDN Pager #: [phone] After-Hours Pager #: [phone]

## 2015-11-11 NOTE — Progress Notes (Signed)
  Progress Note    11/11/2015 7:59 AM 5 Days Post-Op  Subjective:  No having any pain in his amp site  afebrile  Vitals:   11/10/15 2223 11/11/15 0448  BP: 138/78 (!) 144/80  Pulse: 71 73  Resp: 18 18  Temp: 98 F (36.7 C) 97.5 F (36.4 C)    Physical Exam: Extremities:  good granulation tissue deep and anterior; some fibrinous tissue posterior.   CBC    Component Value Date/Time   WBC 8.6 11/11/2015 0314   RBC 3.49 (L) 11/11/2015 0314   HGB 10.6 (L) 11/11/2015 0314   HCT 32.9 (L) 11/11/2015 0314   PLT 284 11/11/2015 0314   MCV 94.3 11/11/2015 0314   MCH 30.4 11/11/2015 0314   MCHC 32.2 11/11/2015 0314   RDW 16.3 (H) 11/11/2015 0314   LYMPHSABS 1.9 10/28/2015 1919   MONOABS 0.9 10/28/2015 1919   EOSABS 0.0 10/28/2015 1919   BASOSABS 0.0 10/28/2015 1919    BMET    Component Value Date/Time   NA 138 11/11/2015 0314   K 4.6 11/11/2015 0314   CL 109 11/11/2015 0314   CO2 21 (L) 11/11/2015 0314   GLUCOSE 163 (H) 11/11/2015 0314   BUN 72 (H) 11/11/2015 0314   CREATININE 3.06 (H) 11/11/2015 0314   CALCIUM 8.8 (L) 11/11/2015 0314   GFRNONAA 18 (L) 11/11/2015 0314   GFRAA 21 (L) 11/11/2015 0314    INR    Component Value Date/Time   INR 3.83 11/11/2015 0314     Intake/Output Summary (Last 24 hours) at 11/11/15 0759 Last data filed at 11/11/15 0450  Gross per 24 hour  Intake              120 ml  Output              800 ml  Net             -680 ml     Assessment:  75 y.o. male is s/p:  S/P atherectomy and DCB Left popliteal artery and PTA left ATA (POD 6) S/P Ray amputation of left great toe (POD 5)  Plan: -pt has good granulation tissue deep and anterior; some fibrinous tissue posterior. -overall, wound is healing -continue wet to dry dressing changes -DVT prophylaxis:  coumadin   Leontine Locket, PA-C [facility] [phone] 11/11/2015 7:59 AM

## 2015-11-11 NOTE — Progress Notes (Signed)
Patient ID: Mark [name], male   DOB: June 08, 1940, 75 y.o.   MRN: [id number]                                                                PROGRESS NOTE                                                                                                                                                                                                             Patient Demographics:    Mark [name], is a 75 y.o. male, DOB - 03-08-1941, [id number]  Admit date - 10/28/2015   Admitting Physician Etta Quill, DO  Outpatient Primary MD for the patient is Rochel Brome, MD  LOS - 14  Outpatient Specialists:   Chief Complaint  Patient presents with  . 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 Patient presents to ED with c/o 'his toe is dead'. Creatinine baseline appears to be 1.8-2.3.  Primary nephrologist is Dr. Justin Mend.  Saw him on 06/26/2015 with BUN 27, creatinine 1.88.  Admitted on 10/28/2015 with necrotic left great toe with cellulitis.  Creatinine on admission was 2.6.  Seen by [name] in consult on 7/25.  His Prograf level was found to be low and was increased.  Patient underwent abdominal aortogram with angioplasty of the left anterior tibial artery and left popliteal artery on 11/05/2015.  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  He was on Vancomycin and Zosyn from 7/24-8/3.  Currently only on Zosyn.  He developed some dyspnea in the setting of being positive about 13 liters since admission with evidence of volume overload on exam   Subjective:    Mark [name]  S/p amputation, very  SOB , no cp    Assessment  & Plan :    Principal Problem:   Gangrene of toe ([place]) Active Problems:   Type II diabetes mellitus, uncontrolled ([place])   Renal transplant recipient   Diabetic ulcer of left great toe ([place])   Cellulitis of great toe of  left foot   Aspiration of liquid   SOB (shortness of breath)   Permanent atrial fibrillation ([facility])   Bibasilar  crackles   CHF (congestive heart failure) ([facility])   Shortness of breath, Suspect acute on chronic diastolic heart failure,Breathing better this morning and resting comfortably Likely secondary to pulmonary vascular congestion , cardiology and nephrology managing diuretics Chest x-ray shows mild congestive changes,      Renal allograft function just a little worse today with Creatinine 3.06 from 2.98. Hopefully we are beginning to see a peak in his rise  2-D echo did not show any wall motion abnormalities and the patient does not appear to have any ischemic changes on his EKG    Gangrene of left  Toe/Toe Amputation:  S /P atherectomy and DCB Left popliteal artery and PTA left ATA (POD 5) S/P Ray amputation of left great toe (POD 4) Palpable Left popliteal pulse Toe amp site looks ok. Continue dressing changes   VVS following .ABI shows no blood flow to the left toe,  Situation complicated by renal transplant with elevated creatinine, Coumadin resumed, patient currently on a heparin drip as INR 1.9 Received clearance from cardiology according to [facility] and AHA guidelines   no further cardiac workup was done prior to his noncardiac surgery  Wound culture shows gram-positive cocci in clusters, however blood culture has been negative so far, if margins are clear doubt  patient needs to continue with antibiotics at this time especially in the setting of diarrhea Vanco random  level 8/5 of 19, and no dose of vancomycin since 8/2 PM.  Per nephro,   d/c vancomycin order completely 8.5    Diarrhea-C. difficile negative 2 during this admission, improved, continue probiotic   Acute on Chronic kidney disease stage IV, status post renal transplant , baseline creatinine around 1.7, now 3. Patient is status post kidney transplant in 2005 at [facility] Nephrology consulted to monitor patient's renal  function in the setting of   procedure/angiogram.Continue anti-rejection meds, Prograf level was low and [name] increased prograf,   Timing of increased creatinine coincides with aortogram on August 1. Repeat Prograf level pending, currently creatinine is trending up, nephrology following   A fib with with aberrancy - resumed coumadin, heparin per pharmacy, rate controlled , preop 2-D echo shows grade 2 diastolic dysfunction, pulmonary hypertension, EKG/telemetry shows A. fib rate controlled, continue metoprolol, Cardiology consulted for preop clearance ,  He is having some aberrancy on telemetry. Continue beta blocker for rate control. Digoxin DCed. Continue heparin; resume coumadin per  vascular surgery   DM2 - continue Lantus at 20 units since creatinine is trending up, will continue with the same dose of Lantus, contpre-meal NovoLog 5 units.Moderate dose mealtime and SSI AC/HS,     Hemoglobin A1c 8.2  Hypertension: uncontrolled Continue norvasc to 10mg  po qday , metoprolol Prn hydralazine  Hypothyroidism-continue Synthroid  Protein Calorie Malnutrition prostat  Dyslipidemia-continue Crestor  Pulmonary hypertension:  [name] has moderate pulmonary hypertension.   cardiology Recommend outpatient sleep study and PFTs.  This may also be due to left-sided heart disease. He does have grade 2 diastolic dysfunction. Recommend diuresis as per nephrology.  There is no evidence of liver disease by laboratory testing   Code Status : FULL CODE  Family Communication  :   Disposition Plan  :  ? Rehab vs home  Barriers For Discharge : renal function  Consults  :  Vascular surgery, nephrology, cardiology  Procedures  : Atherectomy and drug coated balloon angioplasty 8/1 , toe amputation 8/2  DVT Prophylaxis  :  Heparin     Lab Results  Component Value  Date   PLT 284 11/11/2015    Antibiotics  :     10/28/15 2200  piperacillin-tazobactam (ZOSYN) IVPB 3.375 g   Status:  Discontinued     3.375 g 12.5 mL/hr over 240 Minutes Intravenous Every 8 hours 10/28/15 2120 11/09/15 1057   10/28/15 2200  vancomycin (VANCOCIN) 2,500 mg in sodium chloride 0.9 % 500 mL IVPB     2,500 mg 250 mL/hr over 120 Minutes Intravenous  Once 10/28/15 2154 10/29/15 2330        Objective:   Vitals:   11/10/15 1527 11/10/15 2223 11/11/15 0448 11/11/15 1201  BP: 109/71 138/78 (!) 144/80 130/90  Pulse: 67 71 73 70  Resp: 18 18 18    Temp: 97.6 F (36.4 C) 98 F (36.7 C) 97.5 F (36.4 C) 97.4 F (36.3 C)  TempSrc: Oral Oral Oral Oral  SpO2: 95% 94% 97% 100%  Weight:      Height:        Wt Readings from Last 3 Encounters:  11/06/15 110 kg (242 lb 8.1 oz)  09/05/15 103.1 kg (227 lb 3.2 oz)  08/05/15 101.8 kg (224 lb 6.4 oz)     Intake/Output Summary (Last 24 hours) at 11/11/15 1433 Last data filed at 11/11/15 0450  Gross per 24 hour  Intake              120 ml  Output              400 ml  Net             -280 ml     Physical Exam somnolent, No new F.N deficits, Normal affect [facility].AT,PERRAL Supple Neck,No JVD, No cervical lymphadenopathy appriciated.  Symmetrical Chest wall movement, Good air movement bilaterally, CTAB RRR,No Gallops,Rubs or new Murmurs, No Parasternal Heave +ve B.Sounds, Abd Soft, No tenderness, No organomegaly appriciated, No rebound - guarding or rigidity. No edema.  1st left foot black.    Data Review:    CBC  Recent Labs Lab 11/07/15 0055 11/08/15 1055 11/09/15 0256 11/10/15 0313 11/11/15 0314  WBC 9.8 8.9 8.8 9.2 8.6  HGB 12.4* 12.9* 11.6* 10.8* 10.6*  HCT 38.4* 39.4 35.9* 33.3* 32.9*  PLT 281 298 282 294 284  MCV 94.6 94.5 94.5 94.1 94.3  MCH 30.5 30.9 30.5 30.5 30.4  MCHC 32.3 32.7 32.3 32.4 32.2  RDW 15.5 15.4 15.7* 15.7* 16.3*    Chemistries   Recent Labs Lab 11/05/15 0930  11/07/15 0317 11/08/15 1055 11/09/15 0256 11/10/15 0313 11/11/15 0314  NA  --   < > 139 139 137 138 138  K  --   < > 4.3 4.2 4.5  4.8 4.6  CL  --   < > 111 109 109 110 109  CO2  --   < > 20* 21* 20* 21* 21*  GLUCOSE  --   < > 141* 130* 147* 225* 163*  BUN  --   < > 30* 37* 45* 68* 72*  CREATININE  --   < > 2.44* 2.52* 2.78* 2.98* 3.06*  CALCIUM  --   < > 8.5* 8.7* 8.6* 8.6* 8.8*  MG 2.4  --   --   --   --   --   --   AST  --   < > 25 21 18 16 17   ALT  --   < > 17 16* 15* 15* 16*  ALKPHOS  --   < > 87 93 98 93 101  BILITOT  --   < >  0.9 0.9 1.1 0.6 0.8  < > = values in this interval not displayed. ------------------------------------------------------------------------------------------------------------------ No results for input(s): CHOL, HDL, LDLCALC, TRIG, CHOLHDL, LDLDIRECT in the last 72 hours.  Lab Results  Component Value Date   HGBA1C 8.2 (H) 10/29/2015   ------------------------------------------------------------------------------------------------------------------ No results for input(s): TSH, T4TOTAL, T3FREE, THYROIDAB in the last 72 hours.  Invalid input(s): FREET3 ------------------------------------------------------------------------------------------------------------------ No results for input(s): VITAMINB12, FOLATE, FERRITIN, TIBC, IRON, RETICCTPCT in the last 72 hours.  Coagulation profile  Recent Labs Lab 11/07/15 0317 11/08/15 1055 11/09/15 0256 11/10/15 0313 11/11/15 0314  INR 1.73 1.69 1.90 3.10 3.83    No results for input(s): DDIMER in the last 72 hours.  Cardiac Enzymes  Recent Labs Lab 11/05/15 0856 11/06/15 1540 11/06/15 1940  TROPONINI 0.09* 0.06* 0.07*   ------------------------------------------------------------------------------------------------------------------ No results found for: BNP  Inpatient Medications  Scheduled Meds: . allopurinol  150 mg Oral Daily  . amLODipine  10 mg Oral Daily  . aspirin  81 mg Oral Daily  . calcitRIOL  0.5 mcg Oral Daily  . docusate sodium  100 mg Oral Daily  . famotidine  20 mg Oral QHS  . feeding supplement  (GLUCERNA SHAKE)  237 mL Oral Q1500  . feeding supplement (PRO-STAT SUGAR FREE 64)  30 mL Oral BID BM  . furosemide  160 mg Intravenous Q8H  . hydrALAZINE  25 mg Oral Q8H  . insulin aspart  0-15 Units Subcutaneous TID WC  . insulin aspart  5 Units Subcutaneous TID WC  . insulin glargine  20 Units Subcutaneous QHS  . levothyroxine  137 mcg Oral QAC breakfast  . magnesium oxide  400 mg Oral BID  . metolazone  5 mg Oral Daily  . metoprolol succinate  100 mg Oral Daily  . phosphorus  250 mg Oral BID  . potassium chloride  20 mEq Oral BID  . predniSONE  25 mg Oral Once  . [START ON 11/12/2015] predniSONE  30 mg Oral Daily  . rosuvastatin  10 mg Oral QPM  . saccharomyces boulardii  250 mg Oral BID  . tacrolimus  1 mg Oral BID  . Warfarin - Pharmacist Dosing Inpatient   Does not apply q1800   Continuous Infusions:   PRN Meds:.alum & mag hydroxide-simeth, guaiFENesin-dextromethorphan, hydrALAZINE, HYDROcodone-acetaminophen, labetalol, levalbuterol, metoprolol, ondansetron (ZOFRAN) IV, phenol  Micro Results Recent Results (from the past 240 hour(s))  C difficile quick scan w PCR reflex     Status: None   Collection Time: 11/04/15  9:42 AM  Result Value Ref Range Status   C Diff antigen NEGATIVE NEGATIVE Final   C Diff toxin NEGATIVE NEGATIVE Final   C Diff interpretation No C. difficile detected.  Final  Surgical PCR screen     Status: Abnormal   Collection Time: 11/05/15 11:18 PM  Result Value Ref Range Status   MRSA, PCR NEGATIVE NEGATIVE Final   Staphylococcus aureus POSITIVE (A) NEGATIVE Final    Comment:        The Xpert SA Assay (FDA approved for NASAL specimens in patients over 68 years of age), is one component of a comprehensive surveillance program.  Test performance has been validated by [facility] for patients greater than or equal to 63 year old. It is not intended to diagnose infection nor to guide or monitor treatment.   Fungus Culture With Stain (Not @ [facility])      Status: None (Preliminary result)   Collection Time: 11/06/15  2:33 PM  Result Value Ref Range Status  Fungus Stain Final report  Final    Comment: (NOTE) Performed At: [facility] [place], [place] [id number] Lindon Romp MD [id number]    Fungus (Mycology) Culture PENDING  Incomplete   Fungal Source WOUND  Final    Comment: LEFT TOE POF VANC AND ZOSYN   Acid Fast Smear (AFB)     Status: None   Collection Time: 11/06/15  2:33 PM  Result Value Ref Range Status   AFB Specimen Processing Concentration  Final   Acid Fast Smear Negative  Final    Comment: (NOTE) Performed At: [facility] [place], [place] [id number] Lindon Romp MD [id number]    Source (AFB) WOUND  Final    Comment: LEFT TOE POF VANC AND ZOSYN   Aerobic Culture (superficial specimen)     Status: None   Collection Time: 11/06/15  2:33 PM  Result Value Ref Range Status   Specimen Description WOUND LEFT TOE  Final   Special Requests GREAT TOE POF VANC AND ZOSYN  Final   Gram Stain   Final    NO WBC SEEN FEW SQUAMOUS EPITHELIAL CELLS PRESENT FEW GRAM POSITIVE COCCI IN CLUSTERS    Culture MULTIPLE ORGANISMS PRESENT, NONE PREDOMINANT  Final   Report Status 11/08/2015 FINAL  Final  Fungus Culture Result     Status: None   Collection Time: 11/06/15  2:33 PM  Result Value Ref Range Status   Result 1 Comment  Final    Comment: (NOTE) KOH/Calcofluor preparation:  no fungus observed. Performed At: [facility] [place], [place] [id number] Lindon Romp MD [id number]   C difficile quick scan w PCR reflex     Status: None   Collection Time: 11/09/15  4:08 PM  Result Value Ref Range Status   C Diff antigen NEGATIVE NEGATIVE Final   C Diff toxin NEGATIVE NEGATIVE Final   C Diff interpretation No C. difficile detected.  Final    Radiology Reports Dg Chest Port 1 View  Result Date: 11/09/2015 CLINICAL DATA:  Shortness of  breath EXAM: PORTABLE CHEST 1 VIEW COMPARISON:  November 06, 2015 FINDINGS: Mild cardiomegaly. The hila and mediastinum are unchanged. Diffuse bilateral interstitial opacities again identified and similar in the interval. The opacity is a little more focal in the left suprahilar region which is increased since November 05, 2015 but unchanged since November 06, 2015. No other acute interval changes. There may be tiny bilateral effusions with blunting of the costophrenic angles. IMPRESSION: 1. Suspected tiny bilateral pleural effusions and diffuse increased interstitial markings are most consistent with pulmonary edema. Atypical viral infection could have a similar appearance. Recommend clinical correlation. *Opacity in the left suprahilar region is a little more focal when compared November 05, 2015. The difference could be due to confluence of shadows. Recommend follow-up to resolution. Electronically Signed   By: Dorise Bullion III M.D   On: 11/09/2015 09:14   Dg Chest Port 1 View  Result Date: 11/06/2015 CLINICAL DATA:  75 year old male with increasing shortness of breath and abnormal left side pulmonary auscultation. Initial encounter. EXAM: PORTABLE CHEST 1 VIEW COMPARISON:  0508 hours today and earlier. FINDINGS: Portable AP semi upright view at 2127 hours. Continued and mildly increased perihilar and basilar predominant interstitial pulmonary opacity. Stable cardiac size and mediastinal contours. No pneumothorax. No pleural effusion or consolidation identified. IMPRESSION: Increased bilateral pulmonary interstitial opacity. Top differential considerations include progression of interstitial edema versus viral/ atypical respiratory infection. No pleural  effusion identified. Electronically Signed   By: Genevie Ann M.D.   On: 11/06/2015 21:42   Dg Chest Port 1 View  Result Date: 11/06/2015 CLINICAL DATA:  75 year old male with aspiration of liquid. EXAM: PORTABLE CHEST 1 VIEW COMPARISON:  Chest radiograph dated 11/05/2015  FINDINGS: Mild cardiomegaly with central vascular congestion. Bibasilar Interstitial prominence and nodular densities, new from prior study may represent atelectatic changes versus infiltrate. There is no pleural effusion or pneumothorax. Top-normal cardiac silhouette. No acute osseous pathology. IMPRESSION: Mild congestive changes with bibasilar subsegmental atelectasis versus infiltrate. Electronically Signed   By: Anner Crete M.D.   On: 11/06/2015 05:16   Dg Chest Port 1 View  Result Date: 11/05/2015 CLINICAL DATA:  Shortness of breath EXAM: PORTABLE CHEST 1 VIEW COMPARISON:  March 14, 2015 FINDINGS: The lungs are clear. Heart is mildly enlarged with pulmonary vascularity within normal limits. No adenopathy. No bone lesions. IMPRESSION: Mild cardiac enlargement.  No edema or consolidation. Electronically Signed   By: Lowella Grip III M.D.   On: 11/05/2015 09:42   Dg Foot Complete Left  Result Date: 10/28/2015 CLINICAL DATA:  Left big toe pain. EXAM: LEFT FOOT - COMPLETE 3+ VIEW COMPARISON:  None. FINDINGS: There is no evidence of fracture or dislocation. No evidence of dystrophic bony changes. There is soft tissue swelling of the distal first digit. Punctate lucencies within the area of soft tissue swelling may represent entrapped fat or potentially foci of gas. Heavy vascular calcifications are seen. IMPRESSION: No evidence of fracture of the left foot. Soft tissue swelling of the distal first digit with punctate lucencies within the area of swelling, which may represent entrapped fat or potentially foci of gas. Please correlate clinically regarding the possibility of cellulitis. Electronically Signed   By: Fidela Salisbury M.D.   On: 10/28/2015 21:01   Time Spent in minutes  17   Jamirah Zelaya M.D on 11/11/2015 at 2:33 PM  Between 7am to 7pm - Pager - [phone]  After 7pm go to [email or web address] - password [facility]  [facility] -  Office  [phone]

## 2015-11-11 NOTE — Care Management Important Message (Signed)
Important Message  Patient Details  Name: CAMIEL GREGER MRN: [id number] Date of Birth: Nov 09, 1940   Medicare Important Message Given:  Yes    Donis Pinder Abena 11/11/2015, 11:02 AM

## 2015-11-11 NOTE — Progress Notes (Signed)
SUBJECTIVE:  Feels better  OBJECTIVE:   Vitals:   Vitals:   11/10/15 1527 11/10/15 2223 11/11/15 0448 11/11/15 1201  BP: 109/71 138/78 (!) 144/80 130/90  Pulse: 67 71 73 70  Resp: 18 18 18    Temp: 97.6 F (36.4 C) 98 F (36.7 C) 97.5 F (36.4 C) 97.4 F (36.3 C)  TempSrc: Oral Oral Oral Oral  SpO2: 95% 94% 97% 100%  Weight:      Height:       I&O's:   Intake/Output Summary (Last 24 hours) at 11/11/15 1325 Last data filed at 11/11/15 0450  Gross per 24 hour  Intake              120 ml  Output              400 ml  Net             -280 ml   TELEMETRY: Reviewed telemetry pt in Afib, rate controlled:     PHYSICAL EXAM General: Well developed, well nourished, in no acute distress Head:   Normal cephalic and atramatic  Lungs:  No wheezing bilaterally to auscultation. Heart:  irregualrly irregular S1 S2  No JVD.   Abdomen: abdomen soft and non-tender Msk:  Back normal,  Normal strength and tone for age. Extremities:  Bilateral leg edema.   Neuro: Alert and oriented. Psych:  Normal affect, responds appropriately Skin: No rash   LABS: Basic Metabolic Panel:  Recent Labs  11/10/15 0313 11/11/15 0314  NA 138 138  K 4.8 4.6  CL 110 109  CO2 21* 21*  GLUCOSE 225* 163*  BUN 68* 72*  CREATININE 2.98* 3.06*  CALCIUM 8.6* 8.8*   Liver Function Tests:  Recent Labs  11/10/15 0313 11/11/15 0314  AST 16 17  ALT 15* 16*  ALKPHOS 93 101  BILITOT 0.6 0.8  PROT 5.5* 5.3*  ALBUMIN 2.0* 2.1*   No results for input(s): LIPASE, AMYLASE in the last 72 hours. CBC:  Recent Labs  11/10/15 0313 11/11/15 0314  WBC 9.2 8.6  HGB 10.8* 10.6*  HCT 33.3* 32.9*  MCV 94.1 94.3  PLT 294 284   Cardiac Enzymes: No results for input(s): CKTOTAL, CKMB, CKMBINDEX, TROPONINI in the last 72 hours. BNP: Invalid input(s): POCBNP D-Dimer: No results for input(s): DDIMER in the last 72 hours. Hemoglobin A1C: No results for input(s): HGBA1C in the last 72 hours. Fasting  Lipid Panel: No results for input(s): CHOL, HDL, LDLCALC, TRIG, CHOLHDL, LDLDIRECT in the last 72 hours. Thyroid Function Tests: No results for input(s): TSH, T4TOTAL, T3FREE, THYROIDAB in the last 72 hours.  Invalid input(s): FREET3 Anemia Panel: No results for input(s): VITAMINB12, FOLATE, FERRITIN, TIBC, IRON, RETICCTPCT in the last 72 hours. Coag Panel:   Lab Results  Component Value Date   INR 3.83 11/11/2015   INR 3.10 11/10/2015   INR 1.90 11/09/2015    RADIOLOGY: Dg Chest Port 1 View  Result Date: 11/09/2015 CLINICAL DATA:  Shortness of breath EXAM: PORTABLE CHEST 1 VIEW COMPARISON:  November 06, 2015 FINDINGS: Mild cardiomegaly. The hila and mediastinum are unchanged. Diffuse bilateral interstitial opacities again identified and similar in the interval. The opacity is a little more focal in the left suprahilar region which is increased since November 05, 2015 but unchanged since November 06, 2015. No other acute interval changes. There may be tiny bilateral effusions with blunting of the costophrenic angles. IMPRESSION: 1. Suspected tiny bilateral pleural effusions and diffuse increased interstitial markings are most  consistent with pulmonary edema. Atypical viral infection could have a similar appearance. Recommend clinical correlation. *Opacity in the left suprahilar region is a little more focal when compared November 05, 2015. The difference could be due to confluence of shadows. Recommend follow-up to resolution. Electronically Signed   By: Dorise Bullion III M.D   On: 11/09/2015 09:14   Dg Chest Port 1 View  Result Date: 11/06/2015 CLINICAL DATA:  75 year old male with increasing shortness of breath and abnormal left side pulmonary auscultation. Initial encounter. EXAM: PORTABLE CHEST 1 VIEW COMPARISON:  0508 hours today and earlier. FINDINGS: Portable AP semi upright view at 2127 hours. Continued and mildly increased perihilar and basilar predominant interstitial pulmonary opacity. Stable  cardiac size and mediastinal contours. No pneumothorax. No pleural effusion or consolidation identified. IMPRESSION: Increased bilateral pulmonary interstitial opacity. Top differential considerations include progression of interstitial edema versus viral/ atypical respiratory infection. No pleural effusion identified. Electronically Signed   By: Genevie Ann M.D.   On: 11/06/2015 21:42   Dg Chest Port 1 View  Result Date: 11/06/2015 CLINICAL DATA:  75 year old male with aspiration of liquid. EXAM: PORTABLE CHEST 1 VIEW COMPARISON:  Chest radiograph dated 11/05/2015 FINDINGS: Mild cardiomegaly with central vascular congestion. Bibasilar Interstitial prominence and nodular densities, new from prior study may represent atelectatic changes versus infiltrate. There is no pleural effusion or pneumothorax. Top-normal cardiac silhouette. No acute osseous pathology. IMPRESSION: Mild congestive changes with bibasilar subsegmental atelectasis versus infiltrate. Electronically Signed   By: Anner Crete M.D.   On: 11/06/2015 05:16   Dg Chest Port 1 View  Result Date: 11/05/2015 CLINICAL DATA:  Shortness of breath EXAM: PORTABLE CHEST 1 VIEW COMPARISON:  March 14, 2015 FINDINGS: The lungs are clear. Heart is mildly enlarged with pulmonary vascularity within normal limits. No adenopathy. No bone lesions. IMPRESSION: Mild cardiac enlargement.  No edema or consolidation. Electronically Signed   By: Lowella Grip III M.D.   On: 11/05/2015 09:42   Dg Foot Complete Left  Result Date: 10/28/2015 CLINICAL DATA:  Left big toe pain. EXAM: LEFT FOOT - COMPLETE 3+ VIEW COMPARISON:  None. FINDINGS: There is no evidence of fracture or dislocation. No evidence of dystrophic bony changes. There is soft tissue swelling of the distal first digit. Punctate lucencies within the area of soft tissue swelling may represent entrapped fat or potentially foci of gas. Heavy vascular calcifications are seen. IMPRESSION: No evidence of  fracture of the left foot. Soft tissue swelling of the distal first digit with punctate lucencies within the area of swelling, which may represent entrapped fat or potentially foci of gas. Please correlate clinically regarding the possibility of cellulitis. Electronically Signed   By: Fidela Salisbury M.D.   On: 10/28/2015 21:01     ASSESSMENT: Mark [name]:    1) AFib: rate controlled. COntinue current meds. 2) CKD: follwed by renal. 3) HTN: Controlled. COntinue current meds.  Jettie Booze, MD  11/11/2015  1:25 PM

## 2015-11-11 NOTE — Progress Notes (Signed)
Inpatient Diabetes Program Recommendations  AACE/ADA: New Consensus Statement on Inpatient Glycemic Control (2015)  Target Ranges:  Prepandial:   less than 140 mg/dL      Peak postprandial:   less than 180 mg/dL (1-2 hours)      Critically ill patients:  140 - 180 mg/dL   Lab Results  Component Value Date   GLUCAP 159 (H) 11/11/2015   HGBA1C 8.2 (H) 10/29/2015    Review of Glycemic Control  Results for CONNARD, THORN (MRN [id number]) as of 11/11/2015 09:18  Ref. Range 11/10/2015 06:00 11/10/2015 11:05 11/10/2015 16:29 11/10/2015 22:29 11/11/2015 06:50  Glucose-Capillary Latest Ref Range: 65 - 99 mg/dL 222 (H) 184 (H) 208 (H) 194 (H) 159 (H)    Diabetes history: DM2 Outpatient Diabetes medications: V-Go insulin pump (78 units R is max)  Current orders for Inpatient glycemic control: Lantus 20 units QHS, Novolog moderate tid + Novolog 5 units tidwc  Patient is eating poorly and therefore  Novolog mealtime insulin is being held. Agree with current medications for blood sugar management.  Gentry Fitz, RN, BA, MHA, CDE Diabetes Coordinator Inpatient Diabetes Program  [phone] (Team Pager) [phone] ([place]) 11/11/2015 9:25 AM

## 2015-11-11 NOTE — Progress Notes (Signed)
Orthopedic Tech Progress Note Patient Details:  Mark [name] 10-21-1940 [id number]  Ortho Devices Type of Ortho Device: Darco shoe Ortho Device/Splint Interventions: Application   [name] 11/11/2015, 2:30 PM

## 2015-11-12 DIAGNOSIS — Z940 Kidney transplant status: Secondary | ICD-10-CM

## 2015-11-12 LAB — GLUCOSE, CAPILLARY
GLUCOSE-CAPILLARY: 160 mg/dL — AB (ref 65–99)
GLUCOSE-CAPILLARY: 209 mg/dL — AB (ref 65–99)
Glucose-Capillary: 200 mg/dL — ABNORMAL HIGH (ref 65–99)
Glucose-Capillary: 334 mg/dL — ABNORMAL HIGH (ref 65–99)

## 2015-11-12 LAB — BASIC METABOLIC PANEL
Anion gap: 10 (ref 5–15)
BUN: 87 mg/dL — ABNORMAL HIGH (ref 6–20)
CALCIUM: 8.8 mg/dL — AB (ref 8.9–10.3)
CO2: 18 mmol/L — ABNORMAL LOW (ref 22–32)
CREATININE: 3.28 mg/dL — AB (ref 0.61–1.24)
Chloride: 107 mmol/L (ref 101–111)
GFR calc non Af Amer: 17 mL/min — ABNORMAL LOW (ref >60)
GFR, EST AFRICAN AMERICAN: 20 mL/min — AB (ref >60)
Glucose, Bld: 152 mg/dL — ABNORMAL HIGH (ref 65–99)
Potassium: 5.6 mmol/L — ABNORMAL HIGH (ref 3.5–5.1)
SODIUM: 135 mmol/L (ref 135–145)

## 2015-11-12 LAB — URINE MICROSCOPIC-ADD ON: RBC / HPF: NONE SEEN RBC/hpf (ref 0–5)

## 2015-11-12 LAB — URINALYSIS, ROUTINE W REFLEX MICROSCOPIC
BILIRUBIN URINE: NEGATIVE
Glucose, UA: NEGATIVE mg/dL
HGB URINE DIPSTICK: NEGATIVE
Ketones, ur: NEGATIVE mg/dL
Nitrite: NEGATIVE
Protein, ur: NEGATIVE mg/dL
SPECIFIC GRAVITY, URINE: 1.016 (ref 1.005–1.030)
pH: 5 (ref 5.0–8.0)

## 2015-11-12 LAB — PROTIME-INR
INR: 3.65
PROTHROMBIN TIME: 37.2 s — AB (ref 11.4–15.2)

## 2015-11-12 LAB — OCCULT BLOOD X 1 CARD TO LAB, STOOL: Fecal Occult Bld: POSITIVE — AB

## 2015-11-12 MED ORDER — SODIUM POLYSTYRENE SULFONATE 15 GM/60ML PO SUSP
15.0000 g | Freq: Once | ORAL | Status: AC
  Administered 2015-11-12: 15 g via ORAL
  Filled 2015-11-12: qty 60

## 2015-11-12 MED ORDER — SODIUM POLYSTYRENE SULFONATE 15 GM/60ML PO SUSP
30.0000 g | Freq: Once | ORAL | Status: AC
  Filled 2015-11-12: qty 120

## 2015-11-12 MED ORDER — POTASSIUM CHLORIDE CRYS ER 20 MEQ PO TBCR: 20.0000 meq | EXTENDED_RELEASE_TABLET | Freq: Two times a day (BID) | ORAL | Status: DC

## 2015-11-12 MED ORDER — FUROSEMIDE 10 MG/ML IJ SOLN
160.0000 mg | Freq: Four times a day (QID) | INTRAVENOUS | Status: DC
  Administered 2015-11-12 – 2015-11-15 (×10): 160 mg via INTRAVENOUS
  Filled 2015-11-12 (×14): qty 16

## 2015-11-12 MED ORDER — SODIUM POLYSTYRENE SULFONATE 15 GM/60ML PO SUSP
30.0000 g | Freq: Once | ORAL | Status: DC
  Filled 2015-11-12: qty 120

## 2015-11-12 NOTE — Progress Notes (Signed)
Changed left great toe dressing and applied wet to dry gauze, Kerlex and Ace wrap.  Patient tolerated well. Site is clean and dry with granulation tissue present. Payton Emerald, RN

## 2015-11-12 NOTE — Progress Notes (Signed)
Hospital Problem List     Principal Problem:   Gangrene of toe ([place]) Active Problems:   Type II diabetes mellitus, uncontrolled ([place])   Renal transplant recipient   Diabetic ulcer of left great toe ([facility])   Cellulitis of great toe of left foot   Aspiration of liquid   SOB (shortness of breath)   Permanent atrial fibrillation ([facility])   Bibasilar crackles   CHF (congestive heart failure) [facility])    Patient Profile:   Primary Cardiologist: New - Dr. 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  Subjective   Denies any chest pain or palpitations overnight. Still having shortness of breath with minimal movement in bed.   Inpatient Medications    . allopurinol  150 mg Oral Daily  . amLODipine  10 mg Oral Daily  . aspirin  81 mg Oral Daily  . calcitRIOL  0.5 mcg Oral Daily  . docusate sodium  100 mg Oral Daily  . famotidine  20 mg Oral QHS  . feeding supplement  1 Container Oral TID WC  . furosemide  160 mg Intravenous Q8H  . hydrALAZINE  25 mg Oral Q8H  . insulin aspart  0-15 Units Subcutaneous TID WC  . insulin aspart  5 Units Subcutaneous TID WC  . insulin glargine  20 Units Subcutaneous QHS  . levothyroxine  137 mcg Oral QAC breakfast  . magnesium oxide  400 mg Oral BID  . metolazone  5 mg Oral Daily  . metoprolol succinate  100 mg Oral Daily  . phosphorus  250 mg Oral BID  . predniSONE  30 mg Oral Daily  . rosuvastatin  10 mg Oral QPM  . saccharomyces boulardii  250 mg Oral BID  . tacrolimus  1 mg Oral BID  . Warfarin - Pharmacist Dosing Inpatient   Does not apply q1800    Vital Signs    Vitals:   11/11/15 0448 11/11/15 1201 11/11/15 2144 11/12/15 0500  BP: (!) 144/80 130/90 (!) 108/56 (!) 119/52  Pulse: 73 70 68 62  Resp: 18  20 18   Temp: 97.5 F (36.4 C) 97.4 F (36.3 C) 97.8 F (36.6 C) 97.7  F (36.5 C)  TempSrc: Oral Oral Oral Oral  SpO2: 97% 100% 96% 99%  Weight:      Height:        Intake/Output Summary (Last 24 hours) at 11/12/15 0706 Last data filed at 11/12/15 0500  Gross per 24 hour  Intake                0 ml  Output              825 ml  Net             -825 ml   Filed Weights   11/03/15 2119 11/04/15 2042 11/06/15 0317  Weight: 223 lb 12.3 oz (101.5 kg) 243 lb 9.7 oz (110.5 kg) 242 lb 8.1 oz (110 kg)    Physical Exam    General: Well developed, well nourished, male appearing in no acute distress. Head: Normocephalic, atraumatic.  Neck: Supple without bruits, JVD not elevated. Lungs:  Resp regular and unlabored, CTA without wheezing or rales. Heart: Irregularly irregular, S1, S2, no S3, S4, or murmur; no rub. Abdomen: Soft, non-tender, non-distended with normoactive bowel sounds. No hepatomegaly. No rebound/guarding.  No obvious abdominal masses. Extremities: No clubbing or cyanosis. 2+ lower extremity pitting edema. Distal pedal pulses are 1+ bilaterally. Left foot wrapped in bandage.  Neuro: Alert and oriented X 3. Moves all extremities spontaneously. Psych: Normal affect.  Labs    CBC  Recent Labs  11/10/15 0313 11/11/15 0314  WBC 9.2 8.6  HGB 10.8* 10.6*  HCT 33.3* 32.9*  MCV 94.1 94.3  PLT 294 [id number]   Basic Metabolic Panel  Recent Labs  11/11/15 0314 11/12/15 0350  NA 138 135  K 4.6 5.6*  CL 109 107  CO2 21* 18*  GLUCOSE 163* 152*  BUN 72* 87*  CREATININE 3.06* 3.28*  CALCIUM 8.8* 8.8*   Liver Function Tests  Recent Labs  11/10/15 0313 11/11/15 0314  AST 16 17  ALT 15* 16*  ALKPHOS 93 101  BILITOT 0.6 0.8  PROT 5.5* 5.3*  ALBUMIN 2.0* 2.1*     Telemetry    Atrial fibrillation, HR in 70's - 80's. Occasional pauses from 1.7 - 2.09 seconds.   ECG    No new tracings.   Cardiac Studies and Radiology    Dg Chest Port 1 View  Result Date: 11/09/2015 CLINICAL DATA:  Shortness of breath EXAM: PORTABLE CHEST 1 VIEW  COMPARISON:  November 06, 2015 FINDINGS: Mild cardiomegaly. The hila and mediastinum are unchanged. Diffuse bilateral interstitial opacities again identified and similar in the interval. The opacity is a little more focal in the left suprahilar region which is increased since November 05, 2015 but unchanged since November 06, 2015. No other acute interval changes. There may be tiny bilateral effusions with blunting of the costophrenic angles. IMPRESSION: 1. Suspected tiny bilateral pleural effusions and diffuse increased interstitial markings are most consistent with pulmonary edema. Atypical viral infection could have a similar appearance. Recommend clinical correlation. *Opacity in the left suprahilar region is a little more focal when compared November 05, 2015. The difference could be due to confluence of shadows. Recommend follow-up to resolution. Electronically Signed   By: Dorise Bullion III M.D   On: 11/09/2015 09:14   Dg Chest Port 1 View  Result Date: 11/06/2015 CLINICAL DATA:  75 year old male with increasing shortness of breath and abnormal left side pulmonary auscultation. Initial encounter. EXAM: PORTABLE CHEST 1 VIEW COMPARISON:  0508 hours today and earlier. FINDINGS: Portable AP semi upright view at 2127 hours. Continued and mildly increased perihilar and basilar predominant interstitial pulmonary opacity. Stable cardiac size and mediastinal contours. No pneumothorax. No pleural effusion or consolidation identified. IMPRESSION: Increased bilateral pulmonary interstitial opacity. Top differential considerations include progression of interstitial edema versus viral/ atypical respiratory infection. No pleural effusion identified. Electronically Signed   By: Genevie Ann M.D.   On: 11/06/2015 21:42   Dg Chest Port 1 View  Result Date: 11/06/2015 CLINICAL DATA:  75 year old male with aspiration of liquid. EXAM: PORTABLE CHEST 1 VIEW COMPARISON:  Chest radiograph dated 11/05/2015 FINDINGS: Mild cardiomegaly with  central vascular congestion. Bibasilar Interstitial prominence and nodular densities, new from prior study may represent atelectatic changes versus infiltrate. There is no pleural effusion or pneumothorax. Top-normal cardiac silhouette. No acute osseous pathology. IMPRESSION: Mild congestive changes with bibasilar subsegmental atelectasis versus infiltrate. Electronically Signed   By: Anner Crete M.D.   On: 11/06/2015 05:16   Dg Chest Port 1 View  Result Date: 11/05/2015 CLINICAL DATA:  Shortness of breath EXAM: PORTABLE CHEST 1 VIEW COMPARISON:  March 14, 2015 FINDINGS: The lungs are clear. Heart is mildly enlarged with pulmonary vascularity  within normal limits. No adenopathy. No bone lesions. IMPRESSION: Mild cardiac enlargement.  No edema or consolidation. Electronically Signed   By: Lowella Grip III M.D.   On: 11/05/2015 09:42   Dg Foot Complete Left  Result Date: 10/28/2015 CLINICAL DATA:  Left big toe pain. EXAM: LEFT FOOT - COMPLETE 3+ VIEW COMPARISON:  None. FINDINGS: There is no evidence of fracture or dislocation. No evidence of dystrophic bony changes. There is soft tissue swelling of the distal first digit. Punctate lucencies within the area of soft tissue swelling may represent entrapped fat or potentially foci of gas. Heavy vascular calcifications are seen. IMPRESSION: No evidence of fracture of the left foot. Soft tissue swelling of the distal first digit with punctate lucencies within the area of swelling, which may represent entrapped fat or potentially foci of gas. Please correlate clinically regarding the possibility of cellulitis. Electronically Signed   By: Fidela Salisbury M.D.   On: 10/28/2015 21:01   Echocardiogram: 10/31/2015 Study Conclusions  - Left ventricle: The cavity size was normal. There was moderate   concentric hypertrophy. Systolic function was vigorous. The   estimated ejection fraction was in the range of 65% to 70%. Wall   motion was normal;  there were no regional wall motion   abnormalities. Features are consistent with a pseudonormal left   ventricular filling pattern, with concomitant abnormal relaxation   and increased filling pressure (grade 2 diastolic dysfunction). - Aortic valve: Trileaflet; mildly thickened, mildly calcified   leaflets. - Mitral valve: Moderately calcified annulus. - Left atrium: The atrium was mildly dilated. Volume/bsa, ES,   (1-plane Simpson&'s, A2C): 36.1 ml/m^2. - Right atrium: The atrium was moderately dilated. - Pulmonary arteries: Systolic pressure was moderately increased.   PA peak pressure: 53 mm Hg (S).  Assessment & Plan    1. Chronic Atrial Fibrillation - This patients CHA2DS2-VASc Score and unadjusted Ischemic Stroke Rate (% per year) is equal to 9.7 % stroke rate/year from a score of 6 (CHF, HTN, DM, Vascular, Age (2)). Heparin stopped, transitioning back to Coumadin. INR supratherapeutic this AM at 3.65. Pharmacy following.  - continue BB for rate-control. He is having pauses up to 2.09 seconds. Digoxin stopped this admission. May need reduction in BB dosing. HR currently well-controlled.   2. Chronic Diastolic CHF - echo this admission showing preserved EF of 65-70% with Grade 2 DD.  - overall, he is +11.1L this admission. Appears significantly volume overloaded on physical exam. Diuretic dosing per Nephrology with rising creatinine.   3. HTN - BP has been 108/52 - 130/90 in the past 24 hours. - continue current medication regimen.   4. Stage 4 CKD - s/p renal transplant in 2005, baseline creatinine 1.8 - 2.0. Creatinine 3.28 on 11/12/2015. - Nephrology following.   5. Gangrene of L great toe - s/p amputation on 8/2 - per Vascular Surgery.   Signed, Erma Heritage , PA-C 7:06 AM 11/12/2015 Pager: [phone]  I have examined the patient and reviewed assessment and plan and discussed with patient.  Agree with above as stated.  AFib, rate controlled.  Watch for longer  pauses than the 2.1 sec pauses he is having now.  Coumadin for stroke prevention.  Volume overloaded with bilateral arm and leg swelling.   Larae Grooms

## 2015-11-12 NOTE — Progress Notes (Signed)
ANTICOAGULATION CONSULT NOTE - Follow Up Consult  Pharmacy Consult for Heparin/Coumadin Indication: afib   Allergies  Allergen Reactions  . Tape Other (See Comments)    SKIN WILL TEAR!!    Patient Measurements: Height: 5\' 9"  (175.3 cm) Weight: 242 lb 8.1 oz (110 kg) IBW/kg (Calculated) : 70.7  Vital Signs: Temp: 97.7 F (36.5 C) (08/08 0500) Temp Source: Oral (08/08 0500) BP: 119/52 (08/08 0500) Pulse Rate: 62 (08/08 0500)  Labs:  Recent Labs  11/10/15 0313 11/11/15 0314 11/12/15 0350  HGB 10.8* 10.6*  --   HCT 33.3* 32.9*  --   PLT 294 284  --   LABPROT 32.6* 38.6* 37.2*  INR 3.10 3.83 3.65  HEPARINUNFRC 0.57 <0.10*  --   CREATININE 2.98* 3.06* 3.28*    Estimated Creatinine Clearance: 23.8 mL/min (by C-G formula based on SCr of 3.28 mg/dL).   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 Heparin started 8/3, PTA warfarin restarted 8/4. INR currently supratherapeutic at 3.65 (down from 8/7).  **Warfarin PTA dosing: 5mg  daily except 2.5mg  on Tuesdays and Thursdays.    Goal of Therapy:  INR 2-3 Monitor platelets by anticoagulation protocol: Yes    Plan:  -Hold warfarin tonight -Daily INR  Hildred Laser, Pharm D 11/12/2015 7:48 AM

## 2015-11-12 NOTE — Progress Notes (Signed)
Patient ID: Mark [name], male   DOB: [birth year], 75 y.o.   MRN: [id number]                                                                PROGRESS NOTE                                                                                                                                                                                                             Patient Demographics:    Mark [name], is a 75 y.o. male, DOB - December 21, 1940, [id number]  Admit date - 10/28/2015   Admitting Physician Etta Quill, DO  Outpatient Primary MD for the patient is Rochel Brome, MD  LOS - 15  Outpatient Specialists:   Chief Complaint  Patient presents with  . 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 Patient presents to ED with c/o 'his toe is dead'. Creatinine baseline appears to be 1.8-2.3.  Primary nephrologist is Dr. Justin Mend.  Saw him on 06/26/2015 with BUN 27, creatinine 1.88.  Admitted on 10/28/2015 with necrotic left great toe with cellulitis.  Creatinine on admission was 2.6.  Seen by [name] in consult on 7/25.  His Prograf level was found to be low and was increased.  Patient underwent abdominal aortogram with angioplasty of the left anterior tibial artery and left popliteal artery on 11/05/2015.  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  He was on Vancomycin and Zosyn from 7/24-8/3.  Currently only on Zosyn.  He developed some dyspnea in the setting of being positive about 13 liters since admission with evidence of volume overload on exam   Subjective:    Mark [name]  S/p amputation, very  SOB , no cp    Assessment  & Plan :    Principal Problem:   Gangrene of toe ([place]) Active Problems:   Type II diabetes mellitus, uncontrolled ([place])   Renal transplant recipient   Diabetic ulcer of left great toe ([place])   Cellulitis of great toe of  left foot   Aspiration of liquid   SOB (shortness of breath)   Permanent atrial fibrillation (HCC)   Bibasilar  crackles   CHF (congestive heart failure) (HCC)   Shortness of breath, Suspect acute on chronic diastolic heart failure, No CP.SOB with minimal exertion such as moving around in the bed. Likely secondary to pulmonary vascular congestion , cardiology and nephrology managing diuretics Chest x-ray shows mild congestive changes,      Renal allograft function just a little worse today with Creatinine 3.28 from 2.98. Hopefully we are beginning to see a peak in his rise  2-D echo did not show any wall motion abnormalities and the patient does not appear to have any ischemic changes on his EKG Renal allograft function worse today with Creatinine 3.28 from 3.06. Refractory volume overload with slowly rising creat.  WIll increase diureics and see if any improvement.  Dialysis may be needed   Gangrene of left  Toe/Toe Amputation:  S /P atherectomy and DCB Left popliteal artery and PTA left ATA (POD 5) S/P Ray amputation of left great toe (POD 4) Palpable Left popliteal pulse Toe amp site looks ok. Continue dressing changes   VVS following .ABI shows no blood flow to the left toe,  Situation complicated by renal transplant with elevated creatinine, Coumadin resumed, patient currently on a heparin drip as INR 1.9 Received clearance from cardiology according to [facility] and AHA guidelines   no further cardiac workup was done prior to his noncardiac surgery  Wound culture shows gram-positive cocci in clusters, however blood culture has been negative so far, if margins are clear doubt  patient needs to continue with antibiotics at this time especially in the setting of diarrhea Vanco random  level 8/5 of 19, and no dose of vancomycin since 8/2 PM.  Per nephro,   d/c vancomycin order completely 8.5    Diarrhea-C. difficile negative 2 during this admission, improved, continue  probiotic   Acute on Chronic kidney disease stage IV, status post renal transplant , baseline creatinine around 1.7, now 3. Patient is status post kidney transplant in 2005 at [facility] Nephrology consulted to monitor patient's renal function in the setting of   procedure/angiogram.Continue anti-rejection meds, repeat Tacrolimus level is 7.3. - increase Lasix to 160mg  Q6H  Timing of increased creatinine coincides with aortogram on August 1.  , currently creatinine is trending up, nephrology following,may have to consider dialysis later this week   A fib with with aberrancy - resumed coumadin, heparin per pharmacy, rate controlled , preop 2-D echo shows grade 2 diastolic dysfunction, pulmonary hypertension, EKG/telemetry shows A. fib rate controlled, continue metoprolol, Cardiology consulted for preop clearance ,  He is having some aberrancy on telemetry. Continue beta blocker for rate control. Digoxin DCed. Continue heparin; resume coumadin per  vascular surgery   DM2 - continue Lantus at 20 units since creatinine is trending up, will continue with the same dose of Lantus, contpre-meal NovoLog 5 units.Moderate dose mealtime and SSI AC/HS,     Hemoglobin A1c 8.2  Hypertension: uncontrolled Continue norvasc to 10mg  po qday , metoprolol Prn hydralazine  Hypothyroidism-continue Synthroid  Protein Calorie Malnutrition prostat  Dyslipidemia-continue Crestor  Pulmonary hypertension:  Mr. [place] has moderate pulmonary hypertension.   cardiology Recommend outpatient sleep study and PFTs.  This may also be due to left-sided heart disease. He does have grade 2 diastolic dysfunction. Recommend diuresis as per nephrology.  There is no evidence of liver disease by laboratory testing   Code Status : FULL CODE  Family Communication  :   Disposition Plan  :  ? Rehab vs home  Barriers For Discharge :  renal function  Consults  :  Vascular surgery, nephrology, cardiology  Procedures  :  Atherectomy and drug coated balloon angioplasty 8/1 , toe amputation 8/2  DVT Prophylaxis  :  Heparin     Lab Results  Component Value Date   PLT 284 11/11/2015    Antibiotics  :     10/28/15 2200  piperacillin-tazobactam (ZOSYN) IVPB 3.375 g  Status:  Discontinued     3.375 g 12.5 mL/hr over 240 Minutes Intravenous Every 8 hours 10/28/15 2120 11/09/15 1057   10/28/15 2200  vancomycin (VANCOCIN) 2,500 mg in sodium chloride 0.9 % 500 mL IVPB     2,500 mg 250 mL/hr over 120 Minutes Intravenous  Once 10/28/15 2154 10/29/15 2330        Objective:   Vitals:   11/11/15 0448 11/11/15 1201 11/11/15 2144 11/12/15 0500  BP: (!) 144/80 130/90 (!) 108/56 (!) 119/52  Pulse: 73 70 68 62  Resp: 18  20 18   Temp: 97.5 F (36.4 C) 97.4 F (36.3 C) 97.8 F (36.6 C) 97.7 F (36.5 C)  TempSrc: Oral Oral Oral Oral  SpO2: 97% 100% 96% 99%  Weight:      Height:        Wt Readings from Last 3 Encounters:  11/06/15 110 kg (242 lb 8.1 oz)  09/05/15 103.1 kg (227 lb 3.2 oz)  08/05/15 101.8 kg (224 lb 6.4 oz)     Intake/Output Summary (Last 24 hours) at 11/12/15 1346 Last data filed at 11/12/15 0958  Gross per 24 hour  Intake              200 ml  Output              825 ml  Net             -625 ml     Physical Exam somnolent, No new F.N deficits, Normal affect [place].AT,PERRAL Supple Neck,No JVD, No cervical lymphadenopathy appriciated.  Symmetrical Chest wall movement, Good air movement bilaterally, CTAB RRR,No Gallops,Rubs or new Murmurs, No Parasternal Heave +ve B.Sounds, Abd Soft, No tenderness, No organomegaly appriciated, No rebound - guarding or rigidity. No edema.  1st left foot black.    Data Review:    CBC  Recent Labs Lab 11/07/15 0055 11/08/15 1055 11/09/15 0256 11/10/15 0313 11/11/15 0314  WBC 9.8 8.9 8.8 9.2 8.6  HGB 12.4* 12.9* 11.6* 10.8* 10.6*  HCT 38.4* 39.4 35.9* 33.3* 32.9*  PLT 281 298 282 294 284  MCV 94.6 94.5 94.5 94.1 94.3  MCH 30.5 30.9 30.5  30.5 30.4  MCHC 32.3 32.7 32.3 32.4 32.2  RDW 15.5 15.4 15.7* 15.7* 16.3*    Chemistries   Recent Labs Lab 11/07/15 0317 11/08/15 1055 11/09/15 0256 11/10/15 0313 11/11/15 0314 11/12/15 0350  NA 139 139 137 138 138 135  K 4.3 4.2 4.5 4.8 4.6 5.6*  CL 111 109 109 110 109 107  CO2 20* 21* 20* 21* 21* 18*  GLUCOSE 141* 130* 147* 225* 163* 152*  BUN 30* 37* 45* 68* 72* 87*  CREATININE 2.44* 2.52* 2.78* 2.98* 3.06* 3.28*  CALCIUM 8.5* 8.7* 8.6* 8.6* 8.8* 8.8*  AST 25 21 18 16 17   --   ALT 17 16* 15* 15* 16*  --   ALKPHOS 87 93 98 93 101  --   BILITOT 0.9 0.9 1.1 0.6 0.8  --    ------------------------------------------------------------------------------------------------------------------ No results for input(s): CHOL, HDL, LDLCALC, TRIG, CHOLHDL, LDLDIRECT in the last 72 hours.  Lab Results  Component Value Date   HGBA1C 8.2 (H) 10/29/2015   ------------------------------------------------------------------------------------------------------------------ No results for input(s): TSH, T4TOTAL, T3FREE, THYROIDAB in the last 72 hours.  Invalid input(s): FREET3 ------------------------------------------------------------------------------------------------------------------ No results for input(s): VITAMINB12, FOLATE, FERRITIN, TIBC, IRON, RETICCTPCT in the last 72 hours.  Coagulation profile  Recent Labs Lab 11/08/15 1055 11/09/15 0256 11/10/15 0313 11/11/15 0314 11/12/15 0350  INR 1.69 1.90 3.10 3.83 3.65    No results for input(s): DDIMER in the last 72 hours.  Cardiac Enzymes  Recent Labs Lab 11/06/15 1540 11/06/15 1940  TROPONINI 0.06* 0.07*   ------------------------------------------------------------------------------------------------------------------ No results found for: BNP  Inpatient Medications  Scheduled Meds: . allopurinol  150 mg Oral Daily  . amLODipine  10 mg Oral Daily  . aspirin  81 mg Oral Daily  . calcitRIOL  0.5 mcg Oral Daily   . docusate sodium  100 mg Oral Daily  . famotidine  20 mg Oral QHS  . feeding supplement  1 Container Oral TID WC  . furosemide  160 mg Intravenous Q6H  . hydrALAZINE  25 mg Oral Q8H  . insulin aspart  0-15 Units Subcutaneous TID WC  . insulin aspart  5 Units Subcutaneous TID WC  . insulin glargine  20 Units Subcutaneous QHS  . levothyroxine  137 mcg Oral QAC breakfast  . magnesium oxide  400 mg Oral BID  . metolazone  5 mg Oral Daily  . metoprolol succinate  100 mg Oral Daily  . predniSONE  30 mg Oral Daily  . rosuvastatin  10 mg Oral QPM  . saccharomyces boulardii  250 mg Oral BID  . tacrolimus  1 mg Oral BID  . Warfarin - Pharmacist Dosing Inpatient   Does not apply q1800   Continuous Infusions:   PRN Meds:.alum & mag hydroxide-simeth, guaiFENesin-dextromethorphan, hydrALAZINE, HYDROcodone-acetaminophen, labetalol, levalbuterol, metoprolol, ondansetron (ZOFRAN) IV, phenol  Micro Results Recent Results (from the past 240 hour(s))  C difficile quick scan w PCR reflex     Status: None   Collection Time: 11/04/15  9:42 AM  Result Value Ref Range Status   C Diff antigen NEGATIVE NEGATIVE Final   C Diff toxin NEGATIVE NEGATIVE Final   C Diff interpretation No C. difficile detected.  Final  Surgical PCR screen     Status: Abnormal   Collection Time: 11/05/15 11:18 PM  Result Value Ref Range Status   MRSA, PCR NEGATIVE NEGATIVE Final   Staphylococcus aureus POSITIVE (A) NEGATIVE Final    Comment:        The Xpert SA Assay (FDA approved for NASAL specimens in patients over 37 years of age), is one component of a comprehensive surveillance program.  Test performance has been validated by [facility] for patients greater than or equal to 75 year old. It is not intended to diagnose infection nor to guide or monitor treatment.   Fungus Culture With Stain (Not @ [facility])     Status: None (Preliminary result)   Collection Time: 11/06/15  2:33 PM  Result Value Ref Range Status    Fungus Stain Final report  Final    Comment: (NOTE) Performed At: [facility] [place], [place] [id number] Lindon Romp MD [id number]    Fungus (Mycology) Culture PENDING  Incomplete   Fungal Source WOUND  Final    Comment: LEFT TOE POF VANC AND ZOSYN   Acid Fast Smear (AFB)     Status: None   Collection Time: 11/06/15  2:33 PM  Result Value  Ref Range Status   AFB Specimen Processing Concentration  Final   Acid Fast Smear Negative  Final    Comment: (NOTE) Performed At: [facility] [place], [place] [id number] Lindon Romp MD [id number]    Source (AFB) WOUND  Final    Comment: LEFT TOE POF VANC AND ZOSYN   Aerobic Culture (superficial specimen)     Status: None   Collection Time: 11/06/15  2:33 PM  Result Value Ref Range Status   Specimen Description WOUND LEFT TOE  Final   Special Requests GREAT TOE POF VANC AND ZOSYN  Final   Gram Stain   Final    NO WBC SEEN FEW SQUAMOUS EPITHELIAL CELLS PRESENT FEW GRAM POSITIVE COCCI IN CLUSTERS    Culture MULTIPLE ORGANISMS PRESENT, NONE PREDOMINANT  Final   Report Status 11/08/2015 FINAL  Final  Fungus Culture Result     Status: None   Collection Time: 11/06/15  2:33 PM  Result Value Ref Range Status   Result 1 Comment  Final    Comment: (NOTE) KOH/Calcofluor preparation:  no fungus observed. Performed At: [facility] [place], [place] [id number] Lindon Romp MD [id number]   C difficile quick scan w PCR reflex     Status: None   Collection Time: 11/09/15  4:08 PM  Result Value Ref Range Status   C Diff antigen NEGATIVE NEGATIVE Final   C Diff toxin NEGATIVE NEGATIVE Final   C Diff interpretation No C. difficile detected.  Final    Radiology Reports Dg Chest Port 1 View  Result Date: 11/09/2015 CLINICAL DATA:  Shortness of breath EXAM: PORTABLE CHEST 1 VIEW COMPARISON:  November 06, 2015 FINDINGS: Mild cardiomegaly. The hila and  mediastinum are unchanged. Diffuse bilateral interstitial opacities again identified and similar in the interval. The opacity is a little more focal in the left suprahilar region which is increased since November 05, 2015 but unchanged since November 06, 2015. No other acute interval changes. There may be tiny bilateral effusions with blunting of the costophrenic angles. IMPRESSION: 1. Suspected tiny bilateral pleural effusions and diffuse increased interstitial markings are most consistent with pulmonary edema. Atypical viral infection could have a similar appearance. Recommend clinical correlation. *Opacity in the left suprahilar region is a little more focal when compared November 05, 2015. The difference could be due to confluence of shadows. Recommend follow-up to resolution. Electronically Signed   By: Dorise Bullion III M.D   On: 11/09/2015 09:14   Dg Chest Port 1 View  Result Date: 11/06/2015 CLINICAL DATA:  75 year old male with increasing shortness of breath and abnormal left side pulmonary auscultation. Initial encounter. EXAM: PORTABLE CHEST 1 VIEW COMPARISON:  0508 hours today and earlier. FINDINGS: Portable AP semi upright view at 2127 hours. Continued and mildly increased perihilar and basilar predominant interstitial pulmonary opacity. Stable cardiac size and mediastinal contours. No pneumothorax. No pleural effusion or consolidation identified. IMPRESSION: Increased bilateral pulmonary interstitial opacity. Top differential considerations include progression of interstitial edema versus viral/ atypical respiratory infection. No pleural effusion identified. Electronically Signed   By: Genevie Ann M.D.   On: 11/06/2015 21:42   Dg Chest Port 1 View  Result Date: 11/06/2015 CLINICAL DATA:  75 year old male with aspiration of liquid. EXAM: PORTABLE CHEST 1 VIEW COMPARISON:  Chest radiograph dated 11/05/2015 FINDINGS: Mild cardiomegaly with central vascular congestion. Bibasilar Interstitial prominence and  nodular densities, new from prior study may represent atelectatic changes versus infiltrate. There is no pleural effusion  or pneumothorax. Top-normal cardiac silhouette. No acute osseous pathology. IMPRESSION: Mild congestive changes with bibasilar subsegmental atelectasis versus infiltrate. Electronically Signed   By: Anner Crete M.D.   On: 11/06/2015 05:16   Dg Chest Port 1 View  Result Date: 11/05/2015 CLINICAL DATA:  Shortness of breath EXAM: PORTABLE CHEST 1 VIEW COMPARISON:  March 14, 2015 FINDINGS: The lungs are clear. Heart is mildly enlarged with pulmonary vascularity within normal limits. No adenopathy. No bone lesions. IMPRESSION: Mild cardiac enlargement.  No edema or consolidation. Electronically Signed   By: Lowella Grip III M.D.   On: 11/05/2015 09:42   Dg Foot Complete Left  Result Date: 10/28/2015 CLINICAL DATA:  Left big toe pain. EXAM: LEFT FOOT - COMPLETE 3+ VIEW COMPARISON:  None. FINDINGS: There is no evidence of fracture or dislocation. No evidence of dystrophic bony changes. There is soft tissue swelling of the distal first digit. Punctate lucencies within the area of soft tissue swelling may represent entrapped fat or potentially foci of gas. Heavy vascular calcifications are seen. IMPRESSION: No evidence of fracture of the left foot. Soft tissue swelling of the distal first digit with punctate lucencies within the area of swelling, which may represent entrapped fat or potentially foci of gas. Please correlate clinically regarding the possibility of cellulitis. Electronically Signed   By: Fidela Salisbury M.D.   On: 10/28/2015 21:01   Time Spent in minutes  34   Lateya Dauria M.D on 11/12/2015 at 1:46 PM  Between 7am to 7pm - Pager - [phone]  After 7pm go to [email or web address] - password [facility]  [facility] -  Office  [phone]

## 2015-11-12 NOTE — Consult Note (Signed)
   [facility] CM Inpatient Consult   11/12/2015  Mark [name] September 30, 1940 [id number]     Patient screened for potential [facility] Care Management services. Chart reviewed. Noted discharge plan is for  SNF.  There are no identifiable [facility] Care Management needs at this time. Spoke with inpatient RNCM to confirm. If patient's post hospital needs change, please place a [facility] Care Management consult. For questions please contact:  Marthenia Rolling, [place], RN,BSN [facility] Liaison [phone]

## 2015-11-12 NOTE — Progress Notes (Signed)
NP Schorr notified of patient's potassium of 5.6.  New orders received.

## 2015-11-12 NOTE — Progress Notes (Signed)
Speech Language Pathology Treatment: Dysphagia  Patient Details Name: Mark [name] MRN: [id number] DOB: [birth year] Today's Date: 11/12/2015 Time: 1210-1220 SLP Time Calculation (min) (ACUTE ONLY): 10 min  Assessment / Plan / Recommendation Clinical Impression  Dysphagia treatment provided to further assess swallow function/ tolerance of solids. Pt finishing lunch at time of treatment, only took 2 bites of solid then declined any more. Pt did have an immediate cough x1 following initial sip of thin liquid; cued pt to take small sips and no further s/s of aspiration noted. Spoke with RN who did not have concerns for pt with meals; however, did recall one instance of pt with sensation of pill getting stuck and was able to "get it down" with another sip of liquid. Pt may benefit from meds whole in puree if this difficulty persists. Recommend continuing regular diet/ thin liquids, intermittent supervision to cue pt to take small bites/ sips (still at increased risk of aspiration given respiratory status), have pt seated upright 30-60 minutes after meal. Reviewed recommendations/ strategies with pt who is in agreement. Will continue to follow to ensure tolerance as pt only having a few PO trials during this session.   HPI HPI: Pt is a 75 y.o. 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 Presented to ED with gangrenous L great toe. Pt underwent abdominal aortogram with angioplasty of the left anterior tibial artery/left popliteal artery on 8/1. Pt was SOB on 8/2; also reportedly had episode of coughing/ gurgling after sipping liquids on 8/2. CXR showed increased bilateral pulmonary interstitial opacity. Had toe amputation 8/2 as well. Bedside swallow eval ordered to further assess aspiration risk.       SLP Plan  Continue with current plan of care     Recommendations  Diet recommendations: Regular;Thin liquid Liquids provided via: Straw;Cup Medication  Administration: Whole meds with liquid Supervision: Patient able to self feed;Intermittent supervision to cue for compensatory strategies Compensations: Slow rate;Small sips/bites Postural Changes and/or Swallow Maneuvers: Seated upright 90 degrees;Upright 30-60 min after meal             Oral Care Recommendations: Oral care BID Follow up Recommendations:  (TBD) Plan: Continue with current plan of care     [place], Othmar Ringer K, [place], CCC-SLP 11/12/2015, 12:49 PM [phone]

## 2015-11-12 NOTE — Progress Notes (Signed)
OT Cancellation Note  Patient Details Name: Mark [name] MRN: [id number] DOB: 1941/02/24   Cancelled Treatment:    Reason Eval/Treat Not Completed: Other (comment).  Pt states he is too fatiqued even to sit at EOB at this time.   Will check back tomorrow as schedule permits.    Lyfe Monger 11/12/2015, 10:53 AM  Lesle Chris, OTR/L [phone] 11/12/2015

## 2015-11-12 NOTE — Progress Notes (Signed)
[facility] Rounding Note  Subjective: Patient seen and examined this morning. Feels about the same.  Legs and arms still swollen. No CP. SOB with exertion such as moving around in the bed.  Objective: BP (!) 119/52 (BP Location: Left Arm)   Pulse 62   Temp 97.7 F (36.5 C) (Oral)   Resp 18   Ht 5\' 9"  (1.753 m)   Wt 242 lb 8.1 oz (110 kg)   SpO2 99%   BMI 35.81 kg/m    Intake/Output Summary (Last 24 hours) at 11/12/15 0747 Last data filed at 11/12/15 0500  Gross per 24 hour  Intake                0 ml  Output              825 ml  Net             -825 ml    Weight change:   EXAM: General: lying in bed, on supplemental oxygen, no acute distress HEENT: [place]/AT, EOMI, no scleral icterus Cardiac: irregular rhythm, regular rate, no m/r/g Pulm: diminished breath sounds, normal effort, no wheezes or crackles Abd: soft, mild tenderness at area of kidney on Left, protuberant Ext: warm and well perfused, 2+ lower extremity edema bilaterally plus edematous upper extremities.  Left foot wrapped in clean, dry Ace bandage. GU: condom cath Neuro: alert and oriented X3, cranial nerves II-XII grossly intact   Labs:  Recent Labs Lab 11/05/15 0930  11/10/15 0313 11/11/15 0314 11/12/15 0350  NA  --   < > 138 138 135  K  --   < > 4.8 4.6 5.6*  CL  --   < > 110 109 107  CO2  --   < > 21* 21* 18*  GLUCOSE  --   < > 225* 163* 152*  BUN  --   < > 68* 72* 87*  CREATININE  --   < > 2.98* 3.06* 3.28*  CALCIUM  --   < > 8.6* 8.8* 8.8*  MG 2.4  --   --   --   --   < > = values in this interval not displayed.    Recent Labs Lab 11/09/15 0256 11/10/15 0313 11/11/15 0314  AST 18 16 17   ALT 15* 15* 16*  ALKPHOS 98 93 101  BILITOT 1.1 0.6 0.8  PROT 5.6* 5.5* 5.3*  ALBUMIN 2.0* 2.0* 2.1*     Recent Labs Lab 11/07/15 0055 11/08/15 1055 11/09/15 0256 11/10/15 0313 11/11/15 0314  WBC 9.8 8.9 8.8 9.2 8.6  HGB 12.4* 12.9* 11.6* 10.8* 10.6*  HCT 38.4* 39.4 35.9* 33.3* 32.9*  MCV  94.6 94.5 94.5 94.1 94.3  PLT 281 298 282 294 284     Recent Labs Lab 11/05/15 0856 11/06/15 1540 11/06/15 1940  TROPONINI 0.09* 0.06* 0.07*      Recent Labs Lab 11/11/15 0650 11/11/15 1141 11/11/15 1711 11/11/15 2145 11/12/15 0615  GLUCAP 159* 191* 134* 172* 160*    ABG    Component Value Date/Time   PHART 7.408 11/07/2015 0235   PCO2ART 30.9 (L) 11/07/2015 0235   PO2ART 71.1 (L) 11/07/2015 0235   HCO3 19.1 (L) 11/07/2015 0235   TCO2 20.1 11/07/2015 0235   ACIDBASEDEF 4.7 (H) 11/07/2015 0235   O2SAT 94.4 11/07/2015 0235   TAC trough level from 8/5 pending  Studies/Results: No results found. Scheduled Meds: . allopurinol  150 mg Oral Daily  . amLODipine  10 mg Oral Daily  .  aspirin  81 mg Oral Daily  . calcitRIOL  0.5 mcg Oral Daily  . docusate sodium  100 mg Oral Daily  . famotidine  20 mg Oral QHS  . feeding supplement  1 Container Oral TID WC  . furosemide  160 mg Intravenous Q8H  . hydrALAZINE  25 mg Oral Q8H  . insulin aspart  0-15 Units Subcutaneous TID WC  . insulin aspart  5 Units Subcutaneous TID WC  . insulin glargine  20 Units Subcutaneous QHS  . levothyroxine  137 mcg Oral QAC breakfast  . magnesium oxide  400 mg Oral BID  . metolazone  5 mg Oral Daily  . metoprolol succinate  100 mg Oral Daily  . phosphorus  250 mg Oral BID  . predniSONE  30 mg Oral Daily  . rosuvastatin  10 mg Oral QPM  . saccharomyces boulardii  250 mg Oral BID  . tacrolimus  1 mg Oral BID  . Warfarin - Pharmacist Dosing Inpatient   Does not apply q1800   PRN Meds alum & mag hydroxide-simeth, guaiFENesin-dextromethorphan, hydrALAZINE, HYDROcodone-acetaminophen, labetalol, levalbuterol, metoprolol, ondansetron (ZOFRAN) IV, phenol  Infusion Meds   Background: 75 y.o. male.  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  Creatine baseline appears to be 1.8-2.0.   Primary nephrologist is Dr. Justin Mend. Saw him on 06/26/2015 with BUN 27, creatinine 1.88.  Admitted on 10/28/2015 with necrotic left great toe with cellulitis. Creatinine on admission was 2.6.  Seen by [name] in consult on 7/25.  Creatinine had been improving until last couple days when started to trend back up post aortogram and in setting of being up 13 liters since admission.  Assessment/Plan:  Renal transplant (2005)  with AKI on CKD4 post contrast administration - Baseline creatinine 1.88-2 Timing of increased creatinine coincides with aortogram on August 1, however, atheroembolic event remains possible as well given his stuttering creatinine increases.  It appears he received 60cc of contrast during procedure which is a moderate contrast load given his GFR Renal allograft function worse today with Creatinine 3.28 from 3.06.  Diuresis has not been striking thus far.  825cc out yesterday recorded. Dyspnea better, still volume overloaded. UOP recorded yesterday as 825cc.  Lasix dose at 160mg  IV TID. Metolazone dose at 5mg  daily. Potassium this morning 5.6. Steroids increased yesterday for any component of acute rejection. - repeat Tacrolimus level is 7.3. - increase Lasix to 160mg  Q6H - continue metolazone at current 5mg  daily dose. - may have to consider dialysis later this week. - may be seeing a decline in function of his transplant kidney - Kayexalate given by primary service - check UA given his tenderness on exam.  CKD-MBD  On calcitriol 0.6mcg daily, phosphorous 250mg  BID PTH on 7/25 was 82  HTN/Volume:   BP has been 108/52-130/90 past 24 hours.   Still volume overloaded.   Hydralazine TID in addition to amlodipine and Lasix.   Cardiology following.   Continuing current medications  DM Per primary service  AFib:  BB for rate control, on heparin, coumadin per VVS  Gangrene/Cellulitis/Toe Amputation:   S/p atherectomy and DCB L popliteal art, and PTA left  anterior tibial artery 11/05/2015.   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   Off abx VVS following.  HLD  Hypothyroidism   Jule Ser  7:47 AM   Renal Attending: Refractory volume overload with slowly rising creat.  WIll increase diureics and see if any improvement.  Dialysis may be needed.  On higher dose of steroids for any possible rejection.  D/w Dr Justin Mend. Shanese Riemenschneider C

## 2015-11-13 DIAGNOSIS — K921 Melena: Secondary | ICD-10-CM

## 2015-11-13 LAB — BASIC METABOLIC PANEL
ANION GAP: 13 (ref 5–15)
BUN: 100 mg/dL — ABNORMAL HIGH (ref 6–20)
CALCIUM: 8.8 mg/dL — AB (ref 8.9–10.3)
CO2: 19 mmol/L — ABNORMAL LOW (ref 22–32)
CREATININE: 3.63 mg/dL — AB (ref 0.61–1.24)
Chloride: 104 mmol/L (ref 101–111)
GFR, EST AFRICAN AMERICAN: 17 mL/min — AB (ref >60)
GFR, EST NON AFRICAN AMERICAN: 15 mL/min — AB (ref >60)
Glucose, Bld: 253 mg/dL — ABNORMAL HIGH (ref 65–99)
Potassium: 5 mmol/L (ref 3.5–5.1)
Sodium: 136 mmol/L (ref 135–145)

## 2015-11-13 LAB — CBC
HEMATOCRIT: 30.9 % — AB (ref 39.0–52.0)
Hemoglobin: 10.2 g/dL — ABNORMAL LOW (ref 13.0–17.0)
MCH: 31.7 pg (ref 26.0–34.0)
MCHC: 33 g/dL (ref 30.0–36.0)
MCV: 96 fL (ref 78.0–100.0)
Platelets: 279 10*3/uL (ref 150–400)
RBC: 3.22 MIL/uL — ABNORMAL LOW (ref 4.22–5.81)
RDW: 16.4 % — AB (ref 11.5–15.5)
WBC: 7.7 10*3/uL (ref 4.0–10.5)

## 2015-11-13 LAB — GLUCOSE, CAPILLARY
GLUCOSE-CAPILLARY: 285 mg/dL — AB (ref 65–99)
GLUCOSE-CAPILLARY: 275 mg/dL — AB (ref 65–99)
Glucose-Capillary: 280 mg/dL — ABNORMAL HIGH (ref 65–99)
Glucose-Capillary: 285 mg/dL — ABNORMAL HIGH (ref 65–99)

## 2015-11-13 LAB — PROTIME-INR
INR: 3.37
PROTHROMBIN TIME: 34.8 s — AB (ref 11.4–15.2)

## 2015-11-13 LAB — TYPE AND SCREEN
ABO/RH(D): A POS
ANTIBODY SCREEN: NEGATIVE

## 2015-11-13 LAB — ABO/RH: ABO/RH(D): A POS

## 2015-11-13 MED ORDER — PANTOPRAZOLE SODIUM 40 MG PO TBEC: 40.0000 mg | DELAYED_RELEASE_TABLET | Freq: Every day | ORAL | Status: DC

## 2015-11-13 MED ORDER — PANTOPRAZOLE SODIUM 40 MG PO TBEC
40.0000 mg | DELAYED_RELEASE_TABLET | Freq: Two times a day (BID) | ORAL | Status: DC
  Administered 2015-11-13 – 2015-11-26 (×24): 40 mg via ORAL
  Filled 2015-11-13 (×25): qty 1

## 2015-11-13 MED ORDER — METOLAZONE 5 MG PO TABS
10.0000 mg | ORAL_TABLET | Freq: Every day | ORAL | Status: DC
  Administered 2015-11-13 – 2015-11-14 (×2): 10 mg via ORAL
  Filled 2015-11-13: qty 2

## 2015-11-13 NOTE — Progress Notes (Signed)
Lab called RN stating they were going to cancel pts labs due to pt refusing. RN asked lab to attempt a re-draw with butterfly needle as pt states this was the reason for denial. Lab states they will try one more time, but if pt refuses again they are canceling labs.

## 2015-11-13 NOTE — Progress Notes (Addendum)
Patient ID: Mark [name], male   DOB: January 01, 1941, 75 y.o.   MRN: [id number]                                                                PROGRESS NOTE                                                                                                                                                                                                             Patient Demographics:    Mark [name], is a 75 y.o. male, DOB - 09-21-1940, [id number]  Admit date - 10/28/2015   Admitting Physician Etta Quill, DO  Outpatient Primary MD for the patient is Rochel Brome, MD  LOS - 16  Outpatient Specialists:   Chief Complaint  Patient presents with  . 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 Patient presents to ED with c/o 'his toe is dead'. Creatinine baseline appears to be 1.8-2.3.  Primary nephrologist is Dr. Justin Mend.  Saw him on 06/26/2015 with BUN 27, creatinine 1.88.  Admitted on 10/28/2015 with necrotic left great toe with cellulitis.  Creatinine on admission was 2.6.  Seen by [name] in consult on 7/25.  His Prograf level was found to be low and was increased.  Patient underwent abdominal aortogram with angioplasty of the left anterior tibial artery and left popliteal artery on 11/05/2015.  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  He was on Vancomycin and Zosyn from 7/24-8/3.  Currently only on Zosyn.  He developed some dyspnea in the setting of being positive about 13 liters since admission with evidence of volume overload on exam   Subjective:    Mark [name]  States he had some black stools, worsening anasarca, getting more depressed    Assessment  & Plan :    Principal Problem:   Gangrene of toe ([place]) Active Problems:   Type II diabetes mellitus, uncontrolled ([place])   Renal transplant recipient   Diabetic ulcer of left great  toe ([place])   Cellulitis of great toe of left foot   Aspiration of liquid   SOB (shortness of breath)   Permanent atrial fibrillation ([place])  Bibasilar crackles   CHF (congestive heart failure) (H[facility])   Shortness of breath, Suspect acute on chronic diastolic heart failure, No CP.SOB with minimal exertion such as moving around in the bed. Likely secondary to pulmonary vascular congestion , cardiology and nephrology managing diuretics Chest x-ray shows mild congestive changes,      Renal allograft function just a little worse today with Creatinine 3.28 from 2.98. Hopefully we are beginning to see a peak in his rise  2-D echo did not show any wall motion abnormalities and the patient does not appear to have any ischemic changes on his EKG Renal allograft function worse today with Creatinine 3.63   Refractory volume overload with slowly rising creat.  Dialysis tomorrow for volume mx, HD access per renal    Gangrene of left  Toe/Toe Amputation:  S /P atherectomy and DCB Left popliteal artery and PTA left ATA (POD 5) S/P Ray amputation of left great toe (POD 4) Palpable Left popliteal pulse Toe amp site looks ok. Continue dressing changes   VVS following .ABI shows no blood flow to the left toe,  Situation complicated by renal transplant with elevated creatinine, Coumadin resumed post op Received clearance from cardiology according to [facility] and AHA guidelines   no further cardiac workup was done prior to his noncardiac surgery  Wound culture shows gram-positive cocci in clusters, however blood culture has been negative so far, if margins are clear doubt  patient needs to continue with antibiotics at this time especially in the setting of diarrhea Vanco random  level 8/5 of 19, and no dose of vancomycin since 8/2 PM. Off all abx   Anemia of chronic ds, FOBT positive,INR >3.0, check stat cbc and follow .Hg has slowly trended down from 14.2>10.6  Diarrhea-C. difficile negative 2 during this  admission, improved, continue probiotic   Acute on Chronic kidney disease stage IV, status post renal transplant , baseline creatinine around 1.7, now 3. Patient is status post kidney transplant in 2005 at [facility] Nephrology consulted to monitor patient's renal function in the setting of   procedure/angiogram.Continue anti-rejection meds, repeat Tacrolimus level is 7.3. - increase Lasix to 160mg  Q6H  Timing of increased creatinine coincides with aortogram on August 1.  , currently creatinine is trending up, nephrology following,may have to consider dialysis later this week   A fib with with aberrancy - resumed coumadin, heparin per pharmacy, rate controlled , preop 2-D echo shows grade 2 diastolic dysfunction, pulmonary hypertension, EKG/telemetry shows A. fib rate controlled, continue metoprolol, Cardiology consulted for preop clearance ,  He is having some aberrancy on telemetry. Continue beta blocker for rate control. Digoxin DCed. Continue heparin; resumed coumadin per  vascular surgery   DM2 -increase  Lantus at 25  units CBG uncontrolled ,  , contpre-meal NovoLog 5 units.Moderate dose mealtime and SSI AC/HS,     Hemoglobin A1c 8.2  Hypertension: uncontrolled Continue norvasc to 10mg  po qday , metoprolol Prn hydralazine  Hypothyroidism-continue Synthroid  Protein Calorie Malnutrition Prostat, albumin 2.1, nutrition consult   Dyslipidemia-continue Crestor  Pulmonary hypertension:  Mr. 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   cardiology Recommend outpatient sleep study and PFTs.  This may also be due to left-sided heart disease. He does have grade 2 diastolic dysfunction. Recommend diuresis as per nephrology.  There is no evidence of liver disease by laboratory testing   Code Status : FULL CODE  Family Communication  :   Disposition Plan  :  ? Rehab vs home  Barriers For Discharge :  renal function  Consults  :  Vascular surgery, nephrology,  cardiology  Procedures  : Atherectomy and drug coated balloon angioplasty 8/1 , toe amputation 8/2  DVT Prophylaxis  :  Heparin     Lab Results  Component Value Date   PLT 284 11/11/2015    Antibiotics  :     10/28/15 2200  piperacillin-tazobactam (ZOSYN) IVPB 3.375 g  Status:  Discontinued     3.375 g 12.5 mL/hr over 240 Minutes Intravenous Every 8 hours 10/28/15 2120 11/09/15 1057   10/28/15 2200  vancomycin (VANCOCIN) 2,500 mg in sodium chloride 0.9 % 500 mL IVPB     2,500 mg 250 mL/hr over 120 Minutes Intravenous  Once 10/28/15 2154 10/29/15 2330        Objective:   Vitals:   11/12/15 2010 11/13/15 0500 11/13/15 1134 11/13/15 1300  BP: (!) 118/44 (!) 158/47 130/66 (!) 125/41  Pulse: 66 70 74 (!) 58  Resp: 20 18  19   Temp: 97.3 F (36.3 C) 98.7 F (37.1 C)  97.3 F (36.3 C)  TempSrc: Oral Oral  Oral  SpO2: 98% 96%  98%  Weight:      Height:        Wt Readings from Last 3 Encounters:  11/06/15 110 kg (242 lb 8.1 oz)  09/05/15 103.1 kg (227 lb 3.2 oz)  08/05/15 101.8 kg (224 lb 6.4 oz)     Intake/Output Summary (Last 24 hours) at 11/13/15 1347 Last data filed at 11/13/15 [id number]  Gross per 24 hour  Intake              120 ml  Output              575 ml  Net             -455 ml     Physical Exam somnolent, No new F.N deficits, Normal affect [name].AT,PERRAL Supple Neck,No JVD, No cervical lymphadenopathy appriciated.  Symmetrical Chest wall movement, Good air movement bilaterally, CTAB RRR,No Gallops,Rubs or new Murmurs, No Parasternal Heave +ve B.Sounds, Abd Soft, No tenderness, No organomegaly appriciated, No rebound - guarding or rigidity. No edema.  Diffuse anasarca, 3+ pitting edema throughout   Data Review:    CBC  Recent Labs Lab 11/07/15 0055 11/08/15 1055 11/09/15 0256 11/10/15 0313 11/11/15 0314  WBC 9.8 8.9 8.8 9.2 8.6  HGB 12.4* 12.9* 11.6* 10.8* 10.6*  HCT 38.4* 39.4 35.9* 33.3* 32.9*  PLT 281 298 282 294 284  MCV 94.6 94.5 94.5  94.1 94.3  MCH 30.5 30.9 30.5 30.5 30.4  MCHC 32.3 32.7 32.3 32.4 32.2  RDW 15.5 15.4 15.7* 15.7* 16.3*    Chemistries   Recent Labs Lab 11/07/15 0317 11/08/15 1055 11/09/15 0256 11/10/15 0313 11/11/15 0314 11/12/15 0350 11/13/15 0802  NA 139 139 137 138 138 135 136  K 4.3 4.2 4.5 4.8 4.6 5.6* 5.0  CL 111 109 109 110 109 107 104  CO2 20* 21* 20* 21* 21* 18* 19*  GLUCOSE 141* 130* 147* 225* 163* 152* 253*  BUN 30* 37* 45* 68* 72* 87* 100*  CREATININE 2.44* 2.52* 2.78* 2.98* 3.06* 3.28* 3.63*  CALCIUM 8.5* 8.7* 8.6* 8.6* 8.8* 8.8* 8.8*  AST 25 21 18 16 17   --   --   ALT 17 16* 15* 15* 16*  --   --   ALKPHOS 87 93 98 93 101  --   --   BILITOT 0.9 0.9 1.1 0.6 0.8  --   --    ------------------------------------------------------------------------------------------------------------------  No results for input(s): CHOL, HDL, LDLCALC, TRIG, CHOLHDL, LDLDIRECT in the last 72 hours.  Lab Results  Component Value Date   HGBA1C 8.2 (H) 10/29/2015   ------------------------------------------------------------------------------------------------------------------ No results for input(s): TSH, T4TOTAL, T3FREE, THYROIDAB in the last 72 hours.  Invalid input(s): FREET3 ------------------------------------------------------------------------------------------------------------------ No results for input(s): VITAMINB12, FOLATE, FERRITIN, TIBC, IRON, RETICCTPCT in the last 72 hours.  Coagulation profile  Recent Labs Lab 11/09/15 0256 11/10/15 0313 11/11/15 0314 11/12/15 0350 11/13/15 0802  INR 1.90 3.10 3.83 3.65 3.37    No results for input(s): DDIMER in the last 72 hours.  Cardiac Enzymes  Recent Labs Lab 11/06/15 1540 11/06/15 1940  TROPONINI 0.06* 0.07*   ------------------------------------------------------------------------------------------------------------------ No results found for: BNP  Inpatient Medications  Scheduled Meds: . allopurinol  150 mg Oral  Daily  . amLODipine  10 mg Oral Daily  . aspirin  81 mg Oral Daily  . calcitRIOL  0.5 mcg Oral Daily  . docusate sodium  100 mg Oral Daily  . famotidine  20 mg Oral QHS  . feeding supplement  1 Container Oral TID WC  . furosemide  160 mg Intravenous Q6H  . hydrALAZINE  25 mg Oral Q8H  . insulin aspart  0-15 Units Subcutaneous TID WC  . insulin aspart  5 Units Subcutaneous TID WC  . insulin glargine  20 Units Subcutaneous QHS  . levothyroxine  137 mcg Oral QAC breakfast  . magnesium oxide  400 mg Oral BID  . metolazone  10 mg Oral Daily  . metoprolol succinate  100 mg Oral Daily  . predniSONE  30 mg Oral Daily  . rosuvastatin  10 mg Oral QPM  . saccharomyces boulardii  250 mg Oral BID  . tacrolimus  1 mg Oral BID  . Warfarin - Pharmacist Dosing Inpatient   Does not apply q1800   Continuous Infusions:   PRN Meds:.alum & mag hydroxide-simeth, guaiFENesin-dextromethorphan, hydrALAZINE, HYDROcodone-acetaminophen, labetalol, levalbuterol, metoprolol, ondansetron (ZOFRAN) IV, phenol  Micro Results Recent Results (from the past 240 hour(s))  C difficile quick scan w PCR reflex     Status: None   Collection Time: 11/04/15  9:42 AM  Result Value Ref Range Status   C Diff antigen NEGATIVE NEGATIVE Final   C Diff toxin NEGATIVE NEGATIVE Final   C Diff interpretation No C. difficile detected.  Final  Surgical PCR screen     Status: Abnormal   Collection Time: 11/05/15 11:18 PM  Result Value Ref Range Status   MRSA, PCR NEGATIVE NEGATIVE Final   Staphylococcus aureus POSITIVE (A) NEGATIVE Final    Comment:        The Xpert SA Assay (FDA approved for NASAL specimens in patients over 31 years of age), is one component of a comprehensive surveillance program.  Test performance has been validated by [facility] for patients greater than or equal to 8 year old. It is not intended to diagnose infection nor to guide or monitor treatment.   Fungus Culture With Stain (Not @ [facility])      Status: None (Preliminary result)   Collection Time: 11/06/15  2:33 PM  Result Value Ref Range Status   Fungus Stain Final report  Final    Comment: (NOTE) Performed At: [facility] [place], [place] [id number] Lindon Romp MD [id number]    Fungus (Mycology) Culture PENDING  Incomplete   Fungal Source WOUND  Final    Comment: LEFT TOE POF VANC AND ZOSYN   Acid Fast Smear (AFB)  Status: None   Collection Time: 11/06/15  2:33 PM  Result Value Ref Range Status   AFB Specimen Processing Concentration  Final   Acid Fast Smear Negative  Final    Comment: (NOTE) Performed At: [facility] [place], [place] [id number] Lindon Romp MD [id number]    Source (AFB) WOUND  Final    Comment: LEFT TOE POF VANC AND ZOSYN   Aerobic Culture (superficial specimen)     Status: None   Collection Time: 11/06/15  2:33 PM  Result Value Ref Range Status   Specimen Description WOUND LEFT TOE  Final   Special Requests GREAT TOE POF VANC AND ZOSYN  Final   Gram Stain   Final    NO WBC SEEN FEW SQUAMOUS EPITHELIAL CELLS PRESENT FEW GRAM POSITIVE COCCI IN CLUSTERS    Culture MULTIPLE ORGANISMS PRESENT, NONE PREDOMINANT  Final   Report Status 11/08/2015 FINAL  Final  Fungus Culture Result     Status: None   Collection Time: 11/06/15  2:33 PM  Result Value Ref Range Status   Result 1 Comment  Final    Comment: (NOTE) KOH/Calcofluor preparation:  no fungus observed. Performed At: [facility] [place], [place] [id number] Lindon Romp MD [id number]   C difficile quick scan w PCR reflex     Status: None   Collection Time: 11/09/15  4:08 PM  Result Value Ref Range Status   C Diff antigen NEGATIVE NEGATIVE Final   C Diff toxin NEGATIVE NEGATIVE Final   C Diff interpretation No C. difficile detected.  Final    Radiology Reports Dg Chest Port 1 View  Result Date: 11/09/2015 CLINICAL DATA:  Shortness of  breath EXAM: PORTABLE CHEST 1 VIEW COMPARISON:  November 06, 2015 FINDINGS: Mild cardiomegaly. The hila and mediastinum are unchanged. Diffuse bilateral interstitial opacities again identified and similar in the interval. The opacity is a little more focal in the left suprahilar region which is increased since November 05, 2015 but unchanged since November 06, 2015. No other acute interval changes. There may be tiny bilateral effusions with blunting of the costophrenic angles. IMPRESSION: 1. Suspected tiny bilateral pleural effusions and diffuse increased interstitial markings are most consistent with pulmonary edema. Atypical viral infection could have a similar appearance. Recommend clinical correlation. *Opacity in the left suprahilar region is a little more focal when compared November 05, 2015. The difference could be due to confluence of shadows. Recommend follow-up to resolution. Electronically Signed   By: Dorise Bullion III M.D   On: 11/09/2015 09:14   Dg Chest Port 1 View  Result Date: 11/06/2015 CLINICAL DATA:  75 year old male with increasing shortness of breath and abnormal left side pulmonary auscultation. Initial encounter. EXAM: PORTABLE CHEST 1 VIEW COMPARISON:  0508 hours today and earlier. FINDINGS: Portable AP semi upright view at 2127 hours. Continued and mildly increased perihilar and basilar predominant interstitial pulmonary opacity. Stable cardiac size and mediastinal contours. No pneumothorax. No pleural effusion or consolidation identified. IMPRESSION: Increased bilateral pulmonary interstitial opacity. Top differential considerations include progression of interstitial edema versus viral/ atypical respiratory infection. No pleural effusion identified. Electronically Signed   By: Genevie Ann M.D.   On: 11/06/2015 21:42   Dg Chest Port 1 View  Result Date: 11/06/2015 CLINICAL DATA:  75 year old male with aspiration of liquid. EXAM: PORTABLE CHEST 1 VIEW COMPARISON:  Chest radiograph dated 11/05/2015  FINDINGS: Mild cardiomegaly with central vascular congestion. Bibasilar Interstitial prominence and nodular densities, new from  prior study may represent atelectatic changes versus infiltrate. There is no pleural effusion or pneumothorax. Top-normal cardiac silhouette. No acute osseous pathology. IMPRESSION: Mild congestive changes with bibasilar subsegmental atelectasis versus infiltrate. Electronically Signed   By: Anner Crete M.D.   On: 11/06/2015 05:16   Dg Chest Port 1 View  Result Date: 11/05/2015 CLINICAL DATA:  Shortness of breath EXAM: PORTABLE CHEST 1 VIEW COMPARISON:  March 14, 2015 FINDINGS: The lungs are clear. Heart is mildly enlarged with pulmonary vascularity within normal limits. No adenopathy. No bone lesions. IMPRESSION: Mild cardiac enlargement.  No edema or consolidation. Electronically Signed   By: Lowella Grip III M.D.   On: 11/05/2015 09:42   Dg Foot Complete Left  Result Date: 10/28/2015 CLINICAL DATA:  Left big toe pain. EXAM: LEFT FOOT - COMPLETE 3+ VIEW COMPARISON:  None. FINDINGS: There is no evidence of fracture or dislocation. No evidence of dystrophic bony changes. There is soft tissue swelling of the distal first digit. Punctate lucencies within the area of soft tissue swelling may represent entrapped fat or potentially foci of gas. Heavy vascular calcifications are seen. IMPRESSION: No evidence of fracture of the left foot. Soft tissue swelling of the distal first digit with punctate lucencies within the area of swelling, which may represent entrapped fat or potentially foci of gas. Please correlate clinically regarding the possibility of cellulitis. Electronically Signed   By: Fidela Salisbury M.D.   On: 10/28/2015 21:01   Time Spent in minutes  22   Estefanie Cornforth M.D on 11/13/2015 at 1:47 PM  Between 7am to 7pm - Pager - [phone]  After 7pm go to [email or web address] - password [facility]  [facility] -  Office  [phone]

## 2015-11-13 NOTE — Progress Notes (Signed)
Dressing changed today.  Amputation site marginal but no indication for more proximal amputation Continue with dressing changes   Wells Allien Melberg

## 2015-11-13 NOTE — Progress Notes (Signed)
ANTICOAGULATION CONSULT NOTE - Follow Up Consult  Pharmacy Consult for Heparin/Coumadin Indication: afib   Allergies  Allergen Reactions  . Tape Other (See Comments)    SKIN WILL TEAR!!    Patient Measurements: Height: 5\' 9"  (175.3 cm) Weight: 242 lb 8.1 oz (110 kg) IBW/kg (Calculated) : 70.7  Vital Signs: Temp: 98.7 F (37.1 C) (08/09 0500) Temp Source: Oral (08/09 0500) BP: 158/47 (08/09 0500) Pulse Rate: 70 (08/09 0500)  Labs:  Recent Labs  11/11/15 0314 11/12/15 0350 11/13/15 0802  HGB 10.6*  --   --   HCT 32.9*  --   --   PLT 284  --   --   LABPROT 38.6* 37.2* 34.8*  INR 3.83 3.65 3.37  HEPARINUNFRC <0.10*  --   --   CREATININE 3.06* 3.28*  --     Estimated Creatinine Clearance: 23.8 mL/min (by C-G formula based on SCr of 3.28 mg/dL).   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 Heparin started 8/3, PTA warfarin restarted 8/4. INR currently supratherapeutic at 3.37 (down from 8/7).  **Warfarin PTA dosing: 5mg  daily except 2.5mg  on Tuesdays and Thursdays.    Goal of Therapy:  INR 2-3 Monitor platelets by anticoagulation protocol: Yes    Plan:  -Hold warfarin tonight -Daily INR  Hildred Laser, Pharm D 11/13/2015 8:33 AM

## 2015-11-13 NOTE — Progress Notes (Signed)
SLP Cancellation Note  Patient Details Name: Mark [name] MRN: [id number] DOB: [birth year]   Cancelled treatment:       Reason Eval/Treat Not Completed: Patient declined, no reason specified. Pt napping and declining treatment. Pt has declined tx several times and when accepted will only have 1-2 bites/ sips of PO trials. RN reported pt with limited appetite but is taking pills with liquid well without overt difficulty. Respiratory status may be improving- noted that pt able to be off oxygen intermittently. Will sign off at this time. Please re-consult if concerns/ needs arise.   Kern Reap, Spi[place], CCC-SLP 11/13/2015, 1:37 PM [phone]

## 2015-11-13 NOTE — Progress Notes (Signed)
[facility] Rounding Note  Subjective: Patient seen and examined this morning. Feels about the same. Breathing is okay intermittently off oxygen. Having some pain in his left leg last night. Creatinine is a little worse today, UOP not very impressive. Understands that dialysis likely to be needed, but would like to give it another day with diuresis. No CP, palpitation.  Objective: BP (!) 158/47 (BP Location: Left Arm)   Pulse 70   Temp 98.7 F (37.1 C) (Oral)   Resp 18   Ht 5\' 9"  (1.753 m)   Wt 242 lb 8.1 oz (110 kg)   SpO2 96%   BMI 35.81 kg/m    Intake/Output Summary (Last 24 hours) at 11/13/15 1131 Last data filed at 11/13/15 [id number]  Gross per 24 hour  Intake              120 ml  Output              575 ml  Net             -455 ml    Weight change:   EXAM: General: lying in bed, on supplemental oxygen, no acute distress HEENT: [place]/AT, EOMI, no scleral icterus Cardiac: irregular rhythm, regular rate, no m/r/g Pulm: diminished breath sounds, normal effort, no wheezes or crackles Abd: soft, mild tenderness at area of kidney on Left, protuberant Ext: warm and well perfused, 2+ lower extremity edema bilaterally. Upper extremity edema present.  Left foot wrapped in clean, dry Ace bandage. GU: condom cath Neuro: alert and oriented X3, cranial nerves II-XII grossly intact   Labs:  Recent Labs Lab 11/11/15 0314 11/12/15 0350 11/13/15 0802  NA 138 135 136  K 4.6 5.6* 5.0  CL 109 107 104  CO2 21* 18* 19*  GLUCOSE 163* 152* 253*  BUN 72* 87* 100*  CREATININE 3.06* 3.28* 3.63*  CALCIUM 8.8* 8.8* 8.8*      Recent Labs Lab 11/09/15 0256 11/10/15 0313 11/11/15 0314  AST 18 16 17   ALT 15* 15* 16*  ALKPHOS 98 93 101  BILITOT 1.1 0.6 0.8  PROT 5.6* 5.5* 5.3*  ALBUMIN 2.0* 2.0* 2.1*     Recent Labs Lab 11/07/15 0055 11/08/15 1055 11/09/15 0256 11/10/15 0313 11/11/15 0314  WBC 9.8 8.9 8.8 9.2 8.6  HGB 12.4* 12.9* 11.6* 10.8* 10.6*  HCT 38.4* 39.4 35.9* 33.3*  32.9*  MCV 94.6 94.5 94.5 94.1 94.3  PLT 281 298 282 294 284     Recent Labs Lab 11/06/15 1540 11/06/15 1940  TROPONINI 0.06* 0.07*      Recent Labs Lab 11/12/15 1236 11/12/15 1622 11/12/15 2101 11/13/15 0556 11/13/15 1126  GLUCAP 209* 200* 334* 280* 285*    ABG    Component Value Date/Time   PHART 7.408 11/07/2015 0235   PCO2ART 30.9 (L) 11/07/2015 0235   PO2ART 71.1 (L) 11/07/2015 0235   HCO3 19.1 (L) 11/07/2015 0235   TCO2 20.1 11/07/2015 0235   ACIDBASEDEF 4.7 (H) 11/07/2015 0235   O2SAT 94.4 11/07/2015 0235   TAC trough level from 8/5 pending  Studies/Results: No results found. Scheduled Meds: . allopurinol  150 mg Oral Daily  . amLODipine  10 mg Oral Daily  . aspirin  81 mg Oral Daily  . calcitRIOL  0.5 mcg Oral Daily  . docusate sodium  100 mg Oral Daily  . famotidine  20 mg Oral QHS  . feeding supplement  1 Container Oral TID WC  . furosemide  160 mg Intravenous Q6H  . hydrALAZINE  25 mg Oral Q8H  . insulin aspart  0-15 Units Subcutaneous TID WC  . insulin aspart  5 Units Subcutaneous TID WC  . insulin glargine  20 Units Subcutaneous QHS  . levothyroxine  137 mcg Oral QAC breakfast  . magnesium oxide  400 mg Oral BID  . metolazone  10 mg Oral Daily  . metoprolol succinate  100 mg Oral Daily  . predniSONE  30 mg Oral Daily  . rosuvastatin  10 mg Oral QPM  . saccharomyces boulardii  250 mg Oral BID  . tacrolimus  1 mg Oral BID  . Warfarin - Pharmacist Dosing Inpatient   Does not apply q1800   PRN Meds alum & mag hydroxide-simeth, guaiFENesin-dextromethorphan, hydrALAZINE, HYDROcodone-acetaminophen, labetalol, levalbuterol, metoprolol, ondansetron (ZOFRAN) IV, phenol  Infusion Meds   Background: 75 y.o. male.  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  Creatine baseline appears to be 1.8-2.0.  Primary nephrologist is Dr. Justin Mend. Saw  him on 06/26/2015 with BUN 27, creatinine 1.88.  Admitted on 10/28/2015 with necrotic left great toe with cellulitis. Creatinine on admission was 2.6.  Seen by [name] in consult on 7/25.  Creatinine had been improving until last couple days when started to trend back up post aortogram and in setting of being up 13 liters since admission.  Assessment/Plan:  Renal transplant (2005)  with AKI on CKD4 post contrast administration - Baseline creatinine 1.88-2 Timing of increased creatinine coincides with aortogram on August 1, however, atheroembolic event remains possible as well given his stuttering creatinine increases.  It appears he received 60cc of contrast during procedure which is a moderate contrast load given his GFR Renal allograft function worse today with Creatinine 3.63 from 3.28.  Diuresis has not been striking thus far.  575 cc out yesterday recorded. - Potassium this morning 5.0 - Steroids increased to 30mg  daily for any component of acute rejection. - repeat Tacrolimus level is 7.3. - continue Lasix 160mg  Q6H. - increase metolazone to 10mg  daily. - likely to need HD this hospitalization, will give another day of diuresis to see if any improvement. - if HD planned, will need access.  CKD-MBD  On calcitriol 0.51mcg daily, phosphorous 250mg  BID PTH on 7/25 was 82  HTN/Volume:   BP has been 118/44 to 158/66 past 24 hours.   Still volume overloaded.   Hydralazine TID in addition to amlodipine and Lasix.   Cardiology following.   Continuing current medications  DM Per primary service  AFib:  BB for rate control, on heparin, coumadin per [facility]  Gangrene/Cellulitis/Toe Amputation:   S/p atherectomy and DCB L popliteal art, and PTA left anterior tibial artery 11/05/2015.   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   Off abx [facility] following.  HLD  Hypothyroidism   Jule Ser  11:31 AM   Renal Attending:  Difficult prob with poor response to diuretic treatment and gen  malaise.  Findings c/w prob need for HD.  Not sure if acute component will improve . WIll adjust diuretics further and see if there is improved UOP and renal fct. Lean Jaeger C

## 2015-11-13 NOTE — Progress Notes (Signed)
PT Cancellation Note  Patient Details Name: ORLO HOPP MRN: [id number] DOB: Oct 01, 1940   Cancelled Treatment:    Reason Eval/Treat Not Completed: Other (comment) (Pt refused.  Pt states,"Come back later."  Will try to return at later time as able.)   Denice Paradise 11/13/2015, 10:43 AM [facility] [phone] (pager)

## 2015-11-13 NOTE — Progress Notes (Signed)
Patient Name: Mark [name] Date of Encounter: 11/13/2015     Principal Problem:   Gangrene of toe ([place]) Active Problems:   Type II diabetes mellitus, uncontrolled ([place])   Renal transplant recipient   Diabetic ulcer of left great toe ([facility])   Cellulitis of great toe of left foot   Aspiration of liquid   SOB (shortness of breath)   Permanent atrial fibrillation ([facility])   Bibasilar crackles   CHF (congestive heart failure) ([place])    SUBJECTIVE  Denies any CP or SOB.   CURRENT MEDS . allopurinol  150 mg Oral Daily  . amLODipine  10 mg Oral Daily  . aspirin  81 mg Oral Daily  . calcitRIOL  0.5 mcg Oral Daily  . docusate sodium  100 mg Oral Daily  . famotidine  20 mg Oral QHS  . feeding supplement  1 Container Oral TID WC  . furosemide  160 mg Intravenous Q6H  . hydrALAZINE  25 mg Oral Q8H  . insulin aspart  0-15 Units Subcutaneous TID WC  . insulin aspart  5 Units Subcutaneous TID WC  . insulin glargine  20 Units Subcutaneous QHS  . levothyroxine  137 mcg Oral QAC breakfast  . magnesium oxide  400 mg Oral BID  . metolazone  10 mg Oral Daily  . metoprolol succinate  100 mg Oral Daily  . predniSONE  30 mg Oral Daily  . rosuvastatin  10 mg Oral QPM  . saccharomyces boulardii  250 mg Oral BID  . tacrolimus  1 mg Oral BID  . Warfarin - Pharmacist Dosing Inpatient   Does not apply q1800    OBJECTIVE  Vitals:   11/12/15 0500 11/12/15 1300 11/12/15 2010 11/13/15 0500  BP: (!) 119/52 132/60 (!) 118/44 (!) 158/47  Pulse: 62 74 66 70  Resp: 18 20 20 18   Temp: 97.7 F (36.5 C) 97.6 F (36.4 C) 97.3 F (36.3 C) 98.7 F (37.1 C)  TempSrc: Oral Oral Oral Oral  SpO2: 99% 98% 98% 96%  Weight:      Height:        Intake/Output Summary (Last 24 hours) at 11/13/15 1118 Last data filed at 11/13/15 [id number]  Gross per 24 hour  Intake              120 ml  Output              575 ml  Net             -455 ml   Filed Weights   11/03/15 2119 11/04/15 2042 11/06/15 0317    Weight: 223 lb 12.3 oz (101.5 kg) 243 lb 9.7 oz (110.5 kg) 242 lb 8.1 oz (110 kg)    PHYSICAL EXAM  General: Pleasant, NAD. Neuro: Alert and oriented X 3. Moves all extremities spontaneously. Psych: Normal affect. HEENT:  Normal  Neck: Supple without bruits or JVD. Lungs:  Resp regular and unlabored, CTA. Heart: RRR no s3, s4, or murmurs. Abdomen: Soft, non-tender, non-distended, BS + x 4.  Extremities: No clubbing, cyanosis. DP/PT/Radials 2+ and equal bilaterally. Diffuse anasarca, 3+ pitting edema throughout  Accessory Clinical Findings  CBC  Recent Labs  11/11/15 0314  WBC 8.6  HGB 10.6*  HCT 32.9*  MCV 94.3  PLT [id number]   Basic Metabolic Panel  Recent Labs  11/12/15 0350 11/13/15 0802  NA 135 136  K 5.6* 5.0  CL 107 104  CO2 18* 19*  GLUCOSE 152* 253*  BUN 87* 100*  CREATININE 3.28* 3.63*  CALCIUM 8.8* 8.8*   Liver Function Tests  Recent Labs  11/11/15 0314  AST 17  ALT 16*  ALKPHOS 101  BILITOT 0.8  PROT 5.3*  ALBUMIN 2.1*    TELE A-fib with HR 70s, longest pause 2.17 sec    ECG  No new EKG  Echocardiogram 10/31/2015  LV EF: 65% -   70%  ------------------------------------------------------------------- Indications:      Pre-op evaluation V728.1.  ------------------------------------------------------------------- History:   PMH:   Atrial fibrillation.  ------------------------------------------------------------------- Study Conclusions  - Left ventricle: The cavity size was normal. There was moderate   concentric hypertrophy. Systolic function was vigorous. The   estimated ejection fraction was in the range of 65% to 70%. Wall   motion was normal; there were no regional wall motion   abnormalities. Features are consistent with a pseudonormal left   ventricular filling pattern, with concomitant abnormal relaxation   and increased filling pressure (grade 2 diastolic dysfunction). - Aortic valve: Trileaflet; mildly thickened,  mildly calcified   leaflets. - Mitral valve: Moderately calcified annulus. - Left atrium: The atrium was mildly dilated. Volume/bsa, ES,   (1-plane Simpson&'s, A2C): 36.1 ml/m^2. - Right atrium: The atrium was moderately dilated. - Pulmonary arteries: Systolic pressure was moderately increased.   PA peak pressure: 53 mm Hg (S).    Radiology/Studies  Dg Chest Port 1 View  Result Date: 11/09/2015 CLINICAL DATA:  Shortness of breath EXAM: PORTABLE CHEST 1 VIEW COMPARISON:  November 06, 2015 FINDINGS: Mild cardiomegaly. The hila and mediastinum are unchanged. Diffuse bilateral interstitial opacities again identified and similar in the interval. The opacity is a little more focal in the left suprahilar region which is increased since November 05, 2015 but unchanged since November 06, 2015. No other acute interval changes. There may be tiny bilateral effusions with blunting of the costophrenic angles. IMPRESSION: 1. Suspected tiny bilateral pleural effusions and diffuse increased interstitial markings are most consistent with pulmonary edema. Atypical viral infection could have a similar appearance. Recommend clinical correlation. *Opacity in the left suprahilar region is a little more focal when compared November 05, 2015. The difference could be due to confluence of shadows. Recommend follow-up to resolution. Electronically Signed   By: Dorise Bullion III M.D   On: 11/09/2015 09:14   Dg Chest Port 1 View  Result Date: 11/06/2015 CLINICAL DATA:  75 year old male with increasing shortness of breath and abnormal left side pulmonary auscultation. Initial encounter. EXAM: PORTABLE CHEST 1 VIEW COMPARISON:  0508 hours today and earlier. FINDINGS: Portable AP semi upright view at 2127 hours. Continued and mildly increased perihilar and basilar predominant interstitial pulmonary opacity. Stable cardiac size and mediastinal contours. No pneumothorax. No pleural effusion or consolidation identified. IMPRESSION: Increased  bilateral pulmonary interstitial opacity. Top differential considerations include progression of interstitial edema versus viral/ atypical respiratory infection. No pleural effusion identified. Electronically Signed   By: Genevie Ann M.D.   On: 11/06/2015 21:42   Dg Chest Port 1 View  Result Date: 11/06/2015 CLINICAL DATA:  75 year old male with aspiration of liquid. EXAM: PORTABLE CHEST 1 VIEW COMPARISON:  Chest radiograph dated 11/05/2015 FINDINGS: Mild cardiomegaly with central vascular congestion. Bibasilar Interstitial prominence and nodular densities, new from prior study may represent atelectatic changes versus infiltrate. There is no pleural effusion or pneumothorax. Top-normal cardiac silhouette. No acute osseous pathology. IMPRESSION: Mild congestive changes with bibasilar subsegmental atelectasis versus infiltrate. Electronically Signed   By: Anner Crete M.D.   On: 11/06/2015 05:16   Dg  Chest Port 1 View  Result Date: 11/05/2015 CLINICAL DATA:  Shortness of breath EXAM: PORTABLE CHEST 1 VIEW COMPARISON:  March 14, 2015 FINDINGS: The lungs are clear. Heart is mildly enlarged with pulmonary vascularity within normal limits. No adenopathy. No bone lesions. IMPRESSION: Mild cardiac enlargement.  No edema or consolidation. Electronically Signed   By: Lowella Grip III M.D.   On: 11/05/2015 09:42   Dg Foot Complete Left  Result Date: 10/28/2015 CLINICAL DATA:  Left big toe pain. EXAM: LEFT FOOT - COMPLETE 3+ VIEW COMPARISON:  None. FINDINGS: There is no evidence of fracture or dislocation. No evidence of dystrophic bony changes. There is soft tissue swelling of the distal first digit. Punctate lucencies within the area of soft tissue swelling may represent entrapped fat or potentially foci of gas. Heavy vascular calcifications are seen. IMPRESSION: No evidence of fracture of the left foot. Soft tissue swelling of the distal first digit with punctate lucencies within the area of swelling, which  may represent entrapped fat or potentially foci of gas. Please correlate clinically regarding the possibility of cellulitis. Electronically Signed   By: Fidela Salisbury M.D.   On: 10/28/2015 21:01   ASSESSMENT AND PLAN  1. Chronic Atrial Fibrillation - This patients CHA2DS2-VASc Score and unadjusted Ischemic Stroke Rate (% per year) is equal to 9.7 % stroke rate/year from a score of 6 (CHF, HTN, DM, Vascular, Age (2)). Heparin stopped, transitioning back to Coumadin. INR supratherapeutic this AM at 3.65. Pharmacy following.  - continue BB for rate-control. He is having pauses up to 2.17 seconds. Digoxin stopped this admission. HR currently well-controlled.  - fecal occult is positive, will obtain CBC tomorrow to trend hgb. Per nephrology considering HD.  2. Chronic Diastolic CHF - echo this admission showing preserved EF of 65-70% with Grade 2 DD.  - despite weight gain, aggressive diuresis has not removed much fluid.  - Diuretic dosing per Nephrology with rising creatinine.  - His acute on chronic heart failure likely compounded by severe hypoalbuminemia with albumin 2.1 on 8/7 which likely contribute to the 3rd spacing of fluid.   3. HTN - BP has been 108/52 - 130/90 in the past 24 hours. - continue current medication regimen.   4. Acute on Stage 4 CKD - s/p renal transplant in 2005, baseline creatinine 1.8 - 2.0.  - Nephrology following. On higher dose of steroid for any possible rejection.   - renal function continue to worsen.  5. Gangrene of L great toe - s/p amputation on 8/2 - per Vascular Surgery.   Hilbert Corrigan PA Pager: [phone]  I have examined the patient and reviewed assessment and plan and discussed with patient.  Agree with above as stated.  Afib rate contrlled.  Volume overloaded.  Possible dialysis per nephrology.  Patient reports melena as well.   Larae Grooms

## 2015-11-13 NOTE — Progress Notes (Signed)
Inpatient Diabetes Program Recommendations  AACE/ADA: New Consensus Statement on Inpatient Glycemic Control (2015)  Target Ranges:  Prepandial:   less than 140 mg/dL      Peak postprandial:   less than 180 mg/dL (1-2 hours)      Critically ill patients:  140 - 180 mg/dL   Lab Results  Component Value Date   GLUCAP 280 (H) 11/13/2015   HGBA1C 8.2 (H) 10/29/2015    Review of Glycemic Control  Results for HOLDYN, BERGAMO (MRN [id number]) as of 11/13/2015 09:18  Ref. Range 11/12/2015 06:15 11/12/2015 12:36 11/12/2015 16:22 11/12/2015 21:01 11/13/2015 05:56  Glucose-Capillary Latest Ref Range: 65 - 99 mg/dL 160 (H) 209 (H) 200 (H) 334 (H) 280 (H)   Diabetes history:DM2 Outpatient Diabetes medications: V-Go insulin pump (78 units R is max)  Current orders for Inpatient glycemic control: Lantus 20 units QHS, Novolog moderate tid + Novolog 5 units tidwc  Please consider increasing Lantus to 25 units qhs. Since the patient is not eating well, please consider changing the Novolog moderate correction insulin to q4h  Gentry Fitz, RN, [place], [place], CDE Diabetes Coordinator Inpatient Diabetes Program  [phone] (Team Pager) [phone] ([place]) 11/13/2015 9:22 AM

## 2015-11-13 NOTE — Care Management Important Message (Signed)
Important Message  Patient Details  Name: Mark [name] MRN: [id number] Date of Birth: 07/18/1940   Medicare Important Message Given:  Yes    Oralee Rapaport, [place] 11/13/2015, 2:53 PM

## 2015-11-13 NOTE — Progress Notes (Signed)
OT Cancellation Note  Patient Details Name: Mark [name] MRN: [id number] DOB: 31-Jan-1941   Cancelled Treatment:    Reason Eval/Treat Not Completed: Fatigue/lethargy limiting ability to participate. Pt reported severe fatigue and does not wish to work with therapy at this time. Will try to check back tomorrow before pt goes to HD if schedule allows.  Redmond Baseman, OTR/L Pager: [phone] 11/13/2015, 3:24 PM

## 2015-11-14 ENCOUNTER — Encounter (HOSPITAL_COMMUNITY): Payer: Self-pay | Admitting: Diagnostic Radiology

## 2015-11-14 ENCOUNTER — Inpatient Hospital Stay (HOSPITAL_COMMUNITY): Payer: Medicare Other

## 2015-11-14 DIAGNOSIS — N19 Unspecified kidney failure: Secondary | ICD-10-CM

## 2015-11-14 DIAGNOSIS — N186 End stage renal disease: Secondary | ICD-10-CM

## 2015-11-14 HISTORY — PX: IR GENERIC HISTORICAL: IMG1180011

## 2015-11-14 LAB — CBC
HCT: 32 % — ABNORMAL LOW (ref 39.0–52.0)
HCT: 32.1 % — ABNORMAL LOW (ref 39.0–52.0)
HEMOGLOBIN: 10.5 g/dL — AB (ref 13.0–17.0)
Hemoglobin: 10.7 g/dL — ABNORMAL LOW (ref 13.0–17.0)
MCH: 31.3 pg (ref 26.0–34.0)
MCH: 31.6 pg (ref 26.0–34.0)
MCHC: 32.8 g/dL (ref 30.0–36.0)
MCHC: 33.3 g/dL (ref 30.0–36.0)
MCV: 95.2 fL (ref 78.0–100.0)
MCV: 94.7 fL (ref 78.0–100.0)
PLATELETS: 296 10*3/uL (ref 150–400)
PLATELETS: 304 10*3/uL (ref 150–400)
RBC: 3.36 MIL/uL — AB (ref 4.22–5.81)
RBC: 3.39 MIL/uL — AB (ref 4.22–5.81)
RDW: 16.6 % — AB (ref 11.5–15.5)
RDW: 16.9 % — ABNORMAL HIGH (ref 11.5–15.5)
WBC: 9.9 10*3/uL (ref 4.0–10.5)
WBC: 7.7 10*3/uL (ref 4.0–10.5)

## 2015-11-14 LAB — GLUCOSE, CAPILLARY
GLUCOSE-CAPILLARY: 183 mg/dL — AB (ref 65–99)
GLUCOSE-CAPILLARY: 149 mg/dL — AB (ref 65–99)
Glucose-Capillary: 176 mg/dL — ABNORMAL HIGH (ref 65–99)
Glucose-Capillary: 217 mg/dL — ABNORMAL HIGH (ref 65–99)

## 2015-11-14 LAB — COMPREHENSIVE METABOLIC PANEL
ALT: 19 U/L (ref 17–63)
AST: 20 U/L (ref 15–41)
Albumin: 2.5 g/dL — ABNORMAL LOW (ref 3.5–5.0)
Alkaline Phosphatase: 116 U/L (ref 38–126)
Anion gap: 11 (ref 5–15)
BILIRUBIN TOTAL: 0.8 mg/dL (ref 0.3–1.2)
BUN: 109 mg/dL — AB (ref 6–20)
CO2: 21 mmol/L — ABNORMAL LOW (ref 22–32)
CREATININE: 4.53 mg/dL — AB (ref 0.61–1.24)
Calcium: 8.7 mg/dL — ABNORMAL LOW (ref 8.9–10.3)
Chloride: 103 mmol/L (ref 101–111)
GFR, EST AFRICAN AMERICAN: 13 mL/min — AB (ref >60)
GFR, EST NON AFRICAN AMERICAN: 12 mL/min — AB (ref >60)
Glucose, Bld: 209 mg/dL — ABNORMAL HIGH (ref 65–99)
POTASSIUM: 4.4 mmol/L (ref 3.5–5.1)
Sodium: 135 mmol/L (ref 135–145)
TOTAL PROTEIN: 5.9 g/dL — AB (ref 6.5–8.1)

## 2015-11-14 LAB — RENAL FUNCTION PANEL
ANION GAP: 10 (ref 5–15)
Albumin: 2.6 g/dL — ABNORMAL LOW (ref 3.5–5.0)
BUN: 116 mg/dL — ABNORMAL HIGH (ref 6–20)
CALCIUM: 8.6 mg/dL — AB (ref 8.9–10.3)
CO2: 20 mmol/L — AB (ref 22–32)
Chloride: 102 mmol/L (ref 101–111)
Creatinine, Ser: 4.95 mg/dL — ABNORMAL HIGH (ref 0.61–1.24)
GFR calc non Af Amer: 10 mL/min — ABNORMAL LOW (ref >60)
GFR, EST AFRICAN AMERICAN: 12 mL/min — AB (ref >60)
Glucose, Bld: 210 mg/dL — ABNORMAL HIGH (ref 65–99)
Phosphorus: 7 mg/dL — ABNORMAL HIGH (ref 2.5–4.6)
Potassium: 4.8 mmol/L (ref 3.5–5.1)
SODIUM: 132 mmol/L — AB (ref 135–145)

## 2015-11-14 LAB — PROTIME-INR
INR: 3.38
PROTHROMBIN TIME: 35 s — AB (ref 11.4–15.2)

## 2015-11-14 MED ORDER — SODIUM CHLORIDE 0.9 % IV SOLN: 100.0000 mL | INTRAVENOUS | Status: DC | PRN

## 2015-11-14 MED ORDER — PENTAFLUOROPROP-TETRAFLUOROETH EX AERO: 1.0000 "application " | INHALATION_SPRAY | CUTANEOUS | Status: DC | PRN

## 2015-11-14 MED ORDER — LIDOCAINE HCL (PF) 1 % IJ SOLN: 5.0000 mL | INTRAMUSCULAR | Status: DC | PRN

## 2015-11-14 MED ORDER — LIDOCAINE-PRILOCAINE 2.5-2.5 % EX CREA: 1.0000 "application " | TOPICAL_CREAM | CUTANEOUS | Status: DC | PRN

## 2015-11-14 MED ORDER — LIDOCAINE HCL 1 % IJ SOLN
INTRAMUSCULAR | Status: AC
  Filled 2015-11-14: qty 20

## 2015-11-14 MED ORDER — HEPARIN SODIUM (PORCINE) 1000 UNIT/ML IJ SOLN
INTRAMUSCULAR | Status: DC | PRN
  Administered 2015-11-14: 2.8 mL

## 2015-11-14 MED ORDER — ALTEPLASE 2 MG IJ SOLR: 2.0000 mg | Freq: Once | INTRAMUSCULAR | Status: DC | PRN

## 2015-11-14 MED ORDER — HEPARIN SODIUM (PORCINE) 1000 UNIT/ML IJ SOLN
INTRAMUSCULAR | Status: AC
  Filled 2015-11-14: qty 1

## 2015-11-14 MED ORDER — LIDOCAINE HCL 1 % IJ SOLN
INTRAMUSCULAR | Status: DC | PRN
  Administered 2015-11-14: 10 mL

## 2015-11-14 MED ORDER — HYDROCODONE-ACETAMINOPHEN 5-325 MG PO TABS
1.0000 | ORAL_TABLET | ORAL | Status: DC | PRN
  Administered 2015-11-14: 1 via ORAL
  Administered 2015-11-14 – 2015-11-15 (×3): 2 via ORAL
  Administered 2015-11-15: 1 via ORAL
  Administered 2015-11-15 – 2015-11-17 (×4): 2 via ORAL
  Administered 2015-11-18: 1 via ORAL
  Administered 2015-11-18 – 2015-11-21 (×6): 2 via ORAL
  Filled 2015-11-14 (×5): qty 2
  Filled 2015-11-14: qty 1
  Filled 2015-11-14 (×4): qty 2
  Filled 2015-11-14: qty 1
  Filled 2015-11-14 (×4): qty 2
  Filled 2015-11-14: qty 1

## 2015-11-14 MED ORDER — HEPARIN SODIUM (PORCINE) 1000 UNIT/ML DIALYSIS: 1000.0000 [IU] | INTRAMUSCULAR | Status: DC | PRN

## 2015-11-14 MED ORDER — LIDOCAINE-PRILOCAINE 2.5-2.5 % EX CREA
1.0000 "application " | TOPICAL_CREAM | CUTANEOUS | Status: DC | PRN
  Filled 2015-11-14: qty 5

## 2015-11-14 NOTE — Progress Notes (Signed)
Physical Therapy Treatment Patient Details Name: Mark [name] MRN: [id number] DOB: November 20, 1940 Today's Date: 11/14/2015    History of Present Illness 74 y.o. male s/p L great toe amputation. PMH significant HTN, afib on Coumadin, ESRD s/p kidney tranplant 12 years ago, and DM type II.    PT Comments    Pt admitted with above diagnosis. Pt currently with functional limitations due to balance and endurance deficits. Pt was able to ambulate to door with one sitting rest break.  Pt pleasant today and even joking with therapists.  Pt does have balance issues as Darco shoe is difficult to balance with and could not put the right shoe on as pt with incr edema right LE.  Will continue to progress as able.   Pt will benefit from skilled PT to increase their independence and safety with mobility to allow discharge to the venue listed below.  ,  Follow Up Recommendations  SNF;Supervision - Intermittent     Equipment Recommendations  Rolling walker with 5" wheels    Recommendations for Other Services       Precautions / Restrictions Precautions Precautions: Fall Required Braces or Orthoses: Other Brace/Splint Other Brace/Splint: Darco shoe Restrictions Weight Bearing Restrictions: Yes RLE Weight Bearing: Weight bearing as tolerated LLE Weight Bearing: Partial weight bearing    Mobility  Bed Mobility Overal bed mobility: Needs Assistance Bed Mobility: Supine to Sit     Supine to sit: Mod assist;+2 for physical assistance     General bed mobility comments: Mod assist for trunk support to come to sitting position. VCs for hand placement.  Transfers Overall transfer level: Needs assistance Equipment used: Rolling walker (2 wheeled) Transfers: Sit to/from Stand Sit to Stand: Mod assist;+2 safety/equipment;From elevated surface         General transfer comment: Pt needed assist to power up.  Pt needed cues to sequence steps and RW.    Ambulation/Gait Ambulation/Gait  assistance: Min assist;+2 safety/equipment Ambulation Distance (Feet): 15 Feet (5 feet and then 10 feet) Assistive device: Rolling walker (2 wheeled) Gait Pattern/deviations: Step-through pattern;Decreased stride length Gait velocity: decreased Gait velocity interpretation: Below normal speed for age/gender General Gait Details: Slow, mildly unsteady gait. DOE. Pt was able to ambulate with cues to stay close to RW as he tends to push it out too far.  Poor endurance with pt only able to go short distances.  Pt with flexing posture due to fatigue.  Seated rest break x 1.     Stairs            Wheelchair Mobility    Modified Rankin (Stroke Patients Only)       Balance Overall balance assessment: Needs assistance Sitting-balance support: Bilateral upper extremity supported;Feet supported Sitting balance-Leahy Scale: Good     Standing balance support: Bilateral upper extremity supported;During functional activity Standing balance-Leahy Scale: Poor Standing balance comment: Pt relies on UE support for balance.                      Cognition Arousal/Alertness: Awake/alert Behavior During Therapy: WFL for tasks assessed/performed Overall Cognitive Status: Within Functional Limits for tasks assessed Area of Impairment: Safety/judgement         Safety/Judgement: Decreased awareness of safety          Exercises      General Comments General comments (skin integrity, edema, etc.): Pt very edematous.  Right Shoe would not fit pt but darco was in place on left foot.  Pt going for  dialysis later today.       Pertinent Vitals/Pain Pain Assessment: Faces Faces Pain Scale: Hurts a little bit Pain Location: LLE Pain Descriptors / Indicators: Aching;Discomfort Pain Intervention(s): Limited activity within patient's tolerance;Monitored during session;Repositioned;Premedicated before session  VSS    Home Living                      Prior Function             PT Goals (current goals can now be found in the care plan section) Progress towards PT goals: Progressing toward goals    Frequency  Min 2X/week    PT Plan Current plan remains appropriate    Co-evaluation             End of Session Equipment Utilized During Treatment: Gait belt;Oxygen Activity Tolerance: Patient limited by fatigue Patient left: in chair;with call bell/phone within reach;with chair alarm set     Time: [id number] PT Time Calculation (min) (ACUTE ONLY): 38 min  Charges:  $Gait Training: 8-22 mins                    G Codes:      [place] Pick 11-30-2015, 12:02 PM Sherriann Szuch,PT Acute Rehabilitation [phone] (pager)

## 2015-11-14 NOTE — Procedures (Signed)
Placement of right jugular Temp Cath for dialysis.  Tip at superior cavoatrial junction.  No immediate complication.  Minimal blood loss.

## 2015-11-14 NOTE — Progress Notes (Signed)
Occupational Therapy Treatment Patient Details Name: Mark [name] MRN: [id number] DOB: 1940-06-04 Today's Date: 11/14/2015    History of present illness 75 y.o. male s/p L great toe amputation. PMH significant HTN, afib on Coumadin, ESRD s/p kidney tranplant 12 years ago, and DM type II.   OT comments  Pt agreeable to participate with therapy today before HD. Pt tolerated ambulating to room door on 2L [place] with 1 rest break. Pt continues to required mod assist for basic transfers due to generalized weakness and balance deficits as Darco shoe (R foot edema preventing from donning shoe). Will continue to follow acutely to address ADL performance.    Follow Up Recommendations  SNF;Supervision/Assistance - 24 hour    Equipment Recommendations  Other (comment) (TBD at next venue)    Recommendations for Other Services      Precautions / Restrictions Precautions Precautions: Fall Required Braces or Orthoses: Other Brace/Splint Other Brace/Splint: Darco shoe Restrictions Weight Bearing Restrictions: Yes RLE Weight Bearing: Weight bearing as tolerated LLE Weight Bearing: Partial weight bearing (through heel only with post-op shoe)       Mobility Bed Mobility Overal bed mobility: Needs Assistance Bed Mobility: Supine to Sit     Supine to sit: Mod assist     General bed mobility comments: Mod assist for trunk support to come to sitting position. VCs for hand placement.  Transfers Overall transfer level: Needs assistance Equipment used: Rolling walker (2 wheeled) Transfers: Sit to/from Stand Sit to Stand: Mod assist;+2 safety/equipment         General transfer comment: Mod assist for boost to stand and to stabilize balance upon standing. +2 required for chair follow as pt fatigues quickly. VCs for hand placement and breathing strategies.    Balance Overall balance assessment: Needs assistance Sitting-balance support: Bilateral upper extremity supported;Feet  supported Sitting balance-Leahy Scale: Good     Standing balance support: Bilateral upper extremity supported;During functional activity Standing balance-Leahy Scale: Poor Standing balance comment: Heavy reliance on UE support for balance                   ADL Overall ADL's : Needs assistance/impaired Eating/Feeding: Set up;Sitting   Grooming: Set up;Sitting                   Toilet Transfer: Moderate assistance;Ambulation;BSC;RW   Toileting- Clothing Manipulation and Hygiene: Moderate assistance;Sit to/from stand         General ADL Comments: Pt needs to be pushed to participate. Will refuse if given the chance.      Vision                     Perception     Praxis      Cognition   Behavior During Therapy: WFL for tasks assessed/performed Overall Cognitive Status: Within Functional Limits for tasks assessed Area of Impairment: Safety/judgement          Safety/Judgement: Decreased awareness of safety          Extremity/Trunk Assessment               Exercises     Shoulder Instructions       General Comments      Pertinent Vitals/ Pain       Pain Assessment: Faces Faces Pain Scale: Hurts a little bit Pain Location: L foot Pain Descriptors / Indicators: Aching;Discomfort Pain Intervention(s): Limited activity within patient's tolerance;Monitored during session;Premedicated before session;Repositioned  Home Living  Prior Functioning/Environment              Frequency Min 2X/week     Progress Toward Goals  OT Goals(current goals can now be found in the care plan section)  Progress towards OT goals: Progressing toward goals  Acute Rehab OT Goals Patient Stated Goal: go home, not a rest home OT Goal Formulation: With patient Time For Goal Achievement: 11/22/15 Potential to Achieve Goals: Fair ADL Goals Pt Will Perform Grooming: with set-up;with  supervision;standing Pt Will Perform Lower Body Bathing: with min assist;sitting/lateral leans;sit to/from stand Pt Will Perform Lower Body Dressing: with mod assist;with min assist;sitting/lateral leans;sit to/from stand Pt Will Transfer to Toilet: with supervision;ambulating;grab bars Pt Will Perform Toileting - Clothing Manipulation and hygiene: with min guard assist;sit to/from stand  Plan Discharge plan remains appropriate    Co-evaluation    PT/OT/SLP Co-Evaluation/Treatment: Yes Reason for Co-Treatment: For patient/therapist safety   OT goals addressed during session: ADL's and self-care;Proper use of Adaptive equipment and DME      End of Session Equipment Utilized During Treatment: Gait belt;Rolling walker;Oxygen   Activity Tolerance Patient limited by fatigue   Patient Left in chair;with call bell/phone within reach;with chair alarm set   Nurse Communication Mobility status        Time: [id number] OT Time Calculation (min): 38 min  Charges: OT General Charges $OT Visit: 1 Procedure OT Treatments $Self Care/Home Management : 23-37 mins  Redmond Baseman, OTR/L Pager: [phone] 11/14/2015, 1:47 PM

## 2015-11-14 NOTE — Progress Notes (Signed)
ANTICOAGULATION CONSULT NOTE - Follow Up Consult  Pharmacy Consult for Heparin/Coumadin Indication: afib   Allergies  Allergen Reactions  . Tape Other (See Comments)    SKIN WILL TEAR!!    Patient Measurements: Height: 5\' 9"  (175.3 cm) Weight: 242 lb 8.1 oz (110 kg) IBW/kg (Calculated) : 70.7  Vital Signs: Temp: 97.5 F (36.4 C) (08/10 0412) Temp Source: Oral (08/10 0412) BP: 146/48 (08/10 0412) Pulse Rate: 61 (08/10 0412)  Labs:  Recent Labs  11/12/15 0350 11/13/15 0802 11/13/15 1357 11/14/15 0430  HGB  --   --  10.2* 10.5*  HCT  --   --  30.9* 32.0*  PLT  --   --  279 296  LABPROT 37.2* 34.8*  --  35.0*  INR 3.65 3.37  --  3.38  CREATININE 3.28* 3.63*  --  4.53*    Estimated Creatinine Clearance: 17.2 mL/min (by C-G formula based on SCr of 4.53 mg/dL).   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 Heparin started 8/3, PTA warfarin restarted 8/4. INR currently supratherapeutic at 3.38.  **Warfarin PTA dosing: 5mg  daily except 2.5mg  on Tuesdays and Thursdays.    Goal of Therapy:  INR 2-3 Monitor platelets by anticoagulation protocol: Yes    Plan:  -Hold warfarin tonight -Daily INR  Hildred Laser, Pharm D 11/14/2015 8:25 AM

## 2015-11-14 NOTE — Progress Notes (Signed)
Patient Name: Mark [name] Date of Encounter: 11/14/2015     Principal Problem:   Gangrene of toe ([place]) Active Problems:   Type II diabetes mellitus, uncontrolled ([place])   Renal transplant recipient   Diabetic ulcer of left great toe ([facility])   Cellulitis of great toe of left foot   Aspiration of liquid   SOB (shortness of breath)   Permanent atrial fibrillation ([facility])   Bibasilar crackles   CHF (congestive heart failure) ([facility])   Melena    SUBJECTIVE  Denies any CP or SOB. Still swollen.  CURRENT MEDS . allopurinol  150 mg Oral Daily  . amLODipine  10 mg Oral Daily  . aspirin  81 mg Oral Daily  . calcitRIOL  0.5 mcg Oral Daily  . docusate sodium  100 mg Oral Daily  . famotidine  20 mg Oral QHS  . feeding supplement  1 Container Oral TID WC  . furosemide  160 mg Intravenous Q6H  . hydrALAZINE  25 mg Oral Q8H  . insulin aspart  0-15 Units Subcutaneous TID WC  . insulin aspart  5 Units Subcutaneous TID WC  . insulin glargine  20 Units Subcutaneous QHS  . levothyroxine  137 mcg Oral QAC breakfast  . magnesium oxide  400 mg Oral BID  . metolazone  10 mg Oral Daily  . metoprolol succinate  100 mg Oral Daily  . pantoprazole  40 mg Oral BID  . predniSONE  30 mg Oral Daily  . rosuvastatin  10 mg Oral QPM  . saccharomyces boulardii  250 mg Oral BID  . tacrolimus  1 mg Oral BID  . Warfarin - Pharmacist Dosing Inpatient   Does not apply q1800    OBJECTIVE  Vitals:   11/14/15 0412 11/14/15 0835 11/14/15 1117 11/14/15 1250  BP: (!) 146/48  (!) 142/68 (!) 118/39  Pulse: 61   (!) 107  Resp: 19   18  Temp: 97.5 F (36.4 C)   97.6 F (36.4 C)  TempSrc: Oral   Oral  SpO2: 98% 96%  98%  Weight:      Height:        Intake/Output Summary (Last 24 hours) at 11/14/15 1453 Last data filed at 11/14/15 0644  Gross per 24 hour  Intake               66 ml  Output              450 ml  Net             -384 ml   Filed Weights   11/03/15 2119 11/04/15 2042 11/06/15 0317    Weight: 223 lb 12.3 oz (101.5 kg) 243 lb 9.7 oz (110.5 kg) 242 lb 8.1 oz (110 kg)    PHYSICAL EXAM  General: Pleasant, NAD. Neuro: Alert and oriented X 3. Moves all extremities spontaneously. Psych: Normal affect. HEENT:  Normal  Neck: Supple without bruits or JVD. Lungs:  Resp regular and unlabored, CTA. Heart: RRR no s3, s4, or murmurs. Abdomen: Soft, non-tender, non-distended, BS + x 4.  Extremities: No clubbing, cyanosis. DP/PT/Radials 2+ and equal bilaterally. Diffuse anasarca, 3+ pitting edema throughout  Accessory Clinical Findings  CBC  Recent Labs  11/13/15 1357 11/14/15 0430  WBC 7.7 9.9  HGB 10.2* 10.5*  HCT 30.9* 32.0*  MCV 96.0 95.2  PLT 279 0000000   Basic Metabolic Panel  Recent Labs  11/13/15 0802 11/14/15 0430  NA 136 135  K 5.0 4.4  CL 104 103  CO2 19* 21*  GLUCOSE 253* 209*  BUN 100* 109*  CREATININE 3.63* 4.53*  CALCIUM 8.8* 8.7*   Liver Function Tests  Recent Labs  11/14/15 0430  AST 20  ALT 19  ALKPHOS 116  BILITOT 0.8  PROT 5.9*  ALBUMIN 2.5*    TELE A-fib with HR 70s, longest pause 2.17 sec    ECG  No new EKG  Echocardiogram 10/31/2015  LV EF: 65% -   70%  ------------------------------------------------------------------- Indications:      Pre-op evaluation V728.1.  ------------------------------------------------------------------- History:   PMH:   Atrial fibrillation.  ------------------------------------------------------------------- Study Conclusions  - Left ventricle: The cavity size was normal. There was moderate   concentric hypertrophy. Systolic function was vigorous. The   estimated ejection fraction was in the range of 65% to 70%. Wall   motion was normal; there were no regional wall motion   abnormalities. Features are consistent with a pseudonormal left   ventricular filling pattern, with concomitant abnormal relaxation   and increased filling pressure (grade 2 diastolic dysfunction). - Aortic  valve: Trileaflet; mildly thickened, mildly calcified   leaflets. - Mitral valve: Moderately calcified annulus. - Left atrium: The atrium was mildly dilated. Volume/bsa, ES,   (1-plane Simpson&'s, A2C): 36.1 ml/m^2. - Right atrium: The atrium was moderately dilated. - Pulmonary arteries: Systolic pressure was moderately increased.   PA peak pressure: 53 mm Hg (S).    Radiology/Studies  Dg Chest Port 1 View  Result Date: 11/09/2015 CLINICAL DATA:  Shortness of breath EXAM: PORTABLE CHEST 1 VIEW COMPARISON:  November 06, 2015 FINDINGS: Mild cardiomegaly. The hila and mediastinum are unchanged. Diffuse bilateral interstitial opacities again identified and similar in the interval. The opacity is a little more focal in the left suprahilar region which is increased since November 05, 2015 but unchanged since November 06, 2015. No other acute interval changes. There may be tiny bilateral effusions with blunting of the costophrenic angles. IMPRESSION: 1. Suspected tiny bilateral pleural effusions and diffuse increased interstitial markings are most consistent with pulmonary edema. Atypical viral infection could have a similar appearance. Recommend clinical correlation. *Opacity in the left suprahilar region is a little more focal when compared November 05, 2015. The difference could be due to confluence of shadows. Recommend follow-up to resolution. Electronically Signed   By: Dorise Bullion III M.D   On: 11/09/2015 09:14   Dg Chest Port 1 View  Result Date: 11/06/2015 CLINICAL DATA:  75 year old male with increasing shortness of breath and abnormal left side pulmonary auscultation. Initial encounter. EXAM: PORTABLE CHEST 1 VIEW COMPARISON:  0508 hours today and earlier. FINDINGS: Portable AP semi upright view at 2127 hours. Continued and mildly increased perihilar and basilar predominant interstitial pulmonary opacity. Stable cardiac size and mediastinal contours. No pneumothorax. No pleural effusion or consolidation  identified. IMPRESSION: Increased bilateral pulmonary interstitial opacity. Top differential considerations include progression of interstitial edema versus viral/ atypical respiratory infection. No pleural effusion identified. Electronically Signed   By: Genevie Ann M.D.   On: 11/06/2015 21:42   Dg Chest Port 1 View  Result Date: 11/06/2015 CLINICAL DATA:  75 year old male with aspiration of liquid. EXAM: PORTABLE CHEST 1 VIEW COMPARISON:  Chest radiograph dated 11/05/2015 FINDINGS: Mild cardiomegaly with central vascular congestion. Bibasilar Interstitial prominence and nodular densities, new from prior study may represent atelectatic changes versus infiltrate. There is no pleural effusion or pneumothorax. Top-normal cardiac silhouette. No acute osseous pathology. IMPRESSION: Mild congestive changes with bibasilar subsegmental atelectasis versus infiltrate. Electronically  Signed   By: Anner Crete M.D.   On: 11/06/2015 05:16   Dg Chest Port 1 View  Result Date: 11/05/2015 CLINICAL DATA:  Shortness of breath EXAM: PORTABLE CHEST 1 VIEW COMPARISON:  March 14, 2015 FINDINGS: The lungs are clear. Heart is mildly enlarged with pulmonary vascularity within normal limits. No adenopathy. No bone lesions. IMPRESSION: Mild cardiac enlargement.  No edema or consolidation. Electronically Signed   By: Lowella Grip III M.D.   On: 11/05/2015 09:42   Dg Foot Complete Left  Result Date: 10/28/2015 CLINICAL DATA:  Left big toe pain. EXAM: LEFT FOOT - COMPLETE 3+ VIEW COMPARISON:  None. FINDINGS: There is no evidence of fracture or dislocation. No evidence of dystrophic bony changes. There is soft tissue swelling of the distal first digit. Punctate lucencies within the area of soft tissue swelling may represent entrapped fat or potentially foci of gas. Heavy vascular calcifications are seen. IMPRESSION: No evidence of fracture of the left foot. Soft tissue swelling of the distal first digit with punctate lucencies  within the area of swelling, which may represent entrapped fat or potentially foci of gas. Please correlate clinically regarding the possibility of cellulitis. Electronically Signed   By: Fidela Salisbury M.D.   On: 10/28/2015 21:01   ASSESSMENT AND PLAN  1. Chronic Atrial Fibrillation - This patients CHA2DS2-VASc Score and unadjusted Ischemic Stroke Rate (% per year) is equal to 9.7 % stroke rate/year from a score of 6 (CHF, HTN, DM, Vascular, Age (2)). Heparin stopped, transitioning back to Coumadin. INR supratherapeutic this AM at 3.65. Pharmacy following.  - continue BB for rate-control. He is having pauses up to 2.17 seconds. Digoxin stopped this admission. HR currently well rate controlled.    2. Chronic Diastolic CHF - echo this admission showing preserved EF of 65-70% with Grade 2 DD.  - despite weight gain, aggressive diuresis has not removed much fluid.  - starting dialysis today.   3. HTN - BP has been 108/52 - 130/90 in the past 24 hours. - continue current medication regimen.   4. Acute on Stage 4 CKD - s/p renal transplant in 2005, baseline creatinine 1.8 - 2.0.  - Nephrology following. On higher dose of steroid for any possible rejection.   - renal function continue to worsen.  5. Gangrene of L great toe - s/p amputation on 8/2 - per Vascular Surgery.    Larae Grooms

## 2015-11-14 NOTE — Progress Notes (Signed)
Patient ID: Mark [name], male   DOB: 17-Aug-1940, 75 y.o.   MRN: [id number]                                                                PROGRESS NOTE                                                                                                                                                                                                             Patient Demographics:    Mark [name], is a 75 y.o. male, DOB - 1940-07-08, [id number]  Admit date - 10/28/2015   Admitting Physician Etta Quill, DO  Outpatient Primary MD for the patient is Rochel Brome, MD  LOS - 17  Outpatient Specialists:   Chief Complaint  Patient presents with  . 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 Patient presents to ED with c/o 'his toe is dead'. Creatinine baseline appears to be 1.8-2.3.  Primary nephrologist is Dr. Justin Mend.  Saw him on 06/26/2015 with BUN 27, creatinine 1.88.  Admitted on 10/28/2015 with necrotic left great toe with cellulitis.  Creatinine on admission was 2.6.  Seen by [name] in consult on 7/25.  His Prograf level was found to be low and was increased.  Patient underwent abdominal aortogram with angioplasty of the left anterior tibial artery and left popliteal artery on 11/05/2015.  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  He was on Vancomycin and Zosyn from 7/24-8/3.  Currently only on Zosyn.  He developed some dyspnea in the setting of being positive about 13 liters since admission with evidence of volume overload on exam. Worsening renal failure. Placement of temporary hemodialysis catheter on 8/10. Nephrology planning to start patient on dialysis   Subjective:    Mark [name]  depressed, decreased appetite, generalized anasarca   Assessment  & Plan :    Principal Problem:   Gangrene of toe ([place]) Active Problems:  Type II diabetes mellitus, uncontrolled ([place])   Renal transplant recipient   Diabetic ulcer of left great toe ([place])   Cellulitis of great toe of left foot   Aspiration of liquid   SOB (shortness  of breath)   Permanent atrial fibrillation (HCC)   Bibasilar crackles   CHF (congestive heart failure) (HCC)   Melena   Acute on chronic diastolic heart failure Unable to diurese with the help of diuretics cardiology and nephrology managing diuretics Chest x-ray shows mild congestive changes,      Renal function worsening  2-D echo did not show any wall motion abnormalities and the patient does not appear to have any ischemic changes on his EKG Renal allograft function worse today with Creatinine 4.53  Refractory volume overload with slowly rising creat.   continue Lasix 160mg  Q6H. - continue metolazone 10mg  daily. - continue diuretics until patient able to go for HD. - IR order placed for tunneled HD catheter. - upper extremity vein mapping ordered.    Gangrene of left  Toe/Toe Amputation:  S /P atherectomy and DCB Left popliteal artery and PTA left ATA (POD 5) S/P Ray amputation of left great toe (POD 4) Palpable Left popliteal pulse Toe amp site looks ok. Continue dressing changes   VVS following .ABI shows no blood flow to the left toe,  Situation complicated by renal transplant with elevated creatinine, Coumadin resumed post op Received clearance from cardiology according to [facility] and AHA guidelines   no further cardiac workup was done prior to his noncardiac surgery  Wound culture shows gram-positive cocci in clusters, however blood culture has been negative so far, if margins are clear doubt  patient needs to continue with antibiotics at this time especially in the setting of diarrhea Vanco random  level 8/5 of 19, and no dose of vancomycin since 8/2 PM. Off all abx   Anemia of chronic ds, FOBT positive,INR >3.0, check stat cbc and follow .Hg has slowly trended down from  14.2>10.6, but stable around 10.5 for several days. Continue PPI twice a day  Diarrhea-C. difficile negative 2 during this admission, improved, continue probiotic   Acute on Chronic kidney disease stage IV, status post renal transplant , baseline creatinine around 1.7, now 3. Patient is status post kidney transplant in 2005 at [facility] Nephrology consulted to monitor patient's renal function in the setting of   procedure/angiogram.Continue anti-rejection meds, repeat Tacrolimus level is 7.3. Timing of increased creatinine coincides with aortogram on August 1.  , currently creatinine is trending up, nephrology following,may have to consider dialysis later this week - continue diuretics until patient able to go for HD. - IR order placed for tunneled HD catheter. - upper extremity vein mapping ordered.   A fib with with aberrancy - resumed coumadin, heparin per pharmacy, rate controlled , preop 2-D echo shows grade 2 diastolic dysfunction, pulmonary hypertension, EKG/telemetry shows A. fib rate controlled, continue metoprolol, Cardiology consulted for preop clearance ,  He is having some aberrancy on telemetry. Continue beta blocker for rate control. Digoxin DCed. Continue heparin; resumed coumadin per  vascular surgery   DM2 -continue  Lantus at 25  units CBG uncontrolled ,  , contpre-meal NovoLog 5 units.Moderate dose mealtime and SSI AC/HS,     Hemoglobin A1c 8.2  Hypertension: uncontrolled Continue norvasc to 10mg  po qday , metoprolol Prn hydralazine  Hypothyroidism-continue Synthroid  Protein Calorie Malnutrition Prostat, albumin 2.1, nutrition consult   Dyslipidemia-continue Crestor  Pulmonary hypertension:  Mr. Flaugher has moderate pulmonary hypertension.   cardiology Recommend outpatient sleep study and PFTs.  This may also be due to left-sided heart disease. He does have grade 2 diastolic dysfunction. Recommend diuresis as per nephrology.  There is no evidence of liver  disease by laboratory testing   Code Status : FULL CODE  Family Communication  :   Disposition Plan  :  ? Will likely need SNF  Barriers For Discharge : renal function, and the patient on dialysis  Consults  :  Vascular surgery, nephrology, cardiology  Procedures  : Atherectomy and drug coated balloon angioplasty 8/1 , toe amputation 8/2  DVT Prophylaxis  :  Heparin     Lab Results  Component Value Date   PLT 296 11/14/2015    Antibiotics  :     10/28/15 2200  piperacillin-tazobactam (ZOSYN) IVPB 3.375 g  Status:  Discontinued     3.375 g 12.5 mL/hr over 240 Minutes Intravenous Every 8 hours 10/28/15 2120 11/09/15 1057   10/28/15 2200  vancomycin (VANCOCIN) 2,500 mg in sodium chloride 0.9 % 500 mL IVPB     2,500 mg 250 mL/hr over 120 Minutes Intravenous  Once 10/28/15 2154 10/29/15 2330        Objective:   Vitals:   11/13/15 1931 11/14/15 0412 11/14/15 0835 11/14/15 1117  BP: (!) 122/51 (!) 146/48  (!) 142/68  Pulse: 72 61    Resp: 18 19    Temp: 97.4 F (36.3 C) 97.5 F (36.4 C)    TempSrc: Oral Oral    SpO2: 96% 98% 96%   Weight:      Height:        Wt Readings from Last 3 Encounters:  11/06/15 110 kg (242 lb 8.1 oz)  09/05/15 103.1 kg (227 lb 3.2 oz)  08/05/15 101.8 kg (224 lb 6.4 oz)     Intake/Output Summary (Last 24 hours) at 11/14/15 1209 Last data filed at 11/14/15 0644  Gross per 24 hour  Intake               66 ml  Output              450 ml  Net             -384 ml     Physical Exam somnolent, No new F.N deficits, Normal affect [place].AT,PERRAL Supple Neck,No JVD, No cervical lymphadenopathy appriciated.  Symmetrical Chest wall movement, Good air movement bilaterally, CTAB RRR,No Gallops,Rubs or new Murmurs, No Parasternal Heave +ve B.Sounds, Abd Soft, No tenderness, No organomegaly appriciated, No rebound - guarding or rigidity. No edema.  Diffuse anasarca, 3+ pitting edema throughout   Data Review:    CBC  Recent Labs Lab  11/09/15 0256 11/10/15 0313 11/11/15 0314 11/13/15 1357 11/14/15 0430  WBC 8.8 9.2 8.6 7.7 9.9  HGB 11.6* 10.8* 10.6* 10.2* 10.5*  HCT 35.9* 33.3* 32.9* 30.9* 32.0*  PLT 282 294 284 279 296  MCV 94.5 94.1 94.3 96.0 95.2  MCH 30.5 30.5 30.4 31.7 31.3  MCHC 32.3 32.4 32.2 33.0 32.8  RDW 15.7* 15.7* 16.3* 16.4* 16.6*    Chemistries   Recent Labs Lab 11/08/15 1055 11/09/15 0256 11/10/15 0313 11/11/15 0314 11/12/15 0350 11/13/15 0802 11/14/15 0430  NA 139 137 138 138 135 136 135  K 4.2 4.5 4.8 4.6 5.6* 5.0 4.4  CL 109 109 110 109 107 104 103  CO2 21* 20* 21* 21* 18* 19* 21*  GLUCOSE 130* 147* 225* 163* 152* 253* 209*  BUN 37* 45* 68* 72* 87* 100* 109*  CREATININE 2.52* 2.78* 2.98* 3.06* 3.28* 3.63* 4.53*  CALCIUM 8.7* 8.6* 8.6* 8.8* 8.8* 8.8* 8.7*  AST 21 18 16 17   --   --  20  ALT 16*  15* 15* 16*  --   --  19  ALKPHOS 93 98 93 101  --   --  116  BILITOT 0.9 1.1 0.6 0.8  --   --  0.8   ------------------------------------------------------------------------------------------------------------------ No results for input(s): CHOL, HDL, LDLCALC, TRIG, CHOLHDL, LDLDIRECT in the last 72 hours.  Lab Results  Component Value Date   HGBA1C 8.2 (H) 10/29/2015   ------------------------------------------------------------------------------------------------------------------ No results for input(s): TSH, T4TOTAL, T3FREE, THYROIDAB in the last 72 hours.  Invalid input(s): FREET3 ------------------------------------------------------------------------------------------------------------------ No results for input(s): VITAMINB12, FOLATE, FERRITIN, TIBC, IRON, RETICCTPCT in the last 72 hours.  Coagulation profile  Recent Labs Lab 11/10/15 0313 11/11/15 0314 11/12/15 0350 11/13/15 0802 11/14/15 0430  INR 3.10 3.83 3.65 3.37 3.38    No results for input(s): DDIMER in the last 72 hours.  Cardiac Enzymes No results for input(s): CKMB, TROPONINI, MYOGLOBIN in the last 168  hours.  Invalid input(s): CK ------------------------------------------------------------------------------------------------------------------ No results found for: BNP  Inpatient Medications  Scheduled Meds: . allopurinol  150 mg Oral Daily  . amLODipine  10 mg Oral Daily  . aspirin  81 mg Oral Daily  . calcitRIOL  0.5 mcg Oral Daily  . docusate sodium  100 mg Oral Daily  . famotidine  20 mg Oral QHS  . feeding supplement  1 Container Oral TID WC  . furosemide  160 mg Intravenous Q6H  . hydrALAZINE  25 mg Oral Q8H  . insulin aspart  0-15 Units Subcutaneous TID WC  . insulin aspart  5 Units Subcutaneous TID WC  . insulin glargine  20 Units Subcutaneous QHS  . levothyroxine  137 mcg Oral QAC breakfast  . magnesium oxide  400 mg Oral BID  . metolazone  10 mg Oral Daily  . metoprolol succinate  100 mg Oral Daily  . pantoprazole  40 mg Oral BID  . predniSONE  30 mg Oral Daily  . rosuvastatin  10 mg Oral QPM  . saccharomyces boulardii  250 mg Oral BID  . tacrolimus  1 mg Oral BID  . Warfarin - Pharmacist Dosing Inpatient   Does not apply q1800   Continuous Infusions:   PRN Meds:.alum & mag hydroxide-simeth, guaiFENesin-dextromethorphan, hydrALAZINE, HYDROcodone-acetaminophen, labetalol, levalbuterol, metoprolol, ondansetron (ZOFRAN) IV, phenol  Micro Results Recent Results (from the past 240 hour(s))  Surgical PCR screen     Status: Abnormal   Collection Time: 11/05/15 11:18 PM  Result Value Ref Range Status   MRSA, PCR NEGATIVE NEGATIVE Final   Staphylococcus aureus POSITIVE (A) NEGATIVE Final    Comment:        The Xpert SA Assay (FDA approved for NASAL specimens in patients over 12 years of age), is one component of a comprehensive surveillance program.  Test performance has been validated by [facility] for patients greater than or equal to 16 year old. It is not intended to diagnose infection nor to guide or monitor treatment.   Fungus Culture With Stain (Not  @ [facility])     Status: None (Preliminary result)   Collection Time: 11/06/15  2:33 PM  Result Value Ref Range Status   Fungus Stain Final report  Final    Comment: (NOTE) Performed At: [facility] [place], [place] [id number] Lindon Romp MD [id number]    Fungus (Mycology) Culture PENDING  Incomplete   Fungal Source WOUND  Final    Comment: LEFT TOE POF VANC AND ZOSYN   Acid Fast Smear (AFB)     Status: None  Collection Time: 11/06/15  2:33 PM  Result Value Ref Range Status   AFB Specimen Processing Concentration  Final   Acid Fast Smear Negative  Final    Comment: (NOTE) Performed At: [facility] [place], [place] [id number] Lindon Romp MD [id number]    Source (AFB) WOUND  Final    Comment: LEFT TOE POF VANC AND ZOSYN   Aerobic Culture (superficial specimen)     Status: None   Collection Time: 11/06/15  2:33 PM  Result Value Ref Range Status   Specimen Description WOUND LEFT TOE  Final   Special Requests GREAT TOE POF VANC AND ZOSYN  Final   Gram Stain   Final    NO WBC SEEN FEW SQUAMOUS EPITHELIAL CELLS PRESENT FEW GRAM POSITIVE COCCI IN CLUSTERS    Culture MULTIPLE ORGANISMS PRESENT, NONE PREDOMINANT  Final   Report Status 11/08/2015 FINAL  Final  Fungus Culture Result     Status: None   Collection Time: 11/06/15  2:33 PM  Result Value Ref Range Status   Result 1 Comment  Final    Comment: (NOTE) KOH/Calcofluor preparation:  no fungus observed. Performed At: [facility] [place], [place] [id number] Lindon Romp MD [id number]   C difficile quick scan w PCR reflex     Status: None   Collection Time: 11/09/15  4:08 PM  Result Value Ref Range Status   C Diff antigen NEGATIVE NEGATIVE Final   C Diff toxin NEGATIVE NEGATIVE Final   C Diff interpretation No C. difficile detected.  Final    Radiology Reports Dg Chest Port 1 View  Result Date: 11/09/2015 CLINICAL DATA:   Shortness of breath EXAM: PORTABLE CHEST 1 VIEW COMPARISON:  November 06, 2015 FINDINGS: Mild cardiomegaly. The hila and mediastinum are unchanged. Diffuse bilateral interstitial opacities again identified and similar in the interval. The opacity is a little more focal in the left suprahilar region which is increased since November 05, 2015 but unchanged since November 06, 2015. No other acute interval changes. There may be tiny bilateral effusions with blunting of the costophrenic angles. IMPRESSION: 1. Suspected tiny bilateral pleural effusions and diffuse increased interstitial markings are most consistent with pulmonary edema. Atypical viral infection could have a similar appearance. Recommend clinical correlation. *Opacity in the left suprahilar region is a little more focal when compared November 05, 2015. The difference could be due to confluence of shadows. Recommend follow-up to resolution. Electronically Signed   By: Dorise Bullion III M.D   On: 11/09/2015 09:14   Dg Chest Port 1 View  Result Date: 11/06/2015 CLINICAL DATA:  75 year old male with increasing shortness of breath and abnormal left side pulmonary auscultation. Initial encounter. EXAM: PORTABLE CHEST 1 VIEW COMPARISON:  0508 hours today and earlier. FINDINGS: Portable AP semi upright view at 2127 hours. Continued and mildly increased perihilar and basilar predominant interstitial pulmonary opacity. Stable cardiac size and mediastinal contours. No pneumothorax. No pleural effusion or consolidation identified. IMPRESSION: Increased bilateral pulmonary interstitial opacity. Top differential considerations include progression of interstitial edema versus viral/ atypical respiratory infection. No pleural effusion identified. Electronically Signed   By: Genevie Ann M.D.   On: 11/06/2015 21:42   Dg Chest Port 1 View  Result Date: 11/06/2015 CLINICAL DATA:  75 year old male with aspiration of liquid. EXAM: PORTABLE CHEST 1 VIEW COMPARISON:  Chest radiograph  dated 11/05/2015 FINDINGS: Mild cardiomegaly with central vascular congestion. Bibasilar Interstitial prominence and nodular densities, new from prior study may represent  atelectatic changes versus infiltrate. There is no pleural effusion or pneumothorax. Top-normal cardiac silhouette. No acute osseous pathology. IMPRESSION: Mild congestive changes with bibasilar subsegmental atelectasis versus infiltrate. Electronically Signed   By: Anner Crete M.D.   On: 11/06/2015 05:16   Dg Chest Port 1 View  Result Date: 11/05/2015 CLINICAL DATA:  Shortness of breath EXAM: PORTABLE CHEST 1 VIEW COMPARISON:  March 14, 2015 FINDINGS: The lungs are clear. Heart is mildly enlarged with pulmonary vascularity within normal limits. No adenopathy. No bone lesions. IMPRESSION: Mild cardiac enlargement.  No edema or consolidation. Electronically Signed   By: Lowella Grip III M.D.   On: 11/05/2015 09:42   Dg Foot Complete Left  Result Date: 10/28/2015 CLINICAL DATA:  Left big toe pain. EXAM: LEFT FOOT - COMPLETE 3+ VIEW COMPARISON:  None. FINDINGS: There is no evidence of fracture or dislocation. No evidence of dystrophic bony changes. There is soft tissue swelling of the distal first digit. Punctate lucencies within the area of soft tissue swelling may represent entrapped fat or potentially foci of gas. Heavy vascular calcifications are seen. IMPRESSION: No evidence of fracture of the left foot. Soft tissue swelling of the distal first digit with punctate lucencies within the area of swelling, which may represent entrapped fat or potentially foci of gas. Please correlate clinically regarding the possibility of cellulitis. Electronically Signed   By: Fidela Salisbury M.D.   On: 10/28/2015 21:01   Time Spent in minutes  8   Schylar Allard M.D on 11/14/2015 at 12:09 PM  Between 7am to 7pm - Pager - [phone]  After 7pm go to [email or web address] - password [email or web address]  [facility] -  Office  [phone]

## 2015-11-14 NOTE — Progress Notes (Signed)
[facility] Rounding Note  Subjective: Patient seen and examined this morning. Feels pretty much like he did yesterday.   Having generalized malaise and decreased appetite. Understanding of HD need.  Objective: BP (!) 146/48 (BP Location: Left Arm)   Pulse 61   Temp 97.5 F (36.4 C) (Oral)   Resp 19   Ht 5\' 9"  (1.753 m)   Wt 242 lb 8.1 oz (110 kg)   SpO2 98%   BMI 35.81 kg/m    Intake/Output Summary (Last 24 hours) at 11/14/15 0801 Last data filed at 11/14/15 0644  Gross per 24 hour  Intake               66 ml  Output              450 ml  Net             -384 ml    Weight change:   EXAM: General: lying in bed, on supplemental oxygen, no acute distress HEENT: [place]/AT, EOMI, no scleral icterus Pulm: diminished breath sounds, normal effort, no wheezes or crackles Abd: soft, mild tenderness at area of kidney on Left unchanged, protuberant Ext: warm and well perfused, 2+ lower extremity edema bilaterally. Upper extremity edema present.  Left foot wrapped in clean, dry Ace bandage. Neuro: alert and oriented X3, cranial nerves II-XII grossly intact  Labs:  Recent Labs Lab 11/12/15 0350 11/13/15 0802 11/14/15 0430  NA 135 136 135  K 5.6* 5.0 4.4  CL 107 104 103  CO2 18* 19* 21*  GLUCOSE 152* 253* 209*  BUN 87* 100* 109*  CREATININE 3.28* 3.63* 4.53*  CALCIUM 8.8* 8.8* 8.7*      Recent Labs Lab 11/10/15 0313 11/11/15 0314 11/14/15 0430  AST 16 17 20   ALT 15* 16* 19  ALKPHOS 93 101 116  BILITOT 0.6 0.8 0.8  PROT 5.5* 5.3* 5.9*  ALBUMIN 2.0* 2.1* 2.5*     Recent Labs Lab 11/09/15 0256 11/10/15 0313 11/11/15 0314 11/13/15 1357 11/14/15 0430  WBC 8.8 9.2 8.6 7.7 9.9  HGB 11.6* 10.8* 10.6* 10.2* 10.5*  HCT 35.9* 33.3* 32.9* 30.9* 32.0*  MCV 94.5 94.1 94.3 96.0 95.2  PLT 282 294 284 279 296    No results for input(s): CKTOTAL, CKMB, CKMBINDEX, TROPONINI in the last 168 hours.    Recent Labs Lab 11/13/15 0556 11/13/15 1126 11/13/15 1637  11/13/15 2112 11/14/15 0604  GLUCAP 280* 285* 275* 285* 183*    ABG    Component Value Date/Time   PHART 7.408 11/07/2015 0235   PCO2ART 30.9 (L) 11/07/2015 0235   PO2ART 71.1 (L) 11/07/2015 0235   HCO3 19.1 (L) 11/07/2015 0235   TCO2 20.1 11/07/2015 0235   ACIDBASEDEF 4.7 (H) 11/07/2015 0235   O2SAT 94.4 11/07/2015 0235   TAC trough level from 8/5 pending  Studies/Results: No results found. Scheduled Meds: . allopurinol  150 mg Oral Daily  . amLODipine  10 mg Oral Daily  . aspirin  81 mg Oral Daily  . calcitRIOL  0.5 mcg Oral Daily  . docusate sodium  100 mg Oral Daily  . famotidine  20 mg Oral QHS  . feeding supplement  1 Container Oral TID WC  . furosemide  160 mg Intravenous Q6H  . hydrALAZINE  25 mg Oral Q8H  . insulin aspart  0-15 Units Subcutaneous TID WC  . insulin aspart  5 Units Subcutaneous TID WC  . insulin glargine  20 Units Subcutaneous QHS  . levothyroxine  137 mcg  Oral QAC breakfast  . magnesium oxide  400 mg Oral BID  . metolazone  10 mg Oral Daily  . metoprolol succinate  100 mg Oral Daily  . pantoprazole  40 mg Oral BID  . predniSONE  30 mg Oral Daily  . rosuvastatin  10 mg Oral QPM  . saccharomyces boulardii  250 mg Oral BID  . tacrolimus  1 mg Oral BID  . Warfarin - Pharmacist Dosing Inpatient   Does not apply q1800   PRN Meds alum & mag hydroxide-simeth, guaiFENesin-dextromethorphan, hydrALAZINE, HYDROcodone-acetaminophen, labetalol, levalbuterol, metoprolol, ondansetron (ZOFRAN) IV, phenol  Infusion Meds   Background: 75 y.o. male.  He has PMHx of CKD V due to diabetes mellitus type II, s/p renal transplant in 2005 at [facility].  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  Creatine baseline appears to be 1.8-2.0.  Primary nephrologist is Dr. Justin Mend. Saw him on 06/26/2015 with BUN 27, creatinine 1.88.  Admitted on 10/28/2015 with necrotic left great toe with cellulitis. Creatinine on admission was 2.6.  Seen by [name] in consult  on 7/25.  Creatinine had been improving until last couple days when started to trend back up post aortogram and in setting of being up 13 liters since admission.  Assessment/Plan:  Renal transplant (2005)  with AKI on CKD4 post contrast administration - Baseline creatinine 1.88-2 Timing of increased creatinine coincides with aortogram on August 1, however, atheroembolic event remains possible as well given his stuttering creatinine increases.  It appears he received 60cc of contrast during procedure which is a moderate contrast load given his GFR Renal allograft function worse today with Creatinine 4.53 from 3.63.   - Unimpressive diuresis with only 450cc out yesterday - Potassium this morning 4.4 - Steroids dose at 30mg  daily for any component of rejection. - On Prograf - continue Lasix 160mg  Q6H. - continue metolazone 10mg  daily. - continue diuretics until patient able to go for HD. - IR order placed for tunneled HD catheter. - upper extremity vein mapping ordered.  CKD-MBD  On calcitriol 0.40mcg daily, phosphorous 250mg  BID PTH on 7/25 was 82  HTN/Volume:   BP has been 122/48 to 146/51 past 24 hours.   Still volume overloaded.   Hydralazine TID in addition to amlodipine and Lasix.   Cardiology following.   Continuing current medications  DM Per primary service  AFib:  BB for rate control, on heparin, coumadin per VVS  Gangrene/Cellulitis/Toe Amputation:   S/p atherectomy and DCB L popliteal art, and PTA left anterior tibial artery 11/05/2015.   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   VVS following.  HLD  Hypothyroidism   Jule Ser  8:01 AM    Renal Attending: Refractory volume overload and worsening renal fct.  We will plan hemodialysis today.  Our hope is for recovery of some fct and that contrast nephropathy may be problematic.  Less likely is poss of acute rejection, but on high dose steroids. Carol Loftin C

## 2015-11-14 NOTE — Progress Notes (Signed)
Patient ID: Mark [name], male   DOB: 08-Apr-1940, 75 y.o.   MRN: [id number]   Worsening renal function Cr 4.53 this am request for tunneled HD catheter placement  INR 3.38 today Cannot safely place tunneled catheter   Discussed with Dr Juleen China and Dr Florene Glen Can offer Temp cath---they are agreeable

## 2015-11-15 ENCOUNTER — Encounter (HOSPITAL_COMMUNITY): Payer: Medicare Other

## 2015-11-15 LAB — HEPATITIS B SURFACE ANTIBODY,QUALITATIVE: Hep B S Ab: NONREACTIVE

## 2015-11-15 LAB — RENAL FUNCTION PANEL
Albumin: 2.5 g/dL — ABNORMAL LOW (ref 3.5–5.0)
Albumin: 2.4 g/dL — ABNORMAL LOW (ref 3.5–5.0)
Anion gap: 11 (ref 5–15)
Anion gap: 10 (ref 5–15)
BUN: 72 mg/dL — ABNORMAL HIGH (ref 6–20)
BUN: 72 mg/dL — AB (ref 6–20)
CALCIUM: 8 mg/dL — AB (ref 8.9–10.3)
CHLORIDE: 99 mmol/L — AB (ref 101–111)
CO2: 24 mmol/L (ref 22–32)
CO2: 24 mmol/L (ref 22–32)
CREATININE: 4.16 mg/dL — AB (ref 0.61–1.24)
CREATININE: 4.09 mg/dL — AB (ref 0.61–1.24)
Calcium: 8.2 mg/dL — ABNORMAL LOW (ref 8.9–10.3)
Chloride: 99 mmol/L — ABNORMAL LOW (ref 101–111)
GFR calc non Af Amer: 13 mL/min — ABNORMAL LOW (ref >60)
GFR, EST AFRICAN AMERICAN: 15 mL/min — AB (ref >60)
GFR, EST AFRICAN AMERICAN: 15 mL/min — AB (ref >60)
GFR, EST NON AFRICAN AMERICAN: 13 mL/min — AB (ref >60)
GLUCOSE: 140 mg/dL — AB (ref 65–99)
Glucose, Bld: 143 mg/dL — ABNORMAL HIGH (ref 65–99)
PHOSPHORUS: 5.7 mg/dL — AB (ref 2.5–4.6)
POTASSIUM: 4.2 mmol/L (ref 3.5–5.1)
Phosphorus: 5.6 mg/dL — ABNORMAL HIGH (ref 2.5–4.6)
Potassium: 4.1 mmol/L (ref 3.5–5.1)
SODIUM: 133 mmol/L — AB (ref 135–145)
Sodium: 134 mmol/L — ABNORMAL LOW (ref 135–145)

## 2015-11-15 LAB — HEPATITIS B CORE ANTIBODY, TOTAL: Hep B Core Total Ab: NEGATIVE

## 2015-11-15 LAB — GLUCOSE, CAPILLARY
GLUCOSE-CAPILLARY: 179 mg/dL — AB (ref 65–99)
GLUCOSE-CAPILLARY: 131 mg/dL — AB (ref 65–99)
Glucose-Capillary: 159 mg/dL — ABNORMAL HIGH (ref 65–99)
Glucose-Capillary: 152 mg/dL — ABNORMAL HIGH (ref 65–99)

## 2015-11-15 LAB — HEPATITIS B SURFACE ANTIGEN: HEP B S AG: NEGATIVE

## 2015-11-15 LAB — PROTIME-INR
INR: 3.14
PROTHROMBIN TIME: 33 s — AB (ref 11.4–15.2)

## 2015-11-15 LAB — CBC
HCT: 29.9 % — ABNORMAL LOW (ref 39.0–52.0)
Hemoglobin: 10.1 g/dL — ABNORMAL LOW (ref 13.0–17.0)
MCH: 32.4 pg (ref 26.0–34.0)
MCHC: 33.8 g/dL (ref 30.0–36.0)
MCV: 95.8 fL (ref 78.0–100.0)
PLATELETS: 267 10*3/uL (ref 150–400)
RBC: 3.12 MIL/uL — AB (ref 4.22–5.81)
RDW: 16.9 % — AB (ref 11.5–15.5)
WBC: 8.2 10*3/uL (ref 4.0–10.5)

## 2015-11-15 MED ORDER — PREDNISONE 5 MG PO TABS
5.0000 mg | ORAL_TABLET | Freq: Every day | ORAL | Status: DC
  Administered 2015-11-16 – 2015-11-26 (×9): 5 mg via ORAL
  Filled 2015-11-15 (×10): qty 1

## 2015-11-15 NOTE — Progress Notes (Signed)
ANTICOAGULATION CONSULT NOTE - Follow Up Consult  Pharmacy Consult for Heparin/Coumadin Indication: afib   Allergies  Allergen Reactions  . Tape Other (See Comments)    SKIN WILL TEAR!!    Patient Measurements: Height: 5\' 9"  (175.3 cm) Weight: 238 lb 1.6 oz (108 kg) (Bed scale) IBW/kg (Calculated) : 70.7  Vital Signs: Temp: 97.7 F (36.5 C) (08/11 0545) Temp Source: Oral (08/11 0545) BP: 101/63 (08/11 1034) Pulse Rate: 59 (08/11 1034)  Labs:  Recent Labs  11/13/15 0802  11/13/15 1357 11/14/15 0430 11/14/15 1817 11/14/15 1845 11/15/15 0303  HGB  --   < > 10.2* 10.5*  --  10.7*  --   HCT  --   --  30.9* 32.0*  --  32.1*  --   PLT  --   --  279 296  --  304  --   LABPROT 34.8*  --   --  35.0*  --   --  33.0*  INR 3.37  --   --  3.38  --   --  3.14  CREATININE 3.63*  --   --  4.53* 4.95*  --   --   < > = values in this interval not displayed.  Estimated Creatinine Clearance: 15.6 mL/min (by C-G formula based on SCr of 4.95 mg/dL).   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 PTA warfarin restarted 8/4. INR currently supratherapeutic at 3.14.  **Warfarin PTA dosing: 5mg  daily except 2.5mg  on Tuesdays and Thursdays.    Goal of Therapy:  INR 2-3 Monitor platelets by anticoagulation protocol: Yes    Plan:  - Hold warfarin again tonight - Daily INR  Cassie L. Nicole Kindred, PharmD Clinical Pharmacist Pager: [phone] 11/15/2015 11:03 AM

## 2015-11-15 NOTE — Progress Notes (Addendum)
[facility] Rounding Note  Subjective: Patient seen and examined this morning. No new complaints. Tolerated HD well yesterday with 2 liters removed, going back again today for more volume removal. Breathing better.  Objective: BP (!) 113/43   Pulse (!) 58   Temp 97.7 F (36.5 C) (Oral)   Resp 16   Ht 5\' 9"  (1.753 m)   Wt 238 lb 1.6 oz (108 kg) Comment: Bed scale  SpO2 94%   BMI 35.16 kg/m    Intake/Output Summary (Last 24 hours) at 11/15/15 0754 Last data filed at 11/14/15 2152  Gross per 24 hour  Intake              480 ml  Output             2000 ml  Net            -1520 ml    Weight change:   EXAM: General: sleeping in bed, easily aroused, on supplemental oxygen, no acute distress HEENT: [place]/AT, EOMI, no scleral icterus Neck: Right IJ temp catheter Pulm: diminished breath sounds, normal effort, no wheezes or crackles Abd: soft, mild tenderness at area of kidney on Left unchanged, protuberant Ext: warm and well perfused, 2+ lower extremity edema bilaterally. Left foot wrapped in clean, dry Ace bandage. Neuro: alert and oriented X3, cranial nerves II-XII grossly intact  Labs:  Recent Labs Lab 11/13/15 0802 11/14/15 0430 11/14/15 1817  NA 136 135 132*  K 5.0 4.4 4.8  CL 104 103 102  CO2 19* 21* 20*  GLUCOSE 253* 209* 210*  BUN 100* 109* 116*  CREATININE 3.63* 4.53* 4.95*  CALCIUM 8.8* 8.7* 8.6*  PHOS  --   --  7.0*      Recent Labs Lab 11/10/15 0313 11/11/15 0314 11/14/15 0430 11/14/15 1817  AST 16 17 20   --   ALT 15* 16* 19  --   ALKPHOS 93 101 116  --   BILITOT 0.6 0.8 0.8  --   PROT 5.5* 5.3* 5.9*  --   ALBUMIN 2.0* 2.1* 2.5* 2.6*     Recent Labs Lab 11/10/15 0313 11/11/15 0314 11/13/15 1357 11/14/15 0430 11/14/15 1845  WBC 9.2 8.6 7.7 9.9 7.7  HGB 10.8* 10.6* 10.2* 10.5* 10.7*  HCT 33.3* 32.9* 30.9* 32.0* 32.1*  MCV 94.1 94.3 96.0 95.2 94.7  PLT 294 284 279 296 304    No results for input(s): CKTOTAL, CKMB, CKMBINDEX, TROPONINI in  the last 168 hours.    Recent Labs Lab 11/14/15 0604 11/14/15 1117 11/14/15 1746 11/14/15 2236 11/15/15 0646  GLUCAP 183* 176* 217* 149* 179*    ABG    Component Value Date/Time   PHART 7.408 11/07/2015 0235   PCO2ART 30.9 (L) 11/07/2015 0235   PO2ART 71.1 (L) 11/07/2015 0235   HCO3 19.1 (L) 11/07/2015 0235   TCO2 20.1 11/07/2015 0235   ACIDBASEDEF 4.7 (H) 11/07/2015 0235   O2SAT 94.4 11/07/2015 0235   TAC trough level from 8/5 pending  Studies/Results: Ir Fluoro Guide Cv Line Right  Result Date: 11/14/2015 INDICATION: Acute kidney injury and needs hemodialysis. Plan for placement of non tunneled catheter due to elevated INR level. EXAM: FLUOROSCOPIC AND ULTRASOUND GUIDED PLACEMENT OF A NON TUNNELED DIALYSIS CATHETER Physician: Stephan Minister. Henn, MD MEDICATIONS: None ANESTHESIA/SEDATION: None FLUOROSCOPY TIME:  Fluoroscopy Time: 24 seconds COMPLICATIONS: None immediate. PROCEDURE: Informed consent was obtained for placement of a non tunneled dialysis catheter. The patient was placed supine on the interventional table. Ultrasound confirmed a patent right  internal jugular vein. Ultrasound images were obtained for documentation. The right side of the neck was prepped and draped in a sterile fashion. The right neck was anesthetized with 1% lidocaine. Maximal barrier sterile technique was utilized including caps, mask, sterile gowns, sterile gloves, sterile drape, hand hygiene and skin antiseptic. A small incision was made with #11 blade scalpel. A 21 gauge needle directed into the right internal jugular vein with ultrasound guidance. A micropuncture dilator set was placed. 20 cm Mahurkar catheter was selected. J wire was advanced into the IVC. Tract was dilated to accommodate the catheter. Catheter tip placed near the superior cavoatrial junction. All the lumens aspirated and flushed well. Appropriate amount of heparin was placed in the dialysis lumens. Catheter was sutured to the skin with  Prolene suture. Fluoroscopic and ultrasound images were taken and saved for documentation. FINDINGS: Patent right internal jugular vein. Catheter tip near the superior cavoatrial junction. IMPRESSION: Successful placement of a non tunneled dialysis catheter using ultrasound and fluoroscopic guidance. Electronically Signed   By: Markus Daft M.D.   On: 11/14/2015 19:52   Ir US Guide Vasc Access Right  Result Date: 11/14/2015 INDICATION: Acute kidney injury and needs hemodialysis. Plan for placement of non tunneled catheter due to elevated INR level. EXAM: FLUOROSCOPIC AND ULTRASOUND GUIDED PLACEMENT OF A NON TUNNELED DIALYSIS CATHETER Physician: Stephan Minister. Henn, MD MEDICATIONS: None ANESTHESIA/SEDATION: None FLUOROSCOPY TIME:  Fluoroscopy Time: 24 seconds COMPLICATIONS: None immediate. PROCEDURE: Informed consent was obtained for placement of a non tunneled dialysis catheter. The patient was placed supine on the interventional table. Ultrasound confirmed a patent right internal jugular vein. Ultrasound images were obtained for documentation. The right side of the neck was prepped and draped in a sterile fashion. The right neck was anesthetized with 1% lidocaine. Maximal barrier sterile technique was utilized including caps, mask, sterile gowns, sterile gloves, sterile drape, hand hygiene and skin antiseptic. A small incision was made with #11 blade scalpel. A 21 gauge needle directed into the right internal jugular vein with ultrasound guidance. A micropuncture dilator set was placed. 20 cm Mahurkar catheter was selected. J wire was advanced into the IVC. Tract was dilated to accommodate the catheter. Catheter tip placed near the superior cavoatrial junction. All the lumens aspirated and flushed well. Appropriate amount of heparin was placed in the dialysis lumens. Catheter was sutured to the skin with Prolene suture. Fluoroscopic and ultrasound images were taken and saved for documentation. FINDINGS: Patent right  internal jugular vein. Catheter tip near the superior cavoatrial junction. IMPRESSION: Successful placement of a non tunneled dialysis catheter using ultrasound and fluoroscopic guidance. Electronically Signed   By: Markus Daft M.D.   On: 11/14/2015 19:52   Scheduled Meds: . allopurinol  150 mg Oral Daily  . amLODipine  10 mg Oral Daily  . aspirin  81 mg Oral Daily  . calcitRIOL  0.5 mcg Oral Daily  . docusate sodium  100 mg Oral Daily  . famotidine  20 mg Oral QHS  . feeding supplement  1 Container Oral TID WC  . furosemide  160 mg Intravenous Q6H  . hydrALAZINE  25 mg Oral Q8H  . insulin aspart  0-15 Units Subcutaneous TID WC  . insulin aspart  5 Units Subcutaneous TID WC  . insulin glargine  20 Units Subcutaneous QHS  . levothyroxine  137 mcg Oral QAC breakfast  . magnesium oxide  400 mg Oral BID  . metolazone  10 mg Oral Daily  . metoprolol succinate  100  mg Oral Daily  . pantoprazole  40 mg Oral BID  . predniSONE  30 mg Oral Daily  . rosuvastatin  10 mg Oral QPM  . saccharomyces boulardii  250 mg Oral BID  . tacrolimus  1 mg Oral BID  . Warfarin - Pharmacist Dosing Inpatient   Does not apply q1800   PRN Meds alum & mag hydroxide-simeth, guaiFENesin-dextromethorphan, heparin, hydrALAZINE, HYDROcodone-acetaminophen, labetalol, levalbuterol, lidocaine, metoprolol, ondansetron (ZOFRAN) IV, phenol  Infusion Meds   Background: 75 y.o. male.  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  Creatine baseline appears to be 1.8-2.0.  Primary nephrologist is Dr. Justin Mend. Saw him on 06/26/2015 with BUN 27, creatinine 1.88.  Admitted on 10/28/2015 with necrotic left great toe with cellulitis. Creatinine on admission was 2.6.  Seen by [name] in consult on 7/25.  Creatinine had been improving until last couple days when started to trend back up post aortogram and in setting of being up  13 liters since admission.  Assessment/Plan:  Renal transplant (2005)  with AKI on CKD4 post contrast administration - Baseline creatinine 1.88-2 Timing of increased creatinine coincides with aortogram on August 1, however, atheroembolic event remains possible as well given his stuttering creatinine increases.  It appears he received 60cc of contrast during procedure which is a moderate contrast load given his GFR - Renal allograft function had continued to worsen with refractory volume overload so dialysis begun yesterday.  Patient to return for HD again today.  - Less likely acute rejection of kidney given no change in tenderness, no improvement in creatinine on high dose of Prednisone.  Will resume his usual dose of 5mg  daily.  Also on his Prograf. - discontinue diuretics with HD now. - upper extremity vein mapping ordered.  CKD-MBD  On calcitriol 0.56mcg daily, phosphorous 250mg  BID PTH on 7/25 was 82  HTN/Volume:   BP has been 101-142/39-68 last 24 hours  Still volume overloaded but now removing fluid with HD.   Hydralazine TID in addition to amlodipine and Lasix.  Will have to watch BP closely now that he is on HD. Cardiology following.   Continuing current medications  DM Per primary service  AFib:  BB for rate control, on heparin, coumadin per [facility]  Gangrene/Cellulitis/Toe Amputation:   S/p atherectomy and DCB L popliteal art, and PTA left anterior tibial artery 11/05/2015.   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   [facility] following.  HLD  Hypothyroidism   Jule Ser  7:54 AM    Renal Attending: As above, he tolerated his first treatment via a right IJ DC.  Renal allograft tenderness unchanged so I suspect not rejection.  We will reduce prednisone.  If this is CIN there is a chance of recovery.  Will dialyze again for volume.  He may need OP HD for AKI pending potential recovery. Wasyl Dornfeld C

## 2015-11-15 NOTE — Progress Notes (Signed)
Patient ID: OBALOLUWA GEARS, male   DOB: [birth year], 75 y.o.   MRN: [id number]                                                                PROGRESS NOTE                                                                                                                                                                                                             Patient Demographics:    Mark [name], is a 75 y.o. male, DOB - 08-Apr-1940, [id number]  Admit date - 10/28/2015   Admitting Physician Etta Quill, DO  Outpatient Primary MD for the patient is Rochel Brome, MD  LOS - 18  Outpatient Specialists:   Chief Complaint  Patient presents with  . 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 Patient presents to ED with c/o 'his toe is dead'. Creatinine baseline appears to be 1.8-2.3.  Primary nephrologist is Dr. Justin Mend.  Saw him on 06/26/2015 with BUN 27, creatinine 1.88.  Admitted on 10/28/2015 with necrotic left great toe with cellulitis.  Creatinine on admission was 2.6.  Seen by [name] in consult on 7/25.  His Prograf level was found to be low and was increased.  Patient underwent abdominal aortogram with angioplasty of the left anterior tibial artery and left popliteal artery on 11/05/2015.  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  He was on Vancomycin and Zosyn from 7/24-8/3.  Currently only on Zosyn.  He developed some dyspnea in the setting of being positive about 13 liters since admission with evidence of volume overload on exam. Worsening renal failure. Placement of temporary hemodialysis catheter on 8/10. Now started on HD. Will follow clinical response and renal function recovery.   Subjective:    Mark [name]  With flat affect, in no distress. Reports breathing is ok. deneis palpitations, CO, HA's or any other complaints.  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 -cardiology and nephrology were managing diuretics/fluid overload status with HD now -2-D echo did not show any wall motion abnormalities and the patient does not appear to have any ischemic changes on his EKG -Renal allograft function worse today with Creatinine 4.95 -Per nephrology rec's will continue intermittent HD for now -patient is still oliguric -will follow daily weights and strict intake and output  Gangrene of left  Toe/Toe Amputation: -S /P atherectomy and DCB Left popliteal artery and PTA left ATA (POD 5) -S/P Ray amputation of left great toe (POD 4) -Palpable Left popliteal pulse; wound is healing properly -vascular surgery on board. -patient has completed antibiotic therapy  Anemia of chronic diseases,  -FOBT positive, but no signs of acute bleeding appreciated -with ongoing hemodilution from anasarca/fluid overload, Hgb has remained stable around 10.5 for several days.  -Continue PPI twice a day -follow Hgb trend   Diarrhea-C. difficile negative 2 during this admission -will continue probiotic  Acute on Chronic kidney disease stage IV, status post renal transplant , baseline creatinine around 1.7, now up to 4.9 and with oliguria -thought to be acute process caused by contrast  -requiring HD -will continue tacrolimus -renal service on board, will follow rec's  A fib with aberrancy  -  -continue b-blocker for rate controlled -on coumadin for anticoagulation (per pharmacy) -cardiology on board, will follow rec's -CHADsVASC score 4  DM2 -  uncontrolled -Hemoglobin A1c 8.2 -will continue SSI, meal coverage and lantus  Hypertension: uncontrolled -Continue norvasc and metoprolol -Prn hydralazine also ordered -HD will also help with BP  Hypothyroidism -continue Synthroid  Protein Calorie Malnutrition -will continue Prostat -albumin 2.1 -nutritional service consulted   Dyslipidemia-continue Crestor  Pulmonary hypertension:  Mark [name] has moderate pulmonary hypertension.    -Cardiology Recommending outpatient sleep study and PFTs.   -He does have grade 2 diastolic dysfunction and will benefit of volume control -currently needing HD -will follow cardiology and renal service rec's  Code Status : FULL CODE  Family Communication  :   Disposition Plan  :  ? Will likely need SNF at discharge; will follow PT evaluation.  Barriers For Discharge : worsening renal function, and the patient on dialysis now  Consults  :  Vascular surgery, nephrology, cardiology  Procedures  : Atherectomy and drug coated balloon angioplasty 8/1 , left ray amputation 8/2  DVT Prophylaxis  :  Heparin     Lab Results  Component Value Date   PLT 267 11/15/2015    Antibiotics  :     10/28/15 2200  piperacillin-tazobactam (ZOSYN) IVPB 3.375 g  Status:  Discontinued     3.375 g 12.5 mL/hr over 240 Minutes Intravenous Every 8 hours 10/28/15 2120 11/09/15 1057   10/28/15 2200  vancomycin (VANCOCIN) 2,500 mg in sodium chloride 0.9 % 500 mL IVPB     2,500 mg 250 mL/hr over 120 Minutes Intravenous  Once 10/28/15 2154 10/29/15 2330        Objective:   Vitals:   11/15/15 1400 11/15/15 1430 11/15/15 1500 11/15/15 1530  BP: 139/65 120/62 (!) 128/52 Marland Kitchen)  140/47  Pulse: 76 61 69 (!) 58  Resp:      Temp:      TempSrc:      SpO2:      Weight:      Height:        Wt  Readings from Last 3 Encounters:  11/15/15 106.2 kg (234 lb 2.1 oz)  09/05/15 103.1 kg (227 lb 3.2 oz)  08/05/15 101.8 kg (224 lb 6.4 oz)     Intake/Output Summary (Last 24 hours) at 11/15/15 1623 Last data filed at 11/14/15 2152  Gross per 24 hour  Intake                0 ml  Output             2000 ml  Net            -2000 ml     Physical Exam General: AAOX3, in no acute distress. Denies CP and reports breathing is stable. Normal affect Head/eyes: [place]/AT,PERRLA, no icterus Neck:Supple Neck, right IJ HD catheter in place; No cervical lymphadenopathy appriciated.  Lungs: Symmetrical Chest wall movement, Good air movement bilaterally, decrease breath sounds at bases, no wheezing  Heart: rate controlled. Positive A. Fib on telemetry, no rubs or gallops Abdomen: +ve B.Sounds, Abd Soft, No tenderness, No organomegaly appriciated, No rebound - guarding or rigidity. Extremities: Diffuse anasarca, 3+ pitting edema throughout; left ray amputation, with clean dressing and just minimal serosanguineous drainage.   Data Review:    CBC  Recent Labs Lab 11/11/15 0314 11/13/15 1357 11/14/15 0430 11/14/15 1845 11/15/15 1313  WBC 8.6 7.7 9.9 7.7 8.2  HGB 10.6* 10.2* 10.5* 10.7* 10.1*  HCT 32.9* 30.9* 32.0* 32.1* 29.9*  PLT 284 279 296 304 267  MCV 94.3 96.0 95.2 94.7 95.8  MCH 30.4 31.7 31.3 31.6 32.4  MCHC 32.2 33.0 32.8 33.3 33.8  RDW 16.3* 16.4* 16.6* 16.9* 16.9*    Chemistries   Recent Labs Lab 11/09/15 0256 11/10/15 0313 11/11/15 0314  11/13/15 0802 11/14/15 0430 11/14/15 1817 11/15/15 1153 11/15/15 1313  NA 137 138 138  < > 136 135 132* 134* 133*  K 4.5 4.8 4.6  < > 5.0 4.4 4.8 4.2 4.1  CL 109 110 109  < > 104 103 102 99* 99*  CO2 20* 21* 21*  < > 19* 21* 20* 24 24  GLUCOSE 147* 225* 163*  < > 253* 209* 210* 143* 140*  BUN 45* 68* 72*  < > 100* 109* 116* 72* 72*  CREATININE 2.78* 2.98* 3.06*  < > 3.63* 4.53* 4.95* 4.16* 4.09*  CALCIUM 8.6* 8.6* 8.8*  < > 8.8*  8.7* 8.6* 8.2* 8.0*  AST 18 16 17   --   --  20  --   --   --   ALT 15* 15* 16*  --   --  19  --   --   --   ALKPHOS 98 93 101  --   --  116  --   --   --   BILITOT 1.1 0.6 0.8  --   --  0.8  --   --   --   < > = values in this interval not displayed. ------------------------------------------------------------------------------------------------------------------ No results for input(s): CHOL, HDL, LDLCALC, TRIG, CHOLHDL, LDLDIRECT in the last 72 hours.  Lab Results  Component Value Date   HGBA1C 8.2 (H) 10/29/2015   ------------------------------------------------------------------------------------------------------------------ No results for input(s): TSH, T4TOTAL, T3FREE, THYROIDAB in the last 72 hours.  Invalid input(s): FREET3 ------------------------------------------------------------------------------------------------------------------ No results for input(s): VITAMINB12, FOLATE, FERRITIN, TIBC, IRON, RETICCTPCT in the last 72 hours.  Coagulation profile  Recent Labs Lab 11/11/15 0314 11/12/15 0350 11/13/15 0802 11/14/15 0430 11/15/15 0303  INR 3.83 3.65 3.37 3.38 3.14    Cardiac Enzymes No results for input(s): CKMB, TROPONINI, MYOGLOBIN in the last 168 hours.  Invalid input(s): CK ------------------------------------------------------------------------------------------------------------------ No results found for: BNP  Inpatient Medications  Scheduled Meds: . allopurinol  150 mg Oral Daily  . amLODipine  10 mg Oral Daily  . aspirin  81 mg Oral Daily  . calcitRIOL  0.5 mcg Oral Daily  . docusate sodium  100 mg Oral Daily  . famotidine  20 mg Oral QHS  . feeding supplement  1 Container Oral TID WC  . hydrALAZINE  25 mg Oral Q8H  . insulin aspart  0-15 Units Subcutaneous TID WC  . insulin aspart  5 Units Subcutaneous TID WC  . insulin glargine  20 Units Subcutaneous QHS  . levothyroxine  137 mcg Oral QAC breakfast  . magnesium oxide  400 mg Oral BID  .  metoprolol succinate  100 mg Oral Daily  . pantoprazole  40 mg Oral BID  . [START ON 11/16/2015] predniSONE  5 mg Oral Daily  . rosuvastatin  10 mg Oral QPM  . saccharomyces boulardii  250 mg Oral BID  . tacrolimus  1 mg Oral BID  . Warfarin - Pharmacist Dosing Inpatient   Does not apply q1800   Continuous Infusions:   PRN Meds:.alum & mag hydroxide-simeth, guaiFENesin-dextromethorphan, heparin, hydrALAZINE, HYDROcodone-acetaminophen, labetalol, levalbuterol, lidocaine, metoprolol, ondansetron (ZOFRAN) IV, phenol  Micro Results Recent Results (from the past 240 hour(s))  Surgical PCR screen     Status: Abnormal   Collection Time: 11/05/15 11:18 PM  Result Value Ref Range Status   MRSA, PCR NEGATIVE NEGATIVE Final   Staphylococcus aureus POSITIVE (A) NEGATIVE Final    Comment:        The Xpert SA Assay (FDA approved for NASAL specimens in patients over 13 years of age), is one component of a comprehensive surveillance program.  Test performance has been validated by [facility] for patients greater than or equal to 73 year old. It is not intended to diagnose infection nor to guide or monitor treatment.   Fungus Culture With Stain (Not @ [facility])     Status: None (Preliminary result)   Collection Time: 11/06/15  2:33 PM  Result Value Ref Range Status   Fungus Stain Final report  Final    Comment: (NOTE) Performed At: [facility] [place], [place] [id number] Lindon Romp MD [id number]    Fungus (Mycology) Culture PENDING  Incomplete   Fungal Source WOUND  Final    Comment: LEFT TOE POF VANC AND ZOSYN   Acid Fast Smear (AFB)     Status: None   Collection Time: 11/06/15  2:33 PM  Result Value Ref Range Status   AFB Specimen Processing Concentration  Final   Acid Fast Smear Negative  Final    Comment: (NOTE) Performed At: [facility] [place], [place] [id number] Lindon Romp MD [id number]    Source (AFB) WOUND   Final    Comment: LEFT TOE POF VANC AND ZOSYN   Aerobic Culture (superficial specimen)     Status: None   Collection Time: 11/06/15  2:33 PM  Result Value Ref Range Status   Specimen Description WOUND LEFT TOE  Final   Special  Requests GREAT TOE POF VANC AND ZOSYN  Final   Gram Stain   Final    NO WBC SEEN FEW SQUAMOUS EPITHELIAL CELLS PRESENT FEW GRAM POSITIVE COCCI IN CLUSTERS    Culture MULTIPLE ORGANISMS PRESENT, NONE PREDOMINANT  Final   Report Status 11/08/2015 FINAL  Final  Fungus Culture Result     Status: None   Collection Time: 11/06/15  2:33 PM  Result Value Ref Range Status   Result 1 Comment  Final    Comment: (NOTE) KOH/Calcofluor preparation:  no fungus observed. Performed At: [facility] [place], [place] [id number] Lindon Romp MD [id number]   C difficile quick scan w PCR reflex     Status: None   Collection Time: 11/09/15  4:08 PM  Result Value Ref Range Status   C Diff antigen NEGATIVE NEGATIVE Final   C Diff toxin NEGATIVE NEGATIVE Final   C Diff interpretation No C. difficile detected.  Final    Radiology Reports Ir Fluoro Guide Cv Line Right  Result Date: 11/14/2015 INDICATION: Acute kidney injury and needs hemodialysis. Plan for placement of non tunneled catheter due to elevated INR level. EXAM: FLUOROSCOPIC AND ULTRASOUND GUIDED PLACEMENT OF A NON TUNNELED DIALYSIS CATHETER Physician: Stephan Minister. Henn, MD MEDICATIONS: None ANESTHESIA/SEDATION: None FLUOROSCOPY TIME:  Fluoroscopy Time: 24 seconds COMPLICATIONS: None immediate. PROCEDURE: Informed consent was obtained for placement of a non tunneled dialysis catheter. The patient was placed supine on the interventional table. Ultrasound confirmed a patent right internal jugular vein. Ultrasound images were obtained for documentation. The right side of the neck was prepped and draped in a sterile fashion. The right neck was anesthetized with 1% lidocaine. Maximal barrier sterile  technique was utilized including caps, mask, sterile gowns, sterile gloves, sterile drape, hand hygiene and skin antiseptic. A small incision was made with #11 blade scalpel. A 21 gauge needle directed into the right internal jugular vein with ultrasound guidance. A micropuncture dilator set was placed. 20 cm Mahurkar catheter was selected. J wire was advanced into the IVC. Tract was dilated to accommodate the catheter. Catheter tip placed near the superior cavoatrial junction. All the lumens aspirated and flushed well. Appropriate amount of heparin was placed in the dialysis lumens. Catheter was sutured to the skin with Prolene suture. Fluoroscopic and ultrasound images were taken and saved for documentation. FINDINGS: Patent right internal jugular vein. Catheter tip near the superior cavoatrial junction. IMPRESSION: Successful placement of a non tunneled dialysis catheter using ultrasound and fluoroscopic guidance. Electronically Signed   By: Markus Daft M.D.   On: 11/14/2015 19:52   Ir US Guide Vasc Access Right  Result Date: 11/14/2015 INDICATION: Acute kidney injury and needs hemodialysis. Plan for placement of non tunneled catheter due to elevated INR level. EXAM: FLUOROSCOPIC AND ULTRASOUND GUIDED PLACEMENT OF A NON TUNNELED DIALYSIS CATHETER Physician: Stephan Minister. Henn, MD MEDICATIONS: None ANESTHESIA/SEDATION: None FLUOROSCOPY TIME:  Fluoroscopy Time: 24 seconds COMPLICATIONS: None immediate. PROCEDURE: Informed consent was obtained for placement of a non tunneled dialysis catheter. The patient was placed supine on the interventional table. Ultrasound confirmed a patent right internal jugular vein. Ultrasound images were obtained for documentation. The right side of the neck was prepped and draped in a sterile fashion. The right neck was anesthetized with 1% lidocaine. Maximal barrier sterile technique was utilized including caps, mask, sterile gowns, sterile gloves, sterile drape, hand hygiene and skin  antiseptic. A small incision was made with #11 blade scalpel. A 21 gauge needle directed into  the right internal jugular vein with ultrasound guidance. A micropuncture dilator set was placed. 20 cm Mahurkar catheter was selected. J wire was advanced into the IVC. Tract was dilated to accommodate the catheter. Catheter tip placed near the superior cavoatrial junction. All the lumens aspirated and flushed well. Appropriate amount of heparin was placed in the dialysis lumens. Catheter was sutured to the skin with Prolene suture. Fluoroscopic and ultrasound images were taken and saved for documentation. FINDINGS: Patent right internal jugular vein. Catheter tip near the superior cavoatrial junction. IMPRESSION: Successful placement of a non tunneled dialysis catheter using ultrasound and fluoroscopic guidance. Electronically Signed   By: Markus Daft M.D.   On: 11/14/2015 19:52   Dg Chest Port 1 View  Result Date: 11/09/2015 CLINICAL DATA:  Shortness of breath EXAM: PORTABLE CHEST 1 VIEW COMPARISON:  November 06, 2015 FINDINGS: Mild cardiomegaly. The hila and mediastinum are unchanged. Diffuse bilateral interstitial opacities again identified and similar in the interval. The opacity is a little more focal in the left suprahilar region which is increased since November 05, 2015 but unchanged since November 06, 2015. No other acute interval changes. There may be tiny bilateral effusions with blunting of the costophrenic angles. IMPRESSION: 1. Suspected tiny bilateral pleural effusions and diffuse increased interstitial markings are most consistent with pulmonary edema. Atypical viral infection could have a similar appearance. Recommend clinical correlation. *Opacity in the left suprahilar region is a little more focal when compared November 05, 2015. The difference could be due to confluence of shadows. Recommend follow-up to resolution. Electronically Signed   By: Dorise Bullion III M.D   On: 11/09/2015 09:14   Dg Chest Port 1  View  Result Date: 11/06/2015 CLINICAL DATA:  75 year old male with increasing shortness of breath and abnormal left side pulmonary auscultation. Initial encounter. EXAM: PORTABLE CHEST 1 VIEW COMPARISON:  0508 hours today and earlier. FINDINGS: Portable AP semi upright view at 2127 hours. Continued and mildly increased perihilar and basilar predominant interstitial pulmonary opacity. Stable cardiac size and mediastinal contours. No pneumothorax. No pleural effusion or consolidation identified. IMPRESSION: Increased bilateral pulmonary interstitial opacity. Top differential considerations include progression of interstitial edema versus viral/ atypical respiratory infection. No pleural effusion identified. Electronically Signed   By: Genevie Ann M.D.   On: 11/06/2015 21:42   Dg Chest Port 1 View  Result Date: 11/06/2015 CLINICAL DATA:  75 year old male with aspiration of liquid. EXAM: PORTABLE CHEST 1 VIEW COMPARISON:  Chest radiograph dated 11/05/2015 FINDINGS: Mild cardiomegaly with central vascular congestion. Bibasilar Interstitial prominence and nodular densities, new from prior study may represent atelectatic changes versus infiltrate. There is no pleural effusion or pneumothorax. Top-normal cardiac silhouette. No acute osseous pathology. IMPRESSION: Mild congestive changes with bibasilar subsegmental atelectasis versus infiltrate. Electronically Signed   By: Anner Crete M.D.   On: 11/06/2015 05:16   Dg Chest Port 1 View  Result Date: 11/05/2015 CLINICAL DATA:  Shortness of breath EXAM: PORTABLE CHEST 1 VIEW COMPARISON:  March 14, 2015 FINDINGS: The lungs are clear. Heart is mildly enlarged with pulmonary vascularity within normal limits. No adenopathy. No bone lesions. IMPRESSION: Mild cardiac enlargement.  No edema or consolidation. Electronically Signed   By: Lowella Grip III M.D.   On: 11/05/2015 09:42   Dg Foot Complete Left  Result Date: 10/28/2015 CLINICAL DATA:  Left big toe pain.  EXAM: LEFT FOOT - COMPLETE 3+ VIEW COMPARISON:  None. FINDINGS: There is no evidence of fracture or dislocation. No evidence of dystrophic bony changes.  There is soft tissue swelling of the distal first digit. Punctate lucencies within the area of soft tissue swelling may represent entrapped fat or potentially foci of gas. Heavy vascular calcifications are seen. IMPRESSION: No evidence of fracture of the left foot. Soft tissue swelling of the distal first digit with punctate lucencies within the area of swelling, which may represent entrapped fat or potentially foci of gas. Please correlate clinically regarding the possibility of cellulitis. Electronically Signed   By: Fidela Salisbury M.D.   On: 10/28/2015 21:01   Time Spent in minutes  30   Barton Dubois M.D on 11/15/2015 at 4:23 PM [phone]

## 2015-11-15 NOTE — Progress Notes (Signed)
   Amputation site with minimal fibrinous material at distal site. Evidence of minimal granulation. Continue dressing changes for now and will hopefully heal s/p revascularization.   Brandon C. Donzetta Matters, MD [facility] Office: [phone] Pager: [phone]

## 2015-11-15 NOTE — Clinical Social Work Note (Signed)
CSW continuing to follow for support and discharge needs. Shonny Shelby Peltz, LCSW [phone] 

## 2015-11-15 NOTE — Progress Notes (Signed)
   Reviewed tele.  AFib, rate controlled in the [place], MD

## 2015-11-15 NOTE — Progress Notes (Signed)
   11/15/15 1300  PT Visit Information  Last PT Received On 11/15/15  Reason Eval/Treat Not Completed Patient at procedure or test/unavailable  Mee Hives, PT MS Acute Rehab Dept. Number: [facility] [place]

## 2015-11-16 ENCOUNTER — Inpatient Hospital Stay (HOSPITAL_COMMUNITY): Payer: Medicare Other

## 2015-11-16 DIAGNOSIS — R0989 Other specified symptoms and signs involving the circulatory and respiratory systems: Secondary | ICD-10-CM

## 2015-11-16 LAB — RENAL FUNCTION PANEL
ANION GAP: 10 (ref 5–15)
Albumin: 2.4 g/dL — ABNORMAL LOW (ref 3.5–5.0)
BUN: 43 mg/dL — ABNORMAL HIGH (ref 6–20)
CHLORIDE: 97 mmol/L — AB (ref 101–111)
CO2: 27 mmol/L (ref 22–32)
Calcium: 8 mg/dL — ABNORMAL LOW (ref 8.9–10.3)
Creatinine, Ser: 3.4 mg/dL — ABNORMAL HIGH (ref 0.61–1.24)
GFR, EST AFRICAN AMERICAN: 19 mL/min — AB (ref >60)
GFR, EST NON AFRICAN AMERICAN: 16 mL/min — AB (ref >60)
Glucose, Bld: 124 mg/dL — ABNORMAL HIGH (ref 65–99)
POTASSIUM: 3.9 mmol/L (ref 3.5–5.1)
Phosphorus: 5.3 mg/dL — ABNORMAL HIGH (ref 2.5–4.6)
Sodium: 134 mmol/L — ABNORMAL LOW (ref 135–145)

## 2015-11-16 LAB — CBC
HEMATOCRIT: 30.4 % — AB (ref 39.0–52.0)
Hemoglobin: 10.3 g/dL — ABNORMAL LOW (ref 13.0–17.0)
MCH: 32.5 pg (ref 26.0–34.0)
MCHC: 33.9 g/dL (ref 30.0–36.0)
MCV: 95.9 fL (ref 78.0–100.0)
PLATELETS: 246 10*3/uL (ref 150–400)
RBC: 3.17 MIL/uL — ABNORMAL LOW (ref 4.22–5.81)
RDW: 16.9 % — AB (ref 11.5–15.5)
WBC: 7.8 10*3/uL (ref 4.0–10.5)

## 2015-11-16 LAB — GLUCOSE, CAPILLARY
GLUCOSE-CAPILLARY: 160 mg/dL — AB (ref 65–99)
GLUCOSE-CAPILLARY: 146 mg/dL — AB (ref 65–99)
GLUCOSE-CAPILLARY: 203 mg/dL — AB (ref 65–99)
Glucose-Capillary: 124 mg/dL — ABNORMAL HIGH (ref 65–99)

## 2015-11-16 LAB — PROTIME-INR
INR: 2.29
Prothrombin Time: 25.6 seconds — ABNORMAL HIGH (ref 11.4–15.2)

## 2015-11-16 MED ORDER — WARFARIN SODIUM 2 MG PO TABS
2.0000 mg | ORAL_TABLET | Freq: Once | ORAL | Status: AC
Start: 2015-11-16 — End: 2015-11-16
  Administered 2015-11-16: 2 mg via ORAL
  Filled 2015-11-16 (×2): qty 1

## 2015-11-16 MED ORDER — DIPHENOXYLATE-ATROPINE 2.5-0.025 MG PO TABS: 1.0000 | ORAL_TABLET | Freq: Two times a day (BID) | ORAL | Status: DC | PRN

## 2015-11-16 NOTE — Progress Notes (Signed)
Right  Upper Extremity Vein Map    Cephalic  Segment Diameter Depth Comment  1. Axilla 3.83mm 5.35mm   2. Mid upper arm 2.56mm 48mm   3. Above AC 3.61mm 71mm   4. In AC 5.69mm 1.84mm   5. Below AC 66mm 7mm   6. Mid forearm 2.20mm 36mm branches  7. Wrist 83mm 3.73mm    mm mm    mm mm    mm mm    Basilic  Segment Diameter Depth Comment  1. Axilla 18mm 40mm   2. Mid upper arm 7.83mm 65mm branch  3. Above AC 7mm 94mm   4. In [facility] 4.64mm 58mm   5. Below AC 4.27mm 65mm   6. Mid forearm 4.26mm 63mm branches  7. Wrist 2.27mm 39mm    mm mm    mm mm    mm mm    Left Upper Extremity Vein Map    Cephalic  Segment Diameter Depth Comment  1. Axilla 46mm 5.15mm   2. Mid upper arm 4.37mm 4.31mm   3. Above AC 40mm 5.61mm   4. In AC 56mm 3.48mm   5. Below AC 3.44mm 20mm   6. Mid forearm 3.45mm 4.43mm   7. Wrist 2.67mm 2.60mm    mm mm    mm mm    mm mm

## 2015-11-16 NOTE — Progress Notes (Signed)
Assessment/Plan:  Renal transplant (2005)  with AKI on CKD4 post contrast administration - Baseline creatinine 1.88-2 PVD/Toe Amp/Cellulitis AFib Vol XS  Plan: Will support with dialysis, again today. Might do a 3 month trial for AKI with hope for recovery and continue all ant-irejection meds Vein Mapping when edema in arms better TDC next week and poss AV access  Subjective: Interval History: Vol down with HD, no oxygen now  Objective: Vital signs in last 24 hours: Temp:  [97.4 F (36.3 C)-97.8 F (36.6 C)] 97.6 F (36.4 C) (08/12 0549) Pulse Rate:  [58-80] 61 (08/12 0549) Resp:  [18-20] 20 (08/12 0549) BP: (101-140)/(42-65) 102/43 (08/12 0549) SpO2:  [94 %-97 %] 94 % (08/12 0549) Weight:  [104.3 kg (229 lb 15 oz)-106.2 kg (234 lb 2.1 oz)] 104.3 kg (229 lb 15 oz) (08/11 1606) Weight change: -3.8 kg (-8 lb 6 oz)  Intake/Output from previous day: 08/11 0701 - 08/12 0700 In: 100 [P.O.:100] Out: 3042 [Urine:50] Intake/Output this shift: No intake/output data recorded.  General appearance: alert and cooperative Resp: clear to auscultation bilaterally Extremities: edema improved  Lab Results:  Recent Labs  11/14/15 1845 11/15/15 1313  WBC 7.7 8.2  HGB 10.7* 10.1*  HCT 32.1* 29.9*  PLT 304 267   BMET:  Recent Labs  11/15/15 1313 11/16/15 0415  NA 133* 134*  K 4.1 3.9  CL 99* 97*  CO2 24 27  GLUCOSE 140* 124*  BUN 72* 43*  CREATININE 4.09* 3.40*  CALCIUM 8.0* 8.0*   No results for input(s): PTH in the last 72 hours. Iron Studies: No results for input(s): IRON, TIBC, TRANSFERRIN, FERRITIN in the last 72 hours. Studies/Results: Ir Fluoro Guide Cv Line Right  Result Date: 11/14/2015 INDICATION: Acute kidney injury and needs hemodialysis. Plan for placement of non tunneled catheter due to elevated INR level. EXAM: FLUOROSCOPIC AND ULTRASOUND GUIDED PLACEMENT OF A NON TUNNELED DIALYSIS CATHETER Physician: Stephan Minister. Henn, MD MEDICATIONS: None  ANESTHESIA/SEDATION: None FLUOROSCOPY TIME:  Fluoroscopy Time: 24 seconds COMPLICATIONS: None immediate. PROCEDURE: Informed consent was obtained for placement of a non tunneled dialysis catheter. The patient was placed supine on the interventional table. Ultrasound confirmed a patent right internal jugular vein. Ultrasound images were obtained for documentation. The right side of the neck was prepped and draped in a sterile fashion. The right neck was anesthetized with 1% lidocaine. Maximal barrier sterile technique was utilized including caps, mask, sterile gowns, sterile gloves, sterile drape, hand hygiene and skin antiseptic. A small incision was made with #11 blade scalpel. A 21 gauge needle directed into the right internal jugular vein with ultrasound guidance. A micropuncture dilator set was placed. 20 cm Mahurkar catheter was selected. J wire was advanced into the IVC. Tract was dilated to accommodate the catheter. Catheter tip placed near the superior cavoatrial junction. All the lumens aspirated and flushed well. Appropriate amount of heparin was placed in the dialysis lumens. Catheter was sutured to the skin with Prolene suture. Fluoroscopic and ultrasound images were taken and saved for documentation. FINDINGS: Patent right internal jugular vein. Catheter tip near the superior cavoatrial junction. IMPRESSION: Successful placement of a non tunneled dialysis catheter using ultrasound and fluoroscopic guidance. Electronically Signed   By: Markus Daft M.D.   On: 11/14/2015 19:52   Ir US Guide Vasc Access Right  Result Date: 11/14/2015 INDICATION: Acute kidney injury and needs hemodialysis. Plan for placement of non tunneled catheter due to elevated INR level. EXAM: FLUOROSCOPIC AND ULTRASOUND GUIDED PLACEMENT OF A NON TUNNELED  DIALYSIS CATHETER Physician: Stephan Minister. Henn, MD MEDICATIONS: None ANESTHESIA/SEDATION: None FLUOROSCOPY TIME:  Fluoroscopy Time: 24 seconds COMPLICATIONS: None immediate. PROCEDURE:  Informed consent was obtained for placement of a non tunneled dialysis catheter. The patient was placed supine on the interventional table. Ultrasound confirmed a patent right internal jugular vein. Ultrasound images were obtained for documentation. The right side of the neck was prepped and draped in a sterile fashion. The right neck was anesthetized with 1% lidocaine. Maximal barrier sterile technique was utilized including caps, mask, sterile gowns, sterile gloves, sterile drape, hand hygiene and skin antiseptic. A small incision was made with #11 blade scalpel. A 21 gauge needle directed into the right internal jugular vein with ultrasound guidance. A micropuncture dilator set was placed. 20 cm Mahurkar catheter was selected. J wire was advanced into the IVC. Tract was dilated to accommodate the catheter. Catheter tip placed near the superior cavoatrial junction. All the lumens aspirated and flushed well. Appropriate amount of heparin was placed in the dialysis lumens. Catheter was sutured to the skin with Prolene suture. Fluoroscopic and ultrasound images were taken and saved for documentation. FINDINGS: Patent right internal jugular vein. Catheter tip near the superior cavoatrial junction. IMPRESSION: Successful placement of a non tunneled dialysis catheter using ultrasound and fluoroscopic guidance. Electronically Signed   By: Markus Daft M.D.   On: 11/14/2015 19:52    Scheduled: . allopurinol  150 mg Oral Daily  . aspirin  81 mg Oral Daily  . calcitRIOL  0.5 mcg Oral Daily  . docusate sodium  100 mg Oral Daily  . famotidine  20 mg Oral QHS  . feeding supplement  1 Container Oral TID WC  . hydrALAZINE  25 mg Oral Q8H  . insulin aspart  0-15 Units Subcutaneous TID WC  . insulin aspart  5 Units Subcutaneous TID WC  . insulin glargine  20 Units Subcutaneous QHS  . levothyroxine  137 mcg Oral QAC breakfast  . magnesium oxide  400 mg Oral BID  . metoprolol succinate  100 mg Oral Daily  .  pantoprazole  40 mg Oral BID  . predniSONE  5 mg Oral Daily  . rosuvastatin  10 mg Oral QPM  . saccharomyces boulardii  250 mg Oral BID  . tacrolimus  1 mg Oral BID  . Warfarin - Pharmacist Dosing Inpatient   Does not apply q1800     LOS: 19 days   Angelic Schnelle C 11/16/2015,8:10 AM

## 2015-11-16 NOTE — Progress Notes (Signed)
Patient ID: Mark [name], male   DOB: 1940-09-10, 75 y.o.   MRN: [id number]                                                                PROGRESS NOTE                                                                                                                                                                                                             Patient Demographics:    Mark [name], is a 75 y.o. male, DOB - Mar 24, 1941, [id number]  Admit date - 10/28/2015   Admitting Physician Etta Quill, DO  Outpatient Primary MD for the patient is Rochel Brome, MD  LOS - 19  Outpatient Specialists:   Chief Complaint  Patient presents with  . 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 Patient presents to ED with c/o 'his toe is dead'. Creatinine baseline appears to be 1.8-2.3.  Primary nephrologist is Dr. Justin Mend.  Saw him on 06/26/2015 with BUN 27, creatinine 1.88.  Admitted on 10/28/2015 with necrotic left great toe with cellulitis.  Creatinine on admission was 2.6.  Seen by [name] in consult on 7/25.  His Prograf level was found to be low and was increased.  Patient underwent abdominal aortogram with angioplasty of the left anterior tibial artery and left popliteal artery on 11/05/2015.  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  He was on Vancomycin and Zosyn from 7/24-8/3.  Currently only on Zosyn.  He developed some dyspnea in the setting of being positive about 13 liters since admission with evidence of volume overload on exam. Worsening renal failure. Placement of temporary hemodialysis catheter on 8/10. Now started on HD. Will follow clinical response and renal function recovery.   Subjective:    Mark [name]  With flat affect, in no distress. Reports breathing has remained stable. Complaining of still significant diarrhea.  Deneis palpitations, CP, HA's or any other complaints. Continue to have anasarca, even improved with hemodialysis treatment.   Assessment  & Plan :    Principal Problem:   Gangrene of toe (HCC) Active Problems:   Type II diabetes  mellitus, uncontrolled ([place])   Renal transplant recipient   Diabetic ulcer of left great toe ([place])   Cellulitis of great toe of left foot   Aspiration of liquid   SOB (shortness of breath)   Permanent atrial fibrillation (HCC)   Bibasilar crackles   CHF (congestive heart failure) (HCC)   Melena   Renal failure   Acute on chronic diastolic heart failure -Unable to diurese with the help of diuretics; renal function worsen and has required HD now. -cardiology and nephrology were managing diuretics/fluid overload status with HD now -2-D echo did not show any wall motion abnormalities and the patient does not appear to have any ischemic changes on his EKG -Renal allograft function worse today with Creatinine 4.95 -Per nephrology rec's will continue intermittent HD for now -patient is still oliguric -will follow daily weights and strict intake and output  Gangrene of left  Toe/Toe Amputation: -S /P atherectomy and DCB Left popliteal artery and PTA left ATA (POD 5) -S/P Ray amputation of left great toe (POD 4) -Palpable Left popliteal pulse; wound is healing properly -vascular surgery on board. -patient has completed antibiotic therapy  Anemia of chronic diseases,  -FOBT positive, but no signs of acute bleeding appreciated -with ongoing hemodilution from anasarca/fluid overload, Hgb has remained stable around 10.5 for several days.  -Continue PPI twice a day -follow Hgb trend   Diarrhea-C. difficile negative 2 during this admission -will continue probiotic -PRN lomotil also ordered  -No fever, no nausea, no vomiting, no abdominal pain.  Acute on Chronic kidney disease stage IV, status post renal transplant , baseline creatinine around 1.7, now up to  4.9 and with oliguria -thought to be acute process caused by contrast  -requiring HD -will continue tacrolimus -Had vein mapping today (anticipation of potential remanent dialysis catheter next week and possible AV access) -renal service on board, will follow rec's  A fib with aberrancy -  -continue b-blocker for rate controlled -on coumadin for anticoagulation (per pharmacy) -cardiology on board, will follow rec's -CHADsVASC score 4  DM2 -  uncontrolled -Hemoglobin A1c 8.2 -will continue SSI, meal coverage and lantus  Hypertension: uncontrolled -Continue norvasc and metoprolol -Prn hydralazine also ordered -HD will also help with BP  Hypothyroidism -continue Synthroid  Protein Calorie Malnutrition -will continue Prostat -albumin 2.1 -nutritional service consulted   Dyslipidemia-continue Crestor  Pulmonary hypertension:  [name] has moderate pulmonary hypertension.    -Cardiology Recommending outpatient sleep study and PFTs.   -He does have grade 2 diastolic dysfunction and will benefit of volume control -currently needing HD -will follow cardiology and renal service rec's  Code Status : FULL CODE  Family Communication  :   Disposition Plan  :  ? Will likely need SNF at discharge; will follow PT evaluation.  Barriers For Discharge : worsening renal function, and the patient on dialysis now  Consults  :  Vascular surgery, nephrology, cardiology  Procedures  : Atherectomy and drug coated balloon angioplasty 8/1 , left ray amputation 8/2  DVT Prophylaxis  :  Heparin     Lab Results  Component Value Date   PLT 267 11/15/2015    Antibiotics  :     10/28/15 2200  piperacillin-tazobactam (ZOSYN) IVPB 3.375 g  Status:  Discontinued     3.375 g 12.5 mL/hr over 240 Minutes Intravenous Every 8 hours 10/28/15 2120 11/09/15 1057   10/28/15 2200  vancomycin (VANCOCIN) 2,500 mg in sodium chloride 0.9 % 500 mL IVPB     2,500  mg 250 mL/hr over 120 Minutes  Intravenous  Once 10/28/15 2154 10/29/15 2330        Objective:   Vitals:   11/16/15 0549 11/16/15 1405 11/16/15 1414 11/16/15 1420  BP: (!) 102/43 (!) 108/51 (!) 93/47 (!) 108/49  Pulse: 61 (!) 52 64 (!) 57  Resp: 20 17    Temp: 97.6 F (36.4 C) 97.5 F (36.4 C)    TempSrc: Oral Oral    SpO2: 94% 97%    Weight:  104.6 kg (230 lb 9.6 oz)    Height:        Wt Readings from Last 3 Encounters:  11/16/15 104.6 kg (230 lb 9.6 oz)  09/05/15 103.1 kg (227 lb 3.2 oz)  08/05/15 101.8 kg (224 lb 6.4 oz)     Intake/Output Summary (Last 24 hours) at 11/16/15 1442 Last data filed at 11/15/15 1858  Gross per 24 hour  Intake              100 ml  Output             3042 ml  Net            -2942 ml     Physical Exam General: AAOX3, in no acute distress. Denies CP and reports breathing is stable. Normal affect. No requiring any oxygen supplementation and even with improvement in his anasarca, still with signs of fluid overload. Patient continued to be oliguric. Head/eyes: [place]/AT,PERRLA, no icterus Neck:Supple Neck, right IJ HD catheter in place; No cervical lymphadenopathy appriciated.  Lungs: Symmetrical Chest wall movement, Good air movement bilaterally, decrease breath sounds at bases, no wheezing  Heart: rate controlled. Positive A. Fib on telemetry, no rubs or gallops Abdomen: +ve B.Sounds, Abd Soft, No tenderness, No organomegaly appriciated, No rebound - guarding or rigidity. Extremities: Diffuse anasarca, 3+ pitting edema throughout; left ray amputation, with clean dressing and just minimal serosanguineous drainage.   Data Review:    CBC  Recent Labs Lab 11/11/15 0314 11/13/15 1357 11/14/15 0430 11/14/15 1845 11/15/15 1313  WBC 8.6 7.7 9.9 7.7 8.2  HGB 10.6* 10.2* 10.5* 10.7* 10.1*  HCT 32.9* 30.9* 32.0* 32.1* 29.9*  PLT 284 279 296 304 267  MCV 94.3 96.0 95.2 94.7 95.8  MCH 30.4 31.7 31.3 31.6 32.4  MCHC 32.2 33.0 32.8 33.3 33.8  RDW 16.3* 16.4* 16.6* 16.9* 16.9*     Chemistries   Recent Labs Lab 11/10/15 0313 11/11/15 0314  11/14/15 0430 11/14/15 1817 11/15/15 1153 11/15/15 1313 11/16/15 0415  NA 138 138  < > 135 132* 134* 133* 134*  K 4.8 4.6  < > 4.4 4.8 4.2 4.1 3.9  CL 110 109  < > 103 102 99* 99* 97*  CO2 21* 21*  < > 21* 20* 24 24 27   GLUCOSE 225* 163*  < > 209* 210* 143* 140* 124*  BUN 68* 72*  < > 109* 116* 72* 72* 43*  CREATININE 2.98* 3.06*  < > 4.53* 4.95* 4.16* 4.09* 3.40*  CALCIUM 8.6* 8.8*  < > 8.7* 8.6* 8.2* 8.0* 8.0*  AST 16 17  --  20  --   --   --   --   ALT 15* 16*  --  19  --   --   --   --   ALKPHOS 93 101  --  116  --   --   --   --   BILITOT 0.6 0.8  --  0.8  --   --   --   --   < > =  values in this interval not displayed. ------------------------------------------------------------------------------------------------------------------ No results for input(s): CHOL, HDL, LDLCALC, TRIG, CHOLHDL, LDLDIRECT in the last 72 hours.  Lab Results  Component Value Date   HGBA1C 8.2 (H) 10/29/2015   ------------------------------------------------------------------------------------------------------------------ No results for input(s): TSH, T4TOTAL, T3FREE, THYROIDAB in the last 72 hours.  Invalid input(s): FREET3 ------------------------------------------------------------------------------------------------------------------ No results for input(s): VITAMINB12, FOLATE, FERRITIN, TIBC, IRON, RETICCTPCT in the last 72 hours.  Coagulation profile  Recent Labs Lab 11/12/15 0350 11/13/15 0802 11/14/15 0430 11/15/15 0303 11/16/15 0415  INR 3.65 3.37 3.38 3.14 2.29    Cardiac Enzymes No results for input(s): CKMB, TROPONINI, MYOGLOBIN in the last 168 hours.  Invalid input(s): CK ------------------------------------------------------------------------------------------------------------------ No results found for: BNP  Inpatient Medications  Scheduled Meds: . allopurinol  150 mg Oral Daily  . aspirin  81  mg Oral Daily  . calcitRIOL  0.5 mcg Oral Daily  . docusate sodium  100 mg Oral Daily  . famotidine  20 mg Oral QHS  . feeding supplement  1 Container Oral TID WC  . hydrALAZINE  25 mg Oral Q8H  . insulin aspart  0-15 Units Subcutaneous TID WC  . insulin aspart  5 Units Subcutaneous TID WC  . insulin glargine  20 Units Subcutaneous QHS  . levothyroxine  137 mcg Oral QAC breakfast  . magnesium oxide  400 mg Oral BID  . metoprolol succinate  100 mg Oral Daily  . pantoprazole  40 mg Oral BID  . predniSONE  5 mg Oral Daily  . rosuvastatin  10 mg Oral QPM  . saccharomyces boulardii  250 mg Oral BID  . tacrolimus  1 mg Oral BID  . Warfarin - Pharmacist Dosing Inpatient   Does not apply q1800   Continuous Infusions:   PRN Meds:.alum & mag hydroxide-simeth, guaiFENesin-dextromethorphan, heparin, hydrALAZINE, HYDROcodone-acetaminophen, labetalol, levalbuterol, lidocaine, metoprolol, ondansetron (ZOFRAN) IV, phenol  Micro Results Recent Results (from the past 240 hour(s))  C difficile quick scan w PCR reflex     Status: None   Collection Time: 11/09/15  4:08 PM  Result Value Ref Range Status   C Diff antigen NEGATIVE NEGATIVE Final   C Diff toxin NEGATIVE NEGATIVE Final   C Diff interpretation No C. difficile detected.  Final    Radiology Reports Ir Fluoro Guide Cv Line Right  Result Date: 11/14/2015 INDICATION: Acute kidney injury and needs hemodialysis. Plan for placement of non tunneled catheter due to elevated INR level. EXAM: FLUOROSCOPIC AND ULTRASOUND GUIDED PLACEMENT OF A NON TUNNELED DIALYSIS CATHETER Physician: Stephan Minister. Henn, MD MEDICATIONS: None ANESTHESIA/SEDATION: None FLUOROSCOPY TIME:  Fluoroscopy Time: 24 seconds COMPLICATIONS: None immediate. PROCEDURE: Informed consent was obtained for placement of a non tunneled dialysis catheter. The patient was placed supine on the interventional table. Ultrasound confirmed a patent right internal jugular vein. Ultrasound images were  obtained for documentation. The right side of the neck was prepped and draped in a sterile fashion. The right neck was anesthetized with 1% lidocaine. Maximal barrier sterile technique was utilized including caps, mask, sterile gowns, sterile gloves, sterile drape, hand hygiene and skin antiseptic. A small incision was made with #11 blade scalpel. A 21 gauge needle directed into the right internal jugular vein with ultrasound guidance. A micropuncture dilator set was placed. 20 cm Mahurkar catheter was selected. J wire was advanced into the IVC. Tract was dilated to accommodate the catheter. Catheter tip placed near the superior cavoatrial junction. All the lumens aspirated and flushed well. Appropriate amount of heparin was placed in  the dialysis lumens. Catheter was sutured to the skin with Prolene suture. Fluoroscopic and ultrasound images were taken and saved for documentation. FINDINGS: Patent right internal jugular vein. Catheter tip near the superior cavoatrial junction. IMPRESSION: Successful placement of a non tunneled dialysis catheter using ultrasound and fluoroscopic guidance. Electronically Signed   By: Markus Daft M.D.   On: 11/14/2015 19:52   Ir US Guide Vasc Access Right  Result Date: 11/14/2015 INDICATION: Acute kidney injury and needs hemodialysis. Plan for placement of non tunneled catheter due to elevated INR level. EXAM: FLUOROSCOPIC AND ULTRASOUND GUIDED PLACEMENT OF A NON TUNNELED DIALYSIS CATHETER Physician: Stephan Minister. Henn, MD MEDICATIONS: None ANESTHESIA/SEDATION: None FLUOROSCOPY TIME:  Fluoroscopy Time: 24 seconds COMPLICATIONS: None immediate. PROCEDURE: Informed consent was obtained for placement of a non tunneled dialysis catheter. The patient was placed supine on the interventional table. Ultrasound confirmed a patent right internal jugular vein. Ultrasound images were obtained for documentation. The right side of the neck was prepped and draped in a sterile fashion. The right neck  was anesthetized with 1% lidocaine. Maximal barrier sterile technique was utilized including caps, mask, sterile gowns, sterile gloves, sterile drape, hand hygiene and skin antiseptic. A small incision was made with #11 blade scalpel. A 21 gauge needle directed into the right internal jugular vein with ultrasound guidance. A micropuncture dilator set was placed. 20 cm Mahurkar catheter was selected. J wire was advanced into the IVC. Tract was dilated to accommodate the catheter. Catheter tip placed near the superior cavoatrial junction. All the lumens aspirated and flushed well. Appropriate amount of heparin was placed in the dialysis lumens. Catheter was sutured to the skin with Prolene suture. Fluoroscopic and ultrasound images were taken and saved for documentation. FINDINGS: Patent right internal jugular vein. Catheter tip near the superior cavoatrial junction. IMPRESSION: Successful placement of a non tunneled dialysis catheter using ultrasound and fluoroscopic guidance. Electronically Signed   By: Markus Daft M.D.   On: 11/14/2015 19:52   Dg Chest Port 1 View  Result Date: 11/09/2015 CLINICAL DATA:  Shortness of breath EXAM: PORTABLE CHEST 1 VIEW COMPARISON:  November 06, 2015 FINDINGS: Mild cardiomegaly. The hila and mediastinum are unchanged. Diffuse bilateral interstitial opacities again identified and similar in the interval. The opacity is a little more focal in the left suprahilar region which is increased since November 05, 2015 but unchanged since November 06, 2015. No other acute interval changes. There may be tiny bilateral effusions with blunting of the costophrenic angles. IMPRESSION: 1. Suspected tiny bilateral pleural effusions and diffuse increased interstitial markings are most consistent with pulmonary edema. Atypical viral infection could have a similar appearance. Recommend clinical correlation. *Opacity in the left suprahilar region is a little more focal when compared November 05, 2015. The  difference could be due to confluence of shadows. Recommend follow-up to resolution. Electronically Signed   By: Dorise Bullion III M.D   On: 11/09/2015 09:14   Dg Chest Port 1 View  Result Date: 11/06/2015 CLINICAL DATA:  75 year old male with increasing shortness of breath and abnormal left side pulmonary auscultation. Initial encounter. EXAM: PORTABLE CHEST 1 VIEW COMPARISON:  0508 hours today and earlier. FINDINGS: Portable AP semi upright view at 2127 hours. Continued and mildly increased perihilar and basilar predominant interstitial pulmonary opacity. Stable cardiac size and mediastinal contours. No pneumothorax. No pleural effusion or consolidation identified. IMPRESSION: Increased bilateral pulmonary interstitial opacity. Top differential considerations include progression of interstitial edema versus viral/ atypical respiratory infection. No pleural effusion identified. Electronically  Signed   By: Genevie Ann M.D.   On: 11/06/2015 21:42   Dg Chest Port 1 View  Result Date: 11/06/2015 CLINICAL DATA:  75 year old male with aspiration of liquid. EXAM: PORTABLE CHEST 1 VIEW COMPARISON:  Chest radiograph dated 11/05/2015 FINDINGS: Mild cardiomegaly with central vascular congestion. Bibasilar Interstitial prominence and nodular densities, new from prior study may represent atelectatic changes versus infiltrate. There is no pleural effusion or pneumothorax. Top-normal cardiac silhouette. No acute osseous pathology. IMPRESSION: Mild congestive changes with bibasilar subsegmental atelectasis versus infiltrate. Electronically Signed   By: Anner Crete M.D.   On: 11/06/2015 05:16   Dg Chest Port 1 View  Result Date: 11/05/2015 CLINICAL DATA:  Shortness of breath EXAM: PORTABLE CHEST 1 VIEW COMPARISON:  March 14, 2015 FINDINGS: The lungs are clear. Heart is mildly enlarged with pulmonary vascularity within normal limits. No adenopathy. No bone lesions. IMPRESSION: Mild cardiac enlargement.  No edema or  consolidation. Electronically Signed   By: Lowella Grip III M.D.   On: 11/05/2015 09:42   Dg Foot Complete Left  Result Date: 10/28/2015 CLINICAL DATA:  Left big toe pain. EXAM: LEFT FOOT - COMPLETE 3+ VIEW COMPARISON:  None. FINDINGS: There is no evidence of fracture or dislocation. No evidence of dystrophic bony changes. There is soft tissue swelling of the distal first digit. Punctate lucencies within the area of soft tissue swelling may represent entrapped fat or potentially foci of gas. Heavy vascular calcifications are seen. IMPRESSION: No evidence of fracture of the left foot. Soft tissue swelling of the distal first digit with punctate lucencies within the area of swelling, which may represent entrapped fat or potentially foci of gas. Please correlate clinically regarding the possibility of cellulitis. Electronically Signed   By: Fidela Salisbury M.D.   On: 10/28/2015 21:01   Time Spent in minutes  30   Barton Dubois M.D on 11/16/2015 at 2:42 PM [phone]

## 2015-11-16 NOTE — Progress Notes (Signed)
   Dr Emily Filbert note reviewed, no new recommendations. Please call over weekend with questions. Fluid removal for her diastolic HF per nephrology and hemodyalysis.   Zandra Abts MD

## 2015-11-16 NOTE — Progress Notes (Signed)
ANTICOAGULATION CONSULT NOTE - Follow Up Consult  Pharmacy Consult for Heparin/Coumadin Indication: afib   Allergies  Allergen Reactions  . Tape Other (See Comments)    SKIN WILL TEAR!!    Patient Measurements: Height: 5\' 9"  (175.3 cm) Weight: 230 lb 9.6 oz (104.6 kg) IBW/kg (Calculated) : 70.7  Vital Signs: Temp: 97.5 F (36.4 C) (08/12 1405) Temp Source: Oral (08/12 1405) BP: 124/64 (08/12 1500) Pulse Rate: 64 (08/12 1500)  Labs:  Recent Labs  11/14/15 0430  11/14/15 1845 11/15/15 0303 11/15/15 1153 11/15/15 1313 11/16/15 0415  HGB 10.5*  --  10.7*  --   --  10.1*  --   HCT 32.0*  --  32.1*  --   --  29.9*  --   PLT 296  --  304  --   --  267  --   LABPROT 35.0*  --   --  33.0*  --   --  25.6*  INR 3.38  --   --  3.14  --   --  2.29  CREATININE 4.53*  < >  --   --  4.16* 4.09* 3.40*  < > = values in this interval not displayed.  Estimated Creatinine Clearance: 22.4 mL/min (by C-G formula based on SCr of 3.4 mg/dL).   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 PTA warfarin restarted 8/4. INR currently therapeutic at 2.29.  INR 3.38>3.14>2.29, therapeutic today.  Warfarin last given on 8/55m  5mg  dose, INR remained > 3 from 8/6-8/11/17.  **Warfarin PTA dosing: 5mg  daily except 2.5mg  on Tuesdays and Thursdays.    Goal of Therapy:  INR 2-3 Monitor platelets by anticoagulation protocol: Yes    Plan:  -Warfarin 2mg  tonight - Daily INR  Nicole Cella, RPh Clinical Pharmacist Pager: [phone] 11/16/2015 4:05 PM

## 2015-11-17 LAB — RENAL FUNCTION PANEL
Albumin: 2.5 g/dL — ABNORMAL LOW (ref 3.5–5.0)
Anion gap: 6 (ref 5–15)
BUN: 34 mg/dL — AB (ref 6–20)
CALCIUM: 7.8 mg/dL — AB (ref 8.9–10.3)
CO2: 26 mmol/L (ref 22–32)
CREATININE: 3.7 mg/dL — AB (ref 0.61–1.24)
Chloride: 97 mmol/L — ABNORMAL LOW (ref 101–111)
GFR calc Af Amer: 17 mL/min — ABNORMAL LOW (ref >60)
GFR calc non Af Amer: 15 mL/min — ABNORMAL LOW (ref >60)
GLUCOSE: 237 mg/dL — AB (ref 65–99)
Phosphorus: 4.7 mg/dL — ABNORMAL HIGH (ref 2.5–4.6)
Potassium: 3.9 mmol/L (ref 3.5–5.1)
SODIUM: 129 mmol/L — AB (ref 135–145)

## 2015-11-17 LAB — GLUCOSE, CAPILLARY
GLUCOSE-CAPILLARY: 130 mg/dL — AB (ref 65–99)
GLUCOSE-CAPILLARY: 222 mg/dL — AB (ref 65–99)
GLUCOSE-CAPILLARY: 210 mg/dL — AB (ref 65–99)

## 2015-11-17 LAB — HEPARIN LEVEL (UNFRACTIONATED): HEPARIN UNFRACTIONATED: 0.45 [IU]/mL (ref 0.30–0.70)

## 2015-11-17 LAB — PROTIME-INR
INR: 1.76
PROTHROMBIN TIME: 20.8 s — AB (ref 11.4–15.2)

## 2015-11-17 MED ORDER — HEPARIN (PORCINE) IN NACL 100-0.45 UNIT/ML-% IJ SOLN
1200.0000 [IU]/h | INTRAMUSCULAR | Status: DC
  Administered 2015-11-17: 1400 [IU]/h via INTRAVENOUS
  Administered 2015-11-18: 1200 [IU]/h via INTRAVENOUS
  Filled 2015-11-17 (×2): qty 250

## 2015-11-17 NOTE — Progress Notes (Addendum)
[facility] Progress Note  Subjective    Wanting to get out of bed more. Otherwise, no complaints.   Objective Vitals:   11/16/15 2058 11/17/15 0412  BP: (!) 143/69 (!) 129/48  Pulse: 74 (!) 50  Resp: 18 20  Temp: 97.7 F (36.5 C) 97.7 F (36.5 C)    Intake/Output Summary (Last 24 hours) at 11/17/15 1255 Last data filed at 11/16/15 2228  Gross per 24 hour  Intake              240 ml  Output             3135 ml  Net            -2895 ml   Left great toe is clean with minimal granulation tissue at base and fibrinous material at perimeter. No purulence.   Assessment/Planning: 75 y.o. male is s/p:  Angioplasty left anterior tibial artery, atherectomy and drug coated balloon angioplasty, left popliteal artery POD 12 , left 1st great toe amputation POD 11.   Left great toe amputation site is fair. Hopefully this will heal.  Continue dressing changes. Discussed with patient that there is potential his wound may not heal.   AKI w/ history of renal transplant: Will need TDC next week and permanent access. Vein mapping shows adequate left cephalic vein. Hold coumadin and continue heparin. Can schedule for Tuesday if INR is corrected.      Mark [name] 11/17/2015 12:55 PM --  Laboratory CBC    Component Value Date/Time   WBC 7.8 11/16/2015 1443   HGB 10.3 (L) 11/16/2015 1443   HCT 30.4 (L) 11/16/2015 1443   PLT 246 11/16/2015 1443    BMET    Component Value Date/Time   NA 134 (L) 11/16/2015 0415   K 3.9 11/16/2015 0415   CL 97 (L) 11/16/2015 0415   CO2 27 11/16/2015 0415   GLUCOSE 124 (H) 11/16/2015 0415   BUN 43 (H) 11/16/2015 0415   CREATININE 3.40 (H) 11/16/2015 0415   CALCIUM 8.0 (L) 11/16/2015 0415   GFRNONAA 16 (L) 11/16/2015 0415   GFRAA 19 (L) 11/16/2015 0415    COAG Lab Results  Component Value Date   INR 1.76 11/17/2015   INR 2.29 11/16/2015   INR 3.14 11/15/2015   No results found for: PTT  Antibiotics Anti-infectives      Start     Dose/Rate Route Frequency Ordered Stop   11/06/15 1115  ceFAZolin (ANCEF) IVPB 1 g/50 mL premix  Status:  Discontinued    Comments:  Send with pt to OR   1 g 100 mL/hr over 30 Minutes Intravenous On call 11/05/15 2346 11/06/15 1632   10/29/15 2200  vancomycin (VANCOCIN) IVPB 1000 mg/200 mL premix  Status:  Discontinued     1,000 mg 200 mL/hr over 60 Minutes Intravenous Every 24 hours 10/28/15 2154 11/07/15 1058   10/28/15 2200  piperacillin-tazobactam (ZOSYN) IVPB 3.375 g  Status:  Discontinued     3.375 g 12.5 mL/hr over 240 Minutes Intravenous Every 8 hours 10/28/15 2120 11/09/15 1057   10/28/15 2200  vancomycin (VANCOCIN) 2,500 mg in sodium chloride 0.9 % 500 mL IVPB     2,500 mg 250 mL/hr over 120 Minutes Intravenous  Once 10/28/15 2154 10/29/15 [place], PA-C [facility] Office: [phone] Pager: [phone] 11/17/2015 12:55 PM  As above, patient more alert today than Friday. Toe amp site will hopefully heal. Will  plan tdc and avf vs avg this week when inr corrected.   Mark Province C. Donzetta Matters, MD [facility] [place] Office: [phone] Pager: [phone]

## 2015-11-17 NOTE — Progress Notes (Signed)
ANTICOAGULATION CONSULT NOTE - Follow Up Consult  Pharmacy Consult for Heparin/Coumadin Indication: afib   Allergies  Allergen Reactions  . Tape Other (See Comments)    SKIN WILL TEAR!!    Patient Measurements: Height: 5\' 9"  (175.3 cm) Weight: 223 lb 5.2 oz (101.3 kg) IBW/kg (Calculated) : 70.7  HDWt: 92kg  Vital Signs: Temp: 97.7 F (36.5 C) (08/13 0412) Temp Source: Oral (08/13 0412) BP: 129/48 (08/13 0412) Pulse Rate: 50 (08/13 0412)  Labs:  Recent Labs  11/14/15 1845 11/15/15 0303  11/15/15 1313 11/16/15 0415 11/16/15 1443 11/17/15 1203  HGB 10.7*  --   --  10.1*  --  10.3*  --   HCT 32.1*  --   --  29.9*  --  30.4*  --   PLT 304  --   --  267  --  246  --   LABPROT  --  33.0*  --   --  25.6*  --  20.8*  INR  --  3.14  --   --  2.29  --  1.76  CREATININE  --   --   < > 4.09* 3.40*  --  3.70*  < > = values in this interval not displayed.  Estimated Creatinine Clearance: 20.2 mL/min (by C-G formula based on SCr of 3.7 mg/dL).   Assessment: [name] w/ warfarin PTA for AFib. Now s/p arteriogram 8/1 with successful arthrectomy and balloon angioplasty. Heparin restarted following left great toe amputation 8/2 and d/c'd 8/6. Only on warfarin now. INR previously supratherapeutic, now down to 1.76 after warfarin restarted 8/12.  Needs TDC and permanent access next week. Per Vascular recommendations, hold warfarin and start heparin 8/13. Hg low stable, plt wnl, no bleed documented.  Warfarin PTA dosing: 5mg  daily except 2.5mg  on Tuesdays and Thursdays.   Goal of Therapy:  Heparin level 0.3-0.7 units/ml Monitor platelets by anticoagulation protocol: Yes    Plan:  -Start heparin at 1400 units/h (no bolus) -8h HL, daily HL/CBC -Monitor s/sx bleeding -Hold warfarin for now  Elicia Lamp, PharmD, [facility] Clinical Pharmacist Pager [phone] 11/17/2015 1:26 PM

## 2015-11-17 NOTE — Progress Notes (Signed)
Patient ID: Mark [name], male   DOB: Feb 10, 1941, 75 y.o.   MRN: [id number]                                                                PROGRESS NOTE                                                                                                                                                                                                             Patient Demographics:    Mark [name], is a 75 y.o. male, DOB - Aug 29, 1940, [id number]  Admit date - 10/28/2015   Admitting Physician Etta Quill, DO  Outpatient Primary MD for the patient is Rochel Brome, MD  LOS - 20  Outpatient Specialists:   Chief Complaint  Patient presents with  . 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 Patient presents to ED with c/o 'his toe is dead'. Creatinine baseline appears to be 1.8-2.3.  Primary nephrologist is Dr. Justin Mend.  Saw him on 06/26/2015 with BUN 27, creatinine 1.88.  Admitted on 10/28/2015 with necrotic left great toe with cellulitis.  Creatinine on admission was 2.6.  Seen by [name] in consult on 7/25.  His Prograf level was found to be low and was increased.  Patient underwent abdominal aortogram with angioplasty of the left anterior tibial artery and left popliteal artery on 11/05/2015.  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  He was on Vancomycin and Zosyn from 7/24-8/3.  Currently only on Zosyn.  He developed some dyspnea in the setting of being positive about 13 liters since admission with evidence of volume overload on exam. Worsening renal failure. Placement of temporary hemodialysis catheter on 8/10. Now started on HD. Will follow clinical response and renal function recovery.   Subjective:    Mark [name]  With flat affect, in no distress. Reports breathing has remained stable. Complaining of still significant diarrhea.  Deneis palpitations, CP, HA's or any other complaints. Continue to have anasarca, even improved with hemodialysis treatment.   Assessment  & Plan :    Principal Problem:   Gangrene of toe (HCC) Active Problems:   Type II diabetes  mellitus, uncontrolled ([place])   Renal transplant recipient   Diabetic ulcer of left great toe ([place])   Cellulitis of great toe of left foot   Aspiration of liquid   SOB (shortness of breath)   Permanent atrial fibrillation (HCC)   Bibasilar crackles   CHF (congestive heart failure) (HCC)   Melena   Renal failure   Acute on chronic diastolic heart failure -Unable to diurese with the help of diuretics; renal function worsen and has required HD now. -cardiology and nephrology were managing diuretics/fluid overload status with HD now -2-D echo did not show any wall motion abnormalities and the patient does not appear to have any ischemic changes on his EKG -Renal allograft function worse today with Creatinine 3.7 -Per nephrology rec's will continue HD for now -patient is still oliguric -will follow daily weights and strict intake and output  Gangrene of left  Toe/Toe Amputation: -S /P atherectomy and DCB Left popliteal artery and PTA left ATA (POD 5) -S/P Ray amputation of left great toe (POD 4) -Palpable Left popliteal pulse; wound is healing properly -vascular surgery on board. -patient has completed antibiotic therapy  Anemia of chronic diseases,  -FOBT positive, but no signs of acute bleeding appreciated -with ongoing hemodilution from anasarca/fluid overload, Hgb has remained stable around 10.5 for several days.  -Continue PPI twice a day -follow Hgb trend   Diarrhea-C. difficile negative 2 during this admission -will continue probiotic -PRN lomotil ordered  -No fever, no nausea, no vomiting, no abdominal pain. -symptoms improved according to patient   Acute on Chronic kidney disease stage IV, status post renal transplant , baseline creatinine  around 1.7, now up to 4.9 and with oliguria -thought to be acute process caused by contrast  -requiring HD -will continue tacrolimus -Had vein mapping today (anticipation of potential remanent dialysis catheter next couple days and possible AV access) -renal service on board, will follow rec's  A fib with aberrancy -  -continue b-blocker for rate controlled -on coumadin for anticoagulation (dose per pharmacy); will be on hold and only using heparin for anticipation of ANF/TDC placement in next couple days. -cardiology on board, will follow rec's -CHADsVASC score 4  DM2 -  uncontrolled -Hemoglobin A1c 8.2 -will continue SSI, meal coverage and lantus  Hypertension: uncontrolled -Continue norvasc and metoprolol -Prn hydralazine also ordered -HD will also help with BP  Hypothyroidism -continue Synthroid  Protein Calorie Malnutrition -will continue Prostat -albumin 2.1 -nutritional service consulted   Dyslipidemia-continue Crestor  Pulmonary hypertension:  Mark [name] has moderate pulmonary hypertension.    -Cardiology Recommending outpatient sleep study and PFTs.   -He does have grade 2 diastolic dysfunction and will benefit of volume control -currently needing HD -will follow cardiology and renal service rec's  Code Status : FULL CODE  Family Communication  :   Disposition Plan  :  ? Will likely need SNF at discharge; will follow PT evaluation.  Barriers For Discharge : worsening renal function, and the patient on dialysis now  Consults  :  Vascular surgery, nephrology, cardiology  Procedures  : Atherectomy and drug coated balloon angioplasty 8/1 , left ray amputation 8/2  DVT Prophylaxis  :  Heparin     Lab Results  Component Value Date   PLT 246 11/16/2015    Antibiotics  :     10/28/15 2200  piperacillin-tazobactam (ZOSYN) IVPB 3.375 g  Status:  Discontinued     3.375 g 12.5 mL/hr over 240 Minutes Intravenous Every 8 hours 10/28/15 2120 11/09/15  1057   10/28/15 2200  vancomycin (VANCOCIN) 2,500 mg in sodium chloride 0.9 % 500 mL IVPB     2,500 mg 250 mL/hr over 120 Minutes Intravenous  Once 10/28/15 2154 10/29/15 2330        Objective:   Vitals:   11/16/15 1730 11/16/15 1745 11/16/15 2058 11/17/15 0412  BP: 122/62 124/66 (!) 143/69 (!) 129/48  Pulse: (!) 54 (!) 59 74 (!) 50  Resp:  19 18 20   Temp:  97.6 F (36.4 C) 97.7 F (36.5 C) 97.7 F (36.5 C)  TempSrc:  Oral Oral Oral  SpO2:  97% 96% 97%  Weight:  101.3 kg (223 lb 5.2 oz)    Height:        Wt Readings from Last 3 Encounters:  11/16/15 101.3 kg (223 lb 5.2 oz)  09/05/15 103.1 kg (227 lb 3.2 oz)  08/05/15 101.8 kg (224 lb 6.4 oz)     Intake/Output Summary (Last 24 hours) at 11/17/15 1446 Last data filed at 11/16/15 2228  Gross per 24 hour  Intake              240 ml  Output             3135 ml  Net            -2895 ml     Physical Exam General: AAOX3, in no acute distress. Denies CP and reports breathing is ok. Normal affect.  Patient continued to be oliguric. Anasarca state improving with HD. Head/eyes: [place]/AT,PERRLA, no icterus Neck:Supple Neck, right IJ HD catheter in place; No cervical lymphadenopathy appriciated.  Lungs: Symmetrical Chest wall movement, Good air movement bilaterally, decrease breath sounds at bases, no wheezing  Heart: rate controlled. Positive A. Fib on telemetry, no rubs or gallops Abdomen: +ve B.Sounds, Abd Soft, No tenderness, No organomegaly appriciated, No rebound - guarding or rigidity. Extremities: Diffuse anasarca, 2-3+ pitting edema throughout; left ray amputation, with clean dressing and just minimal serosanguineous drainage.   Data Review:    CBC  Recent Labs Lab 11/13/15 1357 11/14/15 0430 11/14/15 1845 11/15/15 1313 11/16/15 1443  WBC 7.7 9.9 7.7 8.2 7.8  HGB 10.2* 10.5* 10.7* 10.1* 10.3*  HCT 30.9* 32.0* 32.1* 29.9* 30.4*  PLT 279 296 304 267 246  MCV 96.0 95.2 94.7 95.8 95.9  MCH 31.7 31.3 31.6 32.4  32.5  MCHC 33.0 32.8 33.3 33.8 33.9  RDW 16.4* 16.6* 16.9* 16.9* 16.9*    Chemistries   Recent Labs Lab 11/11/15 0314  11/14/15 0430 11/14/15 1817 11/15/15 1153 11/15/15 1313 11/16/15 0415 11/17/15 1203  NA 138  < > 135 132* 134* 133* 134* 129*  K 4.6  < > 4.4 4.8 4.2 4.1 3.9 3.9  CL 109  < > 103 102 99* 99* 97* 97*  CO2 21*  < > 21* 20* 24 24 27 26   GLUCOSE 163*  < > 209* 210* 143* 140* 124* 237*  BUN 72*  < > 109* 116* 72* 72* 43* 34*  CREATININE 3.06*  < > 4.53* 4.95* 4.16* 4.09* 3.40* 3.70*  CALCIUM 8.8*  < > 8.7* 8.6* 8.2* 8.0* 8.0* 7.8*  AST 17  --  20  --   --   --   --   --   ALT 16*  --  19  --   --   --   --   --   ALKPHOS 101  --  116  --   --   --   --   --  BILITOT 0.8  --  0.8  --   --   --   --   --   < > = values in this interval not displayed. ------------------------------------------------------------------------------------------------------------------ No results for input(s): CHOL, HDL, LDLCALC, TRIG, CHOLHDL, LDLDIRECT in the last 72 hours.  Lab Results  Component Value Date   HGBA1C 8.2 (H) 10/29/2015   ------------------------------------------------------------------------------------------------------------------ No results for input(s): TSH, T4TOTAL, T3FREE, THYROIDAB in the last 72 hours.  Invalid input(s): FREET3 ------------------------------------------------------------------------------------------------------------------ No results for input(s): VITAMINB12, FOLATE, FERRITIN, TIBC, IRON, RETICCTPCT in the last 72 hours.  Coagulation profile  Recent Labs Lab 11/13/15 0802 11/14/15 0430 11/15/15 0303 11/16/15 0415 11/17/15 1203  INR 3.37 3.38 3.14 2.29 1.76    Cardiac Enzymes No results for input(s): CKMB, TROPONINI, MYOGLOBIN in the last 168 hours.  Invalid input(s): CK ------------------------------------------------------------------------------------------------------------------ No results found for: BNP  Inpatient  Medications  Scheduled Meds: . allopurinol  150 mg Oral Daily  . aspirin  81 mg Oral Daily  . calcitRIOL  0.5 mcg Oral Daily  . docusate sodium  100 mg Oral Daily  . famotidine  20 mg Oral QHS  . feeding supplement  1 Container Oral TID WC  . hydrALAZINE  25 mg Oral Q8H  . insulin aspart  0-15 Units Subcutaneous TID WC  . insulin aspart  5 Units Subcutaneous TID WC  . insulin glargine  20 Units Subcutaneous QHS  . levothyroxine  137 mcg Oral QAC breakfast  . magnesium oxide  400 mg Oral BID  . metoprolol succinate  100 mg Oral Daily  . pantoprazole  40 mg Oral BID  . predniSONE  5 mg Oral Daily  . rosuvastatin  10 mg Oral QPM  . saccharomyces boulardii  250 mg Oral BID  . tacrolimus  1 mg Oral BID   Continuous Infusions: . heparin     PRN Meds:.alum & mag hydroxide-simeth, diphenoxylate-atropine, guaiFENesin-dextromethorphan, heparin, hydrALAZINE, HYDROcodone-acetaminophen, labetalol, levalbuterol, lidocaine, metoprolol, ondansetron (ZOFRAN) IV, phenol  Micro Results Recent Results (from the past 240 hour(s))  C difficile quick scan w PCR reflex     Status: None   Collection Time: 11/09/15  4:08 PM  Result Value Ref Range Status   C Diff antigen NEGATIVE NEGATIVE Final   C Diff toxin NEGATIVE NEGATIVE Final   C Diff interpretation No C. difficile detected.  Final    Radiology Reports Ir Fluoro Guide Cv Line Right  Result Date: 11/14/2015 INDICATION: Acute kidney injury and needs hemodialysis. Plan for placement of non tunneled catheter due to elevated INR level. EXAM: FLUOROSCOPIC AND ULTRASOUND GUIDED PLACEMENT OF A NON TUNNELED DIALYSIS CATHETER Physician: Stephan Minister. Henn, MD MEDICATIONS: None ANESTHESIA/SEDATION: None FLUOROSCOPY TIME:  Fluoroscopy Time: 24 seconds COMPLICATIONS: None immediate. PROCEDURE: Informed consent was obtained for placement of a non tunneled dialysis catheter. The patient was placed supine on the interventional table. Ultrasound confirmed a patent  right internal jugular vein. Ultrasound images were obtained for documentation. The right side of the neck was prepped and draped in a sterile fashion. The right neck was anesthetized with 1% lidocaine. Maximal barrier sterile technique was utilized including caps, mask, sterile gowns, sterile gloves, sterile drape, hand hygiene and skin antiseptic. A small incision was made with #11 blade scalpel. A 21 gauge needle directed into the right internal jugular vein with ultrasound guidance. A micropuncture dilator set was placed. 20 cm Mahurkar catheter was selected. J wire was advanced into the IVC. Tract was dilated to accommodate the catheter. Catheter tip placed near the  superior cavoatrial junction. All the lumens aspirated and flushed well. Appropriate amount of heparin was placed in the dialysis lumens. Catheter was sutured to the skin with Prolene suture. Fluoroscopic and ultrasound images were taken and saved for documentation. FINDINGS: Patent right internal jugular vein. Catheter tip near the superior cavoatrial junction. IMPRESSION: Successful placement of a non tunneled dialysis catheter using ultrasound and fluoroscopic guidance. Electronically Signed   By: Markus Daft M.D.   On: 11/14/2015 19:52   Ir US Guide Vasc Access Right  Result Date: 11/14/2015 INDICATION: Acute kidney injury and needs hemodialysis. Plan for placement of non tunneled catheter due to elevated INR level. EXAM: FLUOROSCOPIC AND ULTRASOUND GUIDED PLACEMENT OF A NON TUNNELED DIALYSIS CATHETER Physician: Stephan Minister. Henn, MD MEDICATIONS: None ANESTHESIA/SEDATION: None FLUOROSCOPY TIME:  Fluoroscopy Time: 24 seconds COMPLICATIONS: None immediate. PROCEDURE: Informed consent was obtained for placement of a non tunneled dialysis catheter. The patient was placed supine on the interventional table. Ultrasound confirmed a patent right internal jugular vein. Ultrasound images were obtained for documentation. The right side of the neck was  prepped and draped in a sterile fashion. The right neck was anesthetized with 1% lidocaine. Maximal barrier sterile technique was utilized including caps, mask, sterile gowns, sterile gloves, sterile drape, hand hygiene and skin antiseptic. A small incision was made with #11 blade scalpel. A 21 gauge needle directed into the right internal jugular vein with ultrasound guidance. A micropuncture dilator set was placed. 20 cm Mahurkar catheter was selected. J wire was advanced into the IVC. Tract was dilated to accommodate the catheter. Catheter tip placed near the superior cavoatrial junction. All the lumens aspirated and flushed well. Appropriate amount of heparin was placed in the dialysis lumens. Catheter was sutured to the skin with Prolene suture. Fluoroscopic and ultrasound images were taken and saved for documentation. FINDINGS: Patent right internal jugular vein. Catheter tip near the superior cavoatrial junction. IMPRESSION: Successful placement of a non tunneled dialysis catheter using ultrasound and fluoroscopic guidance. Electronically Signed   By: Markus Daft M.D.   On: 11/14/2015 19:52   Dg Chest Port 1 View  Result Date: 11/09/2015 CLINICAL DATA:  Shortness of breath EXAM: PORTABLE CHEST 1 VIEW COMPARISON:  November 06, 2015 FINDINGS: Mild cardiomegaly. The hila and mediastinum are unchanged. Diffuse bilateral interstitial opacities again identified and similar in the interval. The opacity is a little more focal in the left suprahilar region which is increased since November 05, 2015 but unchanged since November 06, 2015. No other acute interval changes. There may be tiny bilateral effusions with blunting of the costophrenic angles. IMPRESSION: 1. Suspected tiny bilateral pleural effusions and diffuse increased interstitial markings are most consistent with pulmonary edema. Atypical viral infection could have a similar appearance. Recommend clinical correlation. *Opacity in the left suprahilar region is a  little more focal when compared November 05, 2015. The difference could be due to confluence of shadows. Recommend follow-up to resolution. Electronically Signed   By: Dorise Bullion III M.D   On: 11/09/2015 09:14   Dg Chest Port 1 View  Result Date: 11/06/2015 CLINICAL DATA:  75 year old male with increasing shortness of breath and abnormal left side pulmonary auscultation. Initial encounter. EXAM: PORTABLE CHEST 1 VIEW COMPARISON:  0508 hours today and earlier. FINDINGS: Portable AP semi upright view at 2127 hours. Continued and mildly increased perihilar and basilar predominant interstitial pulmonary opacity. Stable cardiac size and mediastinal contours. No pneumothorax. No pleural effusion or consolidation identified. IMPRESSION: Increased bilateral pulmonary interstitial opacity. Top  differential considerations include progression of interstitial edema versus viral/ atypical respiratory infection. No pleural effusion identified. Electronically Signed   By: Genevie Ann M.D.   On: 11/06/2015 21:42   Dg Chest Port 1 View  Result Date: 11/06/2015 CLINICAL DATA:  75 year old male with aspiration of liquid. EXAM: PORTABLE CHEST 1 VIEW COMPARISON:  Chest radiograph dated 11/05/2015 FINDINGS: Mild cardiomegaly with central vascular congestion. Bibasilar Interstitial prominence and nodular densities, new from prior study may represent atelectatic changes versus infiltrate. There is no pleural effusion or pneumothorax. Top-normal cardiac silhouette. No acute osseous pathology. IMPRESSION: Mild congestive changes with bibasilar subsegmental atelectasis versus infiltrate. Electronically Signed   By: Anner Crete M.D.   On: 11/06/2015 05:16   Dg Chest Port 1 View  Result Date: 11/05/2015 CLINICAL DATA:  Shortness of breath EXAM: PORTABLE CHEST 1 VIEW COMPARISON:  March 14, 2015 FINDINGS: The lungs are clear. Heart is mildly enlarged with pulmonary vascularity within normal limits. No adenopathy. No bone lesions.  IMPRESSION: Mild cardiac enlargement.  No edema or consolidation. Electronically Signed   By: Lowella Grip III M.D.   On: 11/05/2015 09:42   Dg Foot Complete Left  Result Date: 10/28/2015 CLINICAL DATA:  Left big toe pain. EXAM: LEFT FOOT - COMPLETE 3+ VIEW COMPARISON:  None. FINDINGS: There is no evidence of fracture or dislocation. No evidence of dystrophic bony changes. There is soft tissue swelling of the distal first digit. Punctate lucencies within the area of soft tissue swelling may represent entrapped fat or potentially foci of gas. Heavy vascular calcifications are seen. IMPRESSION: No evidence of fracture of the left foot. Soft tissue swelling of the distal first digit with punctate lucencies within the area of swelling, which may represent entrapped fat or potentially foci of gas. Please correlate clinically regarding the possibility of cellulitis. Electronically Signed   By: Fidela Salisbury M.D.   On: 10/28/2015 21:01   Time Spent in minutes  30   Barton Dubois M.D on 11/17/2015 at 2:46 PM [phone]

## 2015-11-17 NOTE — Progress Notes (Signed)
Assessment/Plan:  Renal transplant (2005) with AKI on CKD4 post contrast administration - Baseline creatinine 1.88-2, currently  dialysis dependent PVD/Toe Amp/Cellulitis;s/p angioplasty/atherectomy AFib Vol XS, improving  Plan: Will support with dialysis as long as appropriate.   Might do a 3 month trial for AKI with hope for recovery and continue all anti-rejection meds; then assume ESRD TDC next week and AV access Tuesday HD Monday for more volume.  Subjective: Interval History: weight down with HD, breaths better  Objective: Vital signs in last 24 hours: Temp:  [97.5 F (36.4 C)-97.7 F (36.5 C)] 97.7 F (36.5 C) (08/13 0412) Pulse Rate:  [48-74] 50 (08/13 0412) Resp:  [17-20] 20 (08/13 0412) BP: (93-143)/(33-75) 129/48 (08/13 0412) SpO2:  [96 %-97 %] 97 % (08/13 0412) Weight:  [101.3 kg (223 lb 5.2 oz)-104.6 kg (230 lb 9.6 oz)] 101.3 kg (223 lb 5.2 oz) (08/12 1745) Weight change: -1.6 kg (-3 lb 8.4 oz)  Intake/Output from previous day: 08/12 0701 - 08/13 0700 In: 240 [P.O.:240] Out: 3135  Intake/Output this shift: No intake/output data recorded.  General appearance: alert and cooperative Resp: clear to auscultation bilaterally Extremities: bandaged left foot/toe and 2-3+ edema on LLE, 1-2+ edema RLE  Lab Results:  Recent Labs  11/15/15 1313 11/16/15 1443  WBC 8.2 7.8  HGB 10.1* 10.3*  HCT 29.9* 30.4*  PLT 267 246   BMET:  Recent Labs  11/16/15 0415 11/17/15 1203  NA 134* 129*  K 3.9 3.9  CL 97* 97*  CO2 27 26  GLUCOSE 124* 237*  BUN 43* 34*  CREATININE 3.40* 3.70*  CALCIUM 8.0* 7.8*   No results for input(s): PTH in the last 72 hours. Iron Studies: No results for input(s): IRON, TIBC, TRANSFERRIN, FERRITIN in the last 72 hours. Studies/Results: No results found.  Scheduled: . allopurinol  150 mg Oral Daily  . aspirin  81 mg Oral Daily  . calcitRIOL  0.5 mcg Oral Daily  . docusate sodium  100 mg Oral Daily  . famotidine  20 mg Oral QHS   . feeding supplement  1 Container Oral TID WC  . hydrALAZINE  25 mg Oral Q8H  . insulin aspart  0-15 Units Subcutaneous TID WC  . insulin aspart  5 Units Subcutaneous TID WC  . insulin glargine  20 Units Subcutaneous QHS  . levothyroxine  137 mcg Oral QAC breakfast  . magnesium oxide  400 mg Oral BID  . metoprolol succinate  100 mg Oral Daily  . pantoprazole  40 mg Oral BID  . predniSONE  5 mg Oral Daily  . rosuvastatin  10 mg Oral QPM  . saccharomyces boulardii  250 mg Oral BID  . tacrolimus  1 mg Oral BID  . Warfarin - Pharmacist Dosing Inpatient   Does not apply q1800     LOS: 20 days   Mark [name] C 11/17/2015,1:26 PM

## 2015-11-17 NOTE — Progress Notes (Signed)
ANTICOAGULATION CONSULT NOTE - Follow Up Consult  Pharmacy Consult for heparin Indication: atrial fibrillation  Labs:  Recent Labs  11/15/15 0303  11/15/15 1313 11/16/15 0415 11/16/15 1443 11/17/15 1203 11/17/15 2209  HGB  --   --  10.1*  --  10.3*  --   --   HCT  --   --  29.9*  --  30.4*  --   --   PLT  --   --  267  --  246  --   --   LABPROT 33.0*  --   --  25.6*  --  20.8*  --   INR 3.14  --   --  2.29  --  1.76  --   HEPARINUNFRC  --   --   --   --   --   --  0.45  CREATININE  --   < > 4.09* 3.40*  --  3.70*  --   < > = values in this interval not displayed.   Assessment/Plan:  75yo male therapeutic on heparin with initial dosing while Coumadin on hold. Will continue gtt at current rate and confirm stable with am labs.   Wynona Neat, PharmD, BCPS  11/17/2015,10:57 PM

## 2015-11-18 DIAGNOSIS — N185 Chronic kidney disease, stage 5: Secondary | ICD-10-CM

## 2015-11-18 LAB — RENAL FUNCTION PANEL
ANION GAP: 9 (ref 5–15)
Albumin: 2.3 g/dL — ABNORMAL LOW (ref 3.5–5.0)
BUN: 42 mg/dL — AB (ref 6–20)
CHLORIDE: 98 mmol/L — AB (ref 101–111)
CO2: 25 mmol/L (ref 22–32)
Calcium: 8 mg/dL — ABNORMAL LOW (ref 8.9–10.3)
Creatinine, Ser: 4.05 mg/dL — ABNORMAL HIGH (ref 0.61–1.24)
GFR, EST AFRICAN AMERICAN: 15 mL/min — AB (ref >60)
GFR, EST NON AFRICAN AMERICAN: 13 mL/min — AB (ref >60)
Glucose, Bld: 109 mg/dL — ABNORMAL HIGH (ref 65–99)
POTASSIUM: 3.8 mmol/L (ref 3.5–5.1)
Phosphorus: 5 mg/dL — ABNORMAL HIGH (ref 2.5–4.6)
Sodium: 132 mmol/L — ABNORMAL LOW (ref 135–145)

## 2015-11-18 LAB — GLUCOSE, CAPILLARY
GLUCOSE-CAPILLARY: 71 mg/dL (ref 65–99)
GLUCOSE-CAPILLARY: 93 mg/dL (ref 65–99)
GLUCOSE-CAPILLARY: 108 mg/dL — AB (ref 65–99)
Glucose-Capillary: 170 mg/dL — ABNORMAL HIGH (ref 65–99)
Glucose-Capillary: 183 mg/dL — ABNORMAL HIGH (ref 65–99)

## 2015-11-18 LAB — CBC
HEMATOCRIT: 32.8 % — AB (ref 39.0–52.0)
HEMOGLOBIN: 10.8 g/dL — AB (ref 13.0–17.0)
MCH: 30.9 pg (ref 26.0–34.0)
MCHC: 32.9 g/dL (ref 30.0–36.0)
MCV: 94 fL (ref 78.0–100.0)
Platelets: 190 10*3/uL (ref 150–400)
RBC: 3.49 MIL/uL — AB (ref 4.22–5.81)
RDW: 17.2 % — ABNORMAL HIGH (ref 11.5–15.5)
WBC: 9.5 10*3/uL (ref 4.0–10.5)

## 2015-11-18 LAB — HEPARIN LEVEL (UNFRACTIONATED)
HEPARIN UNFRACTIONATED: 1.18 [IU]/mL — AB (ref 0.30–0.70)
Heparin Unfractionated: 0.78 IU/mL — ABNORMAL HIGH (ref 0.30–0.70)

## 2015-11-18 LAB — PROTIME-INR
INR: 1.84
PROTHROMBIN TIME: 21.5 s — AB (ref 11.4–15.2)

## 2015-11-18 MED ORDER — HEPARIN (PORCINE) IN NACL 100-0.45 UNIT/ML-% IJ SOLN
950.0000 [IU]/h | INTRAMUSCULAR | Status: DC
  Administered 2015-11-18: 950 [IU]/h via INTRAVENOUS

## 2015-11-18 MED ORDER — CALCITRIOL 0.5 MCG PO CAPS
ORAL_CAPSULE | ORAL | Status: AC
  Filled 2015-11-18: qty 1

## 2015-11-18 MED ORDER — DEXTROSE 5 % IV SOLN
1.5000 g | INTRAVENOUS | Status: AC
  Administered 2015-11-19: 1.5 g via INTRAVENOUS
  Filled 2015-11-18 (×2): qty 1.5

## 2015-11-18 NOTE — Progress Notes (Signed)
[facility] Rounding Note  Subjective: Patient seen and examined this morning on HD. Feels about the same, no new complaints. Breathing is doing well.  Objective: BP (!) 149/97   Pulse 65   Temp 97.6 F (36.4 C) (Oral)   Resp 20   Ht 5\' 9"  (1.753 m)   Wt 221 lb 1.9 oz (100.3 kg)   SpO2 100%   BMI 32.65 kg/m    Intake/Output Summary (Last 24 hours) at 11/18/15 1145 Last data filed at 11/18/15 0553  Gross per 24 hour  Intake                0 ml  Output              276 ml  Net             -276 ml    Weight change:   EXAM: General: lying in bed, alert, on supplemental oxygen, no acute distress HEENT: [place]/AT, EOMI, no scleral icterus Neck: Right IJ temp catheter Pulm: diminished breath sounds, normal effort, no wheezes or crackles Abd: soft, protuberant Ext: warm and well perfused, 2+ lower extremity edema bilaterally. Left foot wrapped in clean, dry Ace bandage. Neuro: alert and oriented X3, cranial nerves II-XII grossly intact  Labs:  Recent Labs Lab 11/16/15 0415 11/17/15 1203 11/18/15 0350  NA 134* 129* 132*  K 3.9 3.9 3.8  CL 97* 97* 98*  CO2 27 26 25   GLUCOSE 124* 237* 109*  BUN 43* 34* 42*  CREATININE 3.40* 3.70* 4.05*  CALCIUM 8.0* 7.8* 8.0*  PHOS 5.3* 4.7* 5.0*      Recent Labs Lab 11/14/15 0430  11/16/15 0415 11/17/15 1203 11/18/15 0350  AST 20  --   --   --   --   ALT 19  --   --   --   --   ALKPHOS 116  --   --   --   --   BILITOT 0.8  --   --   --   --   PROT 5.9*  --   --   --   --   ALBUMIN 2.5*  < > 2.4* 2.5* 2.3*  < > = values in this interval not displayed.   Recent Labs Lab 11/14/15 0430 11/14/15 1845 11/15/15 1313 11/16/15 1443 11/18/15 0350  WBC 9.9 7.7 8.2 7.8 9.5  HGB 10.5* 10.7* 10.1* 10.3* 10.8*  HCT 32.0* 32.1* 29.9* 30.4* 32.8*  MCV 95.2 94.7 95.8 95.9 94.0  PLT 296 304 267 246 190    No results for input(s): CKTOTAL, CKMB, CKMBINDEX, TROPONINI in the last 168 hours.    Recent Labs Lab 11/17/15 0612  11/17/15 1114 11/17/15 1627 11/18/15 0553 11/18/15 0830  GLUCAP 130* 222* 210* 71 93    ABG    Component Value Date/Time   PHART 7.408 11/07/2015 0235   PCO2ART 30.9 (L) 11/07/2015 0235   PO2ART 71.1 (L) 11/07/2015 0235   HCO3 19.1 (L) 11/07/2015 0235   TCO2 20.1 11/07/2015 0235   ACIDBASEDEF 4.7 (H) 11/07/2015 0235   O2SAT 94.4 11/07/2015 0235   TAC trough level from 8/5 pending  Studies/Results: No results found. Scheduled Meds: . allopurinol  150 mg Oral Daily  . aspirin  81 mg Oral Daily  . calcitRIOL  0.5 mcg Oral Daily  . docusate sodium  100 mg Oral Daily  . famotidine  20 mg Oral QHS  . feeding supplement  1 Container Oral TID WC  . hydrALAZINE  25 mg  Oral Q8H  . insulin aspart  0-15 Units Subcutaneous TID WC  . insulin aspart  5 Units Subcutaneous TID WC  . insulin glargine  20 Units Subcutaneous QHS  . levothyroxine  137 mcg Oral QAC breakfast  . magnesium oxide  400 mg Oral BID  . metoprolol succinate  100 mg Oral Daily  . pantoprazole  40 mg Oral BID  . predniSONE  5 mg Oral Daily  . rosuvastatin  10 mg Oral QPM  . saccharomyces boulardii  250 mg Oral BID  . tacrolimus  1 mg Oral BID   PRN Meds alum & mag hydroxide-simeth, diphenoxylate-atropine, guaiFENesin-dextromethorphan, heparin, hydrALAZINE, HYDROcodone-acetaminophen, labetalol, levalbuterol, lidocaine, metoprolol, ondansetron (ZOFRAN) IV, phenol  Infusion Meds . heparin 1,200 Units/hr (11/18/15 [id number])   Background: 75 y.o. male.  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  Creatine baseline appears to be 1.8-2.0.  Primary nephrologist is Dr. Justin Mend. Saw him on 06/26/2015 with BUN 27, creatinine 1.88.  Admitted on 10/28/2015 with necrotic left great toe with cellulitis. Creatinine on admission was 2.6.  Seen by [name] in consult on 7/25.  Creatinine had been improving until last couple  days when started to trend back up post aortogram and in setting of being up 13 liters since admission.  Assessment/Plan:  Renal transplant (2005)  with AKI on CKD4 post contrast administration - Baseline creatinine 1.88-2 Timing of increased creatinine coincides with aortogram on August 1, however, atheroembolic event remains possible as well given his stuttering creatinine increases.  It appears he received 60cc of contrast during procedure which is a moderate contrast load given his GFR - HD today for more volume removal - support with dialysis for now - can do a 3 month trial for AKI, hope for recovery and continue all anti-rejection meds, then assume ESRD if no improvement - needs TDC/permanent HD access-possibly tomorrow if INR is subtherapeutic.  Discussed with pt not to eat or drink anything after midnight. VVS on board.  CKD-MBD  On calcitriol 0.50mcg daily, phosphorous 250mg  BID PTH on 7/25 was 82  HTN/Volume:   BP has been 117-161/45-107 last 24 hours Volume removal with HD    DM Per primary service  AFib:  BB for rate control, on heparin, coumadin per VVS  Gangrene/Cellulitis/Toe Amputation:   S/p atherectomy and DCB L popliteal art, and PTA left anterior tibial artery 11/05/2015.   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   VVS following.  HLD  Hypothyroidism   Jule Ser  11:45 AM

## 2015-11-18 NOTE — Procedures (Signed)
I have seen and examined this patient and agree with the plan of care   Patient seen on dialysis  [facility] cath -- flows 300  No infection or redness  Lower Edema  2 +  Mark [name] W 11/18/2015, 11:41 AM

## 2015-11-18 NOTE — Progress Notes (Signed)
ANTICOAGULATION CONSULT NOTE - Follow Up Consult  Pharmacy Consult for Heparin/Coumadin Indication: afib   Allergies  Allergen Reactions  . Tape Other (See Comments)    SKIN WILL TEAR!!    Patient Measurements: Height: 5\' 9"  (175.3 cm) Weight: 213 lb 6.5 oz (96.8 kg) (Bed Scale) IBW/kg (Calculated) : 70.7  HDWt: 92kg  Vital Signs: Temp: 97.8 F (36.6 C) (08/14 1201) Temp Source: Oral (08/14 1201) BP: 153/88 (08/14 1201) Pulse Rate: 71 (08/14 1201)  Labs:  Recent Labs  11/16/15 0415 11/16/15 1443 11/17/15 1203 11/17/15 2209 11/18/15 0350 11/18/15 1310  HGB  --  10.3*  --   --  10.8*  --   HCT  --  30.4*  --   --  32.8*  --   PLT  --  246  --   --  190  --   LABPROT 25.6*  --  20.8*  --  21.5*  --   INR 2.29  --  1.76  --  1.84  --   HEPARINUNFRC  --   --   --  0.45 0.78* 1.18*  CREATININE 3.40*  --  3.70*  --  4.05*  --     Estimated Creatinine Clearance: 18.1 mL/min (by C-G formula based on SCr of 4.05 mg/dL).   Assessment: [name] w/ warfarin PTA for AFib. Now s/p arteriogram 8/1 with successful arthrectomy and balloon angioplasty. Heparin restarted following left great toe amputation 8/2 and d/c'd 8/6. Only on warfarin now. INR previously supratherapeutic, now down to 1.76 after warfarin restarted 8/12.  Needs TDC and permanent access next week. Per Vascular recommendations, hold warfarin and start heparin 8/13. HL now high 1.18 - appears to have been drawn appropriately from opposite arm per RN. Hg low stable, plt wnl, no bleed per RN.  Warfarin PTA dosing: 5mg  daily except 2.5mg  on Tuesdays and Thursdays.   Goal of Therapy:  Heparin level 0.3-0.7 units/ml Monitor platelets by anticoagulation protocol: Yes    Plan:  -Hold heparin x 1h; then resume heparin at lower rate 950 units/h. Communicated w/ RN -8h HL, daily HL/CBC -Monitor s/sx bleeding -Hold warfarin for now  Elicia Lamp, PharmD, [facility] Clinical Pharmacist Pager [phone] 11/18/2015 3:00  PM

## 2015-11-18 NOTE — Progress Notes (Signed)
ANTICOAGULATION CONSULT NOTE - Follow Up Consult  Pharmacy Consult for heparin Indication: atrial fibrillation  Labs:  Recent Labs  11/15/15 1313 11/16/15 0415 11/16/15 1443 11/17/15 1203 11/17/15 2209 11/18/15 0350  HGB 10.1*  --  10.3*  --   --  10.8*  HCT 29.9*  --  30.4*  --   --  32.8*  PLT 267  --  246  --   --  190  LABPROT  --  25.6*  --  20.8*  --  21.5*  INR  --  2.29  --  1.76  --  1.84  HEPARINUNFRC  --   --   --   --  0.45 0.78*  CREATININE 4.09* 3.40*  --  3.70*  --   --     Assessment: 75yo male now above goal on heparin after one level at goal; lab drawn from arm opposite from where heparin is infusing.  Goal of Therapy:  Heparin level 0.3-0.7 units/ml   Plan:  Will decrease heparin gtt by 2 units/kg/hr to 1200 units/hr and check level in [place], PharmD, BCPS  11/18/2015,4:25 AM

## 2015-11-18 NOTE — Anesthesia Preprocedure Evaluation (Addendum)
Anesthesia Evaluation  Patient identified by MRN, date of birth, ID band Patient awake    Reviewed: Allergy & Precautions, NPO status , Patient's Chart, lab work & pertinent test results  History of Anesthesia Complications Negative for: history of anesthetic complications  Airway Mallampati: II  TM Distance: >3 FB Neck ROM: Full    Dental  (+) Teeth Intact, Dental Advisory Given   Pulmonary neg pulmonary ROS, shortness of breath,    breath sounds clear to auscultation + decreased breath sounds      Cardiovascular hypertension, Pt. on medications + Peripheral Vascular Disease and +CHF   Rhythm:Regular Rate:Normal     Neuro/Psych  Neuromuscular disease negative neurological ROS  negative psych ROS   GI/Hepatic negative GI ROS, Neg liver ROS,   Endo/Other  diabetes, Well Controlled, Type 2, Insulin DependentHypothyroidism   Renal/GU CRFRenal disease     Musculoskeletal   Abdominal (+)  Abdomen: soft. Bowel sounds: normal.  Peds  Hematology   Anesthesia Other Findings   Reproductive/Obstetrics                           Anesthesia Physical Anesthesia Plan  ASA: IV  Anesthesia Plan: MAC   Post-op Pain Management:    Induction: Intravenous  Airway Management Planned: Simple Face Mask and Natural Airway  Additional Equipment:   Intra-op Plan:   Post-operative Plan:   Informed Consent: I have reviewed the patients History and Physical, chart, labs and discussed the procedure including the risks, benefits and alternatives for the proposed anesthesia with the patient or authorized representative who has indicated his/her understanding and acceptance.     Plan Discussed with: CRNA  Anesthesia Plan Comments:        Anesthesia Quick Evaluation

## 2015-11-18 NOTE — Progress Notes (Signed)
Notified by pharmacy to hold heparin drip one hour. Heparin stopped. Pt educated on change of plan of care. Pt shows no s/s of bleeding.   Fritz Pickerel, RN

## 2015-11-18 NOTE — Progress Notes (Signed)
Patient ID: Mark [name], male   DOB: 06-04-1940, 75 y.o.   MRN: [id number]                                                                PROGRESS NOTE                                                                                                                                                                                                             Patient Demographics:    Mark [name], is a 75 y.o. male, DOB - April 28, 1940, [id number]  Admit date - 10/28/2015   Admitting Physician Mark Quill, DO  Outpatient Primary MD for the patient is Mark Brome, MD  LOS - 21  Outpatient Specialists:   Chief Complaint  Patient presents with  . 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 Patient presents to ED with c/o 'his toe is dead'. Creatinine baseline appears to be 1.8-2.3.  Primary nephrologist is Dr. Justin [name].  Saw him on 06/26/2015 with BUN 27, creatinine 1.88.  Admitted on 10/28/2015 with necrotic left great toe with cellulitis.  Creatinine on admission was 2.6.  Seen by Dr. Jimmy [name] in consult on 7/25.  His Prograf level was found to be low and was increased.  Patient underwent abdominal aortogram with angioplasty of the left anterior tibial artery and left popliteal artery on 11/05/2015.  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  He was on Vancomycin and Zosyn from 7/24-8/3.  Currently only on Zosyn.  He developed some dyspnea in the setting of being positive about 13 liters since admission with evidence of volume overload on exam. Worsening renal failure. Placement of temporary hemodialysis catheter on 8/10. Now started on HD. Will follow clinical response and renal function recovery.   Subjective:    Mark [name]  With flat affect, in no distress. Reports breathing has remained stable. Continue to have swelling. But improved. Seen at  HD.   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 -cardiology and nephrology were managing diuretics/fluid overload status with HD now -2-D echo did not show any wall motion abnormalities and the patient does not appear to have any ischemic changes on his EKG -Renal allograft function worse today with Creatinine 3.7 -Per nephrology rec's will continue HD for now -patient is still oliguric -will follow daily weights and strict intake and output  Gangrene of left  Toe/Toe Amputation: -S /P atherectomy and DCB Left popliteal artery and PTA left ATA (POD 5) -S/P Ray amputation of left great toe (POD 4) -Palpable Left popliteal pulse; wound is healing properly -vascular surgery on board. -patient has completed antibiotic therapy  Anemia of chronic diseases,  -FOBT positive, but no signs of acute bleeding appreciated -with ongoing hemodilution from anasarca/fluid overload, Hgb has remained stable around 10.5 for several days.  -Continue PPI twice a day -follow Hgb trend   Diarrhea-C. difficile negative 2 during this admission -will continue probiotic -PRN lomotil ordered  -No fever, no nausea, no vomiting, no abdominal pain. -symptoms improved according to patient   Acute on Chronic kidney disease stage IV, status post renal transplant , baseline creatinine around 1.7, now up to 4.9 and with oliguria -thought to be acute process caused by contrast  -requiring HD -will  continue tacrolimus -Had vein mapping today (anticipation of potential remanent dialysis catheter next couple days and possible AV access) -renal service on board, will follow rec's  A fib with aberrancy -  -continue b-blocker for rate controlled -on coumadin for anticoagulation (dose per pharmacy); will be on hold and only using heparin for anticipation of ANF/TDC placement in next couple days. -cardiology on board, will follow rec's -CHADsVASC score 4  DM2 -  uncontrolled -Hemoglobin A1c 8.2 -will continue SSI, meal coverage and lantus  Hypertension: uncontrolled -Continue norvasc and metoprolol -Prn hydralazine also ordered -HD will also help with BP  Hypothyroidism -continue Synthroid  Protein Calorie Malnutrition -will continue Prostat -albumin 2.1 -nutritional service consulted   Dyslipidemia-continue Crestor  Pulmonary hypertension:  Mr. Mark [name] has moderate pulmonary hypertension.    -Cardiology Recommending outpatient sleep study and PFTs.   -He does have grade 2 diastolic dysfunction and will benefit of volume control -currently needing HD -will follow cardiology and renal service rec's  Code Status : FULL CODE  Family Communication  :   Disposition Plan  :  ? Will likely need SNF at discharge; will follow PT evaluation.  Barriers For Discharge : worsening renal function, and the patient on dialysis now  Consults  :  Vascular surgery, nephrology, cardiology  Procedures  : Atherectomy and drug coated balloon angioplasty 8/1 , left ray amputation 8/2  DVT Prophylaxis  :  Heparin     Lab Results  Component Value Date   PLT 190 11/18/2015    Antibiotics  :     10/28/15 2200  piperacillin-tazobactam (ZOSYN) IVPB 3.375 g  Status:  Discontinued     3.375 g 12.5 mL/hr over 240 Minutes Intravenous Every 8 hours 10/28/15 2120 11/09/15 1057   10/28/15 2200  vancomycin (VANCOCIN) 2,500 mg in sodium chloride  0.9 % 500 mL IVPB     2,500 mg 250 mL/hr  over 120 Minutes Intravenous  Once 10/28/15 2154 10/29/15 2330        Objective:   Vitals:   11/18/15 1100 11/18/15 1130 11/18/15 1159 11/18/15 1201  BP: (!) 142/72 (!) 149/97 (!) 133/109 (!) 153/88  Pulse: 70 65 62 71  Resp:    19  Temp:    97.8 F (36.6 C)  TempSrc:    Oral  SpO2:    100%  Weight:    96.8 kg (213 lb 6.5 oz)  Height:        Wt Readings from Last 3 Encounters:  11/18/15 96.8 kg (213 lb 6.5 oz)  09/05/15 103.1 kg (227 lb 3.2 oz)  08/05/15 101.8 kg (224 lb 6.4 oz)     Intake/Output Summary (Last 24 hours) at 11/18/15 1320 Last data filed at 11/18/15 1201  Gross per 24 hour  Intake                0 ml  Output             3776 ml  Net            -3776 ml     Physical Exam General: AAOX3, in no acute distress. Seen while having HD. Denies CP and reports breathing is ok. Normal affect.  Patient continued to be oliguric. Anasarca state improving with HD; but still with swelling on exam.. Head/eyes: [place]/AT,PERRLA, no icterus Neck:Supple Neck, right IJ HD catheter in place; No cervical lymphadenopathy appriciated.  Lungs: Symmetrical Chest wall movement, Good air movement bilaterally, decrease breath sounds at bases, no wheezing  Heart: rate controlled. Positive A. Fib on telemetry, no rubs or gallops Abdomen: +ve B.Sounds, Abd Soft, No tenderness, No organomegaly appriciated, No rebound - guarding or rigidity. Extremities: Diffuse anasarca, 2-3+ pitting edema throughout; left ray amputation, with clean dressing and just minimal serosanguineous drainage.   Data Review:    CBC  Recent Labs Lab 11/14/15 0430 11/14/15 1845 11/15/15 1313 11/16/15 1443 11/18/15 0350  WBC 9.9 7.7 8.2 7.8 9.5  HGB 10.5* 10.7* 10.1* 10.3* 10.8*  HCT 32.0* 32.1* 29.9* 30.4* 32.8*  PLT 296 304 267 246 190  MCV 95.2 94.7 95.8 95.9 94.0  MCH 31.3 31.6 32.4 32.5 30.9  MCHC 32.8 33.3 33.8 33.9 32.9  RDW 16.6* 16.9* 16.9* 16.9* 17.2*    Chemistries   Recent Labs Lab  11/14/15 0430  11/15/15 1153 11/15/15 1313 11/16/15 0415 11/17/15 1203 11/18/15 0350  NA 135  < > 134* 133* 134* 129* 132*  K 4.4  < > 4.2 4.1 3.9 3.9 3.8  CL 103  < > 99* 99* 97* 97* 98*  CO2 21*  < > 24 24 27 26 25   GLUCOSE 209*  < > 143* 140* 124* 237* 109*  BUN 109*  < > 72* 72* 43* 34* 42*  CREATININE 4.53*  < > 4.16* 4.09* 3.40* 3.70* 4.05*  CALCIUM 8.7*  < > 8.2* 8.0* 8.0* 7.8* 8.0*  AST 20  --   --   --   --   --   --   ALT 19  --   --   --   --   --   --   ALKPHOS 116  --   --   --   --   --   --   BILITOT 0.8  --   --   --   --   --   --   < > =  values in this interval not displayed. ------------------------------------------------------------------------------------------------------------------ No results for input(s): CHOL, HDL, LDLCALC, TRIG, CHOLHDL, LDLDIRECT in the last 72 hours.  Lab Results  Component Value Date   HGBA1C 8.2 (H) 10/29/2015   ------------------------------------------------------------------------------------------------------------------ No results for input(s): TSH, T4TOTAL, T3FREE, THYROIDAB in the last 72 hours.  Invalid input(s): FREET3 ------------------------------------------------------------------------------------------------------------------ No results for input(s): VITAMINB12, FOLATE, FERRITIN, TIBC, IRON, RETICCTPCT in the last 72 hours.  Coagulation profile  Recent Labs Lab 11/14/15 0430 11/15/15 0303 11/16/15 0415 11/17/15 1203 11/18/15 0350  INR 3.38 3.14 2.29 1.76 1.84    Cardiac Enzymes No results for input(s): CKMB, TROPONINI, MYOGLOBIN in the last 168 hours.  Invalid input(s): CK ------------------------------------------------------------------------------------------------------------------ No results found for: BNP  Inpatient Medications  Scheduled Meds: . allopurinol  150 mg Oral Daily  . aspirin  81 mg Oral Daily  . calcitRIOL  0.5 mcg Oral Daily  . [START ON 11/19/2015] cefUROXime (ZINACEF)  IV   1.5 g Intravenous To SS-Surg  . docusate sodium  100 mg Oral Daily  . famotidine  20 mg Oral QHS  . feeding supplement  1 Container Oral TID WC  . hydrALAZINE  25 mg Oral Q8H  . insulin aspart  0-15 Units Subcutaneous TID WC  . insulin aspart  5 Units Subcutaneous TID WC  . insulin glargine  20 Units Subcutaneous QHS  . levothyroxine  137 mcg Oral QAC breakfast  . magnesium oxide  400 mg Oral BID  . metoprolol succinate  100 mg Oral Daily  . pantoprazole  40 mg Oral BID  . predniSONE  5 mg Oral Daily  . rosuvastatin  10 mg Oral QPM  . saccharomyces boulardii  250 mg Oral BID  . tacrolimus  1 mg Oral BID   Continuous Infusions: . heparin 1,200 Units/hr (11/18/15 0839)   PRN Meds:.alum & mag hydroxide-simeth, diphenoxylate-atropine, guaiFENesin-dextromethorphan, heparin, hydrALAZINE, HYDROcodone-acetaminophen, labetalol, levalbuterol, lidocaine, metoprolol, ondansetron (ZOFRAN) IV, phenol  Micro Results Recent Results (from the past 240 hour(s))  C difficile quick scan w PCR reflex     Status: None   Collection Time: 11/09/15  4:08 PM  Result Value Ref Range Status   C Diff antigen NEGATIVE NEGATIVE Final   C Diff toxin NEGATIVE NEGATIVE Final   C Diff interpretation No C. difficile detected.  Final    Radiology Reports Ir Fluoro Guide Cv Line Right  Result Date: 11/14/2015 INDICATION: Acute kidney injury and needs hemodialysis. Plan for placement of non tunneled catheter due to elevated INR level. EXAM: FLUOROSCOPIC AND ULTRASOUND GUIDED PLACEMENT OF A NON TUNNELED DIALYSIS CATHETER Physician: Stephan Minister. Henn, MD MEDICATIONS: None ANESTHESIA/SEDATION: None FLUOROSCOPY TIME:  Fluoroscopy Time: 24 seconds COMPLICATIONS: None immediate. PROCEDURE: Informed consent was obtained for placement of a non tunneled dialysis catheter. The patient was placed supine on the interventional table. Ultrasound confirmed a patent right internal jugular vein. Ultrasound images were obtained for  documentation. The right side of the neck was prepped and draped in a sterile fashion. The right neck was anesthetized with 1% lidocaine. Maximal barrier sterile technique was utilized including caps, mask, sterile gowns, sterile gloves, sterile drape, hand hygiene and skin antiseptic. A small incision was made with #11 blade scalpel. A 21 gauge needle directed into the right internal jugular vein with ultrasound guidance. A micropuncture dilator set was placed. 20 cm Mahurkar catheter was selected. J wire was advanced into the IVC. Tract was dilated to accommodate the catheter. Catheter tip placed near the superior cavoatrial junction. All the lumens aspirated and  flushed well. Appropriate amount of heparin was placed in the dialysis lumens. Catheter was sutured to the skin with Prolene suture. Fluoroscopic and ultrasound images were taken and saved for documentation. FINDINGS: Patent right internal jugular vein. Catheter tip near the superior cavoatrial junction. IMPRESSION: Successful placement of a non tunneled dialysis catheter using ultrasound and fluoroscopic guidance. Electronically Signed   By: Markus Daft M.D.   On: 11/14/2015 19:52   Ir US Guide Vasc Access Right  Result Date: 11/14/2015 INDICATION: Acute kidney injury and needs hemodialysis. Plan for placement of non tunneled catheter due to elevated INR level. EXAM: FLUOROSCOPIC AND ULTRASOUND GUIDED PLACEMENT OF A NON TUNNELED DIALYSIS CATHETER Physician: Stephan Minister. Henn, MD MEDICATIONS: None ANESTHESIA/SEDATION: None FLUOROSCOPY TIME:  Fluoroscopy Time: 24 seconds COMPLICATIONS: None immediate. PROCEDURE: Informed consent was obtained for placement of a non tunneled dialysis catheter. The patient was placed supine on the interventional table. Ultrasound confirmed a patent right internal jugular vein. Ultrasound images were obtained for documentation. The right side of the neck was prepped and draped in a sterile fashion. The right neck was  anesthetized with 1% lidocaine. Maximal barrier sterile technique was utilized including caps, mask, sterile gowns, sterile gloves, sterile drape, hand hygiene and skin antiseptic. A small incision was made with #11 blade scalpel. A 21 gauge needle directed into the right internal jugular vein with ultrasound guidance. A micropuncture dilator set was placed. 20 cm Mahurkar catheter was selected. J wire was advanced into the IVC. Tract was dilated to accommodate the catheter. Catheter tip placed near the superior cavoatrial junction. All the lumens aspirated and flushed well. Appropriate amount of heparin was placed in the dialysis lumens. Catheter was sutured to the skin with Prolene suture. Fluoroscopic and ultrasound images were taken and saved for documentation. FINDINGS: Patent right internal jugular vein. Catheter tip near the superior cavoatrial junction. IMPRESSION: Successful placement of a non tunneled dialysis catheter using ultrasound and fluoroscopic guidance. Electronically Signed   By: Markus Daft M.D.   On: 11/14/2015 19:52   Dg Chest Port 1 View  Result Date: 11/09/2015 CLINICAL DATA:  Shortness of breath EXAM: PORTABLE CHEST 1 VIEW COMPARISON:  November 06, 2015 FINDINGS: Mild cardiomegaly. The hila and mediastinum are unchanged. Diffuse bilateral interstitial opacities again identified and similar in the interval. The opacity is a little more focal in the left suprahilar region which is increased since November 05, 2015 but unchanged since November 06, 2015. No other acute interval changes. There may be tiny bilateral effusions with blunting of the costophrenic angles. IMPRESSION: 1. Suspected tiny bilateral pleural effusions and diffuse increased interstitial markings are most consistent with pulmonary edema. Atypical viral infection could have a similar appearance. Recommend clinical correlation. *Opacity in the left suprahilar region is a little more focal when compared November 05, 2015. The difference  could be due to confluence of shadows. Recommend follow-up to resolution. Electronically Signed   By: Dorise Bullion III M.D   On: 11/09/2015 09:14   Dg Chest Port 1 View  Result Date: 11/06/2015 CLINICAL DATA:  75 year old male with increasing shortness of breath and abnormal left side pulmonary auscultation. Initial encounter. EXAM: PORTABLE CHEST 1 VIEW COMPARISON:  0508 hours today and earlier. FINDINGS: Portable AP semi upright view at 2127 hours. Continued and mildly increased perihilar and basilar predominant interstitial pulmonary opacity. Stable cardiac size and mediastinal contours. No pneumothorax. No pleural effusion or consolidation identified. IMPRESSION: Increased bilateral pulmonary interstitial opacity. Top differential considerations include progression of interstitial edema versus  viral/ atypical respiratory infection. No pleural effusion identified. Electronically Signed   By: Genevie Ann M.D.   On: 11/06/2015 21:42   Dg Chest Port 1 View  Result Date: 11/06/2015 CLINICAL DATA:  75 year old male with aspiration of liquid. EXAM: PORTABLE CHEST 1 VIEW COMPARISON:  Chest radiograph dated 11/05/2015 FINDINGS: Mild cardiomegaly with central vascular congestion. Bibasilar Interstitial prominence and nodular densities, new from prior study may represent atelectatic changes versus infiltrate. There is no pleural effusion or pneumothorax. Top-normal cardiac silhouette. No acute osseous pathology. IMPRESSION: Mild congestive changes with bibasilar subsegmental atelectasis versus infiltrate. Electronically Signed   By: Anner Crete M.D.   On: 11/06/2015 05:16   Dg Chest Port 1 View  Result Date: 11/05/2015 CLINICAL DATA:  Shortness of breath EXAM: PORTABLE CHEST 1 VIEW COMPARISON:  March 14, 2015 FINDINGS: The lungs are clear. Heart is mildly enlarged with pulmonary vascularity within normal limits. No adenopathy. No bone lesions. IMPRESSION: Mild cardiac enlargement.  No edema or consolidation.  Electronically Signed   By: Lowella Grip III M.D.   On: 11/05/2015 09:42   Dg Foot Complete Left  Result Date: 10/28/2015 CLINICAL DATA:  Left big toe pain. EXAM: LEFT FOOT - COMPLETE 3+ VIEW COMPARISON:  None. FINDINGS: There is no evidence of fracture or dislocation. No evidence of dystrophic bony changes. There is soft tissue swelling of the distal first digit. Punctate lucencies within the area of soft tissue swelling may represent entrapped fat or potentially foci of gas. Heavy vascular calcifications are seen. IMPRESSION: No evidence of fracture of the left foot. Soft tissue swelling of the distal first digit with punctate lucencies within the area of swelling, which may represent entrapped fat or potentially foci of gas. Please correlate clinically regarding the possibility of cellulitis. Electronically Signed   By: Fidela Salisbury M.D.   On: 10/28/2015 21:01   Time Spent in minutes  30   Barton Dubois M.D on 11/18/2015 at 1:20 PM [phone]

## 2015-11-18 NOTE — Progress Notes (Signed)
Subjective:  69M w/ PMH of ESRD (s/p renal transplant), chronic atrial fibrillation (on Coumadin) chronic diastolic CHF, and Type 2 DM, admitted for gangrene of the L great toe, s/p L toe amputation on 11/06/2015.  No chest pain; currently undergoing dialysis  Objective:   Vital Signs : Vitals:   11/18/15 1000 11/18/15 1029 11/18/15 1100 11/18/15 1130  BP: (!) 130/45 (!) 145/56 (!) 142/72 (!) 149/97  Pulse: 66 65 70 65  Resp:      Temp:      TempSrc:      SpO2:      Weight:      Height:        Intake/Output from previous day:  Intake/Output Summary (Last 24 hours) at 11/18/15 1158 Last data filed at 11/18/15 0553  Gross per 24 hour  Intake                0 ml  Output              276 ml  Net             -276 ml    I/O since admission: +2857  Wt Readings from Last 3 Encounters:  11/18/15 221 lb 1.9 oz (100.3 kg)  09/05/15 227 lb 3.2 oz (103.1 kg)  08/05/15 224 lb 6.4 oz (101.8 kg)    Medications: . allopurinol  150 mg Oral Daily  . aspirin  81 mg Oral Daily  . calcitRIOL  0.5 mcg Oral Daily  . docusate sodium  100 mg Oral Daily  . famotidine  20 mg Oral QHS  . feeding supplement  1 Container Oral TID WC  . hydrALAZINE  25 mg Oral Q8H  . insulin aspart  0-15 Units Subcutaneous TID WC  . insulin aspart  5 Units Subcutaneous TID WC  . insulin glargine  20 Units Subcutaneous QHS  . levothyroxine  137 mcg Oral QAC breakfast  . magnesium oxide  400 mg Oral BID  . metoprolol succinate  100 mg Oral Daily  . pantoprazole  40 mg Oral BID  . predniSONE  5 mg Oral Daily  . rosuvastatin  10 mg Oral QPM  . saccharomyces boulardii  250 mg Oral BID  . tacrolimus  1 mg Oral BID    . heparin 1,200 Units/hr (11/18/15 0839)    Physical Exam:   General appearance: alert, cooperative and no distress Neck: no adenopathy, supple, symmetrical, trachea midline and thyroid not enlarged, symmetric, no tenderness/mass/nodules Lungs: no wheezing Heart: irregularly irregular  rhythm Abdomen: soft, non-tender; bowel sounds normal; no masses,  no organomegaly Extremities: 2+ pitting edema;  L foot bandaged   Rate: 75  Rhythm: atrial fibrillation    Lab Results:   Recent Labs  11/16/15 0415 11/17/15 1203 11/18/15 0350  NA 134* 129* 132*  K 3.9 3.9 3.8  CL 97* 97* 98*  CO2 _0 GLUCOSE 124* 237* 109*  BUN 43* 34* 42*  CREATININE 3.40* 3.70* 4.05*  CALCIUM 8.0* 7.8* 8.0*  PHOS 5.3* 4.7* 5.0*    Hepatic Function Latest Ref Rng & Units 11/18/2015 11/17/2015 11/16/2015  Total Protein 6.5 - 8.1 g/dL - - -  Albumin 3.5 - 5.0 g/dL 2.3(L) 2.5(L) 2.4(L)  AST 15 - 41 U/L - - -  ALT 17 - 63 U/L - - -  Alk Phosphatase 38 - 126 U/L - - -  Total Bilirubin 0.3 - 1.2 mg/dL - - -     Recent Labs  11/15/15 1313 11/16/15 1443 11/18/15 0350  WBC 8.2 7.8 9.5  HGB 10.1* 10.3* 10.8*  HCT 29.9* 30.4* 32.8*  MCV 95.8 95.9 94.0  PLT 267 246 190    No results for input(s): TROPONINI in the last 72 hours.  Invalid input(s): CK, MB  Lab Results  Component Value Date   TSH 1.92 06/06/2015   No results for input(s): HGBA1C in the last 72 hours.   Recent Labs  11/16/15 0415 11/17/15 1203 11/18/15 0350  ALBUMIN 2.4* 2.5* 2.3*    Recent Labs  11/18/15 0350  INR 1.84   BNP (last 3 results) No results for input(s): BNP in the last 8760 hours.  ProBNP (last 3 results) No results for input(s): PROBNP in the last 8760 hours.   Lipid Panel     Component Value Date/Time   CHOL 233 (H) 06/06/2015 1537   TRIG 397.0 (H) 06/06/2015 1537   HDL 29.20 (L) 06/06/2015 1537   CHOLHDL 8 06/06/2015 1537   VLDL 79.4 (H) 06/06/2015 1537   LDLCALC 88 09/27/2013 0858   LDLDIRECT 119.0 06/06/2015 1537      Imaging:  No results found.    Assessment/Plan:   Principal Problem:   Gangrene of toe (HCC) Active Problems:   Type II diabetes mellitus, uncontrolled ([place])   Renal transplant recipient   Diabetic ulcer of left great toe (HCC)    Cellulitis of great toe of left foot   Aspiration of liquid   SOB (shortness of breath)   Permanent atrial fibrillation (HCC)   Bibasilar crackles   CHF (congestive heart failure) (HCC)   Melena   Renal failure  1. Chronic AF;  Rate controlled in the 70s on metoprolol 100 mg bid. . On heparin.  2. Chronic diastolic HF 3. CKD s/p renal transplant 2005 with worsening of renal fxn now with dialysis support. Plan for AV access Tuesday 3.5 liters removed at dialysis today which just ended. 4. Gangrene R toe    Troy Sine, MD, [facility] 11/18/2015, 11:58 AM

## 2015-11-18 NOTE — Progress Notes (Signed)
  Progress Note    11/18/2015 7:32 AM 12 Days Post-Op  Subjective:  Sleeping-wakes to voice.  Tells me there is no dressing on his foot bc they don't know how to change it.  Afebrile HR  60's afib 0000000 systolic [id number] RA  Vitals:   11/17/15 2055 11/18/15 0545  BP: (!) 146/76 (!) 161/53  Pulse: 71 (!) 56  Resp: 18 18  Temp: 97.7 F (36.5 C) 97.6 F (36.4 C)    Physical Exam: Extremities:  Toe amp site with mild granulation tissue; there is fibrinous tissue present.   CBC    Component Value Date/Time   WBC 9.5 11/18/2015 0350   RBC 3.49 (L) 11/18/2015 0350   HGB 10.8 (L) 11/18/2015 0350   HCT 32.8 (L) 11/18/2015 0350   PLT 190 11/18/2015 0350   MCV 94.0 11/18/2015 0350   MCH 30.9 11/18/2015 0350   MCHC 32.9 11/18/2015 0350   RDW 17.2 (H) 11/18/2015 0350   LYMPHSABS 1.9 10/28/2015 1919   MONOABS 0.9 10/28/2015 1919   EOSABS 0.0 10/28/2015 1919   BASOSABS 0.0 10/28/2015 1919    BMET    Component Value Date/Time   NA 132 (L) 11/18/2015 0350   K 3.8 11/18/2015 0350   CL 98 (L) 11/18/2015 0350   CO2 25 11/18/2015 0350   GLUCOSE 109 (H) 11/18/2015 0350   BUN 42 (H) 11/18/2015 0350   CREATININE 4.05 (H) 11/18/2015 0350   CALCIUM 8.0 (L) 11/18/2015 0350   GFRNONAA 13 (L) 11/18/2015 0350   GFRAA 15 (L) 11/18/2015 0350    INR    Component Value Date/Time   INR 1.84 11/18/2015 0350     Intake/Output Summary (Last 24 hours) at 11/18/15 0732 Last data filed at 11/18/15 0553  Gross per 24 hour  Intake                0 ml  Output              276 ml  Net             -276 ml     Assessment:  75 y.o. 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   12 Days Post-Op  Plan: -pt's toe amp site is still marginal.  There is a small amount of granulation tissue present. -continue wet to dry dressing changes-will educate RN on how  to do wet to dry dressing -DVT prophylaxis:  Heparin gtt -INR up slightly today, he has only received one dose of coumadin 2mg  on Saturday-should be decreased tomorrow -needs TDC/permanent HD access-possibly tomorrow if INR is subtherapeutic.  Discussed with pt not to eat or drink anything after midnight.    Leontine Locket, PA-C [facility] [phone] 11/18/2015 7:32 AM   Patient seen and examined and will plan tdc and left arm avf tomorrow. He is agreeble to proceed. Strong DP signal and toe amp site is healing.  Brandon C. Donzetta Matters, MD [facility] [place] Office: [phone] Pager: [phone]

## 2015-11-19 ENCOUNTER — Other Ambulatory Visit: Payer: Self-pay | Admitting: *Deleted

## 2015-11-19 ENCOUNTER — Inpatient Hospital Stay (HOSPITAL_COMMUNITY): Payer: Medicare Other

## 2015-11-19 ENCOUNTER — Inpatient Hospital Stay (HOSPITAL_COMMUNITY): Payer: Medicare Other | Admitting: Anesthesiology

## 2015-11-19 ENCOUNTER — Encounter (HOSPITAL_COMMUNITY)
Admission: EM | Disposition: A | Discharge status: Skilled Nursing Facility | Payer: Self-pay | Source: Home / Self Care | Attending: Internal Medicine

## 2015-11-19 ENCOUNTER — Encounter: Payer: Self-pay | Admitting: Vascular Surgery

## 2015-11-19 ENCOUNTER — Telehealth: Payer: Self-pay | Admitting: Surgery

## 2015-11-19 DIAGNOSIS — N186 End stage renal disease: Secondary | ICD-10-CM

## 2015-11-19 HISTORY — PX: INSERTION OF DIALYSIS CATHETER: SHX1324

## 2015-11-19 HISTORY — PX: AV FISTULA PLACEMENT: SHX1204

## 2015-11-19 LAB — GLUCOSE, CAPILLARY
GLUCOSE-CAPILLARY: 111 mg/dL — AB (ref 65–99)
GLUCOSE-CAPILLARY: 101 mg/dL — AB (ref 65–99)
GLUCOSE-CAPILLARY: 105 mg/dL — AB (ref 65–99)
GLUCOSE-CAPILLARY: 300 mg/dL — AB (ref 65–99)
Glucose-Capillary: 323 mg/dL — ABNORMAL HIGH (ref 65–99)

## 2015-11-19 LAB — PROTIME-INR
INR: 1.75
PROTHROMBIN TIME: 20.6 s — AB (ref 11.4–15.2)

## 2015-11-19 LAB — CBC
HCT: 32.6 % — ABNORMAL LOW (ref 39.0–52.0)
HEMOGLOBIN: 10.7 g/dL — AB (ref 13.0–17.0)
MCH: 32.3 pg (ref 26.0–34.0)
MCHC: 32.8 g/dL (ref 30.0–36.0)
MCV: 98.5 fL (ref 78.0–100.0)
PLATELETS: 170 10*3/uL (ref 150–400)
RBC: 3.31 MIL/uL — ABNORMAL LOW (ref 4.22–5.81)
RDW: 17.4 % — AB (ref 11.5–15.5)
WBC: 6.9 10*3/uL (ref 4.0–10.5)

## 2015-11-19 LAB — BASIC METABOLIC PANEL
Anion gap: 5 (ref 5–15)
BUN: 22 mg/dL — ABNORMAL HIGH (ref 6–20)
CALCIUM: 7.8 mg/dL — AB (ref 8.9–10.3)
CO2: 27 mmol/L (ref 22–32)
CREATININE: 3.33 mg/dL — AB (ref 0.61–1.24)
Chloride: 100 mmol/L — ABNORMAL LOW (ref 101–111)
GFR calc non Af Amer: 17 mL/min — ABNORMAL LOW (ref >60)
GFR, EST AFRICAN AMERICAN: 19 mL/min — AB (ref >60)
Glucose, Bld: 159 mg/dL — ABNORMAL HIGH (ref 65–99)
Potassium: 4 mmol/L (ref 3.5–5.1)
SODIUM: 132 mmol/L — AB (ref 135–145)

## 2015-11-19 LAB — SURGICAL PCR SCREEN
MRSA, PCR: NEGATIVE
STAPHYLOCOCCUS AUREUS: POSITIVE — AB

## 2015-11-19 LAB — HEPARIN LEVEL (UNFRACTIONATED)
HEPARIN UNFRACTIONATED: 0.27 [IU]/mL — AB (ref 0.30–0.70)
Heparin Unfractionated: 0.45 IU/mL (ref 0.30–0.70)

## 2015-11-19 SURGERY — ARTERIOVENOUS (AV) FISTULA CREATION
Anesthesia: Monitor Anesthesia Care | Site: Chest | Laterality: Right

## 2015-11-19 MED ORDER — LIDOCAINE-EPINEPHRINE (PF) 1 %-1:200000 IJ SOLN
INTRAMUSCULAR | Status: DC | PRN
  Administered 2015-11-19: 15 mL

## 2015-11-19 MED ORDER — OXYCODONE HCL 5 MG PO TABS
5.0000 mg | ORAL_TABLET | Freq: Four times a day (QID) | ORAL | Status: DC | PRN
  Administered 2015-11-19 – 2015-11-23 (×2): 5 mg via ORAL
  Filled 2015-11-19 (×2): qty 1

## 2015-11-19 MED ORDER — SODIUM CHLORIDE 0.9 % IV SOLN
INTRAVENOUS | Status: DC | PRN
  Administered 2015-11-19: 500 mL

## 2015-11-19 MED ORDER — LIDOCAINE-EPINEPHRINE (PF) 1 %-1:200000 IJ SOLN
INTRAMUSCULAR | Status: AC
  Filled 2015-11-19: qty 30

## 2015-11-19 MED ORDER — PROPOFOL 10 MG/ML IV BOLUS
INTRAVENOUS | Status: AC
  Filled 2015-11-19: qty 20

## 2015-11-19 MED ORDER — HEPARIN SODIUM (PORCINE) 1000 UNIT/ML IJ SOLN
INTRAMUSCULAR | Status: AC
  Filled 2015-11-19: qty 1

## 2015-11-19 MED ORDER — FENTANYL CITRATE (PF) 250 MCG/5ML IJ SOLN
INTRAMUSCULAR | Status: AC
  Filled 2015-11-19: qty 5

## 2015-11-19 MED ORDER — PROPOFOL 500 MG/50ML IV EMUL
INTRAVENOUS | Status: DC | PRN
Start: 2015-11-19 — End: 2015-11-19
  Administered 2015-11-19: 25 ug/kg/min via INTRAVENOUS

## 2015-11-19 MED ORDER — HEPARIN (PORCINE) IN NACL 100-0.45 UNIT/ML-% IJ SOLN
1150.0000 [IU]/h | INTRAMUSCULAR | Status: DC
  Administered 2015-11-19: 950 [IU]/h via INTRAVENOUS
  Administered 2015-11-20: 1050 [IU]/h via INTRAVENOUS
  Administered 2015-11-21 – 2015-11-22 (×2): 1150 [IU]/h via INTRAVENOUS
  Filled 2015-11-19 (×4): qty 250

## 2015-11-19 MED ORDER — LIDOCAINE HCL (CARDIAC) 20 MG/ML IV SOLN
INTRAVENOUS | Status: DC | PRN
  Administered 2015-11-19: 30 mg via INTRAVENOUS

## 2015-11-19 MED ORDER — FENTANYL CITRATE (PF) 100 MCG/2ML IJ SOLN
INTRAMUSCULAR | Status: DC | PRN
  Administered 2015-11-19 (×3): 25 ug via INTRAVENOUS

## 2015-11-19 MED ORDER — 0.9 % SODIUM CHLORIDE (POUR BTL) OPTIME
TOPICAL | Status: DC | PRN
  Administered 2015-11-19: 1000 mL

## 2015-11-19 MED ORDER — SODIUM CHLORIDE 0.9 % IV SOLN
INTRAVENOUS | Status: DC | PRN
  Administered 2015-11-19: 07:00:00 via INTRAVENOUS

## 2015-11-19 MED ORDER — PROPOFOL 1000 MG/100ML IV EMUL
INTRAVENOUS | Status: AC
  Filled 2015-11-19: qty 100

## 2015-11-19 MED ORDER — HEPARIN SODIUM (PORCINE) 1000 UNIT/ML IJ SOLN
INTRAMUSCULAR | Status: DC | PRN
  Administered 2015-11-19: 3 mL

## 2015-11-19 MED ORDER — FENTANYL CITRATE (PF) 100 MCG/2ML IJ SOLN: 25.0000 ug | INTRAMUSCULAR | Status: DC | PRN

## 2015-11-19 MED ORDER — PHENYLEPHRINE 40 MCG/ML (10ML) SYRINGE FOR IV PUSH (FOR BLOOD PRESSURE SUPPORT)
PREFILLED_SYRINGE | INTRAVENOUS | Status: AC
  Filled 2015-11-19: qty 10

## 2015-11-19 SURGICAL SUPPLY — 47 items
ARMBAND PINK RESTRICT EXTREMIT (MISCELLANEOUS) ×4 IMPLANT
BIOPATCH RED 1 DISK 7.0 (GAUZE/BANDAGES/DRESSINGS) ×3 IMPLANT
BIOPATCH RED 1IN DISK 7.0MM (GAUZE/BANDAGES/DRESSINGS) ×1
CANISTER SUCTION 2500CC (MISCELLANEOUS) ×4 IMPLANT
CATH PALINDROME RT-P 15FX19CM (CATHETERS) ×4 IMPLANT
CLIP TI MEDIUM 6 (CLIP) ×4 IMPLANT
CLIP TI WIDE RED SMALL 6 (CLIP) ×8 IMPLANT
COVER PROBE W GEL 5X96 (DRAPES) ×4 IMPLANT
DRAPE C-ARM 42X72 X-RAY (DRAPES) ×4 IMPLANT
ELECT REM PT RETURN 9FT ADLT (ELECTROSURGICAL) ×4
ELECTRODE REM PT RTRN 9FT ADLT (ELECTROSURGICAL) ×2 IMPLANT
GAUZE SPONGE 2X2 8PLY STRL LF (GAUZE/BANDAGES/DRESSINGS) ×2 IMPLANT
GLOVE BIO SURGEON STRL SZ 6.5 (GLOVE) ×6 IMPLANT
GLOVE BIO SURGEON STRL SZ7.5 (GLOVE) ×12 IMPLANT
GLOVE BIO SURGEONS STRL SZ 6.5 (GLOVE) ×2
GLOVE BIOGEL PI IND STRL 6.5 (GLOVE) ×2 IMPLANT
GLOVE BIOGEL PI IND STRL 7.0 (GLOVE) ×2 IMPLANT
GLOVE BIOGEL PI IND STRL 8 (GLOVE) ×2 IMPLANT
GLOVE BIOGEL PI INDICATOR 6.5 (GLOVE) ×2
GLOVE BIOGEL PI INDICATOR 7.0 (GLOVE) ×2
GLOVE BIOGEL PI INDICATOR 8 (GLOVE) ×2
GLOVE SURG SS PI 6.5 STRL IVOR (GLOVE) ×4 IMPLANT
GOWN STRL REUS W/ TWL LRG LVL3 (GOWN DISPOSABLE) ×4 IMPLANT
GOWN STRL REUS W/ TWL XL LVL3 (GOWN DISPOSABLE) ×4 IMPLANT
GOWN STRL REUS W/TWL LRG LVL3 (GOWN DISPOSABLE) ×4
GOWN STRL REUS W/TWL XL LVL3 (GOWN DISPOSABLE) ×4
HEMOSTAT SNOW SURGICEL 2X4 (HEMOSTASIS) IMPLANT
KIT BASIN OR (CUSTOM PROCEDURE TRAY) ×4 IMPLANT
KIT ROOM TURNOVER OR (KITS) ×4 IMPLANT
LIQUID BAND (GAUZE/BANDAGES/DRESSINGS) ×8 IMPLANT
NEEDLE 18GX1X1/2 (RX/OR ONLY) (NEEDLE) ×4 IMPLANT
NS IRRIG 1000ML POUR BTL (IV SOLUTION) ×4 IMPLANT
PACK CV ACCESS (CUSTOM PROCEDURE TRAY) ×4 IMPLANT
PAD ARMBOARD 7.5X6 YLW CONV (MISCELLANEOUS) ×8 IMPLANT
SPONGE GAUZE 2X2 STER 10/PKG (GAUZE/BANDAGES/DRESSINGS) ×2
SUT ETHILON 3 0 PS 1 (SUTURE) ×4 IMPLANT
SUT MNCRL AB 4-0 PS2 18 (SUTURE) ×4 IMPLANT
SUT PROLENE 6 0 BV (SUTURE) ×12 IMPLANT
SUT SILK 2 0 (SUTURE) ×2
SUT SILK 2-0 18XBRD TIE 12 (SUTURE) ×2 IMPLANT
SUT SILK 3 0 (SUTURE) ×2
SUT SILK 3-0 18XBRD TIE 12 (SUTURE) ×2 IMPLANT
SUT VIC AB 3-0 SH 27 (SUTURE) ×3
SUT VIC AB 3-0 SH 27X BRD (SUTURE) ×2 IMPLANT
SYRINGE 10CC LL (SYRINGE) ×4 IMPLANT
UNDERPAD 30X30 INCONTINENT (UNDERPADS AND DIAPERS) ×4 IMPLANT
WATER STERILE IRR 1000ML POUR (IV SOLUTION) ×4 IMPLANT

## 2015-11-19 NOTE — Progress Notes (Addendum)
ANTICOAGULATION CONSULT NOTE - Follow Up Consult  Pharmacy Consult for Heparin/Coumadin Indication: afib   Allergies  Allergen Reactions  . Tape Other (See Comments)    SKIN WILL TEAR!!    Patient Measurements: Height: 5\' 9"  (175.3 cm) Weight: 213 lb 6.5 oz (96.8 kg) (Bed Scale) IBW/kg (Calculated) : 70.7  HDWt: 92kg  Vital Signs: Temp: 98.2 F (36.8 C) (08/15 2030) Temp Source: Oral (08/15 2030) BP: 121/52 (08/15 2030) Pulse Rate: 82 (08/15 2136)  Labs:  Recent Labs  11/17/15 1203  11/18/15 0350 11/18/15 1310 11/19/15 0027 11/19/15 2109  HGB  --   --  10.8*  --  10.7*  --   HCT  --   --  32.8*  --  32.6*  --   PLT  --   --  190  --  170  --   LABPROT 20.8*  --  21.5*  --  20.6*  --   INR 1.76  --  1.84  --  1.75  --   HEPARINUNFRC  --   < > 0.78* 1.18* 0.45 0.27*  CREATININE 3.70*  --  4.05*  --  3.33*  --   < > = values in this interval not displayed.  Estimated Creatinine Clearance: 22 mL/min (by C-G formula based on SCr of 3.33 mg/dL).   Assessment: [name] w/ warfarin PTA for AFib. Now s/p arteriogram 8/1 with successful arthrectomy and balloon angioplasty. Heparin restarted following left great toe amputation 8/2 and d/c'd 8/6. Was on warfarin, but then needed to be held for fistula creation. Heparin restarted this afternoon ~1300 post-op.  Heparin level has been fluctuating- was elevated, then in range on 950 units/hr but now confirmatory level is low at 0.27 units/mL.  Warfarin PTA dosing: 5mg  daily except 2.5mg  on Tuesdays and Thursdays.   Goal of Therapy:  Heparin level 0.3-0.7 units/ml Monitor platelets by anticoagulation protocol: Yes    Plan:  -Increase heparin slightly to 1050 units/hr -Daily HL/CBC -Monitor s/sx bleeding -Follow plans for warfarin   Lauren D. Bajbus, PharmD, BCPS Clinical Pharmacist Pager: [phone] 11/19/2015 10:10 PM   ADDENDUM: RN alerted [place] this am that heparin rate had not been changed, virtually no change to am  level, will have RN change rate to previously ordered and check level in [place], PharmD, BCPS 11/20/2015 5:46 AM

## 2015-11-19 NOTE — Op Note (Signed)
OPERATIVE NOTE   PROCEDURE: 1. Right IJ tunneled dialysis catheter placement with US guidance 2. left brachiocephalic arteriovenous fistula placement  PRE-OPERATIVE DIAGNOSIS: esrd  POST-OPERATIVE DIAGNOSIS: same  SURGEON: Akila Batta C. Donzetta Matters, MD  ASSISTANT(S): Ashley Jacobs, PA  ANESTHESIA: local and MAC  ESTIMATED BLOOD LOSS: 20 cc  FINDING(S): Suitable vein for access, dilated to 71mm  SPECIMEN(S):  none  INDICATIONS:   Mark [name] is a 75 y.o. male who presents with critical limb ishcemia that underwent percutaneous intervention.  He is now indicated for permanent and bridging dialysis access.  The patient is aware the risks include but are not limited to: bleeding, infection, injury to artery or vein, steal syndrome, nerve damage, ischemic monomelic neuropathy, failure to mature, and need for additional procedures.  The patient is aware of the risks of the procedure and elects to proceed forward.    DESCRIPTION: After written full informed consent was obtained from the patient, the patient was taken back to the operating room.  Prior to induction, the patient was given IV antibiotics.  After obtaining adequate sedation, the patient was prepped and draped in the standard fashion for a chest or neck tunneled dialysis catheter placement.   The cannulation site, the catheter exit site, and tract for the subcutaneous tunnel were then anesthestized with a total of 10 cc of 1% lidocaine with epinephrine.  Under ultrasound guidance, the right internal jugular vein was cannulated with the 18 gauge needle.  A J-wire was then placed down into the inferior vena cava under fluoroscopic guidance.   I then made stab incisions at the neck and exit sites.   I dissected from the exit site to the cannulation site with a tunneler.   The subcutaneous tunnel was dilated by passing a plastic dilator over the metal dissector. The wire was then unclamped and I removed the needle.   The skin tract and venotomy was dilated serially with dilators.  Finally, the dilator-sheath was placed under fluoroscopic guidance into the superior vena cava.  The dilator and wire were removed.  A 19 cm Diatek catheter was placed under fluoroscopic guidance down into the right atrium.  The sheath was broken and peeled away while holding the catheter cuff at the level of the skin.  The back end of this catheter was transected, and docked onto the tunneler.  The distal catheter was delivered through the subcutaneous tunnel.  The catheter was transected a second time, revealing the two lumens of this catheter.  The ports were docked onto these two lumens.  The catheter collar was then snapped into place.  Each port was tested by aspirating and flushing.  No resistance was noted.  Each port was then thoroughly flushed with heparinized saline.  The catheter was secured in placed with two interrupted stitches of 3-0 Nylon tied to the catheter.  The neck incision was closed with a U-stitch of 4-0 Monocryl.  The neck and chest incision were cleaned and sterile bandages applied.  Each port was then loaded with concentrated heparin (1000 Units/mL) at the manufacturer recommended volumes to each port.  Sterile caps were applied to each port.  On completion fluoroscopy, the tips of the catheter were in the right atrium, and there was no evidence of pneumothorax.  We then  turned attention the fistula part of the case  first identifying the patient's cephalic vein and brachial artery.  Using SonoSite guidance, the location of these vessels were marked out on the skin.   At  this point, I injected local anesthetic to obtain a field block of the antecubitum.  In total, I injected about 5 mL of a 1:1 mixture of 1% lidocain with epinephrine. I made a transverse incision at the level of the antecubitum and dissected through the subcutaneous tissue and fascia to gain exposure of the brachial artery.  This was noted to be 6 mm in  diameter externally.  This was dissected out proximally and distally and controlled with vessel loops .  I then dissected out the cephalic vein.  This was noted to be 4 mm in diameter externally.  The distal segment of the vein was ligated with a  2-0 silk, and the vein was transected.  The proximal segment was interrogated with serial dilators.  The vein accepted up to a 4 mm dilator without any difficulty.  I then instilled the heparinized saline into the vein and clamped it.  At this point, I reset my exposure of the brachial artery and placed the artery under tension proximally and distally.  I made an arteriotomy with a #11 blade, and then I extended the arteriotomy with a Potts scissor.  I injected heparinized saline proximal and distal to this arteriotomy.  The vein was then sewn to the artery in an end-to-side configuration with a running stitch of 7-0 Prolene.  Prior to completing this anastomosis, I allowed the vein and artery to backbleed.  There was no evidence of clot from any vessels.  I completed the anastomosis in the usual fashion and then released all vessel loops and clamps.  There was a palpable  thrill in the venous outflow, and there was a weakly palpable radial pulse.  At this point, I irrigated out the surgical wound.  There was no further active bleeding.  The subcutaneous tissue was reapproximated with a running stitch of 3-0 Vicryl.  The skin was then reapproximated with a running subcuticular stitch of 4-0 Vicryl.  The skin was then cleaned, dried, and reinforced with Dermabond.  The patient tolerated this procedure well and will be transferred to pacu.  COMPLICATIONS: none  CONDITION: good   Ursula Dermody C. Donzetta Matters, MD [facility] [place] Office: [phone] Pager: [phone]  11/19/2015, 9:32 AM

## 2015-11-19 NOTE — Progress Notes (Deleted)
OPERATIVE NOTE   PROCEDURE: 1. Right IJ tunneled dialysis catheter placement with US guidance 2. left brachiocephalic arteriovenous fistula placement  PRE-OPERATIVE DIAGNOSIS: esrd  POST-OPERATIVE DIAGNOSIS: same  SURGEON: Brandon C. Donzetta Matters, MD  ASSISTANT(S): Ashley Jacobs, PA  ANESTHESIA: local and MAC  ESTIMATED BLOOD LOSS: 20 cc  FINDING(S): Suitable vein for access, dilated to 38mm  SPECIMEN(S):  none  INDICATIONS:   Mark [name] is a 75 y.o. male who presents with critical limb ishcemia that underwent percutaneous intervention.  He is now indicated for permanent and bridging dialysis access.  The patient is aware the risks include but are not limited to: bleeding, infection, injury to artery or vein, steal syndrome, nerve damage, ischemic monomelic neuropathy, failure to mature, and need for additional procedures.  The patient is aware of the risks of the procedure and elects to proceed forward.    DESCRIPTION: After written full informed consent was obtained from the patient, the patient was taken back to the operating room.  Prior to induction, the patient was given IV antibiotics.  After obtaining adequate sedation, the patient was prepped and draped in the standard fashion for a chest or neck tunneled dialysis catheter placement.   The cannulation site, the catheter exit site, and tract for the subcutaneous tunnel were then anesthestized with a total of 10 cc of 1% lidocaine with epinephrine.  Under ultrasound guidance, the right internal jugular vein was cannulated with the 18 gauge needle.  A J-wire was then placed down into the inferior vena cava under fluoroscopic guidance.   I then made stab incisions at the neck and exit sites.   I dissected from the exit site to the cannulation site with a tunneler.   The subcutaneous tunnel was dilated by passing a plastic dilator over the metal dissector. The wire was then unclamped and I removed the needle.  The skin tract  and venotomy was dilated serially with dilators.  Finally, the dilator-sheath was placed under fluoroscopic guidance into the superior vena cava.  The dilator and wire were removed.  A 19 cm Diatek catheter was placed under fluoroscopic guidance down into the right atrium.  The sheath was broken and peeled away while holding the catheter cuff at the level of the skin.  The back end of this catheter was transected, and docked onto the tunneler.  The distal catheter was delivered through the subcutaneous tunnel.  The catheter was transected a second time, revealing the two lumens of this catheter.  The ports were docked onto these two lumens.  The catheter collar was then snapped into place.  Each port was tested by aspirating and flushing.  No resistance was noted.  Each port was then thoroughly flushed with heparinized saline.  The catheter was secured in placed with two interrupted stitches of 3-0 Nylon tied to the catheter.  The neck incision was closed with a U-stitch of 4-0 Monocryl.  The neck and chest incision were cleaned and sterile bandages applied.  Each port was then loaded with concentrated heparin (1000 Units/mL) at the manufacturer recommended volumes to each port.  Sterile caps were applied to each port.  On completion fluoroscopy, the tips of the catheter were in the right atrium, and there was no evidence of pneumothorax.  We then  turned attention the fistula part of the case  first identifying the patient's cephalic vein and brachial artery.  Using SonoSite guidance, the location of these vessels were marked out on the skin.   At  this point, I injected local anesthetic to obtain a field block of the antecubitum.  In total, I injected about 5 mL of a 1:1 mixture of 1% lidocain with epinephrine. I made a transverse incision at the level of the antecubitum and dissected through the subcutaneous tissue and fascia to gain exposure of the brachial artery.  This was noted to be 6 mm in diameter  externally.  This was dissected out proximally and distally and controlled with vessel loops .  I then dissected out the cephalic vein.  This was noted to be 4 mm in diameter externally.  The distal segment of the vein was ligated with a  2-0 silk, and the vein was transected.  The proximal segment was interrogated with serial dilators.  The vein accepted up to a 4 mm dilator without any difficulty.  I then instilled the heparinized saline into the vein and clamped it.  At this point, I reset my exposure of the brachial artery and placed the artery under tension proximally and distally.  I made an arteriotomy with a #11 blade, and then I extended the arteriotomy with a Potts scissor.  I injected heparinized saline proximal and distal to this arteriotomy.  The vein was then sewn to the artery in an end-to-side configuration with a running stitch of 7-0 Prolene.  Prior to completing this anastomosis, I allowed the vein and artery to backbleed.  There was no evidence of clot from any vessels.  I completed the anastomosis in the usual fashion and then released all vessel loops and clamps.  There was a palpable  thrill in the venous outflow, and there was a weakly palpable radial pulse.  At this point, I irrigated out the surgical wound.  There was no further active bleeding.  The subcutaneous tissue was reapproximated with a running stitch of 3-0 Vicryl.  The skin was then reapproximated with a running subcuticular stitch of 4-0 Vicryl.  The skin was then cleaned, dried, and reinforced with Dermabond.  The patient tolerated this procedure well and will be transferred to pacu.  COMPLICATIONS: none  CONDITION: good   Brandon C. Donzetta Matters, MD [facility] [place] Office: [phone] Pager: [phone]  11/19/2015, 9:32 AM

## 2015-11-19 NOTE — Progress Notes (Signed)
Nutrition Follow Up  DOCUMENTATION CODES:   Obesity unspecified  INTERVENTION:    Continue Boost Breeze po TID, each supplement provides 250 kcal and 9 grams of protein  NUTRITION DIAGNOSIS:   Increased nutrient needs related to wound healing as evidenced by estimated needs; ongoing  GOAL:   Patient will meet greater than or equal to 90% of their needs; progressing  MONITOR:   PO intake, Supplement acceptance, Weight trends, Labs, Skin, I & O's  ASSESSMENT:   75 y.o. 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  Patient presents to ED with c/o 'his toe is dead'.     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 PO intake remains variable at 25-50% per flowsheet records. Receiving Boost Breeze supplement TID. Disp: SNF placement.  Diet Order:  Diet renal/carb modified with fluid restriction Diet-HS Snack? Nothing; Room service appropriate? Yes; Fluid consistency: Thin  Skin:  Wound (see comment) (diabetic ulcer L great toe)  Last BM:  8/14  Height:   Ht Readings from Last 1 Encounters:  10/28/15 5\' 9"  (1.753 m)    Weight:   Wt Readings from Last 1 Encounters:  11/18/15 213 lb 6.5 oz (96.8 kg)    Ideal Body Weight:  72.7 kg  BMI:  Body mass index is 31.51 kg/m.  Estimated Nutritional Needs:   Kcal:  [id number]  Protein:  105-125 grams  Fluid:  2-2.3 L/day  EDUCATION NEEDS:   No education needs identified at this time  Arthur Holms, RD, LDN Pager #: [phone] After-Hours Pager #: [phone]

## 2015-11-19 NOTE — Telephone Encounter (Signed)
-----   Message from Mena Goes, RN sent at 11/19/2015 10:08 AM EDT ----- Regarding: schedule 4 weeks w/ fistula duplex   ----- Message ----- From: Gabriel Earing, PA-C Sent: 11/19/2015   9:36 AM To: [facility] Charge Pool  Disregard the last message on this pt.  He is going to f/u with Dr. Trula Slade in 4 weeks for his toe amp and fistula.  He will need a duplex of the fistula.  Thanks, Aldona Bar

## 2015-11-19 NOTE — Progress Notes (Signed)
  Progress Note    11/19/2015 6:56 AM Day of Surgery  Subjective:  Feeling ok this a.m.  Vitals:   11/18/15 1609 11/18/15 1947  BP: (!) 139/44 134/61  Pulse: 62 66  Resp:  18  Temp:  97.8 F (36.6 C)    Physical Exam: Non labored breathing Left arm without edema Bilateral le edema improved Dressing left foot cdi  CBC    Component Value Date/Time   WBC 6.9 11/19/2015 0027   RBC 3.31 (L) 11/19/2015 0027   HGB 10.7 (L) 11/19/2015 0027   HCT 32.6 (L) 11/19/2015 0027   PLT 170 11/19/2015 0027   MCV 98.5 11/19/2015 0027   MCH 32.3 11/19/2015 0027   MCHC 32.8 11/19/2015 0027   RDW 17.4 (H) 11/19/2015 0027   LYMPHSABS 1.9 10/28/2015 1919   MONOABS 0.9 10/28/2015 1919   EOSABS 0.0 10/28/2015 1919   BASOSABS 0.0 10/28/2015 1919    BMET    Component Value Date/Time   NA 132 (L) 11/19/2015 0027   K 4.0 11/19/2015 0027   CL 100 (L) 11/19/2015 0027   CO2 27 11/19/2015 0027   GLUCOSE 159 (H) 11/19/2015 0027   BUN 22 (H) 11/19/2015 0027   CREATININE 3.33 (H) 11/19/2015 0027   CALCIUM 7.8 (L) 11/19/2015 0027   GFRNONAA 17 (L) 11/19/2015 0027   GFRAA 19 (L) 11/19/2015 0027    INR    Component Value Date/Time   INR 1.75 11/19/2015 0027     Intake/Output Summary (Last 24 hours) at 11/19/15 0656 Last data filed at 11/18/15 1420  Gross per 24 hour  Intake              120 ml  Output             3500 ml  Net            -3380 ml     Assessment:  75 y.o. male is revascularization lle with renal failure in need of permanent access, right handed. To OR today for tdc and left arm avf placement Consents on chart   Mark [name] C. Donzetta Matters, MD [facility] [place] Office: [phone] Pager: [phone]  11/19/2015 6:56 AM

## 2015-11-19 NOTE — Clinical Social Work Note (Addendum)
CSW spoke with Derenda Mis, admissions coordinator at [facility]. She reported patient will need to receive dialysis at a center close to [facility]. She has a bed available for patient once medically ready. CSW called Bethena Roys with dialysis. Bethena Roys stated she will work on setting patient up with a dialysis center near [facility]. CSW continuing to follow for discharge needs.   [employer], LCSW [phone]

## 2015-11-19 NOTE — Progress Notes (Signed)
Pt not voided today post surgery. Bladder scan shows 292 mL at 1850, pt asymptomatic - denies feeling full or urge to void. Pt is AKI and not voided much past few days. Dr. Dyann Kief states this is his baseline the past few days. Dr. Dyann Kief made aware - instructed to I/O cath if above 350 and symptomatic.   Fritz Pickerel, RN

## 2015-11-19 NOTE — Anesthesia Postprocedure Evaluation (Signed)
Anesthesia Post Note  Patient: SOHAM GODETTE  Procedure(s) Performed: Procedure(s) (LRB): LEFT ARM ARTERIOVENOUS (AV) FISTULA CREATION (Left) INSERTION OF DIALYSIS CATHETER RIGHT INTERNAL JUGULAR (Right)  Patient location during evaluation: PACU Anesthesia Type: MAC Level of consciousness: awake and alert Pain management: pain level controlled Vital Signs Assessment: post-procedure vital signs reviewed and stable Respiratory status: spontaneous breathing, nonlabored ventilation, respiratory function stable and patient connected to nasal cannula oxygen Cardiovascular status: stable and blood pressure returned to baseline Anesthetic complications: no    Last Vitals:  Vitals:   11/19/15 1015 11/19/15 1101  BP:  (!) 146/52  Pulse: (!) 57 62  Resp: 17 17  Temp:      Last Pain:  Vitals:   11/19/15 1226  TempSrc:   PainSc: 9                  Zyheir Daft,JAMES TERRILL

## 2015-11-19 NOTE — Progress Notes (Signed)
[facility] Rounding Note  Subjective: Patient getting right tunneled IJ HD catheter and left brachiocephalic AV fistula today.   Objective: BP (!) 146/52 (BP Location: Right Arm)   Pulse 62   Temp 97.4 F (36.3 C)   Resp 17   Ht 5\' 9"  (1.753 m)   Wt 213 lb 6.5 oz (96.8 kg) Comment: Bed Scale  SpO2 98%   BMI 31.51 kg/m    Intake/Output Summary (Last 24 hours) at 11/19/15 1141 Last data filed at 11/19/15 [id number]  Gross per 24 hour  Intake              320 ml  Output             3550 ml  Net            -3230 ml    Weight change:   EXAM: Patient not examined as he was in the OR.  Labs:  Recent Labs Lab 11/16/15 0415 11/17/15 1203 11/18/15 0350 11/19/15 0027  NA 134* 129* 132* 132*  K 3.9 3.9 3.8 4.0  CL 97* 97* 98* 100*  CO2 27 26 25 27   GLUCOSE 124* 237* 109* 159*  BUN 43* 34* 42* 22*  CREATININE 3.40* 3.70* 4.05* 3.33*  CALCIUM 8.0* 7.8* 8.0* 7.8*  PHOS 5.3* 4.7* 5.0*  --       Recent Labs Lab 11/14/15 0430  11/16/15 0415 11/17/15 1203 11/18/15 0350  AST 20  --   --   --   --   ALT 19  --   --   --   --   ALKPHOS 116  --   --   --   --   BILITOT 0.8  --   --   --   --   PROT 5.9*  --   --   --   --   ALBUMIN 2.5*  < > 2.4* 2.5* 2.3*  < > = values in this interval not displayed.   Recent Labs Lab 11/14/15 1845 11/15/15 1313 11/16/15 1443 11/18/15 0350 11/19/15 0027  WBC 7.7 8.2 7.8 9.5 6.9  HGB 10.7* 10.1* 10.3* 10.8* 10.7*  HCT 32.1* 29.9* 30.4* 32.8* 32.6*  MCV 94.7 95.8 95.9 94.0 98.5  PLT 304 267 246 190 170    No results for input(s): CKTOTAL, CKMB, CKMBINDEX, TROPONINI in the last 168 hours.    Recent Labs Lab 11/18/15 1628 11/18/15 2133 11/19/15 0510 11/19/15 0937 11/19/15 1120  GLUCAP 170* 183* 111* 101* 105*    ABG    Component Value Date/Time   PHART 7.408 11/07/2015 0235   PCO2ART 30.9 (L) 11/07/2015 0235   PO2ART 71.1 (L) 11/07/2015 0235   HCO3 19.1 (L) 11/07/2015 0235   TCO2 20.1 11/07/2015 0235   ACIDBASEDEF 4.7  (H) 11/07/2015 0235   O2SAT 94.4 11/07/2015 0235   TAC trough level from 8/5 pending  Studies/Results: Dg Chest Port 1 View  Result Date: 11/19/2015 CLINICAL DATA:  As post dialysis catheter placement EXAM: PORTABLE CHEST 1 VIEW COMPARISON:  11/09/2015 FINDINGS: Previously seen temper a catheter is been removed in a new right jugular dialysis catheter placed in satisfactory position. Cardiac shadow is within normal limits. Vascular congestion is noted likely related to volume overload. No sizable effusion is seen. IMPRESSION: Vascular congestion likely related to volume overload. No pneumothorax is noted. Electronically Signed   By: Inez Catalina M.D.   On: 11/19/2015 10:03   Dg Fluoro Guide Cv Line-no Report  Result Date: 11/19/2015 CLINICAL DATA:  FLOURO GUIDE CV LINE Fluoroscopy was utilized by the requesting physician.  No radiographic interpretation.   Scheduled Meds: . allopurinol  150 mg Oral Daily  . aspirin  81 mg Oral Daily  . calcitRIOL  0.5 mcg Oral Daily  . docusate sodium  100 mg Oral Daily  . famotidine  20 mg Oral QHS  . feeding supplement  1 Container Oral TID WC  . hydrALAZINE  25 mg Oral Q8H  . insulin aspart  0-15 Units Subcutaneous TID WC  . insulin aspart  5 Units Subcutaneous TID WC  . insulin glargine  20 Units Subcutaneous QHS  . levothyroxine  137 mcg Oral QAC breakfast  . magnesium oxide  400 mg Oral BID  . metoprolol succinate  100 mg Oral Daily  . pantoprazole  40 mg Oral BID  . predniSONE  5 mg Oral Daily  . rosuvastatin  10 mg Oral QPM  . saccharomyces boulardii  250 mg Oral BID  . tacrolimus  1 mg Oral BID   PRN Meds alum & mag hydroxide-simeth, diphenoxylate-atropine, guaiFENesin-dextromethorphan, hydrALAZINE, HYDROcodone-acetaminophen, labetalol, levalbuterol, metoprolol, ondansetron (ZOFRAN) IV, phenol  Infusion Meds . heparin     Background: 75 y.o. male.  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  Creatine baseline appears to be 1.8-2.0.  Primary nephrologist is Dr. Justin Mend. Saw him on 06/26/2015 with BUN 27, creatinine 1.88.  Admitted on 10/28/2015 with necrotic left great toe with cellulitis. Creatinine on admission was 2.6.  Seen by [name] in consult on 7/25.  Creatinine had been improving until last couple days when started to trend back up post aortogram and in setting of being up 13 liters since admission.  Assessment/Plan:  Renal transplant (2005)  with AKI on CKD4 post contrast administration - Baseline creatinine 1.88-2 Timing of increased creatinine coincides with aortogram on August 1, however, atheroembolic event remains possible as well given his stuttering creatinine increases.  It appears he received 60cc of contrast during procedure which is a moderate contrast load given his GFR - access surgery today with right tunneled IJ and left AV fistula with VVS. - support with dialysis for now - orders placed for tomorrow. - can do a 3 month trial for AKI, hope for recovery and continue all anti-rejection meds, then assume ESRD if no improvement.  CKD-MBD  On calcitriol 0.22mcg daily, phosphorous 250mg  BID PTH on 7/25 was 82  HTN/Volume:   BP has been 117-161/45-107 last 24 hours Volume removal with HD    DM Per primary service  AFib:  BB for rate control, on heparin, coumadin per VVS  Gangrene/Cellulitis/Toe Amputation:   S/p atherectomy and DCB L popliteal art, and PTA left anterior tibial artery 11/05/2015.   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   VVS following.  HLD  Hypothyroidism   Jule Ser  11:41 AM

## 2015-11-19 NOTE — Anesthesia Procedure Notes (Addendum)
Procedure Name: MAC Date/Time: 11/19/2015 7:50 AM Performed by: Lavell Luster Pre-anesthesia Checklist: Patient identified, Emergency Drugs available, Suction available, Patient being monitored and Timeout performed Patient Re-evaluated:Patient Re-evaluated prior to inductionOxygen Delivery Method: Nasal cannula Preoxygenation: Pre-oxygenation with 100% oxygen Intubation Type: IV induction Placement Confirmation: positive ETCO2 and breath sounds checked- equal and bilateral Dental Injury: Teeth and Oropharynx as per pre-operative assessment

## 2015-11-19 NOTE — Telephone Encounter (Signed)
Cxl'd appt for 8/17, sched appt 9/11; lab at 11:00 and MD at 12:00. Lm on pt's cell# to inform them of changes.

## 2015-11-19 NOTE — Progress Notes (Signed)
Physical Therapy Treatment Patient Details Name: Mark [name] MRN: [id number] DOB: 04-12-1940 Today's Date: 11/19/2015    History of Present Illness 75 y.o. male s/p L great toe amputation. PMH significant HTN, afib on Coumadin, ESRD s/p kidney tranplant 12 years ago, and DM type II.    PT Comments    Pt is willing to do there ex on the bed but just came back from surgery later this morning for HD shunt placement.  Has minor pain and is mainly lethargic from meds and anesthesia.  Will continue acutely with pt to increase standing control toward gait to allow his progression to SNF for home eventually.    Follow Up Recommendations  SNF     Equipment Recommendations  Rolling walker with 5" wheels    Recommendations for Other Services Rehab consult     Precautions / Restrictions Precautions Precautions: Fall Restrictions Weight Bearing Restrictions: Yes RLE Weight Bearing: Weight bearing as tolerated LLE Weight Bearing: Partial weight bearing    Mobility  Bed Mobility Overal bed mobility: Needs Assistance                Transfers                 General transfer comment: held due to pt being tired from surgery  Ambulation/Gait                 Stairs            Wheelchair Mobility    Modified Rankin (Stroke Patients Only)       Balance                                    Cognition Arousal/Alertness: Lethargic Behavior During Therapy: WFL for tasks assessed/performed Overall Cognitive Status: Within Functional Limits for tasks assessed                 General Comments: sleepy from getting HD shunt placed and in bed     Exercises General Exercises - Lower Extremity Ankle Circles/Pumps: AROM;Both;10 reps Quad Sets: AROM;Both;10 reps Gluteal Sets: AROM;Both;10 reps Heel Slides: AAROM;Both;10 reps Hip ABduction/ADduction: AROM;AAROM;Both;10 reps Straight Leg Raises: AAROM;Both;10 reps Hip Flexion/Marching:  AAROM;AROM;Both;10 reps    General Comments General comments (skin integrity, edema, etc.): Pt in bed with L foot propped up and avoided pressure of blankets on LLE      Pertinent Vitals/Pain Pain Assessment: Faces Faces Pain Scale: Hurts a little bit Pain Intervention(s): Monitored during session;Limited activity within patient's tolerance;Repositioned    Home Living                      Prior Function            PT Goals (current goals can now be found in the care plan section) Acute Rehab PT Goals Patient Stated Goal: get home PT Goal Formulation: With patient Progress towards PT goals: Progressing toward goals    Frequency  Min 2X/week    PT Plan Current plan remains appropriate    Co-evaluation             End of Session Equipment Utilized During Treatment: Oxygen Activity Tolerance: Patient limited by fatigue;Patient limited by lethargy Patient left: in bed;with call bell/phone within reach;with bed alarm set     Time: [id number] PT Time Calculation (min) (ACUTE ONLY): 24 min  Charges:  $Therapeutic Exercise: 23-37 mins  G Codes:      Ramond Dial 11/19/2015, 2:19 PM    Mee Hives, PT MS Acute Rehab Dept. Number: [place] and [place]

## 2015-11-19 NOTE — Progress Notes (Signed)
ANTICOAGULATION CONSULT NOTE - Follow Up Consult  Pharmacy Consult for heparin Indication: atrial fibrillation  Labs:  Recent Labs  11/16/15 0415  11/16/15 1443 11/17/15 1203  11/18/15 0350 11/18/15 1310 11/19/15 0027  HGB  --   < > 10.3*  --   --  10.8*  --  10.7*  HCT  --   --  30.4*  --   --  32.8*  --  32.6*  PLT  --   --  246  --   --  190  --  170  LABPROT 25.6*  --   --  20.8*  --  21.5*  --   --   INR 2.29  --   --  1.76  --  1.84  --   --   HEPARINUNFRC  --   --   --   --   < > 0.78* 1.18* 0.45  CREATININE 3.40*  --   --  3.70*  --  4.05*  --  3.33*  < > = values in this interval not displayed.   Assessment/Plan:  75yo male therapeutic on heparin after rate changes. Will continue gtt at current rate and confirm stable with additional level.   Wynona Neat, PharmD, BCPS  11/19/2015,1:59 AM

## 2015-11-19 NOTE — Transfer of Care (Signed)
Immediate Anesthesia Transfer of Care Note  Patient: Mark [name]  Procedure(s) Performed: Procedure(s): LEFT ARM ARTERIOVENOUS (AV) FISTULA CREATION (Left) INSERTION OF DIALYSIS CATHETER RIGHT INTERNAL JUGULAR (Right)  Patient Location: PACU  Anesthesia Type:MAC  Level of Consciousness: sedated  Airway & Oxygen Therapy: Patient connected to nasal cannula oxygen  Post-op Assessment: Post -op Vital signs reviewed and stable  Post vital signs: stable  Last Vitals:  Vitals:   11/18/15 1609 11/18/15 1947  BP: (!) 139/44 134/61  Pulse: 62 66  Resp:  18  Temp:  36.6 C    Last Pain:  Vitals:   11/18/15 2355  TempSrc:   PainSc: Asleep      Patients Stated Pain Goal: 1 ([id number])  Complications: No apparent anesthesia complications

## 2015-11-19 NOTE — Progress Notes (Signed)
Patient ID: Mark [name], male   DOB: 1940-11-20, 75 y.o.   MRN: [id number]                                                                PROGRESS NOTE                                                                                                                                                                                                             Patient Demographics:    Mark [name], is a 75 y.o. male, DOB - 1940-11-18, [id number]  Admit date - 10/28/2015   Admitting Physician Etta Quill, DO  Outpatient Primary MD for the patient is Rochel Brome, MD  LOS - 22  Outpatient Specialists:   Chief Complaint  Patient presents with  . 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 Patient presents to ED with c/o 'his toe is dead'. Creatinine baseline appears to be 1.8-2.3.  Primary nephrologist is Dr. Justin Mend.  Saw him on 06/26/2015 with BUN 27, creatinine 1.88.  Admitted on 10/28/2015 with necrotic left great toe with cellulitis.  Creatinine on admission was 2.6.  Seen by [name] in consult on 7/25.  His Prograf level was found to be low and was increased.  Patient underwent abdominal aortogram with angioplasty of the left anterior tibial artery and left popliteal artery on 11/05/2015.  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  He was on Vancomycin and Zosyn from 7/24-8/3.  Currently only on Zosyn.  He developed some dyspnea in the setting of being positive about 13 liters since admission with evidence of volume overload on exam. Worsening renal failure. Placement of temporary hemodialysis catheter on 8/10. Now started on HD. Will follow clinical response and renal function recovery.   Subjective:    Mark [name]  Afebrile. In no distress. Reports breathing has remained stable. Continue to have swelling (But improved). Patient w/o  complaints of CP, palpitations, nausea or vomiting. Reports appetite is poor.    Assessment  & Plan :    Principal Problem:   Gangrene of toe (HCC) Active Problems:   Type II diabetes mellitus, uncontrolled ([place])  Renal transplant recipient   Diabetic ulcer of left great toe ([place])   Cellulitis of great toe of left foot   Aspiration of liquid   SOB (shortness of breath)   Permanent atrial fibrillation (HCC)   Bibasilar crackles   CHF (congestive heart failure) (HCC)   Melena   Renal failure   Acute on chronic diastolic heart failure -Unable to diurese with the help of diuretics; renal function worsen and has required HD now. -cardiology and nephrology were managing diuretics/fluid overload status with HD now -2-D echo did not show any wall motion abnormalities and the patient does not appear to have any ischemic changes on his EKG -Renal allograft function worse today with Creatinine 3.7 -Per nephrology rec's will continue HD for now -patient is still oliguric -will follow daily weights and strict intake and output  Gangrene of left  Toe/Toe Amputation: -S /P atherectomy and DCB Left popliteal artery and PTA left ATA (POD 5) -S/P Ray amputation of left great toe (POD 4) -Palpable Left popliteal pulse; wound is healing properly -vascular surgery on board. -patient has completed antibiotic therapy  Anemia of chronic diseases,  -FOBT positive, but no signs of acute bleeding appreciated -with ongoing hemodilution from anasarca/fluid overload, Hgb has remained stable around 10.5 for several days.  -Continue PPI twice a day -follow Hgb trend   Diarrhea-C. difficile negative 2 during this admission -will continue probiotic -PRN lomotil ordered  -No fever, no nausea, no vomiting, no abdominal pain. -symptoms improved according to patient   Acute on Chronic kidney disease stage IV, status post renal transplant , baseline creatinine around 1.7, now up to 4.9 and with  oliguria -thought to be acute process caused by contrast  -will continue tacrolimus -anticipation of potential permanent dialysis catheter and AVF access later today (8/15) -renal service on board, will follow rec's -patient remained oliguric and HD dependent currently.  A fib with aberrancy -  -continue b-blocker for rate controlled -on coumadin for anticoagulation (dose per pharmacy); on hold for placement of permanent HD catheter and AVF (procedures plan for later today 11/19/15). Vascular surgery to clear for resumption on coumadin. -cardiology on board, will follow rec's -CHADsVASC score 4  DM2 -  uncontrolled -Hemoglobin A1c 8.2 -will continue SSI, meal coverage and lantus  Hypertension: uncontrolled -Continue norvasc and metoprolol -Prn hydralazine also ordered -HD will also help with BP  Hypothyroidism -continue Synthroid  Protein Calorie Malnutrition -will continue Prostat -last albumin 2.1 -nutritional service consulted, will follow rec's  Dyslipidemia-continue Crestor  Pulmonary hypertension:  [name] has moderate pulmonary hypertension.    -Cardiology Recommending outpatient sleep study and PFTs.   -He does have grade 2 diastolic dysfunction and will benefit of volume control -currently needing HD -will follow cardiology and renal service rec's  Code Status : FULL CODE  Family Communication  :   Disposition Plan  :  ? Will likely need SNF at discharge; will follow PT evaluation.  Barriers For Discharge : worsening renal function, and the patient on dialysis now  Consults  :  Vascular surgery, nephrology, cardiology  Procedures  : Atherectomy and drug coated balloon angioplasty 8/1 , left ray amputation 8/2  DVT Prophylaxis  :  Heparin     Lab Results  Component Value Date   PLT 170 11/19/2015    Antibiotics  :     10/28/15 2200  piperacillin-tazobactam (ZOSYN) IVPB 3.375 g  Status:  Discontinued     3.375 g 12.5 mL/hr over 240 Minutes  Intravenous  Every 8 hours 10/28/15 2120 11/09/15 1057   10/28/15 2200  vancomycin (VANCOCIN) 2,500 mg in sodium chloride 0.9 % 500 mL IVPB     2,500 mg 250 mL/hr over 120 Minutes Intravenous  Once 10/28/15 2154 10/29/15 2330        Objective:   Vitals:   11/19/15 1000 11/19/15 1006 11/19/15 1007 11/19/15 1015  BP:  (!) 153/63    Pulse: 73 82  (!) 57  Resp: (!) 28 (!) 24  17  Temp:   97.4 F (36.3 C)   TempSrc:      SpO2: 100% 97%  98%  Weight:      Height:        Wt Readings from Last 3 Encounters:  11/18/15 96.8 kg (213 lb 6.5 oz)  09/05/15 103.1 kg (227 lb 3.2 oz)  08/05/15 101.8 kg (224 lb 6.4 oz)     Intake/Output Summary (Last 24 hours) at 11/19/15 1048 Last data filed at 11/19/15 0924  Gross per 24 hour  Intake              320 ml  Output             3550 ml  Net            -3230 ml     Physical Exam General: AAOX3, in no acute distress. Seen prior to leaving room for AVF and permanent catheter placement. Denies CP and palpitations. Patient reported breathing is ok. Normal affect.  Patient continued to be oliguric.  Head/eyes: Edgar/AT,PERRLA, no icterus Neck:Supple Neck, right IJ HD catheter in place; No cervical lymphadenopathy appriciated.  Lungs: Symmetrical Chest wall movement, Good air movement bilaterally, decrease breath sounds at bases, no wheezing  Heart: rate controlled. Positive A. Fib on telemetry, no rubs or gallops Abdomen: +ve B.Sounds, Abd Soft, No tenderness, No organomegaly appriciated, No rebound - guarding or rigidity. Extremities: Diffuse anasarca, 2-3+ pitting edema throughout; left ray amputation, with clean dressing and just minimal serosanguineous drainage.   Data Review:    CBC  Recent Labs Lab 11/14/15 1845 11/15/15 1313 11/16/15 1443 11/18/15 0350 11/19/15 0027  WBC 7.7 8.2 7.8 9.5 6.9  HGB 10.7* 10.1* 10.3* 10.8* 10.7*  HCT 32.1* 29.9* 30.4* 32.8* 32.6*  PLT 304 267 246 190 170  MCV 94.7 95.8 95.9 94.0 98.5  MCH 31.6  32.4 32.5 30.9 32.3  MCHC 33.3 33.8 33.9 32.9 32.8  RDW 16.9* 16.9* 16.9* 17.2* 17.4*    Chemistries   Recent Labs Lab 11/14/15 0430  11/15/15 1313 11/16/15 0415 11/17/15 1203 11/18/15 0350 11/19/15 0027  NA 135  < > 133* 134* 129* 132* 132*  K 4.4  < > 4.1 3.9 3.9 3.8 4.0  CL 103  < > 99* 97* 97* 98* 100*  CO2 21*  < > 24 27 26 25 27   GLUCOSE 209*  < > 140* 124* 237* 109* 159*  BUN 109*  < > 72* 43* 34* 42* 22*  CREATININE 4.53*  < > 4.09* 3.40* 3.70* 4.05* 3.33*  CALCIUM 8.7*  < > 8.0* 8.0* 7.8* 8.0* 7.8*  AST 20  --   --   --   --   --   --   ALT 19  --   --   --   --   --   --   ALKPHOS 116  --   --   --   --   --   --   BILITOT 0.8  --   --   --   --   --   --   < > =  values in this interval not displayed. ------------------------------------------------------------------------------------------------------------------ No results for input(s): CHOL, HDL, LDLCALC, TRIG, CHOLHDL, LDLDIRECT in the last 72 hours.  Lab Results  Component Value Date   HGBA1C 8.2 (H) 10/29/2015   ------------------------------------------------------------------------------------------------------------------ No results for input(s): TSH, T4TOTAL, T3FREE, THYROIDAB in the last 72 hours.  Invalid input(s): FREET3 ------------------------------------------------------------------------------------------------------------------ No results for input(s): VITAMINB12, FOLATE, FERRITIN, TIBC, IRON, RETICCTPCT in the last 72 hours.  Coagulation profile  Recent Labs Lab 11/15/15 0303 11/16/15 0415 11/17/15 1203 11/18/15 0350 11/19/15 0027  INR 3.14 2.29 1.76 1.84 1.75    Cardiac Enzymes No results for input(s): CKMB, TROPONINI, MYOGLOBIN in the last 168 hours.  Invalid input(s): CK ------------------------------------------------------------------------------------------------------------------ No results found for: BNP  Inpatient Medications  Scheduled Meds: . allopurinol  150 mg  Oral Daily  . aspirin  81 mg Oral Daily  . calcitRIOL  0.5 mcg Oral Daily  . docusate sodium  100 mg Oral Daily  . famotidine  20 mg Oral QHS  . feeding supplement  1 Container Oral TID WC  . hydrALAZINE  25 mg Oral Q8H  . insulin aspart  0-15 Units Subcutaneous TID WC  . insulin aspart  5 Units Subcutaneous TID WC  . insulin glargine  20 Units Subcutaneous QHS  . levothyroxine  137 mcg Oral QAC breakfast  . magnesium oxide  400 mg Oral BID  . metoprolol succinate  100 mg Oral Daily  . pantoprazole  40 mg Oral BID  . predniSONE  5 mg Oral Daily  . rosuvastatin  10 mg Oral QPM  . saccharomyces boulardii  250 mg Oral BID  . tacrolimus  1 mg Oral BID   Continuous Infusions: . heparin Stopped (11/19/15 0640)   PRN Meds:.alum & mag hydroxide-simeth, diphenoxylate-atropine, guaiFENesin-dextromethorphan, heparin, hydrALAZINE, HYDROcodone-acetaminophen, labetalol, levalbuterol, lidocaine, metoprolol, ondansetron (ZOFRAN) IV, phenol  Micro Results Recent Results (from the past 240 hour(s))  C difficile quick scan w PCR reflex     Status: None   Collection Time: 11/09/15  4:08 PM  Result Value Ref Range Status   C Diff antigen NEGATIVE NEGATIVE Final   C Diff toxin NEGATIVE NEGATIVE Final   C Diff interpretation No C. difficile detected.  Final  Surgical pcr screen     Status: Abnormal   Collection Time: 11/19/15  5:44 AM  Result Value Ref Range Status   MRSA, PCR NEGATIVE NEGATIVE Final   Staphylococcus aureus POSITIVE (A) NEGATIVE Final    Comment:        The Xpert SA Assay (FDA approved for NASAL specimens in patients over 53 years of age), is one component of a comprehensive surveillance program.  Test performance has been validated by [facility] for patients greater than or equal to 35 year old. It is not intended to diagnose infection nor to guide or monitor treatment.     Radiology Reports Ir Fluoro Guide Cv Line Right  Result Date: 11/14/2015 INDICATION: Acute  kidney injury and needs hemodialysis. Plan for placement of non tunneled catheter due to elevated INR level. EXAM: FLUOROSCOPIC AND ULTRASOUND GUIDED PLACEMENT OF A NON TUNNELED DIALYSIS CATHETER Physician: Stephan Minister. Henn, MD MEDICATIONS: None ANESTHESIA/SEDATION: None FLUOROSCOPY TIME:  Fluoroscopy Time: 24 seconds COMPLICATIONS: None immediate. PROCEDURE: Informed consent was obtained for placement of a non tunneled dialysis catheter. The patient was placed supine on the interventional table. Ultrasound confirmed a patent right internal jugular vein. Ultrasound images were obtained for documentation. The right side of the neck was prepped and draped in a sterile  fashion. The right neck was anesthetized with 1% lidocaine. Maximal barrier sterile technique was utilized including caps, mask, sterile gowns, sterile gloves, sterile drape, hand hygiene and skin antiseptic. A small incision was made with #11 blade scalpel. A 21 gauge needle directed into the right internal jugular vein with ultrasound guidance. A micropuncture dilator set was placed. 20 cm Mahurkar catheter was selected. J wire was advanced into the IVC. Tract was dilated to accommodate the catheter. Catheter tip placed near the superior cavoatrial junction. All the lumens aspirated and flushed well. Appropriate amount of heparin was placed in the dialysis lumens. Catheter was sutured to the skin with Prolene suture. Fluoroscopic and ultrasound images were taken and saved for documentation. FINDINGS: Patent right internal jugular vein. Catheter tip near the superior cavoatrial junction. IMPRESSION: Successful placement of a non tunneled dialysis catheter using ultrasound and fluoroscopic guidance. Electronically Signed   By: Markus Daft M.D.   On: 11/14/2015 19:52   Ir US Guide Vasc Access Right  Result Date: 11/14/2015 INDICATION: Acute kidney injury and needs hemodialysis. Plan for placement of non tunneled catheter due to elevated INR level. EXAM:  FLUOROSCOPIC AND ULTRASOUND GUIDED PLACEMENT OF A NON TUNNELED DIALYSIS CATHETER Physician: Stephan Minister. Henn, MD MEDICATIONS: None ANESTHESIA/SEDATION: None FLUOROSCOPY TIME:  Fluoroscopy Time: 24 seconds COMPLICATIONS: None immediate. PROCEDURE: Informed consent was obtained for placement of a non tunneled dialysis catheter. The patient was placed supine on the interventional table. Ultrasound confirmed a patent right internal jugular vein. Ultrasound images were obtained for documentation. The right side of the neck was prepped and draped in a sterile fashion. The right neck was anesthetized with 1% lidocaine. Maximal barrier sterile technique was utilized including caps, mask, sterile gowns, sterile gloves, sterile drape, hand hygiene and skin antiseptic. A small incision was made with #11 blade scalpel. A 21 gauge needle directed into the right internal jugular vein with ultrasound guidance. A micropuncture dilator set was placed. 20 cm Mahurkar catheter was selected. J wire was advanced into the IVC. Tract was dilated to accommodate the catheter. Catheter tip placed near the superior cavoatrial junction. All the lumens aspirated and flushed well. Appropriate amount of heparin was placed in the dialysis lumens. Catheter was sutured to the skin with Prolene suture. Fluoroscopic and ultrasound images were taken and saved for documentation. FINDINGS: Patent right internal jugular vein. Catheter tip near the superior cavoatrial junction. IMPRESSION: Successful placement of a non tunneled dialysis catheter using ultrasound and fluoroscopic guidance. Electronically Signed   By: Markus Daft M.D.   On: 11/14/2015 19:52   Dg Chest Port 1 View  Result Date: 11/19/2015 CLINICAL DATA:  As post dialysis catheter placement EXAM: PORTABLE CHEST 1 VIEW COMPARISON:  11/09/2015 FINDINGS: Previously seen temper a catheter is been removed in a new right jugular dialysis catheter placed in satisfactory position. Cardiac shadow is  within normal limits. Vascular congestion is noted likely related to volume overload. No sizable effusion is seen. IMPRESSION: Vascular congestion likely related to volume overload. No pneumothorax is noted. Electronically Signed   By: Inez Catalina M.D.   On: 11/19/2015 10:03   Dg Chest Port 1 View  Result Date: 11/09/2015 CLINICAL DATA:  Shortness of breath EXAM: PORTABLE CHEST 1 VIEW COMPARISON:  November 06, 2015 FINDINGS: Mild cardiomegaly. The hila and mediastinum are unchanged. Diffuse bilateral interstitial opacities again identified and similar in the interval. The opacity is a little more focal in the left suprahilar region which is increased since November 05, 2015 but unchanged  since November 06, 2015. No other acute interval changes. There may be tiny bilateral effusions with blunting of the costophrenic angles. IMPRESSION: 1. Suspected tiny bilateral pleural effusions and diffuse increased interstitial markings are most consistent with pulmonary edema. Atypical viral infection could have a similar appearance. Recommend clinical correlation. *Opacity in the left suprahilar region is a little more focal when compared November 05, 2015. The difference could be due to confluence of shadows. Recommend follow-up to resolution. Electronically Signed   By: Dorise Bullion III M.D   On: 11/09/2015 09:14   Dg Chest Port 1 View  Result Date: 11/06/2015 CLINICAL DATA:  75 year old male with increasing shortness of breath and abnormal left side pulmonary auscultation. Initial encounter. EXAM: PORTABLE CHEST 1 VIEW COMPARISON:  0508 hours today and earlier. FINDINGS: Portable AP semi upright view at 2127 hours. Continued and mildly increased perihilar and basilar predominant interstitial pulmonary opacity. Stable cardiac size and mediastinal contours. No pneumothorax. No pleural effusion or consolidation identified. IMPRESSION: Increased bilateral pulmonary interstitial opacity. Top differential considerations include  progression of interstitial edema versus viral/ atypical respiratory infection. No pleural effusion identified. Electronically Signed   By: Genevie Ann M.D.   On: 11/06/2015 21:42   Dg Chest Port 1 View  Result Date: 11/06/2015 CLINICAL DATA:  75 year old male with aspiration of liquid. EXAM: PORTABLE CHEST 1 VIEW COMPARISON:  Chest radiograph dated 11/05/2015 FINDINGS: Mild cardiomegaly with central vascular congestion. Bibasilar Interstitial prominence and nodular densities, new from prior study may represent atelectatic changes versus infiltrate. There is no pleural effusion or pneumothorax. Top-normal cardiac silhouette. No acute osseous pathology. IMPRESSION: Mild congestive changes with bibasilar subsegmental atelectasis versus infiltrate. Electronically Signed   By: Anner Crete M.D.   On: 11/06/2015 05:16   Dg Chest Port 1 View  Result Date: 11/05/2015 CLINICAL DATA:  Shortness of breath EXAM: PORTABLE CHEST 1 VIEW COMPARISON:  March 14, 2015 FINDINGS: The lungs are clear. Heart is mildly enlarged with pulmonary vascularity within normal limits. No adenopathy. No bone lesions. IMPRESSION: Mild cardiac enlargement.  No edema or consolidation. Electronically Signed   By: Lowella Grip III M.D.   On: 11/05/2015 09:42   Dg Foot Complete Left  Result Date: 10/28/2015 CLINICAL DATA:  Left big toe pain. EXAM: LEFT FOOT - COMPLETE 3+ VIEW COMPARISON:  None. FINDINGS: There is no evidence of fracture or dislocation. No evidence of dystrophic bony changes. There is soft tissue swelling of the distal first digit. Punctate lucencies within the area of soft tissue swelling may represent entrapped fat or potentially foci of gas. Heavy vascular calcifications are seen. IMPRESSION: No evidence of fracture of the left foot. Soft tissue swelling of the distal first digit with punctate lucencies within the area of swelling, which may represent entrapped fat or potentially foci of gas. Please correlate  clinically regarding the possibility of cellulitis. Electronically Signed   By: Fidela Salisbury M.D.   On: 10/28/2015 21:01  Dg Fluoro Guide Cv Line-no Report  Result Date: 11/19/2015 CLINICAL DATA:  FLOURO GUIDE CV LINE Fluoroscopy was utilized by the requesting physician.  No radiographic interpretation.    Time Spent in minutes  30   Barton Dubois M.D on 11/19/2015 at 10:48 AM [phone]

## 2015-11-20 ENCOUNTER — Encounter (HOSPITAL_COMMUNITY): Payer: Self-pay | Admitting: Vascular Surgery

## 2015-11-20 DIAGNOSIS — R339 Retention of urine, unspecified: Secondary | ICD-10-CM

## 2015-11-20 LAB — GLUCOSE, CAPILLARY
GLUCOSE-CAPILLARY: 181 mg/dL — AB (ref 65–99)
Glucose-Capillary: 132 mg/dL — ABNORMAL HIGH (ref 65–99)
Glucose-Capillary: 63 mg/dL — ABNORMAL LOW (ref 65–99)
Glucose-Capillary: 157 mg/dL — ABNORMAL HIGH (ref 65–99)

## 2015-11-20 LAB — HEPARIN LEVEL (UNFRACTIONATED)
Heparin Unfractionated: 0.28 IU/mL — ABNORMAL LOW (ref 0.30–0.70)
Heparin Unfractionated: 0.35 IU/mL (ref 0.30–0.70)

## 2015-11-20 LAB — RENAL FUNCTION PANEL
ALBUMIN: 2.2 g/dL — AB (ref 3.5–5.0)
ANION GAP: 7 (ref 5–15)
BUN: 34 mg/dL — ABNORMAL HIGH (ref 6–20)
CALCIUM: 8.3 mg/dL — AB (ref 8.9–10.3)
CO2: 24 mmol/L (ref 22–32)
CREATININE: 4.38 mg/dL — AB (ref 0.61–1.24)
Chloride: 100 mmol/L — ABNORMAL LOW (ref 101–111)
GFR, EST AFRICAN AMERICAN: 14 mL/min — AB (ref >60)
GFR, EST NON AFRICAN AMERICAN: 12 mL/min — AB (ref >60)
GLUCOSE: 104 mg/dL — AB (ref 65–99)
POTASSIUM: 3.9 mmol/L (ref 3.5–5.1)
Phosphorus: 5.9 mg/dL — ABNORMAL HIGH (ref 2.5–4.6)
SODIUM: 131 mmol/L — AB (ref 135–145)

## 2015-11-20 LAB — CBC
HEMATOCRIT: 33.8 % — AB (ref 39.0–52.0)
HEMOGLOBIN: 10.8 g/dL — AB (ref 13.0–17.0)
MCH: 30.9 pg (ref 26.0–34.0)
MCHC: 32 g/dL (ref 30.0–36.0)
MCV: 96.6 fL (ref 78.0–100.0)
Platelets: 166 10*3/uL (ref 150–400)
RBC: 3.5 MIL/uL — ABNORMAL LOW (ref 4.22–5.81)
RDW: 17.6 % — AB (ref 11.5–15.5)
WBC: 8.1 10*3/uL (ref 4.0–10.5)

## 2015-11-20 LAB — PROTIME-INR
INR: 1.69
Prothrombin Time: 20.1 seconds — ABNORMAL HIGH (ref 11.4–15.2)

## 2015-11-20 MED ORDER — WARFARIN SODIUM 5 MG PO TABS
5.0000 mg | ORAL_TABLET | Freq: Once | ORAL | Status: AC
  Administered 2015-11-20: 5 mg via ORAL
  Filled 2015-11-20: qty 1

## 2015-11-20 MED ORDER — TAMSULOSIN HCL 0.4 MG PO CAPS
0.4000 mg | ORAL_CAPSULE | Freq: Every day | ORAL | Status: DC
  Administered 2015-11-20 – 2015-11-25 (×6): 0.4 mg via ORAL
  Filled 2015-11-20 (×6): qty 1

## 2015-11-20 MED ORDER — WARFARIN - PHARMACIST DOSING INPATIENT
Freq: Every day | Status: DC
Start: 2015-11-20 — End: 2015-11-26
  Administered 2015-11-22: 17:00:00

## 2015-11-20 NOTE — Consult Note (Signed)
Urology Consult  Referring physician: D Elgergawy Reason for referral: Urine retention  Chief Complaint: Urine retention unable to pass catheter  History of Present Illness: ESRD transplant patient; on coumadin; At fibrillation; admitted with necrotic toe; recent angioplasty; amputation august 2nd; recently SOB; on dialysis now; on heparin drip; recent AV graft; normally voids with reasonable flow and nocturia x 2; was using a condom during admission; no GU surgery on prostate; no UTI; no prostate meds;   Modifying factors: There are no other modifying factors  Associated signs and symptoms: There are no other associated signs and symptoms Aggravating and relieving factors: There are no other aggravating or relieving factors Severity: Moderate Duration: Persistent and not painful     Past Medical History:  Diagnosis Date  . Atrial fibrillation ([place])   . Diabetes ([place])   . Gout   . Hypertension   . Hypothyroidism   . Renal disorder    kidney transplant   . Ulcer of other part of foot 12/15/2013   Past Surgical History:  Procedure Laterality Date  . AMPUTATION Left 11/06/2015   Procedure: AMPUTATION LEFT GREAT TOE;  Surgeon: Elam Dutch, MD;  Location: [place];  Service: Vascular;  Laterality: Left;  . ANKLE FRACTURE SURGERY    . AV FISTULA PLACEMENT Left 11/19/2015   Procedure: LEFT ARM ARTERIOVENOUS (AV) FISTULA CREATION;  Surgeon: Waynetta Sandy, MD;  Location: [place];  Service: Vascular;  Laterality: Left;  . CATARACT EXTRACTION    . INSERTION OF DIALYSIS CATHETER Right 11/19/2015   Procedure: INSERTION OF DIALYSIS CATHETER RIGHT INTERNAL JUGULAR;  Surgeon: Waynetta Sandy, MD;  Location: [place];  Service: Vascular;  Laterality: Right;  . IR GENERIC HISTORICAL  11/14/2015   IR FLUORO GUIDE CV LINE RIGHT 11/14/2015 Markus Daft, MD [facility]  . IR GENERIC HISTORICAL  11/14/2015   IR US GUIDE VASC ACCESS RIGHT 11/14/2015 Markus Daft, MD [facility]  . MELANOMA  EXCISION    . NEPHRECTOMY TRANSPLANTED ORGAN    . PERIPHERAL VASCULAR CATHETERIZATION N/A 11/05/2015   Procedure: Abdominal Aortogram w/Lower Extremity;  Surgeon: Serafina Mitchell, MD;  Location: [place] [facility];  Service: Cardiovascular;  Laterality: N/A;  . PERIPHERAL VASCULAR CATHETERIZATION Left 11/05/2015   Procedure: Peripheral Vascular Atherectomy;  Surgeon: Serafina Mitchell, MD;  Location: [facility] [place] [facility];  Service: Cardiovascular;  Laterality: Left;  popiteal  . PERIPHERAL VASCULAR CATHETERIZATION Left 11/05/2015   Procedure: Peripheral Vascular Balloon Angioplasty;  Surgeon: Serafina Mitchell, MD;  Location: [place] [facility];  Service: Cardiovascular;  Laterality: Left;  anterior tib    Medications: I have reviewed the patient's current medications. Allergies:  Allergies  Allergen Reactions  . Tape Other (See Comments)    SKIN WILL TEAR!!    Family History  Problem Relation Age of Onset  . Bradycardia Mother   . Stroke Father    Social History:  reports that he has never smoked. He has never used smokeless tobacco. He reports that he does not drink alcohol or use drugs.  ROS: All systems are reviewed and negative except as noted. Rest negative  Physical Exam:  Vital signs in last 24 hours: Temp:  [97.4 F (36.3 C)-98.2 F (36.8 C)] 97.4 F (36.3 C) (08/16 1125) Pulse Rate:  [42-94] 94 (08/16 1125) Resp:  [18-21] 20 (08/16 1125) BP: (120-162)/(49-74) 162/66 (08/16 1125) SpO2:  [96 %-100 %] 96 % (08/16 1125) Weight:  [94.3 kg (207 lb 14.3 oz)-100.2 kg (220 lb 14.4 oz)]  94.3 kg (207 lb 14.3 oz) (08/16 1125)  Cardiovascular: Skin warm; not flushed Respiratory: Breaths quiet; no shortness of breath Abdomen: No masses Neurological: Normal sensation to touch Musculoskeletal: Normal motor function arms and legs Lymphatics: No inguinal adenopathy Skin: No rashes Genitourinary: s/p area not distended; male genitalia normal  Laboratory Data:  Results for orders placed  or performed during the hospital encounter of 10/28/15 (from the past 72 hour(s))  Glucose, capillary     Status: Abnormal   Collection Time: 11/17/15  4:27 PM  Result Value Ref Range   Glucose-Capillary 210 (H) 65 - 99 mg/dL  Heparin level (unfractionated)     Status: None   Collection Time: 11/17/15 10:09 PM  Result Value Ref Range   Heparin Unfractionated 0.45 0.30 - 0.70 IU/mL    Comment:        IF HEPARIN RESULTS ARE BELOW EXPECTED VALUES, AND PATIENT DOSAGE HAS BEEN CONFIRMED, SUGGEST FOLLOW UP TESTING OF ANTITHROMBIN III LEVELS.   Protime-INR     Status: Abnormal   Collection Time: 11/18/15  3:50 AM  Result Value Ref Range   Prothrombin Time 21.5 (H) 11.4 - 15.2 seconds   INR 1.84   Renal function panel     Status: Abnormal   Collection Time: 11/18/15  3:50 AM  Result Value Ref Range   Sodium 132 (L) 135 - 145 mmol/L   Potassium 3.8 3.5 - 5.1 mmol/L   Chloride 98 (L) 101 - 111 mmol/L   CO2 25 22 - 32 mmol/L   Glucose, Bld 109 (H) 65 - 99 mg/dL   BUN 42 (H) 6 - 20 mg/dL   Creatinine, Ser 4.05 (H) 0.61 - 1.24 mg/dL   Calcium 8.0 (L) 8.9 - 10.3 mg/dL   Phosphorus 5.0 (H) 2.5 - 4.6 mg/dL   Albumin 2.3 (L) 3.5 - 5.0 g/dL   GFR calc non Af Amer 13 (L) >60 mL/min   GFR calc Af Amer 15 (L) >60 mL/min    Comment: (NOTE) The eGFR has been calculated using the CKD EPI equation. This calculation has not been validated in all clinical situations. eGFR's persistently <60 mL/min signify possible Chronic Kidney Disease.    Anion gap 9 5 - 15  CBC     Status: Abnormal   Collection Time: 11/18/15  3:50 AM  Result Value Ref Range   WBC 9.5 4.0 - 10.5 K/uL   RBC 3.49 (L) 4.22 - 5.81 MIL/uL   Hemoglobin 10.8 (L) 13.0 - 17.0 g/dL   HCT 32.8 (L) 39.0 - 52.0 %   MCV 94.0 78.0 - 100.0 fL   MCH 30.9 26.0 - 34.0 pg   MCHC 32.9 30.0 - 36.0 g/dL   RDW 17.2 (H) 11.5 - 15.5 %   Platelets 190 150 - 400 K/uL  Heparin level (unfractionated)     Status: Abnormal   Collection Time:  11/18/15  3:50 AM  Result Value Ref Range   Heparin Unfractionated 0.78 (H) 0.30 - 0.70 IU/mL    Comment:        IF HEPARIN RESULTS ARE BELOW EXPECTED VALUES, AND PATIENT DOSAGE HAS BEEN CONFIRMED, SUGGEST FOLLOW UP TESTING OF ANTITHROMBIN III LEVELS.   Glucose, capillary     Status: None   Collection Time: 11/18/15  5:53 AM  Result Value Ref Range   Glucose-Capillary 71 65 - 99 mg/dL  Glucose, capillary     Status: None   Collection Time: 11/18/15  8:30 AM  Result Value Ref Range   Glucose-Capillary  93 65 - 99 mg/dL  Heparin level (unfractionated)     Status: Abnormal   Collection Time: 11/18/15  1:10 PM  Result Value Ref Range   Heparin Unfractionated 1.18 (H) 0.30 - 0.70 IU/mL    Comment: RESULTS CONFIRMED BY MANUAL DILUTION        IF HEPARIN RESULTS ARE BELOW EXPECTED VALUES, AND PATIENT DOSAGE HAS BEEN CONFIRMED, SUGGEST FOLLOW UP TESTING OF ANTITHROMBIN III LEVELS.   Glucose, capillary     Status: Abnormal   Collection Time: 11/18/15  1:37 PM  Result Value Ref Range   Glucose-Capillary 108 (H) 65 - 99 mg/dL   Comment 1 Notify RN    Comment 2 Document in Chart   Glucose, capillary     Status: Abnormal   Collection Time: 11/18/15  4:28 PM  Result Value Ref Range   Glucose-Capillary 170 (H) 65 - 99 mg/dL   Comment 1 Notify RN    Comment 2 Document in Chart   Glucose, capillary     Status: Abnormal   Collection Time: 11/18/15  9:33 PM  Result Value Ref Range   Glucose-Capillary 183 (H) 65 - 99 mg/dL   Comment 1 Notify RN   Heparin level (unfractionated)     Status: None   Collection Time: 11/19/15 12:27 AM  Result Value Ref Range   Heparin Unfractionated 0.45 0.30 - 0.70 IU/mL    Comment:        IF HEPARIN RESULTS ARE BELOW EXPECTED VALUES, AND PATIENT DOSAGE HAS BEEN CONFIRMED, SUGGEST FOLLOW UP TESTING OF ANTITHROMBIN III LEVELS.   Protime-INR     Status: Abnormal   Collection Time: 11/19/15 12:27 AM  Result Value Ref Range   Prothrombin Time 20.6 (H)  11.4 - 15.2 seconds   INR 1.75   CBC     Status: Abnormal   Collection Time: 11/19/15 12:27 AM  Result Value Ref Range   WBC 6.9 4.0 - 10.5 K/uL   RBC 3.31 (L) 4.22 - 5.81 MIL/uL   Hemoglobin 10.7 (L) 13.0 - 17.0 g/dL   HCT 32.6 (L) 39.0 - 52.0 %   MCV 98.5 78.0 - 100.0 fL   MCH 32.3 26.0 - 34.0 pg   MCHC 32.8 30.0 - 36.0 g/dL   RDW 17.4 (H) 11.5 - 15.5 %   Platelets 170 150 - 400 K/uL  Basic metabolic panel     Status: Abnormal   Collection Time: 11/19/15 12:27 AM  Result Value Ref Range   Sodium 132 (L) 135 - 145 mmol/L   Potassium 4.0 3.5 - 5.1 mmol/L   Chloride 100 (L) 101 - 111 mmol/L   CO2 27 22 - 32 mmol/L   Glucose, Bld 159 (H) 65 - 99 mg/dL   BUN 22 (H) 6 - 20 mg/dL   Creatinine, Ser 3.33 (H) 0.61 - 1.24 mg/dL   Calcium 7.8 (L) 8.9 - 10.3 mg/dL   GFR calc non Af Amer 17 (L) >60 mL/min   GFR calc Af Amer 19 (L) >60 mL/min    Comment: (NOTE) The eGFR has been calculated using the CKD EPI equation. This calculation has not been validated in all clinical situations. eGFR's persistently <60 mL/min signify possible Chronic Kidney Disease.    Anion gap 5 5 - 15  Glucose, capillary     Status: Abnormal   Collection Time: 11/19/15  5:10 AM  Result Value Ref Range   Glucose-Capillary 111 (H) 65 - 99 mg/dL   Comment 1 Notify RN   Surgical  pcr screen     Status: Abnormal   Collection Time: 11/19/15  5:44 AM  Result Value Ref Range   MRSA, PCR NEGATIVE NEGATIVE   Staphylococcus aureus POSITIVE (A) NEGATIVE    Comment:        The Xpert SA Assay (FDA approved for NASAL specimens in patients over 28 years of age), is one component of a comprehensive surveillance program.  Test performance has been validated by [facility] for patients greater than or equal to 98 year old. It is not intended to diagnose infection nor to guide or monitor treatment.   Glucose, capillary     Status: Abnormal   Collection Time: 11/19/15  9:37 AM  Result Value Ref Range    Glucose-Capillary 101 (H) 65 - 99 mg/dL   Comment 1 Notify RN    Comment 2 Document in Chart   Glucose, capillary     Status: Abnormal   Collection Time: 11/19/15 11:20 AM  Result Value Ref Range   Glucose-Capillary 105 (H) 65 - 99 mg/dL  Glucose, capillary     Status: Abnormal   Collection Time: 11/19/15  4:15 PM  Result Value Ref Range   Glucose-Capillary 300 (H) 65 - 99 mg/dL  Heparin level (unfractionated)     Status: Abnormal   Collection Time: 11/19/15  9:09 PM  Result Value Ref Range   Heparin Unfractionated 0.27 (L) 0.30 - 0.70 IU/mL    Comment:        IF HEPARIN RESULTS ARE BELOW EXPECTED VALUES, AND PATIENT DOSAGE HAS BEEN CONFIRMED, SUGGEST FOLLOW UP TESTING OF ANTITHROMBIN III LEVELS.   Glucose, capillary     Status: Abnormal   Collection Time: 11/19/15  9:24 PM  Result Value Ref Range   Glucose-Capillary 323 (H) 65 - 99 mg/dL   Comment 1 Notify RN   Protime-INR     Status: Abnormal   Collection Time: 11/20/15  5:00 AM  Result Value Ref Range   Prothrombin Time 20.1 (H) 11.4 - 15.2 seconds   INR 1.69   CBC     Status: Abnormal   Collection Time: 11/20/15  5:00 AM  Result Value Ref Range   WBC 8.1 4.0 - 10.5 K/uL   RBC 3.50 (L) 4.22 - 5.81 MIL/uL   Hemoglobin 10.8 (L) 13.0 - 17.0 g/dL   HCT 33.8 (L) 39.0 - 52.0 %   MCV 96.6 78.0 - 100.0 fL   MCH 30.9 26.0 - 34.0 pg   MCHC 32.0 30.0 - 36.0 g/dL   RDW 17.6 (H) 11.5 - 15.5 %   Platelets 166 150 - 400 K/uL  Heparin level (unfractionated)     Status: Abnormal   Collection Time: 11/20/15  5:00 AM  Result Value Ref Range   Heparin Unfractionated 0.28 (L) 0.30 - 0.70 IU/mL    Comment:        IF HEPARIN RESULTS ARE BELOW EXPECTED VALUES, AND PATIENT DOSAGE HAS BEEN CONFIRMED, SUGGEST FOLLOW UP TESTING OF ANTITHROMBIN III LEVELS.   Glucose, capillary     Status: Abnormal   Collection Time: 11/20/15  6:20 AM  Result Value Ref Range   Glucose-Capillary 132 (H) 65 - 99 mg/dL   Comment 1 Notify RN   Renal  function panel     Status: Abnormal   Collection Time: 11/20/15  7:30 AM  Result Value Ref Range   Sodium 131 (L) 135 - 145 mmol/L   Potassium 3.9 3.5 - 5.1 mmol/L   Chloride 100 (L) 101 - 111  mmol/L   CO2 24 22 - 32 mmol/L   Glucose, Bld 104 (H) 65 - 99 mg/dL   BUN 34 (H) 6 - 20 mg/dL   Creatinine, Ser 4.38 (H) 0.61 - 1.24 mg/dL   Calcium 8.3 (L) 8.9 - 10.3 mg/dL   Phosphorus 5.9 (H) 2.5 - 4.6 mg/dL   Albumin 2.2 (L) 3.5 - 5.0 g/dL   GFR calc non Af Amer 12 (L) >60 mL/min   GFR calc Af Amer 14 (L) >60 mL/min    Comment: (NOTE) The eGFR has been calculated using the CKD EPI equation. This calculation has not been validated in all clinical situations. eGFR's persistently <60 mL/min signify possible Chronic Kidney Disease.    Anion gap 7 5 - 15  Glucose, capillary     Status: Abnormal   Collection Time: 11/20/15 12:31 PM  Result Value Ref Range   Glucose-Capillary 63 (L) 65 - 99 mg/dL   Comment 1 Notify RN    Comment 2 Document in Chart   Heparin level (unfractionated)     Status: None   Collection Time: 11/20/15  2:41 PM  Result Value Ref Range   Heparin Unfractionated 0.35 0.30 - 0.70 IU/mL    Comment:        IF HEPARIN RESULTS ARE BELOW EXPECTED VALUES, AND PATIENT DOSAGE HAS BEEN CONFIRMED, SUGGEST FOLLOW UP TESTING OF ANTITHROMBIN III LEVELS.    Recent Results (from the past 240 hour(s))  Surgical pcr screen     Status: Abnormal   Collection Time: 11/19/15  5:44 AM  Result Value Ref Range Status   MRSA, PCR NEGATIVE NEGATIVE Final   Staphylococcus aureus POSITIVE (A) NEGATIVE Final    Comment:        The Xpert SA Assay (FDA approved for NASAL specimens in patients over 35 years of age), is one component of a comprehensive surveillance program.  Test performance has been validated by [facility] for patients greater than or equal to 67 year old. It is not intended to diagnose infection nor to guide or monitor treatment.    Creatinine:  Recent Labs   11/15/15 1153 11/15/15 1313 11/16/15 0415 11/17/15 1203 11/18/15 0350 11/19/15 0027 11/20/15 0730  CREATININE 4.16* 4.09* 3.40* 3.70* 4.05* 3.33* 4.38*    Xrays: See report/chart none  Impression/Assessment:  Retention; ? Poor bladder contractility vs BOO  Plan:  Foley 14 Fr easily inserted Please start flomax  Remove foley and give trial of voiding 1 day prior to discharge If any issues call us and/or f/up with me in clinic  , A 11/20/2015, 4:15 PM

## 2015-11-20 NOTE — Progress Notes (Signed)
Per report and per Pt, greater than 651ml in bladder from scan this morning before dialysis.  Attempted in and out cath once returned from HD, and was unsuccessful.  MD aware, urology consulted.  Cart obtained from OR per request.  Pt undated.  Will con't plan of care.

## 2015-11-20 NOTE — Progress Notes (Signed)
Offered to help Pt to chair but he declines at this time, stating "I'm wiped out."  Will con't plan of care.

## 2015-11-20 NOTE — Progress Notes (Addendum)
ANTICOAGULATION CONSULT NOTE - Follow Up Consult  Pharmacy Consult for Heparin/Coumadin Indication: afib   Allergies  Allergen Reactions  . Tape Other (See Comments)    SKIN WILL TEAR!!    Patient Measurements: Height: 5\' 9"  (175.3 cm) Weight: 207 lb 14.3 oz (94.3 kg) IBW/kg (Calculated) : 70.7  HDWt: 92kg  Vital Signs: Temp: 97.4 F (36.3 C) (08/16 1125) Temp Source: Oral (08/16 1125) BP: 162/66 (08/16 1125) Pulse Rate: 94 (08/16 1125)  Labs:  Recent Labs  11/18/15 0350  11/19/15 0027 11/19/15 2109 11/20/15 0500 11/20/15 0730  HGB 10.8*  --  10.7*  --  10.8*  --   HCT 32.8*  --  32.6*  --  33.8*  --   PLT 190  --  170  --  166  --   LABPROT 21.5*  --  20.6*  --  20.1*  --   INR 1.84  --  1.75  --  1.69  --   HEPARINUNFRC 0.78*  < > 0.45 0.27* 0.28*  --   CREATININE 4.05*  --  3.33*  --   --  4.38*  < > = values in this interval not displayed.  Estimated Creatinine Clearance: 16.5 mL/min (by C-G formula based on SCr of 4.38 mg/dL).   Assessment: [name] w/ warfarin PTA for AFib. Now s/p arteriogram 8/1 with successful arthrectomy and balloon angioplasty. Heparin restarted following left great toe amputation 8/2 and d/c'd 8/6. Was on warfarin, but then needed to be held for fistula creation. Heparin restarted 8/15 post-op, warfarin to resume 8/16. INR 1.69 this AM. Hg low stable, plt wnl, no bleed documented.  Warfarin PTA dosing: 5mg  daily except 2.5mg  on Tuesdays and Thursdays.   Goal of Therapy:  Heparin level 0.3-0.7 units/ml  INR 2-3 Monitor platelets by anticoagulation protocol: Yes    Plan:  -Heparin at 1050 units/h -Warfarin 5mg  x 1 dose tonight -8h HL, daily HL/INR/CBC -Monitor s/sx bleeding  Elicia Lamp, PharmD, BCPS Clinical Pharmacist Pager [phone] 11/20/2015 1:54 PM

## 2015-11-20 NOTE — Progress Notes (Signed)
Patient ID: Mark [name], male   DOB: [birth year], 75 y.o.   MRN: [id number]                                                                PROGRESS NOTE                                                                                                                                                                                                             Patient Demographics:    Mark [name], is a 75 y.o. male, DOB - 10-06-1940, [id number]  Admit date - 10/28/2015   Admitting Physician Mark Quill, DO  Outpatient Primary MD for the patient is Mark Brome, MD  LOS - 23  Outpatient Specialists:   Chief Complaint  Patient presents with  . 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 Patient presents to ED with c/o 'his toe is dead'. Creatinine baseline appears to be 1.8-2.3.  Primary nephrologist is Dr. Justin [name].  Saw him on 06/26/2015 with BUN 27, creatinine 1.88.  Admitted on 10/28/2015 with necrotic left great toe with cellulitis.  Creatinine on admission was 2.6.  Seen by Dr. Jimmy [name] in consult on 7/25.  His Prograf level was found to be low and was increased.  Patient underwent abdominal aortogram with angioplasty of the left anterior tibial artery and left popliteal artery on 11/05/2015.  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  He was on Vancomycin and Zosyn from 7/24-8/3.  Currently only on Zosyn.  He developed some dyspnea in the setting of being positive about 13 liters since admission with evidence of volume overload on exam. Worsening renal failure. Placement of temporary hemodialysis catheter on 8/10. Now started on HD. Will follow clinical response and renal function recovery.   Subjective:    Mark [name]  Afebrile.Denies any nausea or vomiting, complaints of lower back pain, has urinary retention, unsuccessful in and out by  staff  Assessment  & Plan :    Principal Problem:   Gangrene of toe (Mark [name]) Active Problems:   Type II diabetes mellitus, uncontrolled (Mark [name])   Renal transplant recipient   Diabetic ulcer of left great  toe (Mark [name])   Cellulitis of great toe of left foot   Aspiration of liquid   SOB (shortness of breath)   Permanent atrial fibrillation (Mark [name])   Bibasilar crackles   CHF (congestive heart failure) (Mark [name])   Melena   Renal failure   Acute on chronic diastolic heart failure -Unable to diurese with the help of diuretics; renal function worsen and has required HD now. -cardiology and nephrology were managing diuretics/fluid overload status with HD now -2-D echo did not show any wall motion abnormalities and the patient does not appear to have any ischemic changes on his EKG -Renal allograft function worse today with Creatinine 3.7 -Per nephrology rec's will continue HD for now -patient is still oliguric -will follow daily weights and strict intake and output  Gangrene of left  Toe/Toe Amputation: -S /P atherectomy and DCB Left popliteal artery and PTA left ATA (POD 5) -S/P Ray amputation of left great toe (POD 4) -Palpable Left popliteal pulse; wound is healing properly -vascular surgery on board. -patient has completed antibiotic therapy  Urinary retention  - Bladder scan showing more than 600 mL , staff unable to do in and out , urology consulted . -will start on flomax  Anemia of chronic diseases,  -FOBT positive, but no signs of acute bleeding appreciated -with ongoing hemodilution from anasarca/fluid overload, Hgb has remained stable around 10.5 for several days.  -Continue PPI twice a day -follow Hgb trend   Diarrhea-C. difficile negative 2 during this admission -will continue probiotic -PRN lomotil ordered  -No fever, no nausea, no vomiting, no abdominal pain. -symptoms improved according to patient   Acute on Chronic kidney disease stage IV, status post renal transplant ,  baseline creatinine around 1.7, now up to 4.9 and with oliguria -thought to be acute process caused by contrast  -will continue tacrolimus -anticipation of potential permanent dialysis catheter and AVF access later today (8/15) -renal service on board, will follow rec's -patient remained oliguric and HD dependent currently.  A fib with aberrancy -  -continue b-blocker for rate controlled -on coumadin for anticoagulation (dose per pharmacy); on hold for placement of permanent HD catheter and AVF (procedures plan for later today 11/19/15). Vascular surgery to clear for resumption on coumadin. -cardiology on board, will follow rec's -CHADsVASC score 4  DM2 -  uncontrolled -Hemoglobin A1c 8.2 -will continue SSI, meal coverage and lantus  Hypertension: uncontrolled -Continue norvasc and metoprolol -Prn hydralazine also ordered -HD will also help with BP  Hypothyroidism -continue Synthroid  Protein Calorie Malnutrition -will continue Prostat -last albumin 2.1 -nutritional service consulted, will follow rec's  Dyslipidemia-continue Crestor  Pulmonary hypertension:  Mr. Mark [name] has moderate pulmonary hypertension.    -Cardiology Recommending outpatient sleep study and PFTs.   -He does have grade 2 diastolic dysfunction and will benefit of volume control -currently needing HD -will follow cardiology and renal service rec's  Code Status : FULL CODE  Family Communication  :   Disposition Plan  :  ? Will likely need SNF at discharge; will follow PT evaluation.  Barriers For Discharge : worsening renal function, and the patient on dialysis now  Consults  :  Vascular surgery, nephrology, cardiology  Procedures  : Atherectomy and drug coated balloon angioplasty 8/1 , left ray amputation 8/2  DVT Prophylaxis  :  Heparin     Lab Results  Component Value Date   PLT 166 11/20/2015    Antibiotics  :     10/28/15 2200  piperacillin-tazobactam (ZOSYN) IVPB 3.375  g  Status:   Discontinued     3.375 g 12.5 mL/hr over 240 Minutes Intravenous Every 8 hours 10/28/15 2120 11/09/15 1057   10/28/15 2200  vancomycin (VANCOCIN) 2,500 mg in sodium chloride 0.9 % 500 mL IVPB     2,500 mg 250 mL/hr over 120 Minutes Intravenous  Once 10/28/15 2154 10/29/15 2330        Objective:   Vitals:   11/20/15 1000 11/20/15 1030 11/20/15 1100 11/20/15 1125  BP: 122/70 (!) 143/71 132/61 (!) 162/66  Pulse: (!) 57 66 91 94  Resp:    20  Temp:    97.4 F (36.3 C)  TempSrc:    Oral  SpO2:    96%  Weight:    94.3 kg (207 lb 14.3 oz)  Height:        Wt Readings from Last 3 Encounters:  11/20/15 94.3 kg (207 lb 14.3 oz)  09/05/15 103.1 kg (227 lb 3.2 oz)  08/05/15 101.8 kg (224 lb 6.4 oz)     Intake/Output Summary (Last 24 hours) at 11/20/15 1314 Last data filed at 11/20/15 1125  Gross per 24 hour  Intake             47.5 ml  Output             4450 ml  Net          -4402.5 ml     Physical Exam General: AAOX3, in no acute distress. Head/eyes: Mart/AT,PERRLA, no icterus Neck:Supple Neck,  Lungs: Symmetrical Chest wall movement, Good air movement bilaterally, decrease breath sounds at bases, no wheezing  Heart: rate controlled. Positive A. Fib on telemetry, no rubs or gallops Abdomen: +ve B.Sounds, Abd Soft,Suprapubic tenderness,  No rebound - guarding or rigidity. Extremities: Diffuse anasarca, 2-3+ pitting edema throughout; left ray amputation, with clean dressing and just minimal serosanguineous drainage.   Data Review:    CBC  Recent Labs Lab 11/15/15 1313 11/16/15 1443 11/18/15 0350 11/19/15 0027 11/20/15 0500  WBC 8.2 7.8 9.5 6.9 8.1  HGB 10.1* 10.3* 10.8* 10.7* 10.8*  HCT 29.9* 30.4* 32.8* 32.6* 33.8*  PLT 267 246 190 170 166  MCV 95.8 95.9 94.0 98.5 96.6  MCH 32.4 32.5 30.9 32.3 30.9  MCHC 33.8 33.9 32.9 32.8 32.0  RDW 16.9* 16.9* 17.2* 17.4* 17.6*    Chemistries   Recent Labs Lab 11/14/15 0430  11/16/15 0415 11/17/15 1203 11/18/15 0350  11/19/15 0027 11/20/15 0730  NA 135  < > 134* 129* 132* 132* 131*  K 4.4  < > 3.9 3.9 3.8 4.0 3.9  CL 103  < > 97* 97* 98* 100* 100*  CO2 21*  < > 27 26 25 27 24   GLUCOSE 209*  < > 124* 237* 109* 159* 104*  BUN 109*  < > 43* 34* 42* 22* 34*  CREATININE 4.53*  < > 3.40* 3.70* 4.05* 3.33* 4.38*  CALCIUM 8.7*  < > 8.0* 7.8* 8.0* 7.8* 8.3*  AST 20  --   --   --   --   --   --   ALT 19  --   --   --   --   --   --   ALKPHOS 116  --   --   --   --   --   --   BILITOT 0.8  --   --   --   --   --   --   < > = values in this  interval not displayed. ------------------------------------------------------------------------------------------------------------------ No results for input(s): CHOL, HDL, LDLCALC, TRIG, CHOLHDL, LDLDIRECT in the last 72 hours.  Lab Results  Component Value Date   HGBA1C 8.2 (H) 10/29/2015   ------------------------------------------------------------------------------------------------------------------ No results for input(s): TSH, T4TOTAL, T3FREE, THYROIDAB in the last 72 hours.  Invalid input(s): FREET3 ------------------------------------------------------------------------------------------------------------------ No results for input(s): VITAMINB12, FOLATE, FERRITIN, TIBC, IRON, RETICCTPCT in the last 72 hours.  Coagulation profile  Recent Labs Lab 11/16/15 0415 11/17/15 1203 11/18/15 0350 11/19/15 0027 11/20/15 0500  INR 2.29 1.76 1.84 1.75 1.69    Cardiac Enzymes No results for input(s): CKMB, TROPONINI, MYOGLOBIN in the last 168 hours.  Invalid input(s): CK ------------------------------------------------------------------------------------------------------------------ No results found for: BNP  Inpatient Medications  Scheduled Meds: . allopurinol  150 mg Oral Daily  . aspirin  81 mg Oral Daily  . calcitRIOL  0.5 mcg Oral Daily  . docusate sodium  100 mg Oral Daily  . famotidine  20 mg Oral QHS  . feeding supplement  1 Container Oral  TID WC  . hydrALAZINE  25 mg Oral Q8H  . insulin aspart  0-15 Units Subcutaneous TID WC  . insulin aspart  5 Units Subcutaneous TID WC  . insulin glargine  20 Units Subcutaneous QHS  . levothyroxine  137 mcg Oral QAC breakfast  . magnesium oxide  400 mg Oral BID  . metoprolol succinate  100 mg Oral Daily  . pantoprazole  40 mg Oral BID  . predniSONE  5 mg Oral Daily  . rosuvastatin  10 mg Oral QPM  . saccharomyces boulardii  250 mg Oral BID  . tacrolimus  1 mg Oral BID   Continuous Infusions: . heparin 1,050 Units/hr (11/20/15 0620)   PRN Meds:.alum & mag hydroxide-simeth, diphenoxylate-atropine, guaiFENesin-dextromethorphan, hydrALAZINE, HYDROcodone-acetaminophen, labetalol, levalbuterol, metoprolol, ondansetron (ZOFRAN) IV, oxyCODONE, phenol  Micro Results Recent Results (from the past 240 hour(s))  Surgical pcr screen     Status: Abnormal   Collection Time: 11/19/15  5:44 AM  Result Value Ref Range Status   MRSA, PCR NEGATIVE NEGATIVE Final   Staphylococcus aureus POSITIVE (A) NEGATIVE Final    Comment:        The Xpert SA Assay (FDA approved for NASAL specimens in patients over 60 years of age), is one component of a comprehensive surveillance program.  Test performance has been validated by [facility] for patients greater than or equal to 21 year old. It is not intended to diagnose infection nor to guide or monitor treatment.     Radiology Reports Ir Fluoro Guide Cv Line Right  Result Date: 11/14/2015 INDICATION: Acute kidney injury and needs hemodialysis. Plan for placement of non tunneled catheter due to elevated INR level. EXAM: FLUOROSCOPIC AND ULTRASOUND GUIDED PLACEMENT OF A NON TUNNELED DIALYSIS CATHETER Physician: Stephan Minister. Henn, MD MEDICATIONS: None ANESTHESIA/SEDATION: None FLUOROSCOPY TIME:  Fluoroscopy Time: 24 seconds COMPLICATIONS: None immediate. PROCEDURE: Informed consent was obtained for placement of a non tunneled dialysis catheter. The patient was  placed supine on the interventional table. Ultrasound confirmed a patent right internal jugular vein. Ultrasound images were obtained for documentation. The right side of the neck was prepped and draped in a sterile fashion. The right neck was anesthetized with 1% lidocaine. Maximal barrier sterile technique was utilized including caps, mask, sterile gowns, sterile gloves, sterile drape, hand hygiene and skin antiseptic. A small incision was made with #11 blade scalpel. A 21 gauge needle directed into the right internal jugular vein with ultrasound guidance. A micropuncture dilator set was placed.  20 cm Mahurkar catheter was selected. J wire was advanced into the IVC. Tract was dilated to accommodate the catheter. Catheter tip placed near the superior cavoatrial junction. All the lumens aspirated and flushed well. Appropriate amount of heparin was placed in the dialysis lumens. Catheter was sutured to the skin with Prolene suture. Fluoroscopic and ultrasound images were taken and saved for documentation. FINDINGS: Patent right internal jugular vein. Catheter tip near the superior cavoatrial junction. IMPRESSION: Successful placement of a non tunneled dialysis catheter using ultrasound and fluoroscopic guidance. Electronically Signed   By: Markus Daft M.D.   On: 11/14/2015 19:52   Ir US Guide Vasc Access Right  Result Date: 11/14/2015 INDICATION: Acute kidney injury and needs hemodialysis. Plan for placement of non tunneled catheter due to elevated INR level. EXAM: FLUOROSCOPIC AND ULTRASOUND GUIDED PLACEMENT OF A NON TUNNELED DIALYSIS CATHETER Physician: Stephan Minister. Henn, MD MEDICATIONS: None ANESTHESIA/SEDATION: None FLUOROSCOPY TIME:  Fluoroscopy Time: 24 seconds COMPLICATIONS: None immediate. PROCEDURE: Informed consent was obtained for placement of a non tunneled dialysis catheter. The patient was placed supine on the interventional table. Ultrasound confirmed a patent right internal jugular vein. Ultrasound  images were obtained for documentation. The right side of the neck was prepped and draped in a sterile fashion. The right neck was anesthetized with 1% lidocaine. Maximal barrier sterile technique was utilized including caps, mask, sterile gowns, sterile gloves, sterile drape, hand hygiene and skin antiseptic. A small incision was made with #11 blade scalpel. A 21 gauge needle directed into the right internal jugular vein with ultrasound guidance. A micropuncture dilator set was placed. 20 cm Mahurkar catheter was selected. J wire was advanced into the IVC. Tract was dilated to accommodate the catheter. Catheter tip placed near the superior cavoatrial junction. All the lumens aspirated and flushed well. Appropriate amount of heparin was placed in the dialysis lumens. Catheter was sutured to the skin with Prolene suture. Fluoroscopic and ultrasound images were taken and saved for documentation. FINDINGS: Patent right internal jugular vein. Catheter tip near the superior cavoatrial junction. IMPRESSION: Successful placement of a non tunneled dialysis catheter using ultrasound and fluoroscopic guidance. Electronically Signed   By: Markus Daft M.D.   On: 11/14/2015 19:52   Dg Chest Port 1 View  Result Date: 11/19/2015 CLINICAL DATA:  As post dialysis catheter placement EXAM: PORTABLE CHEST 1 VIEW COMPARISON:  11/09/2015 FINDINGS: Previously seen temper a catheter is been removed in a new right jugular dialysis catheter placed in satisfactory position. Cardiac shadow is within normal limits. Vascular congestion is noted likely related to volume overload. No sizable effusion is seen. IMPRESSION: Vascular congestion likely related to volume overload. No pneumothorax is noted. Electronically Signed   By: Inez Catalina M.D.   On: 11/19/2015 10:03   Dg Chest Port 1 View  Result Date: 11/09/2015 CLINICAL DATA:  Shortness of breath EXAM: PORTABLE CHEST 1 VIEW COMPARISON:  November 06, 2015 FINDINGS: Mild cardiomegaly. The  hila and mediastinum are unchanged. Diffuse bilateral interstitial opacities again identified and similar in the interval. The opacity is a little more focal in the left suprahilar region which is increased since November 05, 2015 but unchanged since November 06, 2015. No other acute interval changes. There may be tiny bilateral effusions with blunting of the costophrenic angles. IMPRESSION: 1. Suspected tiny bilateral pleural effusions and diffuse increased interstitial markings are most consistent with pulmonary edema. Atypical viral infection could have a similar appearance. Recommend clinical correlation. *Opacity in the left suprahilar region is a  little more focal when compared November 05, 2015. The difference could be due to confluence of shadows. Recommend follow-up to resolution. Electronically Signed   By: Dorise Bullion III M.D   On: 11/09/2015 09:14   Dg Chest Port 1 View  Result Date: 11/06/2015 CLINICAL DATA:  75 year old male with increasing shortness of breath and abnormal left side pulmonary auscultation. Initial encounter. EXAM: PORTABLE CHEST 1 VIEW COMPARISON:  0508 hours today and earlier. FINDINGS: Portable AP semi upright view at 2127 hours. Continued and mildly increased perihilar and basilar predominant interstitial pulmonary opacity. Stable cardiac size and mediastinal contours. No pneumothorax. No pleural effusion or consolidation identified. IMPRESSION: Increased bilateral pulmonary interstitial opacity. Top differential considerations include progression of interstitial edema versus viral/ atypical respiratory infection. No pleural effusion identified. Electronically Signed   By: Genevie Ann M.D.   On: 11/06/2015 21:42   Dg Chest Port 1 View  Result Date: 11/06/2015 CLINICAL DATA:  75 year old male with aspiration of liquid. EXAM: PORTABLE CHEST 1 VIEW COMPARISON:  Chest radiograph dated 11/05/2015 FINDINGS: Mild cardiomegaly with central vascular congestion. Bibasilar Interstitial prominence  and nodular densities, new from prior study may represent atelectatic changes versus infiltrate. There is no pleural effusion or pneumothorax. Top-normal cardiac silhouette. No acute osseous pathology. IMPRESSION: Mild congestive changes with bibasilar subsegmental atelectasis versus infiltrate. Electronically Signed   By: Anner Crete M.D.   On: 11/06/2015 05:16   Dg Chest Port 1 View  Result Date: 11/05/2015 CLINICAL DATA:  Shortness of breath EXAM: PORTABLE CHEST 1 VIEW COMPARISON:  March 14, 2015 FINDINGS: The lungs are clear. Heart is mildly enlarged with pulmonary vascularity within normal limits. No adenopathy. No bone lesions. IMPRESSION: Mild cardiac enlargement.  No edema or consolidation. Electronically Signed   By: Lowella Grip III M.D.   On: 11/05/2015 09:42   Dg Foot Complete Left  Result Date: 10/28/2015 CLINICAL DATA:  Left big toe pain. EXAM: LEFT FOOT - COMPLETE 3+ VIEW COMPARISON:  None. FINDINGS: There is no evidence of fracture or dislocation. No evidence of dystrophic bony changes. There is soft tissue swelling of the distal first digit. Punctate lucencies within the area of soft tissue swelling may represent entrapped fat or potentially foci of gas. Heavy vascular calcifications are seen. IMPRESSION: No evidence of fracture of the left foot. Soft tissue swelling of the distal first digit with punctate lucencies within the area of swelling, which may represent entrapped fat or potentially foci of gas. Please correlate clinically regarding the possibility of cellulitis. Electronically Signed   By: Fidela Salisbury M.D.   On: 10/28/2015 21:01  Dg Fluoro Guide Cv Line-no Report  Result Date: 11/19/2015 CLINICAL DATA:  FLOURO GUIDE CV LINE Fluoroscopy was utilized by the requesting physician.  No radiographic interpretation.      [facility], Mariam Helbert M.D on 11/20/2015 at 1:14 PM [phone]

## 2015-11-20 NOTE — Progress Notes (Signed)
  Postoperative hemodialysis access     Date of Surgery:  11/19/15 Surgeon: Donzetta Matters  Subjective:  Says he's not feeling too good  PHYSICAL EXAMINATION:  Vitals:   11/20/15 1100 11/20/15 1125  BP: 132/61 (!) 162/66  Pulse: 91 94  Resp:  20  Temp:  97.4 F (36.3 C)    Incision is clean and dry There is  Thrill  The graft/fistula is palpable    ASSESSMENT/PLAN:  Mark [name] is a 75 y.o. year old male who is s/p  left BC AVF and insertion right IJ TDC  1 Day Post Op And Angioplasty left anterior tibial artery, atherectomy and drug coated balloon angioplasty, left popliteal artery POD 15 , left 1st great toe amputation POD 14.     -fistula is patent -pt does not have evidence of steal sx -f/u with Dr. Trula Slade in 4 weeks to check maturation of AVF & toe amputation site. -toe amp site is marginal-continue to monitor and continue wet to dry dressing changes   Leontine Locket, PA-C [facility] [phone]

## 2015-11-20 NOTE — Progress Notes (Signed)
ANTICOAGULATION CONSULT NOTE - Follow Up Consult  Pharmacy Consult for Heparin/Coumadin Indication: afib   Allergies  Allergen Reactions  . Tape Other (See Comments)    SKIN WILL TEAR!!    Patient Measurements: Height: 5\' 9"  (175.3 cm) Weight: 207 lb 14.3 oz (94.3 kg) IBW/kg (Calculated) : 70.7  HDWt: 92kg  Vital Signs: Temp: 97.4 F (36.3 C) (08/16 1125) Temp Source: Oral (08/16 1125) BP: 162/66 (08/16 1125) Pulse Rate: 94 (08/16 1125)  Labs:  Recent Labs  11/18/15 0350  11/19/15 0027 11/19/15 2109 11/20/15 0500 11/20/15 0730 11/20/15 1441  HGB 10.8*  --  10.7*  --  10.8*  --   --   HCT 32.8*  --  32.6*  --  33.8*  --   --   PLT 190  --  170  --  166  --   --   LABPROT 21.5*  --  20.6*  --  20.1*  --   --   INR 1.84  --  1.75  --  1.69  --   --   HEPARINUNFRC 0.78*  < > 0.45 0.27* 0.28*  --  0.35  CREATININE 4.05*  --  3.33*  --   --  4.38*  --   < > = values in this interval not displayed.  Estimated Creatinine Clearance: 16.5 mL/min (by C-G formula based on SCr of 4.38 mg/dL).   Assessment: [name] w/ warfarin PTA for AFib. Now s/p arteriogram 8/1 with successful arthrectomy and balloon angioplasty. Heparin restarted following left great toe amputation 8/2 and d/c'd 8/6. Was on warfarin, but then needed to be held for fistula creation. Heparin restarted 8/15 post-op, warfarin to resume 8/16. INR 1.69 this AM. Hg low stable, plt wnl, no bleed documented.  PM heparin level therapeutic at 0.35  Warfarin PTA dosing: 5mg  daily except 2.5mg  on Tuesdays and Thursdays.   Goal of Therapy:  Heparin level 0.3-0.7 units/ml  INR 2-3 Monitor platelets by anticoagulation protocol: Yes    Plan:  Heparin at 1050 units/h Follow up AM labs  Thank you Anette Guarneri, PharmD [phone] -11/20/2015 3:31 PM

## 2015-11-20 NOTE — Procedures (Signed)
I have seen and examined this patient and agree with the plan of care   Patient seen on dialysis   Will need rehabilitation in addition to outpatient dialysis  Nikita Surman W 11/20/2015, 9:57 AM

## 2015-11-20 NOTE — Progress Notes (Signed)
Patient has been assessed multiple times throughout the night about if he needed to void and if he was having any discomfort. Patient denies any discomfort or the need to void. Will continue to monitor.

## 2015-11-21 ENCOUNTER — Encounter: Payer: Medicare Other | Admitting: Vascular Surgery

## 2015-11-21 LAB — PROTIME-INR
INR: 1.59
PROTHROMBIN TIME: 19.1 s — AB (ref 11.4–15.2)

## 2015-11-21 LAB — CBC
HEMATOCRIT: 31.7 % — AB (ref 39.0–52.0)
HEMOGLOBIN: 10.4 g/dL — AB (ref 13.0–17.0)
MCH: 31.9 pg (ref 26.0–34.0)
MCHC: 32.8 g/dL (ref 30.0–36.0)
MCV: 97.2 fL (ref 78.0–100.0)
Platelets: 155 10*3/uL (ref 150–400)
RBC: 3.26 MIL/uL — AB (ref 4.22–5.81)
RDW: 17.5 % — ABNORMAL HIGH (ref 11.5–15.5)
WBC: 7 10*3/uL (ref 4.0–10.5)

## 2015-11-21 LAB — GLUCOSE, CAPILLARY
GLUCOSE-CAPILLARY: 119 mg/dL — AB (ref 65–99)
GLUCOSE-CAPILLARY: 234 mg/dL — AB (ref 65–99)
GLUCOSE-CAPILLARY: 183 mg/dL — AB (ref 65–99)
Glucose-Capillary: 107 mg/dL — ABNORMAL HIGH (ref 65–99)

## 2015-11-21 LAB — HEPARIN LEVEL (UNFRACTIONATED): HEPARIN UNFRACTIONATED: 0.26 [IU]/mL — AB (ref 0.30–0.70)

## 2015-11-21 MED ORDER — WARFARIN SODIUM 5 MG PO TABS
5.0000 mg | ORAL_TABLET | Freq: Once | ORAL | Status: AC
  Administered 2015-11-21: 5 mg via ORAL
  Filled 2015-11-21: qty 1

## 2015-11-21 NOTE — Progress Notes (Signed)
[name]   Subjective:   Interval History:  No complaints awaiting placement , [name] MWF PM  Objective:  Vital signs in last 24 hours:  Temp:  [97.4 F (36.3 C)-98.3 F (36.8 C)] 98.1 F (36.7 C) (08/17 0619) Pulse Rate:  [56-94] 66 (08/17 0619) Resp:  [18-20] 18 (08/17 0619) BP: (120-166)/(47-82) 120/47 (08/17 0619) SpO2:  [95 %-99 %] 99 % (08/17 0619) Weight:  [94.3 kg (207 lb 14.3 oz)] 94.3 kg (207 lb 14.3 oz) (08/16 1125)  Weight change:  Filed Weights   11/18/15 1201 11/20/15 0720 11/20/15 1125  Weight: 96.8 kg (213 lb 6.5 oz) 100.2 kg (220 lb 14.4 oz) 94.3 kg (207 lb 14.3 oz)    Intake/Output: I/O last 3 completed shifts: In: 120 [P.O.:120] Out: 5050 [Urine:600; Other:4450]   Intake/Output this shift:  No intake/output data recorded.  75 y.o. male. 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 Creatine baseline appears to be 1.8-2.0. Primary nephrologist is Dr. Justin Mend. Saw him on 06/26/2015 with BUN 27, creatinine 1.88. Admitted on 10/28/2015 with necrotic left great toe with cellulitis. Creatinine on admission was 2.6. Seen by [name] in consult on 7/25. Creatinine had been improving until last couple days when started to trend back up post aortogram and in setting of being up 13 liters since admission.  Alert oriented  CVS irregular irregular RS  Clear anterior lung fields good air entry AS soft obese non tender EXT s/p left fem pop and toe amputation    Basic Metabolic Panel:  Recent Labs Lab 11/15/15 1313 11/16/15 0415 11/17/15 1203 11/18/15 0350 11/19/15 0027 11/20/15 0730  NA 133* 134* 129* 132* 132* 131*  K 4.1 3.9 3.9 3.8 4.0 3.9  CL 99* 97* 97* 98* 100* 100*  CO2 24 27 26 25 27 24   GLUCOSE 140* 124* 237* 109* 159* 104*  BUN 72* 43* 34* 42* 22* 34*  CREATININE 4.09* 3.40* 3.70*  4.05* 3.33* 4.38*  CALCIUM 8.0* 8.0* 7.8* 8.0* 7.8* 8.3*  PHOS 5.6* 5.3* 4.7* 5.0*  --  5.9*    Liver Function Tests:  Recent Labs Lab 11/15/15 1313 11/16/15 0415 11/17/15 1203 11/18/15 0350 11/20/15 0730  ALBUMIN 2.4* 2.4* 2.5* 2.3* 2.2*   No results for input(s): LIPASE, AMYLASE in the last 168 hours. No results for input(s): AMMONIA in the last 168 hours.  CBC:  Recent Labs Lab 11/16/15 1443 11/18/15 0350 11/19/15 0027 11/20/15 0500 11/21/15 0315  WBC 7.8 9.5 6.9 8.1 7.0  HGB 10.3* 10.8* 10.7* 10.8* 10.4*  HCT 30.4* 32.8* 32.6* 33.8* 31.7*  MCV 95.9 94.0 98.5 96.6 97.2  PLT 246 190 170 166 155    Cardiac Enzymes: No results for input(s): CKTOTAL, CKMB, CKMBINDEX, TROPONINI in the last 168 hours.  BNP: Invalid input(s): POCBNP  CBG:  Recent Labs Lab 11/20/15 0620 11/20/15 1231 11/20/15 1625 11/20/15 2116 11/21/15 0615  GLUCAP 132* 63* 181* 157* 119*    Microbiology: Results for orders placed or performed during the hospital encounter of 10/28/15  Blood Cultures x 2 sites     Status: None   Collection Time: 10/28/15 10:00 PM  Result Value Ref Range Status   Specimen Description BLOOD RIGHT FOREARM  Final   Special Requests BOTTLES DRAWN AEROBIC AND ANAEROBIC 4CC  Final   Culture NO GROWTH 5 DAYS  Final   Report Status 11/02/2015  FINAL  Final  Blood Cultures x 2 sites     Status: None   Collection Time: 10/28/15 11:04 PM  Result Value Ref Range Status   Specimen Description BLOOD LEFT ARM  Final   Special Requests AEROBIC BOTTLE ONLY 7ML  Final   Culture NO GROWTH 5 DAYS  Final   Report Status 11/02/2015 FINAL  Final  C difficile quick scan w PCR reflex     Status: None   Collection Time: 11/04/15  9:42 AM  Result Value Ref Range Status   C Diff antigen NEGATIVE NEGATIVE Final   C Diff toxin NEGATIVE NEGATIVE Final   C Diff interpretation No C. difficile detected.  Final  Surgical PCR screen     Status: Abnormal   Collection Time: 11/05/15  11:18 PM  Result Value Ref Range Status   MRSA, PCR NEGATIVE NEGATIVE Final   Staphylococcus aureus POSITIVE (A) NEGATIVE Final    Comment:        The Xpert SA Assay (FDA approved for NASAL specimens in patients over 18 years of age), is one component of a comprehensive surveillance program.  Test performance has been validated by [facility] for patients greater than or equal to 42 year old. It is not intended to diagnose infection nor to guide or monitor treatment.   Fungus Culture With Stain (Not @ [facility])     Status: None (Preliminary result)   Collection Time: 11/06/15  2:33 PM  Result Value Ref Range Status   Fungus Stain Final report  Final    Comment: (NOTE) Performed At: [facility] [place], [place] [id number] Lindon Romp MD [id number]    Fungus (Mycology) Culture PENDING  Incomplete   Fungal Source WOUND  Final    Comment: LEFT TOE POF VANC AND ZOSYN   Acid Fast Smear (AFB)     Status: None   Collection Time: 11/06/15  2:33 PM  Result Value Ref Range Status   AFB Specimen Processing Concentration  Final   Acid Fast Smear Negative  Final    Comment: (NOTE) Performed At: [facility] [place], [place] [id number] Lindon Romp MD [id number]    Source (AFB) WOUND  Final    Comment: LEFT TOE POF VANC AND ZOSYN   Aerobic Culture (superficial specimen)     Status: None   Collection Time: 11/06/15  2:33 PM  Result Value Ref Range Status   Specimen Description WOUND LEFT TOE  Final   Special Requests GREAT TOE POF VANC AND ZOSYN  Final   Gram Stain   Final    NO WBC SEEN FEW SQUAMOUS EPITHELIAL CELLS PRESENT FEW GRAM POSITIVE COCCI IN CLUSTERS    Culture MULTIPLE ORGANISMS PRESENT, NONE PREDOMINANT  Final   Report Status 11/08/2015 FINAL  Final  Fungus Culture Result     Status: None   Collection Time: 11/06/15  2:33 PM  Result Value Ref Range Status   Result 1 Comment  Final    Comment:  (NOTE) KOH/Calcofluor preparation:  no fungus observed. Performed At: [facility] [place], [place] [id number] Lindon Romp MD [id number]   C difficile quick scan w PCR reflex     Status: None   Collection Time: 11/09/15  4:08 PM  Result Value Ref Range Status   C Diff antigen NEGATIVE NEGATIVE Final   C Diff toxin NEGATIVE NEGATIVE Final   C Diff interpretation No C. difficile detected.  Final  Surgical pcr  screen     Status: Abnormal   Collection Time: 11/19/15  5:44 AM  Result Value Ref Range Status   MRSA, PCR NEGATIVE NEGATIVE Final   Staphylococcus aureus POSITIVE (A) NEGATIVE Final    Comment:        The Xpert SA Assay (FDA approved for NASAL specimens in patients over 30 years of age), is one component of a comprehensive surveillance program.  Test performance has been validated by [facility] for patients greater than or equal to 75 year old. It is not intended to diagnose infection nor to guide or monitor treatment.     Coagulation Studies:  Recent Labs  11/19/15 0027 11/20/15 0500 11/21/15 0315  LABPROT 20.6* 20.1* 19.1*  INR 1.75 1.69 1.59    Urinalysis: No results for input(s): COLORURINE, LABSPEC, PHURINE, GLUCOSEU, HGBUR, BILIRUBINUR, KETONESUR, PROTEINUR, UROBILINOGEN, NITRITE, LEUKOCYTESUR in the last 72 hours.  Invalid input(s): APPERANCEUR    Imaging: Dg Chest Port 1 View  Result Date: 11/19/2015 CLINICAL DATA:  As post dialysis catheter placement EXAM: PORTABLE CHEST 1 VIEW COMPARISON:  11/09/2015 FINDINGS: Previously seen temper a catheter is been removed in a new right jugular dialysis catheter placed in satisfactory position. Cardiac shadow is within normal limits. Vascular congestion is noted likely related to volume overload. No sizable effusion is seen. IMPRESSION: Vascular congestion likely related to volume overload. No pneumothorax is noted. Electronically Signed   By: Inez Catalina M.D.   On: 11/19/2015  10:03   Dg Fluoro Guide Cv Line-no Report  Result Date: 11/19/2015 CLINICAL DATA:  FLOURO GUIDE CV LINE Fluoroscopy was utilized by the requesting physician.  No radiographic interpretation.     Medications:   . heparin 1,150 Units/hr (11/21/15 0640)   . allopurinol  150 mg Oral Daily  . aspirin  81 mg Oral Daily  . calcitRIOL  0.5 mcg Oral Daily  . docusate sodium  100 mg Oral Daily  . famotidine  20 mg Oral QHS  . feeding supplement  1 Container Oral TID WC  . hydrALAZINE  25 mg Oral Q8H  . insulin aspart  0-15 Units Subcutaneous TID WC  . insulin aspart  5 Units Subcutaneous TID WC  . insulin glargine  20 Units Subcutaneous QHS  . levothyroxine  137 mcg Oral QAC breakfast  . magnesium oxide  400 mg Oral BID  . metoprolol succinate  100 mg Oral Daily  . pantoprazole  40 mg Oral BID  . predniSONE  5 mg Oral Daily  . rosuvastatin  10 mg Oral QPM  . saccharomyces boulardii  250 mg Oral BID  . tacrolimus  1 mg Oral BID  . tamsulosin  0.4 mg Oral QPC supper  . warfarin  5 mg Oral ONCE-1800  . Warfarin - Pharmacist Dosing Inpatient   Does not apply q1800   alum & mag hydroxide-simeth, diphenoxylate-atropine, guaiFENesin-dextromethorphan, hydrALAZINE, HYDROcodone-acetaminophen, labetalol, levalbuterol, metoprolol, ondansetron (ZOFRAN) IV, oxyCODONE, phenol  Assessment/ Plan:  Renal transplant (2005)  with AKI on CKD4 post contrast administration - Baseline creatinine 1.88-2 Timing of increased creatinine coincides with aortogram on August 1, however, atheroembolic event remains possible as well given his stuttering creatinine increases.  It appears he received 60cc of contrast during procedure which is a moderate contrast load given his GFR - HD tomorrow ---- Tia Alert  As outpatient - support with dialysis for now - can do a 3 month trial for AKI, hope for recovery and continue all anti-rejection meds, then assume ESRD if no improvement -  Placed Access 8/15   CKD-MBD  On  calcitriol 0.77mcg daily, phosphorous 250mg  BID PTH on 7/25 was 82  HTN/Volume:  BP 120/70 controlled  Volume removal with HD will continue   DM Per primary service  AFib:  BB for rate control, on heparin, coumadin per VVS  Gangrene/Cellulitis/Toe Amputation:  S/p atherectomy and DCB L popliteal art, and PTA left anterior tibial artery 11/05/2015.  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  VVS following.  HLD  Hypothyroidism   LOS: 24 [name] W @TODAY @7 :59 AM

## 2015-11-21 NOTE — Progress Notes (Signed)
Physical Therapy Treatment Patient Details Name: Mark [name] MRN: [id number] DOB: 04-Apr-1941 Today's Date: 11/21/2015    History of Present Illness 75 y.o. male s/p L great toe amputation. 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 Pt currently with functional limitations due to balance and endurance deficits. Pt was able to walk with RW a short distance but still fatigues quickly.  Pain limits pt as well. Will continue to progress pt as able. Pt will benefit from skilled PT to increase their independence and safety with mobility to allow discharge to the venue listed below.    Follow Up Recommendations  SNF     Equipment Recommendations  Rolling walker with 5" wheels    Recommendations for Other Services       Precautions / Restrictions Precautions Precautions: Fall Precaution Comments: History of peripheral neuropathy. Pt states he can't really feel his feet. Required Braces or Orthoses: Other Brace/Splint Other Brace/Splint: Darco shoe Restrictions Weight Bearing Restrictions: Yes RLE Weight Bearing: Weight bearing as tolerated LLE Weight Bearing: Partial weight bearing LLE Partial Weight Bearing Percentage or Pounds: through heel only with Darco shoe donned    Mobility  Bed Mobility Overal bed mobility: Needs Assistance Bed Mobility: Supine to Sit     Supine to sit: Min assist     General bed mobility comments: Assist for trunk support and for line management. VCs for hand placement.  Transfers Overall transfer level: Needs assistance Equipment used: Rolling walker (2 wheeled) Transfers: Sit to/from Stand Sit to Stand: Mod assist;+2 safety/equipment         General transfer comment: +2 assist for boost to stand and to safely stabilize balance prior to ambulation. +2 assist also necessary for chair follow as pt fatigues quickly and suddenly  becomes dizzy. VCs for safe hand placement and to stand tall.  Ambulation/Gait Ambulation/Gait assistance: Min assist;Mod assist;+2 physical assistance Ambulation Distance (Feet): 6 Feet Assistive device: Rolling walker (2 wheeled) Gait Pattern/deviations: Step-through pattern;Decreased stride length;Antalgic;Trunk flexed;Wide base of support Gait velocity: decreased Gait velocity interpretation: Below normal speed for age/gender General Gait Details: Slow, mildly unsteady gait. DOE. Pt was able to ambulate with cues to stay close to RW as he tends to push it out too far.  Poor endurance with pt only able to go short distances.  Pt with flexing posture due to fatigue.  Seated rest break x 1.  Second attempt, had to place sacral dressing on after wiping bottom and cream applied as pt has some bleeding on buttocks.  Nursing aware.  Pt tolerated standing to take care of buttocks second attempt but was too fatigued to walk.     Stairs            Wheelchair Mobility    Modified Rankin (Stroke Patients Only)       Balance Overall balance assessment: Needs assistance Sitting-balance support: No upper extremity supported;Feet supported Sitting balance-Leahy Scale: Good     Standing balance support: Bilateral upper extremity supported;During functional activity Standing balance-Leahy Scale: Poor Standing balance comment: Heavy reliance on RW for balance.                      Cognition Arousal/Alertness: Awake/alert Behavior During Therapy: WFL for tasks assessed/performed Overall Cognitive Status: Within Functional Limits for tasks assessed Area of Impairment: Safety/judgement  Safety/Judgement: Decreased awareness of safety          Exercises General Exercises - Lower Extremity Ankle Circles/Pumps: AROM;Both;10 reps Quad Sets: AROM;Both;10 reps Gluteal Sets: AROM;Both;10 reps Heel Slides: AAROM;Both;10 reps    General Comments General comments (skin  integrity, edema, etc.): Nurse aware that buttocks with some breakdown.  Also text paged MD regarding possibly ordering air mattress overlay       Pertinent Vitals/Pain Pain Assessment: Faces Faces Pain Scale: Hurts little more Pain Location: left foot and catheter Pain Descriptors / Indicators: Aching;Discomfort;Grimacing Pain Intervention(s): Limited activity within patient's tolerance;Monitored during session;Repositioned (requested RN check catheter)  VSS    Home Living                      Prior Function            PT Goals (current goals can now be found in the care plan section) Acute Rehab PT Goals Patient Stated Goal: get home Progress towards PT goals: Progressing toward goals    Frequency  Min 2X/week    PT Plan Current plan remains appropriate    Co-evaluation PT/OT/SLP Co-Evaluation/Treatment: Yes Reason for Co-Treatment: For patient/therapist safety PT goals addressed during session: Mobility/safety with mobility OT goals addressed during session: ADL's and self-care;Strengthening/ROM;Proper use of Adaptive equipment and DME     End of Session Equipment Utilized During Treatment: Gait belt;Oxygen Activity Tolerance: Patient limited by fatigue;Patient limited by pain Patient left: in chair;with call bell/phone within reach;with chair alarm set;with nursing/sitter in room     Time: 1006-1029 PT Time Calculation (min) (ACUTE ONLY): 23 min  Charges:  $Gait Training: 8-22 mins $Therapeutic Exercise: 8-22 mins                    G Codes:      Alondra Sahni, Arrie Aran F 02-Dec-2015, 1:14 PM  [employer] Acute [facility] [phone] (pager)

## 2015-11-21 NOTE — Progress Notes (Signed)
[facility]  Subjective  - Doing well over all.   Objective (!) 120/47 66 98.1 F (36.7 C) (Oral) 18 99%  Intake/Output Summary (Last 24 hours) at 11/21/15 0747 Last data filed at 11/21/15 [id number]  Gross per 24 hour  Intake              120 ml  Output             5050 ml  Net            -4930 ml    Left toe amputation site dry wound bed, no active drainage, no malodor Wet to dry changed Heart RRR  Assessment/Planning: left BC AVF and insertion right IJ TDC  2 Day Post Op And Angioplasty left anterior tibial artery, atherectomy and drug coated balloon angioplasty, left popliteal artery POD 16 , left 1st great toe amputation POD 14.   Cont current treatment -f/u with Dr. Trula Slade in 4 weeks to check maturation of AVF & toe amputation site. Laurence Slate [facility] 11/21/2015 7:47 AM --  Laboratory Lab Results:  Recent Labs  11/20/15 0500 11/21/15 0315  WBC 8.1 7.0  HGB 10.8* 10.4*  HCT 33.8* 31.7*  PLT 166 155   BMET  Recent Labs  11/19/15 0027 11/20/15 0730  NA 132* 131*  K 4.0 3.9  CL 100* 100*  CO2 27 24  GLUCOSE 159* 104*  BUN 22* 34*  CREATININE 3.33* 4.38*  CALCIUM 7.8* 8.3*    COAG Lab Results  Component Value Date   INR 1.59 11/21/2015   INR 1.69 11/20/2015   INR 1.75 11/19/2015   No results found for: PTT

## 2015-11-21 NOTE — Consult Note (Signed)
   [facility] CM Inpatient Consult   11/21/2015  Mark [name] 03-Jan-1941 [id number]    Patient screened for potential [facility] Care Management services. Chart reviewed. Noted discharge plan is for  SNF.  There are no identifiable [facility] Care Management needs at this time. Confirmed with inpatient RNCM. If patient's post hospital needs change, please place a [facility] Care Management consult. For questions please contact:  Marthenia Rolling, [place], RN,BSN [facility] Liaison [phone]

## 2015-11-21 NOTE — Progress Notes (Signed)
Patient ID: Mark [name], male   DOB: 05/19/1940, 75 y.o.   MRN: [id number]                                                                PROGRESS NOTE                                                                                                                                                                                                             Patient Demographics:    Lou Sandborn, is a 75 y.o. male, DOB - 07-Dec-1940, [id number]  Admit date - 10/28/2015   Admitting Physician Etta Quill, DO  Outpatient Primary MD for the patient is Rochel Brome, MD  LOS - 24  Outpatient Specialists:   Chief Complaint  Patient presents with  . 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 Patient presents to ED with c/o 'his toe is dead'. Creatinine baseline appears to be 1.8-2.3.  Primary nephrologist is Dr. Justin Mend.  Saw him on 06/26/2015 with BUN 27, creatinine 1.88.  Admitted on 10/28/2015 with necrotic left great toe with cellulitis.  Creatinine on admission was 2.6.  Seen by [name] in consult on 7/25.  His Prograf level was found to be low and was increased.  Patient underwent abdominal aortogram with angioplasty of the left anterior tibial artery and left popliteal artery on 11/05/2015.  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  He was on Vancomycin and Zosyn from 7/24-8/3.  Currently only on Zosyn.  He developed some dyspnea in the setting of being positive about 13 liters since admission with evidence of volume overload on exam. Worsening renal failure. Placement of temporary hemodialysis catheter on 8/10. Now started on HD. Will follow clinical response and renal function recovery.   Subjective:    [name]  Afebrile.Denies any nausea or vomiting, complaints of lower back pain, has urinary retention, unsuccessful in and out by  staff  Assessment  & Plan :    Principal Problem:   Gangrene of toe ([place]) Active Problems:   Type II diabetes mellitus, uncontrolled ([place])   Renal transplant recipient   Diabetic ulcer of left great  toe ([facility])   Cellulitis of great toe of left foot   Aspiration of liquid   SOB (shortness of breath)   Permanent atrial fibrillation ([facility])   Bibasilar crackles   CHF (congestive heart failure) ([facility])   Melena   Renal failure   Acute on chronic diastolic heart failure -Unable to diurese with the help of diuretics; renal function worsen and has required HD now. -cardiology and nephrology were managing diuretics/fluid overload status with HD now -2-D echo did not show any wall motion abnormalities and the patient does not appear to have any ischemic changes on his EKG -Renal allograft function worse today with Creatinine 3.7 -Per nephrology rec's will continue HD for now  Gangrene of left  Toe/Toe Amputation: -S /P atherectomy and DCB Left popliteal artery and PTA left ATA (POD 5) -S/P Ray amputation of left great toe (POD 4) -Palpable Left popliteal pulse; wound is healing properly -vascular surgery on board. -patient has completed antibiotic therapy  Urinary retention  - Bladder scan showing more than 600 mL , staff unable to do in and out , urology consulted . Foley catheter inserted on 8/16. - started on flomax  Anemia of chronic diseases,  -FOBT positive, but no signs of acute bleeding appreciated -with ongoing hemodilution from anasarca/fluid overload, Hgb has remained stable around 10.5 for several days.  -Continue PPI twice a day -follow Hgb trend   Diarrhea - C. difficile negative 2 during this admission - will continue probiotic - PRN lomotil ordered  - No fever, no nausea, no vomiting, no abdominal pain. - symptoms improved according to patient   Acute on Chronic kidney disease stage IV, status post renal transplant , baseline creatinine around 1.7, now up to 4.9  and with oliguria -thought to be acute process caused by contrast  -will continue tacrolimus -anticipation of potential permanent dialysis catheter and AVF access later today (8/15) -renal service on board, will follow rec's -patient remained oliguric and HD dependent currently.  A fib with aberrancy -  -continue b-blocker for rate controlled -on coumadin for anticoagulation (dose per pharmacy); on hold for placement of permanent HD catheter and AVF (procedures plan for later today 11/19/15). Vascular surgery to clear for resumption on coumadin. -cardiology on board, will follow rec's -CHADsVASC score 4  DM2 -  uncontrolled -Hemoglobin A1c 8.2 -will continue SSI, meal coverage and lantus  Hypertension:  -Continue norvasc and metoprolol -Prn hydralazine also ordered - On the lower side today, will continue to monitor closely  Hypothyroidism - continue Synthroid  Protein Calorie Malnutrition -will continue Prostat -last albumin 2.1 -nutritional service consulted, will follow rec's  Dyslipidemia-continue Crestor  Pulmonary hypertension:  Mr. Cada has moderate pulmonary hypertension.    -Cardiology Recommending outpatient sleep study and PFTs.   -He does have grade 2 diastolic dysfunction and will benefit of volume control -currently needing HD -will follow cardiology and renal service rec's  Code Status : FULL CODE  Family Communication  : Friend at bedside, discussed with him per patient request  Disposition Plan  :  ? Will likely need SNF at discharge; will follow PT evaluation.  Barriers For Discharge : worsening renal function, and the patient on dialysis now  Consults  :  Vascular surgery, nephrology, cardiology  Procedures  : Atherectomy and drug coated balloon angioplasty 8/1 , left ray amputation 8/2,  Right IJ tunneled dialysis catheter placement with US guidance,  leftbrachiocephalic arteriovenous fistula placement on 8/15  DVT Prophylaxis  :   Heparin   >>  warfarin  Lab Results  Component Value Date   PLT 155 11/21/2015    Antibiotics  :     10/28/15 2200  piperacillin-tazobactam (ZOSYN) IVPB 3.375 g  Status:  Discontinued     3.375 g 12.5 mL/hr over 240 Minutes Intravenous Every 8 hours 10/28/15 2120 11/09/15 1057   10/28/15 2200  vancomycin (VANCOCIN) 2,500 mg in sodium chloride 0.9 % 500 mL IVPB     2,500 mg 250 mL/hr over 120 Minutes Intravenous  Once 10/28/15 2154 10/29/15 2330        Objective:   Vitals:   11/20/15 2031 11/21/15 0619 11/21/15 0953 11/21/15 1348  BP:  (!) 120/47 (!) 120/48 (!) 100/44  Pulse:  66 96 86  Resp: 20 18    Temp: 98.3 F (36.8 C) 98.1 F (36.7 C)  98.1 F (36.7 C)  TempSrc: Oral Oral    SpO2: 98% 99%  100%  Weight:      Height:        Wt Readings from Last 3 Encounters:  11/20/15 94.3 kg (207 lb 14.3 oz)  09/05/15 103.1 kg (227 lb 3.2 oz)  08/05/15 101.8 kg (224 lb 6.4 oz)     Intake/Output Summary (Last 24 hours) at 11/21/15 1503 Last data filed at 11/21/15 0906  Gross per 24 hour  Intake              360 ml  Output              600 ml  Net             -240 ml     Physical Exam General: AAOX3, in no acute distress. Head/eyes: [place]/AT,PERRLA, no icterus Neck:Supple Neck,  Lungs: Symmetrical Chest wall movement, Good air movement bilaterally, decrease breath sounds at bases, no wheezing  Heart: rate controlled. Positive A. Fib on telemetry, no rubs or gallops Abdomen: +ve B.Sounds, Abd Soft,Suprapubic tenderness,  No rebound - guarding or rigidity. Extremities: Diffuse anasarca, 2-3+ pitting edema throughout; left ray amputation, with clean dressing and just minimal serosanguineous drainage.   Data Review:    CBC  Recent Labs Lab 11/16/15 1443 11/18/15 0350 11/19/15 0027 11/20/15 0500 11/21/15 0315  WBC 7.8 9.5 6.9 8.1 7.0  HGB 10.3* 10.8* 10.7* 10.8* 10.4*  HCT 30.4* 32.8* 32.6* 33.8* 31.7*  PLT 246 190 170 166 155  MCV 95.9 94.0 98.5 96.6 97.2  MCH  32.5 30.9 32.3 30.9 31.9  MCHC 33.9 32.9 32.8 32.0 32.8  RDW 16.9* 17.2* 17.4* 17.6* 17.5*    Chemistries   Recent Labs Lab 11/16/15 0415 11/17/15 1203 11/18/15 0350 11/19/15 0027 11/20/15 0730  NA 134* 129* 132* 132* 131*  K 3.9 3.9 3.8 4.0 3.9  CL 97* 97* 98* 100* 100*  CO2 27 26 25 27 24   GLUCOSE 124* 237* 109* 159* 104*  BUN 43* 34* 42* 22* 34*  CREATININE 3.40* 3.70* 4.05* 3.33* 4.38*  CALCIUM 8.0* 7.8* 8.0* 7.8* 8.3*   ------------------------------------------------------------------------------------------------------------------ No results for input(s): CHOL, HDL, LDLCALC, TRIG, CHOLHDL, LDLDIRECT in the last 72 hours.  Lab Results  Component Value Date   HGBA1C 8.2 (H) 10/29/2015   ------------------------------------------------------------------------------------------------------------------ No results for input(s): TSH, T4TOTAL, T3FREE, THYROIDAB in the last 72 hours.  Invalid input(s): FREET3 ------------------------------------------------------------------------------------------------------------------ No results for input(s): VITAMINB12, FOLATE, FERRITIN, TIBC, IRON, RETICCTPCT in the last 72 hours.  Coagulation profile  Recent Labs Lab 11/17/15 1203 11/18/15 0350 11/19/15 0027 11/20/15 0500 11/21/15 0315  INR 1.76 1.84 1.75 1.69 1.59  Cardiac Enzymes No results for input(s): CKMB, TROPONINI, MYOGLOBIN in the last 168 hours.  Invalid input(s): CK ------------------------------------------------------------------------------------------------------------------ No results found for: BNP  Inpatient Medications  Scheduled Meds: . allopurinol  150 mg Oral Daily  . aspirin  81 mg Oral Daily  . calcitRIOL  0.5 mcg Oral Daily  . docusate sodium  100 mg Oral Daily  . famotidine  20 mg Oral QHS  . feeding supplement  1 Container Oral TID WC  . hydrALAZINE  25 mg Oral Q8H  . insulin aspart  0-15 Units Subcutaneous TID WC  . insulin aspart   5 Units Subcutaneous TID WC  . insulin glargine  20 Units Subcutaneous QHS  . levothyroxine  137 mcg Oral QAC breakfast  . magnesium oxide  400 mg Oral BID  . metoprolol succinate  100 mg Oral Daily  . pantoprazole  40 mg Oral BID  . predniSONE  5 mg Oral Daily  . rosuvastatin  10 mg Oral QPM  . saccharomyces boulardii  250 mg Oral BID  . tacrolimus  1 mg Oral BID  . tamsulosin  0.4 mg Oral QPC supper  . warfarin  5 mg Oral ONCE-1800  . Warfarin - Pharmacist Dosing Inpatient   Does not apply q1800   Continuous Infusions: . heparin 1,150 Units/hr (11/21/15 0640)   PRN Meds:.alum & mag hydroxide-simeth, diphenoxylate-atropine, guaiFENesin-dextromethorphan, hydrALAZINE, HYDROcodone-acetaminophen, labetalol, levalbuterol, metoprolol, ondansetron (ZOFRAN) IV, oxyCODONE, phenol  Micro Results Recent Results (from the past 240 hour(s))  Surgical pcr screen     Status: Abnormal   Collection Time: 11/19/15  5:44 AM  Result Value Ref Range Status   MRSA, PCR NEGATIVE NEGATIVE Final   Staphylococcus aureus POSITIVE (A) NEGATIVE Final    Comment:        The Xpert SA Assay (FDA approved for NASAL specimens in patients over 74 years of age), is one component of a comprehensive surveillance program.  Test performance has been validated by [facility] for patients greater than or equal to 65 year old. It is not intended to diagnose infection nor to guide or monitor treatment.     Radiology Reports Ir Fluoro Guide Cv Line Right  Result Date: 11/14/2015 INDICATION: Acute kidney injury and needs hemodialysis. Plan for placement of non tunneled catheter due to elevated INR level. EXAM: FLUOROSCOPIC AND ULTRASOUND GUIDED PLACEMENT OF A NON TUNNELED DIALYSIS CATHETER Physician: Stephan Minister. Henn, MD MEDICATIONS: None ANESTHESIA/SEDATION: None FLUOROSCOPY TIME:  Fluoroscopy Time: 24 seconds COMPLICATIONS: None immediate. PROCEDURE: Informed consent was obtained for placement of a non tunneled  dialysis catheter. The patient was placed supine on the interventional table. Ultrasound confirmed a patent right internal jugular vein. Ultrasound images were obtained for documentation. The right side of the neck was prepped and draped in a sterile fashion. The right neck was anesthetized with 1% lidocaine. Maximal barrier sterile technique was utilized including caps, mask, sterile gowns, sterile gloves, sterile drape, hand hygiene and skin antiseptic. A small incision was made with #11 blade scalpel. A 21 gauge needle directed into the right internal jugular vein with ultrasound guidance. A micropuncture dilator set was placed. 20 cm Mahurkar catheter was selected. J wire was advanced into the IVC. Tract was dilated to accommodate the catheter. Catheter tip placed near the superior cavoatrial junction. All the lumens aspirated and flushed well. Appropriate amount of heparin was placed in the dialysis lumens. Catheter was sutured to the skin with Prolene suture. Fluoroscopic and ultrasound images were taken and saved for documentation. FINDINGS:  Patent right internal jugular vein. Catheter tip near the superior cavoatrial junction. IMPRESSION: Successful placement of a non tunneled dialysis catheter using ultrasound and fluoroscopic guidance. Electronically Signed   By: Markus Daft M.D.   On: 11/14/2015 19:52   Ir US Guide Vasc Access Right  Result Date: 11/14/2015 INDICATION: Acute kidney injury and needs hemodialysis. Plan for placement of non tunneled catheter due to elevated INR level. EXAM: FLUOROSCOPIC AND ULTRASOUND GUIDED PLACEMENT OF A NON TUNNELED DIALYSIS CATHETER Physician: Stephan Minister. Henn, MD MEDICATIONS: None ANESTHESIA/SEDATION: None FLUOROSCOPY TIME:  Fluoroscopy Time: 24 seconds COMPLICATIONS: None immediate. PROCEDURE: Informed consent was obtained for placement of a non tunneled dialysis catheter. The patient was placed supine on the interventional table. Ultrasound confirmed a patent right  internal jugular vein. Ultrasound images were obtained for documentation. The right side of the neck was prepped and draped in a sterile fashion. The right neck was anesthetized with 1% lidocaine. Maximal barrier sterile technique was utilized including caps, mask, sterile gowns, sterile gloves, sterile drape, hand hygiene and skin antiseptic. A small incision was made with #11 blade scalpel. A 21 gauge needle directed into the right internal jugular vein with ultrasound guidance. A micropuncture dilator set was placed. 20 cm Mahurkar catheter was selected. J wire was advanced into the IVC. Tract was dilated to accommodate the catheter. Catheter tip placed near the superior cavoatrial junction. All the lumens aspirated and flushed well. Appropriate amount of heparin was placed in the dialysis lumens. Catheter was sutured to the skin with Prolene suture. Fluoroscopic and ultrasound images were taken and saved for documentation. FINDINGS: Patent right internal jugular vein. Catheter tip near the superior cavoatrial junction. IMPRESSION: Successful placement of a non tunneled dialysis catheter using ultrasound and fluoroscopic guidance. Electronically Signed   By: Markus Daft M.D.   On: 11/14/2015 19:52   Dg Chest Port 1 View  Result Date: 11/19/2015 CLINICAL DATA:  As post dialysis catheter placement EXAM: PORTABLE CHEST 1 VIEW COMPARISON:  11/09/2015 FINDINGS: Previously seen temper a catheter is been removed in a new right jugular dialysis catheter placed in satisfactory position. Cardiac shadow is within normal limits. Vascular congestion is noted likely related to volume overload. No sizable effusion is seen. IMPRESSION: Vascular congestion likely related to volume overload. No pneumothorax is noted. Electronically Signed   By: Inez Catalina M.D.   On: 11/19/2015 10:03   Dg Chest Port 1 View  Result Date: 11/09/2015 CLINICAL DATA:  Shortness of breath EXAM: PORTABLE CHEST 1 VIEW COMPARISON:  November 06, 2015  FINDINGS: Mild cardiomegaly. The hila and mediastinum are unchanged. Diffuse bilateral interstitial opacities again identified and similar in the interval. The opacity is a little more focal in the left suprahilar region which is increased since November 05, 2015 but unchanged since November 06, 2015. No other acute interval changes. There may be tiny bilateral effusions with blunting of the costophrenic angles. IMPRESSION: 1. Suspected tiny bilateral pleural effusions and diffuse increased interstitial markings are most consistent with pulmonary edema. Atypical viral infection could have a similar appearance. Recommend clinical correlation. *Opacity in the left suprahilar region is a little more focal when compared November 05, 2015. The difference could be due to confluence of shadows. Recommend follow-up to resolution. Electronically Signed   By: Dorise Bullion III M.D   On: 11/09/2015 09:14   Dg Chest Port 1 View  Result Date: 11/06/2015 CLINICAL DATA:  75 year old male with increasing shortness of breath and abnormal left side pulmonary auscultation. Initial encounter.  EXAM: PORTABLE CHEST 1 VIEW COMPARISON:  0508 hours today and earlier. FINDINGS: Portable AP semi upright view at 2127 hours. Continued and mildly increased perihilar and basilar predominant interstitial pulmonary opacity. Stable cardiac size and mediastinal contours. No pneumothorax. No pleural effusion or consolidation identified. IMPRESSION: Increased bilateral pulmonary interstitial opacity. Top differential considerations include progression of interstitial edema versus viral/ atypical respiratory infection. No pleural effusion identified. Electronically Signed   By: Genevie Ann M.D.   On: 11/06/2015 21:42   Dg Chest Port 1 View  Result Date: 11/06/2015 CLINICAL DATA:  75 year old male with aspiration of liquid. EXAM: PORTABLE CHEST 1 VIEW COMPARISON:  Chest radiograph dated 11/05/2015 FINDINGS: Mild cardiomegaly with central vascular congestion.  Bibasilar Interstitial prominence and nodular densities, new from prior study may represent atelectatic changes versus infiltrate. There is no pleural effusion or pneumothorax. Top-normal cardiac silhouette. No acute osseous pathology. IMPRESSION: Mild congestive changes with bibasilar subsegmental atelectasis versus infiltrate. Electronically Signed   By: Anner Crete M.D.   On: 11/06/2015 05:16   Dg Chest Port 1 View  Result Date: 11/05/2015 CLINICAL DATA:  Shortness of breath EXAM: PORTABLE CHEST 1 VIEW COMPARISON:  March 14, 2015 FINDINGS: The lungs are clear. Heart is mildly enlarged with pulmonary vascularity within normal limits. No adenopathy. No bone lesions. IMPRESSION: Mild cardiac enlargement.  No edema or consolidation. Electronically Signed   By: Lowella Grip III M.D.   On: 11/05/2015 09:42   Dg Foot Complete Left  Result Date: 10/28/2015 CLINICAL DATA:  Left big toe pain. EXAM: LEFT FOOT - COMPLETE 3+ VIEW COMPARISON:  None. FINDINGS: There is no evidence of fracture or dislocation. No evidence of dystrophic bony changes. There is soft tissue swelling of the distal first digit. Punctate lucencies within the area of soft tissue swelling may represent entrapped fat or potentially foci of gas. Heavy vascular calcifications are seen. IMPRESSION: No evidence of fracture of the left foot. Soft tissue swelling of the distal first digit with punctate lucencies within the area of swelling, which may represent entrapped fat or potentially foci of gas. Please correlate clinically regarding the possibility of cellulitis. Electronically Signed   By: Fidela Salisbury M.D.   On: 10/28/2015 21:01  Dg Fluoro Guide Cv Line-no Report  Result Date: 11/19/2015 CLINICAL DATA:  FLOURO GUIDE CV LINE Fluoroscopy was utilized by the requesting physician.  No radiographic interpretation.      Waldron Labs, DAWOOD M.D on 11/21/2015 at 3:03 PM [phone]

## 2015-11-21 NOTE — Progress Notes (Signed)
[facility] for Heparin/Coumadin Indication: afib   Allergies  Allergen Reactions  . Tape Other (See Comments)    SKIN WILL TEAR!!    Patient Measurements: Height: 5\' 9"  (175.3 cm) Weight: 207 lb 14.3 oz (94.3 kg) IBW/kg (Calculated) : 70.7  HDWt: 92kg  Vital Signs: Temp: 98.3 F (36.8 C) (08/16 2031) Temp Source: Oral (08/16 2031) BP: 162/82 (08/16 2031) Pulse Rate: 88 (08/16 2031)  Labs:  Recent Labs  11/19/15 0027  11/20/15 0500 11/20/15 0730 11/20/15 1441 11/21/15 0315  HGB 10.7*  --  10.8*  --   --  10.4*  HCT 32.6*  --  33.8*  --   --  31.7*  PLT 170  --  166  --   --  155  LABPROT 20.6*  --  20.1*  --   --  19.1*  INR 1.75  --  1.69  --   --  1.59  HEPARINUNFRC 0.45  < > 0.28*  --  0.35 0.26*  CREATININE 3.33*  --   --  4.38*  --   --   < > = values in this interval not displayed.  Estimated Creatinine Clearance: 16.5 mL/min (by C-G formula based on SCr of 4.38 mg/dL).   Assessment: 75 yo male with Afib for anticoagulation  Goal of Therapy:  Heparin level 0.3-0.7 units/ml  INR 2-3 Monitor platelets by anticoagulation protocol: Yes    Plan:  Increase Heparin 1150 units/hr Coumadin 5 mg today  Phillis Knack, PharmD, BCPS  -11/21/2015 4:58 AM

## 2015-11-21 NOTE — Care Management Note (Signed)
Case Management Note Previous CM note initiated by Jonnie Finner  Patient Details  Name: Mark [name] MRN: [id number] Date of Birth: 18-Nov-1940  Subjective/Objective:   Amputation of left first toe with resection of metatarsal head 8/2                 Action/Plan: Discharge Planning:  11/21/15- d/c plan is for SNF- CSW following for placement needs- pt has now started on HD on 8/10 - clipping process has been started for outpt HD location- pt will need to tolerate HD in chair prior to discharge.   Chart reviewed. Waiting final recommendations SNF vs HH. Pt preoperatively arranged with [facility] for HH.     Expected Discharge Date:                  Expected Discharge Plan:  Skilled Nursing Facility  In-House Referral:  Clinical Social Work  Discharge planning Services  CM Consult  Post Acute Care Choice:  Home Health Choice offered to:     DME Arranged:    DME Agency:     HH Arranged:    [place] Agency:  Other - See comment, [address]  Status of Service:  In process, will continue to follow  If discussed at Long Length of Stay Meetings, dates discussed:    Additional Comments:    Dawayne Patricia, RN 11/21/2015, 10:44 AM [phone]

## 2015-11-21 NOTE — Progress Notes (Signed)
Occupational Therapy Treatment Patient Details Name: Mark [name] MRN: [id number] DOB: 10-Mar-1941 Today's Date: 11/21/2015    History of present illness 75 y.o. male s/p L great toe amputation. PMH significant HTN, afib on Coumadin, ESRD s/p kidney tranplant 12 years ago, and DM type II.   OT comments  Pt continues to make slow progress towards OT goals as he continues to be limited by fatigue and dizziness. Pt required mod +2 assist for safety for basic transfers and ambulation. Pt continues to require max assist for LB ADLs in seated or standing position due to weakness and balance deficits. Continue with current POC and will continue to follow acutely.  Follow Up Recommendations  SNF;Supervision/Assistance - 24 hour    Equipment Recommendations  Other (comment) (TBD at next venue)    Recommendations for Other Services      Precautions / Restrictions Precautions Precautions: Fall Required Braces or Orthoses: Other Brace/Splint Other Brace/Splint: Darco shoe Restrictions Weight Bearing Restrictions: Yes RLE Weight Bearing: Weight bearing as tolerated LLE Weight Bearing: Partial weight bearing LLE Partial Weight Bearing Percentage or Pounds: through heel only with Darco shoe donned       Mobility Bed Mobility Overal bed mobility: Needs Assistance Bed Mobility: Supine to Sit     Supine to sit: Min assist     General bed mobility comments: Assist for trunk support and for line management. VCs for hand placement.  Transfers Overall transfer level: Needs assistance Equipment used: Rolling walker (2 wheeled) Transfers: Sit to/from Stand Sit to Stand: Mod assist;+2 safety/equipment         General transfer comment: +2 assist for boost to stand and to safely stabilize balance prior to ambulation. +2 assist also necessary for chair follow as pt fatigues quickly and suddenly becomes dizzy. VCs for safe hand placement and to stand tall.    Balance Overall balance  assessment: Needs assistance Sitting-balance support: No upper extremity supported;Feet supported Sitting balance-Leahy Scale: Good     Standing balance support: Bilateral upper extremity supported;During functional activity Standing balance-Leahy Scale: Poor                     ADL Overall ADL's : Needs assistance/impaired     Grooming: Wash/dry hands;Wash/dry face;Set up;Sitting                   Toilet Transfer: Moderate assistance;Cueing for safety;Ambulation;RW Toilet Transfer Details (indicate cue type and reason): simulated to chair Toileting- Clothing Manipulation and Hygiene: Maximal assistance;Sit to/from stand Toileting - Clothing Manipulation Details (indicate cue type and reason): pt requires bilateral UE support for balance and unable to complete pericare     Functional mobility during ADLs: Moderate assistance;Rolling walker General ADL Comments: Fatigues quickly and becomes dizzy      Vision                     Perception     Praxis      Cognition   Behavior During Therapy: WFL for tasks assessed/performed Overall Cognitive Status: Within Functional Limits for tasks assessed                       Extremity/Trunk Assessment               Exercises     Shoulder Instructions       General Comments      Pertinent Vitals/ Pain       Pain Assessment: Faces Faces  Pain Scale: Hurts little more Pain Location: L foot and catheter Pain Descriptors / Indicators: Aching;Discomfort;Grimacing Pain Intervention(s): Limited activity within patient's tolerance;Monitored during session;Repositioned (Requested RN check catheter placement)  Home Living                                          Prior Functioning/Environment              Frequency Min 2X/week     Progress Toward Goals  OT Goals(current goals can now be found in the care plan section)  Progress towards OT goals: Progressing toward  goals  Acute Rehab OT Goals Patient Stated Goal: get home OT Goal Formulation: With patient Time For Goal Achievement: 11/22/15 Potential to Achieve Goals: Fair ADL Goals Pt Will Perform Grooming: with set-up;with supervision;standing Pt Will Perform Lower Body Bathing: with min assist;sitting/lateral leans;sit to/from stand Pt Will Perform Lower Body Dressing: with mod assist;with min assist;sitting/lateral leans;sit to/from stand Pt Will Transfer to Toilet: with supervision;ambulating;grab bars Pt Will Perform Toileting - Clothing Manipulation and hygiene: with min guard assist;sit to/from stand  Plan Discharge plan remains appropriate    Co-evaluation    PT/OT/SLP Co-Evaluation/Treatment: Yes Reason for Co-Treatment: For patient/therapist safety   OT goals addressed during session: ADL's and self-care;Strengthening/ROM;Proper use of Adaptive equipment and DME      End of Session Equipment Utilized During Treatment: Gait belt;Rolling walker   Activity Tolerance Patient limited by fatigue   Patient Left in chair;with call bell/phone within reach;with chair alarm set   Nurse Communication Mobility status        Time: [id number] OT Time Calculation (min): 25 min  Charges: OT General Charges $OT Visit: 1 Procedure OT Treatments $Self Care/Home Management : 8-22 mins  Redmond Baseman, OTR/L Pager: [phone] 11/21/2015, 11:50 AM

## 2015-11-21 NOTE — Care Management Important Message (Signed)
Important Message  Patient Details  Name: Mark [name] MRN: [id number] Date of Birth: 1941/03/02   Medicare Important Message Given:  Yes    [name] May 11/21/2015, 11:43 AM

## 2015-11-22 LAB — CBC
HCT: 32 % — ABNORMAL LOW (ref 39.0–52.0)
Hemoglobin: 10.4 g/dL — ABNORMAL LOW (ref 13.0–17.0)
MCH: 31.7 pg (ref 26.0–34.0)
MCHC: 32.5 g/dL (ref 30.0–36.0)
MCV: 97.6 fL (ref 78.0–100.0)
PLATELETS: 164 10*3/uL (ref 150–400)
RBC: 3.28 MIL/uL — AB (ref 4.22–5.81)
RDW: 17.2 % — ABNORMAL HIGH (ref 11.5–15.5)
WBC: 6.7 10*3/uL (ref 4.0–10.5)

## 2015-11-22 LAB — GLUCOSE, CAPILLARY
Glucose-Capillary: 155 mg/dL — ABNORMAL HIGH (ref 65–99)
Glucose-Capillary: 147 mg/dL — ABNORMAL HIGH (ref 65–99)
Glucose-Capillary: 197 mg/dL — ABNORMAL HIGH (ref 65–99)
Glucose-Capillary: 221 mg/dL — ABNORMAL HIGH (ref 65–99)

## 2015-11-22 LAB — RENAL FUNCTION PANEL
ALBUMIN: 2.2 g/dL — AB (ref 3.5–5.0)
ANION GAP: 11 (ref 5–15)
BUN: 32 mg/dL — ABNORMAL HIGH (ref 6–20)
CALCIUM: 8.4 mg/dL — AB (ref 8.9–10.3)
CO2: 25 mmol/L (ref 22–32)
CREATININE: 4.69 mg/dL — AB (ref 0.61–1.24)
Chloride: 96 mmol/L — ABNORMAL LOW (ref 101–111)
GFR, EST AFRICAN AMERICAN: 13 mL/min — AB (ref >60)
GFR, EST NON AFRICAN AMERICAN: 11 mL/min — AB (ref >60)
Glucose, Bld: 138 mg/dL — ABNORMAL HIGH (ref 65–99)
PHOSPHORUS: 5 mg/dL — AB (ref 2.5–4.6)
Potassium: 4.1 mmol/L (ref 3.5–5.1)
SODIUM: 132 mmol/L — AB (ref 135–145)

## 2015-11-22 LAB — PROTIME-INR
INR: 1.75
Prothrombin Time: 20.7 seconds — ABNORMAL HIGH (ref 11.4–15.2)

## 2015-11-22 LAB — HEPARIN LEVEL (UNFRACTIONATED): HEPARIN UNFRACTIONATED: 0.33 [IU]/mL (ref 0.30–0.70)

## 2015-11-22 MED ORDER — LIDOCAINE-PRILOCAINE 2.5-2.5 % EX CREA: 1.0000 "application " | TOPICAL_CREAM | CUTANEOUS | Status: DC | PRN

## 2015-11-22 MED ORDER — HEPARIN SODIUM (PORCINE) 1000 UNIT/ML DIALYSIS: 1000.0000 [IU] | INTRAMUSCULAR | Status: DC | PRN

## 2015-11-22 MED ORDER — LIDOCAINE HCL (PF) 1 % IJ SOLN
5.0000 mL | INTRAMUSCULAR | Status: DC | PRN
Start: 2015-11-22 — End: 2015-11-22

## 2015-11-22 MED ORDER — LIDOCAINE HCL (PF) 1 % IJ SOLN: 5.0000 mL | INTRAMUSCULAR | Status: DC | PRN

## 2015-11-22 MED ORDER — SODIUM CHLORIDE 0.9 % IV SOLN: 100.0000 mL | INTRAVENOUS | Status: DC | PRN

## 2015-11-22 MED ORDER — PENTAFLUOROPROP-TETRAFLUOROETH EX AERO: 1.0000 "application " | INHALATION_SPRAY | CUTANEOUS | Status: DC | PRN

## 2015-11-22 MED ORDER — HEPARIN SODIUM (PORCINE) 1000 UNIT/ML DIALYSIS
1000.0000 [IU] | INTRAMUSCULAR | Status: DC | PRN
Start: 2015-11-22 — End: 2015-11-22

## 2015-11-22 MED ORDER — ALTEPLASE 2 MG IJ SOLR: 2.0000 mg | Freq: Once | INTRAMUSCULAR | Status: DC | PRN

## 2015-11-22 MED ORDER — WARFARIN SODIUM 5 MG PO TABS
5.0000 mg | ORAL_TABLET | Freq: Once | ORAL | Status: AC
  Administered 2015-11-22: 5 mg via ORAL
  Filled 2015-11-22: qty 1

## 2015-11-22 MED ORDER — HEPARIN SODIUM (PORCINE) 1000 UNIT/ML DIALYSIS
20.0000 [IU]/kg | INTRAMUSCULAR | Status: DC | PRN
Start: 2015-11-22 — End: 2015-11-22

## 2015-11-22 MED ORDER — HEPARIN SODIUM (PORCINE) 1000 UNIT/ML DIALYSIS: 20.0000 [IU]/kg | INTRAMUSCULAR | Status: DC | PRN

## 2015-11-22 NOTE — Progress Notes (Signed)
PT Cancellation Note  Patient Details Name: Mark [name] MRN: [id number] DOB: 05-17-1940   Cancelled Treatment:    Reason Eval/Treat Not Completed: Patient at procedure or test/unavailable.  Pt at HD this am.  Will f/u another time.     [name], Thornton Papas 11/22/2015, 8:27 AM

## 2015-11-22 NOTE — Progress Notes (Addendum)
Patient ID: Mark [name], male   DOB: [birth year], 75 y.o.   MRN: [id number]                                                                PROGRESS NOTE                                                                                                                                                                                                             Patient Demographics:    Mark [name], is a 75 y.o. male, DOB - 06-29-1940, [id number]  Admit date - 10/28/2015   Admitting Physician Etta Quill, DO  Outpatient Primary MD for the patient is Rochel Brome, MD  LOS - 25  Outpatient Specialists:   Chief Complaint  Patient presents with  . 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 Patient presents to ED with c/o 'his toe is dead'. Creatinine baseline appears to be 1.8-2.3.  Primary nephrologist is Dr. Justin Mend.  Saw him on 06/26/2015 with BUN 27, creatinine 1.88.  Admitted on 10/28/2015 with necrotic left great toe with cellulitis.  Creatinine on admission was 2.6.  Seen by [name] in consult on 7/25.  His Prograf level was found to be low and was increased.  Patient underwent abdominal aortogram with angioplasty of the left anterior tibial artery and left popliteal artery on 11/05/2015.  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  He was on Vancomycin and Zosyn from 7/24-8/3.  Currently only on Zosyn.  He developed some dyspnea in the setting of being positive about 13 liters since admission with evidence of volume overload on exam. Worsening renal failure. Placement of temporary hemodialysis catheter on 8/10. Now started on HD. Will follow clinical response and renal function recovery.   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 -cardiology and nephrology were managing diuretics/fluid overload status with HD now -2-D echo did not show any wall motion abnormalities and the patient does not appear to have any ischemic changes on his EKG -Renal allograft function worse today with Creatinine 3.7 -Per nephrology rec's will continue HD for now  Gangrene of left  Toe/Toe Amputation: -S /P atherectomy and DCB Left popliteal artery and PTA left ATA (POD 5) -S/P Ray amputation of left great toe (POD 4) -Palpable Left popliteal pulse; wound is healing properly -vascular surgery on board. -patient has completed antibiotic therapy  Urinary retention  - Bladder scan showing more than 600 mL , staff unable to do in and out , urology consulted . Foley catheter inserted on 8/16, will attempt voiding trial today. - started on flomax  Anemia of chronic diseases,  -FOBT positive, but no signs of acute bleeding appreciated -with ongoing hemodilution from anasarca/fluid overload, Hgb has remained stable around 10.5 for several days.  -Continue PPI twice a day -follow Hgb trend   Diarrhea - C. difficile negative 2 during this admission - will continue probiotic - PRN lomotil ordered  - No fever, no nausea, no vomiting, no abdominal pain. - symptoms improved according to patient   Acute on Chronic kidney disease stage IV, status post renal transplant , baseline creatinine around 1.7, now up to 4.9  and with oliguria -thought to be acute process caused by contrast  -will continue tacrolimus -anticipation of potential permanent dialysis catheter and AVF access later today (8/15) -renal service on board, will follow rec's -patient remained oliguric and HD dependent currently.  A fib with aberrancy -  -continue b-blocker for rate controlled -on coumadin for anticoagulation (dose per pharmacy); on hold for placement of permanent HD catheter and AVF (procedures plan for later today 11/19/15). Vascular surgery to clear for resumption on coumadin. -cardiology on board, will follow rec's -CHADsVASC score 4  DM2 -  uncontrolled -Hemoglobin A1c 8.2 -will continue SSI, meal coverage and lantus  Hypertension:  -Continue norvasc and metoprolol -Prn hydralazine also ordered - On the lower side today, will continue to monitor closely  Hypothyroidism - continue Synthroid  Protein Calorie Malnutrition -will continue Prostat -last albumin 2.1 -nutritional service consulted, will follow rec's  Dyslipidemia-continue Crestor  Pulmonary hypertension:  Mr. Watrous has moderate pulmonary hypertension.    -Cardiology Recommending outpatient sleep study and PFTs.   -He does have grade 2 diastolic dysfunction and will benefit of volume control -currently needing HD -will follow cardiology and renal service rec's  Code Status : FULL CODE  Family Communication  : none at bedside  Disposition Plan  :  ? Will likely need SNF at discharge; will follow PT evaluation.  Barriers For Discharge : worsening renal function, and the patient on dialysis now  Consults  :  Vascular surgery, nephrology, cardiology  Procedures  : Atherectomy and drug coated balloon angioplasty 8/1 , left ray amputation 8/2,  Right IJ tunneled dialysis catheter placement with US guidance,  leftbrachiocephalic arteriovenous fistula placement on 8/15  DVT Prophylaxis  :  Heparin   >> warfarin  Lab Results  Component Value Date   PLT 164 11/22/2015    Antibiotics  :     10/28/15 2200  piperacillin-tazobactam (ZOSYN) IVPB 3.375 g  Status:  Discontinued     3.375 g 12.5 mL/hr over 240 Minutes Intravenous Every 8 hours 10/28/15 2120 11/09/15 1057   10/28/15 2200  vancomycin (VANCOCIN) 2,500 mg in sodium chloride 0.9 % 500 mL IVPB     2,500 mg 250 mL/hr over 120 Minutes Intravenous  Once 10/28/15 2154 10/29/15 2330        Objective:   Vitals:   11/22/15 1138 11/22/15 1303 11/22/15 1335 11/22/15 1523  BP: (!) 141/63 119/64 (!) 120/55 (!) 134/39  Pulse: 92 97 82   Resp:  18    Temp: 97.7 F (36.5 C) 98.4 F (36.9 C)    TempSrc: Oral Oral    SpO2: 98% 97%    Weight: 91.4 kg (201 lb 8 oz)     Height:        Wt Readings from Last 3 Encounters:  11/22/15 91.4 kg (201 lb 8 oz)  09/05/15 103.1 kg (227 lb 3.2 oz)  08/05/15 101.8 kg (224 lb 6.4 oz)     Intake/Output Summary (Last 24 hours) at 11/22/15 1611 Last data filed at 11/22/15 1302  Gross per 24 hour  Intake              220 ml  Output             4100 ml  Net            -3880 ml     Physical Exam General: AAOX3, in no acute distress. Head/eyes: [place]/AT,PERRLA, no icterus Neck:Supple Neck,  Lungs: Symmetrical Chest wall movement, Good air movement bilaterally, decrease breath sounds at bases, no wheezing  Heart: rate controlled. Positive A. Fib on telemetry, no rubs or gallops Abdomen: +ve B.Sounds, Abd Soft,Suprapubic tenderness,  No rebound - guarding or rigidity. Extremities: Diffuse anasarca, 2-3+ pitting edema throughout; left ray amputation, with clean dressing and just minimal serosanguineous drainage.   Data Review:    CBC  Recent Labs Lab 11/18/15 0350 11/19/15 0027 11/20/15 0500 11/21/15 0315 11/22/15 0419  WBC 9.5 6.9 8.1 7.0 6.7  HGB 10.8* 10.7* 10.8* 10.4* 10.4*  HCT 32.8* 32.6* 33.8* 31.7* 32.0*  PLT 190 170 166 155 164  MCV 94.0 98.5 96.6 97.2 97.6  MCH 30.9 32.3 30.9 31.9 31.7  MCHC 32.9  32.8 32.0 32.8 32.5  RDW 17.2* 17.4* 17.6* 17.5* 17.2*    Chemistries   Recent Labs Lab 11/17/15 1203 11/18/15 0350 11/19/15 0027 11/20/15 0730 11/22/15 0745  NA 129* 132* 132* 131* 132*  K 3.9 3.8 4.0 3.9 4.1  CL 97* 98* 100* 100* 96*  CO2 26 25 27 24 25   GLUCOSE 237* 109* 159* 104* 138*  BUN 34* 42* 22* 34* 32*  CREATININE 3.70* 4.05* 3.33* 4.38* 4.69*  CALCIUM 7.8* 8.0* 7.8* 8.3* 8.4*   ------------------------------------------------------------------------------------------------------------------ No results for input(s): CHOL, HDL, LDLCALC, TRIG, CHOLHDL, LDLDIRECT in the last 72 hours.  Lab Results  Component Value Date   HGBA1C 8.2 (H) 10/29/2015   ------------------------------------------------------------------------------------------------------------------ No results for input(s): TSH, T4TOTAL, T3FREE, THYROIDAB in the last 72 hours.  Invalid input(s): FREET3 ------------------------------------------------------------------------------------------------------------------ No results for input(s): VITAMINB12, FOLATE, FERRITIN, TIBC, IRON, RETICCTPCT in the last 72 hours.  Coagulation profile  Recent Labs Lab 11/18/15 0350 11/19/15 0027 11/20/15 0500 11/21/15 0315 11/22/15 0419  INR 1.84 1.75 1.69 1.59 1.75    Cardiac Enzymes  No results for input(s): CKMB, TROPONINI, MYOGLOBIN in the last 168 hours.  Invalid input(s): CK ------------------------------------------------------------------------------------------------------------------ No results found for: BNP  Inpatient Medications  Scheduled Meds: . allopurinol  150 mg Oral Daily  . aspirin  81 mg Oral Daily  . calcitRIOL  0.5 mcg Oral Daily  . docusate sodium  100 mg Oral Daily  . famotidine  20 mg Oral QHS  . feeding supplement  1 Container Oral TID WC  . hydrALAZINE  25 mg Oral Q8H  . insulin aspart  0-15 Units Subcutaneous TID WC  . insulin aspart  5 Units Subcutaneous TID WC  .  insulin glargine  20 Units Subcutaneous QHS  . levothyroxine  137 mcg Oral QAC breakfast  . magnesium oxide  400 mg Oral BID  . metoprolol succinate  100 mg Oral Daily  . pantoprazole  40 mg Oral BID  . predniSONE  5 mg Oral Daily  . rosuvastatin  10 mg Oral QPM  . saccharomyces boulardii  250 mg Oral BID  . tacrolimus  1 mg Oral BID  . tamsulosin  0.4 mg Oral QPC supper  . warfarin  5 mg Oral ONCE-1800  . Warfarin - Pharmacist Dosing Inpatient   Does not apply q1800   Continuous Infusions: . heparin 1,150 Units/hr (11/21/15 1606)   PRN Meds:.alum & mag hydroxide-simeth, diphenoxylate-atropine, guaiFENesin-dextromethorphan, hydrALAZINE, HYDROcodone-acetaminophen, labetalol, levalbuterol, metoprolol, ondansetron (ZOFRAN) IV, oxyCODONE, phenol  Micro Results Recent Results (from the past 240 hour(s))  Surgical pcr screen     Status: Abnormal   Collection Time: 11/19/15  5:44 AM  Result Value Ref Range Status   MRSA, PCR NEGATIVE NEGATIVE Final   Staphylococcus aureus POSITIVE (A) NEGATIVE Final    Comment:        The Xpert SA Assay (FDA approved for NASAL specimens in patients over 82 years of age), is one component of a comprehensive surveillance program.  Test performance has been validated by [facility] for patients greater than or equal to 36 year old. It is not intended to diagnose infection nor to guide or monitor treatment.     Radiology Reports Ir Fluoro Guide Cv Line Right  Result Date: 11/14/2015 INDICATION: Acute kidney injury and needs hemodialysis. Plan for placement of non tunneled catheter due to elevated INR level. EXAM: FLUOROSCOPIC AND ULTRASOUND GUIDED PLACEMENT OF A NON TUNNELED DIALYSIS CATHETER Physician: Stephan Minister. Henn, MD MEDICATIONS: None ANESTHESIA/SEDATION: None FLUOROSCOPY TIME:  Fluoroscopy Time: 24 seconds COMPLICATIONS: None immediate. PROCEDURE: Informed consent was obtained for placement of a non tunneled dialysis catheter. The patient was  placed supine on the interventional table. Ultrasound confirmed a patent right internal jugular vein. Ultrasound images were obtained for documentation. The right side of the neck was prepped and draped in a sterile fashion. The right neck was anesthetized with 1% lidocaine. Maximal barrier sterile technique was utilized including caps, mask, sterile gowns, sterile gloves, sterile drape, hand hygiene and skin antiseptic. A small incision was made with #11 blade scalpel. A 21 gauge needle directed into the right internal jugular vein with ultrasound guidance. A micropuncture dilator set was placed. 20 cm Mahurkar catheter was selected. J wire was advanced into the IVC. Tract was dilated to accommodate the catheter. Catheter tip placed near the superior cavoatrial junction. All the lumens aspirated and flushed well. Appropriate amount of heparin was placed in the dialysis lumens. Catheter was sutured to the skin with Prolene suture. Fluoroscopic and ultrasound images were taken and saved for documentation. FINDINGS: Patent right  internal jugular vein. Catheter tip near the superior cavoatrial junction. IMPRESSION: Successful placement of a non tunneled dialysis catheter using ultrasound and fluoroscopic guidance. Electronically Signed   By: Markus Daft M.D.   On: 11/14/2015 19:52   Ir US Guide Vasc Access Right  Result Date: 11/14/2015 INDICATION: Acute kidney injury and needs hemodialysis. Plan for placement of non tunneled catheter due to elevated INR level. EXAM: FLUOROSCOPIC AND ULTRASOUND GUIDED PLACEMENT OF A NON TUNNELED DIALYSIS CATHETER Physician: Stephan Minister. Henn, MD MEDICATIONS: None ANESTHESIA/SEDATION: None FLUOROSCOPY TIME:  Fluoroscopy Time: 24 seconds COMPLICATIONS: None immediate. PROCEDURE: Informed consent was obtained for placement of a non tunneled dialysis catheter. The patient was placed supine on the interventional table. Ultrasound confirmed a patent right internal jugular vein. Ultrasound  images were obtained for documentation. The right side of the neck was prepped and draped in a sterile fashion. The right neck was anesthetized with 1% lidocaine. Maximal barrier sterile technique was utilized including caps, mask, sterile gowns, sterile gloves, sterile drape, hand hygiene and skin antiseptic. A small incision was made with #11 blade scalpel. A 21 gauge needle directed into the right internal jugular vein with ultrasound guidance. A micropuncture dilator set was placed. 20 cm Mahurkar catheter was selected. J wire was advanced into the IVC. Tract was dilated to accommodate the catheter. Catheter tip placed near the superior cavoatrial junction. All the lumens aspirated and flushed well. Appropriate amount of heparin was placed in the dialysis lumens. Catheter was sutured to the skin with Prolene suture. Fluoroscopic and ultrasound images were taken and saved for documentation. FINDINGS: Patent right internal jugular vein. Catheter tip near the superior cavoatrial junction. IMPRESSION: Successful placement of a non tunneled dialysis catheter using ultrasound and fluoroscopic guidance. Electronically Signed   By: Markus Daft M.D.   On: 11/14/2015 19:52   Dg Chest Port 1 View  Result Date: 11/19/2015 CLINICAL DATA:  As post dialysis catheter placement EXAM: PORTABLE CHEST 1 VIEW COMPARISON:  11/09/2015 FINDINGS: Previously seen temper a catheter is been removed in a new right jugular dialysis catheter placed in satisfactory position. Cardiac shadow is within normal limits. Vascular congestion is noted likely related to volume overload. No sizable effusion is seen. IMPRESSION: Vascular congestion likely related to volume overload. No pneumothorax is noted. Electronically Signed   By: Inez Catalina M.D.   On: 11/19/2015 10:03   Dg Chest Port 1 View  Result Date: 11/09/2015 CLINICAL DATA:  Shortness of breath EXAM: PORTABLE CHEST 1 VIEW COMPARISON:  November 06, 2015 FINDINGS: Mild cardiomegaly. The  hila and mediastinum are unchanged. Diffuse bilateral interstitial opacities again identified and similar in the interval. The opacity is a little more focal in the left suprahilar region which is increased since November 05, 2015 but unchanged since November 06, 2015. No other acute interval changes. There may be tiny bilateral effusions with blunting of the costophrenic angles. IMPRESSION: 1. Suspected tiny bilateral pleural effusions and diffuse increased interstitial markings are most consistent with pulmonary edema. Atypical viral infection could have a similar appearance. Recommend clinical correlation. *Opacity in the left suprahilar region is a little more focal when compared November 05, 2015. The difference could be due to confluence of shadows. Recommend follow-up to resolution. Electronically Signed   By: Dorise Bullion III M.D   On: 11/09/2015 09:14   Dg Chest Port 1 View  Result Date: 11/06/2015 CLINICAL DATA:  75 year old male with increasing shortness of breath and abnormal left side pulmonary auscultation. Initial encounter. EXAM: PORTABLE  CHEST 1 VIEW COMPARISON:  0508 hours today and earlier. FINDINGS: Portable AP semi upright view at 2127 hours. Continued and mildly increased perihilar and basilar predominant interstitial pulmonary opacity. Stable cardiac size and mediastinal contours. No pneumothorax. No pleural effusion or consolidation identified. IMPRESSION: Increased bilateral pulmonary interstitial opacity. Top differential considerations include progression of interstitial edema versus viral/ atypical respiratory infection. No pleural effusion identified. Electronically Signed   By: Genevie Ann M.D.   On: 11/06/2015 21:42   Dg Chest Port 1 View  Result Date: 11/06/2015 CLINICAL DATA:  75 year old male with aspiration of liquid. EXAM: PORTABLE CHEST 1 VIEW COMPARISON:  Chest radiograph dated 11/05/2015 FINDINGS: Mild cardiomegaly with central vascular congestion. Bibasilar Interstitial prominence  and nodular densities, new from prior study may represent atelectatic changes versus infiltrate. There is no pleural effusion or pneumothorax. Top-normal cardiac silhouette. No acute osseous pathology. IMPRESSION: Mild congestive changes with bibasilar subsegmental atelectasis versus infiltrate. Electronically Signed   By: Anner Crete M.D.   On: 11/06/2015 05:16   Dg Chest Port 1 View  Result Date: 11/05/2015 CLINICAL DATA:  Shortness of breath EXAM: PORTABLE CHEST 1 VIEW COMPARISON:  March 14, 2015 FINDINGS: The lungs are clear. Heart is mildly enlarged with pulmonary vascularity within normal limits. No adenopathy. No bone lesions. IMPRESSION: Mild cardiac enlargement.  No edema or consolidation. Electronically Signed   By: Lowella Grip III M.D.   On: 11/05/2015 09:42   Dg Foot Complete Left  Result Date: 10/28/2015 CLINICAL DATA:  Left big toe pain. EXAM: LEFT FOOT - COMPLETE 3+ VIEW COMPARISON:  None. FINDINGS: There is no evidence of fracture or dislocation. No evidence of dystrophic bony changes. There is soft tissue swelling of the distal first digit. Punctate lucencies within the area of soft tissue swelling may represent entrapped fat or potentially foci of gas. Heavy vascular calcifications are seen. IMPRESSION: No evidence of fracture of the left foot. Soft tissue swelling of the distal first digit with punctate lucencies within the area of swelling, which may represent entrapped fat or potentially foci of gas. Please correlate clinically regarding the possibility of cellulitis. Electronically Signed   By: Fidela Salisbury M.D.   On: 10/28/2015 21:01  Dg Fluoro Guide Cv Line-no Report  Result Date: 11/19/2015 CLINICAL DATA:  FLOURO GUIDE CV LINE Fluoroscopy was utilized by the requesting physician.  No radiographic interpretation.      [facility] Labs, Hannibal Skalla M.D on 11/22/2015 at 4:11 PM [phone]

## 2015-11-22 NOTE — Progress Notes (Signed)
Urinary catheter pulled per physicians order. Initiated by urology, never put LDA in [place]. Deflated 55ml out of balloon, urine- 224ml.

## 2015-11-22 NOTE — Progress Notes (Signed)
ANTICOAGULATION CONSULT NOTE - Follow Up Consult  Pharmacy Consult for Heparin/Coumadin Indication: afib   Allergies  Allergen Reactions  . Tape Other (See Comments)    SKIN WILL TEAR!!    Patient Measurements: Height: 5\' 9"  (175.3 cm) Weight: 201 lb 8 oz (91.4 kg) IBW/kg (Calculated) : 70.7  HDWt: 92kg  Vital Signs: Temp: 98.4 F (36.9 C) (08/18 1303) Temp Source: Oral (08/18 1303) BP: 120/55 (08/18 1335) Pulse Rate: 82 (08/18 1335)  Labs:  Recent Labs  11/20/15 0500 11/20/15 0730 11/20/15 1441 11/21/15 0315 11/22/15 0419 11/22/15 0745  HGB 10.8*  --   --  10.4* 10.4*  --   HCT 33.8*  --   --  31.7* 32.0*  --   PLT 166  --   --  155 164  --   LABPROT 20.1*  --   --  19.1* 20.7*  --   INR 1.69  --   --  1.59 1.75  --   HEPARINUNFRC 0.28*  --  0.35 0.26* 0.33  --   CREATININE  --  4.38*  --   --   --  4.69*    Estimated Creatinine Clearance: 15.2 mL/min (by C-G formula based on SCr of 4.69 mg/dL).   Assessment: 75 yo m presents after fall getting out of shower and primary MD sent pt to ED with diabetic foot infection. Toe found to be grossly infected and necrotic    PMH: afib, DM, HTN, hypothyroid, renal transplant (~12 yr ago)  AC: Warfarin PTA for afib. S/P arteriogram 8/1 with successful arthrectomy and balloon angioplasty. Heparin to warfarin bridge. HL = 0.33 on 1150 units/hr, INR 1.75  --Warfarin PTA dosing: 5mg  daily except 2.5mg  on Tuesdays and Thursdays.   Heme: H&H 10.4/32, Plt 164  Goal of Therapy:  Heparin level 0.3-0.7 units/ml  INR 2-3 Monitor platelets by anticoagulation protocol: Yes    Plan:  -Heparin at 1150 units/h -Warfarin 5mg  x 1 dose tonight -Daily HL/INR/CBC -Monitor s/sx bleeding  Levester Fresh, PharmD, BCPS, [facility] Clinical Pharmacist Pager [phone] 11/22/2015 1:45 PM

## 2015-11-22 NOTE — Procedures (Signed)
I have seen and examined this patient and agree with the plan of care   K 4.1   Phos 5  Ca 8.4   Hb 10.4   Derrek Puff W 11/22/2015, 11:24 AM

## 2015-11-23 DIAGNOSIS — Z794 Long term (current) use of insulin: Secondary | ICD-10-CM

## 2015-11-23 DIAGNOSIS — E1165 Type 2 diabetes mellitus with hyperglycemia: Secondary | ICD-10-CM

## 2015-11-23 DIAGNOSIS — E1122 Type 2 diabetes mellitus with diabetic chronic kidney disease: Secondary | ICD-10-CM

## 2015-11-23 DIAGNOSIS — N185 Chronic kidney disease, stage 5: Secondary | ICD-10-CM

## 2015-11-23 LAB — CBC
HEMATOCRIT: 33.2 % — AB (ref 39.0–52.0)
HEMOGLOBIN: 10.8 g/dL — AB (ref 13.0–17.0)
MCH: 31.9 pg (ref 26.0–34.0)
MCHC: 32.5 g/dL (ref 30.0–36.0)
MCV: 97.9 fL (ref 78.0–100.0)
Platelets: 163 10*3/uL (ref 150–400)
RBC: 3.39 MIL/uL — AB (ref 4.22–5.81)
RDW: 17.3 % — AB (ref 11.5–15.5)
WBC: 5.3 10*3/uL (ref 4.0–10.5)

## 2015-11-23 LAB — GLUCOSE, CAPILLARY
GLUCOSE-CAPILLARY: 105 mg/dL — AB (ref 65–99)
GLUCOSE-CAPILLARY: 227 mg/dL — AB (ref 65–99)
Glucose-Capillary: 166 mg/dL — ABNORMAL HIGH (ref 65–99)
Glucose-Capillary: 219 mg/dL — ABNORMAL HIGH (ref 65–99)

## 2015-11-23 LAB — PROTIME-INR
INR: 2.04
PROTHROMBIN TIME: 23.3 s — AB (ref 11.4–15.2)

## 2015-11-23 LAB — HEPARIN LEVEL (UNFRACTIONATED): Heparin Unfractionated: 0.32 IU/mL (ref 0.30–0.70)

## 2015-11-23 MED ORDER — WARFARIN SODIUM 5 MG PO TABS
5.0000 mg | ORAL_TABLET | Freq: Once | ORAL | Status: AC
  Administered 2015-11-23: 5 mg via ORAL
  Filled 2015-11-23: qty 1

## 2015-11-23 MED ORDER — POLYETHYLENE GLYCOL 3350 17 G PO PACK
34.0000 g | PACK | Freq: Two times a day (BID) | ORAL | Status: AC
  Administered 2015-11-23: 34 g via ORAL
  Filled 2015-11-23 (×2): qty 2

## 2015-11-23 MED ORDER — WARFARIN SODIUM 5 MG PO TABS: 5.0000 mg | ORAL_TABLET | Freq: Once | ORAL | Status: DC

## 2015-11-23 MED ORDER — SENNOSIDES-DOCUSATE SODIUM 8.6-50 MG PO TABS
1.0000 | ORAL_TABLET | Freq: Two times a day (BID) | ORAL | Status: DC
  Administered 2015-11-23 – 2015-11-26 (×4): 1 via ORAL
  Filled 2015-11-23 (×6): qty 1

## 2015-11-23 NOTE — Progress Notes (Signed)
11/23/2015 12:23 PM Hemodialysis Outpatient Note;Mark [name] has been accepted at the [facility] on a Monday, Wednesday and Friday 2nd shift schedule. He will need to arrive for his appointment at 12:00PM on Monday August 21st.Thank you.Jenetta Downer.

## 2015-11-23 NOTE — Progress Notes (Signed)
[facility] ROUNDING NOTE   Subjective:   Interval History: placement at [facility]   Objective:  Vital signs in last 24 hours:  Temp:  [97.4 F (36.3 C)-98.4 F (36.9 C)] 98.2 F (36.8 C) (08/19 0532) Pulse Rate:  [67-97] 92 (08/19 0532) Resp:  [16-23] 18 (08/19 0532) BP: (109-151)/(39-71) 128/64 (08/19 0532) SpO2:  [97 %-99 %] 99 % (08/19 0532) Weight:  [91.3 kg (201 lb 4.8 oz)-96.6 kg (213 lb)] 91.3 kg (201 lb 4.8 oz) (08/19 0532)  Weight change:  Filed Weights   11/22/15 0743 11/22/15 1138 11/23/15 0532  Weight: 96.6 kg (213 lb) 91.4 kg (201 lb 8 oz) 91.3 kg (201 lb 4.8 oz)    Intake/Output: I/O last 3 completed shifts: In: 200 [P.O.:200] Out: [id number] [Urine:250; Other:4000]   Intake/Output this shift:  No intake/output data recorded.  75 y.o. male. 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 Creatine baseline appears to be 1.8-2.0. Primary nephrologist is Dr. Justin Mend. Saw him on 06/26/2015 with BUN 27, creatinine 1.88. Admitted on 10/28/2015 with necrotic left great toe with cellulitis. Creatinine on admission was 2.6. Seen by [name] in consult on 7/25. Creatinine had been improving until last couple days when started to trend back up post aortogram and in setting of being up 13 liters since admission.  Alert oriented  CVS irregular irregular RS  Clear anterior lung fields good air entry AS soft obese non tender EXT s/p left fem pop and toe amputation    Basic Metabolic Panel:  Recent Labs Lab 11/17/15 1203 11/18/15 0350 11/19/15 0027 11/20/15 0730 11/22/15 0745  NA 129* 132* 132* 131* 132*  K 3.9 3.8 4.0 3.9 4.1  CL 97* 98* 100* 100* 96*  CO2 26 25 27 24 25   GLUCOSE 237* 109* 159* 104* 138*  BUN 34* 42* 22* 34* 32*  CREATININE 3.70* 4.05* 3.33* 4.38* 4.69*  CALCIUM 7.8* 8.0* 7.8* 8.3* 8.4*  PHOS 4.7* 5.0*  --  5.9* 5.0*     Liver Function Tests:  Recent Labs Lab 11/17/15 1203 11/18/15 0350 11/20/15 0730 11/22/15 0745  ALBUMIN 2.5* 2.3* 2.2* 2.2*   No results for input(s): LIPASE, AMYLASE in the last 168 hours. No results for input(s): AMMONIA in the last 168 hours.  CBC:  Recent Labs Lab 11/19/15 0027 11/20/15 0500 11/21/15 0315 11/22/15 0419 11/23/15 0306  WBC 6.9 8.1 7.0 6.7 5.3  HGB 10.7* 10.8* 10.4* 10.4* 10.8*  HCT 32.6* 33.8* 31.7* 32.0* 33.2*  MCV 98.5 96.6 97.2 97.6 97.9  PLT 170 166 155 164 163    Cardiac Enzymes: No results for input(s): CKTOTAL, CKMB, CKMBINDEX, TROPONINI in the last 168 hours.  BNP: Invalid input(s): POCBNP  CBG:  Recent Labs Lab 11/22/15 0557 11/22/15 1312 11/22/15 1615 11/22/15 2123 11/23/15 0619  GLUCAP 155* 147* 197* 221* 105*    Microbiology: Results for orders placed or performed during the hospital encounter of 10/28/15  Blood Cultures x 2 sites     Status: None   Collection Time: 10/28/15 10:00 PM  Result Value Ref Range Status   Specimen Description BLOOD RIGHT FOREARM  Final   Special Requests BOTTLES DRAWN AEROBIC AND ANAEROBIC 4CC  Final   Culture NO GROWTH 5 DAYS  Final   Report Status 11/02/2015 FINAL  Final  Blood Cultures x 2 sites     Status: None   Collection Time:  10/28/15 11:04 PM  Result Value Ref Range Status   Specimen Description BLOOD LEFT ARM  Final   Special Requests AEROBIC BOTTLE ONLY 7ML  Final   Culture NO GROWTH 5 DAYS  Final   Report Status 11/02/2015 FINAL  Final  C difficile quick scan w PCR reflex     Status: None   Collection Time: 11/04/15  9:42 AM  Result Value Ref Range Status   C Diff antigen NEGATIVE NEGATIVE Final   C Diff toxin NEGATIVE NEGATIVE Final   C Diff interpretation No C. difficile detected.  Final  Surgical PCR screen     Status: Abnormal   Collection Time: 11/05/15 11:18 PM  Result Value Ref Range Status   MRSA, PCR NEGATIVE NEGATIVE Final   Staphylococcus aureus POSITIVE (A)  NEGATIVE Final    Comment:        The Xpert SA Assay (FDA approved for NASAL specimens in patients over 20 years of age), is one component of a comprehensive surveillance program.  Test performance has been validated by [facility] for patients greater than or equal to 63 year old. It is not intended to diagnose infection nor to guide or monitor treatment.   Fungus Culture With Stain (Not @ [facility])     Status: None (Preliminary result)   Collection Time: 11/06/15  2:33 PM  Result Value Ref Range Status   Fungus Stain Final report  Final    Comment: (NOTE) Performed At: [facility] [place], [place] [id number] Lindon Romp MD [id number]    Fungus (Mycology) Culture PENDING  Incomplete   Fungal Source WOUND  Final    Comment: LEFT TOE POF VANC AND ZOSYN   Acid Fast Smear (AFB)     Status: None   Collection Time: 11/06/15  2:33 PM  Result Value Ref Range Status   AFB Specimen Processing Concentration  Final   Acid Fast Smear Negative  Final    Comment: (NOTE) Performed At: [facility] [place], [place] [id number] Lindon Romp MD [id number]    Source (AFB) WOUND  Final    Comment: LEFT TOE POF VANC AND ZOSYN   Aerobic Culture (superficial specimen)     Status: None   Collection Time: 11/06/15  2:33 PM  Result Value Ref Range Status   Specimen Description WOUND LEFT TOE  Final   Special Requests GREAT TOE POF VANC AND ZOSYN  Final   Gram Stain   Final    NO WBC SEEN FEW SQUAMOUS EPITHELIAL CELLS PRESENT FEW GRAM POSITIVE COCCI IN CLUSTERS    Culture MULTIPLE ORGANISMS PRESENT, NONE PREDOMINANT  Final   Report Status 11/08/2015 FINAL  Final  Fungus Culture Result     Status: None   Collection Time: 11/06/15  2:33 PM  Result Value Ref Range Status   Result 1 Comment  Final    Comment: (NOTE) KOH/Calcofluor preparation:  no fungus observed. Performed At: [facility] [place],  [place] [id number] Lindon Romp MD [id number]   C difficile quick scan w PCR reflex     Status: None   Collection Time: 11/09/15  4:08 PM  Result Value Ref Range Status   C Diff antigen NEGATIVE NEGATIVE Final   C Diff toxin NEGATIVE NEGATIVE Final   C Diff interpretation No C. difficile detected.  Final  Surgical pcr screen     Status: Abnormal   Collection Time: 11/19/15  5:44 AM  Result Value Ref  Range Status   MRSA, PCR NEGATIVE NEGATIVE Final   Staphylococcus aureus POSITIVE (A) NEGATIVE Final    Comment:        The Xpert SA Assay (FDA approved for NASAL specimens in patients over 71 years of age), is one component of a comprehensive surveillance program.  Test performance has been validated by [facility] for patients greater than or equal to 34 year old. It is not intended to diagnose infection nor to guide or monitor treatment.     Coagulation Studies:  Recent Labs  11/21/15 0315 11/22/15 0419 11/23/15 0306  LABPROT 19.1* 20.7* 23.3*  INR 1.59 1.75 2.04    Urinalysis: No results for input(s): COLORURINE, LABSPEC, PHURINE, GLUCOSEU, HGBUR, BILIRUBINUR, KETONESUR, PROTEINUR, UROBILINOGEN, NITRITE, LEUKOCYTESUR in the last 72 hours.  Invalid input(s): APPERANCEUR    Imaging: No results found.   Medications:   . heparin 1,150 Units/hr (11/23/15 0500)   . allopurinol  150 mg Oral Daily  . aspirin  81 mg Oral Daily  . calcitRIOL  0.5 mcg Oral Daily  . docusate sodium  100 mg Oral Daily  . famotidine  20 mg Oral QHS  . feeding supplement  1 Container Oral TID WC  . hydrALAZINE  25 mg Oral Q8H  . insulin aspart  0-15 Units Subcutaneous TID WC  . insulin aspart  5 Units Subcutaneous TID WC  . insulin glargine  20 Units Subcutaneous QHS  . levothyroxine  137 mcg Oral QAC breakfast  . magnesium oxide  400 mg Oral BID  . metoprolol succinate  100 mg Oral Daily  . pantoprazole  40 mg Oral BID  . predniSONE  5 mg Oral Daily  . rosuvastatin  10 mg Oral  QPM  . saccharomyces boulardii  250 mg Oral BID  . tacrolimus  1 mg Oral BID  . tamsulosin  0.4 mg Oral QPC supper  . Warfarin - Pharmacist Dosing Inpatient   Does not apply q1800   alum & mag hydroxide-simeth, diphenoxylate-atropine, guaiFENesin-dextromethorphan, hydrALAZINE, HYDROcodone-acetaminophen, labetalol, levalbuterol, metoprolol, ondansetron (ZOFRAN) IV, oxyCODONE, phenol  Assessment/ Plan:  Renal transplant (2005) with AKI on CKD4 post contrast administration - Baseline creatinine 1.88-2 Timing of increased creatinine coincides with aortogram on August 1, however, atheroembolic event remains possible as well given his stuttering creatinine increases. It appears he received 60cc of contrast during procedure which is a moderate contrast load given his GFR - HD  Monday  Placed at [facility] - support with dialysis for now - can do a 3 month trial for AKI, hope for recovery and continue all anti-rejection meds, then assume ESRD if no improvement -  Placed Access 8/15   CKD-MBD  On calcitriol 0.33mcg daily, phosphorous 250mg  BID PTH on 7/25 was 82  HTN/Volume:  BP 120/70 controlled  Volume removal with HDwill continue   DM Per primary service  AFib: BB for rate control, on heparin, coumadin per [facility]  Gangrene/Cellulitis/Toe Amputation:  S/p atherectomy and DCB L popliteal art, and PTA left anterior tibial artery 11/05/2015.  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  [facility] following.  HLD  Hypothyroidism     LOS: 32 Addilyn Satterwhite W @TODAY @7 :09 AM

## 2015-11-23 NOTE — Progress Notes (Signed)
Patient ID: Mark [name], male   DOB: [birth year], 75 y.o.   MRN: [id number]                                                                PROGRESS NOTE                                                                                                                                                                                                             Patient Demographics:    Mark [name], is a 75 y.o. male, DOB - 10-05-1940, [id number]  Admit date - 10/28/2015   Admitting Physician Etta Quill, DO  Outpatient Primary MD for the patient is Rochel Brome, MD  LOS - 26  Outpatient Specialists:   Chief Complaint  Patient presents with  . 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 Patient presents to ED with c/o 'his toe is dead'. Creatinine baseline appears to be 1.8-2.3.  Primary nephrologist is Dr. Justin Mend.  Saw him on 06/26/2015 with BUN 27, creatinine 1.88.  Admitted on 10/28/2015 with necrotic left great toe with cellulitis.  Creatinine on admission was 2.6.  Seen by [name] in consult on 7/25.  His Prograf level was found to be low and was increased.  Patient underwent abdominal aortogram with angioplasty of the left anterior tibial artery and left popliteal artery on 11/05/2015.  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  He was on Vancomycin and Zosyn from 7/24-8/3.  Currently only on Zosyn.  He developed some dyspnea in the setting of being positive about 13 liters since admission with evidence of volume overload on exam. Worsening renal failure. Placement of temporary hemodialysis catheter on 8/10. Now started on HD. Will follow clinical response and renal function recovery.   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)   Cellulitis of  great toe of left foot   Aspiration of liquid   SOB (shortness of breath)   Permanent atrial fibrillation (HCC)   Bibasilar crackles   CHF (congestive heart failure) (HCC)   Melena   Renal failure   Acute on Chronic kidney disease stage V, status post renal transplant ,  -thought to be acute process caused by contrast , however, atheroembolic event remains possible as well given his stuttering creatinine increases - Management per nephrology, currently hemodialysis dependent, the plan is to do a 3 month trial for history of present illness, hope for recovery, meanwhile will continue with oral antirejection medication, and if no recovery in 3 months and can be resumed ESRD. - Displaced 8/15  Acute on chronic diastolic heart failure - No improvement with recess initially. - Currently on hemodialysis, volume management with dialysis.  Gangrene of left  Toe/Toe Amputation: -S /P atherectomy and DCB Left popliteal artery and PTA left ATA (POD 5) -S/P Ray amputation of left great toe (POD 4) -Palpable Left popliteal pulse; wound is healing properly -vascular surgery on board. -patient has completed antibiotic therapy  Urinary retention  - Bladder scan showing more than 600 mL , staff unable to do in and out , urology consulted . Foley catheter inserted on 8/16, Foley catheter discontinued 8/18, after closely for retention. - started on flomax  Anemia of chronic diseases,  -FOBT positive, but no signs of acute bleeding appreciated -with ongoing hemodilution from anasarca/fluid overload, Hgb has remained stable around 10.5 for several days.  -Continue PPI twice a day -follow Hgb trend   Diarrhea - C. difficile negative 2 during this admission - Resolved, actually now with constipation,  will start on stool softener.   A fib with aberrancy -  -continue b-blocker for rate controlled -on coumadin for anticoagulation (dose per pharmacy) -cardiology on board, will follow rec's -CHADsVASC score 4  DM2 -   -Hemoglobin A1c 8.2 -will continue SSI, meal coverage and lantus  Hypertension:  -Continue norvasc and metoprolol -Prn hydralazine also ordered  Hypothyroidism - continue Synthroid  Protein Calorie Malnutrition -will continue Prostat -last albumin 2.1 -nutritional service consulted, will follow rec's  Dyslipidemia -continue Crestor  Pulmonary hypertension:  Mr. [name] has moderate pulmonary hypertension.    -Cardiology Recommending outpatient sleep study and PFTs.   -He does have grade 2 diastolic dysfunction and will benefit of volume control -currently needing HD -will follow cardiology and renal service rec's  Code Status : FULL CODE  Family Communication  : none at bedside  Disposition Plan  :  ? Will likely need SNF at discharge; but before discharge he needs to be sitting was receiving hemodialysis.  Barriers For Discharge : worsening renal function, and the patient on dialysis now  Consults  :  Vascular surgery, nephrology, cardiology  Procedures  : Atherectomy and drug coated balloon angioplasty 8/1 , left ray amputation 8/2,  Right IJ tunneled dialysis catheter placement with US guidance,  leftbrachiocephalic arteriovenous fistula placement on 8/15  DVT Prophylaxis  :  Heparin   >> warfarin  Lab Results  Component Value Date   PLT 163 11/23/2015    Antibiotics  :     10/28/15 2200  piperacillin-tazobactam (ZOSYN) IVPB 3.375 g  Status:  Discontinued     3.375 g 12.5 mL/hr over 240 Minutes Intravenous Every 8 hours 10/28/15 2120 11/09/15 1057   10/28/15 2200  vancomycin (VANCOCIN) 2,500 mg in sodium chloride 0.9 % 500 mL IVPB     2,500 mg 250  mL/hr over 120 Minutes Intravenous  Once 10/28/15 2154 10/29/15 2330         Objective:   Vitals:   11/22/15 1335 11/22/15 1523 11/22/15 2133 11/23/15 0532  BP: (!) 120/55 (!) 134/39 131/67 128/64  Pulse: 82  91 92  Resp:   18 18  Temp:   98.4 F (36.9 C) 98.2 F (36.8 C)  TempSrc:   Oral Oral  SpO2:   99% 99%  Weight:    91.3 kg (201 lb 4.8 oz)  Height:        Wt Readings from Last 3 Encounters:  11/23/15 91.3 kg (201 lb 4.8 oz)  09/05/15 103.1 kg (227 lb 3.2 oz)  08/05/15 101.8 kg (224 lb 6.4 oz)     Intake/Output Summary (Last 24 hours) at 11/23/15 1126 Last data filed at 11/23/15 0800  Gross per 24 hour  Intake              440 ml  Output             4200 ml  Net            -3760 ml     Physical Exam General: AAOX3, in no acute distress. Head/eyes: [place]/AT,PERRLA, no icterus Neck:Supple Neck,  Lungs: Symmetrical Chest wall movement, Good air movement bilaterally, decrease breath sounds at bases, no wheezing  Heart: rate controlled. Positive A. Fib on telemetry, no rubs or gallops Abdomen: +ve B.Sounds, Abd Soft,Suprapubic tenderness,  No rebound - guarding or rigidity. Extremities: Diffuse anasarca, 2-3+ pitting edema throughout; left ray amputation, with clean dressing and just minimal serosanguineous drainage.   Data Review:    CBC  Recent Labs Lab 11/19/15 0027 11/20/15 0500 11/21/15 0315 11/22/15 0419 11/23/15 0306  WBC 6.9 8.1 7.0 6.7 5.3  HGB 10.7* 10.8* 10.4* 10.4* 10.8*  HCT 32.6* 33.8* 31.7* 32.0* 33.2*  PLT 170 166 155 164 163  MCV 98.5 96.6 97.2 97.6 97.9  MCH 32.3 30.9 31.9 31.7 31.9  MCHC 32.8 32.0 32.8 32.5 32.5  RDW 17.4* 17.6* 17.5* 17.2* 17.3*    Chemistries   Recent Labs Lab 11/17/15 1203 11/18/15 0350 11/19/15 0027 11/20/15 0730 11/22/15 0745  NA 129* 132* 132* 131* 132*  K 3.9 3.8 4.0 3.9 4.1  CL 97* 98* 100* 100* 96*  CO2 26 25 27 24 25   GLUCOSE 237* 109* 159* 104* 138*  BUN 34* 42* 22* 34* 32*  CREATININE 3.70* 4.05* 3.33* 4.38* 4.69*  CALCIUM 7.8* 8.0* 7.8* 8.3* 8.4*    ------------------------------------------------------------------------------------------------------------------ No results for input(s): CHOL, HDL, LDLCALC, TRIG, CHOLHDL, LDLDIRECT in the last 72 hours.  Lab Results  Component Value Date   HGBA1C 8.2 (H) 10/29/2015   ------------------------------------------------------------------------------------------------------------------ No results for input(s): TSH, T4TOTAL, T3FREE, THYROIDAB in the last 72 hours.  Invalid input(s): FREET3 ------------------------------------------------------------------------------------------------------------------ No results for input(s): VITAMINB12, FOLATE, FERRITIN, TIBC, IRON, RETICCTPCT in the last 72 hours.  Coagulation profile  Recent Labs Lab 11/19/15 0027 11/20/15 0500 11/21/15 0315 11/22/15 0419 11/23/15 0306  INR 1.75 1.69 1.59 1.75 2.04    Cardiac Enzymes No results for input(s): CKMB, TROPONINI, MYOGLOBIN in the last 168 hours.  Invalid input(s): CK ------------------------------------------------------------------------------------------------------------------ No results found for: BNP  Inpatient Medications  Scheduled Meds: . allopurinol  150 mg Oral Daily  . aspirin  81 mg Oral Daily  . calcitRIOL  0.5 mcg Oral Daily  . famotidine  20 mg Oral QHS  . feeding supplement  1 Container Oral TID WC  . hydrALAZINE  25 mg Oral Q8H  . insulin aspart  0-15 Units Subcutaneous TID WC  . insulin aspart  5 Units Subcutaneous TID WC  . insulin glargine  20 Units Subcutaneous QHS  . levothyroxine  137 mcg Oral QAC breakfast  . magnesium oxide  400 mg Oral BID  . metoprolol succinate  100 mg Oral Daily  . pantoprazole  40 mg Oral BID  . polyethylene glycol  34 g Oral BID  . predniSONE  5 mg Oral Daily  . rosuvastatin  10 mg Oral QPM  . saccharomyces boulardii  250 mg Oral BID  . senna-docusate  1 tablet Oral BID  . tacrolimus  1 mg Oral BID  . tamsulosin  0.4 mg Oral QPC  supper  . Warfarin - Pharmacist Dosing Inpatient   Does not apply q1800   Continuous Infusions:   PRN Meds:.alum & mag hydroxide-simeth, diphenoxylate-atropine, guaiFENesin-dextromethorphan, hydrALAZINE, HYDROcodone-acetaminophen, labetalol, levalbuterol, metoprolol, ondansetron (ZOFRAN) IV, oxyCODONE, phenol  Micro Results Recent Results (from the past 240 hour(s))  Surgical pcr screen     Status: Abnormal   Collection Time: 11/19/15  5:44 AM  Result Value Ref Range Status   MRSA, PCR NEGATIVE NEGATIVE Final   Staphylococcus aureus POSITIVE (A) NEGATIVE Final    Comment:        The Xpert SA Assay (FDA approved for NASAL specimens in patients over 61 years of age), is one component of a comprehensive surveillance program.  Test performance has been validated by [facility] for patients greater than or equal to 75 year old. It is not intended to diagnose infection nor to guide or monitor treatment.     Radiology Reports Ir Fluoro Guide Cv Line Right  Result Date: 11/14/2015 INDICATION: Acute kidney injury and needs hemodialysis. Plan for placement of non tunneled catheter due to elevated INR level. EXAM: FLUOROSCOPIC AND ULTRASOUND GUIDED PLACEMENT OF A NON TUNNELED DIALYSIS CATHETER Physician: Stephan Minister. Henn, MD MEDICATIONS: None ANESTHESIA/SEDATION: None FLUOROSCOPY TIME:  Fluoroscopy Time: 24 seconds COMPLICATIONS: None immediate. PROCEDURE: Informed consent was obtained for placement of a non tunneled dialysis catheter. The patient was placed supine on the interventional table. Ultrasound confirmed a patent right internal jugular vein. Ultrasound images were obtained for documentation. The right side of the neck was prepped and draped in a sterile fashion. The right neck was anesthetized with 1% lidocaine. Maximal barrier sterile technique was utilized including caps, mask, sterile gowns, sterile gloves, sterile drape, hand hygiene and skin antiseptic. A small incision was made with  #11 blade scalpel. A 21 gauge needle directed into the right internal jugular vein with ultrasound guidance. A micropuncture dilator set was placed. 20 cm Mahurkar catheter was selected. J wire was advanced into the IVC. Tract was dilated to accommodate the catheter. Catheter tip placed near the superior cavoatrial junction. All the lumens aspirated and flushed well. Appropriate amount of heparin was placed in the dialysis lumens. Catheter was sutured to the skin with Prolene suture. Fluoroscopic and ultrasound images were taken and saved for documentation. FINDINGS: Patent right internal jugular vein. Catheter tip near the superior cavoatrial junction. IMPRESSION: Successful placement of a non tunneled dialysis catheter using ultrasound and fluoroscopic guidance. Electronically Signed   By: Markus Daft M.D.   On: 11/14/2015 19:52   Ir US Guide Vasc Access Right  Result Date: 11/14/2015 INDICATION: Acute kidney injury and needs hemodialysis. Plan for placement of non tunneled catheter due to elevated INR level. EXAM: FLUOROSCOPIC AND ULTRASOUND GUIDED PLACEMENT OF A NON TUNNELED  DIALYSIS CATHETER Physician: Stephan Minister. Henn, MD MEDICATIONS: None ANESTHESIA/SEDATION: None FLUOROSCOPY TIME:  Fluoroscopy Time: 24 seconds COMPLICATIONS: None immediate. PROCEDURE: Informed consent was obtained for placement of a non tunneled dialysis catheter. The patient was placed supine on the interventional table. Ultrasound confirmed a patent right internal jugular vein. Ultrasound images were obtained for documentation. The right side of the neck was prepped and draped in a sterile fashion. The right neck was anesthetized with 1% lidocaine. Maximal barrier sterile technique was utilized including caps, mask, sterile gowns, sterile gloves, sterile drape, hand hygiene and skin antiseptic. A small incision was made with #11 blade scalpel. A 21 gauge needle directed into the right internal jugular vein with ultrasound guidance. A  micropuncture dilator set was placed. 20 cm Mahurkar catheter was selected. J wire was advanced into the IVC. Tract was dilated to accommodate the catheter. Catheter tip placed near the superior cavoatrial junction. All the lumens aspirated and flushed well. Appropriate amount of heparin was placed in the dialysis lumens. Catheter was sutured to the skin with Prolene suture. Fluoroscopic and ultrasound images were taken and saved for documentation. FINDINGS: Patent right internal jugular vein. Catheter tip near the superior cavoatrial junction. IMPRESSION: Successful placement of a non tunneled dialysis catheter using ultrasound and fluoroscopic guidance. Electronically Signed   By: Markus Daft M.D.   On: 11/14/2015 19:52   Dg Chest Port 1 View  Result Date: 11/19/2015 CLINICAL DATA:  As post dialysis catheter placement EXAM: PORTABLE CHEST 1 VIEW COMPARISON:  11/09/2015 FINDINGS: Previously seen temper a catheter is been removed in a new right jugular dialysis catheter placed in satisfactory position. Cardiac shadow is within normal limits. Vascular congestion is noted likely related to volume overload. No sizable effusion is seen. IMPRESSION: Vascular congestion likely related to volume overload. No pneumothorax is noted. Electronically Signed   By: Inez Catalina M.D.   On: 11/19/2015 10:03   Dg Chest Port 1 View  Result Date: 11/09/2015 CLINICAL DATA:  Shortness of breath EXAM: PORTABLE CHEST 1 VIEW COMPARISON:  November 06, 2015 FINDINGS: Mild cardiomegaly. The hila and mediastinum are unchanged. Diffuse bilateral interstitial opacities again identified and similar in the interval. The opacity is a little more focal in the left suprahilar region which is increased since November 05, 2015 but unchanged since November 06, 2015. No other acute interval changes. There may be tiny bilateral effusions with blunting of the costophrenic angles. IMPRESSION: 1. Suspected tiny bilateral pleural effusions and diffuse increased  interstitial markings are most consistent with pulmonary edema. Atypical viral infection could have a similar appearance. Recommend clinical correlation. *Opacity in the left suprahilar region is a little more focal when compared November 05, 2015. The difference could be due to confluence of shadows. Recommend follow-up to resolution. Electronically Signed   By: Dorise Bullion III M.D   On: 11/09/2015 09:14   Dg Chest Port 1 View  Result Date: 11/06/2015 CLINICAL DATA:  75 year old male with increasing shortness of breath and abnormal left side pulmonary auscultation. Initial encounter. EXAM: PORTABLE CHEST 1 VIEW COMPARISON:  0508 hours today and earlier. FINDINGS: Portable AP semi upright view at 2127 hours. Continued and mildly increased perihilar and basilar predominant interstitial pulmonary opacity. Stable cardiac size and mediastinal contours. No pneumothorax. No pleural effusion or consolidation identified. IMPRESSION: Increased bilateral pulmonary interstitial opacity. Top differential considerations include progression of interstitial edema versus viral/ atypical respiratory infection. No pleural effusion identified. Electronically Signed   By: Genevie Ann M.D.   On:  11/06/2015 21:42   Dg Chest Port 1 View  Result Date: 11/06/2015 CLINICAL DATA:  75 year old male with aspiration of liquid. EXAM: PORTABLE CHEST 1 VIEW COMPARISON:  Chest radiograph dated 11/05/2015 FINDINGS: Mild cardiomegaly with central vascular congestion. Bibasilar Interstitial prominence and nodular densities, new from prior study may represent atelectatic changes versus infiltrate. There is no pleural effusion or pneumothorax. Top-normal cardiac silhouette. No acute osseous pathology. IMPRESSION: Mild congestive changes with bibasilar subsegmental atelectasis versus infiltrate. Electronically Signed   By: Anner Crete M.D.   On: 11/06/2015 05:16   Dg Chest Port 1 View  Result Date: 11/05/2015 CLINICAL DATA:  Shortness of breath  EXAM: PORTABLE CHEST 1 VIEW COMPARISON:  March 14, 2015 FINDINGS: The lungs are clear. Heart is mildly enlarged with pulmonary vascularity within normal limits. No adenopathy. No bone lesions. IMPRESSION: Mild cardiac enlargement.  No edema or consolidation. Electronically Signed   By: Lowella Grip III M.D.   On: 11/05/2015 09:42   Dg Foot Complete Left  Result Date: 10/28/2015 CLINICAL DATA:  Left big toe pain. EXAM: LEFT FOOT - COMPLETE 3+ VIEW COMPARISON:  None. FINDINGS: There is no evidence of fracture or dislocation. No evidence of dystrophic bony changes. There is soft tissue swelling of the distal first digit. Punctate lucencies within the area of soft tissue swelling may represent entrapped fat or potentially foci of gas. Heavy vascular calcifications are seen. IMPRESSION: No evidence of fracture of the left foot. Soft tissue swelling of the distal first digit with punctate lucencies within the area of swelling, which may represent entrapped fat or potentially foci of gas. Please correlate clinically regarding the possibility of cellulitis. Electronically Signed   By: Fidela Salisbury M.D.   On: 10/28/2015 21:01  Dg Fluoro Guide Cv Line-no Report  Result Date: 11/19/2015 CLINICAL DATA:  FLOURO GUIDE CV LINE Fluoroscopy was utilized by the requesting physician.  No radiographic interpretation.      [facility], Eustace Hur M.D on 11/23/2015 at 11:26 AM [phone]

## 2015-11-23 NOTE — Progress Notes (Signed)
Pt stood and pivoted to chair and stayed 3 hours

## 2015-11-23 NOTE — Progress Notes (Signed)
[facility]  Subjective  - Doing well over all, no new complaints.   Objective 128/64 92 98.2 F (36.8 C) (Oral) 18 99%  Intake/Output Summary (Last 24 hours) at 11/23/15 0730 Last data filed at 11/22/15 1810  Gross per 24 hour  Intake              200 ml  Output             4200 ml  Net            -4000 ml   Left foot dressing clean and dry wet to dry dressing Left AV fistula palpable thrill Heart Irregular A fib Lungs non labored breathing   Assessment/Planning:  left BC AVF and insertion right IJ TDC  3 Day Post Op And Angioplasty left anterior tibial artery, atherectomy and drug coated balloon angioplasty, left popliteal artery POD 17, left 1st great toe amputation POD 15.   Cont current treatment -f/u with Dr. Hoyle Sauer 4weeks to check maturation of AVF & toe amputation site.   Laurence Slate [facility] 11/23/2015 7:30 AM --  Laboratory Lab Results:  Recent Labs  11/22/15 0419 11/23/15 0306  WBC 6.7 5.3  HGB 10.4* 10.8*  HCT 32.0* 33.2*  PLT 164 163   BMET  Recent Labs  11/22/15 0745  NA 132*  K 4.1  CL 96*  CO2 25  GLUCOSE 138*  BUN 32*  CREATININE 4.69*  CALCIUM 8.4*    COAG Lab Results  Component Value Date   INR 2.04 11/23/2015   INR 1.75 11/22/2015   INR 1.59 11/21/2015   No results found for: PTT

## 2015-11-23 NOTE — Clinical Social Work Note (Signed)
Clinical Social Worker continuing to follow patient and family for support and discharge planning needs.  Per chart, patient has been set up with [facility], M/W/F second shift.  Patient remains hopeful for placement at [facility] once medically stable.  CSW remains available for support and to facilitate patient discharge needs once medically ready.  Mark [name], [place]

## 2015-11-23 NOTE — Progress Notes (Signed)
ANTICOAGULATION CONSULT NOTE - Follow Up Consult  Pharmacy Consult for Coumadin Indication: afib   Allergies  Allergen Reactions  . Tape Other (See Comments)    SKIN WILL TEAR!!    Patient Measurements: Height: 5\' 9"  (175.3 cm) Weight: 201 lb 4.8 oz (91.3 kg) IBW/kg (Calculated) : 70.7  HDWt: 92kg  Vital Signs: Temp: 98.2 F (36.8 C) (08/19 0532) Temp Source: Oral (08/19 0532) BP: 128/64 (08/19 0532) Pulse Rate: 92 (08/19 0532)  Labs:  Recent Labs  11/21/15 0315 11/22/15 0419 11/22/15 0745 11/23/15 0306  HGB 10.4* 10.4*  --  10.8*  HCT 31.7* 32.0*  --  33.2*  PLT 155 164  --  163  LABPROT 19.1* 20.7*  --  23.3*  INR 1.59 1.75  --  2.04  HEPARINUNFRC 0.26* 0.33  --  0.32  CREATININE  --   --  4.69*  --     Estimated Creatinine Clearance: 15.2 mL/min (by C-G formula based on SCr of 4.69 mg/dL).   Assessment: 75 yo m presents after fall getting out of shower and primary MD sent pt to ED with diabetic foot infection. Toe found to be grossly infected and necrotic    He is on warfarin PTA for afib. S/P arteriogram 8/1 with successful arthrectomy and balloon angioplasty. INR 2.04 and heparin discontinued today  --Warfarin PTA dosing: 5mg  daily except 2.5mg  on Tuesdays and Thursdays.   Heme: H&H 10.4/32, Plt 164  Goal of Therapy:  INR 2-3 Monitor platelets by anticoagulation protocol: Yes    Plan:  -Warfarin 5mg  x 1 dose tonight -Daily INR  Hildred Laser, Pharm D 11/23/2015 12:35 PM

## 2015-11-24 LAB — GLUCOSE, CAPILLARY
GLUCOSE-CAPILLARY: 243 mg/dL — AB (ref 65–99)
Glucose-Capillary: 159 mg/dL — ABNORMAL HIGH (ref 65–99)
Glucose-Capillary: 237 mg/dL — ABNORMAL HIGH (ref 65–99)
Glucose-Capillary: 151 mg/dL — ABNORMAL HIGH (ref 65–99)

## 2015-11-24 LAB — PROTIME-INR
INR: 2.33
PROTHROMBIN TIME: 26 s — AB (ref 11.4–15.2)

## 2015-11-24 MED ORDER — WARFARIN SODIUM 5 MG PO TABS
5.0000 mg | ORAL_TABLET | Freq: Once | ORAL | Status: AC
  Administered 2015-11-24: 5 mg via ORAL
  Filled 2015-11-24: qty 1

## 2015-11-24 MED ORDER — TACROLIMUS 0.5 MG PO CAPS
0.5000 mg | ORAL_CAPSULE | Freq: Two times a day (BID) | ORAL | Status: DC
  Administered 2015-11-24 – 2015-11-26 (×5): 0.5 mg via ORAL
  Filled 2015-11-24 (×6): qty 1

## 2015-11-24 MED ORDER — INSULIN GLARGINE 100 UNIT/ML ~~LOC~~ SOLN
22.0000 [IU] | Freq: Every day | SUBCUTANEOUS | Status: DC
  Administered 2015-11-24 – 2015-11-25 (×2): 22 [IU] via SUBCUTANEOUS
  Filled 2015-11-24 (×3): qty 0.22

## 2015-11-24 MED ORDER — INSULIN GLARGINE 100 UNIT/ML ~~LOC~~ SOLN: 24.0000 [IU] | Freq: Every day | SUBCUTANEOUS | Status: DC

## 2015-11-24 NOTE — Clinical Social Work Note (Signed)
Clinical Social Worker continuing to follow patient and family for support and discharge planning needs.  Patient is making steady improvements and able to sit in the chair for three hours on 08/19.  Per RN, patient to sit for dialysis tomorrow morning and if successful plan to discharge Monday afternoon.  Patient has requested placement at [facility] left message with admission coordinator regarding potential placement.  CSW remains available for support and to facilitate patient discharge needs once medically stable.  Barbette Or, [place]

## 2015-11-24 NOTE — Progress Notes (Signed)
ANTICOAGULATION CONSULT NOTE - Follow Up Consult  Pharmacy Consult for Coumadin Indication: afib   Allergies  Allergen Reactions  . Tape Other (See Comments)    SKIN WILL TEAR!!    Patient Measurements: Height: 5\' 9"  (175.3 cm) Weight: 201 lb 4.8 oz (91.3 kg) IBW/kg (Calculated) : 70.7  HDWt: 92kg  Vital Signs: Temp: 97.8 F (36.6 C) (08/20 0612) Temp Source: Oral (08/20 0612) BP: 121/59 (08/20 0612) Pulse Rate: 64 (08/20 0612)  Labs:  Recent Labs  11/22/15 0419 11/22/15 0745 11/23/15 0306 11/24/15 0243  HGB 10.4*  --  10.8*  --   HCT 32.0*  --  33.2*  --   PLT 164  --  163  --   LABPROT 20.7*  --  23.3* 26.0*  INR 1.75  --  2.04 2.33  HEPARINUNFRC 0.33  --  0.32  --   CREATININE  --  4.69*  --   --     Estimated Creatinine Clearance: 15.2 mL/min (by C-G formula based on SCr of 4.69 mg/dL).   Assessment: 75 yo m presents after fall getting out of shower and primary MD sent pt to ED with diabetic foot infection. Toe found to be grossly infected and necrotic    He is on warfarin PTA for afib. S/P arteriogram 8/1 with successful arthrectomy and balloon angioplasty. INR 2.33 (trend up)  --Warfarin PTA dosing: 5mg  daily except 2.5mg  on Tuesdays and Thursdays.   Heme: H&H 10.4/32, Plt 164  Goal of Therapy:  INR 2-3 Monitor platelets by anticoagulation protocol: Yes    Plan:  -Warfarin 5mg  x 1 dose tonight -Daily INR  Hildred Laser, Pharm D 11/24/2015 11:22 AM

## 2015-11-24 NOTE — Progress Notes (Signed)
Patient ID: Mark [name], male   DOB: [birth year], 75 y.o.   MRN: [id number]                                                                PROGRESS NOTE                                                                                                                                                                                                             Patient Demographics:    Mark [name], is a 75 y.o. male, DOB - 10-11-1940, [id number]  Admit date - 10/28/2015   Admitting Physician Etta Quill, DO  Outpatient Primary MD for the patient is Rochel Brome, MD  LOS - 27  Outpatient Specialists:   Chief Complaint  Patient presents with  . 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 Patient presents to ED with c/o 'his toe is dead'. Creatinine baseline appears to be 1.8-2.3.  Primary nephrologist is Dr. Justin Mend.  Saw him on 06/26/2015 with BUN 27, creatinine 1.88.  Admitted on 10/28/2015 with necrotic left great toe with cellulitis.  Creatinine on admission was 2.6.  Seen by [name] in consult on 7/25.  His Prograf level was found to be low and was increased.  Patient underwent abdominal aortogram with angioplasty of the left anterior tibial artery and left popliteal artery on 11/05/2015.  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  He was on Vancomycin and Zosyn from 7/24-8/3.  Currently only on Zosyn.  He developed some dyspnea in the setting of being positive about 13 liters since admission with evidence of volume overload on exam. Worsening renal failure. Placement of temporary hemodialysis catheter on 8/10. Now started on HD. Will follow clinical response and renal function recovery.   Subjective:    Mark [name]  Afebrile.Denies any nausea or vomiting, Was able to sit in a recliner for 3 hours yesterday.  Assessment  & Plan :    Principal Problem:   Gangrene of toe (HCC) Active Problems:   Type II diabetes mellitus, uncontrolled ([place])   Renal transplant recipient   Diabetic ulcer of left great toe ([place])  Cellulitis of great toe of left foot   Aspiration of liquid   SOB (shortness of breath)   Permanent atrial fibrillation (HCC)   Bibasilar crackles   CHF (congestive heart failure) (HCC)   Melena   Renal failure   Acute on Chronic kidney disease stage V, status post renal transplant ,  -thought to be acute process caused by contrast , however, atheroembolic event remains possible as well given his stuttering creatinine increases - Management per nephrology, currently hemodialysis dependent, the plan is to do a 3 month trial for history of present illness, hope for recovery, meanwhile will continue with oral antirejection medication, and if no recovery in 3 months and can be resumed ESRD. - . Right IJ tunneled dialysis catheter placement with US guidance,  leftbrachiocephalic arteriovenous fistula placement placed on 8/15.  Acute on chronic diastolic heart failure - No improvement with recess initially. - Currently on hemodialysis, volume management with dialysis.  Gangrene of left  Toe/Toe Amputation: -S /P atherectomy and DCB Left popliteal artery and PTA left ATA (POD 5) -S/P Ray amputation of left great toe (POD 4) -Palpable Left popliteal pulse; wound is healing properly -vascular surgery on board. -patient has completed antibiotic therapy  Urinary retention  - Bladder scan showing more than 600 mL , staff unable to do in and out , urology consulted . Foley catheter inserted on 8/16, Foley catheter discontinued 8/18, after closely for retention. - started on flomax  Anemia of chronic diseases,  -FOBT positive, but no signs of acute bleeding appreciated -with ongoing hemodilution from anasarca/fluid overload, Hgb has remained stable around 10.5 for several days.  -Continue PPI twice a day -follow  Hgb trend   Diarrhea - C. difficile negative 2 during this admission - Resolved, actually now with constipation, will start on stool softener.   A fib with aberrancy -  -continue b-blocker for rate controlled -on coumadin for anticoagulation (dose per pharmacy) -cardiology on board, will follow rec's -CHADsVASC score 4  DM2 -   -Hemoglobin A1c 8.2 -will continue SSI, meal coverage and lantus  Hypertension:  -Continue norvasc and metoprolol -Prn hydralazine also ordered  Hypothyroidism - continue Synthroid  Protein Calorie Malnutrition -will continue Prostat -last albumin 2.1 -nutritional service consulted, will follow rec's  Dyslipidemia -continue Crestor  Pulmonary hypertension:  Mr. Spektor has moderate pulmonary hypertension.    -Cardiology Recommending outpatient sleep study and PFTs.   -He does have grade 2 diastolic dysfunction and will benefit of volume control -currently needing HD -will follow cardiology and renal service rec's  Code Status : FULL CODE  Family Communication  : Discussed with daughter via phone 8/20  Disposition Plan  :  For SNIF discharge, hopefully Monday or Tuesday,  but before discharge he needs to be sitting was receiving hemodialysis.  Barriers For Discharge : worsening renal function, and the patient on dialysis now  Consults  :  Vascular surgery, nephrology, cardiology  Procedures  : Atherectomy and drug coated balloon angioplasty 8/1 , left ray amputation 8/2,  Right IJ tunneled dialysis catheter placement with US guidance,  leftbrachiocephalic arteriovenous fistula placement on 8/15  DVT Prophylaxis  :  Heparin   >> warfarin  Lab Results  Component Value Date   PLT 163 11/23/2015    Antibiotics  :     10/28/15 2200  piperacillin-tazobactam (ZOSYN) IVPB 3.375 g  Status:  Discontinued     3.375 g 12.5 mL/hr over 240 Minutes Intravenous Every 8 hours 10/28/15 2120 11/09/15 1057  10/28/15 2200  vancomycin (VANCOCIN)  2,500 mg in sodium chloride 0.9 % 500 mL IVPB     2,500 mg 250 mL/hr over 120 Minutes Intravenous  Once 10/28/15 2154 10/29/15 2330        Objective:   Vitals:   11/23/15 1426 11/23/15 2030 11/24/15 0612 11/24/15 1208  BP: (!) 122/40 118/60 (!) 121/59 110/60  Pulse: 85 86 64 73  Resp: 19 18  20   Temp: 99.2 F (37.3 C) 98.1 F (36.7 C) 97.8 F (36.6 C) 98.9 F (37.2 C)  TempSrc: Oral Oral Oral Oral  SpO2: 100% 99% 100% 100%  Weight:      Height:        Wt Readings from Last 3 Encounters:  11/23/15 91.3 kg (201 lb 4.8 oz)  09/05/15 103.1 kg (227 lb 3.2 oz)  08/05/15 101.8 kg (224 lb 6.4 oz)     Intake/Output Summary (Last 24 hours) at 11/24/15 1232 Last data filed at 11/23/15 1300  Gross per 24 hour  Intake              240 ml  Output                0 ml  Net              240 ml     Physical Exam General: AAOX3, in no acute distress. Head/eyes: [place]/AT,PERRLA, no icterus Neck:Supple Neck,  Lungs: Symmetrical Chest wall movement, Good air movement bilaterally, decrease breath sounds at bases, no wheezing  Heart: rate controlled. Positive A. Fib on telemetry, no rubs or gallops Abdomen: +ve B.Sounds, Abd Soft,Suprapubic tenderness,  No rebound - guarding or rigidity. Extremities: Diffuse anasarca, 2-3+ pitting edema throughout; left ray amputation, with clean dressing and just minimal serosanguineous drainage.   Data Review:    CBC  Recent Labs Lab 11/19/15 0027 11/20/15 0500 11/21/15 0315 11/22/15 0419 11/23/15 0306  WBC 6.9 8.1 7.0 6.7 5.3  HGB 10.7* 10.8* 10.4* 10.4* 10.8*  HCT 32.6* 33.8* 31.7* 32.0* 33.2*  PLT 170 166 155 164 163  MCV 98.5 96.6 97.2 97.6 97.9  MCH 32.3 30.9 31.9 31.7 31.9  MCHC 32.8 32.0 32.8 32.5 32.5  RDW 17.4* 17.6* 17.5* 17.2* 17.3*    Chemistries   Recent Labs Lab 11/18/15 0350 11/19/15 0027 11/20/15 0730 11/22/15 0745  NA 132* 132* 131* 132*  K 3.8 4.0 3.9 4.1  CL 98* 100* 100* 96*  CO2 25 27 24 25   GLUCOSE 109*  159* 104* 138*  BUN 42* 22* 34* 32*  CREATININE 4.05* 3.33* 4.38* 4.69*  CALCIUM 8.0* 7.8* 8.3* 8.4*   ------------------------------------------------------------------------------------------------------------------ No results for input(s): CHOL, HDL, LDLCALC, TRIG, CHOLHDL, LDLDIRECT in the last 72 hours.  Lab Results  Component Value Date   HGBA1C 8.2 (H) 10/29/2015   ------------------------------------------------------------------------------------------------------------------ No results for input(s): TSH, T4TOTAL, T3FREE, THYROIDAB in the last 72 hours.  Invalid input(s): FREET3 ------------------------------------------------------------------------------------------------------------------ No results for input(s): VITAMINB12, FOLATE, FERRITIN, TIBC, IRON, RETICCTPCT in the last 72 hours.  Coagulation profile  Recent Labs Lab 11/20/15 0500 11/21/15 0315 11/22/15 0419 11/23/15 0306 11/24/15 0243  INR 1.69 1.59 1.75 2.04 2.33    Cardiac Enzymes No results for input(s): CKMB, TROPONINI, MYOGLOBIN in the last 168 hours.  Invalid input(s): CK ------------------------------------------------------------------------------------------------------------------ No results found for: BNP  Inpatient Medications  Scheduled Meds: . allopurinol  150 mg Oral Daily  . aspirin  81 mg Oral Daily  . calcitRIOL  0.5 mcg Oral Daily  . famotidine  20 mg Oral QHS  . feeding supplement  1 Container Oral TID WC  . hydrALAZINE  25 mg Oral Q8H  . insulin aspart  0-15 Units Subcutaneous TID WC  . insulin aspart  5 Units Subcutaneous TID WC  . insulin glargine  22 Units Subcutaneous QHS  . levothyroxine  137 mcg Oral QAC breakfast  . magnesium oxide  400 mg Oral BID  . metoprolol succinate  100 mg Oral Daily  . pantoprazole  40 mg Oral BID  . predniSONE  5 mg Oral Daily  . rosuvastatin  10 mg Oral QPM  . saccharomyces boulardii  250 mg Oral BID  . senna-docusate  1 tablet Oral BID   . tacrolimus  0.5 mg Oral BID  . tamsulosin  0.4 mg Oral QPC supper  . warfarin  5 mg Oral ONCE-1800  . Warfarin - Pharmacist Dosing Inpatient   Does not apply q1800   Continuous Infusions:   PRN Meds:.alum & mag hydroxide-simeth, diphenoxylate-atropine, guaiFENesin-dextromethorphan, hydrALAZINE, HYDROcodone-acetaminophen, labetalol, levalbuterol, metoprolol, ondansetron (ZOFRAN) IV, oxyCODONE, phenol  Micro Results Recent Results (from the past 240 hour(s))  Surgical pcr screen     Status: Abnormal   Collection Time: 11/19/15  5:44 AM  Result Value Ref Range Status   MRSA, PCR NEGATIVE NEGATIVE Final   Staphylococcus aureus POSITIVE (A) NEGATIVE Final    Comment:        The Xpert SA Assay (FDA approved for NASAL specimens in patients over 37 years of age), is one component of a comprehensive surveillance program.  Test performance has been validated by [facility] for patients greater than or equal to 6 year old. It is not intended to diagnose infection nor to guide or monitor treatment.     Radiology Reports Ir Fluoro Guide Cv Line Right  Result Date: 11/14/2015 INDICATION: Acute kidney injury and needs hemodialysis. Plan for placement of non tunneled catheter due to elevated INR level. EXAM: FLUOROSCOPIC AND ULTRASOUND GUIDED PLACEMENT OF A NON TUNNELED DIALYSIS CATHETER Physician: Stephan Minister. Henn, MD MEDICATIONS: None ANESTHESIA/SEDATION: None FLUOROSCOPY TIME:  Fluoroscopy Time: 24 seconds COMPLICATIONS: None immediate. PROCEDURE: Informed consent was obtained for placement of a non tunneled dialysis catheter. The patient was placed supine on the interventional table. Ultrasound confirmed a patent right internal jugular vein. Ultrasound images were obtained for documentation. The right side of the neck was prepped and draped in a sterile fashion. The right neck was anesthetized with 1% lidocaine. Maximal barrier sterile technique was utilized including caps, mask, sterile  gowns, sterile gloves, sterile drape, hand hygiene and skin antiseptic. A small incision was made with #11 blade scalpel. A 21 gauge needle directed into the right internal jugular vein with ultrasound guidance. A micropuncture dilator set was placed. 20 cm Mahurkar catheter was selected. J wire was advanced into the IVC. Tract was dilated to accommodate the catheter. Catheter tip placed near the superior cavoatrial junction. All the lumens aspirated and flushed well. Appropriate amount of heparin was placed in the dialysis lumens. Catheter was sutured to the skin with Prolene suture. Fluoroscopic and ultrasound images were taken and saved for documentation. FINDINGS: Patent right internal jugular vein. Catheter tip near the superior cavoatrial junction. IMPRESSION: Successful placement of a non tunneled dialysis catheter using ultrasound and fluoroscopic guidance. Electronically Signed   By: Markus Daft M.D.   On: 11/14/2015 19:52   Ir US Guide Vasc Access Right  Result Date: 11/14/2015 INDICATION: Acute kidney injury and needs hemodialysis. Plan for placement of non  tunneled catheter due to elevated INR level. EXAM: FLUOROSCOPIC AND ULTRASOUND GUIDED PLACEMENT OF A NON TUNNELED DIALYSIS CATHETER Physician: Stephan Minister. Henn, MD MEDICATIONS: None ANESTHESIA/SEDATION: None FLUOROSCOPY TIME:  Fluoroscopy Time: 24 seconds COMPLICATIONS: None immediate. PROCEDURE: Informed consent was obtained for placement of a non tunneled dialysis catheter. The patient was placed supine on the interventional table. Ultrasound confirmed a patent right internal jugular vein. Ultrasound images were obtained for documentation. The right side of the neck was prepped and draped in a sterile fashion. The right neck was anesthetized with 1% lidocaine. Maximal barrier sterile technique was utilized including caps, mask, sterile gowns, sterile gloves, sterile drape, hand hygiene and skin antiseptic. A small incision was made with #11 blade  scalpel. A 21 gauge needle directed into the right internal jugular vein with ultrasound guidance. A micropuncture dilator set was placed. 20 cm Mahurkar catheter was selected. J wire was advanced into the IVC. Tract was dilated to accommodate the catheter. Catheter tip placed near the superior cavoatrial junction. All the lumens aspirated and flushed well. Appropriate amount of heparin was placed in the dialysis lumens. Catheter was sutured to the skin with Prolene suture. Fluoroscopic and ultrasound images were taken and saved for documentation. FINDINGS: Patent right internal jugular vein. Catheter tip near the superior cavoatrial junction. IMPRESSION: Successful placement of a non tunneled dialysis catheter using ultrasound and fluoroscopic guidance. Electronically Signed   By: Markus Daft M.D.   On: 11/14/2015 19:52   Dg Chest Port 1 View  Result Date: 11/19/2015 CLINICAL DATA:  As post dialysis catheter placement EXAM: PORTABLE CHEST 1 VIEW COMPARISON:  11/09/2015 FINDINGS: Previously seen temper a catheter is been removed in a new right jugular dialysis catheter placed in satisfactory position. Cardiac shadow is within normal limits. Vascular congestion is noted likely related to volume overload. No sizable effusion is seen. IMPRESSION: Vascular congestion likely related to volume overload. No pneumothorax is noted. Electronically Signed   By: Inez Catalina M.D.   On: 11/19/2015 10:03   Dg Chest Port 1 View  Result Date: 11/09/2015 CLINICAL DATA:  Shortness of breath EXAM: PORTABLE CHEST 1 VIEW COMPARISON:  November 06, 2015 FINDINGS: Mild cardiomegaly. The hila and mediastinum are unchanged. Diffuse bilateral interstitial opacities again identified and similar in the interval. The opacity is a little more focal in the left suprahilar region which is increased since November 05, 2015 but unchanged since November 06, 2015. No other acute interval changes. There may be tiny bilateral effusions with blunting of the  costophrenic angles. IMPRESSION: 1. Suspected tiny bilateral pleural effusions and diffuse increased interstitial markings are most consistent with pulmonary edema. Atypical viral infection could have a similar appearance. Recommend clinical correlation. *Opacity in the left suprahilar region is a little more focal when compared November 05, 2015. The difference could be due to confluence of shadows. Recommend follow-up to resolution. Electronically Signed   By: Dorise Bullion III M.D   On: 11/09/2015 09:14   Dg Chest Port 1 View  Result Date: 11/06/2015 CLINICAL DATA:  75 year old male with increasing shortness of breath and abnormal left side pulmonary auscultation. Initial encounter. EXAM: PORTABLE CHEST 1 VIEW COMPARISON:  0508 hours today and earlier. FINDINGS: Portable AP semi upright view at 2127 hours. Continued and mildly increased perihilar and basilar predominant interstitial pulmonary opacity. Stable cardiac size and mediastinal contours. No pneumothorax. No pleural effusion or consolidation identified. IMPRESSION: Increased bilateral pulmonary interstitial opacity. Top differential considerations include progression of interstitial edema versus viral/ atypical respiratory  infection. No pleural effusion identified. Electronically Signed   By: Genevie Ann M.D.   On: 11/06/2015 21:42   Dg Chest Port 1 View  Result Date: 11/06/2015 CLINICAL DATA:  75 year old male with aspiration of liquid. EXAM: PORTABLE CHEST 1 VIEW COMPARISON:  Chest radiograph dated 11/05/2015 FINDINGS: Mild cardiomegaly with central vascular congestion. Bibasilar Interstitial prominence and nodular densities, new from prior study may represent atelectatic changes versus infiltrate. There is no pleural effusion or pneumothorax. Top-normal cardiac silhouette. No acute osseous pathology. IMPRESSION: Mild congestive changes with bibasilar subsegmental atelectasis versus infiltrate. Electronically Signed   By: Anner Crete M.D.   On:  11/06/2015 05:16   Dg Chest Port 1 View  Result Date: 11/05/2015 CLINICAL DATA:  Shortness of breath EXAM: PORTABLE CHEST 1 VIEW COMPARISON:  March 14, 2015 FINDINGS: The lungs are clear. Heart is mildly enlarged with pulmonary vascularity within normal limits. No adenopathy. No bone lesions. IMPRESSION: Mild cardiac enlargement.  No edema or consolidation. Electronically Signed   By: Lowella Grip III M.D.   On: 11/05/2015 09:42   Dg Foot Complete Left  Result Date: 10/28/2015 CLINICAL DATA:  Left big toe pain. EXAM: LEFT FOOT - COMPLETE 3+ VIEW COMPARISON:  None. FINDINGS: There is no evidence of fracture or dislocation. No evidence of dystrophic bony changes. There is soft tissue swelling of the distal first digit. Punctate lucencies within the area of soft tissue swelling may represent entrapped fat or potentially foci of gas. Heavy vascular calcifications are seen. IMPRESSION: No evidence of fracture of the left foot. Soft tissue swelling of the distal first digit with punctate lucencies within the area of swelling, which may represent entrapped fat or potentially foci of gas. Please correlate clinically regarding the possibility of cellulitis. Electronically Signed   By: Fidela Salisbury M.D.   On: 10/28/2015 21:01  Dg Fluoro Guide Cv Line-no Report  Result Date: 11/19/2015 CLINICAL DATA:  FLOURO GUIDE CV LINE Fluoroscopy was utilized by the requesting physician.  No radiographic interpretation.      [facility], Termaine Roupp M.D on 11/24/2015 at 12:32 PM [phone]

## 2015-11-24 NOTE — Progress Notes (Signed)
[facility] ROUNDING NOTE   Subjective:   Interval History: placement at [facility] very weak  Objective:  Vital signs in last 24 hours:  Temp:  [97.8 F (36.6 C)-99.2 F (37.3 C)] 97.8 F (36.6 C) (08/20 0612) Pulse Rate:  [64-86] 64 (08/20 0612) Resp:  [18-19] 18 (08/19 2030) BP: (118-122)/(40-60) 121/59 (08/20 0612) SpO2:  [99 %-100 %] 100 % (08/20 0612)  Weight change:  Filed Weights   11/22/15 0743 11/22/15 1138 11/23/15 0532  Weight: 96.6 kg (213 lb) 91.4 kg (201 lb 8 oz) 91.3 kg (201 lb 4.8 oz)    Intake/Output: I/O last 3 completed shifts: In: 480 [P.O.:480] Out: -    Intake/Output this shift:  No intake/output data recorded.  75 y.o. male. 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 Creatine baseline appears to be 1.8-2.0. Primary nephrologist is Dr. Justin Mend. Saw him on 06/26/2015 with BUN 27, creatinine 1.88. Admitted on 10/28/2015 with necrotic left great toe with cellulitis. Creatinine on admission was 2.6. Seen by [name] in consult on 7/25. Creatinine had been improving until last couple days when started to trend back up post aortogram and in setting of being up 13 liters since admission.  Alert oriented  CVS irregular irregular RS  Clear anterior lung fields good air entry AS soft obese non tender EXT s/p left fem pop and toe amputation    Basic Metabolic Panel:  Recent Labs Lab 11/17/15 1203 11/18/15 0350 11/19/15 0027 11/20/15 0730 11/22/15 0745  NA 129* 132* 132* 131* 132*  K 3.9 3.8 4.0 3.9 4.1  CL 97* 98* 100* 100* 96*  CO2 26 25 27 24 25   GLUCOSE 237* 109* 159* 104* 138*  BUN 34* 42* 22* 34* 32*  CREATININE 3.70* 4.05* 3.33* 4.38* 4.69*  CALCIUM 7.8* 8.0* 7.8* 8.3* 8.4*  PHOS 4.7* 5.0*  --  5.9* 5.0*    Liver Function Tests:  Recent Labs Lab 11/17/15 1203 11/18/15 0350 11/20/15 0730  11/22/15 0745  ALBUMIN 2.5* 2.3* 2.2* 2.2*   No results for input(s): LIPASE, AMYLASE in the last 168 hours. No results for input(s): AMMONIA in the last 168 hours.  CBC:  Recent Labs Lab 11/19/15 0027 11/20/15 0500 11/21/15 0315 11/22/15 0419 11/23/15 0306  WBC 6.9 8.1 7.0 6.7 5.3  HGB 10.7* 10.8* 10.4* 10.4* 10.8*  HCT 32.6* 33.8* 31.7* 32.0* 33.2*  MCV 98.5 96.6 97.2 97.6 97.9  PLT 170 166 155 164 163    Cardiac Enzymes: No results for input(s): CKTOTAL, CKMB, CKMBINDEX, TROPONINI in the last 168 hours.  BNP: Invalid input(s): POCBNP  CBG:  Recent Labs Lab 11/23/15 0619 11/23/15 1141 11/23/15 1722 11/23/15 2110 11/24/15 0714  GLUCAP 105* 166* 227* 219* 159*    Microbiology: Results for orders placed or performed during the hospital encounter of 10/28/15  Blood Cultures x 2 sites     Status: None   Collection Time: 10/28/15 10:00 PM  Result Value Ref Range Status   Specimen Description BLOOD RIGHT FOREARM  Final   Special Requests BOTTLES DRAWN AEROBIC AND ANAEROBIC 4CC  Final   Culture NO GROWTH 5 DAYS  Final   Report Status 11/02/2015 FINAL  Final  Blood Cultures x 2 sites     Status: None   Collection Time: 10/28/15 11:04 PM  Result Value Ref Range Status   Specimen Description BLOOD LEFT ARM  Final   Special Requests AEROBIC BOTTLE ONLY 7ML  Final   Culture NO GROWTH 5 DAYS  Final   Report Status 11/02/2015 FINAL  Final  C difficile quick scan w PCR reflex     Status: None   Collection Time: 11/04/15  9:42 AM  Result Value Ref Range Status   C Diff antigen NEGATIVE NEGATIVE Final   C Diff toxin NEGATIVE NEGATIVE Final   C Diff interpretation No C. difficile detected.  Final  Surgical PCR screen     Status: Abnormal   Collection Time: 11/05/15 11:18 PM  Result Value Ref Range Status   MRSA, PCR NEGATIVE NEGATIVE Final   Staphylococcus aureus POSITIVE (A) NEGATIVE Final    Comment:        The Xpert SA Assay (FDA approved for NASAL  specimens in patients over 71 years of age), is one component of a comprehensive surveillance program.  Test performance has been validated by [facility] for patients greater than or equal to 33 year old. It is not intended to diagnose infection nor to guide or monitor treatment.   Fungus Culture With Stain (Not @ [facility])     Status: None (Preliminary result)   Collection Time: 11/06/15  2:33 PM  Result Value Ref Range Status   Fungus Stain Final report  Final    Comment: (NOTE) Performed At: [facility] [place], [place] [id number] Lindon Romp MD [id number]    Fungus (Mycology) Culture PENDING  Incomplete   Fungal Source WOUND  Final    Comment: LEFT TOE POF VANC AND ZOSYN   Acid Fast Smear (AFB)     Status: None   Collection Time: 11/06/15  2:33 PM  Result Value Ref Range Status   AFB Specimen Processing Concentration  Final   Acid Fast Smear Negative  Final    Comment: (NOTE) Performed At: [facility] [place], [place] [id number] Lindon Romp MD [id number]    Source (AFB) WOUND  Final    Comment: LEFT TOE POF VANC AND ZOSYN   Aerobic Culture (superficial specimen)     Status: None   Collection Time: 11/06/15  2:33 PM  Result Value Ref Range Status   Specimen Description WOUND LEFT TOE  Final   Special Requests GREAT TOE POF VANC AND ZOSYN  Final   Gram Stain   Final    NO WBC SEEN FEW SQUAMOUS EPITHELIAL CELLS PRESENT FEW GRAM POSITIVE COCCI IN CLUSTERS    Culture MULTIPLE ORGANISMS PRESENT, NONE PREDOMINANT  Final   Report Status 11/08/2015 FINAL  Final  Fungus Culture Result     Status: None   Collection Time: 11/06/15  2:33 PM  Result Value Ref Range Status   Result 1 Comment  Final    Comment: (NOTE) KOH/Calcofluor preparation:  no fungus observed. Performed At: [facility] [place], [place] [id number] Lindon Romp MD [id number]   C difficile quick scan w  PCR reflex     Status: None   Collection Time: 11/09/15  4:08 PM  Result Value Ref Range Status   C Diff antigen NEGATIVE NEGATIVE Final   C Diff toxin NEGATIVE NEGATIVE Final   C Diff interpretation No C. difficile detected.  Final  Surgical pcr screen     Status: Abnormal   Collection Time: 11/19/15  5:44 AM  Result Value Ref Range Status   MRSA, PCR NEGATIVE NEGATIVE Final   Staphylococcus aureus POSITIVE (A) NEGATIVE Final  Comment:        The Xpert SA Assay (FDA approved for NASAL specimens in patients over 86 years of age), is one component of a comprehensive surveillance program.  Test performance has been validated by [facility] for patients greater than or equal to 36 year old. It is not intended to diagnose infection nor to guide or monitor treatment.     Coagulation Studies:  Recent Labs  11/22/15 0419 11/23/15 0306 11/24/15 0243  LABPROT 20.7* 23.3* 26.0*  INR 1.75 2.04 2.33    Urinalysis: No results for input(s): COLORURINE, LABSPEC, PHURINE, GLUCOSEU, HGBUR, BILIRUBINUR, KETONESUR, PROTEINUR, UROBILINOGEN, NITRITE, LEUKOCYTESUR in the last 72 hours.  Invalid input(s): APPERANCEUR    Imaging: No results found.   Medications:     . allopurinol  150 mg Oral Daily  . aspirin  81 mg Oral Daily  . calcitRIOL  0.5 mcg Oral Daily  . famotidine  20 mg Oral QHS  . feeding supplement  1 Container Oral TID WC  . hydrALAZINE  25 mg Oral Q8H  . insulin aspart  0-15 Units Subcutaneous TID WC  . insulin aspart  5 Units Subcutaneous TID WC  . insulin glargine  22 Units Subcutaneous QHS  . levothyroxine  137 mcg Oral QAC breakfast  . magnesium oxide  400 mg Oral BID  . metoprolol succinate  100 mg Oral Daily  . pantoprazole  40 mg Oral BID  . polyethylene glycol  34 g Oral BID  . predniSONE  5 mg Oral Daily  . rosuvastatin  10 mg Oral QPM  . saccharomyces boulardii  250 mg Oral BID  . senna-docusate  1 tablet Oral BID  . tacrolimus  1 mg Oral BID  .  tamsulosin  0.4 mg Oral QPC supper  . Warfarin - Pharmacist Dosing Inpatient   Does not apply q1800   alum & mag hydroxide-simeth, diphenoxylate-atropine, guaiFENesin-dextromethorphan, hydrALAZINE, HYDROcodone-acetaminophen, labetalol, levalbuterol, metoprolol, ondansetron (ZOFRAN) IV, oxyCODONE, phenol  Assessment/ Plan:  Renal transplant (2005) with AKI on CKD4 post contrast administration - Baseline creatinine 1.88-2 Timing of increased creatinine coincides with aortogram on August 1, however, atheroembolic event remains possible as well given his stuttering creatinine increases. It appears he received 60cc of contrast during procedure which is a moderate contrast load given his GFR - HD  Monday  Placed at [facility] - support with dialysis for now - can do a 3 month trial for AKI, hope for recovery and continue all anti-rejection meds, then assume ESRD if no improvement -  Placed Access 8/15   CKD-MBD  On calcitriol 0.61mcg daily, phosphorous 250mg  BID PTH on 7/25 was 82  HTN/Volume:  BP 120/70 controlled  Volume removal with HDwill continue   DM Per primary service  AFib: BB for rate control, on heparin, coumadin per [facility]  Gangrene/Cellulitis/Toe Amputation:  S/p atherectomy and DCB L popliteal art, and PTA left anterior tibial artery 11/05/2015.  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  [facility] following.  HLD  Hypothyroidism  Immunosuppression   Will reduce Tacrolimus  0.5mg  BID   LOS: 27 Rin Gorton W @TODAY @8 :08 AM

## 2015-11-25 LAB — PROTIME-INR
INR: 2.84
PROTHROMBIN TIME: 30.4 s — AB (ref 11.4–15.2)

## 2015-11-25 LAB — CBC
HEMATOCRIT: 34.6 % — AB (ref 39.0–52.0)
Hemoglobin: 10.9 g/dL — ABNORMAL LOW (ref 13.0–17.0)
MCH: 30.5 pg (ref 26.0–34.0)
MCHC: 31.5 g/dL (ref 30.0–36.0)
MCV: 96.9 fL (ref 78.0–100.0)
PLATELETS: 194 10*3/uL (ref 150–400)
RBC: 3.57 MIL/uL — AB (ref 4.22–5.81)
RDW: 16.7 % — AB (ref 11.5–15.5)
WBC: 5.8 10*3/uL (ref 4.0–10.5)

## 2015-11-25 LAB — RENAL FUNCTION PANEL
Albumin: 2.5 g/dL — ABNORMAL LOW (ref 3.5–5.0)
Anion gap: 10 (ref 5–15)
BUN: 42 mg/dL — AB (ref 6–20)
CHLORIDE: 98 mmol/L — AB (ref 101–111)
CO2: 26 mmol/L (ref 22–32)
CREATININE: 5.75 mg/dL — AB (ref 0.61–1.24)
Calcium: 8.8 mg/dL — ABNORMAL LOW (ref 8.9–10.3)
GFR calc Af Amer: 10 mL/min — ABNORMAL LOW (ref >60)
GFR, EST NON AFRICAN AMERICAN: 9 mL/min — AB (ref >60)
Glucose, Bld: 131 mg/dL — ABNORMAL HIGH (ref 65–99)
POTASSIUM: 4.1 mmol/L (ref 3.5–5.1)
Phosphorus: 5.5 mg/dL — ABNORMAL HIGH (ref 2.5–4.6)
Sodium: 134 mmol/L — ABNORMAL LOW (ref 135–145)

## 2015-11-25 LAB — MAGNESIUM: Magnesium: 3.3 mg/dL — ABNORMAL HIGH (ref 1.7–2.4)

## 2015-11-25 LAB — GLUCOSE, CAPILLARY
GLUCOSE-CAPILLARY: 184 mg/dL — AB (ref 65–99)
GLUCOSE-CAPILLARY: 193 mg/dL — AB (ref 65–99)
Glucose-Capillary: 137 mg/dL — ABNORMAL HIGH (ref 65–99)
Glucose-Capillary: 122 mg/dL — ABNORMAL HIGH (ref 65–99)

## 2015-11-25 MED ORDER — LIDOCAINE HCL (PF) 1 % IJ SOLN: 5.0000 mL | INTRAMUSCULAR | Status: DC | PRN

## 2015-11-25 MED ORDER — PENTAFLUOROPROP-TETRAFLUOROETH EX AERO: 1.0000 "application " | INHALATION_SPRAY | CUTANEOUS | Status: DC | PRN

## 2015-11-25 MED ORDER — WARFARIN SODIUM 2.5 MG PO TABS
2.5000 mg | ORAL_TABLET | Freq: Once | ORAL | Status: AC
  Administered 2015-11-25: 2.5 mg via ORAL
  Filled 2015-11-25 (×2): qty 1

## 2015-11-25 MED ORDER — CALCITRIOL 0.5 MCG PO CAPS
ORAL_CAPSULE | ORAL | Status: AC
  Filled 2015-11-25: qty 1

## 2015-11-25 MED ORDER — CALCITRIOL 0.5 MCG PO CAPS
0.5000 ug | ORAL_CAPSULE | ORAL | Status: DC
  Administered 2015-11-25: 0.5 ug via ORAL

## 2015-11-25 MED ORDER — ALTEPLASE 2 MG IJ SOLR: 2.0000 mg | Freq: Once | INTRAMUSCULAR | Status: DC | PRN

## 2015-11-25 MED ORDER — METOPROLOL SUCCINATE ER 50 MG PO TB24
75.0000 mg | ORAL_TABLET | Freq: Every day | ORAL | Status: DC
  Administered 2015-11-26: 75 mg via ORAL
  Filled 2015-11-25: qty 1

## 2015-11-25 MED ORDER — RENA-VITE PO TABS
1.0000 | ORAL_TABLET | Freq: Every day | ORAL | Status: DC
Start: 2015-11-25 — End: 2015-11-26
  Administered 2015-11-25: 1 via ORAL
  Filled 2015-11-25: qty 1

## 2015-11-25 MED ORDER — SODIUM CHLORIDE 0.9 % IV SOLN: 100.0000 mL | INTRAVENOUS | Status: DC | PRN

## 2015-11-25 MED ORDER — LIDOCAINE-PRILOCAINE 2.5-2.5 % EX CREA: 1.0000 "application " | TOPICAL_CREAM | CUTANEOUS | Status: DC | PRN

## 2015-11-25 MED ORDER — HEPARIN SODIUM (PORCINE) 1000 UNIT/ML DIALYSIS: 1000.0000 [IU] | INTRAMUSCULAR | Status: DC | PRN

## 2015-11-25 NOTE — Progress Notes (Signed)
ANTICOAGULATION CONSULT NOTE - Follow Up Consult  Pharmacy Consult for Coumadin Indication: afib   Allergies  Allergen Reactions  . Tape Other (See Comments)    SKIN WILL TEAR!!    Patient Measurements: Height: 5\' 9"  (175.3 cm) Weight: 205 lb 7.5 oz (93.2 kg) (standing weight) IBW/kg (Calculated) : 70.7  HDWt: 92kg  Vital Signs: Temp: 97.9 F (36.6 C) (08/21 0721) Temp Source: Oral (08/21 0721) BP: 125/52 (08/21 0830) Pulse Rate: 64 (08/21 0830)  Labs:  Recent Labs  11/23/15 0306 11/24/15 0243 11/25/15 0323 11/25/15 0745  HGB 10.8*  --   --  10.9*  HCT 33.2*  --   --  34.6*  PLT 163  --   --  194  LABPROT 23.3* 26.0* 30.4*  --   INR 2.04 2.33 2.84  --   HEPARINUNFRC 0.32  --   --   --   CREATININE  --   --   --  5.75*    Estimated Creatinine Clearance: 12.5 mL/min (by C-G formula based on SCr of 5.75 mg/dL).   Assessment: 54 yoM on warfarin PTA for afib. INR 2.84 < 2.33 over last 24 hours. CBC stable.   --Warfarin PTA dosing: 5mg  daily except 2.5mg  on Tuesdays and Thursdays.   Heme: H&H 10.9/34.6, Plt 194  Goal of Therapy:  INR 2-3 Monitor platelets by anticoagulation protocol: Yes    Plan:  -Reduce Warfarin dose to 2.5mg  x 1 dose tonight -Daily INR  Vincenza Hews, PharmD, BCPS 11/25/2015, 8:46 AM Pager: [phone]

## 2015-11-25 NOTE — Progress Notes (Signed)
Patient ID: BRIGGSTON STIGGERS, male   DOB: 1940-08-20, 75 y.o.   MRN: [id number]                                                                PROGRESS NOTE                                                                                                                                                                                                             Patient Demographics:    Mark [name], is a 75 y.o. male, DOB - July 03, 1940, [id number]  Admit date - 10/28/2015   Admitting Physician Etta Quill, DO  Outpatient Primary MD for the patient is Rochel Brome, MD  LOS - 28  Outpatient Specialists:   Chief Complaint  Patient presents with  . 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 Patient presents to ED with c/o 'his toe is dead'. Creatinine baseline appears to be 1.8-2.3.  Primary nephrologist is Dr. Justin Mend.  Saw him on 06/26/2015 with BUN 27, creatinine 1.88.  Admitted on 10/28/2015 with necrotic left great toe with cellulitis.  Creatinine on admission was 2.6.  Seen by [name] in consult on 7/25.  His Prograf level was found to be low and was increased.  Patient underwent abdominal aortogram with angioplasty of the left anterior tibial artery and left popliteal artery on 11/05/2015.  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  He was on Vancomycin and Zosyn from 7/24-8/3.  Currently only on Zosyn.  He developed some dyspnea in the setting of being positive about 13 liters since admission with evidence of volume overload on exam. Worsening renal failure. Placement of temporary hemodialysis catheter on 8/10. Now started on HD. Will follow clinical response and renal function recovery.   Subjective:    Mark [name]  Afebrile.Denies any nausea or vomiting, did sit during hemodialysis session today.  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)   Cellulitis of great toe  of left foot   Aspiration of liquid   SOB (shortness of breath)   Permanent atrial fibrillation (HCC)   Bibasilar crackles   CHF (congestive heart failure) (HCC)   Melena   Renal failure   Acute on Chronic kidney disease stage V, status post renal transplant ,  -thought to be acute process caused by contrast , however, atheroembolic event remains possible as well given his stuttering creatinine increases - Management per nephrology, currently hemodialysis dependent, the plan is to do a 3 month trial for history of present illness, hope for recovery, meanwhile will continue with oral antirejection medication, and if no recovery in 3 months and can be resumed ESRD. - . Right IJ tunneled dialysis catheter placement with US guidance,  leftbrachiocephalic arteriovenous fistula placement placed on 8/15.  Acute on chronic diastolic heart failure - No improvement with recess initially. - Currently on hemodialysis, volume management with dialysis.  Gangrene of left  Toe/Toe Amputation: -S /P atherectomy and DCB Left popliteal artery and PTA left ATA (POD 5) -S/P Ray amputation of left great toe (POD 4) -Palpable Left popliteal pulse; wound is healing properly -vascular surgery on board. -patient has completed antibiotic therapy  Urinary retention  - Bladder scan showing more than 600 mL , staff unable to do in and out , urology consulted . Foley catheter inserted on 8/16, Foley catheter discontinued 8/18, after closely for retention. - started on flomax  Anemia of chronic diseases,  -FOBT positive, but no signs of acute bleeding appreciated -with ongoing hemodilution from anasarca/fluid overload, Hgb has remained stable around 10.5 for several days.  -Continue PPI twice a day -follow Hgb trend     Diarrhea - C. difficile negative 2 during this admission - Resolved, actually now with constipation, will start on stool softener.   A fib with aberrancy -  -continue b-blocker for rate controlled -on coumadin for anticoagulation (dose per pharmacy) -cardiology on board, will follow rec's -CHADsVASC score 4  DM2 -   -Hemoglobin A1c 8.2 -will continue SSI, meal coverage and lantus  Hypertension:  - Blood pressure on the lower side today, hypotensive after hemodialysis, will discontinue oral hydralazine, will decrease metoprolol XL from 100-75 mg oral daily  Hypothyroidism - continue Synthroid  Protein Calorie Malnutrition -will continue Prostat -last albumin 2.1 -nutritional service consulted, will follow rec's  Dyslipidemia -continue Crestor  Pulmonary hypertension:  Mr. Enfinger has moderate pulmonary hypertension.    -Cardiology Recommending outpatient sleep study and PFTs.   -He does have grade 2 diastolic dysfunction and will benefit of volume control -currently needing HD -will follow cardiology and renal service rec's  Code Status : FULL CODE  Family Communication  : Discussed with daughter via phone 8/20  Disposition Plan  :  For SNF discharge, or fluid tomorrow if blood pressure stable .  Consults  :  Vascular surgery, nephrology, cardiology  Procedures  : Atherectomy and drug coated balloon angioplasty 8/1 , left ray amputation 8/2,  Right IJ tunneled dialysis catheter placement with US guidance,  leftbrachiocephalic arteriovenous fistula placement on 8/15  DVT Prophylaxis  :  warfarin  Lab Results  Component Value Date   PLT 194 11/25/2015    Antibiotics  :     10/28/15 2200  piperacillin-tazobactam (ZOSYN) IVPB 3.375 g  Status:  Discontinued     3.375 g 12.5 mL/hr over 240 Minutes Intravenous Every 8 hours 10/28/15 2120 11/09/15 1057   10/28/15 2200  vancomycin (VANCOCIN) 2,500 mg in sodium chloride 0.9 % 500  mL IVPB     2,500 mg 250  mL/hr over 120 Minutes Intravenous  Once 10/28/15 2154 10/29/15 2330        Objective:   Vitals:   11/25/15 0930 11/25/15 1000 11/25/15 1030 11/25/15 1100  BP: (!) 109/54 (!) 122/50 (!) 138/43 (!) 89/67  Pulse: 88 71 78 81  Resp: 15 20 (!) 22 (!) 25  Temp:      TempSrc:      SpO2:      Weight:      Height:        Wt Readings from Last 3 Encounters:  11/25/15 93.2 kg (205 lb 7.5 oz)  09/05/15 103.1 kg (227 lb 3.2 oz)  08/05/15 101.8 kg (224 lb 6.4 oz)     Intake/Output Summary (Last 24 hours) at 11/25/15 1236 Last data filed at 11/25/15 0310  Gross per 24 hour  Intake              240 ml  Output              250 ml  Net              -10 ml     Physical Exam General: AAOX3, in no acute distress, sitting in a recliner in hemodialysis Head/eyes: [place]/AT,PERRLA, no icterus Neck:Supple Neck,  Lungs: Symmetrical Chest wall movement, Good air movement bilaterally, decrease breath sounds at bases, no wheezing  Heart: rate controlled. Positive A. Fib on telemetry, no rubs or gallops Abdomen: +ve B.Sounds, Abd Soft,Suprapubic tenderness,  No rebound - guarding or rigidity. Extremities: Diffuse anasarca, Mild pitting edema throughout; left ray amputation, with clean dressing and just minimal serosanguineous drainage.   Data Review:    CBC  Recent Labs Lab 11/20/15 0500 11/21/15 0315 11/22/15 0419 11/23/15 0306 11/25/15 0745  WBC 8.1 7.0 6.7 5.3 5.8  HGB 10.8* 10.4* 10.4* 10.8* 10.9*  HCT 33.8* 31.7* 32.0* 33.2* 34.6*  PLT 166 155 164 163 194  MCV 96.6 97.2 97.6 97.9 96.9  MCH 30.9 31.9 31.7 31.9 30.5  MCHC 32.0 32.8 32.5 32.5 31.5  RDW 17.6* 17.5* 17.2* 17.3* 16.7*    Chemistries   Recent Labs Lab 11/19/15 0027 11/20/15 0730 11/22/15 0745 11/25/15 0745  NA 132* 131* 132* 134*  K 4.0 3.9 4.1 4.1  CL 100* 100* 96* 98*  CO2 27 24 25 26   GLUCOSE 159* 104* 138* 131*  BUN 22* 34* 32* 42*  CREATININE 3.33* 4.38* 4.69* 5.75*  CALCIUM 7.8* 8.3* 8.4* 8.8*    ------------------------------------------------------------------------------------------------------------------ No results for input(s): CHOL, HDL, LDLCALC, TRIG, CHOLHDL, LDLDIRECT in the last 72 hours.  Lab Results  Component Value Date   HGBA1C 8.2 (H) 10/29/2015   ------------------------------------------------------------------------------------------------------------------ No results for input(s): TSH, T4TOTAL, T3FREE, THYROIDAB in the last 72 hours.  Invalid input(s): FREET3 ------------------------------------------------------------------------------------------------------------------ No results for input(s): VITAMINB12, FOLATE, FERRITIN, TIBC, IRON, RETICCTPCT in the last 72 hours.  Coagulation profile  Recent Labs Lab 11/21/15 0315 11/22/15 0419 11/23/15 0306 11/24/15 0243 11/25/15 0323  INR 1.59 1.75 2.04 2.33 2.84    Cardiac Enzymes No results for input(s): CKMB, TROPONINI, MYOGLOBIN in the last 168 hours.  Invalid input(s): CK ------------------------------------------------------------------------------------------------------------------ No results found for: BNP  Inpatient Medications  Scheduled Meds: . allopurinol  150 mg Oral Daily  . aspirin  81 mg Oral Daily  . calcitRIOL      . calcitRIOL  0.5 mcg Oral Q M,W,F-HD  . famotidine  20 mg Oral QHS  . feeding supplement  1 Container Oral TID WC  . insulin aspart  0-15 Units Subcutaneous TID WC  . insulin aspart  5 Units Subcutaneous TID WC  . insulin glargine  22 Units Subcutaneous QHS  . levothyroxine  137 mcg Oral QAC breakfast  . [START ON 11/26/2015] metoprolol succinate  75 mg Oral Daily  . multivitamin  1 tablet Oral QHS  . pantoprazole  40 mg Oral BID  . predniSONE  5 mg Oral Daily  . rosuvastatin  10 mg Oral QPM  . saccharomyces boulardii  250 mg Oral BID  . senna-docusate  1 tablet Oral BID  . tacrolimus  0.5 mg Oral BID  . tamsulosin  0.4 mg Oral QPC supper  . warfarin  2.5 mg  Oral ONCE-1800  . Warfarin - Pharmacist Dosing Inpatient   Does not apply q1800   Continuous Infusions:   PRN Meds:.alum & mag hydroxide-simeth, diphenoxylate-atropine, guaiFENesin-dextromethorphan, hydrALAZINE, HYDROcodone-acetaminophen, labetalol, levalbuterol, metoprolol, ondansetron (ZOFRAN) IV, oxyCODONE, phenol  Micro Results Recent Results (from the past 240 hour(s))  Surgical pcr screen     Status: Abnormal   Collection Time: 11/19/15  5:44 AM  Result Value Ref Range Status   MRSA, PCR NEGATIVE NEGATIVE Final   Staphylococcus aureus POSITIVE (A) NEGATIVE Final    Comment:        The Xpert SA Assay (FDA approved for NASAL specimens in patients over 71 years of age), is one component of a comprehensive surveillance program.  Test performance has been validated by [facility] for patients greater than or equal to 31 year old. It is not intended to diagnose infection nor to guide or monitor treatment.     Radiology Reports Ir Fluoro Guide Cv Line Right  Result Date: 11/14/2015 INDICATION: Acute kidney injury and needs hemodialysis. Plan for placement of non tunneled catheter due to elevated INR level. EXAM: FLUOROSCOPIC AND ULTRASOUND GUIDED PLACEMENT OF A NON TUNNELED DIALYSIS CATHETER Physician: Stephan Minister. Henn, MD MEDICATIONS: None ANESTHESIA/SEDATION: None FLUOROSCOPY TIME:  Fluoroscopy Time: 24 seconds COMPLICATIONS: None immediate. PROCEDURE: Informed consent was obtained for placement of a non tunneled dialysis catheter. The patient was placed supine on the interventional table. Ultrasound confirmed a patent right internal jugular vein. Ultrasound images were obtained for documentation. The right side of the neck was prepped and draped in a sterile fashion. The right neck was anesthetized with 1% lidocaine. Maximal barrier sterile technique was utilized including caps, mask, sterile gowns, sterile gloves, sterile drape, hand hygiene and skin antiseptic. A small incision was  made with #11 blade scalpel. A 21 gauge needle directed into the right internal jugular vein with ultrasound guidance. A micropuncture dilator set was placed. 20 cm Mahurkar catheter was selected. J wire was advanced into the IVC. Tract was dilated to accommodate the catheter. Catheter tip placed near the superior cavoatrial junction. All the lumens aspirated and flushed well. Appropriate amount of heparin was placed in the dialysis lumens. Catheter was sutured to the skin with Prolene suture. Fluoroscopic and ultrasound images were taken and saved for documentation. FINDINGS: Patent right internal jugular vein. Catheter tip near the superior cavoatrial junction. IMPRESSION: Successful placement of a non tunneled dialysis catheter using ultrasound and fluoroscopic guidance. Electronically Signed   By: Markus Daft M.D.   On: 11/14/2015 19:52   Ir US Guide Vasc Access Right  Result Date: 11/14/2015 INDICATION: Acute kidney injury and needs hemodialysis. Plan for placement of non tunneled catheter due to elevated INR level. EXAM: FLUOROSCOPIC AND ULTRASOUND GUIDED PLACEMENT OF A  NON TUNNELED DIALYSIS CATHETER Physician: Stephan Minister. Henn, MD MEDICATIONS: None ANESTHESIA/SEDATION: None FLUOROSCOPY TIME:  Fluoroscopy Time: 24 seconds COMPLICATIONS: None immediate. PROCEDURE: Informed consent was obtained for placement of a non tunneled dialysis catheter. The patient was placed supine on the interventional table. Ultrasound confirmed a patent right internal jugular vein. Ultrasound images were obtained for documentation. The right side of the neck was prepped and draped in a sterile fashion. The right neck was anesthetized with 1% lidocaine. Maximal barrier sterile technique was utilized including caps, mask, sterile gowns, sterile gloves, sterile drape, hand hygiene and skin antiseptic. A small incision was made with #11 blade scalpel. A 21 gauge needle directed into the right internal jugular vein with ultrasound  guidance. A micropuncture dilator set was placed. 20 cm Mahurkar catheter was selected. J wire was advanced into the IVC. Tract was dilated to accommodate the catheter. Catheter tip placed near the superior cavoatrial junction. All the lumens aspirated and flushed well. Appropriate amount of heparin was placed in the dialysis lumens. Catheter was sutured to the skin with Prolene suture. Fluoroscopic and ultrasound images were taken and saved for documentation. FINDINGS: Patent right internal jugular vein. Catheter tip near the superior cavoatrial junction. IMPRESSION: Successful placement of a non tunneled dialysis catheter using ultrasound and fluoroscopic guidance. Electronically Signed   By: Markus Daft M.D.   On: 11/14/2015 19:52   Dg Chest Port 1 View  Result Date: 11/19/2015 CLINICAL DATA:  As post dialysis catheter placement EXAM: PORTABLE CHEST 1 VIEW COMPARISON:  11/09/2015 FINDINGS: Previously seen temper a catheter is been removed in a new right jugular dialysis catheter placed in satisfactory position. Cardiac shadow is within normal limits. Vascular congestion is noted likely related to volume overload. No sizable effusion is seen. IMPRESSION: Vascular congestion likely related to volume overload. No pneumothorax is noted. Electronically Signed   By: Inez Catalina M.D.   On: 11/19/2015 10:03   Dg Chest Port 1 View  Result Date: 11/09/2015 CLINICAL DATA:  Shortness of breath EXAM: PORTABLE CHEST 1 VIEW COMPARISON:  November 06, 2015 FINDINGS: Mild cardiomegaly. The hila and mediastinum are unchanged. Diffuse bilateral interstitial opacities again identified and similar in the interval. The opacity is a little more focal in the left suprahilar region which is increased since November 05, 2015 but unchanged since November 06, 2015. No other acute interval changes. There may be tiny bilateral effusions with blunting of the costophrenic angles. IMPRESSION: 1. Suspected tiny bilateral pleural effusions and  diffuse increased interstitial markings are most consistent with pulmonary edema. Atypical viral infection could have a similar appearance. Recommend clinical correlation. *Opacity in the left suprahilar region is a little more focal when compared November 05, 2015. The difference could be due to confluence of shadows. Recommend follow-up to resolution. Electronically Signed   By: Dorise Bullion III M.D   On: 11/09/2015 09:14   Dg Chest Port 1 View  Result Date: 11/06/2015 CLINICAL DATA:  75 year old male with increasing shortness of breath and abnormal left side pulmonary auscultation. Initial encounter. EXAM: PORTABLE CHEST 1 VIEW COMPARISON:  0508 hours today and earlier. FINDINGS: Portable AP semi upright view at 2127 hours. Continued and mildly increased perihilar and basilar predominant interstitial pulmonary opacity. Stable cardiac size and mediastinal contours. No pneumothorax. No pleural effusion or consolidation identified. IMPRESSION: Increased bilateral pulmonary interstitial opacity. Top differential considerations include progression of interstitial edema versus viral/ atypical respiratory infection. No pleural effusion identified. Electronically Signed   By: Herminio Heads.D.  On: 11/06/2015 21:42   Dg Chest Port 1 View  Result Date: 11/06/2015 CLINICAL DATA:  75 year old male with aspiration of liquid. EXAM: PORTABLE CHEST 1 VIEW COMPARISON:  Chest radiograph dated 11/05/2015 FINDINGS: Mild cardiomegaly with central vascular congestion. Bibasilar Interstitial prominence and nodular densities, new from prior study may represent atelectatic changes versus infiltrate. There is no pleural effusion or pneumothorax. Top-normal cardiac silhouette. No acute osseous pathology. IMPRESSION: Mild congestive changes with bibasilar subsegmental atelectasis versus infiltrate. Electronically Signed   By: Anner Crete M.D.   On: 11/06/2015 05:16   Dg Chest Port 1 View  Result Date: 11/05/2015 CLINICAL DATA:   Shortness of breath EXAM: PORTABLE CHEST 1 VIEW COMPARISON:  March 14, 2015 FINDINGS: The lungs are clear. Heart is mildly enlarged with pulmonary vascularity within normal limits. No adenopathy. No bone lesions. IMPRESSION: Mild cardiac enlargement.  No edema or consolidation. Electronically Signed   By: Lowella Grip III M.D.   On: 11/05/2015 09:42   Dg Foot Complete Left  Result Date: 10/28/2015 CLINICAL DATA:  Left big toe pain. EXAM: LEFT FOOT - COMPLETE 3+ VIEW COMPARISON:  None. FINDINGS: There is no evidence of fracture or dislocation. No evidence of dystrophic bony changes. There is soft tissue swelling of the distal first digit. Punctate lucencies within the area of soft tissue swelling may represent entrapped fat or potentially foci of gas. Heavy vascular calcifications are seen. IMPRESSION: No evidence of fracture of the left foot. Soft tissue swelling of the distal first digit with punctate lucencies within the area of swelling, which may represent entrapped fat or potentially foci of gas. Please correlate clinically regarding the possibility of cellulitis. Electronically Signed   By: Fidela Salisbury M.D.   On: 10/28/2015 21:01  Dg Fluoro Guide Cv Line-no Report  Result Date: 11/19/2015 CLINICAL DATA:  FLOURO GUIDE CV LINE Fluoroscopy was utilized by the requesting physician.  No radiographic interpretation.      [facility], Mazzy Santarelli M.D on 11/25/2015 at 12:36 PM [phone]

## 2015-11-25 NOTE — Progress Notes (Signed)
   11/25/15 1100  PT Visit Information  Last PT Received On 11/25/15  Reason Eval/Treat Not Completed Patient at procedure or test/unavailable  History of Present Illness 75 y.o. male s/p L great toe amputation. PMH significant HTN, afib on Coumadin, ESRD s/p kidney tranplant 12 years ago, and DM type II.HD M,W,F  Mee Hives, PT MS Acute Rehab Dept. Number: [place] [facility] [place]

## 2015-11-25 NOTE — Progress Notes (Signed)
OT Cancellation Note  Patient Details Name: MICHAELJOHN MCKEEVER MRN: [id number] DOB: 1941-03-21   Cancelled Treatment:     Pt currently in dialysis, will try back at later time for tx session as schedule allows.  Almon Register [id number] 11/25/2015, 8:39 AM

## 2015-11-25 NOTE — Progress Notes (Signed)
Patient's amputation site was redressed without complication.

## 2015-11-25 NOTE — Progress Notes (Signed)
Hemodialysis nurse notified that patient needs to be sitting up during hemodialysis.

## 2015-11-25 NOTE — Procedures (Signed)
Pt seen on HD.  Ap 210 Vp 140.  BFR 400.  Tolerating HD well so far.  Start renavite.  Not sure he needs Mag supp due to renal failure so will DC it and check Mag level

## 2015-11-26 DIAGNOSIS — L89153 Pressure ulcer of sacral region, stage 3: Secondary | ICD-10-CM | POA: Diagnosis not present

## 2015-11-26 DIAGNOSIS — R1312 Dysphagia, oropharyngeal phase: Secondary | ICD-10-CM | POA: Diagnosis not present

## 2015-11-26 DIAGNOSIS — E039 Hypothyroidism, unspecified: Secondary | ICD-10-CM | POA: Diagnosis not present

## 2015-11-26 DIAGNOSIS — S98112S Complete traumatic amputation of left great toe, sequela: Secondary | ICD-10-CM | POA: Diagnosis not present

## 2015-11-26 DIAGNOSIS — L97811 Non-pressure chronic ulcer of other part of right lower leg limited to breakdown of skin: Secondary | ICD-10-CM | POA: Diagnosis not present

## 2015-11-26 DIAGNOSIS — E8770 Fluid overload, unspecified: Secondary | ICD-10-CM | POA: Diagnosis not present

## 2015-11-26 DIAGNOSIS — L97521 Non-pressure chronic ulcer of other part of left foot limited to breakdown of skin: Secondary | ICD-10-CM | POA: Diagnosis not present

## 2015-11-26 DIAGNOSIS — R339 Retention of urine, unspecified: Secondary | ICD-10-CM | POA: Diagnosis not present

## 2015-11-26 DIAGNOSIS — I5033 Acute on chronic diastolic (congestive) heart failure: Secondary | ICD-10-CM | POA: Diagnosis not present

## 2015-11-26 DIAGNOSIS — Z940 Kidney transplant status: Secondary | ICD-10-CM | POA: Diagnosis not present

## 2015-11-26 DIAGNOSIS — Z992 Dependence on renal dialysis: Secondary | ICD-10-CM | POA: Diagnosis not present

## 2015-11-26 DIAGNOSIS — R0989 Other specified symptoms and signs involving the circulatory and respiratory systems: Secondary | ICD-10-CM | POA: Diagnosis not present

## 2015-11-26 DIAGNOSIS — I872 Venous insufficiency (chronic) (peripheral): Secondary | ICD-10-CM | POA: Diagnosis not present

## 2015-11-26 DIAGNOSIS — Z89412 Acquired absence of left great toe: Secondary | ICD-10-CM | POA: Diagnosis not present

## 2015-11-26 DIAGNOSIS — M109 Gout, unspecified: Secondary | ICD-10-CM | POA: Diagnosis not present

## 2015-11-26 DIAGNOSIS — N184 Chronic kidney disease, stage 4 (severe): Secondary | ICD-10-CM | POA: Diagnosis not present

## 2015-11-26 DIAGNOSIS — N185 Chronic kidney disease, stage 5: Secondary | ICD-10-CM | POA: Diagnosis not present

## 2015-11-26 DIAGNOSIS — N2581 Secondary hyperparathyroidism of renal origin: Secondary | ICD-10-CM | POA: Diagnosis not present

## 2015-11-26 DIAGNOSIS — N170 Acute kidney failure with tubular necrosis: Secondary | ICD-10-CM | POA: Diagnosis not present

## 2015-11-26 DIAGNOSIS — L89152 Pressure ulcer of sacral region, stage 2: Secondary | ICD-10-CM | POA: Diagnosis not present

## 2015-11-26 DIAGNOSIS — D689 Coagulation defect, unspecified: Secondary | ICD-10-CM | POA: Diagnosis not present

## 2015-11-26 DIAGNOSIS — M79675 Pain in left toe(s): Secondary | ICD-10-CM | POA: Diagnosis not present

## 2015-11-26 DIAGNOSIS — L97522 Non-pressure chronic ulcer of other part of left foot with fat layer exposed: Secondary | ICD-10-CM | POA: Diagnosis not present

## 2015-11-26 DIAGNOSIS — E119 Type 2 diabetes mellitus without complications: Secondary | ICD-10-CM | POA: Diagnosis not present

## 2015-11-26 DIAGNOSIS — L97812 Non-pressure chronic ulcer of other part of right lower leg with fat layer exposed: Secondary | ICD-10-CM | POA: Diagnosis not present

## 2015-11-26 DIAGNOSIS — L02419 Cutaneous abscess of limb, unspecified: Secondary | ICD-10-CM | POA: Diagnosis not present

## 2015-11-26 DIAGNOSIS — T8189XD Other complications of procedures, not elsewhere classified, subsequent encounter: Secondary | ICD-10-CM | POA: Diagnosis not present

## 2015-11-26 DIAGNOSIS — N390 Urinary tract infection, site not specified: Secondary | ICD-10-CM | POA: Diagnosis not present

## 2015-11-26 DIAGNOSIS — S98119A Complete traumatic amputation of unspecified great toe, initial encounter: Secondary | ICD-10-CM | POA: Diagnosis not present

## 2015-11-26 DIAGNOSIS — L97512 Non-pressure chronic ulcer of other part of right foot with fat layer exposed: Secondary | ICD-10-CM | POA: Diagnosis not present

## 2015-11-26 DIAGNOSIS — I252 Old myocardial infarction: Secondary | ICD-10-CM | POA: Diagnosis not present

## 2015-11-26 DIAGNOSIS — I739 Peripheral vascular disease, unspecified: Secondary | ICD-10-CM | POA: Diagnosis not present

## 2015-11-26 DIAGNOSIS — R278 Other lack of coordination: Secondary | ICD-10-CM | POA: Diagnosis not present

## 2015-11-26 DIAGNOSIS — R296 Repeated falls: Secondary | ICD-10-CM | POA: Diagnosis not present

## 2015-11-26 DIAGNOSIS — I482 Chronic atrial fibrillation: Secondary | ICD-10-CM | POA: Diagnosis not present

## 2015-11-26 DIAGNOSIS — M6281 Muscle weakness (generalized): Secondary | ICD-10-CM | POA: Diagnosis not present

## 2015-11-26 DIAGNOSIS — N186 End stage renal disease: Secondary | ICD-10-CM | POA: Diagnosis not present

## 2015-11-26 DIAGNOSIS — L89512 Pressure ulcer of right ankle, stage 2: Secondary | ICD-10-CM | POA: Diagnosis not present

## 2015-11-26 DIAGNOSIS — I509 Heart failure, unspecified: Secondary | ICD-10-CM | POA: Diagnosis not present

## 2015-11-26 DIAGNOSIS — E1129 Type 2 diabetes mellitus with other diabetic kidney complication: Secondary | ICD-10-CM | POA: Diagnosis not present

## 2015-11-26 DIAGNOSIS — M1990 Unspecified osteoarthritis, unspecified site: Secondary | ICD-10-CM | POA: Diagnosis not present

## 2015-11-26 DIAGNOSIS — X58XXXS Exposure to other specified factors, sequela: Secondary | ICD-10-CM | POA: Diagnosis not present

## 2015-11-26 DIAGNOSIS — L97511 Non-pressure chronic ulcer of other part of right foot limited to breakdown of skin: Secondary | ICD-10-CM | POA: Diagnosis not present

## 2015-11-26 DIAGNOSIS — S98112D Complete traumatic amputation of left great toe, subsequent encounter: Secondary | ICD-10-CM | POA: Diagnosis not present

## 2015-11-26 DIAGNOSIS — E1140 Type 2 diabetes mellitus with diabetic neuropathy, unspecified: Secondary | ICD-10-CM | POA: Diagnosis not present

## 2015-11-26 DIAGNOSIS — J449 Chronic obstructive pulmonary disease, unspecified: Secondary | ICD-10-CM | POA: Diagnosis not present

## 2015-11-26 DIAGNOSIS — I10 Essential (primary) hypertension: Secondary | ICD-10-CM | POA: Diagnosis not present

## 2015-11-26 DIAGNOSIS — L309 Dermatitis, unspecified: Secondary | ICD-10-CM | POA: Diagnosis not present

## 2015-11-26 DIAGNOSIS — L89610 Pressure ulcer of right heel, unstageable: Secondary | ICD-10-CM | POA: Diagnosis not present

## 2015-11-26 DIAGNOSIS — I4891 Unspecified atrial fibrillation: Secondary | ICD-10-CM | POA: Diagnosis not present

## 2015-11-26 DIAGNOSIS — I96 Gangrene, not elsewhere classified: Secondary | ICD-10-CM | POA: Diagnosis not present

## 2015-11-26 DIAGNOSIS — I132 Hypertensive heart and chronic kidney disease with heart failure and with stage 5 chronic kidney disease, or end stage renal disease: Secondary | ICD-10-CM | POA: Diagnosis not present

## 2015-11-26 DIAGNOSIS — R34 Anuria and oliguria: Secondary | ICD-10-CM | POA: Diagnosis not present

## 2015-11-26 DIAGNOSIS — R41841 Cognitive communication deficit: Secondary | ICD-10-CM | POA: Diagnosis not present

## 2015-11-26 DIAGNOSIS — R2681 Unsteadiness on feet: Secondary | ICD-10-CM | POA: Diagnosis not present

## 2015-11-26 DIAGNOSIS — E785 Hyperlipidemia, unspecified: Secondary | ICD-10-CM | POA: Diagnosis not present

## 2015-11-26 DIAGNOSIS — E11621 Type 2 diabetes mellitus with foot ulcer: Secondary | ICD-10-CM | POA: Diagnosis not present

## 2015-11-26 LAB — GLUCOSE, CAPILLARY
GLUCOSE-CAPILLARY: 234 mg/dL — AB (ref 65–99)
Glucose-Capillary: 125 mg/dL — ABNORMAL HIGH (ref 65–99)
Glucose-Capillary: 137 mg/dL — ABNORMAL HIGH (ref 65–99)

## 2015-11-26 LAB — PROTIME-INR
INR: 3.58
PROTHROMBIN TIME: 36.7 s — AB (ref 11.4–15.2)

## 2015-11-26 MED ORDER — ALLOPURINOL 300 MG PO TABS: ORAL_TABLET | ORAL | 5 refills | Status: AC

## 2015-11-26 MED ORDER — WARFARIN SODIUM 5 MG PO TABS: 2.5000 mg | ORAL_TABLET | ORAL | Status: DC

## 2015-11-26 MED ORDER — RENA-VITE PO TABS: 1.0000 | ORAL_TABLET | Freq: Every day | ORAL | 0 refills | Status: AC

## 2015-11-26 MED ORDER — INSULIN GLARGINE 100 UNIT/ML ~~LOC~~ SOLN: 26.0000 [IU] | Freq: Every day | SUBCUTANEOUS | 11 refills | Status: DC

## 2015-11-26 MED ORDER — HYDROCODONE-ACETAMINOPHEN 5-325 MG PO TABS: 1.0000 | ORAL_TABLET | ORAL | 0 refills | Status: DC | PRN

## 2015-11-26 MED ORDER — LEVALBUTEROL HCL 1.25 MG/0.5ML IN NEBU: 1.2500 mg | INHALATION_SOLUTION | Freq: Four times a day (QID) | RESPIRATORY_TRACT | 12 refills | Status: DC | PRN

## 2015-11-26 MED ORDER — BOOST / RESOURCE BREEZE PO LIQD: 1.0000 | Freq: Three times a day (TID) | ORAL | 0 refills | Status: AC

## 2015-11-26 MED ORDER — INSULIN ASPART 100 UNIT/ML ~~LOC~~ SOLN: 0.0000 [IU] | Freq: Three times a day (TID) | SUBCUTANEOUS | 11 refills | Status: DC

## 2015-11-26 MED ORDER — PANTOPRAZOLE SODIUM 40 MG PO TBEC: 40.0000 mg | DELAYED_RELEASE_TABLET | Freq: Two times a day (BID) | ORAL | Status: DC

## 2015-11-26 MED ORDER — TAMSULOSIN HCL 0.4 MG PO CAPS: 0.4000 mg | ORAL_CAPSULE | Freq: Every day | ORAL | Status: AC

## 2015-11-26 NOTE — Progress Notes (Signed)
[facility]  Subjective  - Doing much better over all.   Objective (!) 149/59 73 97.8 F (36.6 C) (Oral) 18 100%  Intake/Output Summary (Last 24 hours) at 11/26/15 0748 Last data filed at 11/25/15 1924  Gross per 24 hour  Intake              210 ml  Output             2781 ml  Net            -2571 ml   Left great toe amputation site 50% beefy red base.  Wet to dry dressing changed. Left AV fistula palpable thrill Heart Irregular A fib Lungs non labored breathing  Assessment/Planning: left BC AVF and insertion right IJ TDC  4Day Post Op And Angioplasty left anterior tibial artery, atherectomy and drug coated balloon angioplasty, left popliteal artery POD 18, left 1st great toe amputation POD 16.   Continue wet to dry dressing changes  Twice daily -f/u with Dr. Hoyle Sauer 4weeks to check maturation of AVF & toe amputation site.  Laurence Slate [facility] 11/26/2015 7:48 AM --  Laboratory Lab Results:  Recent Labs  11/25/15 0745  WBC 5.8  HGB 10.9*  HCT 34.6*  PLT 194   BMET  Recent Labs  11/25/15 0745  NA 134*  K 4.1  CL 98*  CO2 26  GLUCOSE 131*  BUN 42*  CREATININE 5.75*  CALCIUM 8.8*    COAG Lab Results  Component Value Date   INR 3.58 11/26/2015   INR 2.84 11/25/2015   INR 2.33 11/24/2015   No results found for: PTT

## 2015-11-26 NOTE — Care Management Note (Signed)
Case Management Note Previous CM note initiated by Jonnie Finner  Patient Details  Name: Mark [name] MRN: [id number] Date of Birth: Sep 20, 1940  Subjective/Objective:   Amputation of left first toe with resection of metatarsal head 8/2                 Action/Plan: Discharge Planning:  11/21/15- d/c plan is for SNF- CSW following for placement needs- pt has now started on HD on 8/10 - clipping process has been started for outpt HD location- pt will need to tolerate HD in chair prior to discharge.   Chart reviewed. Waiting final recommendations SNF vs HH. Pt preoperatively arranged with [facility] for HH.     Expected Discharge Date:                  Expected Discharge Plan:  Skilled Nursing Facility  In-House Referral:  Clinical Social Work  Discharge planning Services  CM Consult  Post Acute Care Choice:  Home Health Choice offered to:     DME Arranged:    DME Agency:     HH Arranged:    [place] Agency:  Other - See comment, [place]  Status of Service:  Completed, signed off  If discussed at [place] of Stay Meetings, dates discussed:    Additional Comments:  11/27/15- 1500- [facility]- pt stable for d/c today- tolerating HD in chair- and has been clipped to outpt HD center- CSW following for placement needs- plan [facility]  Dawayne Patricia, RN 11/26/2015, 3:38 PM [phone]

## 2015-11-26 NOTE — Progress Notes (Signed)
ANTICOAGULATION CONSULT NOTE - Follow Up Consult  Pharmacy Consult for Coumadin Indication: afib   Allergies  Allergen Reactions  . Tape Other (See Comments)    SKIN WILL TEAR!!    Patient Measurements: Height: 5\' 9"  (175.3 cm) Weight: 198 lb 10.2 oz (90.1 kg) (standing weight) IBW/kg (Calculated) : 70.7  HDWt: 92kg  Vital Signs: Temp: 97.8 F (36.6 C) (08/22 0535) Temp Source: Oral (08/22 0535) BP: 118/52 (08/22 0908) Pulse Rate: 89 (08/22 0908)  Labs:  Recent Labs  11/24/15 0243 11/25/15 0323 11/25/15 0745 11/26/15 0323  HGB  --   --  10.9*  --   HCT  --   --  34.6*  --   PLT  --   --  194  --   LABPROT 26.0* 30.4*  --  36.7*  INR 2.33 2.84  --  3.58  CREATININE  --   --  5.75*  --     Estimated Creatinine Clearance: 12.3 mL/min (by C-G formula based on SCr of 5.75 mg/dL).   Assessment: 9 yoM on warfarin PTA for afib. INR 2.84 < 2.33 over last 24 hours. CBC stable.   Warfarin PTA dosing: 5mg  daily except 2.5mg  on Tuesdays and Thursdays.   INR this morning is supratherapeutic at 3.58. Recommend holding warfarin tonight and then resume home dose.  Goal of Therapy:  INR 2-3 Monitor platelets by anticoagulation protocol: Yes    Plan:  Hold warfarin tonight Daily INR Monitor s/sx of bleeding  Andrey Cota. Diona Foley, PharmD, [place] Clinical Pharmacist Pager [phone] 11/26/2015, 9:34 AM

## 2015-11-26 NOTE — Progress Notes (Signed)
S: No new CO O:BP (!) 149/59 (BP Location: Right Arm)   Pulse 73   Temp 97.8 F (36.6 C) (Oral)   Resp 18   Ht 5\' 9"  (1.753 m)   Wt 90.1 kg (198 lb 10.2 oz) Comment: standing weight  SpO2 100%   BMI 29.33 kg/m   Intake/Output Summary (Last 24 hours) at 11/26/15 0654 Last data filed at 11/25/15 1924  Gross per 24 hour  Intake              210 ml  Output             2781 ml  Net            -2571 ml   Weight change:  [id number] and alert [id number], Irreg Resp:Clear Abd: +BS NTND Ext: no edema.  Lt foot bandaged. LUA AVF + bruit NEURO:   . allopurinol  150 mg Oral Daily  . aspirin  81 mg Oral Daily  . calcitRIOL  0.5 mcg Oral Q M,W,F-HD  . famotidine  20 mg Oral QHS  . feeding supplement  1 Container Oral TID WC  . insulin aspart  0-15 Units Subcutaneous TID WC  . insulin aspart  5 Units Subcutaneous TID WC  . insulin glargine  22 Units Subcutaneous QHS  . levothyroxine  137 mcg Oral QAC breakfast  . metoprolol succinate  75 mg Oral Daily  . multivitamin  1 tablet Oral QHS  . pantoprazole  40 mg Oral BID  . predniSONE  5 mg Oral Daily  . rosuvastatin  10 mg Oral QPM  . saccharomyces boulardii  250 mg Oral BID  . senna-docusate  1 tablet Oral BID  . tacrolimus  0.5 mg Oral BID  . tamsulosin  0.4 mg Oral QPC supper  . Warfarin - Pharmacist Dosing Inpatient   Does not apply q1800   No results found. BMET    Component Value Date/Time   NA 134 (L) 11/25/2015 0745   K 4.1 11/25/2015 0745   CL 98 (L) 11/25/2015 0745   CO2 26 11/25/2015 0745   GLUCOSE 131 (H) 11/25/2015 0745   BUN 42 (H) 11/25/2015 0745   CREATININE 5.75 (H) 11/25/2015 0745   CALCIUM 8.8 (L) 11/25/2015 0745   GFRNONAA 9 (L) 11/25/2015 0745   GFRAA 10 (L) 11/25/2015 0745   CBC    Component Value Date/Time   WBC 5.8 11/25/2015 0745   RBC 3.57 (L) 11/25/2015 0745   HGB 10.9 (L) 11/25/2015 0745   HCT 34.6 (L) 11/25/2015 0745   PLT 194 11/25/2015 0745   MCV 96.9 11/25/2015 0745   MCH 30.5  11/25/2015 0745   MCHC 31.5 11/25/2015 0745   RDW 16.7 (H) 11/25/2015 0745   LYMPHSABS 1.9 10/28/2015 1919   MONOABS 0.9 10/28/2015 1919   EOSABS 0.0 10/28/2015 1919   BASOSABS 0.0 10/28/2015 1919     Assessment: 1.  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  Now HD dependent.  Only time will tell if renal fx will recover.  He is set up at [facility] MWF 2nd shift 2. HTN 3. DM 4. SP amputation Lt great toe 5. Anemia Not requiring ESA yet 6. Sec HPTH  PTH 82 7/17 on calcitriol  Plan: 1. HD tomorrow if still here 2. Awaiting NHP   [facility]

## 2015-11-26 NOTE — Progress Notes (Signed)
[place] called with report. No further questions. Pt waiting on [facility] for transportation.

## 2015-11-26 NOTE — Discharge Summary (Signed)
ZADE FALKNER, is a 75 y.o. male  DOB [birth year]  MRN [id number].  Admission date:  10/28/2015  Admitting Physician  Etta Quill, DO  Discharge Date:  11/26/2015   Primary MD  Rochel Brome, MD  Recommendations for primary care physician for things to follow:  - The please monitor INR closely and adjust warfarin dose as needed, check INR in a.m., hold warfarin this evening, INR is 3.5 at day of discharge. - Patient with urinary retention during hospital stay, failed voiding trial at day of discharge, so Foley catheter was reinserted, attempt voiding trial in 2 weeks, if no success reinserted Foley catheter and have patient follow with urology  Dr. Vicie Mutters. - Continue hemodialysis on Monday Wednesday Friday schedule, placed at [facility], for hemodialysis tomorrow  Admission Diagnosis  Gangrene of toe ([place]) [I96]   Discharge Diagnosis  Gangrene of toe ([place]) [I96]    Principal Problem:   Gangrene of toe ([place]) Active Problems:   Type II diabetes mellitus, uncontrolled ([place])   Renal transplant recipient   Diabetic ulcer of left great toe ([place])   Cellulitis of great toe of left foot   Aspiration of liquid   SOB (shortness of breath)   Permanent atrial fibrillation ([facility])   Bibasilar crackles   CHF (congestive heart failure) ([place])   Melena   Renal failure      Past Medical History:  Diagnosis Date  . Atrial fibrillation ([place])   . Diabetes ([place])   . Gout   . Hypertension   . Hypothyroidism   . Renal disorder    kidney transplant   . Ulcer of other part of foot 12/15/2013    Past Surgical History:  Procedure Laterality Date  . AMPUTATION Left 11/06/2015   Procedure: AMPUTATION LEFT GREAT TOE;  Surgeon: Elam Dutch, MD;  Location: [place];  Service: Vascular;  Laterality: Left;  . ANKLE FRACTURE SURGERY    . AV FISTULA PLACEMENT Left 11/19/2015   Procedure: LEFT ARM ARTERIOVENOUS (AV)  FISTULA CREATION;  Surgeon: Waynetta Sandy, MD;  Location: [facility];  Service: Vascular;  Laterality: Left;  . CATARACT EXTRACTION    . INSERTION OF DIALYSIS CATHETER Right 11/19/2015   Procedure: INSERTION OF DIALYSIS CATHETER RIGHT INTERNAL JUGULAR;  Surgeon: Waynetta Sandy, MD;  Location: [facility];  Service: Vascular;  Laterality: Right;  . IR GENERIC HISTORICAL  11/14/2015   IR FLUORO GUIDE CV LINE RIGHT 11/14/2015 Markus Daft, MD [facility]INTER[facility]  . IR GENERIC HISTORICAL  11/14/2015   IR US GUIDE VASC ACCESS RIGHT 11/14/2015 Markus Daft, MD [facility]INTER[facility]  . MELANOMA EXCISION    . NEPHRECTOMY TRANSPLANTED ORGAN    . PERIPHERAL VASCULAR CATHETERIZATION N/A 11/05/2015   Procedure: Abdominal Aortogram w/Lower Extremity;  Surgeon: Serafina Mitchell, MD;  Location: [facility];  Service: Cardiovascular;  Laterality: N/A;  . PERIPHERAL VASCULAR CATHETERIZATION Left 11/05/2015   Procedure: Peripheral Vascular Atherectomy;  Surgeon: Serafina Mitchell, MD;  Location: [facility];  Service: Cardiovascular;  Laterality: Left;  popiteal  . PERIPHERAL VASCULAR CATHETERIZATION Left 11/05/2015   Procedure: Peripheral Vascular Balloon Angioplasty;  Surgeon: Serafina Mitchell, MD;  Location: [facility];  Service: Cardiovascular;  Laterality: Left;  anterior tib       History of present illness and  Hospital Course:     Kindly see H&P for history of present illness and admission details, please review complete Labs, Consult reports and Test reports for all details in brief  HPI  from the history and physical done on the day of admission 10/28/2015  HPI: SABA NEUMAN is a 75 y.o. 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  Patient presents to ED with c/o 'his toe is dead'.  This onset about 1 month ago.  Finally saw doctor today after he fell getting out of the shower this morning.  His doctor was concerned about infection  and sent him to the ED.  No fever no vomiting.  ED Course: See physical exam below for pictures.  Toe is grossly infected and necrotic and his family member's description of 'his toe is dead' seems likely accurate.  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 Patient presents to ED with c/o 'his toe is dead'. Creatinine baseline appears to be 1.8-2.3. Primary nephrologist is Dr. Justin Mend. Saw him on 06/26/2015 with BUN 27, creatinine 1.88. Admitted on 10/28/2015 with necrotic left great toe with cellulitis. Creatinine on admission was 2.6. Seen by [name] in consult on 7/25. His Prograf level was found to be low and was increased. 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 He was on Vancomycin and Zosyn from 7/24-8/3. He developed some dyspnea in the setting of being positive about 13 liters since admission with evidence of volume overload on exam. Worsening renal failure. Placement of temporary hemodialysis catheter on 8/10. Now started on HD. Plan to continue hemodialysis for next 3 month, then reassess.  Acute on Chronic kidney disease stage V, status post renal transplant ,  -thought to be acute process caused by contrast , however, atheroembolic event remains possible as well given his stuttering creatinine increases - Management per nephrology, currently hemodialysis dependent, the plan is to do a 3 month trial for history of present illness, hope for recovery, meanwhile will continue with oral antirejection medication, and if no recovery in 3 months and can be resumed ESRD. - . Right IJ tunneled dialysis catheter placement with US guidance,  leftbrachiocephalic arteriovenous fistula placement placed on  8/15.  Acute on chronic diastolic heart failure - No improvement with recess initially. - Currently on hemodialysis, volume management with dialysis.  Gangrene of left  Toe/Toe Amputation: -S /P atherectomy and DCB Left popliteal artery and PTA left ATA (POD 5) -S/P Ray amputation of left great toe  -Palpable Left popliteal pulse; wound is healing properly -Follow with vascular surgery Dr. Silvano Bilis as an outpatient -patient has completed antibiotic therapy  Urinary retention  - Bladder scan showing more than 600 mL , staff unable to do in and out , urology consulted . Foley catheter inserted on 8/16, Foley catheter discontinued 8/18,  failed voiding trial at day of discharge, so Foley catheter was reinserted, attempt voiding trial in 2  weeks, if no success reinserted Foley catheter and have patient follow with urology  Dr. Vicie Mutters. - started on flomax  Anemia of chronic diseases,  -FOBT positive, but no signs of acute bleeding appreciated -with ongoing hemodilution from anasarca/fluid overload, Hgb has remained stable around 10.5 for several days.  -Continue PPI twice a day -follow Hgb trend   Diarrhea - C. difficile negative 2 during this admission - Resolved, actually now with constipation, on when necessary stool softener.   A fib with aberrancy -  -continue b-blocker for rate controlled -on coumadin for anticoagulation (dose per pharmacy) -CHADsVASC score 4  DM2 -   -Hemoglobin A1c 8.2 -will continue SSI, and lantus  Hypertension:  - Continue with metoprolol XL 100 mg oral daily.  Hypothyroidism - continue Synthroid  Protein Calorie Malnutrition -Continue with supplement  Dyslipidemia -continue Crestor  Pulmonary hypertension: Mr. Reierson has moderate pulmonary hypertension.   -Cardiology Recommending outpatient sleep study and PFTs.  -He does have grade 2 diastolic dysfunction and will benefit of volume control -currently needing  HD   Discharge Condition:  Stable   Follow UP  Follow-up Information    Annamarie Major, MD Follow up in 4 week(s).   Specialties:  Vascular Surgery, Cardiology Why:  Office will call you to arrange your appt (sent) Contact information: [place] [address] [phone]        MACDIARMID,SCOTT A, MD.   Specialty:  Urology Why:  As needed Contact information: [place] [address] [phone]             Discharge Instructions  and  Discharge Medications    Discharge Instructions    Discharge instructions    Complete by:  As directed   Follow with Primary MD Rochel Brome, MD after discharge from SNF.  Get CBC, CMP, 2 view Chest X ray checked  by Primary MD next visit.    Activity: As tolerated with Full fall precautions use walker/cane & assistance as needed   Disposition SNF   Diet: Renal diet, carbohydrate modified, 1200 mL fluid restriction , with feeding assistance and aspiration precautions.  For Heart failure patients - Check your Weight same time everyday, if you gain over 2 pounds, or you develop in leg swelling, experience more shortness of breath or chest pain, call your Primary MD immediately. Follow Cardiac Low Salt Diet and 1.5 lit/day fluid restriction.   On your next visit with your primary care physician please Get Medicines reviewed and adjusted.   Please request your Prim.MD to go over all Hospital Tests and Procedure/Radiological results at the follow up, please get all Hospital records sent to your Prim MD by signing hospital release before you go home.   If you experience worsening of your admission symptoms, develop shortness of breath, life threatening emergency, suicidal or homicidal thoughts you must seek medical attention immediately by calling 911 or calling your MD immediately  if symptoms less severe.  You Must read complete instructions/literature along with all the possible adverse reactions/side  effects for all the Medicines you take and that have been prescribed to you. Take any new Medicines after you have completely understood and accpet all the possible adverse reactions/side effects.   Do not drive, operating heavy machinery, perform activities at heights, swimming or participation in water activities or provide baby sitting services if your were admitted for syncope or siezures until you have seen by Primary MD or a Neurologist and advised to do so again.  Do not  drive when taking Pain medications.    Do not take more than prescribed Pain, Sleep and Anxiety Medications  Special Instructions: If you have smoked or chewed Tobacco  in the last 2 yrs please stop smoking, stop any regular Alcohol  and or any Recreational drug use.  Wear Seat belts while driving.   Please note  You were cared for by a hospitalist during your hospital stay. If you have any questions about your discharge medications or the care you received while you were in the hospital after you are discharged, you can call the unit and asked to speak with the hospitalist on call if the hospitalist that took care of you is not available. Once you are discharged, your primary care physician will handle any further medical issues. Please note that NO REFILLS for any discharge medications will be authorized once you are discharged, as it is imperative that you return to your primary care physician (or establish a relationship with a primary care physician if you do not have one) for your aftercare needs so that they can reassess your need for medications and monitor your lab values.       Medication List    STOP taking these medications   BD PEN NEEDLE NANO U/F 32G X 4 MM Misc Generic drug:  Insulin Pen Needle   DIGOX 0.125 MG tablet Generic drug:  digoxin   furosemide 80 MG tablet Commonly known as:  LASIX   INSULIN SYRINGE .3CC/31GX5/16" 31G X 5/16" 0.3 ML Misc   magnesium oxide 400 MG tablet Commonly known  as:  MAG-OX   NOVOLIN R 100 units/mL injection Generic drug:  insulin regular   omeprazole 20 MG capsule Commonly known as:  PRILOSEC   ONETOUCH DELICA LANCETS FINE Misc   ONETOUCH VERIO test strip Generic drug:  glucose blood   PHOSPHA 250 NEUTRAL 155-852-130 MG tablet Generic drug:  phosphorus   potassium chloride SA 20 MEQ tablet Commonly known as:  K-DUR,KLOR-CON   V-GO 40 Kit     TAKE these medications   acetaminophen 500 MG tablet Commonly known as:  TYLENOL Take 500 mg by mouth every 6 (six) hours as needed for mild pain or moderate pain.   allopurinol 300 MG tablet Commonly known as:  ZYLOPRIM Take 150 mg by mouth in the morning What changed:  See the new instructions.   calcitRIOL 0.5 MCG capsule Commonly known as:  ROCALTROL Take one by mouth daily   feeding supplement Liqd Take 1 Container by mouth 3 (three) times daily with meals.   fenofibrate micronized 134 MG capsule Commonly known as:  LOFIBRA TAKE ONE CAPSULE EVERY DAY WITH A MEAL What changed:  See the new instructions.   HYDROcodone-acetaminophen 5-325 MG tablet Commonly known as:  NORCO/VICODIN Take 1-2 tablets by mouth every 4 (four) hours as needed for moderate pain.   insulin aspart 100 UNIT/ML injection Commonly known as:  novoLOG Inject 0-15 Units into the skin 3 (three) times daily with meals.   insulin glargine 100 UNIT/ML injection Commonly known as:  LANTUS Inject 0.26 mLs (26 Units total) into the skin at bedtime.   ketoconazole 2 % shampoo Commonly known as:  NIZORAL Apply 1 application topically 2 (two) times a week.   levalbuterol 1.25 MG/0.5ML nebulizer solution Commonly known as:  XOPENEX Take 1.25 mg by nebulization every 6 (six) hours as needed for wheezing or shortness of breath.   levothyroxine 137 MCG tablet Commonly known as:  SYNTHROID, LEVOTHROID Take 137 mcg  by mouth daily before breakfast.   metoprolol succinate 50 MG 24 hr tablet Commonly known as:   TOPROL-XL Take 50 mg by mouth daily.   multivitamin Tabs tablet Take 1 tablet by mouth at bedtime.   mupirocin ointment 2 % Commonly known as:  BACTROBAN Apply 1 application topically daily as needed.   nystatin powder Generic drug:  nystatin Apply topically daily as needed.   pantoprazole 40 MG tablet Commonly known as:  PROTONIX Take 1 tablet (40 mg total) by mouth 2 (two) times daily.   predniSONE 5 MG tablet Commonly known as:  DELTASONE Take 5 mg by mouth daily.   PROGRAF 0.5 MG capsule Generic drug:  tacrolimus Take 0.5 mg by mouth 2 (two) times daily.   rosuvastatin 10 MG tablet Commonly known as:  CRESTOR Take 10 mg by mouth every evening.   tamsulosin 0.4 MG Caps capsule Commonly known as:  FLOMAX Take 1 capsule (0.4 mg total) by mouth daily after supper.   warfarin 5 MG tablet Commonly known as:  COUMADIN Take 0.5-1 tablets (2.5-5 mg total) by mouth See admin instructions. 5 mg in the evening on Sun/Mon/Wed/Fri/Sat and 2.5 mg on Tues/Thurs Start taking on:  11/27/2015         Diet and Activity recommendation: See Discharge Instructions above   Consults obtained -  Vascular surgery, nephrology, cardiology   Major procedures and Radiology Reports - PLEASE review detailed and final reports for all details, in brief -   Atherectomy and drug coated balloon angioplasty 8/1 , left ray amputation 8/2,  Right IJ tunneled dialysis catheter placement with US guidance,  leftbrachiocephalic arteriovenous fistula placement on 8/15    Ir Fluoro Guide Cv Line Right  Result Date: 11/14/2015 INDICATION: Acute kidney injury and needs hemodialysis. Plan for placement of non tunneled catheter due to elevated INR level. EXAM: FLUOROSCOPIC AND ULTRASOUND GUIDED PLACEMENT OF A NON TUNNELED DIALYSIS CATHETER Physician: Stephan Minister. Henn, MD MEDICATIONS: None ANESTHESIA/SEDATION: None FLUOROSCOPY TIME:  Fluoroscopy Time: 24 seconds COMPLICATIONS: None immediate. PROCEDURE:  Informed consent was obtained for placement of a non tunneled dialysis catheter. The patient was placed supine on the interventional table. Ultrasound confirmed a patent right internal jugular vein. Ultrasound images were obtained for documentation. The right side of the neck was prepped and draped in a sterile fashion. The right neck was anesthetized with 1% lidocaine. Maximal barrier sterile technique was utilized including caps, mask, sterile gowns, sterile gloves, sterile drape, hand hygiene and skin antiseptic. A small incision was made with #11 blade scalpel. A 21 gauge needle directed into the right internal jugular vein with ultrasound guidance. A micropuncture dilator set was placed. 20 cm Mahurkar catheter was selected. J wire was advanced into the IVC. Tract was dilated to accommodate the catheter. Catheter tip placed near the superior cavoatrial junction. All the lumens aspirated and flushed well. Appropriate amount of heparin was placed in the dialysis lumens. Catheter was sutured to the skin with Prolene suture. Fluoroscopic and ultrasound images were taken and saved for documentation. FINDINGS: Patent right internal jugular vein. Catheter tip near the superior cavoatrial junction. IMPRESSION: Successful placement of a non tunneled dialysis catheter using ultrasound and fluoroscopic guidance. Electronically Signed   By: Markus Daft M.D.   On: 11/14/2015 19:52   Ir US Guide Vasc Access Right  Result Date: 11/14/2015 INDICATION: Acute kidney injury and needs hemodialysis. Plan for placement of non tunneled catheter due to elevated INR level. EXAM: FLUOROSCOPIC AND ULTRASOUND GUIDED PLACEMENT OF A  NON TUNNELED DIALYSIS CATHETER Physician: Stephan Minister. Henn, MD MEDICATIONS: None ANESTHESIA/SEDATION: None FLUOROSCOPY TIME:  Fluoroscopy Time: 24 seconds COMPLICATIONS: None immediate. PROCEDURE: Informed consent was obtained for placement of a non tunneled dialysis catheter. The patient was placed supine on the  interventional table. Ultrasound confirmed a patent right internal jugular vein. Ultrasound images were obtained for documentation. The right side of the neck was prepped and draped in a sterile fashion. The right neck was anesthetized with 1% lidocaine. Maximal barrier sterile technique was utilized including caps, mask, sterile gowns, sterile gloves, sterile drape, hand hygiene and skin antiseptic. A small incision was made with #11 blade scalpel. A 21 gauge needle directed into the right internal jugular vein with ultrasound guidance. A micropuncture dilator set was placed. 20 cm Mahurkar catheter was selected. J wire was advanced into the IVC. Tract was dilated to accommodate the catheter. Catheter tip placed near the superior cavoatrial junction. All the lumens aspirated and flushed well. Appropriate amount of heparin was placed in the dialysis lumens. Catheter was sutured to the skin with Prolene suture. Fluoroscopic and ultrasound images were taken and saved for documentation. FINDINGS: Patent right internal jugular vein. Catheter tip near the superior cavoatrial junction. IMPRESSION: Successful placement of a non tunneled dialysis catheter using ultrasound and fluoroscopic guidance. Electronically Signed   By: Markus Daft M.D.   On: 11/14/2015 19:52   Dg Chest Port 1 View  Result Date: 11/19/2015 CLINICAL DATA:  As post dialysis catheter placement EXAM: PORTABLE CHEST 1 VIEW COMPARISON:  11/09/2015 FINDINGS: Previously seen temper a catheter is been removed in a new right jugular dialysis catheter placed in satisfactory position. Cardiac shadow is within normal limits. Vascular congestion is noted likely related to volume overload. No sizable effusion is seen. IMPRESSION: Vascular congestion likely related to volume overload. No pneumothorax is noted. Electronically Signed   By: Inez Catalina M.D.   On: 11/19/2015 10:03   Dg Chest Port 1 View  Result Date: 11/09/2015 CLINICAL DATA:  Shortness of breath  EXAM: PORTABLE CHEST 1 VIEW COMPARISON:  November 06, 2015 FINDINGS: Mild cardiomegaly. The hila and mediastinum are unchanged. Diffuse bilateral interstitial opacities again identified and similar in the interval. The opacity is a little more focal in the left suprahilar region which is increased since November 05, 2015 but unchanged since November 06, 2015. No other acute interval changes. There may be tiny bilateral effusions with blunting of the costophrenic angles. IMPRESSION: 1. Suspected tiny bilateral pleural effusions and diffuse increased interstitial markings are most consistent with pulmonary edema. Atypical viral infection could have a similar appearance. Recommend clinical correlation. *Opacity in the left suprahilar region is a little more focal when compared November 05, 2015. The difference could be due to confluence of shadows. Recommend follow-up to resolution. Electronically Signed   By: Dorise Bullion III M.D   On: 11/09/2015 09:14   Dg Chest Port 1 View  Result Date: 11/06/2015 CLINICAL DATA:  75 year old male with increasing shortness of breath and abnormal left side pulmonary auscultation. Initial encounter. EXAM: PORTABLE CHEST 1 VIEW COMPARISON:  0508 hours today and earlier. FINDINGS: Portable AP semi upright view at 2127 hours. Continued and mildly increased perihilar and basilar predominant interstitial pulmonary opacity. Stable cardiac size and mediastinal contours. No pneumothorax. No pleural effusion or consolidation identified. IMPRESSION: Increased bilateral pulmonary interstitial opacity. Top differential considerations include progression of interstitial edema versus viral/ atypical respiratory infection. No pleural effusion identified. Electronically Signed   By: Herminio Heads.D.  On: 11/06/2015 21:42   Dg Chest Port 1 View  Result Date: 11/06/2015 CLINICAL DATA:  75 year old male with aspiration of liquid. EXAM: PORTABLE CHEST 1 VIEW COMPARISON:  Chest radiograph dated 11/05/2015  FINDINGS: Mild cardiomegaly with central vascular congestion. Bibasilar Interstitial prominence and nodular densities, new from prior study may represent atelectatic changes versus infiltrate. There is no pleural effusion or pneumothorax. Top-normal cardiac silhouette. No acute osseous pathology. IMPRESSION: Mild congestive changes with bibasilar subsegmental atelectasis versus infiltrate. Electronically Signed   By: Anner Crete M.D.   On: 11/06/2015 05:16   Dg Chest Port 1 View  Result Date: 11/05/2015 CLINICAL DATA:  Shortness of breath EXAM: PORTABLE CHEST 1 VIEW COMPARISON:  March 14, 2015 FINDINGS: The lungs are clear. Heart is mildly enlarged with pulmonary vascularity within normal limits. No adenopathy. No bone lesions. IMPRESSION: Mild cardiac enlargement.  No edema or consolidation. Electronically Signed   By: Lowella Grip III M.D.   On: 11/05/2015 09:42   Dg Foot Complete Left  Result Date: 10/28/2015 CLINICAL DATA:  Left big toe pain. EXAM: LEFT FOOT - COMPLETE 3+ VIEW COMPARISON:  None. FINDINGS: There is no evidence of fracture or dislocation. No evidence of dystrophic bony changes. There is soft tissue swelling of the distal first digit. Punctate lucencies within the area of soft tissue swelling may represent entrapped fat or potentially foci of gas. Heavy vascular calcifications are seen. IMPRESSION: No evidence of fracture of the left foot. Soft tissue swelling of the distal first digit with punctate lucencies within the area of swelling, which may represent entrapped fat or potentially foci of gas. Please correlate clinically regarding the possibility of cellulitis. Electronically Signed   By: Fidela Salisbury M.D.   On: 10/28/2015 21:01  Dg Fluoro Guide Cv Line-no Report  Result Date: 11/19/2015 CLINICAL DATA:  FLOURO GUIDE CV LINE Fluoroscopy was utilized by the requesting physician.  No radiographic interpretation.    Micro Results    Recent Results (from the past  240 hour(s))  Surgical pcr screen     Status: Abnormal   Collection Time: 11/19/15  5:44 AM  Result Value Ref Range Status   MRSA, PCR NEGATIVE NEGATIVE Final   Staphylococcus aureus POSITIVE (A) NEGATIVE Final    Comment:        The Xpert SA Assay (FDA approved for NASAL specimens in patients over 4 years of age), is one component of a comprehensive surveillance program.  Test performance has been validated by [facility] for patients greater than or equal to 76 year old. It is not intended to diagnose infection nor to guide or monitor treatment.        Today   Subjective:   Casper Pagliuca today has no headache,no chest Or abdominal pain, report generalized weakness . Objective:   Blood pressure (!) 118/52, pulse 89, temperature 97.8 F (36.6 C), temperature source Oral, resp. rate 18, height '5\' 9"'  (1.753 m), weight 90.1 kg (198 lb 10.2 oz), SpO2 100 %.   Intake/Output Summary (Last 24 hours) at 11/26/15 1212 Last data filed at 11/25/15 1924  Gross per 24 hour  Intake              210 ml  Output                1 ml  Net              209 ml    Exam General: AAOX3, in no acute distress, sitting in a recliner  in hemodialysis Head/eyes: [place]/AT,PERRLA, no icterus Neck:Supple Neck,  Lungs: Symmetrical Chest wall movement, Good air movement bilaterally, decrease breath sounds at bases, no wheezing  Heart: rate controlled. Positive A. Fib on telemetry, no rubs or gallops Abdomen: +ve B.Sounds, Abd Soft,Suprapubic tenderness,  No rebound - guarding or rigidity. Extremities: Diffuse anasarca, edema almost resolved; left ray amputation, with clean dressing no drainage . Data Review   CBC w Diff:  Lab Results  Component Value Date   WBC 5.8 11/25/2015   HGB 10.9 (L) 11/25/2015   HCT 34.6 (L) 11/25/2015   PLT 194 11/25/2015   LYMPHOPCT 13 10/28/2015   MONOPCT 6 10/28/2015   EOSPCT 0 10/28/2015   BASOPCT 0 10/28/2015    CMP:  Lab Results  Component Value Date    NA 134 (L) 11/25/2015   K 4.1 11/25/2015   CL 98 (L) 11/25/2015   CO2 26 11/25/2015   BUN 42 (H) 11/25/2015   CREATININE 5.75 (H) 11/25/2015   PROT 5.9 (L) 11/14/2015   ALBUMIN 2.5 (L) 11/25/2015   BILITOT 0.8 11/14/2015   ALKPHOS 116 11/14/2015   AST 20 11/14/2015   ALT 19 11/14/2015  .   Total Time in preparing paper work, data evaluation and todays exam - 35 minutes  Jefferey Lippmann M.D on 11/26/2015 at 12:12 PM  [facility]   Office  [phone]

## 2015-11-26 NOTE — Clinical Social Work Note (Signed)
RN Report Information [facility] Rm# [id number] Report# [id number]  Pt is ready for discharge today to [facility] via [facility]. Pt and daughter are aware and agreeable to discharge plan. Facility has received discharge summary and is ready to admit pt. RN will call report. CSW is signing off as no further needs identified.   Darden Dates, MSW, LCSW  Clinical Social Worker  [phone]

## 2015-11-26 NOTE — Progress Notes (Signed)
Nutrition Follow Up  DOCUMENTATION CODES:   Obesity unspecified  INTERVENTION:    Continue Boost Breeze po TID, each supplement provides 250 kcal and 9 grams of protein  NUTRITION DIAGNOSIS:   Increased nutrient needs related to wound healing as evidenced by estimated needs; ongoing  GOAL:   Patient will meet greater than or equal to 90% of their needs; met  MONITOR:   PO intake, Supplement acceptance, Weight trends, Labs, Skin, I & O's  ASSESSMENT:   75 y.o. 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  Patient presents to ED with c/o 'his toe is dead'.     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 PO intake improved at 75-100% per flowsheet records. Receiving Boost Breeze supplement TID. Disp: SNF placement.  Diet Order:  Diet renal/carb modified with fluid restriction Diet-HS Snack? Nothing; Room service appropriate? Yes; Fluid consistency: Thin  Skin:  Wound (see comment) (diabetic ulcer L great toe)  Last BM:  8/21  Height:   Ht Readings from Last 1 Encounters:  10/28/15 '5\' 9"'  (1.753 m)    Weight:   Wt Readings from Last 1 Encounters:  11/25/15 198 lb 10.2 oz (90.1 kg)    Ideal Body Weight:  72.7 kg  BMI:  Body mass index is 29.33 kg/m.  Estimated Nutritional Needs:   Kcal:  0071-2197  Protein:  105-125 grams  Fluid:  2-2.3 L/day  EDUCATION NEEDS:   No education needs identified at this time  Arthur Holms, RD, LDN Pager #: [phone] After-Hours Pager #: [phone]

## 2015-11-26 NOTE — Care Management Important Message (Signed)
Important Message  Patient Details  Name: Mark [name] MRN: [id number] Date of Birth: 09-May-1940   Medicare Important Message Given:  Yes    Loann Quill 11/26/2015, 10:17 AM

## 2015-11-26 NOTE — Progress Notes (Signed)
Occupational Therapy Treatment Patient Details Name: Mark [name] MRN: [id number] DOB: 20-Apr-1940 Today's Date: 11/26/2015    History of present illness 75 y.o. male s/p L great toe amputation. PMH significant HTN, afib on Coumadin, ESRD s/p kidney tranplant 12 years ago, and DM type II.HD M,W,F   OT comments  This 75 yo male admitted with above presents to acute OT with continued issues with being able to perform BADLs independently. He was reluctant to get up with me today (just back to bed, bottom was hurting, and he didn't know I was coming). I explained that we tried to see him yesterday but he was in dialysis and that was why we were seeing him today. He did agree to at least sit up on EOB and do grooming tasks (grooming, bed mobility,and sitting balance all were good). He will continue to benefit from acute OT with follow up OT at SNF.  Follow Up Recommendations  SNF    Equipment Recommendations   (TBD at next venue)       Precautions / Restrictions Precautions Precautions: Fall Precaution Comments: History of peripheral neuropathy. Pt states he can't really feel his feet. Required Braces or Orthoses: Other Brace/Splint Other Brace/Splint: Darco shoe--says he can't wear it when he is up on his feet (due to how much it throws his leg length off compared to his other foot even if he has a shoe on (I explained to him the importance of having it on when he is up on his feet) Restrictions Weight Bearing Restrictions: Yes RLE Weight Bearing: Weight bearing as tolerated LLE Weight Bearing: Partial weight bearing LLE Partial Weight Bearing Percentage or Pounds: through heel only with Darco shoe donned       Mobility Bed Mobility Overal bed mobility: Needs Assistance Bed Mobility: Rolling;Sidelying to Sit;Sit to Supine Rolling: Supervision (with rail) Sidelying to sit: Supervision;HOB elevated (with rail)   Sit to supine: Supervision   General bed mobility comments: with bed  in [place] pt able to pull himself up in bed with headboard     Balance Overall balance assessment: Needs assistance Sitting-balance support: No upper extremity supported;Feet supported Sitting balance-Leahy Scale: Good                             ADL Overall ADL's : Needs assistance/impaired     Grooming: Oral care;Brushing hair;Sitting (EOB)                                                  Cognition   Behavior During Therapy: WFL for tasks assessed/performed Overall Cognitive Status: Within Functional Limits for tasks assessed                                    Pertinent Vitals/ Pain       Pain Assessment: 0-10 Pain Score: 7  Pain Location: butt Pain Descriptors / Indicators: Sore;Grimacing;Discomfort Pain Intervention(s): Monitored during session (RNs came into room as I was finishing up with pt to assess his skin (I told them I was glad they did because pt's bottom is not good)         Frequency Min 2X/week     Progress Toward Goals  OT Goals(current goals can now  be found in the care plan section)  Progress towards OT goals:  (pt stated he had just got back to bed--he did agree to sit up on EOB and perform 2 grooming activties, but did not want to get up again due to had already been in recliner and his bottom was really sore.)     Plan Discharge plan remains appropriate       End of Session     Activity Tolerance Patient tolerated treatment well (did c/o of buttock pain)   Patient Left in bed (with RNs assessing his bottom and they agreed they would put everything back that he needed when they finished with him)   Nurse Communication  (his bottom looks bad)        Time: [id number] OT Time Calculation (min): 24 min  Charges: OT General Charges $OT Visit: 1 Procedure OT Treatments $Self Care/Home Management : 23-37 mins  Almon Register [id number] 11/26/2015, 12:05 PM

## 2015-11-26 NOTE — Progress Notes (Signed)
PT Cancellation Note  Patient Details Name: Mark [name] MRN: [id number] DOB: 1940-10-06   Cancelled Treatment:    Reason Eval/Treat Not Completed: Other (comment). Pt initially working with OT, then eating lunch, and then trying to urinate. Will try again tomorrow.  Abril Cappiello 11/26/2015, 3:25 PM [facility] PT [phone]

## 2015-11-27 DIAGNOSIS — I509 Heart failure, unspecified: Secondary | ICD-10-CM | POA: Diagnosis not present

## 2015-11-27 DIAGNOSIS — N2581 Secondary hyperparathyroidism of renal origin: Secondary | ICD-10-CM | POA: Diagnosis not present

## 2015-11-27 DIAGNOSIS — E1140 Type 2 diabetes mellitus with diabetic neuropathy, unspecified: Secondary | ICD-10-CM | POA: Diagnosis not present

## 2015-11-27 DIAGNOSIS — I4891 Unspecified atrial fibrillation: Secondary | ICD-10-CM | POA: Diagnosis not present

## 2015-11-27 DIAGNOSIS — N186 End stage renal disease: Secondary | ICD-10-CM | POA: Diagnosis not present

## 2015-11-29 DIAGNOSIS — N186 End stage renal disease: Secondary | ICD-10-CM | POA: Diagnosis not present

## 2015-11-29 DIAGNOSIS — N2581 Secondary hyperparathyroidism of renal origin: Secondary | ICD-10-CM | POA: Diagnosis not present

## 2015-12-02 DIAGNOSIS — N2581 Secondary hyperparathyroidism of renal origin: Secondary | ICD-10-CM | POA: Diagnosis not present

## 2015-12-02 DIAGNOSIS — N186 End stage renal disease: Secondary | ICD-10-CM | POA: Diagnosis not present

## 2015-12-03 DIAGNOSIS — L89610 Pressure ulcer of right heel, unstageable: Secondary | ICD-10-CM | POA: Diagnosis not present

## 2015-12-03 DIAGNOSIS — T8189XD Other complications of procedures, not elsewhere classified, subsequent encounter: Secondary | ICD-10-CM | POA: Diagnosis not present

## 2015-12-03 DIAGNOSIS — L89152 Pressure ulcer of sacral region, stage 2: Secondary | ICD-10-CM | POA: Diagnosis not present

## 2015-12-04 DIAGNOSIS — N2581 Secondary hyperparathyroidism of renal origin: Secondary | ICD-10-CM | POA: Diagnosis not present

## 2015-12-04 DIAGNOSIS — N186 End stage renal disease: Secondary | ICD-10-CM | POA: Diagnosis not present

## 2015-12-05 DIAGNOSIS — E1129 Type 2 diabetes mellitus with other diabetic kidney complication: Secondary | ICD-10-CM | POA: Diagnosis not present

## 2015-12-05 DIAGNOSIS — N186 End stage renal disease: Secondary | ICD-10-CM | POA: Diagnosis not present

## 2015-12-05 DIAGNOSIS — Z992 Dependence on renal dialysis: Secondary | ICD-10-CM | POA: Diagnosis not present

## 2015-12-06 DIAGNOSIS — N170 Acute kidney failure with tubular necrosis: Secondary | ICD-10-CM | POA: Diagnosis not present

## 2015-12-06 DIAGNOSIS — N2581 Secondary hyperparathyroidism of renal origin: Secondary | ICD-10-CM | POA: Diagnosis not present

## 2015-12-06 DIAGNOSIS — E1129 Type 2 diabetes mellitus with other diabetic kidney complication: Secondary | ICD-10-CM | POA: Diagnosis not present

## 2015-12-06 LAB — FUNGUS CULTURE RESULT

## 2015-12-06 LAB — FUNGUS CULTURE WITH STAIN

## 2015-12-06 LAB — FUNGAL ORGANISM REFLEX

## 2015-12-09 DIAGNOSIS — N2581 Secondary hyperparathyroidism of renal origin: Secondary | ICD-10-CM | POA: Diagnosis not present

## 2015-12-09 DIAGNOSIS — E1129 Type 2 diabetes mellitus with other diabetic kidney complication: Secondary | ICD-10-CM | POA: Diagnosis not present

## 2015-12-09 DIAGNOSIS — N170 Acute kidney failure with tubular necrosis: Secondary | ICD-10-CM | POA: Diagnosis not present

## 2015-12-10 DIAGNOSIS — T8189XD Other complications of procedures, not elsewhere classified, subsequent encounter: Secondary | ICD-10-CM | POA: Diagnosis not present

## 2015-12-10 DIAGNOSIS — L89152 Pressure ulcer of sacral region, stage 2: Secondary | ICD-10-CM | POA: Diagnosis not present

## 2015-12-10 DIAGNOSIS — L89610 Pressure ulcer of right heel, unstageable: Secondary | ICD-10-CM | POA: Diagnosis not present

## 2015-12-11 DIAGNOSIS — N170 Acute kidney failure with tubular necrosis: Secondary | ICD-10-CM | POA: Diagnosis not present

## 2015-12-11 DIAGNOSIS — N2581 Secondary hyperparathyroidism of renal origin: Secondary | ICD-10-CM | POA: Diagnosis not present

## 2015-12-11 DIAGNOSIS — E1129 Type 2 diabetes mellitus with other diabetic kidney complication: Secondary | ICD-10-CM | POA: Diagnosis not present

## 2015-12-13 ENCOUNTER — Encounter: Payer: Self-pay | Admitting: Family

## 2015-12-13 DIAGNOSIS — E1129 Type 2 diabetes mellitus with other diabetic kidney complication: Secondary | ICD-10-CM | POA: Diagnosis not present

## 2015-12-13 DIAGNOSIS — N2581 Secondary hyperparathyroidism of renal origin: Secondary | ICD-10-CM | POA: Diagnosis not present

## 2015-12-13 DIAGNOSIS — N170 Acute kidney failure with tubular necrosis: Secondary | ICD-10-CM | POA: Diagnosis not present

## 2015-12-16 ENCOUNTER — Ambulatory Visit (HOSPITAL_COMMUNITY): Payer: Medicare Other

## 2015-12-16 ENCOUNTER — Encounter: Payer: Medicare Other | Admitting: Surgery

## 2015-12-16 DIAGNOSIS — E1129 Type 2 diabetes mellitus with other diabetic kidney complication: Secondary | ICD-10-CM | POA: Diagnosis not present

## 2015-12-16 DIAGNOSIS — N170 Acute kidney failure with tubular necrosis: Secondary | ICD-10-CM | POA: Diagnosis not present

## 2015-12-16 DIAGNOSIS — N2581 Secondary hyperparathyroidism of renal origin: Secondary | ICD-10-CM | POA: Diagnosis not present

## 2015-12-17 DIAGNOSIS — R34 Anuria and oliguria: Secondary | ICD-10-CM | POA: Diagnosis not present

## 2015-12-17 DIAGNOSIS — N185 Chronic kidney disease, stage 5: Secondary | ICD-10-CM | POA: Diagnosis not present

## 2015-12-17 DIAGNOSIS — Z940 Kidney transplant status: Secondary | ICD-10-CM | POA: Diagnosis not present

## 2015-12-18 DIAGNOSIS — N186 End stage renal disease: Secondary | ICD-10-CM | POA: Diagnosis not present

## 2015-12-18 DIAGNOSIS — N2581 Secondary hyperparathyroidism of renal origin: Secondary | ICD-10-CM | POA: Diagnosis not present

## 2015-12-19 ENCOUNTER — Encounter: Payer: Self-pay | Admitting: Family

## 2015-12-19 ENCOUNTER — Ambulatory Visit (INDEPENDENT_AMBULATORY_CARE_PROVIDER_SITE_OTHER)
Admission: RE | Admit: 2015-12-19 | Discharge: 2015-12-19 | Disposition: A | Discharge status: Home / Self Care | Payer: No Typology Code available for payment source | Source: Ambulatory Visit | Attending: Vascular Surgery | Admitting: Vascular Surgery

## 2015-12-19 ENCOUNTER — Other Ambulatory Visit: Payer: Self-pay | Admitting: *Deleted

## 2015-12-19 ENCOUNTER — Ambulatory Visit (HOSPITAL_COMMUNITY)
Admission: RE | Admit: 2015-12-19 | Discharge: 2015-12-19 | Disposition: A | Discharge status: Home / Self Care | Payer: No Typology Code available for payment source | Source: Ambulatory Visit | Attending: Surgery | Admitting: Surgery

## 2015-12-19 ENCOUNTER — Ambulatory Visit (INDEPENDENT_AMBULATORY_CARE_PROVIDER_SITE_OTHER): Payer: Medicare Other | Admitting: Family

## 2015-12-19 ENCOUNTER — Telehealth: Payer: Self-pay | Admitting: *Deleted

## 2015-12-19 VITALS — BP 130/74 | HR 85 | Temp 96.7°F | Resp 18 | Ht 69.0 in | Wt 190.0 lb

## 2015-12-19 DIAGNOSIS — M869 Osteomyelitis, unspecified: Secondary | ICD-10-CM

## 2015-12-19 DIAGNOSIS — L089 Local infection of the skin and subcutaneous tissue, unspecified: Secondary | ICD-10-CM

## 2015-12-19 DIAGNOSIS — Z89412 Acquired absence of left great toe: Secondary | ICD-10-CM

## 2015-12-19 DIAGNOSIS — I739 Peripheral vascular disease, unspecified: Secondary | ICD-10-CM

## 2015-12-19 DIAGNOSIS — N186 End stage renal disease: Secondary | ICD-10-CM | POA: Diagnosis not present

## 2015-12-19 DIAGNOSIS — T148XXA Other injury of unspecified body region, initial encounter: Principal | ICD-10-CM

## 2015-12-19 DIAGNOSIS — I779 Disorder of arteries and arterioles, unspecified: Secondary | ICD-10-CM

## 2015-12-19 DIAGNOSIS — R0989 Other specified symptoms and signs involving the circulatory and respiratory systems: Secondary | ICD-10-CM

## 2015-12-19 DIAGNOSIS — T148 Other injury of unspecified body region: Secondary | ICD-10-CM

## 2015-12-19 DIAGNOSIS — Z992 Dependence on renal dialysis: Secondary | ICD-10-CM

## 2015-12-19 DIAGNOSIS — I770 Arteriovenous fistula, acquired: Secondary | ICD-10-CM

## 2015-12-19 MED ORDER — CIPROFLOXACIN HCL 500 MG PO TABS: 500.0000 mg | ORAL_TABLET | Freq: Two times a day (BID) | ORAL | 0 refills | Status: DC

## 2015-12-19 NOTE — Patient Instructions (Signed)
Peripheral Vascular Disease Peripheral vascular disease (PVD) is a disease of the blood vessels that are not part of your heart and brain. A simple term for PVD is poor circulation. In most cases, PVD narrows the blood vessels that carry blood from your heart to the rest of your body. This can result in a decreased supply of blood to your arms, legs, and internal organs, like your stomach or kidneys. However, it most often affects a person's lower legs and feet. There are two types of PVD.  Organic PVD. This is the more common type. It is caused by damage to the structure of blood vessels.  Functional PVD. This is caused by conditions that make blood vessels contract and tighten (spasm). Without treatment, PVD tends to get worse over time. PVD can also lead to acute ischemic limb. This is when an arm or limb suddenly has trouble getting enough blood. This is a medical emergency. CAUSES Each type of PVD has many different causes. The most common cause of PVD is buildup of a fatty material (plaque) inside of your arteries (atherosclerosis). Small amounts of plaque can break off from the walls of the blood vessels and become lodged in a smaller artery. This blocks blood flow and can cause acute ischemic limb. Other common causes of PVD include:  Blood clots that form inside of blood vessels.  Injuries to blood vessels.  Diseases that cause inflammation of blood vessels or cause blood vessel spasms.  Health behaviors and health history that increase your risk of developing PVD. RISK FACTORS  You may have a greater risk of PVD if you:  Have a family history of PVD.  Have certain medical conditions, including:  High cholesterol.  Diabetes.  High blood pressure (hypertension).  Coronary heart disease.  Past problems with blood clots.  Past injury, such as burns or a broken bone. These may have damaged blood vessels in your limbs.  Buerger disease. This is caused by inflamed blood  vessels in your hands and feet.  Some forms of arthritis.  Rare birth defects that affect the arteries in your legs.  Use tobacco.  Do not get enough exercise.  Are obese.  Are age 50 or older. SIGNS AND SYMPTOMS  PVD may cause many different symptoms. Your symptoms depend on what part of your body is not getting enough blood. Some common signs and symptoms include:  Cramps in your lower legs. This may be a symptom of poor leg circulation (claudication).  Pain and weakness in your legs while you are physically active that goes away when you rest (intermittent claudication).  Leg pain when at rest.  Leg numbness, tingling, or weakness.  Coldness in a leg or foot, especially when compared with the other leg.  Skin or hair changes. These can include:  Hair loss.  Shiny skin.  Pale or bluish skin.  Thick toenails.  Inability to get or maintain an erection (erectile dysfunction). People with PVD are more prone to developing ulcers and sores on their toes, feet, or legs. These may take longer than normal to heal. DIAGNOSIS Your health care provider may diagnose PVD from your signs and symptoms. The health care provider will also do a physical exam. You may have tests to find out what is causing your PVD and determine its severity. Tests may include:  Blood pressure recordings from your arms and legs and measurements of the strength of your pulses (pulse volume recordings).  Imaging studies using sound waves to take pictures of   the blood flow through your blood vessels (Doppler ultrasound).  Injecting a dye into your blood vessels before having imaging studies using:  X-rays (angiogram or arteriogram).  Computer-generated X-rays (CT angiogram).  A powerful electromagnetic field and a computer (magnetic resonance angiogram or MRA). TREATMENT Treatment for PVD depends on the cause of your condition and the severity of your symptoms. It also depends on your age. Underlying  causes need to be treated and controlled. These include long-lasting (chronic) conditions, such as diabetes, high cholesterol, and high blood pressure. You may need to first try making lifestyle changes and taking medicines. Surgery may be needed if these do not work. Lifestyle changes may include:  Quitting smoking.  Exercising regularly.  Following a low-fat, low-cholesterol diet. Medicines may include:  Blood thinners to prevent blood clots.  Medicines to improve blood flow.  Medicines to improve your blood cholesterol levels. Surgical procedures may include:  A procedure that uses an inflated balloon to open a blocked artery and improve blood flow (angioplasty).  A procedure to put in a tube (stent) to keep a blocked artery open (stent implant).  Surgery to reroute blood flow around a blocked artery (peripheral bypass surgery).  Surgery to remove dead tissue from an infected wound on the affected limb.  Amputation. This is surgical removal of the affected limb. This may be necessary in cases of acute ischemic limb that are not improved through medical or surgical treatments. HOME CARE INSTRUCTIONS  Take medicines only as directed by your health care provider.  Do not use any tobacco products, including cigarettes, chewing tobacco, or electronic cigarettes. If you need help quitting, ask your health care provider.  Lose weight if you are overweight, and maintain a healthy weight as directed by your health care provider.  Eat a diet that is low in fat and cholesterol. If you need help, ask your health care provider.  Exercise regularly. Ask your health care provider to suggest some good activities for you.  Use compression stockings or other mechanical devices as directed by your health care provider.  Take good care of your feet.  Wear comfortable shoes that fit well.  Check your feet often for any cuts or sores. SEEK MEDICAL CARE IF:  You have cramps in your legs  while walking.  You have leg pain when you are at rest.  You have coldness in a leg or foot.  Your skin changes.  You have erectile dysfunction.  You have cuts or sores on your feet that are not healing. SEEK IMMEDIATE MEDICAL CARE IF:  Your arm or leg turns cold and blue.  Your arms or legs become red, warm, swollen, painful, or numb.  You have chest pain or trouble breathing.  You suddenly have weakness in your face, arm, or leg.  You become very confused or lose the ability to speak.  You suddenly have a very bad headache or lose your vision.   This information is not intended to replace advice given to you by your health care provider. Make sure you discuss any questions you have with your health care provider.   Document Released: 04/30/2004 Document Revised: 04/13/2014 Document Reviewed: 08/31/2013 Elsevier Interactive Patient Education 2016 [employer]vier Inc.  

## 2015-12-19 NOTE — Telephone Encounter (Signed)
[facility] in [place]  I spoke to Morton to give verbal wound care orders from [facility] Nickel  NP. Nurse to apply Santyl to the left great toe amputation site.  Apply silvadene to the right great toe ulcer and to the right shin and the large shallow area surrounding.

## 2015-12-19 NOTE — Progress Notes (Signed)
Postoperative Access Visit   History of Present Illness  Mark [name] is a 75 y.o. year old male who is s/p Right IJ tunneled dialysis catheter placement with US guidance and leftbrachiocephalic arteriovenous fistula placement on 11/19/15 by Dr. Donzetta Matters for ESRD, need for HD. He is also s/p Amputation leftt first toe with resection of metatarsal head on 11/06/15 by Dr. Oneida Alar for osteomyelitis left first toe. Patient recently had revascularization of his right lower extremity. He had a gangrenous right first toe. He returns today for 4 weeks follow up.  He dialyzes M-W-F currently; he states after being discharged to home about 9/22/ or 12/28/15, he will be dialyzing at a different center.  Pt has insulin dependent DM. His A1C on 10/29/15 was 8.2 (review of records).   He currently resides at [facility].   The left arm AVF incision is well healed, AVF has a palpable thrill and audible bruit. The patient notes no steal symptoms in his left hand/forearm.    Pt states that he does not know how the large shallow wound on his right shin occurred, states this is the only wound that is painful, he states the pain is moderate. He also states the ulcer on his right great toe is worsening, states the left great toe amputation site is worsening.    For VQI Use Only  PRE-ADM LIVING: Nursing home  AMB STATUS: Wheelchair  Physical Examination Vitals:   12/19/15 0933  BP: 130/74  Pulse: 85  Resp: 18  Temp: (!) 96.7 F (35.9 C)  SpO2: 97%  Weight: 190 lb (86.2 kg)  Height: 5\' 9"  (1.753 m)   Body mass index is 28.06 kg/m.  Left arm AVF incision is well healed, hand grip is 5/5, sensation in digits is intact, palpable thrill, bruit can be auscultated. Right radial pulse is 2+ palpable, left is 1+ palpable.   Medical Decision Making  Mark [name] is a 75 y.o. year old male who presents s/p right IJ tunneled dialysis catheter placement with US guidance and  leftbrachiocephalic arteriovenous fistula placement on 11/19/15 by Dr. Donzetta Matters for ESRD, need for HD. He is also s/p Amputation left first toe with resection of metatarsal head on 11/06/15 by Dr. Oneida Alar for osteomyelitis left first toe. Patient recently had revascularization of his right lower extremity. He had a gangrenous right first toe.  Left great toe amputation site has a mild degree of dark necrotic tissue on the periphery and yellow necrotic tissue in the center, no surrounding erythema.  On the right lower extremity: there is a large shallow erythematous wound that extends from about 3 inches below the knee to his ankle, anterior aspect.  Right great toe has an ulcer or wound with a yellow necrotic center, surrounding erythema.   I was unable to detect bilateral PT, DP, and peroneal pulses by Doppler, discussed with Dr. Oneida Alar. Unable to perform ABI's due to wound on right lower leg. Bilateral LE arterial duplex demonstrates <40% diameter reduction throughout with irregular plaque. Triphasic waveforms noted throughout the lower extremity arterial system excluding the bilateral posterior tibial arteries which demonstrate absence of flow.  There are no prior exams for comparison.   Pt states he is scheduled to go home from the nursing facility about 9/22/ or 12/28/15. Will advise nursing facility and [facility] to perform daily Santyl dressing changes on left great toe amputations site, and daily Silvadene dressing changes to right great toe ulcer and large right shin shallow wound from  unknown etiology.  He has a daughter that lives with him. Ciprofloxacin 500 mg po bid, disp #20, 0 refills, sent to his [facility] pharmacy in [place], nursing facility to start Cipro however.  I advised the NH to closely monitor his INR since the Cipro may increase the anticoagulant effect.  Follow up with Dr. Trula Slade in 2 weeks for check of wounds in both lower extremities, and evaluation of left arm AVF. He was taken  from the office by the NH driver.  Thank you for allowing [place] to participate in this patient's care.  Sundiata Ferrick, Sharmon Leyden, RN, MSN, FNP-C [facility] Office: [phone]  12/19/2015, 9:55 AM  Clinic MD: Oneida Alar

## 2015-12-20 DIAGNOSIS — N2581 Secondary hyperparathyroidism of renal origin: Secondary | ICD-10-CM | POA: Diagnosis not present

## 2015-12-20 DIAGNOSIS — N170 Acute kidney failure with tubular necrosis: Secondary | ICD-10-CM | POA: Diagnosis not present

## 2015-12-20 DIAGNOSIS — E1129 Type 2 diabetes mellitus with other diabetic kidney complication: Secondary | ICD-10-CM | POA: Diagnosis not present

## 2015-12-20 LAB — ACID FAST CULTURE WITH REFLEXED SENSITIVITIES

## 2015-12-20 LAB — ACID FAST CULTURE WITH REFLEXED SENSITIVITIES (MYCOBACTERIA): Acid Fast Culture: NEGATIVE

## 2015-12-22 DIAGNOSIS — L02419 Cutaneous abscess of limb, unspecified: Secondary | ICD-10-CM | POA: Diagnosis not present

## 2015-12-22 DIAGNOSIS — R339 Retention of urine, unspecified: Secondary | ICD-10-CM | POA: Diagnosis not present

## 2015-12-22 DIAGNOSIS — I509 Heart failure, unspecified: Secondary | ICD-10-CM | POA: Diagnosis not present

## 2015-12-22 DIAGNOSIS — N186 End stage renal disease: Secondary | ICD-10-CM | POA: Diagnosis not present

## 2015-12-22 DIAGNOSIS — L309 Dermatitis, unspecified: Secondary | ICD-10-CM | POA: Diagnosis not present

## 2015-12-22 DIAGNOSIS — I4891 Unspecified atrial fibrillation: Secondary | ICD-10-CM | POA: Diagnosis not present

## 2015-12-23 DIAGNOSIS — E1129 Type 2 diabetes mellitus with other diabetic kidney complication: Secondary | ICD-10-CM | POA: Diagnosis not present

## 2015-12-23 DIAGNOSIS — N170 Acute kidney failure with tubular necrosis: Secondary | ICD-10-CM | POA: Diagnosis not present

## 2015-12-23 DIAGNOSIS — N2581 Secondary hyperparathyroidism of renal origin: Secondary | ICD-10-CM | POA: Diagnosis not present

## 2015-12-24 ENCOUNTER — Encounter: Payer: Self-pay | Admitting: Surgery

## 2015-12-24 DIAGNOSIS — L89610 Pressure ulcer of right heel, unstageable: Secondary | ICD-10-CM | POA: Diagnosis not present

## 2015-12-24 DIAGNOSIS — L97812 Non-pressure chronic ulcer of other part of right lower leg with fat layer exposed: Secondary | ICD-10-CM | POA: Diagnosis not present

## 2015-12-24 DIAGNOSIS — T8189XD Other complications of procedures, not elsewhere classified, subsequent encounter: Secondary | ICD-10-CM | POA: Diagnosis not present

## 2015-12-24 DIAGNOSIS — L89152 Pressure ulcer of sacral region, stage 2: Secondary | ICD-10-CM | POA: Diagnosis not present

## 2015-12-24 DIAGNOSIS — L89512 Pressure ulcer of right ankle, stage 2: Secondary | ICD-10-CM | POA: Diagnosis not present

## 2015-12-25 DIAGNOSIS — N2581 Secondary hyperparathyroidism of renal origin: Secondary | ICD-10-CM | POA: Diagnosis not present

## 2015-12-25 DIAGNOSIS — N170 Acute kidney failure with tubular necrosis: Secondary | ICD-10-CM | POA: Diagnosis not present

## 2015-12-25 DIAGNOSIS — E1129 Type 2 diabetes mellitus with other diabetic kidney complication: Secondary | ICD-10-CM | POA: Diagnosis not present

## 2015-12-27 DIAGNOSIS — N170 Acute kidney failure with tubular necrosis: Secondary | ICD-10-CM | POA: Diagnosis not present

## 2015-12-27 DIAGNOSIS — E1129 Type 2 diabetes mellitus with other diabetic kidney complication: Secondary | ICD-10-CM | POA: Diagnosis not present

## 2015-12-27 DIAGNOSIS — N2581 Secondary hyperparathyroidism of renal origin: Secondary | ICD-10-CM | POA: Diagnosis not present

## 2015-12-30 ENCOUNTER — Encounter: Payer: Self-pay | Admitting: Surgery

## 2015-12-30 ENCOUNTER — Ambulatory Visit (INDEPENDENT_AMBULATORY_CARE_PROVIDER_SITE_OTHER): Payer: Medicare Other | Admitting: Surgery

## 2015-12-30 VITALS — BP 123/73 | HR 99 | Temp 98.1°F | Resp 22 | Ht 69.0 in | Wt 190.0 lb

## 2015-12-30 DIAGNOSIS — N170 Acute kidney failure with tubular necrosis: Secondary | ICD-10-CM | POA: Diagnosis not present

## 2015-12-30 DIAGNOSIS — I7025 Atherosclerosis of native arteries of other extremities with ulceration: Secondary | ICD-10-CM

## 2015-12-30 DIAGNOSIS — E1129 Type 2 diabetes mellitus with other diabetic kidney complication: Secondary | ICD-10-CM | POA: Diagnosis not present

## 2015-12-30 DIAGNOSIS — N2581 Secondary hyperparathyroidism of renal origin: Secondary | ICD-10-CM | POA: Diagnosis not present

## 2015-12-30 NOTE — Progress Notes (Signed)
Vascular and Vein Specialist of [facility]  Patient name: Mark [name] MRN: [id number] DOB: 02/07/1941 Sex: male  REASON FOR VISIT: follow up  HPI: Mark [name] is a 75 y.o. male who is back today for follow-up.  He initially presented to the hospital and July 2017 with gangrenous left toe ulcer which developed after bumping it at the beginning of the summer.  It had progressively gotten worse.  He underwent angiography on 11/05/2015 and had successful atherectomy and drug coated balloon angioplasty of a left popliteal stenosis and angioplasty of a left anterior tibial artery that was occluded.  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  Patient has a history of renal transplant.  Unfortunately after the arteriogram this led to renal failure now requiring dialysis.  He has undergone fistula creation which at his last visit showed a widely patent fistula by ultrasound  He continues to reside at the nursing home with plans for going home in approximately 2 weeks  Past Medical History:  Diagnosis Date  . Atrial fibrillation ([place])   . Diabetes ([place])   . Gout   . Hypertension   . Hypothyroidism   . Renal disorder    kidney transplant   . Ulcer of other part of foot 12/15/2013    Family History  Problem Relation Age of Onset  . Bradycardia Mother   . Stroke Father     SOCIAL HISTORY: Social History  Substance Use Topics  . Smoking status: Never Smoker  . Smokeless tobacco: Never Used  . Alcohol use No    Allergies  Allergen Reactions  . Tape Other (See Comments)    SKIN WILL TEAR!!    Current Outpatient Prescriptions  Medication Sig Dispense Refill  . acetaminophen (TYLENOL) 500 MG tablet Take 500 mg by mouth every 6 (six) hours as needed for mild pain or moderate pain.    Marland Kitchen allopurinol (ZYLOPRIM) 300 MG tablet Take 150 mg by mouth in the morning 15 tablet 5  .  collagenase (SANTYL) ointment Apply 1 application topically daily.    . feeding supplement (BOOST / RESOURCE BREEZE) LIQD Take 1 Container by mouth 3 (three) times daily with meals.  0  . fenofibrate micronized (LOFIBRA) 134 MG capsule TAKE ONE CAPSULE EVERY DAY WITH A MEAL (Patient taking differently: Take 1 capsule by mouth in the morning on Mon/Wed/Fri) 90 capsule 1  . HYDROcodone-acetaminophen (NORCO/VICODIN) 5-325 MG tablet Take 1-2 tablets by mouth every 4 (four) hours as needed for moderate pain. 30 tablet 0  . insulin aspart (NOVOLOG) 100 UNIT/ML injection Inject 0-15 Units into the skin 3 (three) times daily with meals. 10 mL 11  . insulin glargine (LANTUS) 100 UNIT/ML injection Inject 0.26 mLs (26 Units total) into the skin at bedtime. 10 mL 11  . ketoconazole (NIZORAL) 2 % shampoo Apply 1 application topically 2 (two) times a week.     . levalbuterol (XOPENEX) 1.25 MG/0.5ML nebulizer solution Take 1.25 mg by nebulization every 6 (six) hours as needed for wheezing or shortness of breath. 1 each 12  . metoprolol succinate (TOPROL-XL) 50 MG 24 hr tablet Take 50 mg by mouth daily.     . multivitamin (RENA-VIT) TABS tablet Take 1 tablet by mouth at bedtime.  0  . mupirocin ointment (BACTROBAN) 2 % Apply 1 application topically daily as needed.   0  . pantoprazole (PROTONIX) 40 MG tablet Take 1 tablet (40 mg total) by mouth  2 (two) times daily.    . predniSONE (DELTASONE) 5 MG tablet Take 5 mg by mouth daily.  0  . rosuvastatin (CRESTOR) 10 MG tablet Take 10 mg by mouth every evening.    . tacrolimus (PROGRAF) 0.5 MG capsule Take 0.5 mg by mouth 2 (two) times daily.    . tamsulosin (FLOMAX) 0.4 MG CAPS capsule Take 1 capsule (0.4 mg total) by mouth daily after supper. 30 capsule   . calcitRIOL (ROCALTROL) 0.5 MCG capsule Take one by mouth daily    . ciprofloxacin (CIPRO) 500 MG tablet Take 1 tablet (500 mg total) by mouth 2 (two) times daily. (Patient not taking: Reported on 12/30/2015) 20  tablet 0  . levothyroxine (SYNTHROID, LEVOTHROID) 137 MCG tablet Take 137 mcg by mouth daily before breakfast.    . nystatin (MYCOSTATIN/NYSTOP) 100000 UNIT/GM POWD Apply topically daily as needed.     . warfarin (COUMADIN) 5 MG tablet Take 0.5-1 tablets (2.5-5 mg total) by mouth See admin instructions. 5 mg in the evening on Sun/Mon/Wed/Fri/Sat and 2.5 mg on Tues/Thurs (Patient not taking: Reported on 12/30/2015)     No current facility-administered medications for this visit.     REVIEW OF SYSTEMS:  [X]  denotes positive finding, [ ]  denotes negative finding Cardiac  Comments:  Chest pain or chest pressure:    Shortness of breath upon exertion:    Short of breath when lying flat:    Irregular heart rhythm:        Vascular    Pain in calf, thigh, or hip brought on by ambulation:    Pain in feet at night that wakes you up from your sleep:     Blood clot in your veins:    Leg swelling:         Pulmonary    Oxygen at home:    Productive cough:     Wheezing:         Neurologic    Sudden weakness in arms or legs:     Sudden numbness in arms or legs:     Sudden onset of difficulty speaking or slurred speech:    Temporary loss of vision in one eye:     Problems with dizziness:         Gastrointestinal    Blood in stool:     Vomited blood:         Genitourinary    Burning when urinating:     Blood in urine:        Psychiatric    Major depression:         Hematologic    Bleeding problems:    Problems with blood clotting too easily:        Skin    Rashes or ulcers:        Constitutional    Fever or chills:      PHYSICAL EXAM: Vitals:   12/30/15 0929  BP: 123/73  Pulse: 99  Resp: (!) 22  Temp: 98.1 F (36.7 C)  TempSrc: Oral  SpO2: 100%  Weight: 190 lb (86.2 kg)  Height: 5\' 9"  (1.753 m)    GENERAL: The patient is a well-nourished male, in no acute distress. The vital signs are documented above. CARDIAC: There is a regular rate and rhythm.  VASCULAR: Pedal  pulses are not palpable SKIN: Left great toe amputation site remains open.  There does appear to be a granulation tissue base forming. -Right great toe with dry eschar anteriorly no erythema -Right medial  calf with open wound and mild edema DATA:  None  MEDICAL ISSUES: Fistula: The patient's fistula is maturing nicely.  It should be ready for use an approximate 6 weeks.  Bilateral lower extremity ulcers: I have recommended placing Santillo on the left great toe amputation site and Silvadene on the right great toe amputation site.  He will use Xeroform and a Ace wrap to help with the ulcer on the right leg.  I am making him a referral to the wound center.  Based on his vascular lab studies, he has appropriate blood flow to heal his wounds.  I have scheduled follow-up in 6 weeks with a repeat duplex to evaluate his intervention.    Annamarie Major, MD [facility] Specialists of [facility] [phone] Pager [phone]

## 2015-12-31 DIAGNOSIS — L97812 Non-pressure chronic ulcer of other part of right lower leg with fat layer exposed: Secondary | ICD-10-CM | POA: Diagnosis not present

## 2015-12-31 DIAGNOSIS — L89610 Pressure ulcer of right heel, unstageable: Secondary | ICD-10-CM | POA: Diagnosis not present

## 2015-12-31 DIAGNOSIS — T8189XD Other complications of procedures, not elsewhere classified, subsequent encounter: Secondary | ICD-10-CM | POA: Diagnosis not present

## 2015-12-31 DIAGNOSIS — L89152 Pressure ulcer of sacral region, stage 2: Secondary | ICD-10-CM | POA: Diagnosis not present

## 2015-12-31 DIAGNOSIS — L89512 Pressure ulcer of right ankle, stage 2: Secondary | ICD-10-CM | POA: Diagnosis not present

## 2016-01-01 DIAGNOSIS — E1129 Type 2 diabetes mellitus with other diabetic kidney complication: Secondary | ICD-10-CM | POA: Diagnosis not present

## 2016-01-01 DIAGNOSIS — N2581 Secondary hyperparathyroidism of renal origin: Secondary | ICD-10-CM | POA: Diagnosis not present

## 2016-01-01 DIAGNOSIS — N170 Acute kidney failure with tubular necrosis: Secondary | ICD-10-CM | POA: Diagnosis not present

## 2016-01-03 DIAGNOSIS — N170 Acute kidney failure with tubular necrosis: Secondary | ICD-10-CM | POA: Diagnosis not present

## 2016-01-03 DIAGNOSIS — E1129 Type 2 diabetes mellitus with other diabetic kidney complication: Secondary | ICD-10-CM | POA: Diagnosis not present

## 2016-01-03 DIAGNOSIS — N2581 Secondary hyperparathyroidism of renal origin: Secondary | ICD-10-CM | POA: Diagnosis not present

## 2016-01-04 DIAGNOSIS — Z992 Dependence on renal dialysis: Secondary | ICD-10-CM | POA: Diagnosis not present

## 2016-01-04 DIAGNOSIS — E1129 Type 2 diabetes mellitus with other diabetic kidney complication: Secondary | ICD-10-CM | POA: Diagnosis not present

## 2016-01-04 DIAGNOSIS — N186 End stage renal disease: Secondary | ICD-10-CM | POA: Diagnosis not present

## 2016-01-06 DIAGNOSIS — N2581 Secondary hyperparathyroidism of renal origin: Secondary | ICD-10-CM | POA: Diagnosis not present

## 2016-01-06 DIAGNOSIS — N170 Acute kidney failure with tubular necrosis: Secondary | ICD-10-CM | POA: Diagnosis not present

## 2016-01-07 ENCOUNTER — Ambulatory Visit: Payer: Medicare Other | Admitting: Physician Assistant

## 2016-01-07 DIAGNOSIS — L89152 Pressure ulcer of sacral region, stage 2: Secondary | ICD-10-CM | POA: Diagnosis not present

## 2016-01-07 DIAGNOSIS — L89512 Pressure ulcer of right ankle, stage 2: Secondary | ICD-10-CM | POA: Diagnosis not present

## 2016-01-07 DIAGNOSIS — L97812 Non-pressure chronic ulcer of other part of right lower leg with fat layer exposed: Secondary | ICD-10-CM | POA: Diagnosis not present

## 2016-01-07 DIAGNOSIS — N390 Urinary tract infection, site not specified: Secondary | ICD-10-CM | POA: Diagnosis not present

## 2016-01-07 DIAGNOSIS — L89610 Pressure ulcer of right heel, unstageable: Secondary | ICD-10-CM | POA: Diagnosis not present

## 2016-01-07 DIAGNOSIS — T8189XD Other complications of procedures, not elsewhere classified, subsequent encounter: Secondary | ICD-10-CM | POA: Diagnosis not present

## 2016-01-07 NOTE — Addendum Note (Signed)
Addended by: Kaleen Mask on: 01/07/2016 10:28 AM   Modules accepted: Orders

## 2016-01-08 DIAGNOSIS — N170 Acute kidney failure with tubular necrosis: Secondary | ICD-10-CM | POA: Diagnosis not present

## 2016-01-08 DIAGNOSIS — N2581 Secondary hyperparathyroidism of renal origin: Secondary | ICD-10-CM | POA: Diagnosis not present

## 2016-01-10 DIAGNOSIS — N2581 Secondary hyperparathyroidism of renal origin: Secondary | ICD-10-CM | POA: Diagnosis not present

## 2016-01-10 DIAGNOSIS — N170 Acute kidney failure with tubular necrosis: Secondary | ICD-10-CM | POA: Diagnosis not present

## 2016-01-13 DIAGNOSIS — N170 Acute kidney failure with tubular necrosis: Secondary | ICD-10-CM | POA: Diagnosis not present

## 2016-01-13 DIAGNOSIS — N2581 Secondary hyperparathyroidism of renal origin: Secondary | ICD-10-CM | POA: Diagnosis not present

## 2016-01-14 ENCOUNTER — Encounter: Payer: No Typology Code available for payment source | Attending: Physician Assistant | Admitting: Physician Assistant

## 2016-01-14 DIAGNOSIS — E11621 Type 2 diabetes mellitus with foot ulcer: Secondary | ICD-10-CM | POA: Insufficient documentation

## 2016-01-14 DIAGNOSIS — I482 Chronic atrial fibrillation: Secondary | ICD-10-CM | POA: Insufficient documentation

## 2016-01-14 DIAGNOSIS — I5033 Acute on chronic diastolic (congestive) heart failure: Secondary | ICD-10-CM | POA: Diagnosis not present

## 2016-01-14 DIAGNOSIS — Z89412 Acquired absence of left great toe: Secondary | ICD-10-CM | POA: Diagnosis not present

## 2016-01-14 DIAGNOSIS — S98112S Complete traumatic amputation of left great toe, sequela: Secondary | ICD-10-CM | POA: Insufficient documentation

## 2016-01-14 DIAGNOSIS — I872 Venous insufficiency (chronic) (peripheral): Secondary | ICD-10-CM | POA: Diagnosis not present

## 2016-01-14 DIAGNOSIS — L97511 Non-pressure chronic ulcer of other part of right foot limited to breakdown of skin: Secondary | ICD-10-CM | POA: Diagnosis not present

## 2016-01-14 DIAGNOSIS — J449 Chronic obstructive pulmonary disease, unspecified: Secondary | ICD-10-CM | POA: Insufficient documentation

## 2016-01-14 DIAGNOSIS — M1990 Unspecified osteoarthritis, unspecified site: Secondary | ICD-10-CM | POA: Insufficient documentation

## 2016-01-14 DIAGNOSIS — E1140 Type 2 diabetes mellitus with diabetic neuropathy, unspecified: Secondary | ICD-10-CM | POA: Insufficient documentation

## 2016-01-14 DIAGNOSIS — X58XXXS Exposure to other specified factors, sequela: Secondary | ICD-10-CM | POA: Insufficient documentation

## 2016-01-14 DIAGNOSIS — L97521 Non-pressure chronic ulcer of other part of left foot limited to breakdown of skin: Secondary | ICD-10-CM | POA: Diagnosis not present

## 2016-01-14 DIAGNOSIS — I132 Hypertensive heart and chronic kidney disease with heart failure and with stage 5 chronic kidney disease, or end stage renal disease: Secondary | ICD-10-CM | POA: Insufficient documentation

## 2016-01-14 DIAGNOSIS — L89153 Pressure ulcer of sacral region, stage 3: Secondary | ICD-10-CM | POA: Diagnosis not present

## 2016-01-14 DIAGNOSIS — M109 Gout, unspecified: Secondary | ICD-10-CM | POA: Insufficient documentation

## 2016-01-14 DIAGNOSIS — N186 End stage renal disease: Secondary | ICD-10-CM | POA: Insufficient documentation

## 2016-01-14 DIAGNOSIS — I252 Old myocardial infarction: Secondary | ICD-10-CM | POA: Insufficient documentation

## 2016-01-14 DIAGNOSIS — L97522 Non-pressure chronic ulcer of other part of left foot with fat layer exposed: Secondary | ICD-10-CM | POA: Insufficient documentation

## 2016-01-14 DIAGNOSIS — L97811 Non-pressure chronic ulcer of other part of right lower leg limited to breakdown of skin: Secondary | ICD-10-CM | POA: Diagnosis not present

## 2016-01-14 DIAGNOSIS — Z992 Dependence on renal dialysis: Secondary | ICD-10-CM | POA: Insufficient documentation

## 2016-01-14 DIAGNOSIS — E785 Hyperlipidemia, unspecified: Secondary | ICD-10-CM | POA: Insufficient documentation

## 2016-01-14 DIAGNOSIS — L97512 Non-pressure chronic ulcer of other part of right foot with fat layer exposed: Secondary | ICD-10-CM | POA: Insufficient documentation

## 2016-01-14 NOTE — Progress Notes (Signed)
Mark [name], Mark [name] ([id number]) Visit Report for 01/14/2016 Abuse/Suicide Risk Screen Details Patient Name: Mark [name], Mark [name]. Date of Service: 01/14/2016 8:45 AM Medical Record Number: [id number] Patient Account Number: [id number] Date of Birth/Sex: Aug 22, 1940 (75 y.o. Male) Treating RN: Mark [name] Gouty, RN, BSN, Velva Harman Primary Care Physician: COX, KIRSTEN Other Clinician: Referring Physician: Harold Barban Treating Physician/Extender: STONE III, HOYT Weeks in Treatment: 0 Abuse/Suicide Risk Screen Items Answer ABUSE/SUICIDE RISK SCREEN: Has anyone close to you tried to hurt or harm you recentlyo No Do you feel uncomfortable with anyone in your familyo No Has anyone forced you do things that you didnot want to doo No Do you have any thoughts of harming yourselfo No Patient displays signs or symptoms of abuse and/or neglect. No Electronic Signature(s) Signed: 01/14/2016 4:05:26 PM By: Regan Lemming BSN, RN Entered By: Regan Lemming on 01/14/2016 08:50:55 Mark [name], Mark [name] ([id number]) -------------------------------------------------------------------------------- Activities of Daily Living Details Patient Name: Mark [name], Mark H. Date of Service: 01/14/2016 8:45 AM Medical Record Number: [id number] Patient Account Number: [id number] Date of Birth/Sex: 28-May-1940 (75 y.o. Male) Treating RN: Mark [name] Gouty, RN, BSN, Velva Harman Primary Care Physician: COX, KIRSTEN Other Clinician: Referring Physician: Harold Barban Treating Physician/Extender: Melburn Hake, HOYT Weeks in Treatment: 0 Activities of Daily Living Items Answer Activities of Daily Living (Please select one for each item) Drive Automobile Not Able Take Medications Need Assistance Use Telephone Need Assistance Care for Appearance Need Assistance Use Toilet Need Counsellor / Shower Need Assistance Dress Self Need Assistance Feed Self Completely Able Walk Need Assistance Get In / Out Bed Need Assistance Housework Need Assistance Prepare  Meals Need Assistance Handle Money Need Assistance Shop for Self Need Assistance Electronic Signature(s) Signed: 01/14/2016 4:05:26 PM By: Regan Lemming BSN, RN Entered By: Regan Lemming on 01/14/2016 08:50:45 Mark [name] ([id number]) -------------------------------------------------------------------------------- Education Assessment Details Patient Name: Mark [name]. Date of Service: 01/14/2016 8:45 AM Medical Record Number: [id number] Patient Account Number: [id number] Date of Birth/Sex: 10/26/1940 (75 y.o. Male) Treating RN: Mark [name] Gouty, RN, BSN, Velva Harman Primary Care Physician: COX, KIRSTEN Other Clinician: Referring Physician: Harold Barban Treating Physician/Extender: Melburn Hake, HOYT Weeks in Treatment: 0 Primary Learner Assessed: Patient Learning Preferences/Education Level/Primary Language Learning Preference: Explanation Highest Education Level: High School Preferred Language: English Cognitive Barrier Assessment/Beliefs Language Barrier: No Physical Barrier Assessment Impaired Vision: Yes Glasses Impaired Hearing: No Decreased Hand dexterity: No Knowledge/Comprehension Assessment Knowledge Level: Medium Comprehension Level: Medium Ability to understand written Medium instructions: Ability to understand verbal Medium instructions: Motivation Assessment Anxiety Level: Calm Cooperation: Cooperative Education Importance: Acknowledges Need Interest in Health Problems: Asks Questions Perception: Coherent Willingness to Engage in Self- Medium Management Activities: Readiness to Engage in Self- Medium Management Activities: Electronic Signature(s) Signed: 01/14/2016 4:05:26 PM By: Regan Lemming BSN, RN Entered By: Regan Lemming on 01/14/2016 08:49:55 Mark [name], Mark [name] ([id number]) -------------------------------------------------------------------------------- Fall Risk Assessment Details Patient Name: Mark [name]. Date of Service: 01/14/2016 8:45  AM Medical Record Number: [id number] Patient Account Number: [id number] Date of Birth/Sex: 05/14/1940 (75 y.o. Male) Treating RN: Mark [name] Gouty, RN, BSN, [place] Primary Care Physician: COX, KIRSTEN Other Clinician: Referring Physician: Harold Barban Treating Physician/Extender: Melburn Hake, HOYT Weeks in Treatment: 0 Fall Risk Assessment Items Have you had 2 or more falls in the last 12 monthso 0 Yes Have you had any fall that resulted in injury in the last 12 monthso 0 No FALL RISK ASSESSMENT: History of falling - immediate or within 3 months 25 Yes Secondary diagnosis 0 No Ambulatory aid None/bed rest/wheelchair/nurse 0 Yes Crutches/cane/walker 0 No Furniture 0 No  IV Access/Saline Lock 0 No Gait/Training Normal/bed rest/immobile 0 No Weak 10 Yes Impaired 0 No Mental Status Oriented to own ability 0 Yes Electronic Signature(s) Signed: 01/14/2016 4:05:26 PM By: Regan Lemming BSN, RN Entered By: Regan Lemming on 01/14/2016 08:49:34 Mark [name] ([id number]) -------------------------------------------------------------------------------- Foot Assessment Details Patient Name: Mark Devoid H. Date of Service: 01/14/2016 8:45 AM Medical Record Number: [id number] Patient Account Number: [id number] Date of Birth/Sex: [birth year] (75 y.o. Male) Treating RN: Mark [name] Gouty, RN, BSN, Velva Harman Primary Care Physician: COX, KIRSTEN Other Clinician: Referring Physician: Harold Barban Treating Physician/Extender: STONE III, HOYT Weeks in Treatment: 0 Foot Assessment Items Site Locations + = Sensation present, - = Sensation absent, C = Callus, U = Ulcer R = Redness, W = Warmth, M = Maceration, PU = Pre-ulcerative lesion F = Fissure, S = Swelling, D = Dryness Assessment Right: Left: Other Deformity: No No Prior Foot Ulcer: No No Prior Amputation: No No Charcot Joint: No No Ambulatory Status: Ambulatory With Help Assistance Device: Wheelchair Gait: Administrator, arts) Signed: 01/14/2016  4:05:26 PM By: Regan Lemming BSN, RN Entered By: Regan Lemming on 01/14/2016 08:49:03 Mark [name] ([id number]) -------------------------------------------------------------------------------- Nutrition Risk Assessment Details Patient Name: Mark Devoid H. Date of Service: 01/14/2016 8:45 AM Medical Record Number: [id number] Patient Account Number: [id number] Date of Birth/Sex: March 28, 1941 (75 y.o. Male) Treating RN: Mark [name] Gouty, RN, BSN, Velva Harman Primary Care Physician: COX, KIRSTEN Other Clinician: Referring Physician: Harold Barban Treating Physician/Extender: STONE III, HOYT Weeks in Treatment: 0 Height (in): Weight (lbs): Body Mass Index (BMI): Nutrition Risk Assessment Items NUTRITION RISK SCREEN: I have an illness or condition that made me change the kind and/or 0 No amount of food I eat I eat fewer than two meals per day 0 No I eat few fruits and vegetables, or milk products 0 No I have three or more drinks of beer, liquor or wine almost every day 0 No I have tooth or mouth problems that make it hard for me to eat 0 No I don't always have enough money to buy the food I need 0 No I eat alone most of the time 0 No I take three or more different prescribed or over-the-counter drugs a 0 No day Without wanting to, I have lost or gained 10 pounds in the last six 0 No months I am not always physically able to shop, cook and/or feed myself 0 No Nutrition Protocols Good Risk Protocol 0 No interventions needed Moderate Risk Protocol Electronic Signature(s) Signed: 01/14/2016 4:05:26 PM By: Regan Lemming BSN, RN Entered By: Regan Lemming on 01/14/2016 08:49:14

## 2016-01-14 NOTE — Progress Notes (Addendum)
ERICKA, ANTOSH ([id number]) Visit Report for 01/14/2016 Allergy List Details Patient Name: Mark [name], Mark [name]. Date of Service: 01/14/2016 8:45 AM Medical Record Number: [id number] Patient Account Number: [id number] Date of Birth/Sex: 1940-08-04 (75 y.o. Male) Treating RN: Baruch Gouty, RN, BSN, Velva Harman Primary Care Physician: Rochel Brome Other Clinician: Referring Physician: Harold Barban Treating Physician/Extender: STONE III, HOYT Weeks in Treatment: 0 Allergies Active Allergies no known allergies Allergy Notes Electronic Signature(s) Signed: 01/14/2016 4:05:26 PM By: Regan Lemming BSN, RN Entered By: Regan Lemming on 01/14/2016 08:48:47 Mark [name] ([id number]) -------------------------------------------------------------------------------- Arrival Information Details Patient Name: Mark [name]. Date of Service: 01/14/2016 8:45 AM Medical Record Number: [id number] Patient Account Number: [id number] Date of Birth/Sex: [birth year] (75 y.o. Male) Treating RN: Baruch Gouty, RN, BSN, Velva Harman Primary Care Physician: COX, KIRSTEN Other Clinician: Referring Physician: Harold Barban Treating Physician/Extender: Melburn Hake, HOYT Weeks in Treatment: 0 Visit Information Patient Arrived: Wheel Chair Arrival Time: 08:41 Accompanied By: self Transfer Assistance: None Patient Identification Verified: Yes Secondary Verification Process Yes Completed: Patient Requires Transmission-Based No Precautions: Patient Has Alerts: No Electronic Signature(s) Signed: 01/14/2016 4:05:26 PM By: Regan Lemming BSN, RN Entered By: Regan Lemming on 01/14/2016 08:47:17 Mark [name] ([id number]) -------------------------------------------------------------------------------- Clinic Level of Care Assessment Details Patient Name: Mark [name]. Date of Service: 01/14/2016 8:45 AM Medical Record Number: [id number] Patient Account Number: [id number] Date of Birth/Sex: 29-Apr-1940 (75 y.o. Male) Treating  RN: Baruch Gouty, RN, BSN, Velva Harman Primary Care Physician: COX, KIRSTEN Other Clinician: Referring Physician: Harold Barban Treating Physician/Extender: Melburn Hake, HOYT Weeks in Treatment: 0 Clinic Level of Care Assessment Items TOOL 2 Quantity Score []  - Use when only an EandM is performed on the INITIAL visit 0 ASSESSMENTS - Nursing Assessment / Reassessment X - General Physical Exam (combine w/ comprehensive assessment (listed just 1 20 below) when performed on new pt. evals) X - Comprehensive Assessment (HX, ROS, Risk Assessments, Wounds Hx, etc.) 1 25 ASSESSMENTS - Wound and Skin Assessment / Reassessment []  - Simple Wound Assessment / Reassessment - one wound 0 X - Complex Wound Assessment / Reassessment - multiple wounds 4 5 []  - Dermatologic / Skin Assessment (not related to wound area) 0 ASSESSMENTS - Ostomy and/or Continence Assessment and Care []  - Incontinence Assessment and Management 0 []  - Ostomy Care Assessment and Management (repouching, etc.) 0 PROCESS - Coordination of Care X - Simple Patient / Family Education for ongoing care 1 15 []  - Complex (extensive) Patient / Family Education for ongoing care 0 X - Staff obtains Programmer, systems, Records, Test Results / Process Orders 1 10 X - Staff telephones HHA, Nursing Homes / Clarify orders / etc 1 10 []  - Routine Transfer to another Facility (non-emergent condition) 0 []  - Routine Hospital Admission (non-emergent condition) 0 X - New Admissions / Biomedical engineer / Ordering NPWT, Apligraf, etc. 1 15 []  - Emergency Hospital Admission (emergent condition) 0 []  - Simple Discharge Coordination 0 Mark [name], Mark H. ([id number]) []  - Complex (extensive) Discharge Coordination 0 PROCESS - Special Needs []  - Pediatric / Minor Patient Management 0 []  - Isolation Patient Management 0 []  - Hearing / Language / Visual special needs 0 []  - Assessment of Community assistance (transportation, D/C planning, etc.) 0 []  - Additional  assistance / Altered mentation 0 []  - Support Surface(s) Assessment (bed, cushion, seat, etc.) 0 INTERVENTIONS - Wound Cleansing / Measurement X - Wound Imaging (photographs - any number of wounds) 1 5 []  - Wound Tracing (instead of photographs) 0 []  - Simple Wound Measurement - one wound  0 X - Complex Wound Measurement - multiple wounds 4 5 []  - Simple Wound Cleansing - one wound 0 X - Complex Wound Cleansing - multiple wounds 4 5 INTERVENTIONS - Wound Dressings X - Small Wound Dressing one or multiple wounds 4 10 []  - Medium Wound Dressing one or multiple wounds 0 []  - Large Wound Dressing one or multiple wounds 0 []  - Application of Medications - injection 0 INTERVENTIONS - Miscellaneous []  - External ear exam 0 []  - Specimen Collection (cultures, biopsies, blood, body fluids, etc.) 0 []  - Specimen(s) / Culture(s) sent or taken to Lab for analysis 0 X - Patient Transfer (multiple staff / Civil Service fast streamer / Similar devices) 1 10 []  - Simple Staple / Suture removal (25 or less) 0 []  - Complex Staple / Suture removal (26 or more) 0 Mark [name], Mark H. ([id number]) []  - Hypo / Hyperglycemic Management (close monitor of Blood Glucose) 0 []  - Ankle / Brachial Index (ABI) - do not check if billed separately 0 Has the patient been seen at the hospital within the last three years: Yes Total Score: 210 Level Of Care: New/Established - Level 5 Electronic Signature(s) Signed: 01/14/2016 4:05:26 PM By: Regan Lemming BSN, RN Entered By: Regan Lemming on 01/14/2016 09:49:53 Mark [name] ([id number]) -------------------------------------------------------------------------------- Encounter Discharge Information Details Patient Name: Mark [name]. Date of Service: 01/14/2016 8:45 AM Medical Record Number: [id number] Patient Account Number: [id number] Date of Birth/Sex: 26-May-1940 (75 y.o. Male) Treating RN: Baruch Gouty, RN, BSN, Velva Harman Primary Care Physician: COX, KIRSTEN Other Clinician: Referring  Physician: Harold Barban Treating Physician/Extender: Melburn Hake, HOYT Weeks in Treatment: 0 Encounter Discharge Information Items Discharge Pain Level: 0 Discharge Condition: Stable Ambulatory Status: Wheelchair Discharge Destination: Nursing Home Transportation: Other Accompanied By: neice Schedule Follow-up Appointment: No Medication Reconciliation completed and provided to Patient/Care No Jeremia Groot: Provided on Clinical Summary of Care: 01/14/2016 Form Type Recipient Paper Patient [facility] Electronic Signature(s) Signed: 01/14/2016 2:52:09 PM By: Regan Lemming BSN, RN Previous Signature: 01/14/2016 10:13:32 AM Version By: Ruthine Dose Entered By: Regan Lemming on 01/14/2016 14:52:09 Mark [name] ([id number]) -------------------------------------------------------------------------------- Lower Extremity Assessment Details Patient Name: Mark Devoid H. Date of Service: 01/14/2016 8:45 AM Medical Record Number: [id number] Patient Account Number: [id number] Date of Birth/Sex: 1940-07-09 (75 y.o. Male) Treating RN: Baruch Gouty, RN, BSN, Velva Harman Primary Care Physician: COX, KIRSTEN Other Clinician: Referring Physician: Harold Barban Treating Physician/Extender: Melburn Hake, HOYT Weeks in Treatment: 0 Vascular Assessment Pulses: Posterior Tibial Dorsalis Pedis Palpable: [Left:Yes] [Right:No] Doppler: [Left:Monophasic] [Right:Monophasic] Extremity colors, hair growth, and conditions: Extremity Color: [Left:Mottled] [Right:Mottled] Hair Growth on Extremity: [Left:No] [Right:No] Temperature of Extremity: [Left:Warm] [Right:Warm] Capillary Refill: [Left:< 3 seconds] [Right:< 3 seconds] Toe Nail Assessment Left: Right: Thick: Yes Yes Discolored: Yes Yes Deformed: No No Improper Length and Hygiene: No No Electronic Signature(s) Signed: 01/14/2016 4:05:26 PM By: Regan Lemming BSN, RN Entered By: Regan Lemming on 01/14/2016 09:04:48 Mark [name]  ([id number]) -------------------------------------------------------------------------------- Multi Wound Chart Details Patient Name: Mark [name]. Date of Service: 01/14/2016 8:45 AM Medical Record Number: [id number] Patient Account Number: [id number] Date of Birth/Sex: 04-Mar-1941 (75 y.o. Male) Treating RN: Baruch Gouty, RN, BSN, Velva Harman Primary Care Physician: COX, KIRSTEN Other Clinician: Referring Physician: Harold Barban Treating Physician/Extender: STONE III, HOYT Weeks in Treatment: 0 Vital Signs Height(in): 67 Pulse(bpm): 112 Weight(lbs): 190 Blood Pressure 131/74 (mmHg): Body Mass Index(BMI): 30 Temperature(F): 97.8 Respiratory Rate 18 (breaths/min): Photos: [1:No Photos] [2:No Photos] [3:No Photos] Wound Location: [1:Left Toe Great] [2:Right Toe Great - Dorsal Right Lower Leg - Anterior]  Wounding Event: [1:Surgical Injury] [2:Gradually Appeared] [3:Gradually Appeared] 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] [2:the Lower Extremity] Comorbid History: [1:Chronic Obstructive Pulmonary Disease (COPD), Arrhythmia, Hypertension, Myocardial Hypertension, Myocardial Hypertension, Myocardial Infarction, Type II Diabetes, End Stage Renal Disease, Gout, Osteoarthritis, Neuropathy  Osteoarthritis, Neuropathy Osteoarthritis, Neuropathy] [2:Chronic Obstructive Pulmonary Disease (COPD), Arrhythmia, Infarction, Type II Diabetes, End Stage Renal Disease, Gout,] [3:Chronic Obstructive Pulmonary Disease (COPD), Arrhythmia, Infarction,  Type II Diabetes, End Stage Renal Disease, Gout,] Date Acquired: [1:10/16/2015] [2:12/10/2015] [3:12/10/2015] Weeks of Treatment: [1:0] [2:0] [3:0] Wound Status: [1:Open] [2:Open] [3:Open] Measurements L x W x D 2.4x2.3x0.3 [2:0.7x0.8x0.1] [3:5x6x0.1] (cm) Area (cm) : [1:4.335] [2:0.44] [3:23.562] Volume (cm) : [1:1.301] [2:0.044] [3:2.356] Classification: [1:Grade 1] [2:Grade 1] [3:Partial  Thickness] HBO Classification: [1:N/A] [2:N/A] [3:Grade 0] Exudate Amount: [1:Large] [2:Medium] [3:Small] Exudate Type: [1:Serosanguineous] [2:Serosanguineous] [3:Serosanguineous] Exudate Color: [1:red, brown] [2:red, brown] [3:red, brown] Wound Margin: [1:Distinct, outline attached Distinct, outline attached Distinct, outline attached] Granulation Amount: [1:Small (1-33%)] [2:None Present (0%)] [3:None Present (0%)] Necrotic Amount: [1:Large (67-100%)] [2:Large (67-100%)] [3:Large (67-100%)] Necrotic Tissue: [1:Adherent Slough] [2:Adherent Slough] [3:Eschar] Exposed Structures: RABURN, WIGAL. ([id number]) Fascia: No Fascia: No Fascia: No Fat: No Fat: No Fat: No Tendon: No Tendon: No Tendon: No Muscle: No Muscle: No Muscle: No Joint: No Joint: No Joint: No Bone: No Bone: No Bone: No Limited to Skin Limited to Skin Limited to Skin Breakdown Breakdown Breakdown Epithelialization: None None None Periwound Skin Texture: Edema: No Edema: No Edema: No Excoriation: No Excoriation: No Excoriation: No Induration: No Induration: No Induration: No Callus: No Callus: No Callus: No Crepitus: No Crepitus: No Crepitus: No Fluctuance: No Fluctuance: No Fluctuance: No Friable: No Friable: No Friable: No Rash: No Rash: No Rash: No Scarring: No Scarring: No Scarring: No Periwound Skin Moist: Yes Maceration: Yes Moist: Yes Moisture: Maceration: No Moist: Yes Maceration: No Dry/Scaly: No Dry/Scaly: No Dry/Scaly: No Periwound Skin Color: Atrophie Blanche: No Atrophie Blanche: No Hemosiderin Staining: Yes Cyanosis: No Cyanosis: No Mottled: Yes Ecchymosis: No Ecchymosis: No Atrophie Blanche: No Erythema: No Erythema: No Cyanosis: No Hemosiderin Staining: No Hemosiderin Staining: No Ecchymosis: No Mottled: No Mottled: No Erythema: No Pallor: No Pallor: No Pallor: No Rubor: No Rubor: No Rubor: No Temperature: No Abnormality No Abnormality No  Abnormality Tenderness on No No No Palpation: Wound Preparation: Ulcer Cleansing: Ulcer Cleansing: Ulcer Cleansing: Rinsed/Irrigated with Rinsed/Irrigated with Rinsed/Irrigated with Saline Saline Saline Topical Anesthetic Topical Anesthetic Topical Anesthetic Applied: Other: lidocaine Applied: Other: lidocaine Applied: Other: lidocaine 4 4% 4% % Wound Number: 4 N/A N/A Photos: No Photos N/A N/A Wound Location: Sacrum N/A N/A Wounding Event: Pressure Injury N/A N/A Primary Etiology: Pressure Ulcer N/A N/A Comorbid History: Chronic Obstructive N/A N/A Pulmonary Disease (COPD), Arrhythmia, Hypertension, Myocardial Infarction, Type II Mark [name], Mark H. ([id number]) Diabetes, End Stage Renal Disease, Gout, Osteoarthritis, Neuropathy Date Acquired: 11/12/2015 N/A N/A Weeks of Treatment: 0 N/A N/A Wound Status: Open N/A N/A Measurements L x W x D 2.5x5x0.1 N/A N/A (cm) Area (cm) : 9.817 N/A N/A Volume (cm) : 0.982 N/A N/A Classification: Category/Stage II N/A N/A HBO Classification: N/A N/A N/A Exudate Amount: Medium N/A N/A Exudate Type: Serosanguineous N/A N/A Exudate Color: red, brown N/A N/A Wound Margin: Distinct, outline attached N/A N/A Granulation Amount: None Present (0%) N/A N/A Necrotic Amount: Large (67-100%) N/A N/A Necrotic Tissue: Adherent Slough N/A N/A Exposed Structures: Fascia: No N/A N/A Fat: No Tendon: No Muscle: No Joint: No Bone: No Limited to Skin Breakdown Epithelialization:  None N/A N/A Periwound Skin Texture: Edema: No N/A N/A Excoriation: No Induration: No Callus: No Crepitus: No Fluctuance: No Friable: No Rash: No Scarring: No Periwound Skin Moist: Yes N/A N/A Moisture: Maceration: No Dry/Scaly: No Periwound Skin Color: Atrophie Blanche: No N/A N/A Cyanosis: No Ecchymosis: No Erythema: No Hemosiderin Staining: No Mottled: No Pallor: No Rubor: No Temperature: N/A N/A N/A No N/A N/A Mark [name], Mark H.  ([id number]) Tenderness on Palpation: Wound Preparation: Ulcer Cleansing: N/A N/A Rinsed/Irrigated with Saline Topical Anesthetic Applied: Other: Lidocaine 4% Treatment Notes Electronic Signature(s) Signed: 01/14/2016 4:05:26 PM By: Regan Lemming BSN, RN Entered By: Regan Lemming on 01/14/2016 09:38:39 Mark [name], Mark [name] ([id number]) -------------------------------------------------------------------------------- Multi-Disciplinary Care Plan Details Patient Name: Mark [name]. Date of Service: 01/14/2016 8:45 AM Medical Record Number: [id number] Patient Account Number: [id number] Date of Birth/Sex: 1940/12/21 (75 y.o. Male) Treating RN: Baruch Gouty, RN, BSN, Velva Harman Primary Care Physician: COX, KIRSTEN Other Clinician: Referring Physician: Harold Barban Treating Physician/Extender: Melburn Hake, HOYT Weeks in Treatment: 0 Active Inactive Orientation to the Wound Care Program Nursing Diagnoses: Knowledge deficit related to the wound healing center program Goals: Patient/caregiver will verbalize understanding of the [place] Program Date Initiated: 01/14/2016 Goal Status: Active Interventions: Provide education on orientation to the wound center Notes: Venous Leg Ulcer Nursing Diagnoses: Knowledge deficit related to disease process and management Potential for venous Insuffiency (use before diagnosis confirmed) Goals: Patient will maintain optimal edema control Date Initiated: 01/14/2016 Goal Status: Active Patient/caregiver will verbalize understanding of disease process and disease management Date Initiated: 01/14/2016 Goal Status: Active Verify adequate tissue perfusion prior to therapeutic compression application Date Initiated: 01/14/2016 Goal Status: Active Interventions: Assess peripheral edema status every visit. Provide education on venous insufficiency Notes: DIEM, STINTON ([id number]) Wound/Skin Impairment Nursing Diagnoses: Impaired tissue  integrity Knowledge deficit related to ulceration/compromised skin integrity Goals: Patient/caregiver will verbalize understanding of skin care regimen Date Initiated: 01/14/2016 Goal Status: Active Ulcer/skin breakdown will have a volume reduction of 30% by week 4 Date Initiated: 01/14/2016 Goal Status: Active Ulcer/skin breakdown will have a volume reduction of 50% by week 8 Date Initiated: 01/14/2016 Goal Status: Active Ulcer/skin breakdown will have a volume reduction of 80% by week 12 Date Initiated: 01/14/2016 Goal Status: Active Ulcer/skin breakdown will heal within 14 weeks Date Initiated: 01/14/2016 Goal Status: Active Interventions: Assess patient/caregiver ability to obtain necessary supplies Assess patient/caregiver ability to perform ulcer/skin care regimen upon admission and as needed Assess ulceration(s) every visit Provide education on ulcer and skin care Treatment Activities: Skin care regimen initiated : 01/14/2016 Topical wound management initiated : 01/14/2016 Notes: Electronic Signature(s) Signed: 01/14/2016 4:05:26 PM By: Regan Lemming BSN, RN Entered By: Regan Lemming on 01/14/2016 09:38:23 Mark [name] ([id number]) -------------------------------------------------------------------------------- Pain Assessment Details Patient Name: Mark [name]. Date of Service: 01/14/2016 8:45 AM Medical Record Number: [id number] Patient Account Number: [id number] Date of Birth/Sex: July 27, 1940 (75 y.o. Male) Treating RN: Baruch Gouty, RN, BSN, Velva Harman Primary Care Physician: COX, KIRSTEN Other Clinician: Referring Physician: Harold Barban Treating Physician/Extender: STONE III, HOYT Weeks in Treatment: 0 Active Problems Location of Pain Severity and Description of Pain Patient Has Paino Yes Site Locations Pain Location: Pain in Ulcers With Dressing Change: Yes Rate the pain. Current Pain Level: 4 Worst Pain Level: 5 Character of Pain Describe the Pain:  Aching, Tender Pain Management and Medication Current Pain Management: Medication: Yes Cold Application: Yes Rest: Yes Massage: Yes Activity: Yes T.E.N.S.: Yes Heat Application: Yes Leg drop or elevation: Yes Is the Current Pain Management Adequate  Adequate: How does your pain impact your activities of daily livingo Sleep: Yes Bathing: Yes Appetite: Yes Relationship With Others: Yes Bladder Continence: Yes Emotions: Yes Bowel Continence: Yes Work: Yes Toileting: Yes Drive: Yes Dressing: Yes Hobbies: Yes Electronic Signature(s) Signed: 01/14/2016 4:05:26 PM By: Regan Lemming BSN, RN Mark [name], Mark [name] ([id number]) Entered By: Regan Lemming on 01/14/2016 08:47:44 Mark [name] ([id number]) -------------------------------------------------------------------------------- Patient/Caregiver Education Details Patient Name: Mark [name]. Date of Service: 01/14/2016 8:45 AM Medical Record Number: [id number] Patient Account Number: [id number] Date of Birth/Gender: 1940/10/23 (74 y.o. Male) Treating RN: Baruch Gouty, RN, BSN, Velva Harman Primary Care Physician: COX, KIRSTEN Other Clinician: Referring Physician: Harold Barban Treating Physician/Extender: Sharalyn Ink in Treatment: 0 Education Assessment Education Provided To: Patient Education Topics Provided Venous: Methods: Explain/Verbal Responses: State content correctly Welcome To [facility] [place]: Methods: Explain/Verbal Responses: State content correctly Wound/Skin Impairment: Methods: Explain/Verbal Responses: State content correctly Electronic Signature(s) Signed: 01/14/2016 4:05:26 PM By: Regan Lemming BSN, RN Entered By: Regan Lemming on 01/14/2016 14:52:37 Mark [name] ([id number]) -------------------------------------------------------------------------------- Wound Assessment Details Patient Name: Mark Devoid H. Date of Service: 01/14/2016 8:45 AM Medical Record Number: [id number] Patient  Account Number: [id number] Date of Birth/Sex: December 30, 1940 (75 y.o. Male) Treating RN: Baruch Gouty, RN, BSN, [place] Primary Care Physician: COX, KIRSTEN Other Clinician: Referring Physician: Harold Barban Treating Physician/Extender: STONE III, HOYT Weeks in Treatment: 0 Wound Status Wound Number: 1 Primary Diabetic Wound/Ulcer of the Lower Etiology: Extremity Wound Location: Left Toe Great Wound Open Wounding Event: Surgical Injury Status: Date Acquired: 10/16/2015 Comorbid Chronic Obstructive Pulmonary Disease Weeks Of Treatment: 0 History: (COPD), Arrhythmia, Hypertension, Clustered Wound: No Myocardial Infarction, Type II Diabetes, End Stage Renal Disease, Gout, Osteoarthritis, Neuropathy Photos Photo Uploaded By: Regan Lemming on 01/14/2016 16:04:29 Wound Measurements Length: (cm) 2.4 Width: (cm) 2.3 Depth: (cm) 0.3 Area: (cm) 4.335 Volume: (cm) 1.301 % Reduction in Area: % Reduction in Volume: Epithelialization: None Tunneling: No Undermining: No Wound Description Classification: Grade 1 Foul Odor Aft Wound Margin: Distinct, outline attached Exudate Amount: Large Exudate Type: Serosanguineous Exudate Color: red, brown er Cleansing: No Wound Bed Granulation Amount: Small (1-33%) Exposed Structure Necrotic Amount: Large (67-100%) Fascia Exposed: No Necrotic Quality: Adherent Slough Fat Layer Exposed: No Tendon Exposed: No Mcnairy, Jayke H. ([id number]) Muscle Exposed: No Joint Exposed: No Bone Exposed: No Limited to Skin Breakdown Periwound Skin Texture Texture Color No Abnormalities Noted: No No Abnormalities Noted: No Callus: No Atrophie Blanche: No Crepitus: No Cyanosis: No Excoriation: No Ecchymosis: No Fluctuance: No Erythema: No Friable: No Hemosiderin Staining: No Induration: No Mottled: No Localized Edema: No Pallor: No Rash: No Rubor: No Scarring: No Temperature / Pain Moisture Temperature: No Abnormality No Abnormalities Noted: No Dry  / Scaly: No Maceration: No Moist: Yes Wound Preparation Ulcer Cleansing: Rinsed/Irrigated with Saline Topical Anesthetic Applied: Other: lidocaine 4%, Treatment Notes Wound #1 (Left Toe Great) 1. Cleansed with: Clean wound with Normal Saline 4. Dressing Applied: Santyl Ointment 5. Secondary Dressing Applied Gauze and Kerlix/Conform 7. Secured with Recruitment consultant) Signed: 01/14/2016 4:05:26 PM By: Regan Lemming BSN, RN Entered By: Regan Lemming on 01/14/2016 09:07:29 NEHEMYAH, WISE ([id number]) -------------------------------------------------------------------------------- Wound Assessment Details Patient Name: Mark Devoid H. Date of Service: 01/14/2016 8:45 AM Medical Record Number: [id number] Patient Account Number: [id number] Date of Birth/Sex: 02/10/1941 (75 y.o. Male) Treating RN: Baruch Gouty, RN, BSN, Velva Harman Primary Care Physician: COX, KIRSTEN Other Clinician: Referring Physician: Harold Barban Treating Physician/Extender: STONE III, HOYT Weeks in Treatment: 0 Wound Status Wound Number: 2 Primary Diabetic  Wound/Ulcer of the Lower Etiology: Extremity Wound Location: Right Toe Great - Dorsal Wound Open Wounding Event: Gradually Appeared Status: Date Acquired: 12/10/2015 Comorbid Chronic Obstructive Pulmonary Disease Weeks Of Treatment: 0 History: (COPD), Arrhythmia, Hypertension, Clustered Wound: No Myocardial Infarction, Type II Diabetes, End Stage Renal Disease, Gout, Osteoarthritis, Neuropathy Photos Photo Uploaded By: Regan Lemming on 01/14/2016 16:04:29 Wound Measurements Length: (cm) 0.7 Width: (cm) 0.8 Depth: (cm) 0.1 Area: (cm) 0.44 Volume: (cm) 0.044 % Reduction in Area: % Reduction in Volume: Epithelialization: None Tunneling: No Undermining: No Wound Description Classification: Grade 1 Foul Odor Aft Wound Margin: Distinct, outline attached Exudate Amount: Medium Exudate Type: Serosanguineous Exudate Color: red, brown er  Cleansing: No Wound Bed Granulation Amount: None Present (0%) Exposed Structure Necrotic Amount: Large (67-100%) Fascia Exposed: No Necrotic Quality: Adherent Slough Fat Layer Exposed: No Tendon Exposed: No Mark [name], Mark H. ([id number]) Muscle Exposed: No Joint Exposed: No Bone Exposed: No Limited to Skin Breakdown Periwound Skin Texture Texture Color No Abnormalities Noted: No No Abnormalities Noted: No Callus: No Atrophie Blanche: No Crepitus: No Cyanosis: No Excoriation: No Ecchymosis: No Fluctuance: No Erythema: No Friable: No Hemosiderin Staining: No Induration: No Mottled: No Localized Edema: No Pallor: No Rash: No Rubor: No Scarring: No Temperature / Pain Moisture Temperature: No Abnormality No Abnormalities Noted: No Dry / Scaly: No Maceration: Yes Moist: Yes Wound Preparation Ulcer Cleansing: Rinsed/Irrigated with Saline Topical Anesthetic Applied: Other: lidocaine 4%, Treatment Notes Wound #2 (Right, Dorsal Toe Great) 1. Cleansed with: Clean wound with Normal Saline 4. Dressing Applied: Santyl Ointment 5. Secondary Dressing Applied Gauze and Kerlix/Conform 7. Secured with Recruitment consultant) Signed: 01/14/2016 4:05:26 PM By: Regan Lemming BSN, RN Entered By: Regan Lemming on 01/14/2016 09:09:03 Mark [name], Mark [name] ([id number]) -------------------------------------------------------------------------------- Wound Assessment Details Patient Name: Mark Devoid H. Date of Service: 01/14/2016 8:45 AM Medical Record Number: [id number] Patient Account Number: [id number] Date of Birth/Sex: 1941-03-30 (75 y.o. Male) Treating RN: Baruch Gouty, RN, BSN, [place] Primary Care Physician: COX, KIRSTEN Other Clinician: Referring Physician: Harold Barban Treating Physician/Extender: STONE III, HOYT Weeks in Treatment: 0 Wound Status Wound Number: 3 Primary Venous Leg Ulcer Etiology: Wound Location: Right Lower Leg - Anterior Wound Open Wounding Event:  Gradually Appeared Status: Date Acquired: 12/10/2015 Comorbid Chronic Obstructive Pulmonary Disease Weeks Of Treatment: 0 History: (COPD), Arrhythmia, Hypertension, Clustered Wound: No Myocardial Infarction, Type II Diabetes, End Stage Renal Disease, Gout, Osteoarthritis, Neuropathy Photos Photo Uploaded By: Regan Lemming on 01/14/2016 16:04:29 Wound Measurements Length: (cm) 5 Width: (cm) 6 Depth: (cm) 0.1 Area: (cm) 23.562 Volume: (cm) 2.356 % Reduction in Area: % Reduction in Volume: Epithelialization: None Tunneling: No Undermining: No Wound Description Classification: Partial Thickness Diabetic Severity Earleen [place]): Grade 0 Wound Margin: Distinct, outline attach Exudate Amount: Small Exudate Type: Serosanguineous Exudate Color: red, brown Foul Odor After Cleansing: No ed Wound Bed Granulation Amount: None Present (0%) Exposed Structure Necrotic Amount: Large (67-100%) Fascia Exposed: No Necrotic Quality: Eschar Fat Layer Exposed: No Mark [name], Mark H. ([id number]) Tendon Exposed: No Muscle Exposed: No Joint Exposed: No Bone Exposed: No Limited to Skin Breakdown Periwound Skin Texture Texture Color No Abnormalities Noted: No No Abnormalities Noted: No Callus: No Atrophie Blanche: No Crepitus: No Cyanosis: No Excoriation: No Ecchymosis: No Fluctuance: No Erythema: No Friable: No Hemosiderin Staining: Yes Induration: No Mottled: Yes Localized Edema: No Pallor: No Rash: No Rubor: No Scarring: No Temperature / Pain Moisture Temperature: No Abnormality No Abnormalities Noted: No Dry / Scaly: No Maceration: No Moist: Yes Wound Preparation Ulcer Cleansing: Rinsed/Irrigated with Saline  Topical Anesthetic Applied: Other: lidocaine 4 %, Treatment Notes Wound #3 (Right, Anterior Lower Leg) 1. Cleansed with: Clean wound with Normal Saline 4. Dressing Applied: Aquacel Ag 5. Secondary Dressing Applied Bordered Foam Dressing Electronic  Signature(s) Signed: 01/14/2016 4:05:26 PM By: Regan Lemming BSN, RN Entered By: Regan Lemming on 01/14/2016 09:10:49 Mark [name], OHARROW ([id number]) -------------------------------------------------------------------------------- Wound Assessment Details Patient Name: Mark Devoid H. Date of Service: 01/14/2016 8:45 AM Medical Record Number: [id number] Patient Account Number: [id number] Date of Birth/Sex: Dec 12, 1940 (75 y.o. Male) Treating RN: Baruch Gouty, RN, BSN, [place] Primary Care Physician: COX, KIRSTEN Other Clinician: Referring Physician: Harold Barban Treating Physician/Extender: STONE III, HOYT Weeks in Treatment: 0 Wound Status Wound Number: 4 Primary Pressure Ulcer Etiology: Wound Location: Sacrum Wound Open Wounding Event: Pressure Injury Status: Date Acquired: 11/12/2015 Comorbid Chronic Obstructive Pulmonary Disease Weeks Of Treatment: 0 History: (COPD), Arrhythmia, Hypertension, Clustered Wound: No Myocardial Infarction, Type II Diabetes, End Stage Renal Disease, Gout, Osteoarthritis, Neuropathy Photos Photo Uploaded By: Regan Lemming on 01/14/2016 16:05:08 Wound Measurements Length: (cm) 2.5 Width: (cm) 5 Depth: (cm) 0.1 Area: (cm) 9.817 Volume: (cm) 0.982 % Reduction in Area: % Reduction in Volume: Epithelialization: None Tunneling: No Undermining: No Wound Description Classification: Category/Stage II Foul Odor Aft Wound Margin: Distinct, outline attached Exudate Amount: Medium Exudate Type: Serosanguineous Exudate Color: red, brown er Cleansing: No Wound Bed Granulation Amount: None Present (0%) Exposed Structure Necrotic Amount: Large (67-100%) Fascia Exposed: No Necrotic Quality: Adherent Slough Fat Layer Exposed: No Tendon Exposed: No Hakim, Pax H. ([id number]) Muscle Exposed: No Joint Exposed: No Bone Exposed: No Limited to Skin Breakdown Periwound Skin Texture Texture Color No Abnormalities Noted: No No Abnormalities Noted:  No Callus: No Atrophie Blanche: No Crepitus: No Cyanosis: No Excoriation: No Ecchymosis: No Fluctuance: No Erythema: No Friable: No Hemosiderin Staining: No Induration: No Mottled: No Localized Edema: No Pallor: No Rash: No Rubor: No Scarring: No Moisture No Abnormalities Noted: No Dry / Scaly: No Maceration: No Moist: Yes Wound Preparation Ulcer Cleansing: Rinsed/Irrigated with Saline Topical Anesthetic Applied: Other: Lidocaine 4%, Treatment Notes Wound #4 (Sacrum) 1. Cleansed with: Clean wound with Normal Saline 4. Dressing Applied: Aquacel Ag 5. Secondary Dressing Applied Bordered Foam Dressing Electronic Signature(s) Signed: 01/14/2016 4:05:26 PM By: Regan Lemming BSN, RN Entered By: Regan Lemming on 01/14/2016 09:15:00 KAYDEN, GUSTAFSON ([id number]) -------------------------------------------------------------------------------- Vitals Details Patient Name: Mark [name]. Date of Service: 01/14/2016 8:45 AM Medical Record Number: [id number] Patient Account Number: [id number] Date of Birth/Sex: 07-10-1940 (74 y.o. Male) Treating RN: Afful, RN, BSN, [place] Primary Care Physician: COX, KIRSTEN Other Clinician: Referring Physician: Harold Barban Treating Physician/Extender: STONE III, HOYT Weeks in Treatment: 0 Vital Signs Time Taken: 08:56 Temperature (F): 97.8 Height (in): 67 Pulse (bpm): 112 Source: Stated Respiratory Rate (breaths/min): 18 Weight (lbs): 190 Blood Pressure (mmHg): 131/74 Source: Stated Reference Range: 80 - 120 mg / dl Body Mass Index (BMI): 29.8 Electronic Signature(s) Signed: 01/14/2016 4:05:26 PM By: Regan Lemming BSN, RN Entered By: Regan Lemming on 01/14/2016 08:56:33

## 2016-01-15 DIAGNOSIS — N2581 Secondary hyperparathyroidism of renal origin: Secondary | ICD-10-CM | POA: Diagnosis not present

## 2016-01-15 DIAGNOSIS — N170 Acute kidney failure with tubular necrosis: Secondary | ICD-10-CM | POA: Diagnosis not present

## 2016-01-15 NOTE — Progress Notes (Signed)
[name], Mark ([id number]) Visit Report for 01/14/2016 Chief Complaint Document Details Patient Name: Mark [name], Mark [name]. Date of Service: 01/14/2016 8:45 AM Medical Record Number: [id number] Patient Account Number: [id number] Date of Birth/Sex: April 08, 1940 (75 y.o. Male) Treating RN: Baruch Gouty, RN, BSN, Velva Harman Primary Care Physician: Rochel Brome Other Clinician: Referring Physician: Harold Barban Treating Physician/Extender: Melburn Hake, Cung Masterson Weeks in Treatment: 0 Information Obtained from: Patient Chief Complaint Evaluation regarding patient's nonhealing left great toe amputation, right great toe ulcer, and sacral pressure ulcer Electronic Signature(s) Signed: 01/15/2016 9:39:47 AM By: Worthy Keeler PA-C Entered By: Worthy Keeler on 01/15/2016 08:13:31 AMERICUS, KORBY ([id number]) -------------------------------------------------------------------------------- HPI Details Patient Name: Mark [name]. Date of Service: 01/14/2016 8:45 AM Medical Record Number: [id number] Patient Account Number: [id number] Date of Birth/Sex: 02-26-1941 (75 y.o. Male) Treating RN: Baruch Gouty, RN, BSN, Velva Harman Primary Care Physician: COX, KIRSTEN Other Clinician: Referring Physician: Harold Barban Treating Physician/Extender: Melburn Hake, Samika Vetsch Weeks in Treatment: 0 History of Present Illness Location: Sacrum, dorsal right great toe, and left great toe amputation site. Quality: Patient describes the pain as a soreness and sometimes burning sensation Severity: 4 out of 5 Duration: developed in the last 4 months due to patient having to sit through dialysis secondary to his kidney failing Timing: The patient describes the pain as being fairly persistent mainly in the sacral region although this is worse is if he is laying on it. Context: As a result of pressure in regard to the sacral ulcer. The lower extremity wounds of the toe her diabetic in nature though the left great toe amputation site is  obviously a surgical wound. Modifying Factors: It appears that in the facility Santyl has been used on patient's wounds currently. Associated Signs and Symptoms: Patient has left great toe amputation, diabetes mellitus type 2, status post kidney transplant with subsequent kidney failure due to dye used for imaging studies, chronic atrial fibrillation, congestive heart failure HPI Description: 01/14/16 patient presents for initial evaluation concerning ulcers that he has over his sacral region which have developed more recently a result of him having to go back on dialysis secondary to his transplanted kidney failing which apparently was due to dye given for an imaging study. With that being said he tells me that he tries as much as possible to offload one way or another during the time that he is having to sit for dialysis. However nonetheless he has developed the pressure ulcer to the sacral region. Subsequently he also has a diabetic foot ulcer to the dorsal surface of his right great toe to which Santyl has been used topically at this point in time. He also has a still open amputation site regard to his left great toe with necrotic tissue present. this amputation was performed on November 06, 2015 Dr. Oneida Alar. Due to osteomyelitis of the left first toe.This still has not achieved a good granulation bed. Electronic Signature(s) Signed: 01/15/2016 9:39:47 AM By: Worthy Keeler PA-C Entered By: Worthy Keeler on 01/15/2016 09:32:24 MENSAH, GOSNELL ([id number]) -------------------------------------------------------------------------------- Physical Exam Details Patient Name: Mark [name]. Date of Service: 01/14/2016 8:45 AM Medical Record Number: [id number] Patient Account Number: [id number] Date of Birth/Sex: 01/12/1941 (75 y.o. Male) Treating RN: Baruch Gouty, RN, BSN, Velva Harman Primary Care Physician: COX, KIRSTEN Other Clinician: Referring Physician: Harold Barban Treating  Physician/Extender: STONE III, Kaiah Hosea Weeks in Treatment: 0 Constitutional supine blood pressure is within target range for patient.. pulse regular and within target range for patient.Marland Kitchen respirations regular, non-labored and within target range  for patient.Marland Kitchen temperature within target range for patient.. Well-nourished and well-hydrated in no acute distress. Eyes conjunctiva clear no eyelid edema noted. pupils equal round and reactive to light and accommodation. Ears, Nose, Mouth, and Throat no gross abnormality of ear auricles or external auditory canals. normal hearing noted during conversation. mucus membranes moist. Respiratory normal breathing without difficulty. clear to auscultation bilaterally. Cardiovascular regular rate and rhythm with normal S1, S2. Absent posterior tibial and dorsalis pedis pulses bilateral lower extremities. trace pitting edema. Gastrointestinal (GI) soft, non-tender, non-distended, +BS. No obvious ventral hernia noted at this point. Musculoskeletal Patient unable to walk without assistance. Patient has a noted absence of the left great toe secondary to amputation and a nonhealing ulcer at this location. Psychiatric this patient is able to make decisions and demonstrates good insight into disease process. Alert and Oriented x 3. pleasant and cooperative. Electronic Signature(s) Signed: 01/15/2016 9:39:47 AM By: Worthy Keeler PA-C Entered By: Worthy Keeler on 01/15/2016 09:32:42 DOMINYCK, CALIFF ([id number]) -------------------------------------------------------------------------------- Physician Orders Details Patient Name: Mark [name]. Date of Service: 01/14/2016 8:45 AM Medical Record Number: [id number] Patient Account Number: [id number] Date of Birth/Sex: [birth year] (75 y.o. Male) Treating RN: Baruch Gouty, RN, BSN, Velva Harman Primary Care Physician: COX, KIRSTEN Other Clinician: Referring Physician: Harold Barban Treating Physician/Extender: Melburn Hake, Jetaime Pinnix Weeks in Treatment: 0 Verbal / Phone Orders: Yes Clinician: Afful, RN, BSN, Rita Read Back and Verified: Yes Diagnosis Coding Wound Cleansing Wound #1 Left Toe Great o Clean wound with Normal Saline. o May shower with protection. Wound #2 Right,Dorsal Toe Great o Clean wound with Normal Saline. o May shower with protection. Wound #3 Right,Anterior Lower Leg o Clean wound with Normal Saline. o May shower with protection. Wound #4 Sacrum o Clean wound with Normal Saline. o May shower with protection. Anesthetic Wound #1 Left Toe Great o Topical Lidocaine 4% cream applied to wound bed prior to debridement Wound #2 Right,Dorsal Toe Great o Topical Lidocaine 4% cream applied to wound bed prior to debridement Wound #3 Right,Anterior Lower Leg o Topical Lidocaine 4% cream applied to wound bed prior to debridement Wound #4 Sacrum o Topical Lidocaine 4% cream applied to wound bed prior to debridement Skin Barriers/Peri-Wound Care Wound #1 Left Toe Great o Barrier cream Wound #2 Right,Dorsal Toe Great o Barrier cream WILMA, DALIA. ([id number]) Wound #3 Right,Anterior Lower Leg o Barrier cream Wound #4 Sacrum o Barrier cream Primary Wound Dressing Wound #1 Left Toe Great o Santyl Ointment Wound #2 Right,Dorsal Toe Great o Santyl Ointment Wound #3 Right,Anterior Lower Leg o Sorbalgon Ag Wound #4 Sacrum o Sorbalgon Ag Secondary Dressing Wound #3 Right,Anterior Lower Leg o Boardered Foam Dressing Wound #4 Sacrum o Boardered Foam Dressing Wound #1 Left Toe Great o Gauze and Kerlix/Conform Wound #2 Right,Dorsal Toe Great o Gauze and Kerlix/Conform Dressing Change Frequency Wound #3 Right,Anterior Lower Leg o Change dressing every other day. Wound #4 Sacrum o Change dressing every other day. Wound #1 Left Toe Great o Change dressing every day. Wound #2 Right,Dorsal Toe Great o Change dressing every  day. Follow-up Appointments Wound #1 Left [address], [place]. ([id number]) o Return Appointment in 1 week. Wound #2 Right,Dorsal Toe Great o Return Appointment in 1 week. Wound #3 Right,Anterior Lower Leg o Return Appointment in 1 week. Wound #4 Sacrum o Return Appointment in 1 week. Off-Loading o Turn and reposition every 2 hours o Mattress - Per Patient he is on an air mattress Additional Orders / Instructions Wound #1  Left Toe Great o Increase protein intake. o Activity as tolerated o Other: - ZInc, Vitamin A, C, MVI Wound #2 Right,Dorsal Toe Great o Increase protein intake. o Activity as tolerated o Other: - [employer]Inc, Vitamin A, C, MVI Wound #3 Right,Anterior Lower Leg o Increase protein intake. o Activity as tolerated o Other: - ZInc, Vitamin A, C, MVI Wound #4 Sacrum o Increase protein intake. o Activity as tolerated o Other: - [employer]Inc, Vitamin A, C, MVI Electronic Signature(s) Signed: 01/14/2016 4:05:26 PM By: Regan Lemming BSN, RN Signed: 01/15/2016 9:39:47 AM By: Worthy Keeler PA-C Entered By: Regan Lemming on 01/14/2016 09:48:42 NAZIER, GEBO ([id number]) -------------------------------------------------------------------------------- Problem List Details Patient Name: [name] H. Date of Service: 01/14/2016 8:45 AM Medical Record Number: [id number] Patient Account Number: [id number] Date of Birth/Sex: [birth year] (75 y.o. 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, sequela 01/15/2016 Yes Z89.412 Acquired absence of left great toe 01/15/2016 Yes L97.512 Non-pressure chronic ulcer of other part of right foot with 01/15/2016 Yes fat layer exposed L97.522 Non-pressure chronic ulcer of other part of left foot with fat 01/15/2016 Yes layer exposed Inactive Problems Resolved Problems Electronic Signature(s) Signed: 01/15/2016 9:39:47 AM By: Haze Rushing, Sebastian HMarland Kitchen ([id number]) Entered By: Worthy Keeler on 01/15/2016 09:38:07 Mark [name] ([id number]) -------------------------------------------------------------------------------- Progress Note Details Patient Name: Mark [name]. Date of Service: 01/14/2016 8:45 AM Medical Record Number: [id number] Patient Account Number: [id number] Date of Birth/Sex: [birth year] (75 y.o. Male) Treating RN: Baruch Gouty, RN, BSN, Velva Harman Primary Care Physician: COX, KIRSTEN Other Clinician: Referring Physician: Harold Barban Treating Physician/Extender: Melburn Hake, Torin Whisner Weeks in Treatment: 0 Subjective Chief Complaint Information obtained from Patient Evaluation regarding patient's nonhealing left great toe amputation, right great toe ulcer, and sacral pressure ulcer History of Present Illness (HPI) The following HPI elements were documented for the patient's wound: Location: Sacrum, dorsal right great toe, and left great toe amputation site. Quality: Patient describes the pain as a soreness and sometimes burning sensation Severity: 4 out of 5 Duration: developed in the last 4 months due to patient having to sit through dialysis secondary to his kidney failing Timing: The patient describes the pain as being fairly persistent mainly in the sacral region although this is worse is if he is laying on it. Context: As a result of pressure in regard to the sacral ulcer. The lower extremity wounds of the toe  her diabetic in nature though the left great toe amputation site is obviously a surgical wound. Modifying Factors: It appears that in the facility Santyl has been used on patient's wounds currently. Associated Signs and Symptoms: Patient has left great toe amputation, diabetes mellitus type 2, status post kidney transplant with subsequent kidney failure due to dye used for imaging studies, chronic atrial fibrillation, congestive heart failure 01/14/16 patient presents for initial evaluation concerning ulcers that he has over his sacral region which have developed more recently a result of him having to go back on dialysis secondary to his transplanted kidney failing which apparently was due to dye given for an imaging study. With  that being said he tells me that he tries as much as possible to offload one way or another during the time that he is having to sit for dialysis. However nonetheless he has developed the pressure ulcer to the sacral region. Subsequently he also has a diabetic foot ulcer to the dorsal surface of his right great toe to which Santyl has been used topically at this point in time. He also has a still open amputation site regard to his left great toe with necrotic tissue present. this amputation was performed on November 06, 2015 Dr. Oneida Alar. Due to osteomyelitis of the left first toe.This still has not achieved a good granulation bed. Wound History Patient presents with 3 open wounds that have been present for approximately unsure. Patient has been treating wounds in the following manner: dryh dressing. Laboratory tests have not been performed in the last month. Patient reportedly has not tested positive for an antibiotic resistant organism. Patient reportedly has not tested positive for osteomyelitis. Patient reportedly has not had testing performed to evaluate circulation in the legs. RYANLEE, BABAR ([id number]) Patient History Allergies no known allergies Social  History Never smoker, Marital Status - Single, Alcohol Use - Never, Drug Use - No History, Caffeine Use - Moderate. Medical History Hematologic/Lymphatic Denies history of Anemia, Hemophilia, Human Immunodeficiency Virus, Lymphedema, Sickle Cell Disease Respiratory Patient has history of Chronic Obstructive Pulmonary Disease (COPD) Denies history of Aspiration, Asthma, Pneumothorax, Sleep Apnea, Tuberculosis Cardiovascular Patient has history of Arrhythmia, Hypertension, Myocardial Infarction Gastrointestinal Denies history of Cirrhosis , Colitis, Crohn s, Hepatitis A, Hepatitis B, Hepatitis C Endocrine Patient has history of Type II Diabetes Genitourinary Patient has history of End Stage Renal Disease Immunological Denies history of Lupus Erythematosus, Raynaud s, Scleroderma Integumentary (Skin) Denies history of History of Burn, History of pressure wounds Musculoskeletal Patient has history of Gout, Osteoarthritis Denies history of Osteomyelitis Neurologic Patient has history of Neuropathy Oncologic Denies history of Received Chemotherapy, Received Radiation Patient is treated with Insulin. Blood sugar is not tested. Blood sugar results noted at the following times: Breakfast - 78. Medical And Surgical History Notes Cardiovascular hyperlipidemia Review of Systems (ROS) Constitutional Symptoms (General Health) The patient has no complaints or symptoms. Eyes Complains or has symptoms of Glasses / Contacts. Ear/Nose/Mouth/Throat The patient has no complaints or symptoms. Hematologic/Lymphatic SAFWAN, SIROKY ([id number]) The patient has no complaints or symptoms. Respiratory Complains or has symptoms of Shortness of Breath. Cardiovascular The patient has no complaints or symptoms. Gastrointestinal The patient has no complaints or symptoms. Genitourinary Complains or has symptoms of Kidney failure/ Dialysis. Immunological The patient has no complaints or  symptoms. Integumentary (Skin) Complains or has symptoms of Wounds, Breakdown, Swelling. Musculoskeletal Complains or has symptoms of Muscle Weakness. Neurologic The patient has no complaints or symptoms. Oncologic The patient has no complaints or symptoms. Objective Constitutional supine blood pressure is within target range for patient.. pulse regular and within target range for patient.Marland Kitchen respirations regular, non-labored and within target range for patient.Marland Kitchen temperature within target range for patient.. Well-nourished and well-hydrated in no acute distress. Vitals Time Taken: 8:56 AM, Height: 67 in, Source: Stated, Weight: 190 lbs, Source: Stated, BMI: 29.8, Temperature: 97.8 F, Pulse: 112 bpm, Respiratory Rate: 18 breaths/min, Blood Pressure: 131/74 mmHg. Eyes conjunctiva clear no eyelid edema noted. pupils equal round and reactive to light and accommodation. Ears, Nose, Mouth, and Throat no gross abnormality of ear auricles or external auditory canals. normal hearing noted during conversation. mucus membranes moist. Respiratory normal breathing without  difficulty. clear to auscultation bilaterally. Cardiovascular regular rate and rhythm with normal S1, S2. Absent posterior tibial and dorsalis pedis pulses bilateral lower extremities. trace pitting edema. JADIEL, FARB ([id number]) Gastrointestinal (GI) soft, non-tender, non-distended, +BS. No obvious ventral hernia noted at this point. Musculoskeletal Patient unable to walk without assistance. Patient has a noted absence of the left great toe secondary to amputation and a nonhealing ulcer at this location. Psychiatric this patient is able to make decisions and demonstrates good insight into disease process. Alert and Oriented x 3. pleasant and cooperative. Integumentary (Hair, Skin) Wound #1 status is Open. Original cause of wound was Surgical Injury. The wound is located on the Left Toe Great. The wound measures  2.4cm length x 2.3cm width x 0.3cm depth; 4.335cm^2 area and 1.301cm^3 volume. The wound is limited to skin breakdown. There is no tunneling or undermining noted. There is a large amount of serosanguineous drainage noted. The wound margin is distinct with the outline attached to the wound base. There is small (1-33%) granulation within the wound bed. There is a large (67- 100%) amount of necrotic tissue within the wound bed including Adherent Slough. The periwound skin appearance exhibited: Moist. The periwound skin appearance did not exhibit: Callus, Crepitus, Excoriation, Fluctuance, Friable, Induration, Localized Edema, Rash, Scarring, Dry/Scaly, Maceration, Atrophie Blanche, Cyanosis, Ecchymosis, Hemosiderin Staining, Mottled, Pallor, Rubor, Erythema. Periwound temperature was noted as No Abnormality. Wound #2 status is Open. Original cause of wound was Gradually Appeared. The wound is located on the Right,Dorsal [employer] The wound measures 0.7cm length x 0.8cm width x 0.1cm depth; 0.44cm^2 area and 0.044cm^3 volume. The wound is limited to skin breakdown. There is no tunneling or undermining noted. There is a medium amount of serosanguineous drainage noted. The wound margin is distinct with the outline attached to the wound base. There is no granulation within the wound bed. There is a large (67- 100%) amount of necrotic tissue within the wound bed including Adherent Slough. The periwound skin appearance exhibited: Maceration, Moist. The periwound skin appearance did not exhibit: Callus, Crepitus, Excoriation, Fluctuance, Friable, Induration, Localized Edema, Rash, Scarring, Dry/Scaly, Atrophie Blanche, Cyanosis, Ecchymosis, Hemosiderin Staining, Mottled, Pallor, Rubor, Erythema. Periwound temperature was noted as No Abnormality. Wound #3 status is Open. Original cause of wound was Gradually Appeared. The wound is located on the Right,Anterior Lower Leg. The wound measures 5cm length x 6cm  width x 0.1cm depth; 23.562cm^2 area and 2.356cm^3 volume. The wound is limited to skin breakdown. There is no tunneling or undermining noted. There is a small amount of serosanguineous drainage noted. The wound margin is distinct with the outline attached to the wound base. There is no granulation within the wound bed. There is a large (67- 100%) amount of necrotic tissue within the wound bed including Eschar. The periwound skin appearance exhibited: Moist, Hemosiderin Staining, Mottled. The periwound skin appearance did not exhibit: Callus, Crepitus, Excoriation, Fluctuance, Friable, Induration, Localized Edema, Rash, Scarring, Dry/Scaly, Maceration, Atrophie Blanche, Cyanosis, Ecchymosis, Pallor, Rubor, Erythema. Periwound temperature was noted as No Abnormality. Wound #4 status is Open. Original cause of wound was Pressure Injury. The wound is located on the Sacrum. The wound measures 2.5cm length x 5cm width x 0.1cm depth; 9.817cm^2 area and 0.982cm^3 volume. The wound is limited to skin breakdown. There is no tunneling or undermining noted. There is a medium amount of serosanguineous drainage noted. The wound margin is distinct with the outline attached Newmark, Filiberto H. ([id number]) to the wound base. There is no granulation within  the wound bed. There is a large (67-100%) amount of necrotic tissue within the wound bed including Adherent Slough. The periwound skin appearance exhibited: Moist. The periwound skin appearance did not exhibit: Callus, Crepitus, Excoriation, Fluctuance, Friable, Induration, Localized Edema, Rash, Scarring, Dry/Scaly, Maceration, Atrophie Blanche, Cyanosis, Ecchymosis, Hemosiderin Staining, Mottled, Pallor, Rubor, Erythema. 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 May shower with protection. Wound #2 Right,Dorsal Toe Great: Clean wound with Normal Saline. May shower with protection. Wound #3 Right,Anterior Lower Leg: Clean wound with Normal Saline. May shower with protection. Wound #4 Sacrum: Clean wound with Normal Saline. May shower with protection. Anesthetic: Wound #1 Left Toe Great: Topical Lidocaine 4% cream applied to wound bed prior to debridement Wound #2 Right,Dorsal Toe Great: Topical Lidocaine 4% cream applied to wound bed prior to debridement Wound #3 Right,Anterior Lower Leg: Topical Lidocaine 4% cream applied to wound bed prior to debridement KORE, SANLUIS. ([id number]) Wound #4 Sacrum: Topical Lidocaine 4% cream applied to wound bed prior to debridement Skin Barriers/Peri-Wound Care: Wound #1 Left Toe Great: Barrier cream Wound #2 Right,Dorsal Toe Great: Barrier cream Wound #3 Right,Anterior Lower Leg: Barrier cream Wound #4 Sacrum: Barrier cream Primary Wound Dressing: Wound #1 Left Toe Great: Santyl Ointment Wound #2 Right,Dorsal Toe Great: Santyl Ointment Wound #3 Right,Anterior Lower Leg: Sorbalgon Ag Wound #4 Sacrum: Sorbalgon Ag Secondary Dressing: Wound #3 Right,Anterior Lower Leg: Boardered Foam Dressing Wound #4 Sacrum: Boardered Foam Dressing Wound #1 Left Toe Great: Gauze and Kerlix/Conform Wound #2 Right,Dorsal Toe Great: Gauze and Kerlix/Conform Dressing Change Frequency: Wound #3 Right,Anterior Lower Leg: Change dressing every other day. Wound #4 Sacrum: Change dressing every other day. Wound #1 Left Toe  Great: Change dressing every day. Wound #2 Right,Dorsal Toe Great: Change dressing every day. Follow-up Appointments: Wound #1 Left Toe Great: Return Appointment in 1 week. Wound #2 Right,Dorsal Toe Great: Return Appointment in 1 week. Wound #3 Right,Anterior Lower Leg: Return Appointment in 1 week. Wound #4 Sacrum: Return Appointment in 1 week. Off-Loading: Turn and reposition every 2 hours Mattress - Per Patient he is on an air mattress Additional Orders / Instructions: AMELIA, OLESON. ([id number]) Wound #1 Left Toe Great: Increase protein intake. Activity as tolerated Other: - [employer]Inc, Vitamin A, C, MVI Wound #2 Right,Dorsal Toe Great: Increase protein intake. Activity as tolerated Other: - [employer], Vitamin A, C, MVI Wound #3 Right,Anterior Lower Leg: Increase protein intake. Activity as tolerated Other: - ZInc, Vitamin A, C, MVI Wound #4 Sacrum: Increase protein intake. Activity as tolerated Other: - [employer], Vitamin A, C, MVI Follow-Up Appointments: A Patient Clinical Summary of Care was provided to [facility] At this point in time today I discussed with patient and his daughter that I feel Santyl being applied topically to the amputation site as well as to the right toe wound would be ideal. There is necrotic slough that needs to be removed I think this would be the most appropriate way to accomplish this. Right now his ABIs  have not been able to be obtained on his most recent visit on September 14th 2017 with home health vein and vascular specialist. Therefore I'm not going to perform any sharp debbridement at this point in time. in regard to the sacral pressure injury I'm actually going to use silver alginate dressing at this ppoint in time. We will see him for reevaluation in one week. if he has any concerns or questions in the meantime he needs to contact our office he definitely can do so all questions were answered to the best of my ability during the office visit today. we  will see him in one week. Electronic Signature(s) Signed: 01/15/2016 9:39:47 AM By: Worthy Keeler PA-C Entered By: Worthy Keeler on 01/15/2016 09:38:42 TYRIE, MAJID ([id number]) -------------------------------------------------------------------------------- ROS/PFSH Details Patient Name: Mark [name]. Date of Service: 01/14/2016 8:45 AM Medical Record Number: [id number] Patient Account Number: [id number] Date of Birth/Sex: September 21, 1940 (75 y.o. Male) Treating RN: Baruch Gouty, RN, BSN, Velva Harman Primary Care Physician: COX, KIRSTEN Other Clinician: Referring Physician: Harold Barban Treating Physician/Extender: STONE III, Tara Rud Weeks in Treatment: 0 Wound History Do you currently have one or more open woundso Yes How many open wounds do you currently haveo 3 Approximately how long have you had your woundso unsure How have you been treating your wound(s) until nowo dryh dressing Has your wound(s) ever healed and then re-openedo No Have you had any lab work done in the past montho No Have you tested positive for an antibiotic resistant organism (MRSA, VRE)o No Have you tested positive for osteomyelitis (bone infection)o No Have you had any tests for circulation on your legso No Eyes Complaints and Symptoms: Positive for: Glasses / Contacts Respiratory Complaints and Symptoms: Positive for: Shortness of Breath Medical History: Positive for: Chronic Obstructive Pulmonary Disease (COPD) Negative for: Aspiration; Asthma; Pneumothorax; Sleep Apnea; Tuberculosis Genitourinary Complaints and Symptoms: Positive for: Kidney failure/ Dialysis Medical History: Positive for: End Stage Renal Disease Integumentary (Skin) Complaints and Symptoms: Positive for: Wounds; Breakdown; Swelling Medical History: Negative for: History of Burn; History of pressure wounds Musculoskeletal JASMEET, MARON. ([id number]) Complaints and Symptoms: Positive for: Muscle Weakness Medical  History: Positive for: Gout; Osteoarthritis Negative for: Osteomyelitis Constitutional Symptoms (General Health) Complaints and Symptoms: No Complaints or Symptoms Ear/Nose/Mouth/Throat Complaints and Symptoms: No Complaints or Symptoms Hematologic/Lymphatic Complaints and Symptoms: No Complaints or Symptoms Medical History: Negative for: Anemia; Hemophilia; Human Immunodeficiency Virus; Lymphedema; Sickle Cell Disease Cardiovascular Complaints and Symptoms: No Complaints or Symptoms Medical History: Positive for: Arrhythmia; Hypertension; Myocardial Infarction Past Medical History Notes: hyperlipidemia Gastrointestinal Complaints and Symptoms: No Complaints or Symptoms Medical History: Negative for: Cirrhosis ; Colitis; Crohnos; Hepatitis A; Hepatitis B; Hepatitis C Endocrine Medical History: Positive for: Type II Diabetes Time with diabetes: 40 years Treated with: Insulin Blood sugar tested every day: No Blood sugar testing results: HARLEM, SAUBER. ([id number]) Breakfast: 78 Immunological Complaints and Symptoms: No Complaints or Symptoms Medical History: Negative for: Lupus Erythematosus; Raynaudos; Scleroderma Neurologic Complaints and Symptoms: No Complaints or Symptoms Medical History: Positive for: Neuropathy Oncologic Complaints and Symptoms: No Complaints or Symptoms Medical History: Negative for: Received Chemotherapy; Received Radiation Family and Social History Never smoker; Marital Status - Single; Alcohol Use: Never; Drug Use: No History; Caffeine Use: Moderate; Financial Concerns: No; Food, Clothing or Shelter Needs: No; Support System Lacking: No; Transportation Concerns: No; Advanced Directives: Yes (Not Provided); Patient does not want information on Advanced Directives; Do not resuscitate: Yes (Not Provided); Medical Power of Attorney: Yes - [place]  [place] (Not Provided) Electronic Signature(s) Signed: 01/14/2016 4:05:26 PM By: Regan Lemming BSN, RN Signed: 01/15/2016 9:39:47 AM By: Worthy Keeler PA-C Entered By: Regan Lemming on 01/14/2016 08:55:50 COLTER, PAUMEN ([id number]) -------------------------------------------------------------------------------- SuperBill Details Patient Name: Mark [name]. Date of Service: 01/14/2016 Medical Record Number: [id number] Patient Account Number: [id number] Date of Birth/Sex: 06-Jan-1941 (75 y.o. 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, sequela Z89.412 Acquired absence of left great toe L97.512 Non-pressure chronic ulcer of other part of right foot with fat layer exposed L97.522 Non-pressure chronic ulcer of other part of left foot with fat layer exposed Facility Procedures CPT4 Code: [id number] Description: [id number] - WOUND CARE VISIT-LEV 5 EST PT Modifier: Quantity: 1 Physician Procedures CPT4 Code Description: [id number] - WC PHYS LEVEL 4 - NEW PT ICD-10 Description Diagnosis L89.153 Pressure ulcer of sacral region, stage 3 L97.512 Non-pressure chronic ulcer of other part of right fo L97.522 Non-pressure chronic ulcer of other part of  left foo E11.621 Type 2 diabetes mellitus with foot ulcer Modifier: ot with fat lay t with fat laye Quantity: 1 er exposed r exposed Electronic Signature(s) Signed: 01/15/2016 9:39:47 AM By: Worthy Keeler PA-C Entered By: Worthy Keeler on 01/15/2016 09:39:20

## 2016-01-16 DIAGNOSIS — D689 Coagulation defect, unspecified: Secondary | ICD-10-CM | POA: Diagnosis not present

## 2016-01-17 DIAGNOSIS — N170 Acute kidney failure with tubular necrosis: Secondary | ICD-10-CM | POA: Diagnosis not present

## 2016-01-17 DIAGNOSIS — N2581 Secondary hyperparathyroidism of renal origin: Secondary | ICD-10-CM | POA: Diagnosis not present

## 2016-01-20 DIAGNOSIS — N2581 Secondary hyperparathyroidism of renal origin: Secondary | ICD-10-CM | POA: Diagnosis not present

## 2016-01-20 DIAGNOSIS — N170 Acute kidney failure with tubular necrosis: Secondary | ICD-10-CM | POA: Diagnosis not present

## 2016-01-21 ENCOUNTER — Encounter: Payer: No Typology Code available for payment source | Admitting: Internal Medicine

## 2016-01-21 DIAGNOSIS — N186 End stage renal disease: Secondary | ICD-10-CM | POA: Diagnosis not present

## 2016-01-21 DIAGNOSIS — I739 Peripheral vascular disease, unspecified: Secondary | ICD-10-CM | POA: Diagnosis not present

## 2016-01-21 DIAGNOSIS — S98112S Complete traumatic amputation of left great toe, sequela: Secondary | ICD-10-CM | POA: Diagnosis not present

## 2016-01-21 DIAGNOSIS — I4891 Unspecified atrial fibrillation: Secondary | ICD-10-CM | POA: Diagnosis not present

## 2016-01-21 DIAGNOSIS — L97512 Non-pressure chronic ulcer of other part of right foot with fat layer exposed: Secondary | ICD-10-CM | POA: Diagnosis not present

## 2016-01-21 DIAGNOSIS — I509 Heart failure, unspecified: Secondary | ICD-10-CM | POA: Diagnosis not present

## 2016-01-21 DIAGNOSIS — Z89412 Acquired absence of left great toe: Secondary | ICD-10-CM | POA: Diagnosis not present

## 2016-01-21 DIAGNOSIS — L97522 Non-pressure chronic ulcer of other part of left foot with fat layer exposed: Secondary | ICD-10-CM | POA: Diagnosis not present

## 2016-01-21 DIAGNOSIS — I482 Chronic atrial fibrillation: Secondary | ICD-10-CM | POA: Diagnosis not present

## 2016-01-21 DIAGNOSIS — N390 Urinary tract infection, site not specified: Secondary | ICD-10-CM | POA: Diagnosis not present

## 2016-01-21 DIAGNOSIS — E1140 Type 2 diabetes mellitus with diabetic neuropathy, unspecified: Secondary | ICD-10-CM | POA: Diagnosis not present

## 2016-01-21 DIAGNOSIS — L89153 Pressure ulcer of sacral region, stage 3: Secondary | ICD-10-CM | POA: Diagnosis not present

## 2016-01-21 DIAGNOSIS — I5033 Acute on chronic diastolic (congestive) heart failure: Secondary | ICD-10-CM | POA: Diagnosis not present

## 2016-01-21 DIAGNOSIS — E11621 Type 2 diabetes mellitus with foot ulcer: Secondary | ICD-10-CM | POA: Diagnosis not present

## 2016-01-22 ENCOUNTER — Other Ambulatory Visit: Payer: Self-pay

## 2016-01-22 ENCOUNTER — Emergency Department (HOSPITAL_COMMUNITY)
Admission: EM | Admit: 2016-01-22 | Discharge: 2016-01-22 | Disposition: A | Discharge status: Home / Self Care | Payer: Medicare Other | Attending: Emergency Medicine | Admitting: Emergency Medicine

## 2016-01-22 ENCOUNTER — Emergency Department (HOSPITAL_COMMUNITY): Payer: Medicare Other

## 2016-01-22 DIAGNOSIS — Z79899 Other long term (current) drug therapy: Secondary | ICD-10-CM | POA: Diagnosis not present

## 2016-01-22 DIAGNOSIS — I509 Heart failure, unspecified: Secondary | ICD-10-CM | POA: Insufficient documentation

## 2016-01-22 DIAGNOSIS — L97519 Non-pressure chronic ulcer of other part of right foot with unspecified severity: Secondary | ICD-10-CM | POA: Diagnosis not present

## 2016-01-22 DIAGNOSIS — I130 Hypertensive heart and chronic kidney disease with heart failure and stage 1 through stage 4 chronic kidney disease, or unspecified chronic kidney disease: Secondary | ICD-10-CM | POA: Insufficient documentation

## 2016-01-22 DIAGNOSIS — E039 Hypothyroidism, unspecified: Secondary | ICD-10-CM | POA: Diagnosis not present

## 2016-01-22 DIAGNOSIS — Z7901 Long term (current) use of anticoagulants: Secondary | ICD-10-CM | POA: Diagnosis not present

## 2016-01-22 DIAGNOSIS — N189 Chronic kidney disease, unspecified: Secondary | ICD-10-CM

## 2016-01-22 DIAGNOSIS — R404 Transient alteration of awareness: Secondary | ICD-10-CM | POA: Diagnosis not present

## 2016-01-22 DIAGNOSIS — I129 Hypertensive chronic kidney disease with stage 1 through stage 4 chronic kidney disease, or unspecified chronic kidney disease: Secondary | ICD-10-CM | POA: Diagnosis not present

## 2016-01-22 DIAGNOSIS — Z794 Long term (current) use of insulin: Secondary | ICD-10-CM | POA: Insufficient documentation

## 2016-01-22 DIAGNOSIS — R0600 Dyspnea, unspecified: Secondary | ICD-10-CM | POA: Diagnosis not present

## 2016-01-22 DIAGNOSIS — E1140 Type 2 diabetes mellitus with diabetic neuropathy, unspecified: Secondary | ICD-10-CM | POA: Insufficient documentation

## 2016-01-22 DIAGNOSIS — E11621 Type 2 diabetes mellitus with foot ulcer: Secondary | ICD-10-CM | POA: Insufficient documentation

## 2016-01-22 DIAGNOSIS — R531 Weakness: Secondary | ICD-10-CM | POA: Diagnosis not present

## 2016-01-22 LAB — CBC
HCT: 38.9 % — ABNORMAL LOW (ref 39.0–52.0)
HEMOGLOBIN: 13 g/dL (ref 13.0–17.0)
MCH: 28.7 pg (ref 26.0–34.0)
MCHC: 33.4 g/dL (ref 30.0–36.0)
MCV: 85.9 fL (ref 78.0–100.0)
Platelets: 225 10*3/uL (ref 150–400)
RBC: 4.53 MIL/uL (ref 4.22–5.81)
RDW: 16.2 % — AB (ref 11.5–15.5)
WBC: 6.9 10*3/uL (ref 4.0–10.5)

## 2016-01-22 LAB — COMPREHENSIVE METABOLIC PANEL
ALK PHOS: 51 U/L (ref 38–126)
ALT: 13 U/L — ABNORMAL LOW (ref 17–63)
ANION GAP: 11 (ref 5–15)
AST: 30 U/L (ref 15–41)
Albumin: 2.5 g/dL — ABNORMAL LOW (ref 3.5–5.0)
BILIRUBIN TOTAL: 1.2 mg/dL (ref 0.3–1.2)
BUN: 18 mg/dL (ref 6–20)
CALCIUM: 8.8 mg/dL — AB (ref 8.9–10.3)
CO2: 23 mmol/L (ref 22–32)
Chloride: 102 mmol/L (ref 101–111)
Creatinine, Ser: 4.79 mg/dL — ABNORMAL HIGH (ref 0.61–1.24)
GFR calc non Af Amer: 11 mL/min — ABNORMAL LOW (ref >60)
GFR, EST AFRICAN AMERICAN: 12 mL/min — AB (ref >60)
Glucose, Bld: 130 mg/dL — ABNORMAL HIGH (ref 65–99)
POTASSIUM: 3.3 mmol/L — AB (ref 3.5–5.1)
SODIUM: 136 mmol/L (ref 135–145)
TOTAL PROTEIN: 6.1 g/dL — AB (ref 6.5–8.1)

## 2016-01-22 LAB — TROPONIN I: Troponin I: 0.05 ng/mL (ref <0.03)

## 2016-01-22 NOTE — ED Notes (Signed)
Patient transported to X-ray 

## 2016-01-22 NOTE — ED Provider Notes (Signed)
[facility] DEPT Provider Note   CSN: [id number] Arrival date & time: 01/22/16  1928     History   Chief Complaint Chief Complaint  Patient presents with  . Weakness    HPI Mark [name] is a 75 y.o. male.  HPI Pt has a complex medical history.  He had been in the hospital for a month.  He was then admitted to a nursing facility to recover.  He is now is on dialysis.  He last went on Monday.  He is switching to Thursday because he switched center. He complained of some weakness during the day.  Home health checked his oxygen level and it was low.  He called his nephrologist and was told to come to the hospital.  Past Medical History:  Diagnosis Date  . Atrial fibrillation ([place])   . Diabetes ([place])   . Gout   . Hypertension   . Hypothyroidism   . Renal disorder    kidney transplant   . Ulcer of other part of foot ([place]) 12/15/2013    Patient Active Problem List   Diagnosis Date Noted  . Renal failure   . Melena   . CHF (congestive heart failure) ([place])   . Bibasilar crackles   . Aspiration of liquid   . SOB (shortness of breath)   . Permanent atrial fibrillation ([place])   . Renal transplant recipient 10/28/2015  . Diabetic ulcer of left great toe ([place]) 10/28/2015  . Gangrene of toe ([place]) 10/28/2015  . Cellulitis of great toe of left foot 10/28/2015  . Blister of foot with infection 02/22/2015  . Diabetic peripheral neuropathy ([place]) 06/12/2014  . Hammer toe of right foot 02/16/2014  . Onychomycosis 02/02/2014  . Pain in lower limb 02/02/2014  . Ulcer of right foot ([place]) 01/19/2014  . Ulcer of right ankle ([place]) 01/08/2014  . Ulcer of other part of foot 12/15/2013  . Type II diabetes mellitus, uncontrolled ([place]) 10/20/2012  . Other and unspecified hyperlipidemia 10/20/2012  . Chronic kidney disease, stage III (moderate) 10/20/2012  . Unspecified hypothyroidism 10/20/2012  . Edema extremities 10/20/2012  . Gout 10/20/2012    Past Surgical History:  Procedure  Laterality Date  . AMPUTATION Left 11/06/2015   Procedure: AMPUTATION LEFT GREAT TOE;  Surgeon: Elam Dutch, MD;  Location: [place];  Service: Vascular;  Laterality: Left;  . ANKLE FRACTURE SURGERY    . AV FISTULA PLACEMENT Left 11/19/2015   Procedure: LEFT ARM ARTERIOVENOUS (AV) FISTULA CREATION;  Surgeon: Waynetta Sandy, MD;  Location: [place];  Service: Vascular;  Laterality: Left;  . CATARACT EXTRACTION    . INSERTION OF DIALYSIS CATHETER Right 11/19/2015   Procedure: INSERTION OF DIALYSIS CATHETER RIGHT INTERNAL JUGULAR;  Surgeon: Waynetta Sandy, MD;  Location: [place];  Service: Vascular;  Laterality: Right;  . IR GENERIC HISTORICAL  11/14/2015   IR FLUORO GUIDE CV LINE RIGHT 11/14/2015 Markus Daft, MD [facility]  . IR GENERIC HISTORICAL  11/14/2015   IR US GUIDE VASC ACCESS RIGHT 11/14/2015 Markus Daft, MD [facility]  . MELANOMA EXCISION    . NEPHRECTOMY TRANSPLANTED ORGAN    . PERIPHERAL VASCULAR CATHETERIZATION N/A 11/05/2015   Procedure: Abdominal Aortogram w/Lower Extremity;  Surgeon: Serafina Mitchell, MD;  Location: [facility];  Service: Cardiovascular;  Laterality: N/A;  . PERIPHERAL VASCULAR CATHETERIZATION Left 11/05/2015   Procedure: Peripheral Vascular Atherectomy;  Surgeon: Serafina Mitchell, MD;  Location: [facility];  Service: Cardiovascular;  Laterality: Left;  popiteal  . PERIPHERAL VASCULAR CATHETERIZATION Left 11/05/2015   Procedure: Peripheral Vascular Balloon Angioplasty;  Surgeon: Serafina Mitchell, MD;  Location: [facility];  Service: Cardiovascular;  Laterality: Left;  anterior tib       Home Medications    Prior to Admission medications   Medication Sig Start Date End Date Taking? Authorizing Provider  acetaminophen (TYLENOL) 500 MG tablet Take 500 mg by mouth every 6 (six) hours as needed for mild pain or moderate pain.   Yes Historical Provider, MD  allopurinol (ZYLOPRIM) 300 MG tablet Take 150 mg by mouth in the morning 11/26/15   Yes Silver Huguenin Elgergawy, MD  fenofibrate micronized (LOFIBRA) 134 MG capsule TAKE ONE CAPSULE EVERY DAY WITH A MEAL 07/11/15  Yes Elayne Snare, MD  HYDROcodone-acetaminophen (NORCO/VICODIN) 5-325 MG tablet Take 1-2 tablets by mouth every 4 (four) hours as needed for moderate pain. 11/26/15  Yes Silver Huguenin Elgergawy, MD  insulin aspart (NOVOLOG) 100 UNIT/ML injection Inject 0-15 Units into the skin 3 (three) times daily with meals. 11/26/15  Yes Silver Huguenin Elgergawy, MD  insulin glargine (LANTUS) 100 UNIT/ML injection Inject 0.26 mLs (26 Units total) into the skin at bedtime. 11/26/15  Yes Silver Huguenin Elgergawy, MD  ketoconazole (NIZORAL) 2 % shampoo Apply 1 application topically 2 (two) times a week.  12/14/13  Yes Historical Provider, MD  levalbuterol Penne Lash) 1.25 MG/0.5ML nebulizer solution Take 1.25 mg by nebulization every 6 (six) hours as needed for wheezing or shortness of breath. 11/26/15  Yes Albertine Patricia, MD  levothyroxine (SYNTHROID, LEVOTHROID) 137 MCG tablet Take 137 mcg by mouth daily before breakfast.   Yes Historical Provider, MD  metoprolol succinate (TOPROL-XL) 50 MG 24 hr tablet Take 50 mg by mouth daily.  06/18/12  Yes Historical Provider, MD  multivitamin (RENA-VIT) TABS tablet Take 1 tablet by mouth at bedtime. 11/26/15  Yes Silver Huguenin Elgergawy, MD  nystatin (MYCOSTATIN/NYSTOP) 100000 UNIT/GM POWD Apply 1 g topically daily as needed (irritation).  12/14/12  Yes Historical Provider, MD  pantoprazole (PROTONIX) 40 MG tablet Take 1 tablet (40 mg total) by mouth 2 (two) times daily. 11/26/15  Yes Silver Huguenin Elgergawy, MD  predniSONE (DELTASONE) 5 MG tablet Take 5 mg by mouth daily. 03/15/14  Yes Historical Provider, MD  rosuvastatin (CRESTOR) 10 MG tablet Take 10 mg by mouth every evening.   Yes Historical Provider, MD  tacrolimus (PROGRAF) 0.5 MG capsule Take 0.5 mg by mouth 2 (two) times daily.   Yes Historical Provider, MD  tamsulosin (FLOMAX) 0.4 MG CAPS capsule Take 1 capsule (0.4 mg total) by  mouth daily after supper. 11/26/15  Yes Albertine Patricia, MD  warfarin (COUMADIN) 5 MG tablet Take 0.5-1 tablets (2.5-5 mg total) by mouth See admin instructions. 5 mg in the evening on Sun/Mon/Wed/Fri/Sat and 2.5 mg on Tues/Thurs 11/27/15  Yes Dawood S Elgergawy, MD  ciprofloxacin (CIPRO) 500 MG tablet Take 1 tablet (500 mg total) by mouth 2 (two) times daily. Patient not taking: Reported on 01/22/2016 12/19/15   Sharmon Leyden Nickel, NP  feeding supplement (BOOST / RESOURCE BREEZE) LIQD Take 1 Container by mouth 3 (three) times daily with meals. Patient not taking: Reported on 01/22/2016 11/26/15   Albertine Patricia, MD    Family History Family History  Problem Relation Age of Onset  . Bradycardia Mother   . Stroke Father     Social History Social History  Substance Use Topics  . Smoking status: Never Smoker  . Smokeless tobacco:  Never Used  . Alcohol use No     Allergies   Tape   Review of Systems Review of Systems  Constitutional: Negative for fever.  Respiratory: Negative for choking.   Cardiovascular: Negative for chest pain.  Gastrointestinal: Negative for diarrhea and vomiting.  All other systems reviewed and are negative.    Physical Exam Updated Vital Signs BP (!) 147/37   Pulse 84   Temp 97.9 F (36.6 C) (Oral)   Resp 19   Ht 5\' 9"  (1.753 m)   Wt 73.5 kg   SpO2 100%   BMI 23.92 kg/m   Physical Exam  Constitutional: He appears well-developed and well-nourished. No distress.  HENT:  Head: Normocephalic and atraumatic.  Right Ear: External ear normal.  Left Ear: External ear normal.  Eyes: Conjunctivae are normal. Right eye exhibits no discharge. Left eye exhibits no discharge. No scleral icterus.  Neck: Neck supple. No tracheal deviation present.  Cardiovascular: Intact distal pulses.  An irregularly irregular rhythm present.  Pulmonary/Chest: Effort normal and breath sounds normal. No stridor. No respiratory distress. He has no wheezes. He has no  rales.  Abdominal: Soft. Bowel sounds are normal. He exhibits no distension. There is no tenderness. There is no rebound and no guarding.  Musculoskeletal: He exhibits no edema or tenderness.  S/p toe amputation in feet  Neurological: He is alert. He has normal strength. No cranial nerve deficit (no facial droop, extraocular movements intact, no slurred speech) or sensory deficit. He exhibits normal muscle tone. He displays no seizure activity. Coordination normal.  Moves all extremities  Skin: Skin is warm and dry. No rash noted. He is not diaphoretic.  Psychiatric: He has a normal mood and affect.  Nursing note and vitals reviewed.    ED Treatments / Results  Labs (all labs ordered are listed, but only abnormal results are displayed) Labs Reviewed  CBC - Abnormal; Notable for the following:       Result Value   HCT 38.9 (*)    RDW 16.2 (*)    All other components within normal limits  COMPREHENSIVE METABOLIC PANEL - Abnormal; Notable for the following:    Potassium 3.3 (*)    Glucose, Bld 130 (*)    Creatinine, Ser 4.79 (*)    Calcium 8.8 (*)    Total Protein 6.1 (*)    Albumin 2.5 (*)    ALT 13 (*)    GFR calc non Af Amer 11 (*)    GFR calc Af Amer 12 (*)    All other components within normal limits  TROPONIN I - Abnormal; Notable for the following:    Troponin I 0.05 (*)    All other components within normal limits    EKG  EKG Interpretation  Date/Time:  Wednesday January 22 2016 19:32:09 EDT Ventricular Rate:  110 PR Interval:    QRS Duration: 99 QT Interval:  369 QTC Calculation: 486 R Axis:   2 Text Interpretation:  Atrial fibrillation Anteroseptal infarct, age indeterminate Baseline wander in lead(s) I II aVR lbbb not present on current ECG compared to last Confirmed by Omah Dewalt  MD-J, Tekila Caillouet [id number]) on 01/22/2016 7:37:06 PM       Radiology Dg Chest 2 View  Result Date: 01/22/2016 CLINICAL DATA:  Dyspnea EXAM: CHEST  2 VIEW COMPARISON:  11/19/2015 FINDINGS:  The heart size and mediastinal contours are within normal limits. Both lungs are clear. Clearing of CHF since prior exam. Right IJ dialysis catheter with tip at the cavoatrial  junction. The visualized skeletal structures are unremarkable. IMPRESSION: No active cardiopulmonary disease. Electronically Signed   By: Ashley Royalty M.D.   On: 01/22/2016 21:29    Procedures Procedures (including critical care time)  Medications Ordered in ED Medications - No data to display   Initial Impression / Assessment and Plan / ED Course  I have reviewed the triage vital signs and the nursing notes.  Pertinent labs & imaging results that were available during my care of the patient were reviewed by me and considered in my medical decision making (see chart for details).  Clinical Course  Comment By Time  CXR normal. Oxygen sat remains normal Dorie Rank, MD 10/18 2145    Patient was monitored in the emergency room for several hours. He has not been hypoxic. His saturations have been 99-100% without any supplemental oxygen. Laboratory tests show a stable chronic renal failure without any acute changes. Chest x-ray does not show any pulmonary edema. EKG shows A. fib without rapid ventricular rate.  I think  the patient is stable for outpatient treatment and does not require hospitalization.  I spoke with Dr. Marval Regal who was on call for nephrology.   Pt will go to dialysis tomorrow as an outpatient as planned  Final Clinical Impressions(s) / ED Diagnoses   Final diagnoses:  Chronic renal failure, unspecified CKD stage    New Prescriptions New Prescriptions   No medications on file     Dorie Rank, MD 01/22/16 2219

## 2016-01-22 NOTE — ED Notes (Addendum)
Attempted to start IV unable to complete

## 2016-01-22 NOTE — Progress Notes (Signed)
Mark [name], Mark [name] ([id number]) Visit Report for 01/21/2016 Arrival Information Details Patient Name: Mark [name], Mark [name]. Date of Service: 01/21/2016 10:45 AM Medical Record Number: [id number] Patient Account Number: [id number] Date of Birth/Sex: [birth year] (75 y.o. Male) Treating RN: Baruch Gouty, RN, BSN, Velva Harman Primary Care Physician: COX, KIRSTEN Other Clinician: Referring Physician: COX, KIRSTEN Treating Physician/Extender: Tito Dine in Treatment: 1 Visit Information History Since Last Visit All ordered tests and consults were completed: No Patient Arrived: Wheel Chair Added or deleted any medications: No Arrival Time: 10:43 Any new allergies or adverse reactions: No Accompanied By: self Had a fall or experienced change in No activities of daily living that may affect Transfer Assistance: None risk of falls: Patient Identification Verified: Yes Signs or symptoms of abuse/neglect since last No Secondary Verification Process Yes visito Completed: Hospitalized since last visit: No Patient Requires Transmission-Based No Has Dressing in Place as Prescribed: Yes Precautions: Pain Present Now: No Patient Has Alerts: No Electronic Signature(s) Signed: 01/21/2016 5:26:45 PM By: Regan Lemming BSN, RN Entered By: Regan Lemming on 01/21/2016 10:43:38 Mark [name] ([id number]) -------------------------------------------------------------------------------- Encounter Discharge Information Details Patient Name: Mark [name]. Date of Service: 01/21/2016 10:45 AM Medical Record Number: [id number] Patient Account Number: [id number] Date of Birth/Sex: 10/09/1940 (75 y.o. Male) Treating RN: Baruch Gouty, RN, BSN, Velva Harman Primary Care Physician: COX, KIRSTEN Other Clinician: Referring Physician: COX, KIRSTEN Treating Physician/Extender: Tito Dine in Treatment: 1 Encounter Discharge Information Items Discharge Pain Level: 0 Discharge Condition: Stable Ambulatory  Status: Ambulatory Discharge Destination: Home Transportation: Private Auto Accompanied By: niece Schedule Follow-up Appointment: No Medication Reconciliation completed and provided to Patient/Care No Adarius Tigges: Provided on Clinical Summary of Care: 01/21/2016 Form Type Recipient Paper Patient [facility] Electronic Signature(s) Signed: 01/21/2016 5:26:45 PM By: Regan Lemming BSN, RN Previous Signature: 01/21/2016 11:33:08 AM Version By: Ruthine Dose Entered By: Regan Lemming on 01/21/2016 11:36:34 Hartog, Cory Roughen ([id number]) -------------------------------------------------------------------------------- Lower Extremity Assessment Details Patient Name: Mark [name]. Date of Service: 01/21/2016 10:45 AM Medical Record Number: [id number] Patient Account Number: [id number] Date of Birth/Sex: 1940/09/09 (75 y.o. Male) Treating RN: Baruch Gouty, RN, BSN, Velva Harman Primary Care Physician: COX, KIRSTEN Other Clinician: Referring Physician: COX, KIRSTEN Treating Physician/Extender: Ricard Dillon Weeks in Treatment: 1 Vascular Assessment Pulses: Posterior Tibial Dorsalis Pedis Palpable: [Left:Yes] [Right:Yes] Extremity colors, hair growth, and conditions: Extremity Color: [Left:Mottled] [Right:Mottled] Hair Growth on Extremity: [Left:No] [Right:No] Temperature of Extremity: [Left:Warm] [Right:Warm] Capillary Refill: [Left:< 3 seconds] [Right:< 3 seconds] Toe Nail Assessment Left: Right: Thick: Yes Yes Discolored: Yes Yes Deformed: No No Improper Length and Hygiene: Yes Yes Electronic Signature(s) Signed: 01/21/2016 5:26:45 PM By: Regan Lemming BSN, RN Entered By: Regan Lemming on 01/21/2016 10:44:12 Mark [name] ([id number]) -------------------------------------------------------------------------------- Multi Wound Chart Details Patient Name: Mark [name]. Date of Service: 01/21/2016 10:45 AM Medical Record Number: [id number] Patient Account Number: [id number] Date of  Birth/Sex: [birth year] (75 y.o. Male) Treating RN: Baruch Gouty, RN, BSN, Velva Harman Primary Care Physician: COX, KIRSTEN Other Clinician: Referring Physician: COX, KIRSTEN Treating Physician/Extender: Ricard Dillon Weeks in Treatment: 1 Vital Signs Height(in): 67 Pulse(bpm): 100 Weight(lbs): 190 Blood Pressure 130/77 (mmHg): Body Mass Index(BMI): 30 Temperature(F): 97.8 Respiratory Rate 16 (breaths/min): Photos: [1:No Photos] [2:No Photos] [3:No Photos] Wound Location: [1:Left Toe Great] [2:Right Toe Great - Dorsal Right Lower Leg - Anterior] Wounding Event: [1:Surgical Injury] [2:Gradually Appeared] [3:Gradually Appeared] 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] [2:the Lower Extremity] Comorbid History: [1:Chronic Obstructive Pulmonary Disease (COPD), Arrhythmia, Hypertension, Myocardial Hypertension,  Myocardial Hypertension, Myocardial Infarction, Type II Diabetes, End Stage Renal Disease, Gout, Osteoarthritis, Neuropathy  Osteoarthritis, Neuropathy Osteoarthritis, Neuropathy] [2:Chronic Obstructive Pulmonary Disease (COPD), Arrhythmia, Infarction, Type II Diabetes, End Stage Renal Disease, Gout,] [3:Chronic Obstructive Pulmonary Disease (COPD), Arrhythmia, Infarction,  Type II Diabetes, End Stage Renal Disease, Gout,] Date Acquired: [1:10/16/2015] [2:12/10/2015] [3:12/10/2015] Weeks of Treatment: [1:1] [2:1] [3:1] Wound Status: [1:Open] [2:Open] [3:Healed - Epithelialized] Measurements L x W x D 1.5x1.8x0.2 [2:0.4x0.5x0.1] [3:0x0x0] (cm) Area (cm) : [1:2.121] [2:0.157] [3:0] Volume (cm) : [1:0.424] [2:0.016] [3:0] % Reduction in Area: [1:51.10%] [2:64.30%] [3:100.00%] % Reduction in Volume: 67.40% [2:63.60%] [3:100.00%] Classification: [1:Grade 1] [2:Grade 1] [3:Partial Thickness] HBO Classification: [1:N/A] [2:N/A] [3:Grade 0] Exudate Amount: [1:Large] [2:Medium] [3:Small] Exudate Type: [1:Serosanguineous]  [2:Serosanguineous] [3:Serosanguineous] Exudate Color: [1:red, brown] [2:red, brown] [3:red, brown] Wound Margin: [1:Distinct, outline attached Distinct, outline attached Distinct, outline attached] Granulation Amount: [1:Small (1-33%)] [2:None Present (0%)] [3:None Present (0%)] Necrotic Amount: [1:Large (67-100%)] [2:Large (67-100%)] [3:None Present (0%)] Exposed Structures: Fascia: No Fascia: No Fascia: No Fat: No Fat: No Fat: No Tendon: No Tendon: No Tendon: No Muscle: No Muscle: No Muscle: No Joint: No Joint: No Joint: No Bone: No Bone: No Bone: No Limited to Skin Limited to Skin Limited to Skin Breakdown Breakdown Breakdown Epithelialization: None None Large (67-100%) Periwound Skin Texture: Edema: No Edema: No Edema: No Excoriation: No Excoriation: No Excoriation: No Induration: No Induration: No Induration: No Callus: No Callus: No Callus: No Crepitus: No Crepitus: No Crepitus: No Fluctuance: No Fluctuance: No Fluctuance: No Friable: No Friable: No Friable: No Rash: No Rash: No Rash: No Scarring: No Scarring: No Scarring: No Periwound Skin Moist: Yes Maceration: Yes Dry/Scaly: Yes Moisture: Maceration: No Moist: Yes Maceration: No Dry/Scaly: No Dry/Scaly: No Moist: No Periwound Skin Color: Atrophie Blanche: No Atrophie Blanche: No Hemosiderin Staining: Yes Cyanosis: No Cyanosis: No Mottled: Yes Ecchymosis: No Ecchymosis: No Atrophie Blanche: No Erythema: No Erythema: No Cyanosis: No Hemosiderin Staining: No Hemosiderin Staining: No Ecchymosis: No Mottled: No Mottled: No Erythema: No Pallor: No Pallor: No Pallor: No Rubor: No Rubor: No Rubor: No Temperature: No Abnormality No Abnormality No Abnormality Tenderness on No No No Palpation: Wound Preparation: Ulcer Cleansing: Ulcer Cleansing: Ulcer Cleansing: Rinsed/Irrigated with Rinsed/Irrigated with Rinsed/Irrigated with Saline Saline Saline Topical Anesthetic Topical  Anesthetic Topical Anesthetic Applied: Other: lidocaine Applied: Other: lidocaine Applied: None 4% 4% Wound Number: 4 N/A N/A Photos: No Photos N/A N/A Wound Location: Sacrum N/A N/A Wounding Event: Pressure Injury N/A N/A Primary Etiology: Pressure Ulcer N/A N/A Comorbid History: Chronic Obstructive N/A N/A Pulmonary Disease (COPD), Arrhythmia, Hypertension, Myocardial Infarction, Type II Rickerson, Chelsea H. ([id number]) Diabetes, End Stage Renal Disease, Gout, Osteoarthritis, Neuropathy Date Acquired: 11/12/2015 N/A N/A Weeks of Treatment: 1 N/A N/A Wound Status: Open N/A N/A Measurements L x W x D 1.5x0.7x0.1 N/A N/A (cm) Area (cm) : 0.825 N/A N/A Volume (cm) : 0.082 N/A N/A % Reduction in Area: 91.60% N/A N/A % Reduction in Volume: 91.60% N/A N/A Classification: Category/Stage II N/A N/A HBO Classification: N/A N/A N/A Exudate Amount: Medium N/A N/A Exudate Type: Serosanguineous N/A N/A Exudate Color: red, brown N/A N/A Wound Margin: Distinct, outline attached N/A N/A Granulation Amount: Large (67-100%) N/A N/A Necrotic Amount: None Present (0%) N/A N/A Exposed Structures: Fascia: No N/A N/A Fat: No Tendon: No Muscle: No Joint: No Bone: No Limited to Skin Breakdown Epithelialization: Small (1-33%) N/A N/A Periwound Skin Texture: Edema: No N/A N/A Excoriation: No Induration: No Callus: No Crepitus: No Fluctuance: No Friable: No Rash:  No Scarring: No Periwound Skin Moist: Yes N/A N/A Moisture: Maceration: No Dry/Scaly: No Periwound Skin Color: Atrophie Blanche: No N/A N/A Cyanosis: No Ecchymosis: No Erythema: No Hemosiderin Staining: No Mottled: No Pallor: No Rubor: No Temperature: N/A N/A N/A Griesinger, Treylen H. ([id number]) Tenderness on No N/A N/A Palpation: Wound Preparation: Ulcer Cleansing: N/A N/A Rinsed/Irrigated with Saline Topical Anesthetic Applied: Other: Lidocaine 4% Treatment Notes Electronic Signature(s) Signed: 01/21/2016  5:26:45 PM By: Regan Lemming BSN, RN Entered By: Regan Lemming on 01/21/2016 10:52:19 KAMARIEN, LANGRIDGE ([id number]) -------------------------------------------------------------------------------- Multi-Disciplinary Care Plan Details Patient Name: Mark [name]. Date of Service: 01/21/2016 10:45 AM Medical Record Number: [id number] Patient Account Number: [id number] Date of Birth/Sex: 09-20-1940 (75 y.o. Male) Treating RN: Baruch Gouty, RN, BSN, Velva Harman Primary Care Physician: COX, KIRSTEN Other Clinician: Referring Physician: COX, KIRSTEN Treating Physician/Extender: Tito Dine in Treatment: 1 Active Inactive Orientation to the Wound Care Program Nursing Diagnoses: Knowledge deficit related to the wound healing center program Goals: Patient/caregiver will verbalize understanding of the [place] Program Date Initiated: 01/14/2016 Goal Status: Active Interventions: Provide education on orientation to the wound center Notes: Venous Leg Ulcer Nursing Diagnoses: Knowledge deficit related to disease process and management Potential for venous Insuffiency (use before diagnosis confirmed) Goals: Patient will maintain optimal edema control Date Initiated: 01/14/2016 Goal Status: Active Patient/caregiver will verbalize understanding of disease process and disease management Date Initiated: 01/14/2016 Goal Status: Active Verify adequate tissue perfusion prior to therapeutic compression application Date Initiated: 01/14/2016 Goal Status: Active Interventions: Assess peripheral edema status every visit. Provide education on venous insufficiency Notes: JERRILL, DIFRANCO ([id number]) Wound/Skin Impairment Nursing Diagnoses: Impaired tissue integrity Knowledge deficit related to ulceration/compromised skin integrity Goals: Patient/caregiver will verbalize understanding of skin care regimen Date Initiated: 01/14/2016 Goal Status: Active Ulcer/skin breakdown will  have a volume reduction of 30% by week 4 Date Initiated: 01/14/2016 Goal Status: Active Ulcer/skin breakdown will have a volume reduction of 50% by week 8 Date Initiated: 01/14/2016 Goal Status: Active Ulcer/skin breakdown will have a volume reduction of 80% by week 12 Date Initiated: 01/14/2016 Goal Status: Active Ulcer/skin breakdown will heal within 14 weeks Date Initiated: 01/14/2016 Goal Status: Active Interventions: Assess patient/caregiver ability to obtain necessary supplies Assess patient/caregiver ability to perform ulcer/skin care regimen upon admission and as needed Assess ulceration(s) every visit Provide education on ulcer and skin care Treatment Activities: Skin care regimen initiated : 01/14/2016 Topical wound management initiated : 01/14/2016 Notes: Electronic Signature(s) Signed: 01/21/2016 5:26:45 PM By: Regan Lemming BSN, RN Entered By: Regan Lemming on 01/21/2016 10:52:11 Mark [name] ([id number]) -------------------------------------------------------------------------------- Pain Assessment Details Patient Name: Mark [name]. Date of Service: 01/21/2016 10:45 AM Medical Record Number: [id number] Patient Account Number: [id number] Date of Birth/Sex: June 03, 1940 (75 y.o. Male) Treating RN: Baruch Gouty, RN, BSN, Velva Harman Primary Care Physician: Rochel Brome Other Clinician: Referring Physician: COX, KIRSTEN Treating Physician/Extender: Ricard Dillon Weeks in Treatment: 1 Active Problems Location of Pain Severity and Description of Pain Patient Has Paino No Site Locations With Dressing Change: No Pain Management and Medication Current Pain Management: Electronic Signature(s) Signed: 01/21/2016 5:26:45 PM By: Regan Lemming BSN, RN Entered By: Regan Lemming on 01/21/2016 10:43:45 Mark [name] ([id number]) -------------------------------------------------------------------------------- Patient/Caregiver Education Details Patient Name: Mark [name]. Date of Service: 01/21/2016 10:45 AM Medical Record Number: [id number] Patient Account Number: [id number] Date of Birth/Gender: 05-08-1940 (75 y.o. Male) Treating RN: Baruch Gouty, RN, BSN, Velva Harman Primary Care Physician: Rochel Brome Other Clinician: Referring Physician: COX, KIRSTEN Treating Physician/Extender: Ricard Dillon  Weeks in Treatment: 1 Education Assessment Education Provided To: Patient Education Topics Provided Basic Hygiene: Methods: Explain/Verbal Responses: State content correctly Venous: Methods: Explain/Verbal Responses: State content correctly Welcome To [facility] [place]: Methods: Explain/Verbal Responses: State content correctly Wound/Skin Impairment: Methods: Explain/Verbal Responses: State content correctly Electronic Signature(s) Signed: 01/21/2016 5:26:45 PM By: Regan Lemming BSN, RN Entered By: Regan Lemming on 01/21/2016 11:36:56 Mark [name] ([id number]) -------------------------------------------------------------------------------- Wound Assessment Details Patient Name: [name] H. Date of Service: 01/21/2016 10:45 AM Medical Record Number: [id number] Patient Account Number: [id number] Date of Birth/Sex: [birth year] (75 y.o. Male) Treating RN: Baruch Gouty, RN, BSN, [place] Primary Care Physician: COX, KIRSTEN Other Clinician: Referring Physician: COX, KIRSTEN Treating Physician/Extender: Ricard Dillon Weeks in Treatment: 1 Wound Status Wound Number: 1 Primary Diabetic Wound/Ulcer of the Lower Etiology: Extremity Wound Location: Left Toe Great Wound Open Wounding Event: Surgical Injury Status: Date Acquired: 10/16/2015 Comorbid Chronic Obstructive Pulmonary Disease Weeks Of Treatment: 1 History: (COPD), Arrhythmia, Hypertension, Clustered Wound: No Myocardial Infarction, Type II Diabetes, End Stage Renal Disease, Gout, Osteoarthritis, Neuropathy Photos Photo Uploaded By: Regan Lemming on 01/21/2016 17:15:13 Wound  Measurements Length: (cm) 1.5 Width: (cm) 1.8 Depth: (cm) 0.2 Area: (cm) 2.121 Volume: (cm) 0.424 % Reduction in Area: 51.1% % Reduction in Volume: 67.4% Epithelialization: None Tunneling: No Undermining: No Wound Description Classification: Grade 1 Wound Margin: Distinct, outline attached Exudate Amount: Large Exudate Type: Serosanguineous Exudate Color: red, brown Foul Odor After Cleansing: No Wound Bed Granulation Amount: Small (1-33%) Exposed Structure Necrotic Amount: Large (67-100%) Fascia Exposed: No Necrotic Quality: Adherent Slough Fat Layer Exposed: No Tendon Exposed: No Gail, Lemar H. ([id number]) Muscle Exposed: No Joint Exposed: No Bone Exposed: No Limited to Skin Breakdown Periwound Skin Texture Texture Color No Abnormalities Noted: No No Abnormalities Noted: No Callus: No Atrophie Blanche: No Crepitus: No Cyanosis: No Excoriation: No Ecchymosis: No Fluctuance: No Erythema: No Friable: No Hemosiderin Staining: No Induration: No Mottled: No Localized Edema: No Pallor: No Rash: No Rubor: No Scarring: No Temperature / Pain Moisture Temperature: No Abnormality No Abnormalities Noted: No Dry / Scaly: No Maceration: No Moist: Yes Wound Preparation Ulcer Cleansing: Rinsed/Irrigated with Saline Topical Anesthetic Applied: Other: lidocaine 4%, Treatment Notes Wound #1 (Left Toe Great) 1. Cleansed with: Clean wound with Normal Saline 4. Dressing Applied: Santyl Ointment 5. Secondary Dressing Applied Gauze and Kerlix/Conform 7. Secured with Recruitment consultant) Signed: 01/21/2016 5:26:45 PM By: Regan Lemming BSN, RN Entered By: Regan Lemming on 01/21/2016 10:50:30 LAVONTE, LU ([id number]) -------------------------------------------------------------------------------- Wound Assessment Details Patient Name: [name] H. Date of Service: 01/21/2016 10:45 AM Medical Record Number: [id number] Patient Account  Number: [id number] Date of Birth/Sex: 14-Jan-1941 (75 y.o. Male) Treating RN: Baruch Gouty, RN, BSN, [place] Primary Care Physician: COX, KIRSTEN Other Clinician: Referring Physician: COX, KIRSTEN Treating Physician/Extender: Ricard Dillon Weeks in Treatment: 1 Wound Status Wound Number: 2 Primary Diabetic Wound/Ulcer of the Lower Etiology: Extremity Wound Location: Right Toe Great - Dorsal Wound Open Wounding Event: Gradually Appeared Status: Date Acquired: 12/10/2015 Comorbid Chronic Obstructive Pulmonary Disease Weeks Of Treatment: 1 History: (COPD), Arrhythmia, Hypertension, Clustered Wound: No Myocardial Infarction, Type II Diabetes, End Stage Renal Disease, Gout, Osteoarthritis, Neuropathy Photos Photo Uploaded By: Regan Lemming on 01/21/2016 17:15:13 Wound Measurements Length: (cm) 0.4 Width: (cm) 0.5 Depth: (cm) 0.1 Area: (cm) 0.157 Volume: (cm) 0.016 % Reduction in Area: 64.3% % Reduction in Volume: 63.6% Epithelialization: None Tunneling: No Undermining: No Wound Description Classification: Grade 1 Wound Margin: Distinct, outline attached Exudate Amount: Medium Exudate Type:  Serosanguineous Exudate Color: red, brown Foul Odor After Cleansing: No Wound Bed Granulation Amount: None Present (0%) Exposed Structure Necrotic Amount: Large (67-100%) Fascia Exposed: No Necrotic Quality: Adherent Slough Fat Layer Exposed: No Tendon Exposed: No Procell, Jadavion H. ([id number]) Muscle Exposed: No Joint Exposed: No Bone Exposed: No Limited to Skin Breakdown Periwound Skin Texture Texture Color No Abnormalities Noted: No No Abnormalities Noted: No Callus: No Atrophie Blanche: No Crepitus: No Cyanosis: No Excoriation: No Ecchymosis: No Fluctuance: No Erythema: No Friable: No Hemosiderin Staining: No Induration: No Mottled: No Localized Edema: No Pallor: No Rash: No Rubor: No Scarring: No Temperature / Pain Moisture Temperature: No Abnormality No  Abnormalities Noted: No Dry / Scaly: No Maceration: Yes Moist: Yes Wound Preparation Ulcer Cleansing: Rinsed/Irrigated with Saline Topical Anesthetic Applied: Other: lidocaine 4%, Treatment Notes Wound #2 (Right, Dorsal Toe Great) 1. Cleansed with: Clean wound with Normal Saline 4. Dressing Applied: Santyl Ointment 5. Secondary Dressing Applied Gauze and Kerlix/Conform 7. Secured with Recruitment consultant) Signed: 01/21/2016 5:26:45 PM By: Regan Lemming BSN, RN Entered By: Regan Lemming on 01/21/2016 10:50:39 NICKLAUS, MACKIE ([id number]) -------------------------------------------------------------------------------- Wound Assessment Details Patient Name: [name] H. Date of Service: 01/21/2016 10:45 AM Medical Record Number: [id number] Patient Account Number: [id number] Date of Birth/Sex: 1940-12-09 (75 y.o. Male) Treating RN: Baruch Gouty, RN, BSN, [place] Primary Care Physician: COX, KIRSTEN Other Clinician: Referring Physician: COX, KIRSTEN Treating Physician/Extender: Ricard Dillon Weeks in Treatment: 1 Wound Status Wound Number: 3 Primary Venous Leg Ulcer Etiology: Wound Location: Right Lower Leg - Anterior Wound Healed - Epithelialized Wounding Event: Gradually Appeared Status: Date Acquired: 12/10/2015 Comorbid Chronic Obstructive Pulmonary Disease Weeks Of Treatment: 1 History: (COPD), Arrhythmia, Hypertension, Clustered Wound: No Myocardial Infarction, Type II Diabetes, End Stage Renal Disease, Gout, Osteoarthritis, Neuropathy Photos Photo Uploaded By: Regan Lemming on 01/21/2016 17:16:10 Wound Measurements Length: (cm) 0 % Redu Width: (cm) 0 % Redu Depth: (cm) 0 Epithe Area: (cm) 0 [place] Volume: (cm) 0 Under ction in Area: 100% ction in Volume: 100% lialization: Large (67-100%) ling: No mining: No Wound Description Classification: Partial Thickness Diabetic Severity (Wagner): Grade 0 Wound Margin: Distinct, outline attach Exudate  Amount: Small Exudate Type: Serosanguineous Exudate Color: red, brown Foul Odor After Cleansing: No ed Wound Bed Granulation Amount: None Present (0%) Exposed Structure Necrotic Amount: None Present (0%) Fascia Exposed: No Fat Layer Exposed: No Soledad, Momen H. ([id number]) Tendon Exposed: No Muscle Exposed: No Joint Exposed: No Bone Exposed: No Limited to Skin Breakdown Periwound Skin Texture Texture Color No Abnormalities Noted: No No Abnormalities Noted: No Callus: No Atrophie Blanche: No Crepitus: No Cyanosis: No Excoriation: No Ecchymosis: No Fluctuance: No Erythema: No Friable: No Hemosiderin Staining: Yes Induration: No Mottled: Yes Localized Edema: No Pallor: No Rash: No Rubor: No Scarring: No Temperature / Pain Moisture Temperature: No Abnormality No Abnormalities Noted: No Dry / Scaly: Yes Maceration: No Moist: No Wound Preparation Ulcer Cleansing: Rinsed/Irrigated with Saline Topical Anesthetic Applied: None Electronic Signature(s) Signed: 01/21/2016 5:26:45 PM By: Regan Lemming BSN, RN Entered By: Regan Lemming on 01/21/2016 10:51:00 Mark [name] ([id number]) -------------------------------------------------------------------------------- Wound Assessment Details Patient Name: [name] H. Date of Service: 01/21/2016 10:45 AM Medical Record Number: [id number] Patient Account Number: [id number] Date of Birth/Sex: 1940/11/17 (75 y.o. Male) Treating RN: Baruch Gouty, RN, BSN, Velva Harman Primary Care Physician: COX, KIRSTEN Other Clinician: Referring Physician: COX, KIRSTEN Treating Physician/Extender: Ricard Dillon Weeks in Treatment: 1 Wound Status Wound Number: 4 Primary Pressure Ulcer Etiology: Wound Location: Sacrum Wound Open Wounding Event: Pressure  Injury Status: Date Acquired: 11/12/2015 Comorbid Chronic Obstructive Pulmonary Disease Weeks Of Treatment: 1 History: (COPD), Arrhythmia, Hypertension, Clustered Wound: No Myocardial  Infarction, Type II Diabetes, End Stage Renal Disease, Gout, Osteoarthritis, Neuropathy Photos Photo Uploaded By: Regan Lemming on 01/21/2016 17:16:11 Wound Measurements Length: (cm) 1.5 Width: (cm) 0.7 Depth: (cm) 0.1 Area: (cm) 0.825 Volume: (cm) 0.082 % Reduction in Area: 91.6% % Reduction in Volume: 91.6% Epithelialization: Small (1-33%) Tunneling: No Undermining: No Wound Description Classification: Category/Stage II Foul Odor Af Wound Margin: Distinct, outline attached Exudate Amount: Medium Exudate Type: Serosanguineous Exudate Color: red, brown ter Cleansing: No Wound Bed Granulation Amount: Large (67-100%) Exposed Structure Necrotic Amount: None Present (0%) Fascia Exposed: No Fat Layer Exposed: No Tendon Exposed: No Kreiser, Othmar H. ([id number]) Muscle Exposed: No Joint Exposed: No Bone Exposed: No Limited to Skin Breakdown Periwound Skin Texture Texture Color No Abnormalities Noted: No No Abnormalities Noted: No Callus: No Atrophie Blanche: No Crepitus: No Cyanosis: No Excoriation: No Ecchymosis: No Fluctuance: No Erythema: No Friable: No Hemosiderin Staining: No Induration: No Mottled: No Localized Edema: No Pallor: No Rash: No Rubor: No Scarring: No Moisture No Abnormalities Noted: No Dry / Scaly: No Maceration: No Moist: Yes Wound Preparation Ulcer Cleansing: Rinsed/Irrigated with Saline Topical Anesthetic Applied: Other: Lidocaine 4%, Treatment Notes Wound #4 (Sacrum) 1. Cleansed with: Clean wound with Normal Saline 3. Peri-wound Care: Skin Prep 4. Dressing Applied: Aquacel Ag 5. Secondary Dressing Applied Bordered Foam Dressing Electronic Signature(s) Signed: 01/21/2016 5:26:45 PM By: Regan Lemming BSN, RN Entered By: Regan Lemming on 01/21/2016 10:51:28 JOSTON, PATTON ([id number]) -------------------------------------------------------------------------------- Vitals Details Patient Name: Mark [name]. Date of  Service: 01/21/2016 10:45 AM Medical Record Number: [id number] Patient Account Number: [id number] Date of Birth/Sex: 1941-03-25 (75 y.o. Male) Treating RN: Baruch Gouty, RN, BSN, [place] Primary Care Physician: COX, KIRSTEN Other Clinician: Referring Physician: COX, KIRSTEN Treating Physician/Extender: Ricard Dillon Weeks in Treatment: 1 Vital Signs Time Taken: 10:50 Temperature (F): 97.8 Height (in): 67 Pulse (bpm): 100 Weight (lbs): 190 Respiratory Rate (breaths/min): 16 Body Mass Index (BMI): 29.8 Blood Pressure (mmHg): 130/77 Reference Range: 80 - 120 mg / dl Electronic Signature(s) Signed: 01/21/2016 5:26:45 PM By: Regan Lemming BSN, RN Entered By: Regan Lemming on 01/21/2016 10:51:55

## 2016-01-22 NOTE — Progress Notes (Signed)
TIRSO, STOCKS ([id number]) Visit Report for 01/21/2016 Chief Complaint Document Details Mark [name] Date of Service: 01/21/2016 10:45 AM Patient Name: H. Patient Account Number: [id number] Medical Record Treating RN: Mark Gouty, RN, BSN, Mark [name] [id number] Number: Other Clinician: Date of Birth/Sex: 10/23/1940 (75 y.o. Male) Treating Mark [name] Primary Care Physician: Mark [name] Physician/Extender: G Referring Physician: Edmonia [name] in Treatment: 1 Information Obtained from: Patient Chief Complaint Evaluation regarding patient's nonhealing left great toe amputation, right great toe ulcer, and sacral pressure ulcer Electronic Signature(s) Signed: 01/22/2016 8:01:59 AM By: Mark Ham MD Entered By: Mark [name] on 01/21/2016 12:46:32 Mark [name] ([id number]) -------------------------------------------------------------------------------- Debridement Details Mark [name] Date of Service: 01/21/2016 10:45 AM Patient Name: H. Patient Account Number: [id number] Medical Record Treating RN: Mark Gouty, RN, BSN, Mark [name] [id number] Number: Other Clinician: Date of Birth/Sex: 1940/10/15 (75 y.o. Male) Treating Mark [name], Arabi Primary Care Physician: Mark [name] Physician/Extender: G Referring Physician: Edmonia [name] in Treatment: 1 Debridement Performed for Wound #1 Left Toe Great Assessment: Performed By: Physician Mark Dillon, MD Debridement: Debridement Pre-procedure Yes - 11:15 Verification/Time Out Taken: Start Time: 11:15 Pain Control: Lidocaine 4% Topical Solution Level: Skin/Subcutaneous Tissue Total Area Debrided (L x 1.5 (cm) x 1.8 (cm) = 2.7 (cm) W): Tissue and other Non-Viable, Fibrin/Slough, Subcutaneous material debrided: Instrument: Blade, Forceps Bleeding: Minimum Hemostasis Achieved: Pressure End Time: 11:17 Procedural Pain: 0 Post Procedural Pain: 1 Response to Treatment: Procedure was tolerated well Post Debridement  Measurements of Total Wound Length: (cm) 1.5 Width: (cm) 1.8 Depth: (cm) 0.2 Volume: (cm) 0.424 Character of Wound/Ulcer Post Requires Further Debridement Debridement: Severity of Tissue Post Debridement: Fat layer exposed Post Procedure Diagnosis Same as Pre-procedure Electronic Signature(s) Signed: 01/21/2016 5:26:45 PM By: Mark [name] BSN, RN Signed: 01/22/2016 8:01:59 AM By: Mark Ham MD Mark [name], Mark [name] ([id number]) Entered By: Mark [name] on 01/21/2016 12:45:51 Mark [name], Mark [name] ([id number]) -------------------------------------------------------------------------------- Debridement Details Mark [name] Date of Service: 01/21/2016 10:45 AM Patient Name: H. Patient Account Number: [id number] Medical Record Treating RN: Mark Gouty, RN, BSN, Mark [name] [id number] Number: Other Clinician: Date of Birth/Sex: 24-Dec-1940 (75 y.o. Male) Treating Mark [name], Mark [name] Primary Care Physician: Mark [name] Physician/Extender: G Referring Physician: [facility], [name] Weeks in Treatment: 1 Debridement Performed for Wound #2 Right,Dorsal Toe Great Assessment: Performed By: Physician Mark Dillon, MD Debridement: Debridement Pre-procedure Yes - 11:15 Verification/Time Out Taken: Start Time: 11:15 Pain Control: Lidocaine 4% Topical Solution Level: Skin/Subcutaneous Tissue Total Area Debrided (L x 0.4 (cm) x 0.5 (cm) = 0.2 (cm) W): Tissue and other Non-Viable, Fibrin/Slough, Subcutaneous material debrided: Instrument: Curette Bleeding: Minimum Hemostasis Achieved: Pressure End Time: 11:20 Procedural Pain: 0 Post Procedural Pain: 0 Response to Treatment: Procedure was tolerated well Post Debridement Measurements of Total Wound Length: (cm) 0.4 Width: (cm) 0.5 Depth: (cm) 0.1 Volume: (cm) 0.016 Character of Wound/Ulcer Post Requires Further Debridement Debridement: Severity of Tissue Post Debridement: Fat layer exposed Post Procedure Diagnosis Same as  Pre-procedure Electronic Signature(s) Signed: 01/21/2016 5:26:45 PM By: Mark [name] BSN, RN Signed: 01/22/2016 8:01:59 AM By: Mark Ham MD Mark [name], Mark [name] ([id number]) Entered By: Mark [name] on 01/21/2016 12:46:06 Mark [name], Mark [name] ([id number]) -------------------------------------------------------------------------------- HPI Details Mark [name] Date of Service: 01/21/2016 10:45 AM Patient Name: H. Patient Account Number: [id number] Medical Record Treating RN: Mark Gouty RN, BSN, Mark [name] [id number] Number: Other Clinician: Date of Birth/Sex: July 19, 1940 (75 y.o. Male) Treating Mark [name] Primary Care Physician: Mark [name] Physician/Extender: G Referring Physician: [facility], [name] Weeks in Treatment: 1 History of Present Illness Location: Sacrum, dorsal right great toe, and  left great toe amputation site. Quality: Patient describes the pain as a soreness and sometimes burning sensation Severity: 4 out of 5 Duration: developed in the last 4 months due to patient having to sit through dialysis secondary to his kidney failing Timing: The patient describes the pain as being fairly persistent mainly in the sacral region although this is worse is if he is laying on it. Context: As a result of pressure in regard to the sacral ulcer. The lower extremity wounds of the toe her diabetic in nature though the left great toe amputation site is obviously a surgical wound. Modifying Factors: It appears that in the facility Santyl has been used on patient's wounds currently. Associated Signs and Symptoms: Patient has left great toe amputation, diabetes mellitus type 2, status post kidney transplant with subsequent kidney failure due to dye used for imaging studies, chronic atrial fibrillation, congestive heart failure HPI Description: 01/14/16 patient presents for initial evaluation concerning ulcers that he has over his sacral region which have developed more recently a result of  him having to go back on dialysis secondary to his transplanted kidney failing which apparently was due to dye given for an imaging study. With that being said he tells me that he tries as much as possible to offload one way or another during the time that he is having to sit for dialysis. However nonetheless he has developed the pressure ulcer to the sacral region. Subsequently he also has a diabetic foot ulcer to the dorsal surface of his right great toe to which Santyl has been used topically at this point in time. He also has a still open amputation site regard to his left great toe with necrotic tissue present. this amputation was performed on November 06, 2015 Dr. Oneida Alar. Due to osteomyelitis of the left first toe.This still has not achieved a good granulation bed. 01/21/16 patient was admitted to the clinic last week by Jeri Cos PA. He has a left first toe amputation site wound just distal to his MTP. A wound over the interphalangeal joint of his right toe and a pressure ulcer on his right buttock. During the recent hospitalization tells me he went into acute renal failure on a transplanted kidney and is now back on dialysis. He is leaving a nursing home today and going home to his wife. I don't see any specific vascular information at this point I'll need to research [facility] on this. He apparently had osteomyelitis of the left first toe which was the reason for the amputation. The patient is a type II diabetic Electronic Signature(s) Signed: 01/22/2016 8:01:59 AM By: Mark Ham MD Entered By: Mark [name] on 01/21/2016 12:49:11 Mark [name], Mark [name] ([id number]) -------------------------------------------------------------------------------- Physical Exam Details Mark [name] Date of Service: 01/21/2016 10:45 AM Patient Name: H. Patient Account Number: [id number] Medical Record Treating RN: Mark Gouty RN, BSN, Mark [name] [id number] Number: Other Clinician: Date of Birth/Sex:  November 25, 1940 (75 y.o. Male) Treating Mark [name] Primary Care Physician: Mark [name] Physician/Extender: G Referring Physician: [facility], [name] Weeks in Treatment: 1 Constitutional Sitting or standing Blood Pressure is within target range for patient.. Pulse regular and within target range for patient.Marland Kitchen Respirations regular, non-labored and within target range.. Temperature is normal and within the target range for the patient.Marland Kitchen Appears to be a frail elderly man but not in any distress. Cardiovascular Pedal pulses absent bilaterally.. Notes Wound exam oHis left great toe amputation site as 50% of the surface granulated the rest is covered by gelatinous material which was debrided. I'm  not sure if there is going to be a viable surface here. Furthermore I think this probably probes more deeply although I did not aggressively disturb this. There was no overt infection oOver the IP joint on the right great toe there is a small wound with adherent eschar which is debrided. Surrounding erythema noted without tenderness I wonder if this could be Santyl irritation not cellulitis oOver his right buttock a small superficial healthy-looking pressure area with rims of epithelialization Electronic Signature(s) Signed: 01/22/2016 8:01:59 AM By: Mark Ham MD Entered By: Mark [name] on 01/21/2016 12:51:35 Mark [name], Mark [name] ([id number]) -------------------------------------------------------------------------------- Physician Orders Details Mark [name] Date of Service: 01/21/2016 10:45 AM Patient Name: H. Patient Account Number: [id number] Medical Record Treating RN: Mark Gouty RN, BSN, Mark [name] [id number] Number: Other Clinician: Date of Birth/Sex: 1940-10-20 (75 y.o. Male) Treating Mark [name] Primary Care Physician: Mark [name] Physician/Extender: G Referring Physician: Edmonia [name] in Treatment: 1 Verbal / Phone Orders: Yes Clinician: Afful, RN, BSN, Rita Read Back and  Verified: Yes Diagnosis Coding Wound Cleansing Wound #1 Left Toe Great o Clean wound with Normal Saline. o May shower with protection. Wound #2 Right,Dorsal Toe Great o Clean wound with Normal Saline. o May shower with protection. Wound #4 Sacrum o Clean wound with Normal Saline. o May shower with protection. Anesthetic Wound #1 Left Toe Great o Topical Lidocaine 4% cream applied to wound bed prior to debridement Wound #2 Right,Dorsal Toe Great o Topical Lidocaine 4% cream applied to wound bed prior to debridement Wound #4 Sacrum o Topical Lidocaine 4% cream applied to wound bed prior to debridement Skin Barriers/Peri-Wound Care Wound #1 Left Toe Great o Barrier cream Wound #2 Right,Dorsal Toe Great o Barrier cream Wound #4 Sacrum o Barrier cream Primary Wound Dressing Wound #1 Left Toe MIHAILO, ALBRECHT H. ([id number]) o Santyl Ointment Wound #2 Right,Dorsal Toe Great o Santyl Ointment Wound #4 Sacrum o Sorbalgon Ag Secondary Dressing Wound #1 Left Toe Great o Gauze and Kerlix/Conform Wound #2 Right,Dorsal Toe Great o Gauze and Kerlix/Conform Wound #4 Sacrum o Boardered Foam Dressing Dressing Change Frequency Wound #1 Left Toe Great o Change dressing every day. Wound #2 Right,Dorsal Toe Great o Change dressing every day. Wound #4 Sacrum o Change dressing every other day. Follow-up Appointments Wound #1 Left Toe Great o Return Appointment in 1 week. Wound #2 Right,Dorsal Toe Great o Return Appointment in 1 week. Wound #4 Sacrum o Return Appointment in 1 week. Off-Loading o Turn and reposition every 2 hours o Mattress - Per Patient he is on an air mattress Additional Orders / Instructions Wound #1 Left Toe Great o Increase protein intake. o Activity as tolerated o Other: - [employer], Vitamin A, C, MVI Fiorillo, Xyon H. ([id number]) Wound #2 Right,Dorsal Toe Great o Increase protein intake. o  Activity as tolerated o Other: - [employer], Vitamin A, C, MVI Wound #4 Sacrum o Increase protein intake. o Activity as tolerated o Other: - [employer], Vitamin A, C, MVI Electronic Signature(s) Signed: 01/21/2016 5:26:45 PM By: Mark [name] BSN, RN Signed: 01/22/2016 8:01:59 AM By: Mark Ham MD Entered By: Mark [name] on 01/21/2016 11:18:00 Mark [name], Mark [name] ([id number]) -------------------------------------------------------------------------------- Problem List Details Mark [name] Date of Service: 01/21/2016 10:45 AM Patient Name: H. Patient Account Number: [id number] Medical Record Treating RN: Mark Gouty RN, BSN, Mark [name] [id number] Number: Other Clinician: Date of Birth/Sex: July 21, 1940 (75 y.o. 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, sequela 01/15/2016 Yes Z89.412 Acquired absence of left great toe 01/15/2016 Yes L97.512 Non-pressure chronic ulcer of other part of right foot with 01/15/2016 Yes fat layer exposed L97.522 Non-pressure chronic ulcer of other part of left foot with fat 01/15/2016 Yes layer exposed Inactive Problems Resolved Problems Electronic Signature(s) Mark [name], Mark [name] ([id number]) Signed: 01/22/2016 8:01:59 AM By: Mark Ham MD Entered By: Mark [name] on 01/21/2016 12:45:32 Mark [name], Mark [name] ([id number]) -------------------------------------------------------------------------------- Progress Note Details Mark [name] Date of Service: 01/21/2016 10:45  AM Patient Name: H. Patient Account Number: [id number] Medical Record Treating RN: Mark Gouty, RN, BSN, Mark [name] [id number] Number: Other Clinician: Date of Birth/Sex: 05/27/1940 (75 y.o. Male) Treating Mark [name] Primary Care Physician: Mark [name] Physician/Extender: G Referring Physician: Edmonia [name] in Treatment: 1 Subjective Chief Complaint Information obtained from Patient Evaluation regarding patient's nonhealing left great toe amputation, right great toe ulcer, and sacral pressure ulcer History of Present Illness (HPI) The following HPI elements were documented for the patient's wound: Location: Sacrum, dorsal right great toe, and left great toe amputation site. Quality: Patient describes the pain as a soreness and sometimes burning sensation Severity: 4 out of 5 Duration: developed in the last 4 months due to patient having to sit through dialysis secondary to his kidney failing Timing: The patient describes the pain as being fairly persistent mainly in the sacral region although this is worse is if he is laying on it. Context: As a result of pressure in regard to the sacral ulcer. The lower extremity wounds of the toe her diabetic in nature though the left great toe amputation site is obviously a surgical wound. Modifying Factors: It appears that in the facility Santyl has been used on patient's wounds currently. Associated Signs and Symptoms: Patient has left great toe amputation, diabetes mellitus type 2, status post kidney transplant with subsequent kidney failure due to dye used for imaging studies, chronic atrial fibrillation, congestive heart failure 01/14/16 patient presents for initial evaluation concerning ulcers that he has over his sacral region which have developed more recently a result of him having to go back on dialysis secondary to his transplanted kidney failing which apparently was due to dye given for an imaging study. With that being said he tells  me that he tries as much as possible to offload one way or another during the time that he is having to sit for dialysis. However nonetheless he has developed the pressure ulcer to the sacral region. Subsequently he also has a diabetic foot ulcer to the dorsal surface of his right great toe to which Santyl has been used topically at this point in time. He also has a still open amputation site regard to his left great toe with necrotic tissue present. this amputation was performed on November 06, 2015 Dr. Oneida Alar. Due to osteomyelitis of the left first toe.This still has not achieved a good granulation bed. 01/21/16 patient was admitted to the clinic last week by Jeri Cos PA. He has a left first toe amputation site wound just distal to his MTP. A wound over the interphalangeal joint of his right toe and a pressure ulcer on his right buttock. During the recent hospitalization tells me he went into acute renal failure on a  transplanted kidney and is now back on dialysis. He is leaving a nursing home today and going home to his wife. I don't see any specific vascular information at this point I'll need to research [facility] on this. He apparently had osteomyelitis of the left first toe which was the reason for the amputation. The patient is Mark [name], Mark [name] ([id number]) a type II diabetic Objective Constitutional Sitting or standing Blood Pressure is within target range for patient.. Pulse regular and within target range for patient.Marland Kitchen Respirations regular, non-labored and within target range.. Temperature is normal and within the target range for the patient.Marland Kitchen Appears to be a frail elderly man but not in any distress. Vitals Time Taken: 10:50 AM, Height: 67 in, Weight: 190 lbs, BMI: 29.8, Temperature: 97.8 F, Pulse: 100 bpm, Respiratory Rate: 16 breaths/min, Blood Pressure: 130/77 mmHg. Cardiovascular Pedal pulses absent bilaterally.. General Notes: Wound exam His left great toe  amputation site as 50% of the surface granulated the rest is covered by gelatinous material which was debrided. I'm not sure if there is going to be a viable surface here. Furthermore I think this probably probes more deeply although I did not aggressively disturb this. There was no overt infection Over the IP joint on the right great toe there is a small wound with adherent eschar which is debrided. Surrounding erythema noted without tenderness I wonder if this could be Santyl irritation not cellulitis Over his right buttock a small superficial healthy-looking pressure area with rims of epithelialization Integumentary (Hair, Skin) Wound #1 status is Open. Original cause of wound was Surgical Injury. The wound is located on the Left Toe Great. The wound measures 1.5cm length x 1.8cm width x 0.2cm depth; 2.121cm^2 area and 0.424cm^3 volume. The wound is limited to skin breakdown. There is no tunneling or undermining noted. There is a large amount of serosanguineous drainage noted. The wound margin is distinct with the outline attached to the wound base. There is small (1-33%) granulation within the wound bed. There is a large (67- 100%) amount of necrotic tissue within the wound bed including Adherent Slough. The periwound skin appearance exhibited: Moist. The periwound skin appearance did not exhibit: Callus, Crepitus, Excoriation, Fluctuance, Friable, Induration, Localized Edema, Rash, Scarring, Dry/Scaly, Maceration, Atrophie Blanche, Cyanosis, Ecchymosis, Hemosiderin Staining, Mottled, Pallor, Rubor, Erythema. Periwound temperature was noted as No Abnormality. Wound #2 status is Open. Original cause of wound was Gradually Appeared. The wound is located on the Right,Dorsal [employer] The wound measures 0.4cm length x 0.5cm width x 0.1cm depth; 0.157cm^2 area and 0.016cm^3 volume. The wound is limited to skin breakdown. There is no tunneling or undermining noted. There is a medium amount of  serosanguineous drainage noted. The wound margin is distinct with the outline attached to the wound base. There is no granulation within the wound bed. There is a large (67- 100%) amount of necrotic tissue within the wound bed including Adherent Slough. The periwound skin appearance exhibited: Maceration, Moist. The periwound skin appearance did not exhibit: Callus, Crepitus, Excoriation, Fluctuance, Friable, Induration, Localized Edema, Rash, Scarring, Dry/Scaly, Atrophie Maritza, Sliker, Curtez H. ([id number]) Cyanosis, Ecchymosis, Hemosiderin Staining, Mottled, Pallor, Rubor, Erythema. Periwound temperature was noted as No Abnormality. Wound #3 status is Healed - Epithelialized. Original cause of wound was Gradually Appeared. The wound is located on the Right,Anterior Lower Leg. The wound measures 0cm length x 0cm width x 0cm depth; 0cm^2 area and 0cm^3 volume. The wound is limited to skin breakdown. There is no tunneling or undermining noted.  There is a small amount of serosanguineous drainage noted. The wound margin is distinct with the outline attached to the wound base. There is no granulation within the wound bed. There is no necrotic tissue within the wound bed. The periwound skin appearance exhibited: Dry/Scaly, Hemosiderin Staining, Mottled. The periwound skin appearance did not exhibit: Callus, Crepitus, Excoriation, Fluctuance, Friable, Induration, Localized Edema, Rash, Scarring, Maceration, Moist, Atrophie Blanche, Cyanosis, Ecchymosis, Pallor, Rubor, Erythema. Periwound temperature was noted as No Abnormality. Wound #4 status is Open. Original cause of wound was Pressure Injury. The wound is located on the Sacrum. The wound measures 1.5cm length x 0.7cm width x 0.1cm depth; 0.825cm^2 area and 0.082cm^3 volume. The wound is limited to skin breakdown. There is no tunneling or undermining noted. There is a medium amount of serosanguineous drainage noted. The wound margin is distinct  with the outline attached to the wound base. There is large (67-100%) granulation within the wound bed. There is no necrotic tissue within the wound bed. The periwound skin appearance exhibited: Moist. The periwound skin appearance did not exhibit: Callus, Crepitus, Excoriation, Fluctuance, Friable, Induration, Localized Edema, Rash, Scarring, Dry/Scaly, Maceration, Atrophie Blanche, Cyanosis, Ecchymosis, Hemosiderin Staining, Mottled, Pallor, Rubor, Erythema. 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 There was a Skin/Subcutaneous Tissue Debridement [id number] debridement with total area of 2.7 sq cm performed Awad, Alekai H. ([id number]) by Mark Dillon, MD. with the following instrument(s): Blade and Forceps to remove Non-Viable tissue/material including Fibrin/Slough and Subcutaneous after achieving pain control using Lidocaine 4% Topical Solution. A time out was conducted at 11:15, prior to the start of the procedure. A Minimum amount of bleeding was controlled with Pressure. The procedure was tolerated well with a pain level of 0 throughout and a pain level of 1 following the procedure. Post Debridement Measurements: 1.5cm length x 1.8cm width x 0.2cm depth; 0.424cm^3 volume. Character of Wound/Ulcer Post Debridement requires further debridement. Severity of Tissue Post Debridement is: Fat  layer exposed. Post procedure Diagnosis Wound #1: Same as Pre-Procedure Wound #2 Wound #2 is a Diabetic Wound/Ulcer of the Lower Extremity located on the Right,Dorsal Toe Great . There was a Skin/Subcutaneous Tissue Debridement [id number] debridement with total area of 0.2 sq cm performed by Mark Dillon, MD. with the following instrument(s): Curette to remove Non-Viable tissue/material including Fibrin/Slough and Subcutaneous after achieving pain control using Lidocaine 4% Topical Solution. A time out was conducted at 11:15, prior to the start of the procedure. A Minimum amount of bleeding was controlled with Pressure. The procedure was tolerated well with a pain level of 0 throughout and a pain level of 0 following the procedure. Post Debridement Measurements: 0.4cm length x 0.5cm width x 0.1cm depth; 0.016cm^3 volume. Character of Wound/Ulcer Post Debridement requires further debridement. Severity of Tissue Post Debridement is: Fat layer exposed. Post procedure Diagnosis Wound #2: Same as Pre-Procedure Plan Wound Cleansing: Wound #1 Left Toe Great: Clean wound with Normal Saline. May  shower with protection. Wound #2 Right,Dorsal Toe Great: Clean wound with Normal Saline. May shower with protection. Wound #4 Sacrum: Clean wound with Normal Saline. May shower with protection. Anesthetic: Wound #1 Left Toe Great: Topical Lidocaine 4% cream applied to wound bed prior to debridement Wound #2 Right,Dorsal Toe Great: Topical Lidocaine 4% cream applied to wound bed prior to debridement Wound #4 Sacrum: Topical Lidocaine 4% cream applied to wound bed prior to debridement Skin Barriers/Peri-Wound Care: Wound #1 Left Toe GreatLUISCARLOS, Mark [name] ([id number]) Barrier cream Wound #2 Right,Dorsal Toe Great: Barrier cream Wound #4 Sacrum: Barrier cream Primary Wound Dressing: Wound #1 Left Toe Great: Santyl Ointment Wound #2 Right,Dorsal Toe Great: Santyl Ointment Wound #4  Sacrum: Sorbalgon Ag Secondary Dressing: Wound #1 Left Toe Great: Gauze and Kerlix/Conform Wound #2 Right,Dorsal Toe Great: Gauze and Kerlix/Conform Wound #4 Sacrum: Boardered Foam Dressing Dressing Change Frequency: Wound #1 Left Toe Great: Change dressing every day. Wound #2 Right,Dorsal Toe Great: Change dressing every day. Wound #4 Sacrum: Change dressing every other day. Follow-up Appointments: Wound #1 Left Toe Great: Return Appointment in 1 week. Wound #2 Right,Dorsal Toe Great: Return Appointment in 1 week. Wound #4 Sacrum: Return Appointment in 1 week. Off-Loading: Turn and reposition every 2 hours Mattress - Per Patient he is on an air mattress Additional Orders / Instructions: Wound #1 Left Toe Great: Increase protein intake. Activity as tolerated Other: - ZInc, Vitamin A, C, MVI Wound #2 Right,Dorsal Toe Great: Increase protein intake. Activity as tolerated Other: - ZInc, Vitamin A, C, MVI Wound #4 Sacrum: Increase protein intake. Activity as tolerated Other: - ZInc, Vitamin A, C, MVI Edmondson, Brannen H. ([id number]) o -I continued this Santyl to the amputation site on the left as well as a small wound dorsally over the IP joint on the right I am concerned about the amputation sites viability medially Silver alginate to the right buttock wound with the border foam seems very reasonable Patient is leaving the nursing home with exceedingly complex medical requirements I'm hopeful his family is up to this -Will need to review any vascular information Electronic Signature(s) Signed: 01/22/2016 8:01:59 AM By: Mark Ham MD Entered By: Mark [name] on 01/21/2016 12:54:03 AUNDRAY, BATZ ([id number]) -------------------------------------------------------------------------------- SuperBill Details Mark [name] Date of Service: 01/21/2016 Patient Name: H. Patient Account Number: [id number] Medical Record Treating RN: Mark Gouty RN, BSN,  Mark [name] [id number] Number: Other Clinician: Date of Birth/Sex: 1940/08/20 (75 y.o. 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

## 2016-01-22 NOTE — Discharge Instructions (Signed)
Follow-up with dialysis tomorrow as planned ?

## 2016-01-22 NOTE — ED Triage Notes (Signed)
Pt presents to ed with complaints of generalized weakness which began a few days ago. Pt has been a dialysis pt since beginning of July 2017. Currently has no other complaints. Pt is to receive dialysis in the morning.

## 2016-01-23 DIAGNOSIS — N170 Acute kidney failure with tubular necrosis: Secondary | ICD-10-CM | POA: Diagnosis not present

## 2016-01-23 DIAGNOSIS — D509 Iron deficiency anemia, unspecified: Secondary | ICD-10-CM | POA: Diagnosis not present

## 2016-01-23 DIAGNOSIS — N2581 Secondary hyperparathyroidism of renal origin: Secondary | ICD-10-CM | POA: Diagnosis not present

## 2016-01-25 DIAGNOSIS — N170 Acute kidney failure with tubular necrosis: Secondary | ICD-10-CM | POA: Diagnosis not present

## 2016-01-25 DIAGNOSIS — D509 Iron deficiency anemia, unspecified: Secondary | ICD-10-CM | POA: Diagnosis not present

## 2016-01-25 DIAGNOSIS — N2581 Secondary hyperparathyroidism of renal origin: Secondary | ICD-10-CM | POA: Diagnosis not present

## 2016-01-26 DIAGNOSIS — L97522 Non-pressure chronic ulcer of other part of left foot with fat layer exposed: Secondary | ICD-10-CM | POA: Diagnosis not present

## 2016-01-26 DIAGNOSIS — E11621 Type 2 diabetes mellitus with foot ulcer: Secondary | ICD-10-CM | POA: Diagnosis not present

## 2016-01-26 DIAGNOSIS — L89312 Pressure ulcer of right buttock, stage 2: Secondary | ICD-10-CM | POA: Diagnosis not present

## 2016-01-26 DIAGNOSIS — I482 Chronic atrial fibrillation: Secondary | ICD-10-CM | POA: Diagnosis not present

## 2016-01-26 DIAGNOSIS — I132 Hypertensive heart and chronic kidney disease with heart failure and with stage 5 chronic kidney disease, or end stage renal disease: Secondary | ICD-10-CM | POA: Diagnosis not present

## 2016-01-26 DIAGNOSIS — E1122 Type 2 diabetes mellitus with diabetic chronic kidney disease: Secondary | ICD-10-CM | POA: Diagnosis not present

## 2016-01-27 ENCOUNTER — Telehealth: Payer: Self-pay | Admitting: Endocrinology

## 2016-01-27 DIAGNOSIS — L97522 Non-pressure chronic ulcer of other part of left foot with fat layer exposed: Secondary | ICD-10-CM | POA: Diagnosis not present

## 2016-01-27 DIAGNOSIS — E1122 Type 2 diabetes mellitus with diabetic chronic kidney disease: Secondary | ICD-10-CM | POA: Diagnosis not present

## 2016-01-27 DIAGNOSIS — I132 Hypertensive heart and chronic kidney disease with heart failure and with stage 5 chronic kidney disease, or end stage renal disease: Secondary | ICD-10-CM | POA: Diagnosis not present

## 2016-01-27 DIAGNOSIS — I482 Chronic atrial fibrillation: Secondary | ICD-10-CM | POA: Diagnosis not present

## 2016-01-27 DIAGNOSIS — L89312 Pressure ulcer of right buttock, stage 2: Secondary | ICD-10-CM | POA: Diagnosis not present

## 2016-01-27 DIAGNOSIS — E11621 Type 2 diabetes mellitus with foot ulcer: Secondary | ICD-10-CM | POA: Diagnosis not present

## 2016-01-27 NOTE — Telephone Encounter (Signed)
Pt called in and requested a call back from [facility].  He stated that he ever since he got out of the hospital he feels like he can't use his pump in the same way as before and was concerned and wanted to speak with Suanne Marker.

## 2016-01-28 DIAGNOSIS — D509 Iron deficiency anemia, unspecified: Secondary | ICD-10-CM | POA: Diagnosis not present

## 2016-01-28 DIAGNOSIS — N2581 Secondary hyperparathyroidism of renal origin: Secondary | ICD-10-CM | POA: Diagnosis not present

## 2016-01-28 DIAGNOSIS — N170 Acute kidney failure with tubular necrosis: Secondary | ICD-10-CM | POA: Diagnosis not present

## 2016-01-29 ENCOUNTER — Encounter: Payer: No Typology Code available for payment source | Admitting: Internal Medicine

## 2016-01-29 DIAGNOSIS — L89312 Pressure ulcer of right buttock, stage 2: Secondary | ICD-10-CM | POA: Diagnosis not present

## 2016-01-29 DIAGNOSIS — E1122 Type 2 diabetes mellitus with diabetic chronic kidney disease: Secondary | ICD-10-CM | POA: Diagnosis not present

## 2016-01-29 DIAGNOSIS — L97521 Non-pressure chronic ulcer of other part of left foot limited to breakdown of skin: Secondary | ICD-10-CM | POA: Diagnosis not present

## 2016-01-29 DIAGNOSIS — I132 Hypertensive heart and chronic kidney disease with heart failure and with stage 5 chronic kidney disease, or end stage renal disease: Secondary | ICD-10-CM | POA: Diagnosis not present

## 2016-01-29 DIAGNOSIS — S98112S Complete traumatic amputation of left great toe, sequela: Secondary | ICD-10-CM | POA: Diagnosis not present

## 2016-01-29 DIAGNOSIS — I5033 Acute on chronic diastolic (congestive) heart failure: Secondary | ICD-10-CM | POA: Diagnosis not present

## 2016-01-29 DIAGNOSIS — Z89412 Acquired absence of left great toe: Secondary | ICD-10-CM | POA: Diagnosis not present

## 2016-01-29 DIAGNOSIS — L97522 Non-pressure chronic ulcer of other part of left foot with fat layer exposed: Secondary | ICD-10-CM | POA: Diagnosis not present

## 2016-01-29 DIAGNOSIS — E11621 Type 2 diabetes mellitus with foot ulcer: Secondary | ICD-10-CM | POA: Diagnosis not present

## 2016-01-29 DIAGNOSIS — L89153 Pressure ulcer of sacral region, stage 3: Secondary | ICD-10-CM | POA: Diagnosis not present

## 2016-01-29 DIAGNOSIS — I482 Chronic atrial fibrillation: Secondary | ICD-10-CM | POA: Diagnosis not present

## 2016-01-29 DIAGNOSIS — L97511 Non-pressure chronic ulcer of other part of right foot limited to breakdown of skin: Secondary | ICD-10-CM | POA: Diagnosis not present

## 2016-01-30 ENCOUNTER — Other Ambulatory Visit
Admission: RE | Admit: 2016-01-30 | Discharge: 2016-01-30 | Disposition: A | Discharge status: Home / Self Care | Payer: Medicare Other | Source: Ambulatory Visit | Attending: Internal Medicine | Admitting: Internal Medicine

## 2016-01-30 DIAGNOSIS — N2581 Secondary hyperparathyroidism of renal origin: Secondary | ICD-10-CM | POA: Diagnosis not present

## 2016-01-30 DIAGNOSIS — N170 Acute kidney failure with tubular necrosis: Secondary | ICD-10-CM | POA: Diagnosis not present

## 2016-01-30 DIAGNOSIS — E1122 Type 2 diabetes mellitus with diabetic chronic kidney disease: Secondary | ICD-10-CM | POA: Diagnosis not present

## 2016-01-30 DIAGNOSIS — L89312 Pressure ulcer of right buttock, stage 2: Secondary | ICD-10-CM | POA: Diagnosis not present

## 2016-01-30 DIAGNOSIS — X58XXXD Exposure to other specified factors, subsequent encounter: Secondary | ICD-10-CM | POA: Insufficient documentation

## 2016-01-30 DIAGNOSIS — S91102D Unspecified open wound of left great toe without damage to nail, subsequent encounter: Secondary | ICD-10-CM | POA: Insufficient documentation

## 2016-01-30 DIAGNOSIS — I482 Chronic atrial fibrillation: Secondary | ICD-10-CM | POA: Diagnosis not present

## 2016-01-30 DIAGNOSIS — N185 Chronic kidney disease, stage 5: Secondary | ICD-10-CM | POA: Diagnosis not present

## 2016-01-30 DIAGNOSIS — E11621 Type 2 diabetes mellitus with foot ulcer: Secondary | ICD-10-CM | POA: Diagnosis not present

## 2016-01-30 DIAGNOSIS — I5033 Acute on chronic diastolic (congestive) heart failure: Secondary | ICD-10-CM | POA: Diagnosis not present

## 2016-01-30 DIAGNOSIS — I132 Hypertensive heart and chronic kidney disease with heart failure and with stage 5 chronic kidney disease, or end stage renal disease: Secondary | ICD-10-CM | POA: Diagnosis not present

## 2016-01-30 DIAGNOSIS — L97522 Non-pressure chronic ulcer of other part of left foot with fat layer exposed: Secondary | ICD-10-CM | POA: Diagnosis not present

## 2016-01-30 DIAGNOSIS — D509 Iron deficiency anemia, unspecified: Secondary | ICD-10-CM | POA: Diagnosis not present

## 2016-01-30 DIAGNOSIS — E1129 Type 2 diabetes mellitus with other diabetic kidney complication: Secondary | ICD-10-CM | POA: Diagnosis not present

## 2016-01-30 NOTE — Progress Notes (Signed)
Mark [name], Mark [name] ([id number]) Visit Report for 01/29/2016 Chief Complaint Document Details JB, KODA Date of Service: 01/29/2016 10:45 AM Patient Name: H. Patient Account Number: [id number] Medical Record Treating RN: Baruch Gouty, RN, BSN, Velva Harman [id number] Number: Other Clinician: Date of Birth/Sex: 02-27-1941 (75 y.o. Male) Treating Linton Ham Primary Care Physician: COX, KIRSTEN Physician/Extender: G Referring Physician: Edmonia Caprio in Treatment: 2 Information Obtained from: Patient Chief Complaint Evaluation regarding patient's nonhealing left great toe amputation, right great toe ulcer, and sacral pressure ulcer Electronic Signature(s) Signed: 01/29/2016 6:13:30 PM By: Linton Ham MD Entered By: Linton Ham on 01/29/2016 13:57:29 Mark [name] ([id number]) -------------------------------------------------------------------------------- Debridement Details Mark [name] Date of Service: 01/29/2016 10:45 AM Patient Name: H. Patient Account Number: [id number] Medical Record Treating RN: Baruch Gouty RN, BSN, Velva Harman [id number] Number: Other Clinician: Date of Birth/Sex: 29-Jun-1940 (75 y.o. Male) Treating ROBSON, [place] Primary Care Physician: COX, KIRSTEN Physician/Extender: G Referring Physician: Edmonia Caprio in Treatment: 2 Debridement Performed for Wound #1 Left Toe Great Assessment: Performed By: Physician Ricard Dillon, MD Debridement: Debridement Pre-procedure Yes - 11:28 Verification/Time Out Taken: Start Time: 11:30 Pain Control: Lidocaine 4% Topical Solution Level: Skin/Subcutaneous Tissue Total Area Debrided (L x 1 (cm) x 1.5 (cm) = 1.5 (cm) W): Tissue and other Non-Viable, Eschar, Exudate, Fibrin/Slough, Subcutaneous material debrided: Instrument: Curette Bleeding: Minimum Hemostasis Achieved: Pressure End Time: 11:34 Procedural Pain: 0 Post Procedural Pain: 0 Response to Treatment: Procedure was tolerated well Post  Debridement Measurements of Total Wound Length: (cm) 1 Width: (cm) 1.5 Depth: (cm) 0.2 Volume: (cm) 0.236 Character of Wound/Ulcer Post Requires Further Debridement Debridement: Severity of Tissue Post Debridement: Fat layer exposed Post Procedure Diagnosis Same as Pre-procedure Electronic Signature(s) Signed: 01/29/2016 5:35:18 PM By: Regan Lemming BSN, RN Signed: 01/29/2016 6:13:30 PM By: Linton Ham MD Mark [name], Mark [name] ([id number]) Entered By: Linton Ham on 01/29/2016 13:56:51 Mark [name], Mark [name] ([id number]) -------------------------------------------------------------------------------- Debridement Details Mark [name] Date of Service: 01/29/2016 10:45 AM Patient Name: H. Patient Account Number: [id number] Medical Record Treating RN: Baruch Gouty, RN, BSN, Velva Harman [id number] Number: Other Clinician: Date of Birth/Sex: Dec 12, 1940 (75 y.o. Male) Treating ROBSON, [place] Primary Care Physician: COX, KIRSTEN Physician/Extender: G Referring Physician: Edmonia Caprio in Treatment: 2 Debridement Performed for Wound #2 Right,Dorsal Toe Great Assessment: Performed By: Physician Ricard Dillon, MD Debridement: Debridement Pre-procedure Yes - 11:28 Verification/Time Out Taken: Start Time: 11:28 Pain Control: Lidocaine 4% Topical Solution Level: Skin/Subcutaneous Tissue Total Area Debrided (L x 0.4 (cm) x 0.7 (cm) = 0.28 (cm) W): Tissue and other Eschar, Exudate, Fibrin/Slough, Subcutaneous material debrided: Instrument: Curette Bleeding: Minimum Hemostasis Achieved: Pressure End Time: 11:30 Procedural Pain: 0 Post Procedural Pain: 0 Response to Treatment: Procedure was tolerated well Post Debridement Measurements of Total Wound Length: (cm) 0.5 Width: (cm) 0.8 Depth: (cm) 0.3 Volume: (cm) 0.094 Character of Wound/Ulcer Post Stable Debridement: Severity of Tissue Post Debridement: Fat layer exposed Post Procedure Diagnosis Same as  Pre-procedure Electronic Signature(s) Signed: 01/29/2016 5:35:18 PM By: Regan Lemming BSN, RN Signed: 01/29/2016 6:13:30 PM By: Linton Ham MD Mark [name], Mark [name] ([id number]) Entered By: Linton Ham on 01/29/2016 13:57:10 Mark [name], Mark [name] ([id number]) -------------------------------------------------------------------------------- HPI Details Mark [name] Date of Service: 01/29/2016 10:45 AM Patient Name: H. Patient Account Number: [id number] Medical Record Treating RN: Baruch Gouty RN, BSN, Velva Harman [id number] Number: Other Clinician: Date of Birth/Sex: 1941/02/07 (75 y.o. Male) Treating Linton Ham Primary Care Physician: Rochel Brome Physician/Extender: G Referring Physician: COX, KIRSTEN Weeks in Treatment: 2 History of Present Illness Location: Sacrum, dorsal right great toe, and  left great toe amputation site. Quality: Patient describes the pain as a soreness and sometimes burning sensation Severity: 4 out of 5 Duration: developed in the last 4 months due to patient having to sit through dialysis secondary to his kidney failing Timing: The patient describes the pain as being fairly persistent mainly in the sacral region although this is worse is if he is laying on it. Context: As a result of pressure in regard to the sacral ulcer. The lower extremity wounds of the toe her diabetic in nature though the left great toe amputation site is obviously a surgical wound. Modifying Factors: It appears that in the facility Santyl has been used on patient's wounds currently. Associated Signs and Symptoms: Patient has left great toe amputation, diabetes mellitus type 2, status post kidney transplant with subsequent kidney failure due to dye used for imaging studies, chronic atrial fibrillation, congestive heart failure HPI Description: 01/14/16 patient presents for initial evaluation concerning ulcers that he has over his sacral region which have developed more recently a result of  him having to go back on dialysis secondary to his transplanted kidney failing which apparently was due to dye given for an imaging study. With that being said he tells me that he tries as much as possible to offload one way or another during the time that he is having to sit for dialysis. However nonetheless he has developed the pressure ulcer to the sacral region. Subsequently he also has a diabetic foot ulcer to the dorsal surface of his right great toe to which Santyl has been used topically at this point in time. He also has a still open amputation site regard to his left great toe with necrotic tissue present. this amputation was performed on November 06, 2015 Dr. Oneida Alar. Due to osteomyelitis of the left first toe.This still has not achieved a good granulation bed. 01/21/16 patient was admitted to the clinic last week by Jeri Cos PA. He has a left first toe amputation site wound just distal to his MTP. A wound over the interphalangeal joint of his right toe and a pressure ulcer on his right buttock. During the recent hospitalization tells me he went into acute renal failure on a transplanted kidney and is now back on dialysis. He is leaving a nursing home today and going home to his wife. I don't see any specific vascular information at this point I'll need to research [facility] on this. He apparently had osteomyelitis of the left first toe which was the reason for the amputation. The patient is a type II diabetic 01/29/16; the patient's buttock area on the right is totally healed. He has a difficult wound over the interphalangeal joint of his right great toe. Also a difficult wound on the left great toe amputation site. Electronic Signature(s) Signed: 01/29/2016 6:13:30 PM By: Linton Ham MD Entered By: Linton Ham on 01/29/2016 13:58:37 Mark [name], Mark [name] ([id number]) Mark [name], Mark [name]  ([id number]) -------------------------------------------------------------------------------- Physical Exam Details Mark [name] Date of Service: 01/29/2016 10:45 AM Patient Name: H. Patient Account Number: [id number] Medical Record Treating RN: Baruch Gouty RN, BSN, Velva Harman [id number] Number: Other Clinician: Date of Birth/Sex: December 01, 1940 (75 y.o. Male) Treating Linton Ham Primary Care Physician: COX, KIRSTEN Physician/Extender: G Referring Physician: COX, KIRSTEN Weeks in Treatment: 2 Constitutional Sitting or standing Blood Pressure is within target range for patient.. Pulse regular and within target range for patient.Marland Kitchen Respirations regular, non-labored and within target range.. Temperature is normal and within the target range for the patient.. Patient's appearance is  neat and clean. Appears in no acute distress. Well nourished and well developed.. Notes Wound exam; the area over his right buttock has resolved there is no open area here. oThe area over his left great toe amputation site on the medial aspect probes more deeply. I do not feel bone. I did a culture of this area. oOver the interphalangeal joint of his right great toe wound areas exposed bone. I need to scrape this down some oX-rays ordered of both feet, culture of the left great toe wound medial aspect of the amputation site Electronic Signature(s) Signed: 01/29/2016 6:13:30 PM By: Linton Ham MD Entered By: Linton Ham on 01/29/2016 14:13:44 Mark [name], Mark [name] ([id number]) -------------------------------------------------------------------------------- Physician Orders Details Mark [name] Date of Service: 01/29/2016 10:45 AM Patient Name: H. Patient Account Number: [id number] Medical Record Treating RN: Baruch Gouty RN, BSN, Velva Harman [id number] Number: Other Clinician: Date of Birth/Sex: 1940-12-08 (75 y.o. Male) Treating Linton Ham Primary Care Physician: COX, KIRSTEN Physician/Extender: G Referring  Physician: Edmonia Caprio in Treatment: 2 Verbal / Phone Orders: Yes Clinician: Afful, RN, BSN, Rita Read Back and Verified: Yes Diagnosis Coding Wound Cleansing Wound #1 Left Toe Great o Clean wound with Normal Saline. o May shower with protection. Wound #2 Right,Dorsal Toe Great o Clean wound with Normal Saline. o May shower with protection. Anesthetic Wound #1 Left Toe Great o Topical Lidocaine 4% cream applied to wound bed prior to debridement Wound #2 Right,Dorsal Toe Great o Topical Lidocaine 4% cream applied to wound bed prior to debridement Skin Barriers/Peri-Wound Care Wound #1 Left Toe Great o Barrier cream Wound #2 Right,Dorsal Toe Great o Barrier cream Primary Wound Dressing Wound #1 Left Toe Great o Santyl Ointment Wound #2 Right,Dorsal Toe Great o Santyl Ointment Secondary Dressing Wound #1 Left Toe Great o Gauze and Kerlix/Conform Sorlie, Akif H. ([id number]) Wound #2 Right,Dorsal Toe Great o Gauze and Kerlix/Conform Dressing Change Frequency Wound #1 Left Toe Great o Change dressing every day. Wound #2 Right,Dorsal Toe Great o Change dressing every day. Follow-up Appointments Wound #1 Left Toe Great o Return Appointment in 1 week. Wound #2 Right,Dorsal Toe Great o Return Appointment in 1 week. Off-Loading o Turn and reposition every 2 hours o Mattress - Per Patient he is on an air mattress Additional Orders / Instructions Wound #1 Left Toe Great o Increase protein intake. o Activity as tolerated o Other: - [employer], Vitamin A, C, MVI Wound #2 Right,Dorsal Toe Great o Increase protein intake. o Activity as tolerated o Other: - [employer], Vitamin A, C, MVI Laboratory o Bacteria identified in Wound by Culture (MICRO) - left great toe amp site oooo LOINC Code: [id number] oooo Convenience Name: Wound culture routine Radiology o X-ray, foot - right great toe, left great toe:amp site Electronic  Signature(s) Signed: 01/29/2016 5:35:18 PM By: Regan Lemming BSN, RN Signed: 01/29/2016 6:13:30 PM By: Linton Ham MD Mark [name], Mark [name] ([id number]) Entered By: Regan Lemming on 01/29/2016 11:36:21 Mark [name], Mark [name] ([id number]) -------------------------------------------------------------------------------- Problem List Details Mark [name] Date of Service: 01/29/2016 10:45 AM Patient Name: H. Patient Account Number: [id number] Medical Record Treating RN: Baruch Gouty RN, BSN, Velva Harman [id number] Number: Other Clinician: Date of Birth/Sex: [birth year] (75 y.o. 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, sequela 01/15/2016 Yes Z89.412 Acquired absence of left great toe 01/15/2016 Yes L97.512 Non-pressure chronic ulcer of other part of right foot with 01/15/2016 Yes fat layer exposed L97.522 Non-pressure chronic ulcer of other part of left foot with fat 01/15/2016 Yes layer exposed Inactive Problems Resolved Problems Electronic Signature(s) Mark [name], Mark [name] ([id number]) Signed: 01/29/2016 6:13:30 PM By: Linton Ham MD Entered By: Linton Ham on 01/29/2016 13:54:38 Mark [name], Mark [name] ([id number]) -------------------------------------------------------------------------------- Progress Note Details Mark [name] Date of Service: 01/29/2016 10:45 AM Patient Name: H. Patient Account Number: [id number] Medical Record Treating RN: Baruch Gouty RN, BSN, Velva Harman [id number] Number: Other Clinician: Date of  Birth/Sex: 20-Jan-1941 (75 y.o. Male) Treating Linton Ham Primary Care Physician: COX, KIRSTEN Physician/Extender: G Referring Physician: Edmonia Caprio in Treatment: 2 Subjective Chief Complaint Information obtained from Patient Evaluation regarding patient's nonhealing left great toe amputation, right great toe ulcer, and sacral pressure ulcer History of Present Illness (HPI) The following HPI elements were documented for the patient's wound: Location: Sacrum, dorsal right great toe, and left great toe amputation site. Quality: Patient describes the pain as a soreness and sometimes burning sensation Severity: 4 out of 5 Duration: developed in the last 4 months due to patient having to sit through dialysis secondary to his kidney failing Timing: The patient describes the pain as being fairly persistent mainly in the sacral region although this is worse is if he is laying on it. Context: As a result of pressure in regard to the sacral ulcer. The lower extremity wounds of the toe her diabetic in nature though the left great toe amputation site is obviously a surgical wound. Modifying Factors: It appears that in the facility Santyl has been used on patient's wounds currently. Associated Signs and Symptoms: Patient has left great toe amputation, diabetes mellitus type 2, status post kidney transplant with subsequent kidney failure due to dye used for imaging studies, chronic atrial fibrillation, congestive heart failure 01/14/16 patient presents for initial evaluation concerning ulcers that he has over his sacral region which have developed more recently a result of him having to go back on dialysis secondary to his transplanted kidney failing which apparently was due to dye given for an imaging study. With that being said he tells me that he tries as much as possible to offload one way or another during the time that he is having to sit for dialysis. However nonetheless he has  developed the pressure ulcer to the sacral region. Subsequently he also has a diabetic foot ulcer to the dorsal surface of his right great toe to which Santyl has been used topically at this point in time. He also has a still open amputation site regard to his left great toe with necrotic tissue present. this amputation was performed on November 06, 2015 Dr. Oneida Alar. Due to osteomyelitis of the left first toe.This still has not achieved a good granulation bed. 01/21/16 patient was admitted to the clinic last week by Jeri Cos PA. He has a left first toe amputation site wound just distal to his MTP. A wound over the interphalangeal joint of his right toe and a pressure ulcer on his right buttock. During the recent hospitalization tells me he went into acute renal failure on a transplanted kidney and is now back on dialysis. He is leaving a nursing home today and going home to his wife. I don't see any specific vascular information at this point I'll need  to research [facility] on this. He apparently had osteomyelitis of the left first toe which was the reason for the amputation. The patient is KAYVAN, LASKEY ([id number]) a type II diabetic 01/29/16; the patient's buttock area on the right is totally healed. He has a difficult wound over the interphalangeal joint of his right great toe. Also a difficult wound on the left great toe amputation site. Objective Constitutional Sitting or standing Blood Pressure is within target range for patient.. Pulse regular and within target range for patient.Marland Kitchen Respirations regular, non-labored and within target range.. Temperature is normal and within the target range for the patient.. Patient's appearance is neat and clean. Appears in no acute distress. Well nourished and well developed.. Vitals Time Taken: 11:02 AM, Height: 67 in, Weight: 190 lbs, BMI: 29.8, Temperature: 97.5 F, Pulse: 74 bpm, Respiratory Rate: 17 breaths/min, Blood Pressure: 119/73  mmHg. General Notes: Wound exam; the area over his right buttock has resolved there is no open area here. The area over his left great toe amputation site on the medial aspect probes more deeply. I do not feel bone. I did a culture of this area. Over the interphalangeal joint of his right great toe wound areas exposed bone. I need to scrape this down some X-rays ordered of both feet, culture of the left great toe wound medial aspect of the amputation site Integumentary (Hair, Skin) Wound #1 status is Open. Original cause of wound was Surgical Injury. The wound is located on the Left Toe Great. The wound measures 1cm length x 1.5cm width x 0.2cm depth; 1.178cm^2 area and 0.236cm^3 volume. The wound is limited to skin breakdown. There is no tunneling or undermining noted. There is a large amount of serosanguineous drainage noted. The wound margin is distinct with the outline attached to the wound base. There is small (1-33%) granulation within the wound bed. There is a large (67-100%) amount of necrotic tissue within the wound bed including Adherent Slough. The periwound skin appearance exhibited: Moist. The periwound skin appearance did not exhibit: Callus, Crepitus, Excoriation, Fluctuance, Friable, Induration, Localized Edema, Rash, Scarring, Dry/Scaly, Maceration, Atrophie Blanche, Cyanosis, Ecchymosis, Hemosiderin Staining, Mottled, Pallor, Rubor, Erythema. Periwound temperature was noted as No Abnormality. The periwound has tenderness on palpation. Wound #2 status is Open. Original cause of wound was Gradually Appeared. The wound is located on the Right,Dorsal [employer] The wound measures 0.4cm length x 0.7cm width x 0.2cm depth; 0.22cm^2 area and 0.044cm^3 volume. The wound is limited to skin breakdown. There is no tunneling or undermining noted. There is a medium amount of serosanguineous drainage noted. The wound margin is distinct with the outline attached to the wound base. There is no  granulation within the wound bed. There is a large (67- 100%) amount of necrotic tissue within the wound bed including Adherent Slough. The periwound skin appearance exhibited: Moist. The periwound skin appearance did not exhibit: Callus, Crepitus, Excoriation, Fluctuance, Friable, Induration, Localized Edema, Rash, Scarring, Dry/Scaly, Maceration, Atrophie Blanche, Cyanosis, Ecchymosis, Hemosiderin Staining, Mottled, Pallor, Rubor, Erythema. Periwound temperature was noted as No Abnormality. KOHEN, CLEMENSEN ([id number]) Wound #4 status is Healed - Epithelialized. Original cause of wound was Pressure Injury. The wound is located on the Sacrum. The wound measures 0cm length x 0cm width x 0cm depth; 0cm^2 area and 0cm^3 volume. The wound is limited to skin breakdown. There is no tunneling or undermining noted. There is a none present amount of drainage noted. The wound margin is distinct with the outline attached to  the wound base. There is no granulation within the wound bed. There is no necrotic tissue within the wound bed. The periwound skin appearance exhibited: Moist. The periwound skin appearance did not exhibit: Callus, Crepitus, Excoriation, Fluctuance, Friable, Induration, Localized Edema, Rash, Scarring, Dry/Scaly, Maceration, Atrophie Blanche, Cyanosis, Ecchymosis, Hemosiderin Staining, Mottled, Pallor, Rubor, Erythema. Periwound temperature was noted as No Abnormality. 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 There was a Skin/Subcutaneous Tissue Debridement [id number] debridement with total area of 1.5 sq cm performed by Ricard Dillon, MD. with the following instrument(s): Curette to remove Non-Viable tissue/material including Exudate, Fibrin/Slough, Eschar, and Subcutaneous after achieving pain control using Lidocaine 4% Topical Solution. A time out was conducted at 11:28, prior to the start of the procedure. A Minimum amount of bleeding was controlled with Pressure. The procedure was tolerated well with a pain level of 0 throughout and a pain level of 0 following the procedure. Post Debridement Measurements: 1cm length x 1.5cm width x 0.2cm depth; 0.236cm^3 volume. Character of Wound/Ulcer Post Debridement requires further debridement. Severity of Tissue Post Debridement is: Fat layer exposed. Post procedure Diagnosis Wound #1: Same as Pre-Procedure Wound #2 XYAN, FAVINGER. ([id number]) Wound #2 is a Diabetic Wound/Ulcer of the Lower Extremity located on the Right,Dorsal Toe Great . There was a Skin/Subcutaneous Tissue Debridement [id number] debridement with total area of 0.28 sq cm performed by Ricard Dillon, MD. with the following instrument(s): Curette including Exudate, Fibrin/Slough, Eschar, and Subcutaneous after achieving pain control using Lidocaine 4% Topical Solution. A time out was conducted at 11:28, prior to the start of the procedure. A Minimum amount of bleeding was controlled with Pressure. The procedure was tolerated well with a pain level of 0 throughout and a pain level of 0 following the procedure. Post Debridement Measurements: 0.5cm length x 0.8cm width x 0.3cm depth; 0.094cm^3 volume. Character of Wound/Ulcer Post Debridement is stable. Severity of Tissue Post Debridement is: Fat layer exposed. Post procedure Diagnosis Wound #2: Same as  Pre-Procedure Plan Wound Cleansing: Wound #1 Left Toe Great: Clean wound with Normal Saline. May shower with protection. Wound #2 Right,Dorsal Toe Great: Clean wound with Normal Saline. May shower with protection. Anesthetic: Wound #1 Left Toe Great: Topical Lidocaine 4% cream applied to wound bed prior to debridement Wound #2 Right,Dorsal Toe Great: Topical Lidocaine 4% cream applied to wound bed prior to debridement Skin Barriers/Peri-Wound Care: Wound #1 Left Toe Great: Barrier cream Wound #2 Right,Dorsal Toe Great: Barrier cream Primary Wound Dressing: Wound #1 Left Toe Great: Santyl Ointment Wound #2 Right,Dorsal Toe Great: Santyl Ointment Secondary Dressing: Wound #1 Left Toe Great: Gauze and Kerlix/Conform Wound #2 Right,Dorsal Toe Great: Gauze and Kerlix/Conform Dressing Change Frequency: Wound #1 Left Toe Great: Change dressing every day. Wound #2 Right,Dorsal Toe GreatGRAE, NISKA. ([id number]) Change dressing every day. Follow-up Appointments: Wound #1 Left Toe Great: Return Appointment in 1 week. Wound #2 Right,Dorsal Toe Great: Return Appointment  in 1 week. Off-Loading: Turn and reposition every 2 hours Mattress - Per Patient he is on an air mattress Additional Orders / Instructions: Wound #1 Left Toe Great: Increase protein intake. Activity as tolerated Other: - ZInc, Vitamin A, C, MVI Wound #2 Right,Dorsal Toe Great: Increase protein intake. Activity as tolerated Other: - ZInc, Vitamin A, C, MVI Laboratory ordered were: Wound culture routine - left great toe amp site Radiology ordered were: X-ray, foot - right great toe, left great toe:amp site #1continue with santyl to both wound areas #2 culture done of the medial aspect of the amputation site #3xrays ordered of both feet Electronic Signature(s) Signed: 01/29/2016 6:13:30 PM By: Linton Ham MD Entered By: Linton Ham on 01/29/2016 14:16:18 JOSEMIGUEL, SETTLE  ([id number]) -------------------------------------------------------------------------------- SuperBill Details Mark [name] Date of Service: 01/29/2016 Patient Name: H. Patient Account Number: [id number] Medical Record Treating RN: Baruch Gouty RN, BSN, Velva Harman [id number] Number: Other Clinician: Date of Birth/Sex: 19-Jan-1941 (75 y.o. 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 L97.522 Non-pressure chronic ulcer of other part of left foot with fat layer exposed Facility Procedures CPT4 Code Description: [id number] - DEB SUBQ TISSUE 20 SQ CM/< ICD-10 Description Diagnosis E11.621 Type 2 diabetes mellitus with foot ulcer L97.512 Non-pressure chronic ulcer of other part of right fo Modifier: ot with fat la Quantity: 1 yer exposed Physician Procedures CPT4 Code Description: [id number] - WC PHYS SUBQ TISS 20 SQ CM ICD-10 Description Diagnosis E11.621 Type 2 diabetes mellitus with foot ulcer L97.512 Non-pressure chronic ulcer of other part of right fo Modifier: ot with fat lay Quantity: 1 er exposed Electronic Signature(s) Signed: 01/29/2016 6:13:30 PM By: Linton Ham MD Entered By: Linton Ham on 01/29/2016 14:16:57

## 2016-01-30 NOTE — Progress Notes (Signed)
CHANCELER, KOLB ([id number]) Visit Report for 01/29/2016 Arrival Information Details Patient Name: Mark [name], Mark [name]. Date of Service: 01/29/2016 10:45 AM Medical Record Number: [id number] Patient Account Number: [id number] Date of Birth/Sex: 1941/02/04 (75 y.o. Male) Treating RN: Baruch Gouty, RN, BSN, Velva Harman Primary Care Physician: COX, KIRSTEN Other Clinician: Referring Physician: COX, KIRSTEN Treating Physician/Extender: Tito Dine in Treatment: 2 Visit Information History Since Last Visit All ordered tests and consults were completed: No Patient Arrived: Wheel Chair Added or deleted any medications: No Arrival Time: 10:59 Any new allergies or adverse reactions: No Accompanied By: niece Had a fall or experienced change in No Transfer Assistance: [employer] activities of daily living that may affect Patient Lift risk of falls: Patient Identification Verified: Yes Signs or symptoms of abuse/neglect since last No Secondary Verification Process Yes visito Completed: Hospitalized since last visit: No Patient Requires Transmission- No Has Dressing in Place as Prescribed: Yes Based Precautions: Pain Present Now: No Patient Has Alerts: No Electronic Signature(s) Signed: 01/29/2016 5:35:18 PM By: Regan Lemming BSN, RN Entered By: Regan Lemming on 01/29/2016 10:59:44 Mark [name] ([id number]) -------------------------------------------------------------------------------- Encounter Discharge Information Details Patient Name: Mark [name]. Date of Service: 01/29/2016 10:45 AM Medical Record Number: [id number] Patient Account Number: [id number] Date of Birth/Sex: 1941-01-09 (75 y.o. Male) Treating RN: Baruch Gouty, RN, BSN, Velva Harman Primary Care Physician: COX, KIRSTEN Other Clinician: Referring Physician: COX, KIRSTEN Treating Physician/Extender: Tito Dine in Treatment: 2 Encounter Discharge Information Items Discharge Pain Level: 0 Discharge Condition:  Stable Ambulatory Status: Wheelchair Discharge Destination: Home Transportation: Private Auto Accompanied By: niece Schedule Follow-up Appointment: No Medication Reconciliation completed and provided to Patient/Care No Doretta Remmert: Provided on Clinical Summary of Care: 01/29/2016 Form Type Recipient Paper Patient [facility] Electronic Signature(s) Signed: 01/29/2016 5:14:56 PM By: Regan Lemming BSN, RN Previous Signature: 01/29/2016 11:48:03 AM Version By: Ruthine Dose Entered By: Regan Lemming on 01/29/2016 17:14:56 Mark [name] ([id number]) -------------------------------------------------------------------------------- Lower Extremity Assessment Details Patient Name: Mark [name]. Date of Service: 01/29/2016 10:45 AM Medical Record Number: [id number] Patient Account Number: [id number] Date of Birth/Sex: 1940-09-18 (75 y.o. Male) Treating RN: Baruch Gouty, RN, BSN, Velva Harman Primary Care Physician: COX, KIRSTEN Other Clinician: Referring Physician: COX, KIRSTEN Treating Physician/Extender: Ricard Dillon Weeks in Treatment: 2 Vascular Assessment Pulses: Posterior Tibial Dorsalis Pedis Palpable: [Left:Yes] [Right:Yes] Extremity colors, hair growth, and conditions: Extremity Color: [Left:Mottled] [Right:Mottled] Hair Growth on Extremity: [Left:No] [Right:No] Temperature of Extremity: [Left:Warm] [Right:Warm] Capillary Refill: [Left:< 3 seconds] [Right:< 3 seconds] Toe Nail Assessment Left: Right: Thick: Yes Yes Discolored: Yes Yes Deformed: No No Improper Length and Hygiene: No No Electronic Signature(s) Signed: 01/29/2016 5:35:18 PM By: Regan Lemming BSN, RN Entered By: Regan Lemming on 01/29/2016 11:01:10 Mark [name] ([id number]) -------------------------------------------------------------------------------- Multi Wound Chart Details Patient Name: Mark [name]. Date of Service: 01/29/2016 10:45 AM Medical Record Number: [id number] Patient Account Number:  [id number] Date of Birth/Sex: 10/01/1940 (75 y.o. Male) Treating RN: Baruch Gouty, RN, BSN, Velva Harman Primary Care Physician: COX, KIRSTEN Other Clinician: Referring Physician: COX, KIRSTEN Treating Physician/Extender: Ricard Dillon Weeks in Treatment: 2 Vital Signs Height(in): 67 Pulse(bpm): 74 Weight(lbs): 190 Blood Pressure 119/73 (mmHg): Body Mass Index(BMI): 30 Temperature(F): 97.5 Respiratory Rate 17 (breaths/min): Photos: [1:No Photos] [2:No Photos] [4:No Photos] Wound Location: [1:Left Toe Great] [2:Right Toe Great - Dorsal Sacrum] Wounding Event: [1:Surgical Injury] [2:Gradually Appeared] [4:Pressure Injury] Primary Etiology: [1:Diabetic Wound/Ulcer of Diabetic Wound/Ulcer of Pressure Ulcer the Lower Extremity] [2:the Lower Extremity] Comorbid History: [1:Chronic Obstructive Pulmonary Disease (COPD), Arrhythmia, Hypertension, Myocardial Hypertension, Myocardial Hypertension,  Myocardial Infarction, Type II Diabetes, End Stage Renal Disease, Gout, Osteoarthritis, Neuropathy  Osteoarthritis, Neuropathy Osteoarthritis, Neuropathy] [2:Chronic Obstructive Pulmonary Disease (COPD), Arrhythmia, Infarction, Type II Diabetes, End Stage Renal Disease, Gout,] [4:Chronic Obstructive Pulmonary Disease (COPD), Arrhythmia, Infarction,  Type II Diabetes, End Stage Renal Disease, Gout,] Date Acquired: [1:10/16/2015] [2:12/10/2015] [4:11/12/2015] Weeks of Treatment: [1:2] [2:2] [4:2] Wound Status: [1:Open] [2:Open] [4:Healed - Epithelialized] Measurements L x W x D 1x1.5x0.2 [2:0.4x0.7x0.2] [4:0x0x0] (cm) Area (cm) : [1:1.178] [2:0.22] [4:0] Volume (cm) : [1:0.236] [2:0.044] [4:0] % Reduction in Area: [1:72.80%] [2:50.00%] [4:100.00%] % Reduction in Volume: 81.90% [2:0.00%] [4:100.00%] Classification: [1:Grade 1] [2:Grade 1] [4:Category/Stage II] Exudate Amount: [1:Large] [2:Medium] [4:None Present] Exudate Type: [1:Serosanguineous] [2:Serosanguineous] [4:N/A] Exudate Color: [1:red, brown] [2:red,  brown] [4:N/A] Wound Margin: [1:Distinct, outline attached Distinct, outline attached Distinct, outline attached] Granulation Amount: [1:Small (1-33%)] [2:None Present (0%)] [4:None Present (0%)] Necrotic Amount: [1:Large (67-100%)] [2:Large (67-100%)] [4:None Present (0%)] Exposed Structures: Cotham, Andrian H. ([id number]) Fascia: No Fascia: No Fascia: No Fat: No Fat: No Fat: No Tendon: No Tendon: No Tendon: No Muscle: No Muscle: No Muscle: No Joint: No Joint: No Joint: No Bone: No Bone: No Bone: No Limited to Skin Limited to Skin Limited to Skin Breakdown Breakdown Breakdown Epithelialization: None None Large (67-100%) Periwound Skin Texture: Edema: No Edema: No Edema: No Excoriation: No Excoriation: No Excoriation: No Induration: No Induration: No Induration: No Callus: No Callus: No Callus: No Crepitus: No Crepitus: No Crepitus: No Fluctuance: No Fluctuance: No Fluctuance: No Friable: No Friable: No Friable: No Rash: No Rash: No Rash: No Scarring: No Scarring: No Scarring: No Periwound Skin Moist: Yes Moist: Yes Moist: Yes Moisture: Maceration: No Maceration: No Maceration: No Dry/Scaly: No Dry/Scaly: No Dry/Scaly: No Periwound Skin Color: Atrophie Blanche: No Atrophie Blanche: No Atrophie Blanche: No Cyanosis: No Cyanosis: No Cyanosis: No Ecchymosis: No Ecchymosis: No Ecchymosis: No Erythema: No Erythema: No Erythema: No Hemosiderin Staining: No Hemosiderin Staining: No Hemosiderin Staining: No Mottled: No Mottled: No Mottled: No Pallor: No Pallor: No Pallor: No Rubor: No Rubor: No Rubor: No Temperature: No Abnormality No Abnormality No Abnormality Tenderness on Yes No No Palpation: Wound Preparation: Ulcer Cleansing: Ulcer Cleansing: Ulcer Cleansing: Rinsed/Irrigated with Rinsed/Irrigated with Rinsed/Irrigated with Saline Saline Saline Topical Anesthetic Topical Anesthetic Topical Anesthetic Applied: Other: lidocaine  Applied: Other: lidocaine Applied: None 4% 4% Treatment Notes Electronic Signature(s) Signed: 01/29/2016 5:35:18 PM By: Regan Lemming BSN, RN Entered By: Regan Lemming on 01/29/2016 11:27:45 LEESHAWN, SHAYNE ([id number]) -------------------------------------------------------------------------------- Multi-Disciplinary Care Plan Details Patient Name: Mark [name]. Date of Service: 01/29/2016 10:45 AM Medical Record Number: [id number] Patient Account Number: [id number] Date of Birth/Sex: 1940/09/01 (75 y.o. Male) Treating RN: Baruch Gouty, RN, BSN, Velva Harman Primary Care Physician: COX, KIRSTEN Other Clinician: Referring Physician: COX, KIRSTEN Treating Physician/Extender: Tito Dine in Treatment: 2 Active Inactive Orientation to the Wound Care Program Nursing Diagnoses: Knowledge deficit related to the wound healing center program Goals: Patient/caregiver will verbalize understanding of the [facility] Date Initiated: 01/14/2016 Goal Status: Active Interventions: Provide education on orientation to the wound center Notes: Venous Leg Ulcer Nursing Diagnoses: Knowledge deficit related to disease process and management Potential for venous Insuffiency (use before diagnosis confirmed) Goals: Patient will maintain optimal edema control Date Initiated: 01/14/2016 Goal Status: Active Patient/caregiver will verbalize understanding of disease process and disease management Date Initiated: 01/14/2016 Goal Status: Active Verify adequate tissue perfusion prior to therapeutic compression application Date Initiated: 01/14/2016 Goal Status: Active Interventions: Assess peripheral edema status every visit. Provide education  on venous insufficiency Notes: TIDUS, BAUMGARTNER ([id number]) Wound/Skin Impairment Nursing Diagnoses: Impaired tissue integrity Knowledge deficit related to ulceration/compromised skin integrity Goals: Patient/caregiver will verbalize  understanding of skin care regimen Date Initiated: 01/14/2016 Goal Status: Active Ulcer/skin breakdown will have a volume reduction of 30% by week 4 Date Initiated: 01/14/2016 Goal Status: Active Ulcer/skin breakdown will have a volume reduction of 50% by week 8 Date Initiated: 01/14/2016 Goal Status: Active Ulcer/skin breakdown will have a volume reduction of 80% by week 12 Date Initiated: 01/14/2016 Goal Status: Active Ulcer/skin breakdown will heal within 14 weeks Date Initiated: 01/14/2016 Goal Status: Active Interventions: Assess patient/caregiver ability to obtain necessary supplies Assess patient/caregiver ability to perform ulcer/skin care regimen upon admission and as needed Assess ulceration(s) every visit Provide education on ulcer and skin care Treatment Activities: Skin care regimen initiated : 01/14/2016 Topical wound management initiated : 01/14/2016 Notes: Electronic Signature(s) Signed: 01/29/2016 5:35:18 PM By: Regan Lemming BSN, RN Entered By: Regan Lemming on 01/29/2016 11:12:24 Mark [name] ([id number]) -------------------------------------------------------------------------------- Pain Assessment Details Patient Name: Mark [name]. Date of Service: 01/29/2016 10:45 AM Medical Record Number: [id number] Patient Account Number: [id number] Date of Birth/Sex: 1940-09-03 (75 y.o. Male) Treating RN: Baruch Gouty, RN, BSN, Velva Harman Primary Care Physician: Rochel Brome Other Clinician: Referring Physician: COX, KIRSTEN Treating Physician/Extender: Ricard Dillon Weeks in Treatment: 2 Active Problems Location of Pain Severity and Description of Pain Patient Has Paino No Site Locations With Dressing Change: No Pain Management and Medication Current Pain Management: Electronic Signature(s) Signed: 01/29/2016 5:35:18 PM By: Regan Lemming BSN, RN Entered By: Regan Lemming on 01/29/2016 10:59:22 Mark [name]  ([id number]) -------------------------------------------------------------------------------- Patient/Caregiver Education Details Patient Name: Mark [name]. Date of Service: 01/29/2016 10:45 AM Medical Record Number: [id number] Patient Account Number: [id number] Date of Birth/Gender: [birth year] (75 y.o. Male) Treating RN: Baruch Gouty, RN, BSN, Velva Harman Primary Care Physician: COX, KIRSTEN Other Clinician: Referring Physician: COX, KIRSTEN Treating Physician/Extender: Tito Dine in Treatment: 2 Education Assessment Education Provided To: Patient and Caregiver Education Topics Provided Venous: Methods: Explain/Verbal Responses: State content correctly Welcome To [facility]: Methods: Explain/Verbal Responses: State content correctly Wound/Skin Impairment: Methods: Explain/Verbal Responses: State content correctly Electronic Signature(s) Signed: 01/29/2016 5:35:18 PM By: Regan Lemming BSN, RN Entered By: Regan Lemming on 01/29/2016 17:15:15 Mark [name] ([id number]) -------------------------------------------------------------------------------- Wound Assessment Details Patient Name: [name] H. Date of Service: 01/29/2016 10:45 AM Medical Record Number: [id number] Patient Account Number: [id number] Date of Birth/Sex: 1940/06/23 (75 y.o. Male) Treating RN: Baruch Gouty, RN, BSN, [place] Primary Care Physician: COX, KIRSTEN Other Clinician: Referring Physician: COX, KIRSTEN Treating Physician/Extender: Ricard Dillon Weeks in Treatment: 2 Wound Status Wound Number: 1 Primary Diabetic Wound/Ulcer of the Lower Etiology: Extremity Wound Location: Left Toe Great Wound Open Wounding Event: Surgical Injury Status: Date Acquired: 10/16/2015 Comorbid Chronic Obstructive Pulmonary Disease Weeks Of Treatment: 2 History: (COPD), Arrhythmia, Hypertension, Clustered Wound: No Myocardial Infarction, Type II Diabetes, End Stage Renal Disease,  Gout, Osteoarthritis, Neuropathy Photos Photo Uploaded By: Regan Lemming on 01/29/2016 17:07:53 Wound Measurements Length: (cm) 1 Width: (cm) 1.5 Depth: (cm) 0.2 Area: (cm) 1.178 Volume: (cm) 0.236 % Reduction in Area: 72.8% % Reduction in Volume: 81.9% Epithelialization: None Tunneling: No Undermining: No Wound Description Classification: Grade 1 Wound Margin: Distinct, outline attached Exudate Amount: Large Exudate Type: Serosanguineous Exudate Color: red, brown Foul Odor After Cleansing: No Wound Bed Granulation Amount: Small (1-33%) Exposed Structure Necrotic Amount: Large (67-100%) Fascia Exposed: No Necrotic Quality: Adherent Slough Fat Layer Exposed: No Tendon  Exposed: No Marchena, Ramces H. ([id number]) Muscle Exposed: No Joint Exposed: No Bone Exposed: No Limited to Skin Breakdown Periwound Skin Texture Texture Color No Abnormalities Noted: No No Abnormalities Noted: No Callus: No Atrophie Blanche: No Crepitus: No Cyanosis: No Excoriation: No Ecchymosis: No Fluctuance: No Erythema: No Friable: No Hemosiderin Staining: No Induration: No Mottled: No Localized Edema: No Pallor: No Rash: No Rubor: No Scarring: No Temperature / Pain Moisture Temperature: No Abnormality No Abnormalities Noted: No Tenderness on Palpation: Yes Dry / Scaly: No Maceration: No Moist: Yes Wound Preparation Ulcer Cleansing: Rinsed/Irrigated with Saline Topical Anesthetic Applied: Other: lidocaine 4%, Treatment Notes Wound #1 (Left Toe Great) 1. Cleansed with: Clean wound with Normal Saline 4. Dressing Applied: Santyl Ointment 5. Secondary Dressing Applied Kerlix/Conform 6. Footwear/Offloading device applied Wedge shoe 7. Secured with Recruitment consultant) Signed: 01/29/2016 5:35:18 PM By: Regan Lemming BSN, RN Entered By: Regan Lemming on 01/29/2016 11:11:18 HERLIN, CROPLEY  ([id number]) -------------------------------------------------------------------------------- Wound Assessment Details Patient Name: [name] H. Date of Service: 01/29/2016 10:45 AM Medical Record Number: [id number] Patient Account Number: [id number] Date of Birth/Sex: 01-May-1940 (75 y.o. Male) Treating RN: Baruch Gouty, RN, BSN, [place] Primary Care Physician: COX, KIRSTEN Other Clinician: Referring Physician: COX, KIRSTEN Treating Physician/Extender: Ricard Dillon Weeks in Treatment: 2 Wound Status Wound Number: 2 Primary Diabetic Wound/Ulcer of the Lower Etiology: Extremity Wound Location: Right Toe Great - Dorsal Wound Open Wounding Event: Gradually Appeared Status: Date Acquired: 12/10/2015 Comorbid Chronic Obstructive Pulmonary Disease Weeks Of Treatment: 2 History: (COPD), Arrhythmia, Hypertension, Clustered Wound: No Myocardial Infarction, Type II Diabetes, End Stage Renal Disease, Gout, Osteoarthritis, Neuropathy Photos Photo Uploaded By: Regan Lemming on 01/29/2016 17:07:53 Wound Measurements Length: (cm) 0.4 Width: (cm) 0.7 Depth: (cm) 0.2 Area: (cm) 0.22 Volume: (cm) 0.044 % Reduction in Area: 50% % Reduction in Volume: 0% Epithelialization: None Tunneling: No Undermining: No Wound Description Classification: Grade 1 Wound Margin: Distinct, outline attached Exudate Amount: Medium Exudate Type: Serosanguineous Exudate Color: red, brown Foul Odor After Cleansing: No Wound Bed Granulation Amount: None Present (0%) Exposed Structure Necrotic Amount: Large (67-100%) Fascia Exposed: No Necrotic Quality: Adherent Slough Fat Layer Exposed: No Tendon Exposed: No Flavell, Shoji H. ([id number]) Muscle Exposed: No Joint Exposed: No Bone Exposed: No Limited to Skin Breakdown Periwound Skin Texture Texture Color No Abnormalities Noted: No No Abnormalities Noted: No Callus: No Atrophie Blanche: No Crepitus: No Cyanosis: No Excoriation: No Ecchymosis:  No Fluctuance: No Erythema: No Friable: No Hemosiderin Staining: No Induration: No Mottled: No Localized Edema: No Pallor: No Rash: No Rubor: No Scarring: No Temperature / Pain Moisture Temperature: No Abnormality No Abnormalities Noted: No Dry / Scaly: No Maceration: No Moist: Yes Wound Preparation Ulcer Cleansing: Rinsed/Irrigated with Saline Topical Anesthetic Applied: Other: lidocaine 4%, Treatment Notes Wound #2 (Right, Dorsal Toe Great) 1. Cleansed with: Clean wound with Normal Saline 4. Dressing Applied: Santyl Ointment 5. Secondary Dressing Applied Kerlix/Conform 6. Footwear/Offloading device applied Wedge shoe 7. Secured with Recruitment consultant) Signed: 01/29/2016 5:35:18 PM By: Regan Lemming BSN, RN Entered By: Regan Lemming on 01/29/2016 11:11:32 TORIAN, RODRIGUE ([id number]) -------------------------------------------------------------------------------- Wound Assessment Details Patient Name: [name] H. Date of Service: 01/29/2016 10:45 AM Medical Record Number: [id number] Patient Account Number: [id number] Date of Birth/Sex: 1940-09-14 (75 y.o. Male) Treating RN: Baruch Gouty, RN, BSN, Velva Harman Primary Care Physician: COX, KIRSTEN Other Clinician: Referring Physician: COX, KIRSTEN Treating Physician/Extender: Ricard Dillon Weeks in Treatment: 2 Wound Status Wound Number: 4 Primary Pressure Ulcer Etiology: Wound Location: Sacrum Wound Healed -  Epithelialized Wounding Event: Pressure Injury Status: Date Acquired: 11/12/2015 Comorbid Chronic Obstructive Pulmonary Disease Weeks Of Treatment: 2 History: (COPD), Arrhythmia, Hypertension, Clustered Wound: No Myocardial Infarction, Type II Diabetes, End Stage Renal Disease, Gout, Osteoarthritis, Neuropathy Photos Photo Uploaded By: Regan Lemming on 01/29/2016 17:08:30 Wound Measurements Length: (cm) 0 % Reducti Width: (cm) 0 % Reducti Depth: (cm) 0 Epithelia Area: (cm) 0  Tunnelin Volume: (cm) 0 Undermin on in Area: 100% on in Volume: 100% lization: Large (67-100%) g: No ing: No Wound Description Classification: Category/Stage II Wound Margin: Distinct, outline attached Exudate Amount: None Present Foul Odor After Cleansing: No Wound Bed Granulation Amount: None Present (0%) Exposed Structure Necrotic Amount: None Present (0%) Fascia Exposed: No Fat Layer Exposed: No Tendon Exposed: No Muscle Exposed: No Joint Exposed: No Tanzi, Freemon H. ([id number]) Bone Exposed: No Limited to Skin Breakdown Periwound Skin Texture Texture Color No Abnormalities Noted: No No Abnormalities Noted: No Callus: No Atrophie Blanche: No Crepitus: No Cyanosis: No Excoriation: No Ecchymosis: No Fluctuance: No Erythema: No Friable: No Hemosiderin Staining: No Induration: No Mottled: No Localized Edema: No Pallor: No Rash: No Rubor: No Scarring: No Temperature / Pain Moisture Temperature: No Abnormality No Abnormalities Noted: No Dry / Scaly: No Maceration: No Moist: Yes Wound Preparation Ulcer Cleansing: Rinsed/Irrigated with Saline Topical Anesthetic Applied: None Electronic Signature(s) Signed: 01/29/2016 5:35:18 PM By: Regan Lemming BSN, RN Entered By: Regan Lemming on 01/29/2016 11:12:18 Mark [name] ([id number]) -------------------------------------------------------------------------------- Vitals Details Patient Name: Mark [name]. Date of Service: 01/29/2016 10:45 AM Medical Record Number: [id number] Patient Account Number: [id number] Date of Birth/Sex: [birth year] (75 y.o. Male) Treating RN: Baruch Gouty, RN, BSN, [place] Primary Care Physician: COX, KIRSTEN Other Clinician: Referring Physician: COX, KIRSTEN Treating Physician/Extender: Ricard Dillon Weeks in Treatment: 2 Vital Signs Time Taken: 11:02 Temperature (F): 97.5 Height (in): 67 Pulse (bpm): 74 Weight (lbs): 190 Respiratory Rate (breaths/min): 17 Body Mass Index  (BMI): 29.8 Blood Pressure (mmHg): 119/73 Reference Range: 80 - 120 mg / dl Electronic Signature(s) Signed: 01/29/2016 5:35:18 PM By: Regan Lemming BSN, RN Entered By: Regan Lemming on 01/29/2016 11:03:07

## 2016-01-31 DIAGNOSIS — I482 Chronic atrial fibrillation: Secondary | ICD-10-CM | POA: Diagnosis not present

## 2016-01-31 DIAGNOSIS — I132 Hypertensive heart and chronic kidney disease with heart failure and with stage 5 chronic kidney disease, or end stage renal disease: Secondary | ICD-10-CM | POA: Diagnosis not present

## 2016-01-31 DIAGNOSIS — L97522 Non-pressure chronic ulcer of other part of left foot with fat layer exposed: Secondary | ICD-10-CM | POA: Diagnosis not present

## 2016-01-31 DIAGNOSIS — E1122 Type 2 diabetes mellitus with diabetic chronic kidney disease: Secondary | ICD-10-CM | POA: Diagnosis not present

## 2016-01-31 DIAGNOSIS — L89312 Pressure ulcer of right buttock, stage 2: Secondary | ICD-10-CM | POA: Diagnosis not present

## 2016-01-31 DIAGNOSIS — E11621 Type 2 diabetes mellitus with foot ulcer: Secondary | ICD-10-CM | POA: Diagnosis not present

## 2016-02-01 DIAGNOSIS — N2581 Secondary hyperparathyroidism of renal origin: Secondary | ICD-10-CM | POA: Diagnosis not present

## 2016-02-01 DIAGNOSIS — N170 Acute kidney failure with tubular necrosis: Secondary | ICD-10-CM | POA: Diagnosis not present

## 2016-02-01 DIAGNOSIS — D509 Iron deficiency anemia, unspecified: Secondary | ICD-10-CM | POA: Diagnosis not present

## 2016-02-02 LAB — AEROBIC CULTURE W GRAM STAIN (SUPERFICIAL SPECIMEN)

## 2016-02-02 LAB — AEROBIC CULTURE  (SUPERFICIAL SPECIMEN)

## 2016-02-03 DIAGNOSIS — I482 Chronic atrial fibrillation: Secondary | ICD-10-CM | POA: Diagnosis not present

## 2016-02-03 DIAGNOSIS — E1122 Type 2 diabetes mellitus with diabetic chronic kidney disease: Secondary | ICD-10-CM | POA: Diagnosis not present

## 2016-02-03 DIAGNOSIS — I132 Hypertensive heart and chronic kidney disease with heart failure and with stage 5 chronic kidney disease, or end stage renal disease: Secondary | ICD-10-CM | POA: Diagnosis not present

## 2016-02-03 DIAGNOSIS — L89312 Pressure ulcer of right buttock, stage 2: Secondary | ICD-10-CM | POA: Diagnosis not present

## 2016-02-03 DIAGNOSIS — L97522 Non-pressure chronic ulcer of other part of left foot with fat layer exposed: Secondary | ICD-10-CM | POA: Diagnosis not present

## 2016-02-03 DIAGNOSIS — E11621 Type 2 diabetes mellitus with foot ulcer: Secondary | ICD-10-CM | POA: Diagnosis not present

## 2016-02-04 DIAGNOSIS — N170 Acute kidney failure with tubular necrosis: Secondary | ICD-10-CM | POA: Diagnosis not present

## 2016-02-04 DIAGNOSIS — D509 Iron deficiency anemia, unspecified: Secondary | ICD-10-CM | POA: Diagnosis not present

## 2016-02-04 DIAGNOSIS — N186 End stage renal disease: Secondary | ICD-10-CM | POA: Diagnosis not present

## 2016-02-04 DIAGNOSIS — Z992 Dependence on renal dialysis: Secondary | ICD-10-CM | POA: Diagnosis not present

## 2016-02-04 DIAGNOSIS — N2581 Secondary hyperparathyroidism of renal origin: Secondary | ICD-10-CM | POA: Diagnosis not present

## 2016-02-04 DIAGNOSIS — E1129 Type 2 diabetes mellitus with other diabetic kidney complication: Secondary | ICD-10-CM | POA: Diagnosis not present

## 2016-02-05 ENCOUNTER — Encounter: Payer: Medicare Other | Attending: Internal Medicine | Admitting: Internal Medicine

## 2016-02-05 DIAGNOSIS — M109 Gout, unspecified: Secondary | ICD-10-CM | POA: Diagnosis not present

## 2016-02-05 DIAGNOSIS — E785 Hyperlipidemia, unspecified: Secondary | ICD-10-CM | POA: Insufficient documentation

## 2016-02-05 DIAGNOSIS — L89153 Pressure ulcer of sacral region, stage 3: Secondary | ICD-10-CM | POA: Insufficient documentation

## 2016-02-05 DIAGNOSIS — E11621 Type 2 diabetes mellitus with foot ulcer: Secondary | ICD-10-CM | POA: Diagnosis not present

## 2016-02-05 DIAGNOSIS — L97512 Non-pressure chronic ulcer of other part of right foot with fat layer exposed: Secondary | ICD-10-CM | POA: Diagnosis not present

## 2016-02-05 DIAGNOSIS — M1990 Unspecified osteoarthritis, unspecified site: Secondary | ICD-10-CM | POA: Insufficient documentation

## 2016-02-05 DIAGNOSIS — Z89412 Acquired absence of left great toe: Secondary | ICD-10-CM | POA: Insufficient documentation

## 2016-02-05 DIAGNOSIS — I482 Chronic atrial fibrillation: Secondary | ICD-10-CM | POA: Diagnosis not present

## 2016-02-05 DIAGNOSIS — S98112S Complete traumatic amputation of left great toe, sequela: Secondary | ICD-10-CM | POA: Diagnosis not present

## 2016-02-05 DIAGNOSIS — J449 Chronic obstructive pulmonary disease, unspecified: Secondary | ICD-10-CM | POA: Insufficient documentation

## 2016-02-05 DIAGNOSIS — L97522 Non-pressure chronic ulcer of other part of left foot with fat layer exposed: Secondary | ICD-10-CM | POA: Insufficient documentation

## 2016-02-05 DIAGNOSIS — I5033 Acute on chronic diastolic (congestive) heart failure: Secondary | ICD-10-CM | POA: Diagnosis not present

## 2016-02-05 DIAGNOSIS — I252 Old myocardial infarction: Secondary | ICD-10-CM | POA: Diagnosis not present

## 2016-02-05 DIAGNOSIS — X58XXXS Exposure to other specified factors, sequela: Secondary | ICD-10-CM | POA: Diagnosis not present

## 2016-02-05 DIAGNOSIS — E1140 Type 2 diabetes mellitus with diabetic neuropathy, unspecified: Secondary | ICD-10-CM | POA: Insufficient documentation

## 2016-02-05 DIAGNOSIS — N186 End stage renal disease: Secondary | ICD-10-CM | POA: Insufficient documentation

## 2016-02-05 DIAGNOSIS — I132 Hypertensive heart and chronic kidney disease with heart failure and with stage 5 chronic kidney disease, or end stage renal disease: Secondary | ICD-10-CM | POA: Insufficient documentation

## 2016-02-05 DIAGNOSIS — Z992 Dependence on renal dialysis: Secondary | ICD-10-CM | POA: Insufficient documentation

## 2016-02-06 DIAGNOSIS — D509 Iron deficiency anemia, unspecified: Secondary | ICD-10-CM | POA: Diagnosis not present

## 2016-02-06 DIAGNOSIS — N2581 Secondary hyperparathyroidism of renal origin: Secondary | ICD-10-CM | POA: Diagnosis not present

## 2016-02-06 DIAGNOSIS — D649 Anemia, unspecified: Secondary | ICD-10-CM | POA: Diagnosis not present

## 2016-02-06 DIAGNOSIS — N170 Acute kidney failure with tubular necrosis: Secondary | ICD-10-CM | POA: Diagnosis not present

## 2016-02-06 NOTE — Progress Notes (Signed)
LYRIX, WINGERD ([id number]) Visit Report for 02/05/2016 Chief Complaint Document Details Mark [name] Date of Service: 02/05/2016 3:00 PM Patient Name: H. Patient Account Number: [id number] Medical Record Treating [name]: Mark Gouty, [name], BSN, Mark [name] [id number] Number: Other Clinician: Date of Birth/Sex: February 25, 1941 (75 y.o. Male) Treating Mark [name] Primary Care Physician: Mark [name] Physician/Extender: G Referring Physician: Edmonia [name] in Treatment: 3 Information Obtained from: Patient Chief Complaint Evaluation regarding patient's nonhealing left great toe amputation, right great toe ulcer, and sacral pressure ulcer Electronic Signature(s) Signed: 02/05/2016 5:04:57 PM By: Mark [name] Entered By: Mark [name] on 02/05/2016 16:15:39 Mark [name] ([id number]) -------------------------------------------------------------------------------- Debridement Details Mark [name] Date of Service: 02/05/2016 3:00 PM Patient Name: H. Patient Account Number: [id number] Medical Record Treating [name]: Mark Gouty [name], BSN, Mark [name] [id number] Number: Other Clinician: Date of Birth/Sex: [birth year] (75 y.o. Male) Treating Mark [name] Primary Care Physician: Mark [name] Physician/Extender: G Referring Physician: Edmonia [name] in Treatment: 3 Debridement Performed for Wound #1 Left Toe Great Assessment: Performed By: Physician Mark Dillon, [name] Debridement: Debridement Pre-procedure Yes - 15:47 Verification/Time Out Taken: Start Time: 15:47 Pain Control: Lidocaine 4% Topical Solution Level: Skin/Subcutaneous Tissue Total Area Debrided (L x 1 (cm) x 1 (cm) = 1 (cm) W): Tissue and other Non-Viable, Fibrin/Slough, Subcutaneous material debrided: Instrument: Curette Bleeding: Minimum Hemostasis Achieved: Pressure End Time: 15:51 Procedural Pain: 0 Post Procedural Pain: 0 Response to Treatment: Procedure was tolerated well Post Debridement Measurements of  Total Wound Length: (cm) 1 Width: (cm) 1 Depth: (cm) 0.5 Volume: (cm) 0.393 Character of Wound/Ulcer Post Requires Further Debridement Debridement: Severity of Tissue Post Debridement: Fat layer exposed Post Procedure Diagnosis Same as Pre-procedure Electronic Signature(s) Signed: 02/05/2016 5:04:57 PM By: Mark [name] Signed: 02/05/2016 5:51:44 PM By: Mark [name] Mark [name], Mark [name] ([id number]) Entered By: Mark [name] on 02/05/2016 16:14:45 Mark [name] ([id number]) -------------------------------------------------------------------------------- Debridement Details Mark [name] Date of Service: 02/05/2016 3:00 PM Patient Name: H. Patient Account Number: [id number] Medical Record Treating [name]: Mark Gouty, [name], BSN, Mark [name] [id number] Number: Other Clinician: Date of Birth/Sex: 11-23-1940 (75 y.o. Male) Treating Mark [name] Primary Care Physician: Mark [name] Physician/Extender: G Referring Physician: Edmonia [name] in Treatment: 3 Debridement Performed for Wound #2 Right,Dorsal Toe Great Assessment: Performed By: Physician Mark Dillon, [name] Debridement: Debridement Pre-procedure Yes - 15:45 Verification/Time Out Taken: Start Time: 15:45 Pain Control: Lidocaine 4% Topical Solution Level: Skin/Subcutaneous Tissue Total Area Debrided (L x 0.5 (cm) x 0.5 (cm) = 0.25 (cm) W): Tissue and other Non-Viable, Bone, Fibrin/Slough, Subcutaneous material debrided: Instrument: Curette Bleeding: Minimum Hemostasis Achieved: Pressure End Time: 15:47 Procedural Pain: 0 Post Procedural Pain: 0 Response to Treatment: Procedure was tolerated well Post Debridement Measurements of Total Wound Length: (cm) 0.5 Width: (cm) 0.5 Depth: (cm) 0.2 Volume: (cm) 0.039 Character of Wound/Ulcer Post Requires Further Debridement Debridement: Severity of Tissue Post Debridement: Fat layer exposed Post Procedure Diagnosis Same as Pre-procedure Electronic  Signature(s) Signed: 02/05/2016 5:04:57 PM By: Mark [name] Signed: 02/05/2016 5:51:44 PM By: Mark [name] Mark [name], Mark [name] ([id number]) Entered By: Mark [name] on 02/05/2016 16:15:29 Mark [name] ([id number]) -------------------------------------------------------------------------------- HPI Details Mark [name] Date of Service: 02/05/2016 3:00 PM Patient Name: H. Patient Account Number: [id number] Medical Record Treating [name]: Mark Gouty [name], BSN, Mark [name] [id number] Number: Other Clinician: Date of Birth/Sex: 07-10-1940 (75 y.o. Male) Treating Mark [name] Primary Care Physician: Mark [name] Physician/Extender: G Referring Physician: [facility], [name] Weeks in Treatment: 3 History of Present Illness Location: Sacrum, dorsal right great toe, and  left great toe amputation site. Quality: Patient describes the pain as a soreness and sometimes burning sensation Severity: 4 out of 5 Duration: developed in the last 4 months due to patient having to sit through dialysis secondary to his kidney failing Timing: The patient describes the pain as being fairly persistent mainly in the sacral region although this is worse is if he is laying on it. Context: As a result of pressure in regard to the sacral ulcer. The lower extremity wounds of the toe her diabetic in nature though the left great toe amputation site is obviously a surgical wound. Modifying Factors: It appears that in the facility Santyl has been used on patient's wounds currently. Associated Signs and Symptoms: Patient has left great toe amputation, diabetes mellitus type 2, status post kidney transplant with subsequent kidney failure due to dye used for imaging studies, chronic atrial fibrillation, congestive heart failure HPI Description: 01/14/16 patient presents for initial evaluation concerning ulcers that he has over his sacral region which have developed more recently a result of him having to go back on  dialysis secondary to his transplanted kidney failing which apparently was due to dye given for an imaging study. With that being said he tells me that he tries as much as possible to offload one way or another during the time that he is having to sit for dialysis. However nonetheless he has developed the pressure ulcer to the sacral region. Subsequently he also has a diabetic foot ulcer to the dorsal surface of his right great toe to which Santyl has been used topically at this point in time. He also has a still open amputation site regard to his left great toe with necrotic tissue present. this amputation was performed on November 06, 2015 Dr. Oneida Alar. Due to osteomyelitis of the left first toe.This still has not achieved a good granulation bed. 01/21/16 patient was admitted to the clinic last week by Jeri Cos PA. He has a left first toe amputation site wound just distal to his MTP. A wound over the interphalangeal joint of his right toe and a pressure ulcer on his right buttock. During the recent hospitalization tells me he went into acute renal failure on a transplanted kidney and is now back on dialysis. He is leaving a nursing home today and going home to his wife. I don't see any specific vascular information at this point I'll need to [facility]. He apparently had osteomyelitis of the left first toe which was the reason for the amputation. The patient is a type II diabetic 01/29/16; the patient's buttock area on the right is totally healed. He has a difficult wound over the interphalangeal joint of his right great toe. Also a difficult wound on the left great toe amputation site. 02/05/16 patient continues to have a difficult wound on the left great toe amputation site and also the right toe amputation site. Culture I did of this area last week showed MRSA as well as enterococcus faecalis. I fax I started him on ampicillin 500 mg 3x per day and doxycycline 100 twice a  day. Unfortunately he only started this yesterday as I didn't taken to account that he is on hemodialysis. This would not affect the doxycycline but will drop Amoxil down to 500 once a day, after dialysis on dialysis days. I adjusted this today Mark [name], Mark [name] ([id number]) Electronic Signature(s) Signed: 02/05/2016 5:04:57 PM By: Mark [name] Entered By: Mark [name] on 02/05/2016 16:18:18 Mark [name], Mark [name] ([id number]) --------------------------------------------------------------------------------  Physical Exam Details Mark [name], Mark [name] Date of Service: 02/05/2016 3:00 PM Patient Name: H. Patient Account Number: [id number] Medical Record Treating [name]: Mark Gouty [name], BSN, Mark [name] [id number] Number: Other Clinician: Date of Birth/Sex: 21-Apr-1940 (75 y.o. Male) Treating Mark [name] Primary Care Physician: Mark [name] Physician/Extender: G Referring Physician: Edmonia [name] in Treatment: 3 Constitutional Sitting or standing Blood Pressure is within target range for patient.. Pulse regular and within target range for patient.Marland Kitchen Respirations regular, non-labored and within target range.. Temperature is normal and within the target range for the patient.. Patient's appearance is neat and clean. Appears in no acute distress. Well nourished and well developed.. Notes Wound exam; the area over his right buttock has resolved. The area over the left great toe amputation site is epithelialized and about 75% of its area however the remaining quarter has a deep probing wound. This is the reason I did a culture of this area last week. oOver the interphalangeal joint of his right great toe I am still able to debridement down to exposed bone over I managed to take the bone out of the wound itself to see if we can prompt some healing here Electronic Signature(s) Signed: 02/05/2016 5:04:57 PM By: Mark [name] Entered By: Mark [name] on 02/05/2016 16:20:26 Mark [name], Mark [name]  ([id number]) -------------------------------------------------------------------------------- Physician Orders Details Mark [name] Date of Service: 02/05/2016 3:00 PM Patient Name: H. Patient Account Number: [id number] Medical Record Treating [name]: Mark Gouty [name], BSN, Mark [name] [id number] Number: Other Clinician: Date of Birth/Sex: 08/27/1940 (75 y.o. Male) Treating Mark [name] Primary Care Physician: Mark [name] Physician/Extender: G Referring Physician: Edmonia [name] in Treatment: 3 Verbal / Phone Orders: Yes Clinician: Afful, [name], BSN, Rita Read Back and Verified: Yes Diagnosis Coding Wound Cleansing Wound #1 Left Toe Great o Clean wound with Normal Saline. o May shower with protection. Wound #2 Right,Dorsal Toe Great o Clean wound with Normal Saline. o May shower with protection. Anesthetic Wound #1 Left Toe Great o Topical Lidocaine 4% cream applied to wound bed prior to debridement Wound #2 Right,Dorsal Toe Great o Topical Lidocaine 4% cream applied to wound bed prior to debridement Skin Barriers/Peri-Wound Care Wound #1 Left Toe Great o Barrier cream Wound #2 Right,Dorsal Toe Great o Barrier cream Primary Wound Dressing Wound #1 Left Toe Great o Aquacel Ag - or equivalent Wound #2 Right,Dorsal Toe Great o Aquacel Ag - or equivaent Secondary Dressing Wound #1 Left Toe Great o Gauze and Kerlix/Conform Kravitz, Irby H. ([id number]) Wound #2 Right,Dorsal Toe Great o Gauze and Kerlix/Conform Dressing Change Frequency Wound #1 Left Toe Great o Change dressing every day. Wound #2 Right,Dorsal Toe Great o Change dressing every day. Follow-up Appointments Wound #1 Left Toe Great o Return Appointment in 1 week. Wound #2 Right,Dorsal Toe Great o Return Appointment in 1 week. Off-Loading o Turn and reposition every 2 hours o Mattress - Per Patient he is on an air mattress Additional Orders / Instructions Wound #1 Left Toe  Great o Increase protein intake. o Activity as tolerated o Other: - [employer], Vitamin A, C, [facility]VI Wound #2 Right,Dorsal Toe Great o Increase protein intake. o Activity as tolerated o Other: - [employer], Vitamin A, C, MVI Home Health Wound #1 Left Toe Great o Continue Home Health Visits - [facility] o Home Health Nurse may visit PRN to address patientos wound care needs. o FACE TO FACE ENCOUNTER: MEDICARE and MEDICAID PATIENTS: I certify that this patient is under my care and that I had a face-to-face encounter that meets the physician  face-to-face encounter requirements with this patient on this date. The encounter with the patient was in whole or in part for the following MEDICAL CONDITION: (primary reason for [place]) MEDICAL NECESSITY: I certify, that based on my findings, NURSING services are a medically necessary home health service. HOME BOUND STATUS: I certify that my clinical findings support that this patient is homebound (i.e., Due to illness or injury, pt requires aid of supportive devices such as crutches, cane, wheelchairs, walkers, the use of special transportation or the assistance of another person to leave their place of residence. There is a normal inability to leave the home and doing so requires considerable and taxing effort. Other Mark [name], Mark [name] ([id number]) absences are for medical reasons / religious services and are infrequent or of short duration when for other reasons). o If current dressing causes regression in wound condition, may D/C ordered dressing product/s and apply Normal Saline Moist Dressing daily until next [place] / Other [name] appointment. [place] of regression in wound condition at [phone]. o Please direct any NON-WOUND related issues/requests for orders to patient's Primary Care Physician Wound #2 Right,Dorsal Toe [place] Nurse  may visit PRN to address patientos wound care needs. o FACE TO FACE ENCOUNTER: MEDICARE and MEDICAID PATIENTS: I certify that this patient is under my care and that I had a face-to-face encounter that meets the physician face-to-face encounter requirements with this patient on this date. The encounter with the patient was in whole or in part for the following MEDICAL CONDITION: (primary reason for [place]) MEDICAL NECESSITY: I certify, that based on my findings, NURSING services are a medically necessary home health service. HOME BOUND STATUS: I certify that my clinical findings support that this patient is homebound (i.e., Due to illness or injury, pt requires aid of supportive devices such as crutches, cane, wheelchairs, walkers, the use of special transportation or the assistance of another person to leave their place of residence. There is a normal inability to leave the home and doing so requires considerable and taxing effort. Other absences are for medical reasons / religious services and are infrequent or of short duration when for other reasons). o If current dressing causes regression in wound condition, may D/C ordered dressing product/s and apply Normal Saline Moist Dressing daily until next [place] / Other [name] appointment. [place] of regression in wound condition at [phone]. o Please direct any NON-WOUND related issues/requests for orders to patient's Primary Care Physician Electronic Signature(s) Signed: 02/05/2016 5:04:57 PM By: Mark [name] Signed: 02/05/2016 5:51:44 PM By: Mark [name] Entered By: Mark [name] on 02/05/2016 15:52:45 Mark [name], Mark [name] ([id number]) -------------------------------------------------------------------------------- Problem List Details Mark [name] Date of Service: 02/05/2016 3:00 PM Patient Name: H. Patient Account Number: [id number] Medical Record Treating [name]: Mark Gouty [name], BSN,  Mark [name] [id number] Number: Other Clinician: Date of Birth/Sex: 1940/09/06 (75 y.o. 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, sequela 01/15/2016 Yes Z89.412 Acquired absence of left great toe 01/15/2016 Yes L97.512 Non-pressure chronic ulcer of other part of right  foot with 01/15/2016 Yes fat layer exposed L97.522 Non-pressure chronic ulcer of other part of left foot with fat 01/15/2016 Yes layer exposed Inactive Problems Resolved Problems Electronic Signature(s) Mark [name], Mark [name] ([id number]) Signed: 02/05/2016 5:04:57 PM By: Mark [name] Entered By: Mark [name] on 02/05/2016 16:14:32 Mark [name], Mark [name] ([id number]) -------------------------------------------------------------------------------- Progress Note Details Mark [name] Date of Service: 02/05/2016 3:00 PM Patient Name: H. Patient Account Number: [id number] Medical Record Treating [name]: Mark Gouty [name], BSN, Mark [name] [id number] Number: Other Clinician: Date of Birth/Sex: March 07, 1941 (75 y.o. Male) Treating Mark [name] Primary Care Physician: Mark [name] Physician/Extender: G Referring Physician: Edmonia [name] in Treatment: 3 Subjective Chief Complaint Information obtained from Patient Evaluation regarding patient's nonhealing left great toe amputation, right great toe ulcer, and sacral pressure ulcer History of Present Illness (HPI) The following HPI elements were documented for the patient's wound: Location: Sacrum, dorsal  right great toe, and left great toe amputation site. Quality: Patient describes the pain as a soreness and sometimes burning sensation Severity: 4 out of 5 Duration: developed in the last 4 months due to patient having to sit through dialysis secondary to his kidney failing Timing: The patient describes the pain as being fairly persistent mainly in the sacral region although this is worse is if he is laying on it. Context: As a result of pressure in regard to the sacral ulcer. The lower extremity wounds of the toe her diabetic in nature though the left great toe amputation site is obviously a surgical wound. Modifying Factors: It appears that in the facility Santyl has been used on patient's wounds currently. Associated Signs and Symptoms: Patient has left great toe amputation, diabetes mellitus type 2, status post kidney transplant with subsequent kidney failure due to dye used for imaging studies, chronic atrial fibrillation, congestive heart failure 01/14/16 patient presents for initial evaluation concerning ulcers that he has over his sacral region which have developed more recently a result of him having to go back on dialysis secondary to his transplanted kidney failing which apparently was due to dye given for an imaging study. With that being said he tells me that he tries as much as possible to offload one way or another during the time that he is having to sit for dialysis. However nonetheless he has developed the pressure ulcer to the sacral region. Subsequently he also has a diabetic foot ulcer to the dorsal surface of his right great toe to which Santyl has been used topically at this point in time. He also has a still open amputation site regard to his left great toe with necrotic tissue present. this amputation was performed on November 06, 2015 Dr. Oneida Alar. Due to osteomyelitis of the left first toe.This still has not achieved a good granulation bed. 01/21/16 patient was admitted to  the clinic last week by Jeri Cos PA. He has a left first toe amputation site wound just distal to his MTP. A wound over the interphalangeal joint of his right toe and a pressure ulcer on his right buttock. During the recent hospitalization tells me he went into acute renal failure on a transplanted kidney and is now back on dialysis. He is leaving a nursing home today and going home to his wife. I don't see any specific vascular information at this point I'll need to research [facility] this. He apparently had osteomyelitis of the left first toe which was the reason for the amputation. The patient is ARYAMAN, NIMZ ([id number]) a type II diabetic 01/29/16; the patient's  buttock area on the right is totally healed. He has a difficult wound over the interphalangeal joint of his right great toe. Also a difficult wound on the left great toe amputation site. 02/05/16 patient continues to have a difficult wound on the left great toe amputation site and also the right toe amputation site. Culture I did of this area last week showed MRSA as well as enterococcus faecalis. I fax I started him on ampicillin 500 mg 3x per day and doxycycline 100 twice a day. Unfortunately he only started this yesterday as I didn't taken to account that he is on hemodialysis. This would not affect the doxycycline but will drop Amoxil down to 500 once a day, after dialysis on dialysis days. I adjusted this today Objective Constitutional Sitting or standing Blood Pressure is within target range for patient.. Pulse regular and within target range for patient.Marland Kitchen Respirations regular, non-labored and within target range.. Temperature is normal and within the target range for the patient.. Patient's appearance is neat and clean. Appears in no acute distress. Well nourished and well developed.. Vitals Time Taken: 3:27 PM, Height: 67 in, Weight: 190 lbs, BMI: 29.8, Temperature: 97.6 F, Pulse: 90 bpm, Respiratory  Rate: 17 breaths/min, Blood Pressure: 109/65 mmHg. General Notes: Wound exam; the area over his right buttock has resolved. The area over the left great toe amputation site is epithelialized and about 75% of its area however the remaining quarter has a deep probing wound. This is the reason I did a culture of this area last week. Over the interphalangeal joint of his right great toe I am still able to debridement down to exposed bone over I managed to take the bone out of the wound itself to see if we can prompt some healing here Integumentary (Hair, Skin) Wound #1 status is Open. Original cause of wound was Surgical Injury. The wound is located on the Left Toe Great. The wound measures 1cm length x 1cm width x 0.5cm depth; 0.785cm^2 area and 0.393cm^3 volume. The wound is limited to skin breakdown. There is no tunneling or undermining noted. There is a large amount of serosanguineous drainage noted. The wound margin is distinct with the outline attached to the wound base. There is small (1-33%) granulation within the wound bed. There is a large (67-100%) amount of necrotic tissue within the wound bed including Adherent Slough. The periwound skin appearance exhibited: Moist. The periwound skin appearance did not exhibit: Callus, Crepitus, Excoriation, Fluctuance, Friable, Induration, Localized Edema, Rash, Scarring, Dry/Scaly, Maceration, Atrophie Blanche, Cyanosis, Ecchymosis, Hemosiderin Staining, Mottled, Pallor, Rubor, Erythema. Periwound temperature was noted as No Abnormality. The periwound has tenderness on palpation. Wound #2 status is Open. Original cause of wound was Gradually Appeared. The wound is located on the Right,Dorsal [employer] The wound measures 0.5cm length x 0.5cm width x 0.2cm depth; 0.196cm^2 area and 0.039cm^3 volume. The wound is limited to skin breakdown. There is no tunneling or undermining noted. There is a medium amount of serosanguineous drainage noted. The wound  margin is distinct with Schou, Acheron H. ([id number]) the outline attached to the wound base. There is no granulation within the wound bed. There is a large (67- 100%) amount of necrotic tissue within the wound bed including Adherent Slough. The periwound skin appearance exhibited: Moist. The periwound skin appearance did not exhibit: Callus, Crepitus, Excoriation, Fluctuance, Friable, Induration, Localized Edema, Rash, Scarring, Dry/Scaly, Maceration, Atrophie Blanche, Cyanosis, Ecchymosis, Hemosiderin Staining, Mottled, Pallor, Rubor, Erythema. Periwound temperature was noted as No Abnormality. 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 There was a Skin/Subcutaneous Tissue Debridement [id number] debridement with total area of 1 sq cm performed by Mark Dillon, [name]. with the following instrument(s): Curette to remove Non-Viable tissue/material including Fibrin/Slough and Subcutaneous after achieving pain control using Lidocaine 4% Topical Solution. A time out was conducted at 15:47, prior to the start of the procedure. A Minimum amount of bleeding was controlled with Pressure. The procedure was tolerated well with a pain level of 0 throughout and a pain level of 0 following the procedure. Post Debridement Measurements: 1cm length x 1cm width x 0.5cm depth; 0.393cm^3 volume. Character of Wound/Ulcer  Post Debridement requires further debridement. Severity of Tissue Post Debridement is: Fat layer exposed. Post procedure Diagnosis Wound #1: Same as Pre-Procedure Wound #2 Wound #2 is a Diabetic Wound/Ulcer of the Lower Extremity located on the Right,Dorsal Toe Great . There was a Skin/Subcutaneous Tissue Debridement [id number]) debridement with total area of 0.25 sq cm performed by Mark Dillon, [name]. with the following instrument(s): Curette to remove Non-Viable tissue/material including Bone, Fibrin/Slough, and Subcutaneous after achieving pain control using Cade, Vishnu H. ([id number]) Lidocaine 4% Topical Solution. A time out was conducted at 15:45, prior to the start of the procedure. A Minimum amount of bleeding was controlled with Pressure. The procedure was tolerated well with a pain level of 0 throughout and a pain level of 0 following the procedure. Post Debridement Measurements: 0.5cm length x 0.5cm width x 0.2cm depth; 0.039cm^3 volume. Character of Wound/Ulcer Post Debridement requires further debridement. Severity of Tissue Post Debridement is: Fat layer exposed. Post procedure Diagnosis Wound #2: Same as Pre-Procedure Plan Wound Cleansing: Wound #1 Left Toe Great: Clean wound with Normal Saline. May shower with protection. Wound #2 Right,Dorsal Toe Great: Clean wound with Normal Saline. May shower with protection. Anesthetic: Wound #1 Left Toe Great: Topical Lidocaine 4% cream applied to wound bed prior to debridement Wound #2 Right,Dorsal Toe Great: Topical Lidocaine 4% cream applied to wound bed prior to debridement Skin Barriers/Peri-Wound Care: Wound #1 Left Toe Great: Barrier cream Wound #2 Right,Dorsal Toe Great: Barrier cream Primary Wound Dressing: Wound #1 Left Toe Great: Aquacel Ag - or equivalent Wound #2 Right,Dorsal Toe Great: Aquacel Ag - or equivaent Secondary Dressing: Wound #1 Left Toe Great: Gauze and Kerlix/Conform Wound #2  Right,Dorsal Toe Great: Gauze and Kerlix/Conform Dressing Change Frequency: Wound #1 Left Toe Great: Change dressing every day. Wound #2 Right,Dorsal Toe Great: Change dressing every day. Follow-up Appointments: Wound #1 Left Toe Great: Return Appointment in 1 week. LEQUAN, VILLARROEL ([id number]) Wound #2 Right,Dorsal Toe Great: Return Appointment in 1 week. Off-Loading: Turn and reposition every 2 hours Mattress - Per Patient he is on an air mattress Additional Orders / Instructions: Wound #1 Left Toe Great: Increase protein intake. Activity as tolerated Other: - [employer]Inc, Vitamin A, C, MVI Wound #2 Right,Dorsal Toe Great: Increase protein intake. Activity as tolerated Other: - [employer]Inc,  Vitamin A, C, MVI Home Health: Wound #1 Left Toe Great: [place] Nurse may visit PRN to address patient s wound care needs. FACE TO FACE ENCOUNTER: MEDICARE and MEDICAID PATIENTS: I certify that this patient is under my care and that I had a face-to-face encounter that meets the physician face-to-face encounter requirements with this patient on this date. The encounter with the patient was in whole or in part for the following MEDICAL CONDITION: (primary reason for [place]) MEDICAL NECESSITY: I certify, that based on my findings, NURSING services are a medically necessary home health service. HOME BOUND STATUS: I certify that my clinical findings support that this patient is homebound (i.e., Due to illness or injury, pt requires aid of supportive devices such as crutches, cane, wheelchairs, walkers, the use of special transportation or the assistance of another person to leave their place of residence. There is a normal inability to leave the home and doing so requires considerable and taxing effort. Other absences are for medical reasons / religious services and are infrequent or of short duration when for other reasons). If current dressing causes  regression in wound condition, may D/C ordered dressing product/s and apply Normal Saline Moist Dressing daily until next [place] / Other [name] appointment. [place] of regression in wound condition at [phone]. Please direct any NON-WOUND related issues/requests for orders to patient's Primary Care Physician Wound #2 Right,Dorsal Toe Great: [place] Nurse may visit PRN to address patient s wound care needs. FACE TO FACE ENCOUNTER: MEDICARE and MEDICAID PATIENTS: I certify that this patient is under my care and that I had a face-to-face encounter that meets the physician face-to-face encounter requirements with this patient on this date. The encounter with the patient was in whole or in part for the following MEDICAL CONDITION: (primary reason for [place]) MEDICAL NECESSITY: I certify, that based on my findings, NURSING services are a medically necessary home health service. HOME BOUND STATUS: I certify that my clinical findings support that this patient is homebound (i.e., Due to illness or injury, pt requires aid of supportive devices such as crutches, cane, wheelchairs, walkers, the use of special transportation or the assistance of another person to leave their place of residence. There is a normal inability to leave the home and doing so requires considerable and taxing effort. Other absences are for medical reasons / religious services and are infrequent or of short duration when for other reasons). If current dressing causes regression in wound condition, may D/C ordered dressing product/s and apply Normal Saline Moist Dressing daily until next [place] / Other [name] appointment. [place] of regression in wound condition at [phone]. Please direct any NON-WOUND related issues/requests for orders to patient's Primary Care Physician Mark [name], Mark [name] ([id number]) #1 I've  change the dressing in both wound areas to silver alginate will be able to pack this in to the left amputation site. #2 unfortunately I was able to catch the amoxicillin air in dosing on somebody on renal dialysis. He is now down to once a day with the dialysis days being after dialysis #3 ordered x-rays of his feet last week I don't see that these were done need to check on this next week Electronic Signature(s) Signed: 02/05/2016 5:04:57 PM By: Mark [name] Entered By: Mark [name] on 02/05/2016 16:21:26 Mark [name], Mark [name] ([id number]) -------------------------------------------------------------------------------- SuperBill Details Mark [name] Date of Service: 02/05/2016  Patient Name: H. Patient Account Number: [id number] Medical Record Treating [name]: Mark Gouty [name], BSN, Mark [name] [id number] Number: Other Clinician: Date of Birth/Sex: 03/30/1941 (75 y.o. 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 L97.522 Non-pressure chronic ulcer of other part of left foot with fat layer exposed Facility Procedures CPT4 Code: [id number] Description: [id number] - DEB BONE 20 SQ CM/< ICD-10 Description Diagnosis E11.621 Type 2 diabetes mellitus with foot ulcer Modifier: Quantity: 1 Physician Procedures CPT4: Description Modifier Quantity Code [id number] Debridement; bone (includes epidermis, dermis, subQ tissue, muscle 1 and/or fascia, if performed) 1st 20 sqcm or less ICD-10  Description Diagnosis E11.621 Type 2 diabetes mellitus with foot ulcer Electronic Signature(s) Signed: 02/05/2016 5:04:57 PM By: Mark [name] Entered By: Mark [name] on 02/05/2016 16:22:21

## 2016-02-06 NOTE — Progress Notes (Addendum)
CALLEN, RAWLING ([id number]) Visit Report for 02/05/2016 Arrival Information Details Patient Name: FAYE, DOAR. Date of Service: 02/05/2016 3:00 PM Medical Record Number: [id number] Patient Account Number: [id number] Date of Birth/Sex: [birth year] (75 y.o. Male) Treating RN: Baruch Gouty, RN, BSN, Velva Harman Primary Care Physician: COX, KIRSTEN Other Clinician: Referring Physician: COX, KIRSTEN Treating Physician/Extender: Tito Dine in Treatment: 3 Visit Information History Since Last Visit All ordered tests and consults were completed: No Patient Arrived: Wheel Chair Added or deleted any medications: No Arrival Time: 15:24 Any new allergies or adverse reactions: No Accompanied By: dtr Had a fall or experienced change in No Transfer Assistance: [employer] activities of daily living that may affect Patient Lift risk of falls: Patient Identification Verified: Yes Signs or symptoms of abuse/neglect since last No Secondary Verification Process Yes visito Completed: Hospitalized since last visit: No Patient Requires Transmission- No Has Dressing in Place as Prescribed: Yes Based Precautions: Has Footwear/Offloading in Place as Yes Patient Has Alerts: No Prescribed: Left: Wedge Shoe Pain Present Now: No Electronic Signature(s) Signed: 02/05/2016 5:51:44 PM By: Regan Lemming BSN, RN Entered By: Regan Lemming on 02/05/2016 15:26:24 [name] ([id number]) -------------------------------------------------------------------------------- Encounter Discharge Information Details Patient Name: [name]. Date of Service: 02/05/2016 3:00 PM Medical Record Number: [id number] Patient Account Number: [id number] Date of Birth/Sex: [birth year] (75 y.o. Male) Treating RN: Baruch Gouty, RN, BSN, Velva Harman Primary Care Physician: COX, KIRSTEN Other Clinician: Referring Physician: COX, KIRSTEN Treating Physician/Extender: Tito Dine in Treatment: 3 Encounter Discharge  Information Items Discharge Pain Level: 0 Discharge Condition: Stable Ambulatory Status: Wheelchair Discharge Destination: Home Transportation: Private Auto Accompanied By: dtr Schedule Follow-up Appointment: No Medication Reconciliation completed and provided to Patient/Care No Berle Fitz: Provided on Clinical Summary of Care: 02/05/2016 Form Type Recipient Paper Patient [facility] Electronic Signature(s) Signed: 02/05/2016 5:50:40 PM By: Regan Lemming BSN, RN Previous Signature: 02/05/2016 4:02:43 PM Version By: Ruthine Dose Entered By: Regan Lemming on 02/05/2016 17:50:40 [name] ([id number]) -------------------------------------------------------------------------------- Lower Extremity Assessment Details Patient Name: [name]. Date of Service: 02/05/2016 3:00 PM Medical Record Number: [id number] Patient Account Number: [id number] Date of Birth/Sex: 10-29-1940 (75 y.o. Male) Treating RN: Baruch Gouty, RN, BSN, Velva Harman Primary Care Physician: COX, KIRSTEN Other Clinician: Referring Physician: COX, KIRSTEN Treating Physician/Extender: Ricard Dillon Weeks in Treatment: 3 Vascular Assessment Pulses: Posterior Tibial Dorsalis Pedis Palpable: [Left:Yes] [Right:Yes] Extremity colors, hair growth, and conditions: Extremity Color: [Left:Mottled] [Right:Mottled] Hair Growth on Extremity: [Left:No] [Right:No] Temperature of Extremity: [Left:Warm] [Right:Warm] Capillary Refill: [Left:< 3 seconds] [Right:< 3 seconds] Toe Nail Assessment Left: Right: Thick: Yes Yes Discolored: No No Deformed: No No Improper Length and Hygiene: Yes Yes Electronic Signature(s) Signed: 02/05/2016 5:51:44 PM By: Regan Lemming BSN, RN Entered By: Regan Lemming on 02/05/2016 15:28:47 [name] ([id number]) -------------------------------------------------------------------------------- Multi Wound Chart Details Patient Name: [name]. Date of Service: 02/05/2016 3:00 PM Medical  Record Number: [id number] Patient Account Number: [id number] Date of Birth/Sex: [birth year] (75 y.o. Male) Treating RN: Baruch Gouty, RN, BSN, Velva Harman Primary Care Physician: COX, KIRSTEN Other Clinician: Referring Physician: COX, KIRSTEN Treating Physician/Extender: Ricard Dillon Weeks in Treatment: 3 Vital Signs Height(in): 67 Pulse(bpm): 90 Weight(lbs): 190 Blood Pressure 109/65 (mmHg): Body Mass Index(BMI): 30 Temperature(F): 97.6 Respiratory Rate 17 (breaths/min): Photos: [1:No Photos] [2:No Photos] [N/A:N/A] Wound Location: [1:Left Toe Great] [2:Right Toe Great - Dorsal N/A] Wounding Event: [1:Surgical Injury] [2:Gradually Appeared] [N/A:N/A] Primary Etiology: [1:Diabetic Wound/Ulcer of Diabetic Wound/Ulcer of N/A the Lower Extremity] [2:the Lower Extremity] Comorbid History: [1:Chronic Obstructive Pulmonary Disease (  COPD), Arrhythmia, Hypertension, Myocardial Hypertension, Myocardial Infarction, Type II Diabetes, End Stage Renal Disease, Gout, Osteoarthritis, Neuropathy Osteoarthritis, Neuropathy]  [2:Chronic Obstructive Pulmonary Disease (COPD), Arrhythmia, Infarction, Type II Diabetes, End Stage Renal Disease, Gout,] [N/A:N/A] Date Acquired: [1:10/16/2015] [2:12/10/2015] [N/A:N/A] Weeks of Treatment: [1:3] [2:3] [N/A:N/A] Wound Status: [1:Open] [2:Open] [N/A:N/A] Measurements L x W x D 1x1x0.5 [2:0.5x0.5x0.2] [N/A:N/A] (cm) Area (cm) : [1:0.785] [2:0.196] [N/A:N/A] Volume (cm) : [1:0.393] [2:0.039] [N/A:N/A] % Reduction in Area: [1:81.90%] [2:55.50%] [N/A:N/A] % Reduction in Volume: 69.80% [2:11.40%] [N/A:N/A] Classification: [1:Grade 1] [2:Grade 1] [N/A:N/A] Exudate Amount: [1:Large] [2:Medium] [N/A:N/A] Exudate Type: [1:Serosanguineous] [2:Serosanguineous] [N/A:N/A] Exudate Color: [1:red, brown] [2:red, brown] [N/A:N/A] Wound Margin: [1:Distinct, outline attached Distinct, outline attached N/A] Granulation Amount: [1:Small (1-33%)] [2:None Present (0%)] [N/A:N/A] Necrotic  Amount: [1:Large (67-100%)] [2:Large (67-100%)] [N/A:N/A] Exposed Structures: [N/A:N/A] Fascia: No Fascia: No Fat: No Fat: No Tendon: No Tendon: No Muscle: No Muscle: No Joint: No Joint: No Bone: No Bone: No Limited to Skin Limited to Skin Breakdown Breakdown Epithelialization: None None N/A Periwound Skin Texture: Edema: No Edema: No N/A Excoriation: No Excoriation: No Induration: No Induration: No Callus: No Callus: No Crepitus: No Crepitus: No Fluctuance: No Fluctuance: No Friable: No Friable: No Rash: No Rash: No Scarring: No Scarring: No Periwound Skin Moist: Yes Moist: Yes N/A Moisture: Maceration: No Maceration: No Dry/Scaly: No Dry/Scaly: No Periwound Skin Color: Atrophie Blanche: No Atrophie Blanche: No N/A Cyanosis: No Cyanosis: No Ecchymosis: No Ecchymosis: No Erythema: No Erythema: No Hemosiderin Staining: No Hemosiderin Staining: No Mottled: No Mottled: No Pallor: No Pallor: No Rubor: No Rubor: No Temperature: No Abnormality No Abnormality N/A Tenderness on Yes No N/A Palpation: Wound Preparation: Ulcer Cleansing: Ulcer Cleansing: N/A Rinsed/Irrigated with Rinsed/Irrigated with Saline Saline Topical Anesthetic Topical Anesthetic Applied: Other: lidocaine Applied: Other: lidocaine 4% 4% Treatment Notes Electronic Signature(s) Signed: 02/05/2016 5:51:44 PM By: Regan Lemming BSN, RN Entered By: Regan Lemming on 02/05/2016 15:45:16 NAASON, CUFFEE ([id number]) -------------------------------------------------------------------------------- [place] Details Patient Name: [name]. Date of Service: 02/05/2016 3:00 PM Medical Record Number: [id number] Patient Account Number: [id number] Date of Birth/Sex: 20-Feb-1941 (75 y.o. Male) Treating RN: Baruch Gouty, RN, BSN, Velva Harman Primary Care Physician: Rochel Brome Other Clinician: Referring Physician: Rochel Brome Treating Physician/Extender: Tito Dine in  Treatment: 3 Active Inactive Electronic Signature(s) Signed: 03/03/2016 12:00:52 PM By: Gretta Cool RN, BSN, Kim RN, BSN Signed: 03/11/2016 4:48:45 PM By: Regan Lemming BSN, RN Previous Signature: 02/05/2016 5:51:44 PM Version By: Regan Lemming BSN, RN Entered By: Gretta Cool, RN, BSN, Kim on 03/03/2016 12:00:51 JOSEY, SCHOFF ([id number]) -------------------------------------------------------------------------------- Pain Assessment Details Patient Name: EDDISON, INIGUEZ. Date of Service: 02/05/2016 3:00 PM Medical Record Number: [id number] Patient Account Number: [id number] Date of Birth/Sex: 1940/07/03 (75 y.o. Male) Treating RN: Baruch Gouty, RN, BSN, Velva Harman Primary Care Physician: Rochel Brome Other Clinician: Referring Physician: COX, KIRSTEN Treating Physician/Extender: Ricard Dillon Weeks in Treatment: 3 Active Problems Location of Pain Severity and Description of Pain Patient Has Paino No Site Locations With Dressing Change: No Pain Management and Medication Current Pain Management: Electronic Signature(s) Signed: 02/05/2016 5:51:44 PM By: Regan Lemming BSN, RN Entered By: Regan Lemming on 02/05/2016 15:26:30 [name] ([id number]) -------------------------------------------------------------------------------- Patient/Caregiver Education Details Patient Name: [name]. Date of Service: 02/05/2016 3:00 PM Medical Record Number: [id number] Patient Account Number: [id number] Date of Birth/Gender: 19-May-1940 (75 y.o. Male) Treating RN: Baruch Gouty, RN, BSN, Velva Harman Primary Care Physician: COX, KIRSTEN Other Clinician: Referring Physician: COX, KIRSTEN Treating Physician/Extender: Tito Dine in Treatment: 3 Education  Assessment Education Provided To: Patient Education Topics Provided Venous: Methods: Explain/Verbal Responses: State content correctly Welcome To [facility] [place]: Methods: Explain/Verbal Responses: State content correctly Wound/Skin  Impairment: Methods: Explain/Verbal Responses: State content correctly Electronic Signature(s) Signed: 02/05/2016 5:51:44 PM By: Regan Lemming BSN, RN Entered By: Regan Lemming on 02/05/2016 17:50:57 [name] ([id number]) -------------------------------------------------------------------------------- Wound Assessment Details Patient Name: [name] H. Date of Service: 02/05/2016 3:00 PM Medical Record Number: [id number] Patient Account Number: [id number] Date of Birth/Sex: 1940-09-22 (75 y.o. Male) Treating RN: Baruch Gouty, RN, BSN, [place] Primary Care Physician: COX, KIRSTEN Other Clinician: Referring Physician: COX, KIRSTEN Treating Physician/Extender: Ricard Dillon Weeks in Treatment: 3 Wound Status Wound Number: 1 Primary Diabetic Wound/Ulcer of the Lower Etiology: Extremity Wound Location: Left Toe Great Wound Open Wounding Event: Surgical Injury Status: Date Acquired: 10/16/2015 Comorbid Chronic Obstructive Pulmonary Disease Weeks Of Treatment: 3 History: (COPD), Arrhythmia, Hypertension, Clustered Wound: No Myocardial Infarction, Type II Diabetes, End Stage Renal Disease, Gout, Osteoarthritis, Neuropathy Wound Measurements Length: (cm) 1 Width: (cm) 1 Depth: (cm) 0.5 Area: (cm) 0.785 Volume: (cm) 0.393 % Reduction in Area: 81.9% % Reduction in Volume: 69.8% Epithelialization: None Tunneling: No Undermining: No Wound Description Classification: Grade 1 Wound Margin: Distinct, outline attached Exudate Amount: Large Exudate Type: Serosanguineous Exudate Color: red, brown Foul Odor After Cleansing: No Wound Bed Granulation Amount: Small (1-33%) Exposed Structure Necrotic Amount: Large (67-100%) Fascia Exposed: No Necrotic Quality: Adherent Slough Fat Layer Exposed: No Tendon Exposed: No Muscle Exposed: No Joint Exposed: No Bone Exposed: No Limited to Skin Breakdown Periwound Skin Texture Texture Color No Abnormalities Noted: No No  Abnormalities Noted: No Callus: No Atrophie Blanche: No JAYDN, SMELLEY H. ([id number]) Crepitus: No Cyanosis: No Excoriation: No Ecchymosis: No Fluctuance: No Erythema: No Friable: No Hemosiderin Staining: No Induration: No Mottled: No Localized Edema: No Pallor: No Rash: No Rubor: No Scarring: No Temperature / Pain Moisture Temperature: No Abnormality No Abnormalities Noted: No Tenderness on Palpation: Yes Dry / Scaly: No Maceration: No Moist: Yes Wound Preparation Ulcer Cleansing: Rinsed/Irrigated with Saline Topical Anesthetic Applied: Other: lidocaine 4%, Electronic Signature(s) Signed: 02/05/2016 5:51:44 PM By: Regan Lemming BSN, RN Entered By: Regan Lemming on 02/05/2016 15:33:29 XIANG, REUM ([id number]) -------------------------------------------------------------------------------- Wound Assessment Details Patient Name: [name] H. Date of Service: 02/05/2016 3:00 PM Medical Record Number: [id number] Patient Account Number: [id number] Date of Birth/Sex: November 21, 1940 (75 y.o. Male) Treating RN: Baruch Gouty, RN, BSN, [place] Primary Care Physician: COX, KIRSTEN Other Clinician: Referring Physician: COX, KIRSTEN Treating Physician/Extender: Ricard Dillon Weeks in Treatment: 3 Wound Status Wound Number: 2 Primary Diabetic Wound/Ulcer of the Lower Etiology: Extremity Wound Location: Right Toe Great - Dorsal Wound Open Wounding Event: Gradually Appeared Status: Date Acquired: 12/10/2015 Comorbid Chronic Obstructive Pulmonary Disease Weeks Of Treatment: 3 History: (COPD), Arrhythmia, Hypertension, Clustered Wound: No Myocardial Infarction, Type II Diabetes, End Stage Renal Disease, Gout, Osteoarthritis, Neuropathy Wound Measurements Length: (cm) 0.5 Width: (cm) 0.5 Depth: (cm) 0.2 Area: (cm) 0.196 Volume: (cm) 0.039 % Reduction in Area: 55.5% % Reduction in Volume: 11.4% Epithelialization: None Tunneling: No Undermining: No Wound  Description Classification: Grade 1 Wound Margin: Distinct, outline attached Exudate Amount: Medium Exudate Type: Serosanguineous Exudate Color: red, brown Foul Odor After Cleansing: No Wound Bed Granulation Amount: None Present (0%) Exposed Structure Necrotic Amount: Large (67-100%) Fascia Exposed: No Necrotic Quality: Adherent Slough Fat Layer Exposed: No Tendon Exposed: No Muscle Exposed: No Joint Exposed: No Bone Exposed: No Limited to Skin Breakdown Periwound Skin Texture Texture Color No Abnormalities  Noted: No No Abnormalities Noted: No Callus: No Atrophie Blanche: No GIFFORD, ENGE. ([id number]) Crepitus: No Cyanosis: No Excoriation: No Ecchymosis: No Fluctuance: No Erythema: No Friable: No Hemosiderin Staining: No Induration: No Mottled: No Localized Edema: No Pallor: No Rash: No Rubor: No Scarring: No Temperature / Pain Moisture Temperature: No Abnormality No Abnormalities Noted: No Dry / Scaly: No Maceration: No Moist: Yes Wound Preparation Ulcer Cleansing: Rinsed/Irrigated with Saline Topical Anesthetic Applied: Other: lidocaine 4%, Electronic Signature(s) Signed: 02/05/2016 5:51:44 PM By: Regan Lemming BSN, RN Entered By: Regan Lemming on 02/05/2016 15:33:42 XERXES, NASON ([id number]) -------------------------------------------------------------------------------- Vitals Details Patient Name: [name]. Date of Service: 02/05/2016 3:00 PM Medical Record Number: [id number] Patient Account Number: [id number] Date of Birth/Sex: 02-03-1941 (75 y.o. Male) Treating RN: Baruch Gouty, RN, BSN, [place] Primary Care Physician: COX, KIRSTEN Other Clinician: Referring Physician: COX, KIRSTEN Treating Physician/Extender: Ricard Dillon Weeks in Treatment: 3 Vital Signs Time Taken: 15:27 Temperature (F): 97.6 Height (in): 67 Pulse (bpm): 90 Weight (lbs): 190 Respiratory Rate (breaths/min): 17 Body Mass Index (BMI): 29.8 Blood Pressure  (mmHg): 109/65 Reference Range: 80 - 120 mg / dl Electronic Signature(s) Signed: 02/05/2016 5:51:44 PM By: Regan Lemming BSN, RN Entered By: Regan Lemming on 02/05/2016 15:28:08

## 2016-02-07 ENCOUNTER — Telehealth: Payer: Self-pay | Admitting: Endocrinology

## 2016-02-07 DIAGNOSIS — E1122 Type 2 diabetes mellitus with diabetic chronic kidney disease: Secondary | ICD-10-CM | POA: Diagnosis not present

## 2016-02-07 DIAGNOSIS — I132 Hypertensive heart and chronic kidney disease with heart failure and with stage 5 chronic kidney disease, or end stage renal disease: Secondary | ICD-10-CM | POA: Diagnosis not present

## 2016-02-07 DIAGNOSIS — L89312 Pressure ulcer of right buttock, stage 2: Secondary | ICD-10-CM | POA: Diagnosis not present

## 2016-02-07 DIAGNOSIS — E11621 Type 2 diabetes mellitus with foot ulcer: Secondary | ICD-10-CM | POA: Diagnosis not present

## 2016-02-07 DIAGNOSIS — I482 Chronic atrial fibrillation: Secondary | ICD-10-CM | POA: Diagnosis not present

## 2016-02-07 DIAGNOSIS — L97522 Non-pressure chronic ulcer of other part of left foot with fat layer exposed: Secondary | ICD-10-CM | POA: Diagnosis not present

## 2016-02-07 NOTE — Telephone Encounter (Signed)
I contacted the patient and he stated he is not taking any insulin and has not been for 5 days because he scared he will not take the right amount. Patient scheduled his appointment for 02/10/2016.

## 2016-02-07 NOTE — Telephone Encounter (Signed)
Need to know what insulin he is taking.  Also needs to be seen as soon as possible as he is overdue for visit

## 2016-02-07 NOTE — Telephone Encounter (Signed)
If he has the Humalog at home than he can take 10 units before each meal and also can start on the [employer] brand Novolin N insulin 15 units at breakfast and bedtime unless he has some Lantus

## 2016-02-07 NOTE — Telephone Encounter (Signed)
Pt is losing a lot of weight and feels like he needs to know what to do about the insulin dosing with losing weight.  The pump is off right now  His BS is ranging in the 200s has been in the hospital and nursing home for about a month

## 2016-02-07 NOTE — Telephone Encounter (Signed)
Patient was called: He thinks he may have the Novolin R at home.  He can start with at least 6 units before each meal and workup to 10 units if eating a full meal

## 2016-02-08 DIAGNOSIS — D649 Anemia, unspecified: Secondary | ICD-10-CM | POA: Diagnosis not present

## 2016-02-08 DIAGNOSIS — N2581 Secondary hyperparathyroidism of renal origin: Secondary | ICD-10-CM | POA: Diagnosis not present

## 2016-02-08 DIAGNOSIS — N170 Acute kidney failure with tubular necrosis: Secondary | ICD-10-CM | POA: Diagnosis not present

## 2016-02-08 DIAGNOSIS — D509 Iron deficiency anemia, unspecified: Secondary | ICD-10-CM | POA: Diagnosis not present

## 2016-02-10 ENCOUNTER — Telehealth: Payer: Self-pay | Admitting: Endocrinology

## 2016-02-10 ENCOUNTER — Ambulatory Visit: Payer: Medicare Other | Admitting: Endocrinology

## 2016-02-10 DIAGNOSIS — E1122 Type 2 diabetes mellitus with diabetic chronic kidney disease: Secondary | ICD-10-CM | POA: Diagnosis not present

## 2016-02-10 DIAGNOSIS — L97522 Non-pressure chronic ulcer of other part of left foot with fat layer exposed: Secondary | ICD-10-CM | POA: Diagnosis not present

## 2016-02-10 DIAGNOSIS — E11621 Type 2 diabetes mellitus with foot ulcer: Secondary | ICD-10-CM | POA: Diagnosis not present

## 2016-02-10 DIAGNOSIS — I132 Hypertensive heart and chronic kidney disease with heart failure and with stage 5 chronic kidney disease, or end stage renal disease: Secondary | ICD-10-CM | POA: Diagnosis not present

## 2016-02-10 DIAGNOSIS — L89312 Pressure ulcer of right buttock, stage 2: Secondary | ICD-10-CM | POA: Diagnosis not present

## 2016-02-10 DIAGNOSIS — I482 Chronic atrial fibrillation: Secondary | ICD-10-CM | POA: Diagnosis not present

## 2016-02-11 DIAGNOSIS — N2581 Secondary hyperparathyroidism of renal origin: Secondary | ICD-10-CM | POA: Diagnosis not present

## 2016-02-11 DIAGNOSIS — D509 Iron deficiency anemia, unspecified: Secondary | ICD-10-CM | POA: Diagnosis not present

## 2016-02-11 DIAGNOSIS — I482 Chronic atrial fibrillation: Secondary | ICD-10-CM | POA: Diagnosis not present

## 2016-02-11 DIAGNOSIS — D649 Anemia, unspecified: Secondary | ICD-10-CM | POA: Diagnosis not present

## 2016-02-11 DIAGNOSIS — N170 Acute kidney failure with tubular necrosis: Secondary | ICD-10-CM | POA: Diagnosis not present

## 2016-02-12 ENCOUNTER — Emergency Department (HOSPITAL_COMMUNITY): Payer: Medicare Other

## 2016-02-12 ENCOUNTER — Other Ambulatory Visit: Payer: Self-pay

## 2016-02-12 ENCOUNTER — Encounter (HOSPITAL_COMMUNITY): Payer: Self-pay

## 2016-02-12 ENCOUNTER — Ambulatory Visit: Payer: Medicare Other | Admitting: Nurse Practitioner

## 2016-02-12 ENCOUNTER — Inpatient Hospital Stay (HOSPITAL_COMMUNITY)
Admission: EM | Admit: 2016-02-12 | Discharge: 2016-02-17 | DRG: 438 | Disposition: A | Discharge status: Skilled Nursing Facility | Payer: Medicare Other | Attending: Family Medicine | Admitting: Family Medicine

## 2016-02-12 ENCOUNTER — Encounter: Payer: Self-pay | Admitting: Family

## 2016-02-12 DIAGNOSIS — M109 Gout, unspecified: Secondary | ICD-10-CM | POA: Diagnosis present

## 2016-02-12 DIAGNOSIS — N186 End stage renal disease: Secondary | ICD-10-CM | POA: Diagnosis not present

## 2016-02-12 DIAGNOSIS — Z992 Dependence on renal dialysis: Secondary | ICD-10-CM | POA: Diagnosis not present

## 2016-02-12 DIAGNOSIS — E876 Hypokalemia: Secondary | ICD-10-CM | POA: Diagnosis not present

## 2016-02-12 DIAGNOSIS — R1310 Dysphagia, unspecified: Secondary | ICD-10-CM | POA: Diagnosis not present

## 2016-02-12 DIAGNOSIS — E039 Hypothyroidism, unspecified: Secondary | ICD-10-CM | POA: Diagnosis present

## 2016-02-12 DIAGNOSIS — R41841 Cognitive communication deficit: Secondary | ICD-10-CM | POA: Diagnosis not present

## 2016-02-12 DIAGNOSIS — Z79899 Other long term (current) drug therapy: Secondary | ICD-10-CM

## 2016-02-12 DIAGNOSIS — Z940 Kidney transplant status: Secondary | ICD-10-CM | POA: Diagnosis not present

## 2016-02-12 DIAGNOSIS — Z66 Do not resuscitate: Secondary | ICD-10-CM | POA: Diagnosis present

## 2016-02-12 DIAGNOSIS — Z6824 Body mass index (BMI) 24.0-24.9, adult: Secondary | ICD-10-CM

## 2016-02-12 DIAGNOSIS — Z9849 Cataract extraction status, unspecified eye: Secondary | ICD-10-CM | POA: Diagnosis not present

## 2016-02-12 DIAGNOSIS — R1084 Generalized abdominal pain: Secondary | ICD-10-CM | POA: Diagnosis not present

## 2016-02-12 DIAGNOSIS — I509 Heart failure, unspecified: Secondary | ICD-10-CM | POA: Diagnosis present

## 2016-02-12 DIAGNOSIS — Z823 Family history of stroke: Secondary | ICD-10-CM | POA: Diagnosis not present

## 2016-02-12 DIAGNOSIS — Z794 Long term (current) use of insulin: Secondary | ICD-10-CM

## 2016-02-12 DIAGNOSIS — R000 Tachycardia, unspecified: Secondary | ICD-10-CM | POA: Diagnosis present

## 2016-02-12 DIAGNOSIS — R531 Weakness: Secondary | ICD-10-CM

## 2016-02-12 DIAGNOSIS — Z7952 Long term (current) use of systemic steroids: Secondary | ICD-10-CM

## 2016-02-12 DIAGNOSIS — N400 Enlarged prostate without lower urinary tract symptoms: Secondary | ICD-10-CM | POA: Diagnosis present

## 2016-02-12 DIAGNOSIS — M898X9 Other specified disorders of bone, unspecified site: Secondary | ICD-10-CM | POA: Diagnosis present

## 2016-02-12 DIAGNOSIS — Z89412 Acquired absence of left great toe: Secondary | ICD-10-CM | POA: Diagnosis not present

## 2016-02-12 DIAGNOSIS — N2581 Secondary hyperparathyroidism of renal origin: Secondary | ICD-10-CM | POA: Diagnosis not present

## 2016-02-12 DIAGNOSIS — D631 Anemia in chronic kidney disease: Secondary | ICD-10-CM | POA: Diagnosis present

## 2016-02-12 DIAGNOSIS — R109 Unspecified abdominal pain: Secondary | ICD-10-CM

## 2016-02-12 DIAGNOSIS — I120 Hypertensive chronic kidney disease with stage 5 chronic kidney disease or end stage renal disease: Secondary | ICD-10-CM | POA: Diagnosis not present

## 2016-02-12 DIAGNOSIS — Z515 Encounter for palliative care: Secondary | ICD-10-CM

## 2016-02-12 DIAGNOSIS — Z91048 Other nonmedicinal substance allergy status: Secondary | ICD-10-CM

## 2016-02-12 DIAGNOSIS — R634 Abnormal weight loss: Secondary | ICD-10-CM | POA: Diagnosis not present

## 2016-02-12 DIAGNOSIS — K859 Acute pancreatitis without necrosis or infection, unspecified: Secondary | ICD-10-CM

## 2016-02-12 DIAGNOSIS — T8612 Kidney transplant failure: Secondary | ICD-10-CM | POA: Diagnosis present

## 2016-02-12 DIAGNOSIS — J90 Pleural effusion, not elsewhere classified: Secondary | ICD-10-CM | POA: Diagnosis not present

## 2016-02-12 DIAGNOSIS — R404 Transient alteration of awareness: Secondary | ICD-10-CM | POA: Diagnosis not present

## 2016-02-12 DIAGNOSIS — E43 Unspecified severe protein-calorie malnutrition: Secondary | ICD-10-CM | POA: Insufficient documentation

## 2016-02-12 DIAGNOSIS — Z85820 Personal history of malignant melanoma of skin: Secondary | ICD-10-CM | POA: Diagnosis not present

## 2016-02-12 DIAGNOSIS — I132 Hypertensive heart and chronic kidney disease with heart failure and with stage 5 chronic kidney disease, or end stage renal disease: Secondary | ICD-10-CM | POA: Diagnosis present

## 2016-02-12 DIAGNOSIS — R279 Unspecified lack of coordination: Secondary | ICD-10-CM | POA: Diagnosis not present

## 2016-02-12 DIAGNOSIS — I4891 Unspecified atrial fibrillation: Secondary | ICD-10-CM | POA: Diagnosis present

## 2016-02-12 DIAGNOSIS — R4181 Age-related cognitive decline: Secondary | ICD-10-CM | POA: Diagnosis not present

## 2016-02-12 DIAGNOSIS — Z7901 Long term (current) use of anticoagulants: Secondary | ICD-10-CM | POA: Diagnosis not present

## 2016-02-12 DIAGNOSIS — E1065 Type 1 diabetes mellitus with hyperglycemia: Secondary | ICD-10-CM | POA: Diagnosis not present

## 2016-02-12 DIAGNOSIS — E1122 Type 2 diabetes mellitus with diabetic chronic kidney disease: Secondary | ICD-10-CM | POA: Diagnosis present

## 2016-02-12 DIAGNOSIS — M6281 Muscle weakness (generalized): Secondary | ICD-10-CM | POA: Diagnosis not present

## 2016-02-12 DIAGNOSIS — E46 Unspecified protein-calorie malnutrition: Secondary | ICD-10-CM | POA: Diagnosis present

## 2016-02-12 DIAGNOSIS — E1142 Type 2 diabetes mellitus with diabetic polyneuropathy: Secondary | ICD-10-CM | POA: Diagnosis present

## 2016-02-12 DIAGNOSIS — E1129 Type 2 diabetes mellitus with other diabetic kidney complication: Secondary | ICD-10-CM | POA: Diagnosis not present

## 2016-02-12 DIAGNOSIS — I4821 Permanent atrial fibrillation: Secondary | ICD-10-CM

## 2016-02-12 DIAGNOSIS — R636 Underweight: Secondary | ICD-10-CM | POA: Diagnosis not present

## 2016-02-12 DIAGNOSIS — I482 Chronic atrial fibrillation: Secondary | ICD-10-CM | POA: Diagnosis present

## 2016-02-12 DIAGNOSIS — K8020 Calculus of gallbladder without cholecystitis without obstruction: Secondary | ICD-10-CM | POA: Diagnosis not present

## 2016-02-12 DIAGNOSIS — R2681 Unsteadiness on feet: Secondary | ICD-10-CM | POA: Diagnosis not present

## 2016-02-12 HISTORY — DX: Type 2 diabetes mellitus with diabetic neuropathy, unspecified: E11.40

## 2016-02-12 HISTORY — DX: Permanent atrial fibrillation: I48.21

## 2016-02-12 HISTORY — DX: Acute pancreatitis without necrosis or infection, unspecified: K85.90

## 2016-02-12 LAB — COMPREHENSIVE METABOLIC PANEL
ALT: 13 U/L — AB (ref 17–63)
AST: 31 U/L (ref 15–41)
Albumin: 2.3 g/dL — ABNORMAL LOW (ref 3.5–5.0)
Alkaline Phosphatase: 53 U/L (ref 38–126)
Anion gap: 9 (ref 5–15)
BUN: 28 mg/dL — ABNORMAL HIGH (ref 6–20)
CHLORIDE: 100 mmol/L — AB (ref 101–111)
CO2: 26 mmol/L (ref 22–32)
CREATININE: 2.13 mg/dL — AB (ref 0.61–1.24)
Calcium: 8.4 mg/dL — ABNORMAL LOW (ref 8.9–10.3)
GFR, EST AFRICAN AMERICAN: 33 mL/min — AB (ref >60)
GFR, EST NON AFRICAN AMERICAN: 29 mL/min — AB (ref >60)
Glucose, Bld: 297 mg/dL — ABNORMAL HIGH (ref 65–99)
POTASSIUM: 3.6 mmol/L (ref 3.5–5.1)
SODIUM: 135 mmol/L (ref 135–145)
Total Bilirubin: 1 mg/dL (ref 0.3–1.2)
Total Protein: 5.8 g/dL — ABNORMAL LOW (ref 6.5–8.1)

## 2016-02-12 LAB — I-STAT CHEM 8, ED
BUN: 29 mg/dL — AB (ref 6–20)
CHLORIDE: 98 mmol/L — AB (ref 101–111)
CREATININE: 2.1 mg/dL — AB (ref 0.61–1.24)
Calcium, Ion: 1.08 mmol/L — ABNORMAL LOW (ref 1.15–1.40)
Glucose, Bld: 283 mg/dL — ABNORMAL HIGH (ref 65–99)
HEMATOCRIT: 40 % (ref 39.0–52.0)
Hemoglobin: 13.6 g/dL (ref 13.0–17.0)
POTASSIUM: 3.5 mmol/L (ref 3.5–5.1)
SODIUM: 138 mmol/L (ref 135–145)
TCO2: 27 mmol/L (ref 0–100)

## 2016-02-12 LAB — URINALYSIS, ROUTINE W REFLEX MICROSCOPIC
GLUCOSE, UA: NEGATIVE mg/dL
KETONES UR: NEGATIVE mg/dL
Nitrite: NEGATIVE
PROTEIN: 100 mg/dL — AB
Specific Gravity, Urine: 1.017 (ref 1.005–1.030)
pH: 5.5 (ref 5.0–8.0)

## 2016-02-12 LAB — RENAL FUNCTION PANEL
ANION GAP: 7 (ref 5–15)
Albumin: 2.3 g/dL — ABNORMAL LOW (ref 3.5–5.0)
BUN: 28 mg/dL — ABNORMAL HIGH (ref 6–20)
CALCIUM: 8.4 mg/dL — AB (ref 8.9–10.3)
CHLORIDE: 102 mmol/L (ref 101–111)
CO2: 28 mmol/L (ref 22–32)
Creatinine, Ser: 2.07 mg/dL — ABNORMAL HIGH (ref 0.61–1.24)
GFR calc non Af Amer: 30 mL/min — ABNORMAL LOW (ref >60)
GFR, EST AFRICAN AMERICAN: 34 mL/min — AB (ref >60)
GLUCOSE: 257 mg/dL — AB (ref 65–99)
POTASSIUM: 3.6 mmol/L (ref 3.5–5.1)
Phosphorus: 2.8 mg/dL (ref 2.5–4.6)
Sodium: 137 mmol/L (ref 135–145)

## 2016-02-12 LAB — I-STAT TROPONIN, ED: Troponin i, poc: 0.01 ng/mL (ref 0.00–0.08)

## 2016-02-12 LAB — CBC WITH DIFFERENTIAL/PLATELET
BASOS ABS: 0 10*3/uL (ref 0.0–0.1)
Basophils Relative: 0 %
EOS PCT: 0 %
Eosinophils Absolute: 0 10*3/uL (ref 0.0–0.7)
HEMATOCRIT: 39.7 % (ref 39.0–52.0)
HEMOGLOBIN: 12.9 g/dL — AB (ref 13.0–17.0)
LYMPHS ABS: 1.5 10*3/uL (ref 0.7–4.0)
LYMPHS PCT: 14 %
MCH: 28.7 pg (ref 26.0–34.0)
MCHC: 32.5 g/dL (ref 30.0–36.0)
MCV: 88.4 fL (ref 78.0–100.0)
Monocytes Absolute: 0.9 10*3/uL (ref 0.1–1.0)
Monocytes Relative: 9 %
NEUTROS ABS: 7.7 10*3/uL (ref 1.7–7.7)
Neutrophils Relative %: 77 %
Platelets: 235 10*3/uL (ref 150–400)
RBC: 4.49 MIL/uL (ref 4.22–5.81)
RDW: 17.2 % — ABNORMAL HIGH (ref 11.5–15.5)
WBC: 10.1 10*3/uL (ref 4.0–10.5)

## 2016-02-12 LAB — MRSA PCR SCREENING: MRSA by PCR: POSITIVE — AB

## 2016-02-12 LAB — URINE MICROSCOPIC-ADD ON

## 2016-02-12 LAB — PROTIME-INR
INR: >10
Prothrombin Time: >90 seconds — ABNORMAL HIGH (ref 11.4–15.2)

## 2016-02-12 LAB — GLUCOSE, CAPILLARY: GLUCOSE-CAPILLARY: 286 mg/dL — AB (ref 65–99)

## 2016-02-12 LAB — PHOSPHORUS: PHOSPHORUS: 2.8 mg/dL (ref 2.5–4.6)

## 2016-02-12 LAB — TSH: TSH: 0.126 u[IU]/mL — AB (ref 0.350–4.500)

## 2016-02-12 LAB — MAGNESIUM: Magnesium: 1.3 mg/dL — ABNORMAL LOW (ref 1.7–2.4)

## 2016-02-12 LAB — LIPASE, BLOOD: LIPASE: 341 U/L — AB (ref 11–51)

## 2016-02-12 LAB — CBG MONITORING, ED
GLUCOSE-CAPILLARY: 266 mg/dL — AB (ref 65–99)
GLUCOSE-CAPILLARY: 281 mg/dL — AB (ref 65–99)

## 2016-02-12 MED ORDER — LEVOTHYROXINE SODIUM 25 MCG PO TABS: 137.0000 ug | ORAL_TABLET | Freq: Every day | ORAL | Status: DC

## 2016-02-12 MED ORDER — ONDANSETRON HCL 4 MG PO TABS: 4.0000 mg | ORAL_TABLET | Freq: Four times a day (QID) | ORAL | Status: DC | PRN

## 2016-02-12 MED ORDER — ALTEPLASE 2 MG IJ SOLR: 2.0000 mg | Freq: Once | INTRAMUSCULAR | Status: DC | PRN

## 2016-02-12 MED ORDER — HEPARIN SODIUM (PORCINE) 1000 UNIT/ML DIALYSIS
1000.0000 [IU] | INTRAMUSCULAR | Status: DC | PRN
  Filled 2016-02-12: qty 1

## 2016-02-12 MED ORDER — ACETAMINOPHEN 325 MG PO TABS: 650.0000 mg | ORAL_TABLET | Freq: Four times a day (QID) | ORAL | Status: DC | PRN

## 2016-02-12 MED ORDER — ROSUVASTATIN CALCIUM 10 MG PO TABS
5.0000 mg | ORAL_TABLET | Freq: Every evening | ORAL | Status: DC
  Administered 2016-02-12 – 2016-02-16 (×5): 5 mg via ORAL
  Filled 2016-02-12 (×5): qty 1

## 2016-02-12 MED ORDER — LIDOCAINE HCL (PF) 1 % IJ SOLN: 5.0000 mL | INTRAMUSCULAR | Status: DC | PRN

## 2016-02-12 MED ORDER — SODIUM CHLORIDE 0.9 % IV SOLN: 100.0000 mL | INTRAVENOUS | Status: DC | PRN

## 2016-02-12 MED ORDER — HEPARIN SODIUM (PORCINE) 1000 UNIT/ML DIALYSIS
20.0000 [IU]/kg | INTRAMUSCULAR | Status: DC | PRN
  Filled 2016-02-12: qty 2

## 2016-02-12 MED ORDER — INSULIN ASPART 100 UNIT/ML ~~LOC~~ SOLN
0.0000 [IU] | SUBCUTANEOUS | Status: DC
Start: 2016-02-12 — End: 2016-02-15
  Administered 2016-02-12: 5 [IU] via SUBCUTANEOUS
  Administered 2016-02-13: 2 [IU] via SUBCUTANEOUS
  Administered 2016-02-14 (×2): 5 [IU] via SUBCUTANEOUS
  Administered 2016-02-14: 3 [IU] via SUBCUTANEOUS

## 2016-02-12 MED ORDER — ACETAMINOPHEN 650 MG RE SUPP: 650.0000 mg | Freq: Four times a day (QID) | RECTAL | Status: DC | PRN

## 2016-02-12 MED ORDER — SODIUM CHLORIDE 0.9 % IV SOLN
INTRAVENOUS | Status: DC
  Administered 2016-02-12 – 2016-02-14 (×3): via INTRAVENOUS

## 2016-02-12 MED ORDER — PENTAFLUOROPROP-TETRAFLUOROETH EX AERO: 1.0000 "application " | INHALATION_SPRAY | CUTANEOUS | Status: DC | PRN

## 2016-02-12 MED ORDER — VITAMIN B-12 100 MCG PO TABS
100.0000 ug | ORAL_TABLET | Freq: Every day | ORAL | Status: DC
  Administered 2016-02-12 – 2016-02-17 (×5): 100 ug via ORAL
  Filled 2016-02-12 (×5): qty 1

## 2016-02-12 MED ORDER — METOPROLOL SUCCINATE ER 50 MG PO TB24
50.0000 mg | ORAL_TABLET | Freq: Every day | ORAL | Status: DC
  Administered 2016-02-12 – 2016-02-17 (×5): 50 mg via ORAL
  Filled 2016-02-12 (×4): qty 1

## 2016-02-12 MED ORDER — METOPROLOL TARTRATE 5 MG/5ML IV SOLN
5.0000 mg | Freq: Once | INTRAVENOUS | Status: AC
  Administered 2016-02-12: 5 mg via INTRAVENOUS
  Filled 2016-02-12: qty 5

## 2016-02-12 MED ORDER — BOOST / RESOURCE BREEZE PO LIQD
1.0000 | Freq: Three times a day (TID) | ORAL | Status: DC
  Administered 2016-02-13 – 2016-02-16 (×6): 1 via ORAL

## 2016-02-12 MED ORDER — INSULIN GLARGINE 100 UNIT/ML ~~LOC~~ SOLN
10.0000 [IU] | Freq: Every day | SUBCUTANEOUS | Status: DC
  Administered 2016-02-12 – 2016-02-14 (×3): 10 [IU] via SUBCUTANEOUS
  Filled 2016-02-12 (×4): qty 0.1

## 2016-02-12 MED ORDER — SODIUM CHLORIDE 0.9% FLUSH
3.0000 mL | Freq: Two times a day (BID) | INTRAVENOUS | Status: DC
  Administered 2016-02-12 – 2016-02-17 (×6): 3 mL via INTRAVENOUS

## 2016-02-12 MED ORDER — ACETAMINOPHEN 500 MG PO TABS: 500.0000 mg | ORAL_TABLET | Freq: Four times a day (QID) | ORAL | Status: DC | PRN

## 2016-02-12 MED ORDER — TAMSULOSIN HCL 0.4 MG PO CAPS
0.4000 mg | ORAL_CAPSULE | Freq: Every day | ORAL | Status: DC
  Administered 2016-02-12 – 2016-02-16 (×5): 0.4 mg via ORAL
  Filled 2016-02-12 (×5): qty 1

## 2016-02-12 MED ORDER — ONDANSETRON HCL 4 MG/2ML IJ SOLN: 4.0000 mg | Freq: Four times a day (QID) | INTRAMUSCULAR | Status: DC | PRN

## 2016-02-12 MED ORDER — CHLORHEXIDINE GLUCONATE CLOTH 2 % EX PADS
6.0000 | MEDICATED_PAD | Freq: Every day | CUTANEOUS | Status: DC
  Administered 2016-02-14 – 2016-02-17 (×3): 6 via TOPICAL

## 2016-02-12 MED ORDER — LIDOCAINE-PRILOCAINE 2.5-2.5 % EX CREA
1.0000 "application " | TOPICAL_CREAM | CUTANEOUS | Status: DC | PRN
  Filled 2016-02-12: qty 5

## 2016-02-12 MED ORDER — LEVALBUTEROL HCL 1.25 MG/0.5ML IN NEBU: 1.2500 mg | INHALATION_SOLUTION | Freq: Four times a day (QID) | RESPIRATORY_TRACT | Status: DC | PRN

## 2016-02-12 MED ORDER — TACROLIMUS 0.5 MG PO CAPS
0.5000 mg | ORAL_CAPSULE | Freq: Two times a day (BID) | ORAL | Status: DC
  Administered 2016-02-12 – 2016-02-17 (×9): 0.5 mg via ORAL
  Filled 2016-02-12 (×10): qty 1

## 2016-02-12 MED ORDER — PANTOPRAZOLE SODIUM 40 MG PO TBEC
40.0000 mg | DELAYED_RELEASE_TABLET | Freq: Two times a day (BID) | ORAL | Status: DC
  Administered 2016-02-12 – 2016-02-17 (×9): 40 mg via ORAL
  Filled 2016-02-12 (×9): qty 1

## 2016-02-12 MED ORDER — MUPIROCIN 2 % EX OINT
1.0000 "application " | TOPICAL_OINTMENT | Freq: Two times a day (BID) | CUTANEOUS | Status: DC
  Administered 2016-02-12 – 2016-02-17 (×9): 1 via NASAL
  Filled 2016-02-12: qty 22

## 2016-02-12 MED ORDER — SODIUM CHLORIDE 0.9 % IV BOLUS (SEPSIS)
250.0000 mL | Freq: Once | INTRAVENOUS | Status: AC
  Administered 2016-02-12: 250 mL via INTRAVENOUS

## 2016-02-12 MED ORDER — RENA-VITE PO TABS
1.0000 | ORAL_TABLET | Freq: Every day | ORAL | Status: DC
  Administered 2016-02-12 – 2016-02-16 (×5): 1 via ORAL
  Filled 2016-02-12 (×5): qty 1

## 2016-02-12 MED ORDER — POLYETHYLENE GLYCOL 3350 17 G PO PACK: 17.0000 g | PACK | Freq: Every day | ORAL | Status: DC | PRN

## 2016-02-12 MED ORDER — VITAMIN K1 10 MG/ML IJ SOLN
5.0000 mg | Freq: Once | INTRAVENOUS | Status: AC
  Administered 2016-02-12: 5 mg via INTRAVENOUS
  Filled 2016-02-12: qty 0.5

## 2016-02-12 NOTE — ED Provider Notes (Signed)
[facility] DEPT Provider Note   CSN: [id number] Arrival date & time: 02/12/16  1316     History   Chief Complaint No chief complaint on file.   HPI Mark [name] is a 75 y.o. male.  HPI Patient states he been very weak for 2 days. This is in the background of weight loss and poor by mouth intake over a number of months. Patient reports that he's lost about 40 pounds since this summer. He did manage to go to dialysis yesterday. See completed up to the last 30 minutes.He reports he vomited yesterday once before dialysis but remains very nauseated. He reports today however he was so weak that he could not transfer back into his bed. He had gotten up to drink a boost shake but after that he had no more energy to transfer or get up. He denies focal pain. He reports that he is supposed to get his dressings on his feet change regularly. 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 He also has a small wound to the right great toe which is getting dressed as well. No documented fevers. Past Medical History:  Diagnosis Date  . Atrial fibrillation ([place])   . Diabetes ([place])   . Gout   . Hypertension   . Hypothyroidism   . Renal disorder    kidney transplant   . Ulcer of other part of foot 12/15/2013    Patient Active Problem List   Diagnosis Date Noted  . Renal failure   . Melena   . CHF (congestive heart failure) ([place])   . Bibasilar crackles   . Aspiration of liquid   . SOB (shortness of breath)   . Permanent atrial fibrillation ([place])   . Renal transplant recipient 10/28/2015  . Diabetic ulcer of left great toe ([place]) 10/28/2015  . Gangrene of toe ([place]) 10/28/2015  . Cellulitis of great toe of left foot 10/28/2015  . Blister of foot with infection 02/22/2015  . Diabetic peripheral neuropathy ([place]) 06/12/2014  . Hammer toe of right foot 02/16/2014  . Onychomycosis 02/02/2014  . Pain in lower limb 02/02/2014  . Ulcer of right foot ([place]) 01/19/2014  .  Ulcer of right ankle ([place]) 01/08/2014  . Ulcer of other part of foot 12/15/2013  . Type II diabetes mellitus, uncontrolled ([place]) 10/20/2012  . Other and unspecified hyperlipidemia 10/20/2012  . Chronic kidney disease, stage III (moderate) 10/20/2012  . Unspecified hypothyroidism 10/20/2012  . Edema extremities 10/20/2012  . Gout 10/20/2012    Past Surgical History:  Procedure Laterality Date  . AMPUTATION Left 11/06/2015   Procedure: AMPUTATION LEFT GREAT TOE;  Surgeon: Elam Dutch, MD;  Location: [facility];  Service: Vascular;  Laterality: Left;  . ANKLE FRACTURE SURGERY    . AV FISTULA PLACEMENT Left 11/19/2015   Procedure: LEFT ARM ARTERIOVENOUS (AV) FISTULA CREATION;  Surgeon: Waynetta Sandy, MD;  Location: [facility];  Service: Vascular;  Laterality: Left;  . CATARACT EXTRACTION    . INSERTION OF DIALYSIS CATHETER Right 11/19/2015   Procedure: INSERTION OF DIALYSIS CATHETER RIGHT INTERNAL JUGULAR;  Surgeon: Waynetta Sandy, MD;  Location: [facility];  Service: Vascular;  Laterality: Right;  . IR GENERIC HISTORICAL  11/14/2015   IR FLUORO GUIDE CV LINE RIGHT 11/14/2015 Markus Daft, MD [facility]  . IR GENERIC HISTORICAL  11/14/2015   IR US GUIDE VASC ACCESS RIGHT 11/14/2015 Markus Daft, MD [facility]  . MELANOMA EXCISION    . NEPHRECTOMY  TRANSPLANTED ORGAN    . PERIPHERAL VASCULAR CATHETERIZATION N/A 11/05/2015   Procedure: Abdominal Aortogram w/Lower Extremity;  Surgeon: Serafina Mitchell, MD;  Location: [facility];  Service: Cardiovascular;  Laterality: N/A;  . PERIPHERAL VASCULAR CATHETERIZATION Left 11/05/2015   Procedure: Peripheral Vascular Atherectomy;  Surgeon: Serafina Mitchell, MD;  Location: [facility];  Service: Cardiovascular;  Laterality: Left;  popiteal  . PERIPHERAL VASCULAR CATHETERIZATION Left 11/05/2015   Procedure: Peripheral Vascular Balloon Angioplasty;  Surgeon: Serafina Mitchell, MD;  Location: [facility];  Service: Cardiovascular;   Laterality: Left;  anterior tib       Home Medications    Prior to Admission medications   Medication Sig Start Date End Date Taking? Authorizing Provider  allopurinol (ZYLOPRIM) 300 MG tablet Take 150 mg by mouth in the morning 11/26/15  Yes Silver Huguenin Elgergawy, MD  amoxicillin (AMOXIL) 500 MG capsule Take 500 mg by mouth 3 (three) times daily.   Yes Historical Provider, MD  doxycycline (VIBRAMYCIN) 100 MG capsule Take 100 mg by mouth 2 (two) times daily.   Yes Historical Provider, MD  acetaminophen (TYLENOL) 500 MG tablet Take 500 mg by mouth every 6 (six) hours as needed for mild pain or moderate pain.    Historical Provider, MD  ciprofloxacin (CIPRO) 500 MG tablet Take 1 tablet (500 mg total) by mouth 2 (two) times daily. Patient not taking: Reported on 02/12/2016 12/19/15   Sharmon Leyden Nickel, NP  feeding supplement (BOOST / RESOURCE BREEZE) LIQD Take 1 Container by mouth 3 (three) times daily with meals. Patient not taking: Reported on 02/12/2016 11/26/15   Silver Huguenin Elgergawy, MD  fenofibrate micronized (LOFIBRA) 134 MG capsule TAKE ONE CAPSULE EVERY DAY WITH A MEAL 07/11/15   Elayne Snare, MD  HYDROcodone-acetaminophen (NORCO/VICODIN) 5-325 MG tablet Take 1-2 tablets by mouth every 4 (four) hours as needed for moderate pain. 11/26/15   Silver Huguenin Elgergawy, MD  insulin aspart (NOVOLOG) 100 UNIT/ML injection Inject 0-15 Units into the skin 3 (three) times daily with meals. 11/26/15   Silver Huguenin Elgergawy, MD  insulin glargine (LANTUS) 100 UNIT/ML injection Inject 0.26 mLs (26 Units total) into the skin at bedtime. 11/26/15   Silver Huguenin Elgergawy, MD  ketoconazole (NIZORAL) 2 % shampoo Apply 1 application topically 2 (two) times a week.  12/14/13   Historical Provider, MD  levalbuterol Penne Lash) 1.25 MG/0.5ML nebulizer solution Take 1.25 mg by nebulization every 6 (six) hours as needed for wheezing or shortness of breath. 11/26/15   Albertine Patricia, MD  levothyroxine (SYNTHROID, LEVOTHROID) 137 MCG tablet  Take 137 mcg by mouth daily before breakfast.    Historical Provider, MD  metoprolol succinate (TOPROL-XL) 50 MG 24 hr tablet Take 50 mg by mouth daily.  06/18/12   Historical Provider, MD  multivitamin (RENA-VIT) TABS tablet Take 1 tablet by mouth at bedtime. 11/26/15   Silver Huguenin Elgergawy, MD  nystatin (MYCOSTATIN/NYSTOP) 100000 UNIT/GM POWD Apply 1 g topically daily as needed (irritation).  12/14/12   Historical Provider, MD  pantoprazole (PROTONIX) 40 MG tablet Take 1 tablet (40 mg total) by mouth 2 (two) times daily. 11/26/15   Silver Huguenin Elgergawy, MD  predniSONE (DELTASONE) 5 MG tablet Take 5 mg by mouth daily. 03/15/14   Historical Provider, MD  rosuvastatin (CRESTOR) 10 MG tablet Take 10 mg by mouth every evening.    Historical Provider, MD  tacrolimus (PROGRAF) 0.5 MG capsule Take 0.5 mg by mouth 2 (two) times daily.  Historical Provider, MD  tamsulosin (FLOMAX) 0.4 MG CAPS capsule Take 1 capsule (0.4 mg total) by mouth daily after supper. 11/26/15   Silver Huguenin Elgergawy, MD  warfarin (COUMADIN) 5 MG tablet Take 0.5-1 tablets (2.5-5 mg total) by mouth See admin instructions. 5 mg in the evening on Sun/Mon/Wed/Fri/Sat and 2.5 mg on Tues/Thurs 11/27/15   Albertine Patricia, MD    Family History Family History  Problem Relation Age of Onset  . Bradycardia Mother   . Stroke Father     Social History Social History  Substance Use Topics  . Smoking status: Never Smoker  . Smokeless tobacco: Never Used  . Alcohol use No     Allergies   Tape   Review of Systems Review of Systems 10 Systems reviewed and are negative for acute change except as noted in the HPI.  Physical Exam Updated Vital Signs BP 160/99   Pulse (!) 122   Temp 97.8 F (36.6 C) (Oral)   Resp 23   Ht 5\' 9"  (1.753 m)   Wt 160 lb (72.6 kg)   SpO2 97%   BMI 23.63 kg/m   Physical Exam  Constitutional: He is oriented to person, place, and time.  Patient is deconditioned in appearance. He is alert and nontoxic.  Respiratory distress at rest.  HENT:  Head: Normocephalic and atraumatic.  Mouth/Throat: Oropharynx is clear and moist.  Eyes: EOM are normal.  Cardiovascular:  Irregularly irregular, tachycardia.  Pulmonary/Chest: Effort normal.  Bibasilar rales.  Abdominal: Soft. Bowel sounds are normal. He exhibits no distension. There is no tenderness. There is no guarding.  Neurological: He is alert and oriented to person, place, and time. No cranial nerve deficit. He exhibits normal muscle tone. Coordination normal.  Patient has a great toe amputation on the left lower extremity. This is fairly well-healed. Still some erythema around the metatarsal head. No drainage or discharge. He also has a small erosive lesion on the right great toe dorsal surface. Right great toe is diffusely red but not edematous.  Skin: Skin is warm and dry. There is pallor.     ED Treatments / Results  Labs (all labs ordered are listed, but only abnormal results are displayed) Labs Reviewed  COMPREHENSIVE METABOLIC PANEL - Abnormal; Notable for the following:       Result Value   Chloride 100 (*)    Glucose, Bld 297 (*)    BUN 28 (*)    Creatinine, Ser 2.13 (*)    Calcium 8.4 (*)    Total Protein 5.8 (*)    Albumin 2.3 (*)    ALT 13 (*)    GFR calc non Af Amer 29 (*)    GFR calc Af Amer 33 (*)    All other components within normal limits  LIPASE, BLOOD - Abnormal; Notable for the following:    Lipase 341 (*)    All other components within normal limits  CBC WITH DIFFERENTIAL/PLATELET - Abnormal; Notable for the following:    Hemoglobin 12.9 (*)    RDW 17.2 (*)    All other components within normal limits  CBG MONITORING, ED - Abnormal; Notable for the following:    Glucose-Capillary 266 (*)    All other components within normal limits  I-STAT CHEM 8, ED - Abnormal; Notable for the following:    Chloride 98 (*)    BUN 29 (*)    Creatinine, Ser 2.10 (*)    Glucose, Bld 283 (*)    Calcium, Ion 1.08 (*)  All other components within normal limits  Alphonzo Lemmings, ED    EKG  EKG Interpretation  Date/Time:  Wednesday February 12 2016 15:24:36 EST Ventricular Rate:  118 PR Interval:    QRS Duration: 76 QT Interval:  328 QTC Calculation: 460 R Axis:   -32 Text Interpretation:  Atrial fibrillation Inferior infarct, old Anterior infarct, old Lateral leads are also involved agree, Confirmed by Johnney Killian, MD, Jeannie Done [phone]) on 02/12/2016 4:12:37 PM       Radiology Dg Chest Port 1 View  Result Date: 02/12/2016 CLINICAL DATA:  Weakness. EXAM: PORTABLE CHEST 1 VIEW COMPARISON:  01/22/2016 FINDINGS: Double lumen central venous catheter in place, unchanged. Heart size and pulmonary vascularity are normal. Lungs are clear. No bone abnormality. IMPRESSION: No active disease. Electronically Signed   By: Lorriane Shire M.D.   On: 02/12/2016 15:16    Procedures Procedures (including critical care time) CRITICAL CARE Performed by: Charlesetta Shanks   Total critical care time: 30 minutes  Critical care time was exclusive of separately billable procedures and treating other patients.  Critical care was necessary to treat or prevent imminent or life-threatening deterioration.  Critical care was time spent personally by me on the following activities: development of treatment plan with patient and/or surrogate as well as nursing, discussions with consultants, evaluation of patient's response to treatment, examination of patient, obtaining history from patient or surrogate, ordering and performing treatments and interventions, ordering and review of laboratory studies, ordering and review of radiographic studies, pulse oximetry and re-evaluation of patient's condition.  Medications Ordered in ED Medications  metoprolol (LOPRESSOR) injection 5 mg (not administered)  sodium chloride 0.9 % bolus 250 mL (not administered)     Initial Impression / Assessment and Plan / ED Course  I have  reviewed the triage vital signs and the nursing notes.  Pertinent labs & imaging results that were available during my care of the patient were reviewed by me and considered in my medical decision making (see chart for details).  Clinical Course     Final Clinical Impressions(s) / ED Diagnoses   Final diagnoses:  General weakness  ESRD (end stage renal disease) ([place])  Atrial fibrillation with RVR ([place])  Patient with increasing generalized weakness. He reports too weak to perform transfers or ADLs today. Patient does have known atrial fibrillation but has rapid rate response today. Weakness may be secondary to atrial fibrillation, consideration also for slight volume depletion. Patient did have dialysis yesterday although he reports becoming too weak to complete the last 30 minutes. Plan will be for admission with light hydration and rate control to determine if this improves the patient's symptoms. Patient has some erythema around to wound sites on each foot. At this time, I have lower suspicion for active cellulitis, the wounds do appear to be healing. He also appears to have a chronic component with report of significant weight loss over several months. Patient's lipase is elevated of uncertain significance at this time.  New Prescriptions New Prescriptions   No medications on file     Charlesetta Shanks, MD 02/12/16 [phone]

## 2016-02-12 NOTE — Progress Notes (Signed)
Admitting MD paged that pt is on the floor.

## 2016-02-12 NOTE — ED Notes (Signed)
Attempted to call report

## 2016-02-12 NOTE — ED Triage Notes (Signed)
Per [facility]: Pt complaining of generalized weakness over the last 2 days. PT received dialysis yesterday, had to cut session short by 30 minutes, Tuesday, Thursday, Saturday Pt has not been eating or drinking well for the past couple of days. Pts CBG was 398, non compliant with medication. Pt in A-fib, hx of same, rate controlled.

## 2016-02-12 NOTE — ED Notes (Signed)
CBG- 281 

## 2016-02-12 NOTE — Consult Note (Signed)
Reason for Consult:ESRD Referring Physician: Dr. McMullen  Chief Complaint: Weakness  Assessment/Plan: 1 Weakness - pt with exquisite tenderness over transplanted kidney suggestive of acute rejection; only on Prograf 0.5mg BID + Prednisone 5mg daily.  - Will request ultrasound of the transplanted kidney to look for any signs of rejection such as incr cortical echogen, incr size or decr echogen of pyramids. - If ultrasound is suggestive of rejection then will pulse Solumedrol for 2-3 days and increase daily Prednisone. We may below the critical threshold to prevent rejection with a dose of Prednisone 5mg daily. If pt has recurrent episodes of rejection that is an indication for a transplant nephrectomy which is no small task.  - E/o pancreatitis as well tenderness is certainly worse in the LLQ directly over the transplanted kidney. 2 ESRD: TTS @ [facility] EDW is listed at 79kg but pt left unit Tues after dialysis @ 77kg; currently listed as 72.6kg on admission. We will HD in the AM per regimen with no UF. 3  Failed Transplanted Kidney  - Continue Prednisone and Prograf for now; no need to check Prograf level as we are not going to be titrating up. 4. Anemia of ESRD: Stable 5. Metabolic Bone Disease: Check a phos and will manage iPTH at the clinic. 6. Hypertension: controlled. 7. DM  Dialyzes at [facility] on TTS since July 2017 (after failed transplant).  Primary Nephrologist Dr. Coladonato EDW 79kg (left at 77kg on Tues) [facility] 4/2.25 Heparin 3600 unit bolus Access RIJ cath; lt BCF placed July 2017  HPI: Mark [name] is a 75 y.o. male with ESRD with failed transplant from [facility] after ~10-12 years initiating dialysis July 2017. Currently on Prograf 0.5mg BID + Prednisone 5mg daily presenting with generalized weakness x 2-3 days with poor oral intake. He vomited before dialysis on Tuesday but was able to tolerate the treatment. Of note his EDW is listed as 79kg but he left dialysis at 77kg.  He denies constipation or diarrhea or dysuria. He is also on  Coumadin for afib with an INR found to be >10. He denies dyspnea, chest pain, cough, fever, chills, falls or syncopal episodes.    Past Medical History:  Diagnosis Date  . Atrial fibrillation (HCC)   . CHF (congestive heart failure) (HCC)   . Diabetes (HCC)   . Diabetic neuropathy (HCC)   . Gout   . Hypertension   . Hypothyroidism   . Renal disorder    kidney transplant   . Ulcer of other part of foot 12/15/2013   Allergies:  Allergies  Allergen Reactions  . Tape Other (See Comments)    SKIN WILL TEAR!!    Past Surgical History:  Procedure Laterality Date  . AMPUTATION Left 11/06/2015   Procedure: AMPUTATION LEFT GREAT TOE;  Surgeon: Charles E Fields, MD;  Location: [facility] OR;  Service: Vascular;  Laterality: Left;  . ANKLE FRACTURE SURGERY    . AV FISTULA PLACEMENT Left 11/19/2015   Procedure: LEFT ARM ARTERIOVENOUS (AV) FISTULA CREATION;  Surgeon: Brandon Christopher Cain, MD;  Location: [facility] OR;  Service: Vascular;  Laterality: Left;  . CATARACT EXTRACTION    . INSERTION OF DIALYSIS CATHETER Right 11/19/2015   Procedure: INSERTION OF DIALYSIS CATHETER RIGHT INTERNAL JUGULAR;  Surgeon: Brandon Christopher Cain, MD;  Location: [facility] OR;  Service: Vascular;  Laterality: Right;  . IR GENERIC HISTORICAL  11/14/2015   IR FLUORO GUIDE CV LINE RIGHT 11/14/2015 Adam Henn, MD [facility]  . IR GENERIC HISTORICAL  11/14/2015     IR US GUIDE VASC ACCESS RIGHT 11/14/2015 Markus Daft, MD [facility]  . MELANOMA EXCISION    . NEPHRECTOMY TRANSPLANTED ORGAN    . PERIPHERAL VASCULAR CATHETERIZATION N/A 11/05/2015   Procedure: Abdominal Aortogram w/Lower Extremity;  Surgeon: Serafina Mitchell, MD;  Location: [place] [facility];  Service: Cardiovascular;  Laterality: N/A;  . PERIPHERAL VASCULAR CATHETERIZATION Left 11/05/2015   Procedure: Peripheral Vascular Atherectomy;  Surgeon: Serafina Mitchell, MD;  Location: [place] [facility];  Service:  Cardiovascular;  Laterality: Left;  popiteal  . PERIPHERAL VASCULAR CATHETERIZATION Left 11/05/2015   Procedure: Peripheral Vascular Balloon Angioplasty;  Surgeon: Serafina Mitchell, MD;  Location: [place] [facility];  Service: Cardiovascular;  Laterality: Left;  anterior tib    Family History  Problem Relation Age of Onset  . Bradycardia Mother   . Stroke Father     Social History:  reports that he has never smoked. He has never used smokeless tobacco. He reports that he does not drink alcohol or use drugs.  ROS Pertinent items are noted in HPI.  Blood pressure (!) 155/91, pulse (!) 116, temperature 98.1 F (36.7 C), temperature source Oral, resp. rate (!) 25, height 5' 9" (1.753 m), weight 72.6 kg (160 lb), SpO2 100 %. General appearance: alert, cooperative and appears stated age Head: Normocephalic, without obvious abnormality, atraumatic Neck: no adenopathy, no carotid bruit, no JVD, supple, symmetrical, trachea midline and thyroid not enlarged, symmetric, no tenderness/mass/nodules Back: symmetric, no curvature. ROM normal. No CVA tenderness. Resp: clear to auscultation bilaterally Chest wall: no tenderness Cardio: regular rate and rhythm, S1, S2 normal, no murmur, click, rub or gallop GI: Tender esp in LLQ over the transplanted kidney Extremities: extremities normal, atraumatic, no cyanosis or edema Pulses: 2+ and symmetric Skin: Skin color, texture, turgor normal. No rashes or lesions Lymph nodes: Cervical, supraclavicular, and axillary nodes normal. Neurologic: Grossly normal  Results for orders placed or performed during the hospital encounter of 02/12/16 (from the past 48 hour(s))  CBG monitoring, ED     Status: Abnormal   Collection Time: 02/12/16  1:54 PM  Result Value Ref Range   Glucose-Capillary 266 (H) 65 - 99 mg/dL  Comprehensive metabolic panel     Status: Abnormal   Collection Time: 02/12/16  3:25 PM  Result Value Ref Range   Sodium 135 135 - 145 mmol/L    Potassium 3.6 3.5 - 5.1 mmol/L   Chloride 100 (L) 101 - 111 mmol/L   CO2 26 22 - 32 mmol/L   Glucose, Bld 297 (H) 65 - 99 mg/dL   BUN 28 (H) 6 - 20 mg/dL   Creatinine, Ser 2.13 (H) 0.61 - 1.24 mg/dL   Calcium 8.4 (L) 8.9 - 10.3 mg/dL   Total Protein 5.8 (L) 6.5 - 8.1 g/dL   Albumin 2.3 (L) 3.5 - 5.0 g/dL   AST 31 15 - 41 U/L   ALT 13 (L) 17 - 63 U/L   Alkaline Phosphatase 53 38 - 126 U/L   Total Bilirubin 1.0 0.3 - 1.2 mg/dL   GFR calc non Af Amer 29 (L) >60 mL/min   GFR calc Af Amer 33 (L) >60 mL/min    Comment: (NOTE) The eGFR has been calculated using the CKD EPI equation. This calculation has not been validated in all clinical situations. eGFR's persistently <60 mL/min signify possible Chronic Kidney Disease.    Anion gap 9 5 - 15  Lipase, blood     Status: Abnormal   Collection Time:  02/12/16  3:25 PM  Result Value Ref Range   Lipase 341 (H) 11 - 51 U/L  CBC with Differential     Status: Abnormal   Collection Time: 02/12/16  3:25 PM  Result Value Ref Range   WBC 10.1 4.0 - 10.5 K/uL   RBC 4.49 4.22 - 5.81 MIL/uL   Hemoglobin 12.9 (L) 13.0 - 17.0 g/dL   HCT 39.7 39.0 - 52.0 %   MCV 88.4 78.0 - 100.0 fL   MCH 28.7 26.0 - 34.0 pg   MCHC 32.5 30.0 - 36.0 g/dL   RDW 17.2 (H) 11.5 - 15.5 %   Platelets 235 150 - 400 K/uL   Neutrophils Relative % 77 %   Neutro Abs 7.7 1.7 - 7.7 K/uL   Lymphocytes Relative 14 %   Lymphs Abs 1.5 0.7 - 4.0 K/uL   Monocytes Relative 9 %   Monocytes Absolute 0.9 0.1 - 1.0 K/uL   Eosinophils Relative 0 %   Eosinophils Absolute 0.0 0.0 - 0.7 K/uL   Basophils Relative 0 %   Basophils Absolute 0.0 0.0 - 0.1 K/uL  Protime-INR     Status: Abnormal   Collection Time: 02/12/16  3:25 PM  Result Value Ref Range   Prothrombin Time >90.0 (H) 11.4 - 15.2 seconds    Comment: REPEATED TO VERIFY   INR >10.00 (HH)     Comment: REPEATED TO VERIFY CRITICAL RESULT CALLED TO, READ BACK BY AND VERIFIED WITH: L BISHOP,RN 1622 02/12/16 D BRADLEY   I-stat  troponin, ED     Status: None   Collection Time: 02/12/16  3:30 PM  Result Value Ref Range   Troponin i, poc 0.01 0.00 - 0.08 ng/mL   Comment 3            Comment: Due to the release kinetics of cTnI, a negative result within the first hours of the onset of symptoms does not rule out myocardial infarction with certainty. If myocardial infarction is still suspected, repeat the test at appropriate intervals.   I-stat Chem 8, ED     Status: Abnormal   Collection Time: 02/12/16  3:32 PM  Result Value Ref Range   Sodium 138 135 - 145 mmol/L   Potassium 3.5 3.5 - 5.1 mmol/L   Chloride 98 (L) 101 - 111 mmol/L   BUN 29 (H) 6 - 20 mg/dL   Creatinine, Ser 2.10 (H) 0.61 - 1.24 mg/dL   Glucose, Bld 283 (H) 65 - 99 mg/dL   Calcium, Ion 1.08 (L) 1.15 - 1.40 mmol/L   TCO2 27 0 - 100 mmol/L   Hemoglobin 13.6 13.0 - 17.0 g/dL   HCT 40.0 39.0 - 52.0 %  CBG monitoring, ED     Status: Abnormal   Collection Time: 02/12/16  5:01 PM  Result Value Ref Range   Glucose-Capillary 281 (H) 65 - 99 mg/dL  MRSA PCR Screening     Status: Abnormal   Collection Time: 02/12/16  5:55 PM  Result Value Ref Range   MRSA by PCR POSITIVE (A) NEGATIVE    Comment:        The GeneXpert MRSA Assay (FDA approved for NASAL specimens only), is one component of a comprehensive MRSA colonization surveillance program. It is not intended to diagnose MRSA infection nor to guide or monitor treatment for MRSA infections. RESULT CALLED TO, READ BACK BY AND VERIFIED WITH: C.FLETCHER,RN AT 1958 BY L.PITT 02/12/16   Renal function panel     Status: Abnormal  Collection Time: 02/12/16  6:29 PM  Result Value Ref Range   Sodium 137 135 - 145 mmol/L   Potassium 3.6 3.5 - 5.1 mmol/L   Chloride 102 101 - 111 mmol/L   CO2 28 22 - 32 mmol/L   Glucose, Bld 257 (H) 65 - 99 mg/dL   BUN 28 (H) 6 - 20 mg/dL   Creatinine, Ser 2.07 (H) 0.61 - 1.24 mg/dL   Calcium 8.4 (L) 8.9 - 10.3 mg/dL   Phosphorus 2.8 2.5 - 4.6 mg/dL   Albumin  2.3 (L) 3.5 - 5.0 g/dL   GFR calc non Af Amer 30 (L) >60 mL/min   GFR calc Af Amer 34 (L) >60 mL/min    Comment: (NOTE) The eGFR has been calculated using the CKD EPI equation. This calculation has not been validated in all clinical situations. eGFR's persistently <60 mL/min signify possible Chronic Kidney Disease.    Anion gap 7 5 - 15    Dg Chest Port 1 View  Result Date: 02/12/2016 CLINICAL DATA:  Weakness. EXAM: PORTABLE CHEST 1 VIEW COMPARISON:  01/22/2016 FINDINGS: Double lumen central venous catheter in place, unchanged. Heart size and pulmonary vascularity are normal. Lungs are clear. No bone abnormality. IMPRESSION: No active disease. Electronically Signed   By: Lorriane Shire M.D.   On: 02/12/2016 15:16     Dwana Melena, MD 02/12/2016, 8:12 PM

## 2016-02-12 NOTE — ED Notes (Signed)
The pt states that his home health nurse was trying to help him get ut of the wheelchair, but he was so weak his son had to come and help him get out of the wheelchair.

## 2016-02-12 NOTE — ED Triage Notes (Signed)
Pt was supposed to go to the wound center today for his toe, but did not go because he didn't feel well.

## 2016-02-12 NOTE — ED Notes (Signed)
Dr. Johnney Killian aware of the pts PT and INR

## 2016-02-12 NOTE — H&P (Signed)
[facility] Admission History and Physical Service Pager: [phone]  Patient name: Mark [name] Medical record number: [id number] Date of birth: 06-Aug-1940 Age: 75 y.o. Gender: male  Primary Care Provider: Rochel Brome, MD Consultants: Nephrology Code Status: DNR (confirmed during admission)  Chief Complaint: "Weakness"  Assessment and Plan: Mark [name] is a 75 y.o. male presenting with weakness and weight loss 2/2 poor oral intake over last 3 months. PMH is significant for ESRD (TTS, s/p kidney transplant), HTN, A-fib (rate controlled), DMT2 (s/p left hallux amputation), hypothyroidism, gout, chronic wounds (diabetes and melanoma of back).  #Weakness, subacute, progressive: Patient says weakness has been problem since he began loosing weight. Etiology at this time unknown however most likely related to dialysis versus protein calorie malnutrition versus dehydration versus deconditioning versus infectious. Has history of hypothyroidism. Weight on admission 160 lbs Compared to 228 lbs on 10/2015. Has worsened over past 2 days limiting his ability to ambulate. Denies h/o bleeding from fistula or rectal bleeding. Denies symptoms of stroke. No chest pain, shortness of breath, or new peripheral edema suggestive of new heart or liver disease. --Admit to inpatient under the care of medicine teaching service; attending physician Dr. Nori Riis --Gentle IV fluids --Obtain TSH, UA --PT/OT consult pending --Trend CBC --UA pending  #Elevated Lipase: Lipase 341 on admission. Strongly suggestive of acute pancreatitis. Patient has experienced emesis but only one brief episode at home day prior to admission. Does not endorse abdominal pain but epigastric abdominal pain was noted on physical exam. Exam was was not concerning for acute abdomen. He denies alcohol use or drug use. Patient is on Crestor at home as well as [place] --Clear liquid diet --Obtain abdominal  ultrasound --Lipid panel --Monitor I&O --Gentle fluids NS @50cc /hr  #Protein and Calorie Malnutrition/Weight Loss, Acute, Worsening: Likely 2/2 to protein and calorie malnourishment. Uncertain source for decreased oral intake. Patient seems to have minimal appetite and supplements with boost shakes. Weight on admission 160 lbs Compared to 228 lbs on 10/2015. Drop in weight seems to be aligned with initiation of dialysis, but cannot rule out other sources at this time (neoplasm, chronic infection, endocrine). --Clear liquid diet, advance as tolerated --Monitor I&Os --Daily weights --Nutrition consult pending  #ESRD (HD TTS), with azotemia, S/p Kidney Transplant: Patient sees [facility] in [place]. Says he received dialysis day before admission but missed last 30 mins of session. Has kidney transplant which failed 2/2 CT contrast dye in past. Takes tacrolimus. Left arm AV fistula. --Continue home tacrolimus 0.5 mg BID --Nephrology consulted, appreciate recs, HD 11/09 --F/u CBC, renal panel, mag, phos in AM  #A. Fib, on Warfarin and subsequent Supratheraputic INR, Acute, Uncontrolled: Currently asymptomatic. Blood pressure stable. Tachycardic to the 110s. INR measured >10 on admission. On coumadin for a-fib. Patient uncertain on his coumadin dose but denies recent history of bleeds or bloody stools. --Holding coumadin --Coumadin per pharmacy --Vitamin K 5 mg IV once --F/u CBC and INR in AM  #Chronic Wounds/H/o Melanoma Removal of Back, Chronic, Stable: Sees wound care in [facility] for chronic lower extremity wounds. Melanoma removed 35 years ago on upper back. No evidence concerning for cellulitis or deep tissue infection at this time. --Wound care consult  #Hyeprtension, Chronic, Stable: Hypertensive at 150-170s/90s. Takes Toprolol 50 mg QD at home. --Toprolol 50 mg QD  #Diabetes Mellitus Type 2, Chronic, Stable: CBGs mid to high 200s on admission. Takes 26 U Lantus, and 0-15 U  Novolog TID at home. --Lantus 10 U QD --Sensitive SSI --  Monitor CBGs --Obtain A1c  #Gout, Chronic, Controlled: Takes allopurinol 150 mg QD. --Holding allopurinol for now given need for HD, consider restarting  #Hypothyroidism, Chronic: Takes synthroid 137 mcg QD. --Synthroid 137 mcg QD --TSH pending  FEN/GI: Clear liquid diet, Protonix 40 mg BID Prophylaxis: Supratheraputic INR >10  Disposition: Pending improvement of weakness, supratheraputic INR, and source of weight loss  History of Present Illness:  Mark [name] is a 75 y.o. male presenting with weakness and weight loss 2/2 poor oral intake over last 3 months. PMH is significant for ESRD (TTS, s/p kidney transplant), HTN, A-fib (rate controlled), DMT2 (s/p left hallux amputation), hypothyroidism, gout, chronic wounds (diabetes and melanoma of back).  Patient is c/o weakness for the past 3 months worsening over 2 days. He states his oral intake has been poor over the past 3 months and he is loosing weight. Was receiving dialysis yesterday but did not complete last 30 mins of session. Patient reports episode of emesis yesterday prior to dialysis but has not had vomited since. Patient reports he could not transfer back into his bed due to his weakness. He says he drank boost supplement and peach drink at home this morning, and ate croissant with egg and cheese yesterday. Last bowel movement was yesterday but no h/o bloody stools. Patient called PCP and was told to come to the ED for dialysis. Patient denies bleeding, pain, fever, chills, headache, chest pain, dyspnea, swelling, diarrhea, dysuria, lightheadedness, or vertigo.  Review Of Systems: Complete ROS performed, see HPI for pertinent ROS.  Patient Active Problem List   Diagnosis Date Noted  . Atrial fibrillation with RVR ([place]) 02/12/2016  . Renal failure   . Melena   . CHF (congestive heart failure) ([place])   . Bibasilar crackles   . Aspiration of liquid   . SOB (shortness  of breath)   . Permanent atrial fibrillation ([place])   . Renal transplant recipient 10/28/2015  . Diabetic ulcer of left great toe ([place]) 10/28/2015  . Gangrene of toe ([place]) 10/28/2015  . Cellulitis of great toe of left foot 10/28/2015  . Blister of foot with infection 02/22/2015  . Diabetic peripheral neuropathy ([place]) 06/12/2014  . Hammer toe of right foot 02/16/2014  . Onychomycosis 02/02/2014  . Pain in lower limb 02/02/2014  . Ulcer of right foot ([place]) 01/19/2014  . Ulcer of right ankle ([place]) 01/08/2014  . Ulcer of other part of foot 12/15/2013  . Type II diabetes mellitus, uncontrolled ([place]) 10/20/2012  . Other and unspecified hyperlipidemia 10/20/2012  . Chronic kidney disease, stage III (moderate) 10/20/2012  . Unspecified hypothyroidism 10/20/2012  . Edema extremities 10/20/2012  . Gout 10/20/2012    Past Medical History: Past Medical History:  Diagnosis Date  . Atrial fibrillation ([place])   . CHF (congestive heart failure) ([place])   . Diabetes ([place])   . Diabetic neuropathy ([place])   . Gout   . Hypertension   . Hypothyroidism   . Renal disorder    kidney transplant   . Ulcer of other part of foot 12/15/2013    Past Surgical History: Past Surgical History:  Procedure Laterality Date  . AMPUTATION Left 11/06/2015   Procedure: AMPUTATION LEFT GREAT TOE;  Surgeon: Elam Dutch, MD;  Location: [place];  Service: Vascular;  Laterality: Left;  . ANKLE FRACTURE SURGERY    . AV FISTULA PLACEMENT Left 11/19/2015   Procedure: LEFT ARM ARTERIOVENOUS (AV) FISTULA CREATION;  Surgeon: Waynetta Sandy, MD;  Location: [place];  Service:  Vascular;  Laterality: Left;  . CATARACT EXTRACTION    . INSERTION OF DIALYSIS CATHETER Right 11/19/2015   Procedure: INSERTION OF DIALYSIS CATHETER RIGHT INTERNAL JUGULAR;  Surgeon: Waynetta Sandy, MD;  Location: [place];  Service: Vascular;  Laterality: Right;  . IR GENERIC HISTORICAL  11/14/2015   IR FLUORO GUIDE CV LINE RIGHT 11/14/2015  Markus Daft, MD [facility]-INTERV RAD  . IR GENERIC HISTORICAL  11/14/2015   IR US GUIDE VASC ACCESS RIGHT 11/14/2015 Markus Daft, MD [facility]-INTER[facility]  . MELANOMA EXCISION    . NEPHRECTOMY TRANSPLANTED ORGAN    . PERIPHERAL VASCULAR CATHETERIZATION N/A 11/05/2015   Procedure: Abdominal Aortogram w/Lower Extremity;  Surgeon: Serafina Mitchell, MD;  Location: [place] [facility];  Service: Cardiovascular;  Laterality: N/A;  . PERIPHERAL VASCULAR CATHETERIZATION Left 11/05/2015   Procedure: Peripheral Vascular Atherectomy;  Surgeon: Serafina Mitchell, MD;  Location: [place] [facility];  Service: Cardiovascular;  Laterality: Left;  popiteal  . PERIPHERAL VASCULAR CATHETERIZATION Left 11/05/2015   Procedure: Peripheral Vascular Balloon Angioplasty;  Surgeon: Serafina Mitchell, MD;  Location: [place] [facility];  Service: Cardiovascular;  Laterality: Left;  anterior tib    Social History: Social History  Substance Use Topics  . Smoking status: Never Smoker  . Smokeless tobacco: Never Used  . Alcohol use No   Additional social history: lives at home with daughter and has home health care. Please also refer to relevant sections of EMR.  Family History: Family History  Problem Relation Age of Onset  . Bradycardia Mother   . Stroke Father    Allergies and Medications: Allergies  Allergen Reactions  . Tape Other (See Comments)    SKIN WILL TEAR!!   No current facility-administered medications on file prior to encounter.    Current Outpatient Prescriptions on File Prior to Encounter  Medication Sig Dispense Refill  . acetaminophen (TYLENOL) 500 MG tablet Take 500 mg by mouth every 6 (six) hours as needed for mild pain or moderate pain.    Marland Kitchen allopurinol (ZYLOPRIM) 300 MG tablet Take 150 mg by mouth in the morning 15 tablet 5  . ciprofloxacin (CIPRO) 500 MG tablet Take 1 tablet (500 mg total) by mouth 2 (two) times daily. (Patient not taking: Reported on 02/12/2016) 20 tablet 0  . feeding supplement (BOOST / RESOURCE  BREEZE) LIQD Take 1 Container by mouth 3 (three) times daily with meals. (Patient not taking: Reported on 02/12/2016)  0  . fenofibrate micronized (LOFIBRA) 134 MG capsule TAKE ONE CAPSULE EVERY DAY WITH A MEAL (Patient taking differently: Take 134 mg by mouth once a day) 90 capsule 1  . HYDROcodone-acetaminophen (NORCO/VICODIN) 5-325 MG tablet Take 1-2 tablets by mouth every 4 (four) hours as needed for moderate pain. 30 tablet 0  . insulin aspart (NOVOLOG) 100 UNIT/ML injection Inject 0-15 Units into the skin 3 (three) times daily with meals. 10 mL 11  . insulin glargine (LANTUS) 100 UNIT/ML injection Inject 0.26 mLs (26 Units total) into the skin at bedtime. 10 mL 11  . ketoconazole (NIZORAL) 2 % shampoo Apply 1 application topically 2 (two) times a week.     . levalbuterol (XOPENEX) 1.25 MG/0.5ML nebulizer solution Take 1.25 mg by nebulization every 6 (six) hours as needed for wheezing or shortness of breath. 1 each 12  . levothyroxine (SYNTHROID, LEVOTHROID) 137 MCG tablet Take 137 mcg by mouth daily before breakfast.    . metoprolol succinate (TOPROL-XL) 50 MG 24 hr tablet Take 50 mg by mouth  daily.     . multivitamin (RENA-VIT) TABS tablet Take 1 tablet by mouth at bedtime.  0  . nystatin (MYCOSTATIN/NYSTOP) 100000 UNIT/GM POWD Apply 1 g topically daily as needed (irritation).     . pantoprazole (PROTONIX) 40 MG tablet Take 1 tablet (40 mg total) by mouth 2 (two) times daily.    . predniSONE (DELTASONE) 5 MG tablet Take 5 mg by mouth daily.  0  . tacrolimus (PROGRAF) 0.5 MG capsule Take 0.5 mg by mouth 2 (two) times daily.    . tamsulosin (FLOMAX) 0.4 MG CAPS capsule Take 1 capsule (0.4 mg total) by mouth daily after supper. 30 capsule   . warfarin (COUMADIN) 5 MG tablet Take 0.5-1 tablets (2.5-5 mg total) by mouth See admin instructions. 5 mg in the evening on Sun/Mon/Wed/Fri/Sat and 2.5 mg on Tues/Thurs      Objective: BP (!) 173/90 (BP Location: Right Arm)   Pulse (!) 111   Temp 98.3 F  (36.8 C) (Oral)   Resp (!) 23   Ht 5\' 9"  (1.753 m)   Wt 160 lb (72.6 kg)   SpO2 99%   BMI 23.63 kg/m  Exam: General: frail elderly male, well developed, in no acute distress with non-toxic appearance HEENT: normocephalic, atraumatic, moist mucous membranes, sclera normal Neck: supple, non-tender without lymphadenopathy CV: irregularly irregular and tachycardic without murmurs, rubs, or gallops Lungs: clear to auscultation bilaterally with normal work of breathing Abdomen: soft, Significant epigastric and upper quadrant abdominal pain to moderate palpation, normoactive bowel sounds, firm thicken periumbilical skin Skin: warm, dry, cap refill <2 seconds, large chronic wound on thoracic back (s/p sugery for melanoma) Extremities: warm and well perfused, normal tone, right ecchymosis on shin, left hallux amputation with dry dressing and w/o erythema or purulence, small erosive lesion on the right great toe dorsal surface w/o erythema or purulence. Left-sided upper extremity fistula in place with bruit. Neuro: A&O to person, place, and time  Labs and Imaging: CBC BMET   Recent Labs Lab 02/12/16 1525 02/12/16 1532  WBC 10.1  --   HGB 12.9* 13.6  HCT 39.7 40.0  PLT 235  --     Recent Labs Lab 02/12/16 1525 02/12/16 1532  NA 135 138  K 3.6 3.5  CL 100* 98*  CO2 26  --   BUN 28* 29*  CREATININE 2.13* 2.10*  GLUCOSE 297* 283*  CALCIUM 8.4*  --      Lab Results  Component Value Date   INR >10.00 (HH) 02/12/2016   INR 3.58 11/26/2015   INR 2.84 11/25/2015   Lab Results  Component Value Date   LIPASE 341 (H) 02/12/2016   DG Chest Port 1 View (02/12/16) FINDINGS: Double lumen central venous catheter in place, unchanged. Heart size and pulmonary vascularity are normal. Lungs are clear. No bone abnormality.  IMPRESSION: No active disease.    Burton Bing, DO 02/12/2016, 6:36 PM PGY-1, [place] Intern pager: [phone], text pages  welcome    Upper Level Addendum:  I have seen and evaluated this patient along with Dr. Yisroel Ramming and reviewed the above note, making necessary revisions in red.   Elberta Leatherwood, MD,MS,  PGY3 02/12/2016 10:01 PM

## 2016-02-12 NOTE — Progress Notes (Signed)
ANTICOAGULATION CONSULT NOTE - Follow Up Consult  Pharmacy Consult for coumadin Indication: atrial fibrillation  Allergies  Allergen Reactions  . Tape Other (See Comments)    SKIN WILL TEAR!!    Patient Measurements: Height: 5\' 9"  (175.3 cm) Weight: 160 lb (72.6 kg) IBW/kg (Calculated) : 70.7   Vital Signs: Temp: 98.1 F (36.7 C) (11/08 1932) Temp Source: Oral (11/08 1932) BP: 155/91 (11/08 1932) Pulse Rate: 116 (11/08 1932)  Labs:  Recent Labs  02/12/16 1525 02/12/16 1532 02/12/16 1829  HGB 12.9* 13.6  --   HCT 39.7 40.0  --   PLT 235  --   --   LABPROT >90.0*  --   --   INR >10.00*  --   --   CREATININE 2.13* 2.10* 2.07*    Estimated Creatinine Clearance: 30.8 mL/min (by C-G formula based on SCr of 2.07 mg/dL (H)).   Medications:  See EMR  Assessment: 75 YOM on coumadin PTA for Afib. INR on admission >10 - no overt bleeding. Vit K 5mg  IV x1 ordered. Per discussion with med rec technician, pt and family unclear in regard to medication regimen or recent doses - appears that home health takes care of this.   Per last anti-coag note in chart, pt taking 3mg  daily resulting in a subtherapeutic INR. Dose was increased with plan for F/U in one week - no record of further INR/dose adjustments. Noted pt has had poor oral intake over the past few months.   Hgb 12.9, plt 235    Goal of Therapy:  INR 2-3 Monitor platelets by anticoagulation protocol: Yes   Plan:  -Hold coumadin until INR <3 -Daily INR -Monitor CBC, s/sx bleeding   Stephens November, PharmD Clinical Pharmacist 9:21 PM, 02/12/2016

## 2016-02-13 ENCOUNTER — Inpatient Hospital Stay (HOSPITAL_COMMUNITY): Payer: Medicare Other

## 2016-02-13 LAB — GLUCOSE, CAPILLARY
GLUCOSE-CAPILLARY: 71 mg/dL (ref 65–99)
GLUCOSE-CAPILLARY: 114 mg/dL — AB (ref 65–99)
Glucose-Capillary: 162 mg/dL — ABNORMAL HIGH (ref 65–99)
Glucose-Capillary: 242 mg/dL — ABNORMAL HIGH (ref 65–99)
Glucose-Capillary: 68 mg/dL (ref 65–99)
Glucose-Capillary: 80 mg/dL (ref 65–99)

## 2016-02-13 LAB — RENAL FUNCTION PANEL
ANION GAP: 9 (ref 5–15)
Albumin: 2 g/dL — ABNORMAL LOW (ref 3.5–5.0)
BUN: 31 mg/dL — ABNORMAL HIGH (ref 6–20)
CALCIUM: 8.2 mg/dL — AB (ref 8.9–10.3)
CHLORIDE: 103 mmol/L (ref 101–111)
CO2: 25 mmol/L (ref 22–32)
Creatinine, Ser: 1.91 mg/dL — ABNORMAL HIGH (ref 0.61–1.24)
GFR calc non Af Amer: 33 mL/min — ABNORMAL LOW (ref >60)
GFR, EST AFRICAN AMERICAN: 38 mL/min — AB (ref >60)
GLUCOSE: 183 mg/dL — AB (ref 65–99)
POTASSIUM: 3.1 mmol/L — AB (ref 3.5–5.1)
Phosphorus: 2.7 mg/dL (ref 2.5–4.6)
SODIUM: 137 mmol/L (ref 135–145)

## 2016-02-13 LAB — AMYLASE: Amylase: 136 U/L — ABNORMAL HIGH (ref 28–100)

## 2016-02-13 LAB — CBC
HCT: 36.6 % — ABNORMAL LOW (ref 39.0–52.0)
HEMOGLOBIN: 12.2 g/dL — AB (ref 13.0–17.0)
MCH: 29.5 pg (ref 26.0–34.0)
MCHC: 33.3 g/dL (ref 30.0–36.0)
MCV: 88.4 fL (ref 78.0–100.0)
Platelets: 241 10*3/uL (ref 150–400)
RBC: 4.14 MIL/uL — AB (ref 4.22–5.81)
RDW: 17.4 % — ABNORMAL HIGH (ref 11.5–15.5)
WBC: 10.8 10*3/uL — ABNORMAL HIGH (ref 4.0–10.5)

## 2016-02-13 LAB — LIPID PANEL
CHOL/HDL RATIO: 5.7 ratio
Cholesterol: 74 mg/dL (ref 0–200)
HDL: 13 mg/dL — AB (ref >40)
LDL CALC: 39 mg/dL (ref 0–99)
TRIGLYCERIDES: 109 mg/dL (ref <150)
VLDL: 22 mg/dL (ref 0–40)

## 2016-02-13 LAB — PROTIME-INR
INR: 4.36
Prothrombin Time: 42.8 seconds — ABNORMAL HIGH (ref 11.4–15.2)

## 2016-02-13 LAB — LIPASE, BLOOD: Lipase: 167 U/L — ABNORMAL HIGH (ref 11–51)

## 2016-02-13 LAB — URIC ACID: URIC ACID, SERUM: 0.7 mg/dL — AB (ref 4.4–7.6)

## 2016-02-13 MED ORDER — LEVOTHYROXINE SODIUM 100 MCG PO TABS
125.0000 ug | ORAL_TABLET | Freq: Every day | ORAL | Status: DC
  Administered 2016-02-14 – 2016-02-17 (×4): 125 ug via ORAL
  Filled 2016-02-13 (×5): qty 1

## 2016-02-13 MED ORDER — PREDNISONE 5 MG PO TABS
5.0000 mg | ORAL_TABLET | Freq: Every day | ORAL | Status: DC
  Administered 2016-02-14 – 2016-02-17 (×4): 5 mg via ORAL
  Filled 2016-02-13 (×4): qty 1

## 2016-02-13 NOTE — Progress Notes (Signed)
Family Medicine Teaching Service Daily Progress Note Intern Pager: [phone]  Patient name: Mark [name] Medical record number: [id number] Date of birth: May 14, 1940 Age: 75 y.o. Gender: male  Primary Care Provider: Rochel Brome, MD Consultants: Nephrology Code Status: DNR (confirmed during admission)  Pt Overview and Major Events to Date:  11/08: Admit for unexplained 60 lb weight loss and weakness for 3 months 11/09: HD  Assessment and Plan: Mark [name] is a 75 y.o. male presenting with weakness and weight loss 2/2 poor oral intake over last 3 months. PMH is significant for ESRD (TTS, s/p kidney transplant), HTN, A-fib (rate controlled), DMT2 (s/p left hallux amputation), hypothyroidism, gout, chronic wounds (diabetes and melanoma of back).  #Weakness, Subacute, Progressive: Patient says weakness has been problem since he began loosing weight 3 months ago. Etiology at this time unknown however most likely related to acute transplant rejection per nephrology note on 11/08 given tenderness at transplant site. DDx also includes dialysis versus protein calorie malnutrition versus dehydration versus deconditioning versus infectious. Has history of hypothyroidism, TSH low 0.126. Weight on admission 160 lbs compared to 228 lbs on 10/2015. Has worsened over past 2 days limiting his ability to ambulate. Denies h/o bleeding from fistula or rectal bleeding. Denies symptoms of stroke. No chest pain, shortness of breath, or new peripheral edema suggestive of new heart or liver disease. UA with mod Hgb and large leuks with rare bacteria on micro with yeast present. --Gentle IV fluids --UCx pending --PT/OT consult pending --Trend CBC, Hgb stable  #Elevated Lipase, Acute, Possible Pancreatitis: Lipase 341 on admission. Suggestive of acute pancreatitis. Patient has experienced emesis but only one brief episode at home day prior to admission. Endorsed pain under naval near transplant site but no  epigastric tenderness on exam. Exam was was not concerning for acute abdomen. He denies alcohol use or drug use. Patient is on Crestor at home as well as [place]. Lipid panel wnl on 11/09. --Clear liquid diet --Patient will need to stop [place] while on dialysis and should be stopped on d/c --Pending abdominal ultrasound --Monitor I&O --Gentle fluids NS @50cc /hr  #Protein and Calorie Malnutrition/Weight Loss, Acute, Worsening: Likely 2/2 to protein and calorie malnourishment. Uncertain source for decreased oral intake. Never smoker. Patient seems to have minimal appetite and supplements with boost shakes. Weight on admission 160 lbs Compared to 228 lbs on 10/2015. Drop in weight seems to be aligned with initiation of dialysis, but cannot rule out other sources at this time (neoplasm, chronic infection, endocrine).  --Clear liquid diet, advance as tolerated --Monitor I&Os --Daily weights --Nutrition consult pending  #ESRD (HD TTS), with Azotemia, S/p Kidney Transplant: Patient sees [facility] in [place]. Says he received dialysis day before admission but missed last 30 mins of session. Has kidney transplant which failed 2/2 CT contrast dye in past. Takes tacrolimus and prednisone 5 mg QD at home. Left arm AV fistula not yet mature. --Continue home tacrolimus 0.5 mg BID --Continue home Prednisone 5 mg QD --Nephrology consulted, appreciate recs, HD 11/09, believe acute rejection of transplant, U/S pending --F/u CBC, renal panel, mag, phos in AM --Consider pulsed solumedrol if U/S pending permits --Will need to determine cancer screenings  #A-Fib, Supratheraputic INR on Warfarin, Acute, Uncontrolled: Currently asymptomatic. Blood pressure stable. Tachycardic to the 110s. INR measured >10 on admission. On coumadin for a-fib. Patient uncertain on his coumadin dose but denies recent history of bleeds or bloody stools. Vitamin K 5 mg IV given on admission with INR 4.36 on 11/09. --Holding  coumadin --Coumadin per pharmacy --F/u CBC and INR  #Chronic Wounds/H/o Melanoma Removal of Back, Chronic, Stable: Sees wound care in [facility] for chronic lower extremity wounds. Melanoma removed 35 years ago on upper back. No evidence concerning for cellulitis or deep tissue infection at this time. --Wound care consult  #Hyeprtension, Chronic, Stable: Hypertensive at 150-170s/90s. Takes Toprolol 50 mg QD at home. --Toprolol 50 mg QD  #Diabetes Mellitus Type 2, Chronic, Stable: CBGs mid to high 100s overnight. CBG 68 this AM on HD. Takes 26 U Lantus, and 0-15 U Novolog TID at home. --Lantus 10 U QD --Sensitive SSI --Monitor CBGs --A1c pending  #Gout, Chronic, Controlled: Takes allopurinol 150 mg QD. --Holding allopurinol for now given need for HD, consider restarting --Uric acid level pending  #Hypothyroidism, Chronic: TSH low 0.126. Takes synthroid 137 mcg QD.  --Will decrease Synthroid to 125 mcg QD, and continue this dose on d/c  FEN/GI: Clear liquid diet, Protonix 40 mg BID Prophylaxis: Supratheraputic INR  Disposition: Pending improvement of weakness, supratheraputic INR, and source of weight loss  Subjective:  Patient laying in bed receiving dialysis relaxed and without complaints other than "feeling cold." Says he drank a boost this AM. Denies chest pain, dyspnea, nausea, abdominal pain.   Objective: Temp:  [97.8 F (36.6 C)-98.6 F (37 C)] 98.2 F (36.8 C) (11/09 0815) Pulse Rate:  [50-149] 87 (11/09 0900) Resp:  [15-27] 22 (11/09 0900) BP: (136-173)/(58-102) 136/61 (11/09 0900) SpO2:  [95 %-100 %] 100 % (11/09 0423) Weight:  [160 lb (72.6 kg)-175 lb 0.7 oz (79.4 kg)] 175 lb 0.7 oz (79.4 kg) (11/09 0815) Physical Exam: General: frail elderly male, well developed, in no acute distress with non-toxic appearance HEENT: normocephalic, atraumatic, moist mucous membranes, white sclera Neck: supple, non-tender without lymphadenopathy CV: irregularly  irregular without murmurs, rubs, or gallops Lungs: clear to auscultation bilaterally with normal work of breathing Abdomen: soft, tenderness near renal transplanted kidney, normoactive bowel sounds Skin: warm, dry, cap refill <2 seconds Extremities: warm and well perfused, normal tone, right ecchymosis on shin, left hallux amputation with dry dressing and w/o erythema or purulence, small erosive lesion on the right great toe dorsal surface w/o erythema or purulence, left-sided upper extremity fistula in place with bruit Neuro: A&O to person, place, and time  Laboratory:  Recent Labs Lab 02/12/16 1525 02/12/16 1532 02/13/16 0339  WBC 10.1  --  10.8*  HGB 12.9* 13.6 12.2*  HCT 39.7 40.0 36.6*  PLT 235  --  241    Recent Labs Lab 02/12/16 1525 02/12/16 1532 02/12/16 1829 02/13/16 0339  NA 135 138 137 137  K 3.6 3.5 3.6 3.1*  CL 100* 98* 102 103  CO2 26  --  28 25  BUN 28* 29* 28* 31*  CREATININE 2.13* 2.10* 2.07* 1.91*  CALCIUM 8.4*  --  8.4* 8.2*  PROT 5.8*  --   --   --   BILITOT 1.0  --   --   --   ALKPHOS 53  --   --   --   ALT 13*  --   --   --   AST 31  --   --   --   GLUCOSE 297* 283* 257* 183*   Imaging/Diagnostic Tests: DG Chest Port 1 View (02/12/16) FINDINGS: Double lumen central venous catheter in place, unchanged. Heart size and pulmonary vascularity are normal. Lungs are clear. No bone abnormality.  IMPRESSION: No active disease.  [place] Bing, DO 02/13/2016, 9:28 AM PGY-1, [facility] Intern pager: [phone], text pages welcome

## 2016-02-13 NOTE — Progress Notes (Signed)
Inpatient Diabetes Program Recommendations  AACE/ADA: New Consensus Statement on Inpatient Glycemic Control (2015)  Target Ranges:  Prepandial:   less than 140 mg/dL      Peak postprandial:   less than 180 mg/dL (1-2 hours)      Critically ill patients:  140 - 180 mg/dL   Lab Results  Component Value Date   GLUCAP 68 02/13/2016   HGBA1C 8.2 (H) 10/29/2015    Review of Glycemic Control:  Results for [name], Mark [name] (MRN [id number]) as of 02/13/2016 11:38  Ref. Range 02/12/2016 17:01 02/12/2016 20:27 02/13/2016 00:47 02/13/2016 04:22 02/13/2016 07:22  Glucose-Capillary Latest Ref Range: 65 - 99 mg/dL 281 (H) 286 (H) 242 (H) 162 (H) 68   Diabetes history: Type 2 diabetes Outpatient Diabetes medications: Lantus 26 units daily, Novolog 0-15 units tid with meals Current orders for Inpatient glycemic control:  Lantus 10 units daily, Novolog sensitive q 4 hours  Inpatient Diabetes Program Recommendations:   Please consider changing frequency of Novolog correction to tid with meals and HS.   Thanks, Adah Perl, RN, BC-ADM Inpatient Diabetes Coordinator Pager [phone] (8a-5p)

## 2016-02-13 NOTE — Discharge Summary (Signed)
[facility] Discharge Summary  Patient name: Mark [name] Medical record number: [id number] Date of birth: February 01, 1941 Age: 75 y.o. Gender: male Date of Admission: 02/12/2016  Date of Discharge: 02/17/16 Admitting Physician: Dickie La, MD  Primary Care Provider: Rochel Brome, MD Consultants: Nephrology, Palliative Care  Indication for Hospitalization: Unintentional weight loss  Discharge Diagnoses/Problem List:  Palliative care managment Unintentional weight loss Protein and calorie malnutrition Generalized weakness ESRD on HD (TTS, s/p failed kidney transplant) Resolving uncomplicated acute mild pancreatitis Fungiuria Atrial-fibrillation rate controlled on warfarin Chronic wounds Hypertension Diabetes mellitus type 2 Hypothyroidism  Disposition: SNF  Discharge Condition: Stable, improved  Discharge Exam:  General: frail elderly male,well developed, in no acute distress with non-toxic appearance, laying in bed Heart:irregularly irregularwithout murmurs, rubs, or gallops Lungs: clear to auscultation bilaterally, normal work of breathing Abdomen: +BS, soft, non-tender, non-distended Skin: warm, dry Extremities: warm and well perfused, normal tone, right ecchymosis on shin, left hallux amputation with dry dressing and w/o erythema or purulence, small erosive lesion on the right great toe dorsal surface w/o erythema or purulence, left-sided upper extremity fistula in place with bruit Neuro: A&O to person, place, and time  Brief Hospital Course:  Mark [name] a 75 y.o.malewho presented with weakness and 60-lb weight loss over the course of 3 months. PMH is significant for ESRD (TTS, s/p kidney transplant), HTN, A-fib (rate controlled), DMT2 (s/p left hallux amputation), hypothyroidism, gout, chronic wounds (diabetes and h/o melanoma of back).  Patient presented to [facility] ED on 11/08 with generalized weakness and 60-lb weight loss over 3  months. He experienced episode of emesis prior to receiving HD the day before presentation but was taken off 30-min early. He was brought to ED because the family had difficulty transferring the patient at home due to his weakness and inability to perform ADLs.   During presentation, patient was in a-fib w/o rate-control and appeared dehydrated with elevated lipase and was treated for acute pancreatitis with pain medications and bowel rest. INR was supratheraputic at >10. Nephrology was consulted given need for HD. There initially was concern for acute transplant rejection but this was r/o by renal U/S. Hydration status improved and diet was advanced with resolution of nausea and abdominal pain. INR was brought to therapeutic level and a-fib was rate controlled. Given concern for weight loss, malignancy work up was performed. CT chest/abd/pelvis was consistent for mild pancreatitis, and uncomplicated L-renal transplant, otherwise neg.   Palliative was consulted and determined given decline in health status and previous SNF placement (more detrimental to health than beneficial), with agreement of family, patient was d/c to home under palliative measures in the care of his daughter (designated HCPOA). Palliative recommended starting Remeron, but this caused the patient to have some hallucinations, so it was not continued.  Issues for Follow Up:  1. Palliative now involved in patient care. 2. Lantus was decreased from 26 units daily to 5 units daily. A1c was 6.7%, which is too tightly controlled for this patient. We also stopped his Novolog. Goal A1c should likely be around 8.5%. 3. Pt was noted to have >100,000 colonies of yeast. He was started on Diflucan x 7 days. 4. Pt was noted to have a subtherapeutic INR. He was discharged on Warfarin 1mg  daily. Diflucan is known to interact with Warfarin, so this is likely contributing. Recommend rechecking INR on 11/14 and adjusting dose as appropriate. 5. We  decreased his Synthroid from 140mcg to 130mcg due to a low TSH of  0.126. Can recheck in 4-6 weeks.   Significant Procedures: HD 11/08, 11/10, 11/13  Significant Labs and Imaging:   Recent Labs Lab 02/14/16 0405 02/15/16 0339 02/17/16 1043  WBC 9.9 8.5 6.2  HGB 11.7* 10.9* 11.6*  HCT 35.5* 32.9* 35.1*  PLT 168 179 164    Recent Labs Lab 02/12/16 1525  02/12/16 1829 02/12/16 2011 02/13/16 0339 02/14/16 0405 02/15/16 0339  02/16/16 0348 02/17/16 1043  NA 135  < > 137  --  137 136 136  --  136 134*  K 3.6  < > 3.6  --  3.1* 3.3* 2.7*  2.7*  < > 3.9 3.4*  CL 100*  < > 102  --  103 103 105  --  102 102  CO2 26  --  28  --  25 26 23   --  26 25  GLUCOSE 297*  < > 257*  --  183* 106* 61*  --  159* 210*  BUN 28*  < > 28*  --  31* 16 21*  --  9 15  CREATININE 2.13*  < > 2.07*  --  1.91* 1.35* 1.26*  --  1.09 1.52*  CALCIUM 8.4*  --  8.4*  --  8.2* 8.0* 7.7*  --  7.9* 8.3*  MG  --   --   --  1.3*  --   --  1.2*  --   --   --   PHOS  --   < > 2.8 2.8 2.7 2.4* 2.3*  --  2.2*  --   ALKPHOS 53  --   --   --   --   --   --   --   --   --   AST 31  --   --   --   --   --   --   --   --   --   ALT 13*  --   --   --   --   --   --   --   --   --   ALBUMIN 2.3*  --  2.3*  --  2.0* 1.9*  --   --  1.8*  --   < > = values in this interval not displayed.  DG Chest Port 1 View (02/12/16) FINDINGS: Double lumen central venous catheter in place, unchanged. Heart size and pulmonary vascularity are normal. Lungs are clear. No bone abnormality.  IMPRESSION: No active disease.  US Abdomen Complete (02/13/16) IMPRESSION: 1. Poor visualization of pancreas due to bowel gas. No gross peripancreatic fluid collections. 2. Mildly echogenic liver suggests fatty infiltration 3. Gallbladder is filled with debris/sludge and small stones. Normal wall thickness and normal duct diameter. 4. Spleen and aorta were not able to be evaluated due to bowel gas and patient physical condition. 5.  Nonvisualization of native kidneys. Left lower quadrant renal transplant.  US Renal Transplant w/ Doppler (02/13/16) IMPRESSION: 1. Grossly normal Doppler evaluation of left lower quadrant transplanted kidney. 2. Probable echogenic debris within the bladder with mass felt unlikely.  CT Chest/Abdomen/Pelvis w/o Contrast (02/14/16) IMPRESSION: Chest Impression: 1. Bilateral pleural effusions and mild interstitial edema. 2. No overt pulmonary edema.  No pneumonia. 3. Coronary artery calcification and aortic atherosclerotic calcification.  Abdomen / Pelvis Impression: 1. Inflammation about the pancreatic head and second portion duodenum. Findings are most suggestive of MILD PANCREATITIS versus less likely duodenitis. No organized fluid collections. Pancreas is grossly normal on this noncontrast exam. 2. Sludge versus  small stones within the gallbladder. No gallbladder distension. 3. Transplant in the LEFT iliac fossa without complication on noncontrast exam  Results/Tests Pending at Time of Discharge: none  Discharge Medications:    Medication List    STOP taking these medications   amoxicillin 500 MG capsule Commonly known as:  AMOXIL   ciprofloxacin 500 MG tablet Commonly known as:  CIPRO   doxycycline 100 MG capsule Commonly known as:  VIBRAMYCIN   fenofibrate micronized 134 MG capsule Commonly known as:  LOFIBRA   HYDROcodone-acetaminophen 5-325 MG tablet Commonly known as:  NORCO/VICODIN   insulin aspart 100 UNIT/ML injection Commonly known as:  novoLOG     TAKE these medications   acetaminophen 500 MG tablet Commonly known as:  TYLENOL Take 500 mg by mouth every 6 (six) hours as needed for mild pain or moderate pain.   allopurinol 300 MG tablet Commonly known as:  ZYLOPRIM Take 150 mg by mouth in the morning   feeding supplement Liqd Take 1 Container by mouth 3 (three) times daily with meals.   fluconazole 150 MG tablet Commonly known as:  DIFLUCAN Take  1 tablet (150 mg total) by mouth daily. Start taking on:  02/18/2016   insulin glargine 100 UNIT/ML injection Commonly known as:  LANTUS Inject 0.05 mLs (5 Units total) into the skin at bedtime. What changed:  how much to take   ketoconazole 2 % shampoo Commonly known as:  NIZORAL Apply 1 application topically 2 (two) times a week.   levalbuterol 1.25 MG/0.5ML nebulizer solution Commonly known as:  XOPENEX Take 1.25 mg by nebulization every 6 (six) hours as needed for wheezing or shortness of breath.   levothyroxine 125 MCG tablet Commonly known as:  SYNTHROID, LEVOTHROID Take 1 tablet (125 mcg total) by mouth daily before breakfast. Start taking on:  02/18/2016 What changed:  medication strength  how much to take   metoprolol succinate 50 MG 24 hr tablet Commonly known as:  TOPROL-XL Take 50 mg by mouth daily.   multivitamin Tabs tablet Take 1 tablet by mouth at bedtime.   nystatin powder Generic drug:  nystatin Apply 1 g topically daily as needed (irritation).   pantoprazole 40 MG tablet Commonly known as:  PROTONIX Take 1 tablet (40 mg total) by mouth 2 (two) times daily.   predniSONE 5 MG tablet Commonly known as:  DELTASONE Take 5 mg by mouth daily.   PROGRAF 0.5 MG capsule Generic drug:  tacrolimus Take 0.5 mg by mouth 2 (two) times daily.   rosuvastatin 5 MG tablet Commonly known as:  CRESTOR Take 5 mg by mouth every evening.   tamsulosin 0.4 MG Caps capsule Commonly known as:  FLOMAX Take 1 capsule (0.4 mg total) by mouth daily after supper.   vitamin B-12 100 MCG tablet Commonly known as:  CYANOCOBALAMIN Take 100 mcg by mouth daily.   warfarin 1 MG tablet Commonly known as:  COUMADIN Take 1mg  daily. Recheck INR on 11/14 and adjust dose as appropriate. What changed:  medication strength  how much to take  how to take this  when to take this  additional instructions       Discharge Instructions: Please refer to Patient Instructions  section of EMR for full details.  Patient was counseled important signs and symptoms that should prompt return to medical care, changes in medications, dietary instructions, activity restrictions, and follow up appointments.   Follow-Up Appointments: Follow-up Information    COX,KIRSTEN, MD Follow up.   Specialties:  Family  Medicine, Interventional Cardiology, Radiology, Anesthesiology Why:  Please call to schedule hopsital follow-up appointment in the next 1-2 weeks Contact information: [place] [address] [place], MD 02/17/2016, 1:23 PM PGY-2, [place]

## 2016-02-13 NOTE — Consult Note (Signed)
[facility] Nurse wound consult note Reason for Consult: LE and thoracic spine Has a history of melanoma on his back but I am not able appreciate any wounds on the thoracic spine today The bedside nurse reports an area on the gluteal cleft that is treated with a sacral dressing Wound type: chronic non healing wounds right great toe and left great toe amputation site. He is followed by the wound care center in [place] for these.  Measurement: Right great toe: 0.3cm x 0.2cm x 0.1cm  Left great toe amputation site: 0.3cm x 0.5cm x 0.2cm  Wound bed: both sites are mostly healed with some fibrin, no real depth Drainage (amount, consistency, odor) scant, no odor Periwound: intact, dry skin Dressing procedure/placement/frequency: Continue POC per wound care center, silver hydrofiber every other day.  Follow up with wound care center at dc.  Discussed POC with patient and bedside nurse.  Re consult if needed, will not follow at this time. Thanks  Mekai Wilkinson [employer], RN,CWOCN, CNS [phone])

## 2016-02-13 NOTE — Progress Notes (Addendum)
CRITICAL VALUE ALERT  Critical value received: INR 4.36   Date of notification:  02/13/2016  Time of notification:  0700  Critical value read back: yes  Nurse who received alert:  Dorise Bullion  MD notified (1st page):  Teaching svc   Time of first page:  0707  MD notified (2nd page):  Time of second page:  Responding MD:  Teaching svc  Time MD responded: [phone]  No changes made

## 2016-02-13 NOTE — Progress Notes (Signed)
OT Cancellation Note  Patient Details Name: Mark [name] MRN: [id number] DOB: 20-May-1940   Cancelled Treatment:    Reason Eval/Treat Not Completed: Patient at procedure or test/ unavailable  Britt Bottom 02/13/2016, 11:36 AM

## 2016-02-13 NOTE — Progress Notes (Signed)
ANTICOAGULATION CONSULT NOTE - Follow Up Consult  Pharmacy Consult for Coumadin Indication: atrial fibrillation  Allergies  Allergen Reactions  . Tape Other (See Comments)    SKIN WILL TEAR!!    Patient Measurements: Height: 5\' 9"  (175.3 cm) Weight: 175 lb 0.7 oz (79.4 kg) IBW/kg (Calculated) : 70.7  Vital Signs: Temp: 98.2 F (36.8 C) (11/09 0815) Temp Source: Oral (11/09 0815) BP: 131/63 (11/09 1100) Pulse Rate: 88 (11/09 1100)  Labs:  Recent Labs  02/12/16 1525 02/12/16 1532 02/12/16 1829 02/13/16 0339  HGB 12.9* 13.6  --  12.2*  HCT 39.7 40.0  --  36.6*  PLT 235  --   --  241  LABPROT >90.0*  --   --  42.8*  INR >10.00*  --   --  4.36*  CREATININE 2.13* 2.10* 2.07* 1.91*    Estimated Creatinine Clearance: 33.4 mL/min (by C-G formula based on SCr of 1.91 mg/dL (H)).   Medications:  Scheduled:  . Chlorhexidine Gluconate Cloth  6 each Topical Q0600  . feeding supplement  1 Container Oral TID WC  . insulin aspart  0-9 Units Subcutaneous Q4H  . insulin glargine  10 Units Subcutaneous QHS  . levothyroxine  137 mcg Oral QAC breakfast  . metoprolol succinate  50 mg Oral Daily  . multivitamin  1 tablet Oral QHS  . mupirocin ointment  1 application Nasal BID  . pantoprazole  40 mg Oral BID  . predniSONE  5 mg Oral Daily  . rosuvastatin  5 mg Oral QPM  . sodium chloride flush  3 mL Intravenous Q12H  . tacrolimus  0.5 mg Oral BID  . tamsulosin  0.4 mg Oral QPC supper  . vitamin B-12  100 mcg Oral Daily    Assessment: 75yo male with AFib, admitted with INR > 10 on home dose of Coumadin.  Coumadin care note from 01/29/16 notes INR 1.3 and dosage rec x 5 days & repeat INR on 10/30 of 2.9, but no indication of further dosing.  I have contacted outpt MD managing Coumadin to determine most recent dose. INR this AM with significant drop after Vit K 5mg  IV x 1 last PM, to 4.36 this AM.  No bleeding noted.  CBC stable.  Goal of Therapy:  INR 2-3 Monitor platelets by  anticoagulation protocol: Yes   Plan:  Awaiting call from outpt Coumadin clinic Daily INR  [name], PharmD [facility]

## 2016-02-13 NOTE — Progress Notes (Signed)
Initial Nutrition Assessment  DOCUMENTATION CODES:   Severe malnutrition in context of chronic illness  INTERVENTION:   - Continue Boost Breeze oral nutrition supplement TID. Each provides 250 kcal and 9 grams protein. - Encourage PO intake.  NUTRITION DIAGNOSIS:   Malnutrition related to chronic illness as evidenced by percent weight loss of 23% in 5 months, mild depletion of body fat, severe depletions of muscle mass.  GOAL:   Patient will meet greater than or equal to 90% of their needs  MONITOR:   Supplement acceptance, PO intake, Diet advancement, Labs, Weight trends, I & O's, Skin  REASON FOR ASSESSMENT:   Consult, Malnutrition Screening Tool  (Protein calorie malnutrition)  ASSESSMENT:   75 y.o. male presenting with weakness and weight loss 2/2 poor oral intake over last 3 months. PMH is significant for ESRD (TTS, s/p kidney transplant), HTN, A-fib (rate controlled), DMT2 (s/p left hallux amputation), hypothyroidism, gout, chronic wounds (diabetes and melanoma of back).  Spoke with pt at bedside who seemed disoriented and able to provide limited nutrition and weight history. Per pt, he "does not want to eat" at any time during the day. Pt reports is UBW as 214#. Pt unsure when he last weighed this. Per weight history, pt has lost 52# in past 5 months (23% weight loss, significant for timeframe).  Pt reports consuming 2 Boost oral nutrition supplements PTA. Suggested pt try consuming Boost TID after discharge. Pt states he is unsure whether he can do this.  Medications reviewed and include sliding scale Novolog, 10 units Lantus daily, 125 mcg Synthroid, renal MVI daily, 40 mg Protonix daily, 5 mg prednisone daily, 100 mcg vitamin B-12 daily, PRN heparin, PRN Zofran, PRN Miralax  Labs reviewed and include low potassium (3.2 mmol/L), elevated BUN (31 mg/dL), elevated creatinine (1.91 mg/dL), low calcium (8.2 mg/dL), low magnesium (1.3 mg/dL), phosphorus WNL CBG's: 68-286  mg/dL, trending down  NFPE: Exam completed. Mild fat depletion, severe muscle depletion, and mild edema noted.  Diet Order:  Diet clear liquid Room service appropriate? Yes; Fluid consistency: Thin  Skin:  Wound (see comment) (left great toe amputation wound, MSAD buttocks)  Last BM:  02/13/16  Height:   Ht Readings from Last 1 Encounters:  02/12/16 5\' 9"  (1.753 m)    Weight:   Wt Readings from Last 1 Encounters:  02/13/16 175 lb 0.7 oz (79.4 kg)    Ideal Body Weight:  72.7 kg  BMI:  Body mass index is 25.85 kg/m.  Estimated Nutritional Needs:   Kcal:  2000-2200 (26-28 kcal/kg)  Protein:  105-120 grams (1.3-1.5 g/dY)  Fluid:  2.0 L/day  EDUCATION NEEDS:   No education needs identified at this time  Jeb Levering Dietetic Intern Pager Number: [phone]

## 2016-02-13 NOTE — Progress Notes (Signed)
PT Cancellation Note  Patient Details Name: Mark [name] MRN: [id number] DOB: 08/22/1940   Cancelled Treatment:    Reason Eval/Treat Not Completed: Patient at procedure or test/unavailable.   Patient in HD this am.  Will return as time allows today, or tomorrow for PT evaluation.   Despina Pole 02/13/2016, 12:00 PM Carita Pian Sanjuana Kava, Lydia Pager [phone]

## 2016-02-13 NOTE — Progress Notes (Signed)
Patient ID: Mark [name], male   DOB: Feb 04, 1941, 75 y.o.   MRN: [id number]  [facility] Progress Note    Subjective:   Patient was seen on dialysis and the procedure was supervised. BFR 400 Via RIJ tdc BP is 167/82.  Patient appears to be tolerating treatment well with improvement of abdominal pain.    Objective:   BP (!) 167/82 (BP Location: Right Arm)   Pulse 99   Temp 98.6 F (37 C) (Oral)   Resp 20   Ht 5\' 9"  (1.753 m)   Wt 73 kg (161 lb)   SpO2 100%   BMI 23.78 kg/m   Intake/Output: I/O last 3 completed shifts: In: [id number] [P.O.:600; I.V.:475; IV Piggyback:300] Out: 100 [Urine:100]   Intake/Output this shift:  No intake/output data recorded. Weight change:   Physical Exam: [id number] WM in NAD CVS:no rub Resp:cta Abd:+BS, soft, mild tenderness, no guarding or rebound, minimal pain over renal allograft in LLQ Ext:no edema, LAVF +T/B  Labs: BMET  Recent Labs Lab 02/12/16 1525 02/12/16 1532 02/12/16 1829 02/12/16 2011 02/13/16 0339  NA 135 138 137  --  137  K 3.6 3.5 3.6  --  3.1*  CL 100* 98* 102  --  103  CO2 26  --  28  --  25  GLUCOSE 297* 283* 257*  --  183*  BUN 28* 29* 28*  --  31*  CREATININE 2.13* 2.10* 2.07*  --  1.91*  ALBUMIN 2.3*  --  2.3*  --  2.0*  CALCIUM 8.4*  --  8.4*  --  8.2*  PHOS  --   --  2.8 2.8 2.7   CBC  Recent Labs Lab 02/12/16 1525 02/12/16 1532 02/13/16 0339  WBC 10.1  --  10.8*  NEUTROABS 7.7  --   --   HGB 12.9* 13.6 12.2*  HCT 39.7 40.0 36.6*  MCV 88.4  --  88.4  PLT 235  --  241    @IMGRELPRIORS @ Medications:    . Chlorhexidine Gluconate Cloth  6 each Topical Q0600  . feeding supplement  1 Container Oral TID WC  . insulin aspart  0-9 Units Subcutaneous Q4H  . insulin glargine  10 Units Subcutaneous QHS  . levothyroxine  137 mcg Oral QAC breakfast  . metoprolol succinate  50 mg Oral Daily  . multivitamin  1 tablet Oral QHS  . mupirocin ointment  1 application Nasal BID  .  pantoprazole  40 mg Oral BID  . rosuvastatin  5 mg Oral QPM  . sodium chloride flush  3 mL Intravenous Q12H  . tacrolimus  0.5 mg Oral BID  . tamsulosin  0.4 mg Oral QPC supper  . vitamin B-12  100 mcg Oral Daily   Dialyzes at [facility] on TTS since July 2017 (after failed transplant).  Primary Nephrologist Dr. Marval Regal EDW [phone] (left at 77kg on Tues) [facility] 4/2.25 Heparin 3600 unit bolus Access RIJ cath; lt BCF placed July 2017  Assessment/ Plan:   1. Abdominal pain- ddx pancreatitis, acute rejection of transplanted kidney, ischemic bowel (after leaving below edw on HD). 1. He had elevated lipase yesterday but also had large leukocytes with some blood on UA.  Await tx [place]. 2. Bowel rest for now and repeat amylase/lipase today. 3. Will also send UA for cx and sensitivity given large leukocytes. 4. May need pulse solumedrol pending [place] results. 2. ESRD Continue with TTS schedule but no UF today 3. 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 Nutrition: protein malnutrition.  Cont with renal diet when able to tolerate po 6. Hypertension: stable 7. Vascular access- LUE AVF +T/B placed 11/19/15, not yet mature 8. DM per primary svc  Donetta Potts, MD [place] Pager [phone] 02/13/2016, 8:30 AM

## 2016-02-14 ENCOUNTER — Encounter (HOSPITAL_COMMUNITY): Payer: Medicare Other

## 2016-02-14 ENCOUNTER — Inpatient Hospital Stay (HOSPITAL_COMMUNITY): Payer: Medicare Other

## 2016-02-14 ENCOUNTER — Ambulatory Visit: Payer: Medicare Other | Admitting: Endocrinology

## 2016-02-14 ENCOUNTER — Ambulatory Visit: Payer: Medicare Other | Admitting: Family

## 2016-02-14 DIAGNOSIS — R1084 Generalized abdominal pain: Secondary | ICD-10-CM

## 2016-02-14 DIAGNOSIS — R109 Unspecified abdominal pain: Secondary | ICD-10-CM

## 2016-02-14 DIAGNOSIS — E43 Unspecified severe protein-calorie malnutrition: Secondary | ICD-10-CM | POA: Insufficient documentation

## 2016-02-14 DIAGNOSIS — I4891 Unspecified atrial fibrillation: Secondary | ICD-10-CM

## 2016-02-14 DIAGNOSIS — R531 Weakness: Secondary | ICD-10-CM

## 2016-02-14 DIAGNOSIS — N186 End stage renal disease: Secondary | ICD-10-CM

## 2016-02-14 LAB — GLUCOSE, CAPILLARY
GLUCOSE-CAPILLARY: 117 mg/dL — AB (ref 65–99)
GLUCOSE-CAPILLARY: 295 mg/dL — AB (ref 65–99)
Glucose-Capillary: 97 mg/dL (ref 65–99)
Glucose-Capillary: 107 mg/dL — ABNORMAL HIGH (ref 65–99)
Glucose-Capillary: 221 mg/dL — ABNORMAL HIGH (ref 65–99)
Glucose-Capillary: 293 mg/dL — ABNORMAL HIGH (ref 65–99)

## 2016-02-14 LAB — RENAL FUNCTION PANEL
ALBUMIN: 1.9 g/dL — AB (ref 3.5–5.0)
Anion gap: 7 (ref 5–15)
BUN: 16 mg/dL (ref 6–20)
CALCIUM: 8 mg/dL — AB (ref 8.9–10.3)
CO2: 26 mmol/L (ref 22–32)
Chloride: 103 mmol/L (ref 101–111)
Creatinine, Ser: 1.35 mg/dL — ABNORMAL HIGH (ref 0.61–1.24)
GFR calc Af Amer: 58 mL/min — ABNORMAL LOW (ref >60)
GFR, EST NON AFRICAN AMERICAN: 50 mL/min — AB (ref >60)
GLUCOSE: 106 mg/dL — AB (ref 65–99)
PHOSPHORUS: 2.4 mg/dL — AB (ref 2.5–4.6)
POTASSIUM: 3.3 mmol/L — AB (ref 3.5–5.1)
SODIUM: 136 mmol/L (ref 135–145)

## 2016-02-14 LAB — CBC
HEMATOCRIT: 35.5 % — AB (ref 39.0–52.0)
HEMOGLOBIN: 11.7 g/dL — AB (ref 13.0–17.0)
MCH: 29.3 pg (ref 26.0–34.0)
MCHC: 33 g/dL (ref 30.0–36.0)
MCV: 88.8 fL (ref 78.0–100.0)
Platelets: 168 10*3/uL (ref 150–400)
RBC: 4 MIL/uL — ABNORMAL LOW (ref 4.22–5.81)
RDW: 17.3 % — AB (ref 11.5–15.5)
WBC: 9.9 10*3/uL (ref 4.0–10.5)

## 2016-02-14 LAB — PROTIME-INR
INR: 1.81
PROTHROMBIN TIME: 21.2 s — AB (ref 11.4–15.2)

## 2016-02-14 LAB — HEMOGLOBIN A1C
Hgb A1c MFr Bld: 6.7 % — ABNORMAL HIGH (ref 4.8–5.6)
Mean Plasma Glucose: 146 mg/dL

## 2016-02-14 MED ORDER — WARFARIN SODIUM 2.5 MG PO TABS
2.5000 mg | ORAL_TABLET | Freq: Once | ORAL | Status: AC
  Administered 2016-02-14: 2.5 mg via ORAL
  Filled 2016-02-14: qty 1

## 2016-02-14 MED ORDER — WARFARIN - PHARMACIST DOSING INPATIENT: Freq: Every day | Status: DC

## 2016-02-14 MED ORDER — IOPAMIDOL (ISOVUE-300) INJECTION 61%
15.0000 mL | INTRAVENOUS | Status: AC
  Administered 2016-02-14: 15 mL via ORAL

## 2016-02-14 MED ORDER — FLUCONAZOLE 150 MG PO TABS
150.0000 mg | ORAL_TABLET | Freq: Every day | ORAL | Status: DC
  Administered 2016-02-14 – 2016-02-17 (×4): 150 mg via ORAL
  Filled 2016-02-14 (×4): qty 1

## 2016-02-14 NOTE — Progress Notes (Signed)
Patient ID: AOI FREHNER, male   DOB: [birth year], 75 y.o.   MRN: [id number]  [facility] Progress Note    Subjective:   Feeling better, no N/V/adominal pain   Objective:   BP 130/78 (BP Location: Right Arm)   Pulse (!) 110   Temp 98.2 F (36.8 C) (Oral)   Resp 20   Ht 5\' 9"  (1.753 m)   Wt 75.1 kg (165 lb 9.1 oz)   SpO2 93%   BMI 24.45 kg/m   Intake/Output: I/O last 3 completed shifts: In: [id number] [P.O.:840; I.V.:475; IV Piggyback:50] Out: 300 [Urine:300]   Intake/Output this shift:  Total I/O In: -  Out: 200 [Urine:200] Weight change: 6.824 kg (15 lb 0.7 oz)  Physical Exam: Gen:WD frail WM in NAD CVS: tachy no rub Resp:cta [id number], no tenderness over LLQ renal transplant Ext:no edema  Labs: BMET  Recent Labs Lab 02/12/16 1525 02/12/16 1532 02/12/16 1829 02/12/16 2011 02/13/16 0339 02/14/16 0405  NA 135 138 137  --  137 136  K 3.6 3.5 3.6  --  3.1* 3.3*  CL 100* 98* 102  --  103 103  CO2 26  --  28  --  25 26  GLUCOSE 297* 283* 257*  --  183* 106*  BUN 28* 29* 28*  --  31* 16  CREATININE 2.13* 2.10* 2.07*  --  1.91* 1.35*  ALBUMIN 2.3*  --  2.3*  --  2.0* 1.9*  CALCIUM 8.4*  --  8.4*  --  8.2* 8.0*  PHOS  --   --  2.8 2.8 2.7 2.4*   CBC  Recent Labs Lab 02/12/16 1525 02/12/16 1532 02/13/16 0339 02/14/16 0405  WBC 10.1  --  10.8* 9.9  NEUTROABS 7.7  --   --   --   HGB 12.9* 13.6 12.2* 11.7*  HCT 39.7 40.0 36.6* 35.5*  MCV 88.4  --  88.4 88.8  PLT 235  --  241 168    @IMGRELPRIORS @ Medications:    . Chlorhexidine Gluconate Cloth  6 each Topical Q0600  . feeding supplement  1 Container Oral TID WC  . insulin aspart  0-9 Units Subcutaneous Q4H  . insulin glargine  10 Units Subcutaneous QHS  . levothyroxine  125 mcg Oral QAC breakfast  . metoprolol succinate  50 mg Oral Daily  . multivitamin  1 tablet Oral QHS  . mupirocin ointment  1 application Nasal BID  . pantoprazole  40 mg Oral BID  . predniSONE  5 mg Oral  Daily  . rosuvastatin  5 mg Oral QPM  . sodium chloride flush  3 mL Intravenous Q12H  . tacrolimus  0.5 mg Oral BID  . tamsulosin  0.4 mg Oral QPC supper  . vitamin B-12  100 mcg Oral Daily   Dialyzes at [place] July 2017 (after failed transplant).  Primary Nephrologist Dr. Marval Regal EDW [phone] (left at 77kg on Tues) [facility] 4/2.25 Heparin 3600 unit bolus Access RIJ cath; lt BCF placed July 2017  Assessment/ Plan:   1. Abdominal pain- ddx pancreatitis, acute rejection of transplanted kidney, ischemic bowel (after leaving below edw on HD). 1. He had elevated lipase yesterday but also had large leukocytes with some blood on UA. 2. Bowel rest for now and repeat amylase/lipase today. 3. Will also send UA for cx and sensitivity given large leukocytes. 4. [place] of renal transplant was without any evidence of rejection and no peri-nephric fluid but did have debris in bladder. 5. Amylase and  lipase elevated but abdominal US unrevealing for pancreatitis due to overlying bowel gas and will likely need CT scan or MRI to further evaluate pancreas as acute rejection not supported by [place] 2. ESRD Continue with TTS schedule but no UF 02/13/16. 3. Anemia: hold ESA due to hgb >12 4. 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 Nutrition: protein malnutrition.  Cont with renal diet when able to tolerate po 7. Hypertension: stable 8. Vascular access- LUE AVF +T/B placed 11/19/15, not yet mature 9. DM per primary svc   Donetta Potts, MD [place] Pager [phone] 02/14/2016, 10:38 AM

## 2016-02-14 NOTE — Care Management Important Message (Signed)
Important Message  Patient Details  Name: Mark [name] MRN: [id number] Date of Birth: Aug 11, 1940   Medicare Important Message Given:  Yes    Bethena Roys, RN 02/14/2016, 12:42 PM

## 2016-02-14 NOTE — Evaluation (Addendum)
Physical Therapy Evaluation Patient Details Name: Mark [name] MRN: [id number] DOB: [birth year] Today's Date: 02/14/2016   History of Present Illness  75 y.o. male admitted with malnutrition, weight loss adn extreme weakness.  Pt with h/o L great toe amputation. PMH significant HTN, afib on Coumadin, ESRD s/p kidney tranplant 12 years ago, and DM type II.HD M,W,F  Clinical Impression  Pt admitted with above diagnosis and presents to PT with functional limitations due to deficits listed below (See PT problem list). Pt needs skilled PT to maximize independence and safety to allow discharge to SNF. Pt does not seem pleased with his quality of life. Recommend Palliative Medicine consult. Currently pt too weak to return home. Pt anxious about mobility and falling. Pt felt much more secure and safe using Denna Haggard for standing.     Follow Up Recommendations SNF    Equipment Recommendations  None recommended by PT    Recommendations for Other Services       Precautions / Restrictions Precautions Precautions: Fall;Other (comment) (wounds) Precaution Comments: goes to wound care center in  Required Braces or Orthoses: Other Brace/Splint Other Brace/Splint: darco shoe for lt  foot Restrictions Weight Bearing Restrictions: No      Mobility  Bed Mobility Overal bed mobility: Needs Assistance Bed Mobility: Sit to Supine;Supine to Sit     Supine to sit: Mod assist Sit to supine: Mod assist;+2 for physical assistance   General bed mobility comments: Assist to elevate trunk into sitting. Assist to lower trunk and bring legs up into bed to return to supine.  Transfers Overall transfer level: Needs assistance Equipment used: Ambulation equipment used Denna Haggard) Transfers: Sit to/from Stand Sit to Stand: Mod assist;+2 safety/equipment         General transfer comment: Assist to bring hips and trunk up. Performed x 2 and able to achieve full upright standing on Denna Haggard with min A to maintain. Stood 1 1/2 minutes on first attempt and 1 minute on second attempt. HR to 140's with standing  Ambulation/Gait                Stairs            Wheelchair Mobility    Modified Rankin (Stroke Patients Only)       Balance Overall balance assessment: Needs assistance Sitting-balance support: Bilateral upper extremity supported;Feet supported Sitting balance-Leahy Scale: Poor Sitting balance - Comments: UE support and min guard for sitting EOB Postural control: Right lateral lean Standing balance support: Bilateral upper extremity supported Standing balance-Leahy Scale: Poor Standing balance comment: Stood with Denna Haggard with min A to maintain once upright                             Pertinent Vitals/Pain Pain Assessment: No/denies pain    Home Living Family/patient expects to be discharged to:: Private residence Living Arrangements: Children Available Help at Discharge: Family;Personal care attendant;Other (Comment) (has 24/7 coverage wtih one person) Type of Home: House Home Access: Ramped entrance     Home Layout: Two level;Able to live on main level with bedroom/bathroom Home Equipment: Walker - 4 wheels;Shower seat;Walker - 2 wheels;Cane - single point;Grab bars - toilet;Grab bars - tub/shower;Hand held shower head Additional Comments: all bathrooms have been made handicap accessible.    Prior Function Level of Independence: Needs assistance   Gait / Transfers Assistance Needed: uses primarily the w/c in the house now/walker on occasion but not last  two weeks b/c of weakness.    ADL's / Homemaking Assistance Needed: hired help for all cooking/cleaning.  Aid now assisting with all adls the last 2-3 weeks.         Hand Dominance   Dominant Hand: Right    Extremity/Trunk Assessment   Upper Extremity Assessment: Defer to OT evaluation           Lower Extremity Assessment: Generalized weakness       Cervical / Trunk Assessment: Other exceptions;Kyphotic  Communication   Communication: No difficulties  Cognition Arousal/Alertness: Awake/alert Behavior During Therapy: WFL for tasks assessed/performed Overall Cognitive Status: Within Functional Limits for tasks assessed                      General Comments      Exercises     Assessment/Plan    PT Assessment Patient needs continued PT services  PT Problem List Decreased strength;Decreased activity tolerance;Decreased balance;Decreased mobility;Decreased knowledge of use of DME          PT Treatment Interventions DME instruction;Gait training;Functional mobility training;Therapeutic activities;Therapeutic exercise;Balance training;Patient/family education    PT Goals (Current goals can be found in the Care Plan section)  Acute Rehab PT Goals Patient Stated Goal: to get stronger PT Goal Formulation: With patient Time For Goal Achievement: 02/28/16 Potential to Achieve Goals: Good    Frequency Min 2X/week   Barriers to discharge        Co-evaluation               End of Session Equipment Utilized During Treatment: Gait belt Denna Haggard) Activity Tolerance: Patient limited by fatigue Patient left: in bed;with call bell/phone within reach Nurse Communication: Mobility status;Need for lift equipment         Time: [id number] PT Time Calculation (min) (ACUTE ONLY): 21 min   Charges:   PT Evaluation $PT Eval Moderate Complexity: 1 Procedure     PT G Codes:        Alyxandra Tenbrink 2016/03/03, 1:54 PM [place] [phone]

## 2016-02-14 NOTE — Progress Notes (Signed)
Family Medicine Teaching Service Daily Progress Note Intern Pager: [phone]  Patient name: Mark [name] Medical record number: [id number] Date of birth: [birth year] Age: 75 y.o. Gender: male  Primary Care Provider: Rochel Brome, MD Consultants: Nephrology Code Status: DNR (confirmed during admission)  Pt Overview and Major Events to Date:  11/08: Admit for unexplained 60 lb weight loss and weakness for 3 months 11/09: HD  Assessment and Plan: [name] is a 75 y.o. male presenting with weakness and weight loss 2/2 poor oral intake over last 3 months. PMH is significant for ESRD (TTS, s/p kidney transplant), HTN, A-fib (rate controlled), DMT2 (s/p left hallux amputation), hypothyroidism, gout, chronic wounds (diabetes and melanoma of back).  #Weakness, Subacute, Progressive: Patient says weakness has been problem since he began loosing weight 3 months ago. Etiology was thought to be possible acute transplant rejection per nephrology note on 11/08 given tenderness at transplant site, though U/S did not show hydronephrosis or mass, therefore unlikely, discussed this with nephrology over phone. DDx also includes dialysis versus protein calorie malnutrition versus dehydration versus deconditioning versus infectious. Has history of hypothyroidism, TSH low 0.126. Weight on admission 160 lbs compared to 228 lbs on 10/2015. Has worsened over past 2 days limiting his ability to ambulate. Denies h/o bleeding from fistula or rectal bleeding. Denies symptoms of stroke. No chest pain, shortness of breath, or new peripheral edema suggestive of new heart or liver disease. UA with mod Hgb and large leuks with rare bacteria on micro with yeast present. --Gentle IV fluids --UCx pending --PT/OT consult pending --Trend CBC, Hgb stable --Discussed with Nephrology concern for malignancy given melenoma hx, will obtain CT chest/abd/pelvis w/o contrast  #Elevated Lipase, Acute, Possible  Pancreatitis: Lipase 341 on admission. Suggestive of acute pancreatitis. Patient has experienced emesis but only one brief episode at home day prior to admission. Endorsed pain under naval near transplant site but no epigastric tenderness on exam. Exam was was not concerning for acute abdomen. He denies alcohol use or drug use. Patient is on Crestor at home as well as [place]. Lipid panel wnl on 11/09. Abdominal U/S unable to visualize pancrease due to bowel gas. --Clear liquid diet --Patient will need to stop [place] while on dialysis and should be stopped on d/c --Monitor I&O --Gentle fluids NS @50cc /hr  #Protein and Calorie Malnutrition/Weight Loss, Acute, Worsening: Likely 2/2 to protein and calorie malnourishment. Uncertain source for decreased oral intake. Never smoker. Patient seems to have minimal appetite and supplements with boost shakes. Weight on admission 160 lbs Compared to 228 lbs on 10/2015. Drop in weight seems to be aligned with initiation of dialysis, but cannot rule out other sources at this time (neoplasm, chronic infection, endocrine).  --Clear liquid diet, advance as tolerated --Boost supplements TID --Monitor I&Os --Daily weights --Nutrition consult pending  #ESRD (HD TTS), with Azotemia, S/p Kidney Transplant: Patient sees [facility] in [place]. Says he received dialysis day before admission but missed last 30 mins of session. Has kidney transplant which failed 2/2 CT contrast dye in past. Takes tacrolimus and prednisone 5 mg QD at home. Left arm AV fistula not yet mature. Renal transplant U/S w/o evidence of mass or hydronephrosis. --Continue home tacrolimus 0.5 mg BID --Continue home Prednisone 5 mg QD --Nephrology consulted, appreciate recs, HD 11/09, do not believe acute rejection of transplant is likely given neg U/S --F/u CBC, renal panel, mag, phos in AM --Consider pulsed solumedrol --Will need to determine cancer screenings  #A-Fib, Supratheraputic  INR on Warfarin, Acute,  Uncontrolled: Currently asymptomatic. Blood pressure stable. INR measured >10 on admission, vitamin K 5 mg IV given. On coumadin for a-fib. Patient uncertain on his coumadin dose but denies recent history of bleeds or bloody stools. INR 1.81 on 11/10. --Holding coumadin --Coumadin per pharmacy --F/u CBC and INR  #Chronic Wounds/H/o Melanoma Removal of Back, Chronic, Stable: Sees wound care in [facility] for chronic lower extremity wounds. Melanoma removed 35 years ago on upper back. No evidence concerning for cellulitis or deep tissue infection at this time. --Wound care consult  #Hyeprtension, Chronic, Stable: Hypertensive at 150-170s/90s. Takes Toprolol 50 mg QD at home. --Toprolol 50 mg QD  #Diabetes Mellitus Type 2, Chronic, Stable: CBGs 90-100s overnight. Takes 26 U Lantus, and 0-15 U Novolog TID at home. A1c 6.7 on 11/08. --Lantus 10 U QD --Sensitive SSI --Monitor CBGs  #Gout, Chronic, Controlled: Takes allopurinol 150 mg QD. Uric acid low at 0.7. --Holding allopurinol for now given need for HD, consider restarting  #Hypothyroidism, Chronic: TSH low 0.126. Takes synthroid 137 mcg QD.  --Synthroid to 125 mcg QD, and continue this dose on d/c  #BPH, Chronic: BP stable. On Flomax at home. --Flomax 0.4 mg QD  #Funguria, Acute, Asymptomatic: Discovered on UA. --Diflucan 150 mg PO (day 1 of 7)  FEN/GI: Clear liquid diet, Boost supplements TID, Protonix 40 mg BID Prophylaxis: Supratheraputic INR  Disposition: Pending improvement of weakness, INR, and source of weight loss  Subjective:  Patient sitting up in bed without c/o pain or nausea. Denies SOB or CP. Says he does not want to eat his breakfast. Says his weakness has improved some.  Objective: Temp:  [97.9 F (36.6 C)-98.4 F (36.9 C)] 98.2 F (36.8 C) (11/10 0430) Pulse Rate:  [86-110] 110 (11/10 0430) Resp:  [20-27] 20 (11/10 0430) BP: (119-167)/(58-82) 130/78 (11/10 0430) SpO2:   [93 %-100 %] 93 % (11/10 0430) Weight:  [165 lb 9.1 oz (75.1 kg)-175 lb 0.7 oz (79.4 kg)] 165 lb 9.1 oz (75.1 kg) (11/10 0500) Physical Exam: General: frail elderly male, well developed, in no acute distress with non-toxic appearance HEENT: normocephalic, atraumatic, moist mucous membranes, white sclera Neck: supple, non-tender without lymphadenopathy CV: irregularly irregular without murmurs, rubs, or gallops Lungs: clear to auscultation bilaterally with normal work of breathing Abdomen: soft, no tenderness at renal transplanted kidney, normoactive bowel sounds Skin: warm, dry, cap refill <2 seconds Extremities: warm and well perfused, normal tone, right ecchymosis on shin, left hallux amputation with dry dressing and w/o erythema or purulence, small erosive lesion on the right great toe dorsal surface w/o erythema or purulence, left-sided upper extremity fistula in place with bruit Neuro: A&O to person, place, and time  Laboratory:  Recent Labs Lab 02/12/16 1525 02/12/16 1532 02/13/16 0339 02/14/16 0405  WBC 10.1  --  10.8* 9.9  HGB 12.9* 13.6 12.2* 11.7*  HCT 39.7 40.0 36.6* 35.5*  PLT 235  --  241 168    Recent Labs Lab 02/12/16 1525  02/12/16 1829 02/13/16 0339 02/14/16 0405  NA 135  < > 137 137 136  K 3.6  < > 3.6 3.1* 3.3*  CL 100*  < > 102 103 103  CO2 26  --  28 25 26   BUN 28*  < > 28* 31* 16  CREATININE 2.13*  < > 2.07* 1.91* 1.35*  CALCIUM 8.4*  --  8.4* 8.2* 8.0*  PROT 5.8*  --   --   --   --   BILITOT 1.0  --   --   --   --  ALKPHOS 53  --   --   --   --   ALT 13*  --   --   --   --   AST 31  --   --   --   --   GLUCOSE 297*  < > 257* 183* 106*  < > = values in this interval not displayed. Imaging/Diagnostic Tests: DG Chest Port 1 View (02/12/16) FINDINGS: Double lumen central venous catheter in place, unchanged. Heart size and pulmonary vascularity are normal. Lungs are clear. No bone abnormality.  IMPRESSION: No active disease.  US Abdomen  Complete (02/13/16) IMPRESSION: 1. Poor visualization of pancreas due to bowel gas. No gross peripancreatic fluid collections. 2. Mildly echogenic liver suggests fatty infiltration 3. Gallbladder is filled with debris/sludge and small stones. Normal wall thickness and normal duct diameter. 4. Spleen and aorta were not able to be evaluated due to bowel gas and patient physical condition. 5. Nonvisualization of native kidneys. Left lower quadrant renal transplant.  US Renal Transplant w/ Doppler (02/13/16) IMPRESSION: 1. Grossly normal Doppler evaluation of left lower quadrant transplanted kidney. 2. Probable echogenic debris within the bladder with mass felt unlikely.  Franklin Bing, DO 02/14/2016, 7:13 AM PGY-1, [place] Intern pager: [phone], text pages welcome

## 2016-02-14 NOTE — Evaluation (Signed)
Occupational Therapy Evaluation Patient Details Name: Mark [name] MRN: [id number] DOB: 10-31-1940 Today's Date: 02/14/2016    History of Present Illness 75 y.o. male admitted with malnutrition, weight loss adn extreme weakness.  Pt with h/o L great toe amputation. PMH significant HTN, afib on Coumadin, ESRD s/p kidney tranplant 12 years ago, and DM type II.HD M,W,F   Clinical Impression   Pt admitted with the above diagnosis and has the deficits outlined below. Pt would benefit from cont OT to increase independence with all adls and adl mobility as well as increase his tolerance for activity so he can d/c home with his daughter and caregiver. At this point, pt may not be safe or strong enough to return home and may benefit from SNF prior to returning home.  If pt improves and can achieve level where +1 assist is all he needs, he may be able to return home with [facility] services.  Pt very weak at this time, spoke of late wife and wanting to join her and giving up.  Nursing notified.      Follow Up Recommendations  SNF;Supervision/Assistance - 24 hour    Equipment Recommendations  None recommended by OT    Recommendations for Other Services       Precautions / Restrictions Precautions Precautions: Fall;Other (comment) (wounds) Precaution Comments: goes to wound care center in [place] Required Braces or Orthoses: Other Brace/Splint Other Brace/Splint: darco shoe for R L foot Restrictions Weight Bearing Restrictions: No      Mobility Bed Mobility Overal bed mobility: Needs Assistance Bed Mobility: Rolling;Sidelying to Sit;Sit to Supine Rolling: Min assist Sidelying to sit: Mod assist   Sit to supine: Max assist   General bed mobility comments: Pt required assist to get hips straight on EOB and to scoot out to EOB.  Pt very fearful of falling and slipping off the bed (fell out of recliner on last visit?)  Pt fatigued quickly sitting on EOB requring almost total assist to  lay back down.  Transfers Overall transfer level: Needs assistance Equipment used: 1 person hand held assist;Rolling walker (2 wheeled) Transfers: Sit to/from Stand Sit to Stand: Total assist         General transfer comment: When bed put up high, pt was able to acheive full standing x1 for a few seconds but had a heavy posterior lean so returned to sitting.    Balance Overall balance assessment: Needs assistance Sitting-balance support: Feet supported;Bilateral upper extremity supported Sitting balance-Leahy Scale: Poor   Postural control: Right lateral lean Standing balance support: During functional activity;Bilateral upper extremity supported Standing balance-Leahy Scale: Zero Standing balance comment: Pt required full assist to remain standing even with a assistive device.                            ADL Overall ADL's : Needs assistance/impaired Eating/Feeding: Set up;Sitting   Grooming: Wash/dry face;Wash/dry hands;Oral care;Set up;Sitting;Bed level   Upper Body Bathing: Minimal assitance;Sitting;Bed level   Lower Body Bathing: Maximal assistance;Bed level   Upper Body Dressing : Moderate assistance;Sitting;Bed level   Lower Body Dressing: Maximal assistance;Sitting/lateral leans;Bed level   Toilet Transfer: Maximal assistance;+2 for physical assistance;BSC   Toileting- Clothing Manipulation and Hygiene: Total assistance;+2 for physical assistance;Sit to/from stand       Functional mobility during ADLs: Maximal assistance;+2 for physical assistance;Rolling walker General ADL Comments: Pt very weak and appears very depressed.  Spoke of wife who passed 4 years ago  and wanting to join her in [place].  Pt requires max assist with all lower body adls, has posterior lean in sitting in standing requiring +2 assist when standing during adls.     Vision Vision Assessment?: No apparent visual deficits   Perception     Praxis      Pertinent Vitals/Pain Pain  Assessment: Faces Faces Pain Scale: Hurts even more Pain Location: R leg Pain Descriptors / Indicators: Aching;Grimacing Pain Intervention(s): Limited activity within patient's tolerance;Monitored during session;Repositioned     Hand Dominance Right   Extremity/Trunk Assessment Upper Extremity Assessment Upper Extremity Assessment: Generalized weakness   Lower Extremity Assessment Lower Extremity Assessment: Defer to PT evaluation   Cervical / Trunk Assessment Cervical / Trunk Assessment: Other exceptions;Kyphotic Cervical / Trunk Exceptions: melanoma removed off of upper thorasic area.   Communication Communication Communication: No difficulties   Cognition Arousal/Alertness: Awake/alert Behavior During Therapy: WFL for tasks assessed/performed Overall Cognitive Status: Within Functional Limits for tasks assessed                     General Comments       Exercises       Shoulder Instructions      Home Living Family/patient expects to be discharged to:: Private residence Living Arrangements: Children Available Help at Discharge: Family;Personal care attendant;Other (Comment) (has 24/7 coverage wtih one person) Type of Home: House Home Access: Ramped entrance     Home Layout: Two level;Able to live on main level with bedroom/bathroom     Bathroom Shower/Tub: Tub/shower unit;Walk-in shower;Other (comment) (pt currently only sponge bathes.)   Bathroom Toilet: Standard Bathroom Accessibility: Yes How Accessible: Accessible via wheelchair;Accessible via walker Home Equipment: [place] - 4 wheels;Shower seat;Walker - 2 wheels;Cane - single point;Grab bars - toilet;Grab bars - tub/shower;Hand held shower head   Additional Comments: all bathrooms have been made handicap accessible.      Prior Functioning/Environment Level of Independence: Needs assistance  Gait / Transfers Assistance Needed: uses primarily the w/c in the house now/walker on occasion but not  last two weeks b/c of weakness.   ADL's / Homemaking Assistance Needed: hired help for all cooking/cleaning.  Aid now assisting with all adls the last 2-3 weeks.  Communication / Swallowing Assistance Needed: appeared to have some trouble managing his secretions.  Nursing notified.          OT Problem List: Decreased strength;Decreased activity tolerance;Impaired balance (sitting and/or standing);Decreased knowledge of precautions;Pain   OT Treatment/Interventions: Self-care/ADL training;DME and/or AE instruction;Therapeutic activities;Balance training    OT Goals(Current goals can be found in the care plan section) Acute Rehab OT Goals Patient Stated Goal: to get stronger OT Goal Formulation: With patient Time For Goal Achievement: 02/28/16 Potential to Achieve Goals: Good ADL Goals Pt Will Perform Grooming: with set-up;sitting Pt Will Transfer to Toilet: with mod assist;bedside commode;stand pivot transfer Pt Will Perform Toileting - Clothing Manipulation and hygiene: with mod assist;sit to/from stand Additional ADL Goal #1: Pt will sit on the EOB for 4 minutes with S while performing one upper body adl task to improve sitting balance.  OT Frequency: Min 2X/week   Barriers to D/C: Decreased caregiver support  has one person available to assist at this time.       Co-evaluation              End of Session Equipment Utilized During Treatment: Surveyor, mining Communication: Mobility status  Activity Tolerance: Patient limited by fatigue Patient left: in bed;with call bell/phone  within reach   Time: 0843-0916 OT Time Calculation (min): 33 min Charges:  OT General Charges $OT Visit: 1 Procedure OT Evaluation $OT Eval Moderate Complexity: 1 Procedure OT Treatments $Self Care/Home Management : 8-22 mins G-Codes:    Glenford Peers 2016/03/14, 9:49 AM  [phone]

## 2016-02-14 NOTE — Consult Note (Signed)
   [facility] CM Inpatient Consult   02/14/2016  [name] Apr 15, 1940 [id number]    Patient screened for [place] Management program services. Chart reviewed. Appears therapy recommendations are for SNF. Will not engage for [place] Management services at this time. If disposition needs change, please place [facility] Care Management consult. Spoke with inpatient RNCM.  Marthenia Rolling, MSN-Ed, RN,BSN [facility] Liaison [phone]

## 2016-02-14 NOTE — Progress Notes (Signed)
ANTICOAGULATION CONSULT NOTE - Follow Up Consult  Pharmacy Consult for Coumadin Indication: atrial fibrillation  Allergies  Allergen Reactions  . Tape Other (See Comments)    SKIN WILL TEAR!!    Patient Measurements: Height: 5\' 9"  (175.3 cm) Weight: 165 lb 9.1 oz (75.1 kg) IBW/kg (Calculated) : 70.7   Vital Signs: Temp: 98.2 F (36.8 C) (11/10 0430) Temp Source: Oral (11/10 0430) BP: 130/78 (11/10 0430) Pulse Rate: 110 (11/10 0430)  Labs:  Recent Labs  02/12/16 1525 02/12/16 1532 02/12/16 1829 02/13/16 0339 02/14/16 0405  HGB 12.9* 13.6  --  12.2* 11.7*  HCT 39.7 40.0  --  36.6* 35.5*  PLT 235  --   --  241 168  LABPROT >90.0*  --   --  42.8* 21.2*  INR >10.00*  --   --  4.36* 1.81  CREATININE 2.13* 2.10* 2.07* 1.91* 1.35*    Estimated Creatinine Clearance: 47.3 mL/min (by C-G formula based on SCr of 1.35 mg/dL (H)).  Assessment: 75yo male with AFib.  INR 1.81 this AM.  Hg down a bit and pltc- dec but wnl.  No bleeding noted.  Will resume Coumadin today.   Per d/w Coumadin clinic- pt was on Coumadin 2.5mg  daily.    Goal of Therapy:  INR 2-3 Monitor platelets by anticoagulation protocol: Yes   Plan:  Coumadin 2.5mg  po today Daily INR Watch for s/s of bleeding  [name], PharmD [facility]

## 2016-02-14 NOTE — Evaluation (Signed)
Clinical/Bedside Swallow Evaluation Patient Details  Name: Mark [name] MRN: [id number] Date of Birth: [birth year]  Today's Date: 02/14/2016 Time: SLP Start Time (ACUTE ONLY): 35 SLP Stop Time (ACUTE ONLY): 1330 SLP Time Calculation (min) (ACUTE ONLY): 18 min  Past Medical History:  Past Medical History:  Diagnosis Date  . Atrial fibrillation ([place])   . CHF (congestive heart failure) ([place])   . Diabetes ([place])   . Diabetic neuropathy ([place])   . Gout   . Hypertension   . Hypothyroidism   . Renal disorder    kidney transplant   . Ulcer of other part of foot 12/15/2013   Past Surgical History:  Past Surgical History:  Procedure Laterality Date  . AMPUTATION Left 11/06/2015   Procedure: AMPUTATION LEFT GREAT TOE;  Surgeon: Elam Dutch, MD;  Location: [facility];  Service: Vascular;  Laterality: Left;  . ANKLE FRACTURE SURGERY    . AV FISTULA PLACEMENT Left 11/19/2015   Procedure: LEFT ARM ARTERIOVENOUS (AV) FISTULA CREATION;  Surgeon: Waynetta Sandy, MD;  Location: [facility];  Service: Vascular;  Laterality: Left;  . CATARACT EXTRACTION    . INSERTION OF DIALYSIS CATHETER Right 11/19/2015   Procedure: INSERTION OF DIALYSIS CATHETER RIGHT INTERNAL JUGULAR;  Surgeon: Waynetta Sandy, MD;  Location: [facility];  Service: Vascular;  Laterality: Right;  . IR GENERIC HISTORICAL  11/14/2015   IR FLUORO GUIDE CV LINE RIGHT 11/14/2015 Markus Daft, MD [facility]  . IR GENERIC HISTORICAL  11/14/2015   IR US GUIDE VASC ACCESS RIGHT 11/14/2015 Markus Daft, MD [facility]  . MELANOMA EXCISION    . NEPHRECTOMY TRANSPLANTED ORGAN    . PERIPHERAL VASCULAR CATHETERIZATION N/A 11/05/2015   Procedure: Abdominal Aortogram w/Lower Extremity;  Surgeon: Serafina Mitchell, MD;  Location: [facility];  Service: Cardiovascular;  Laterality: N/A;  . PERIPHERAL VASCULAR CATHETERIZATION Left 11/05/2015   Procedure: Peripheral Vascular Atherectomy;  Surgeon: Serafina Mitchell, MD;  Location: [facility];  Service: Cardiovascular;  Laterality: Left;  popiteal  . PERIPHERAL VASCULAR CATHETERIZATION Left 11/05/2015   Procedure: Peripheral Vascular Balloon Angioplasty;  Surgeon: Serafina Mitchell, MD;  Location: [facility];  Service: Cardiovascular;  Laterality: Left;  anterior tib   HPI:  Mark [name] is a 75 y.o. male presenting with weakness and weight loss 2/2 poor oral intake over last 3 months. PMH is significant for ESRD (TTS, s/p kidney transplant), HTN, A-fib (rate controlled), DMT2 (s/p left hallux amputation), hypothyroidism, gout, chronic wounds (diabetes and melanoma of back). Admitted wit progressive weakness.    Assessment / Plan / Recommendation Clinical Impression  Pt referred for clinical assessment of swallow function given recent weight loss and reports from RN of occasional gurgly vocal quality with liquid intake. Pt quite tearful and somewhat labile during our session, expressing futility re: his life and readiness to pass. Discussed with RN and encouraged pastoral care consult. Pt tolerated all PO intake without s/s aspiration with the exception of immediate cough following large sip of thin via straw. Pt did require verbal encouragement to sit upright for PO trials and does report that he coughs when he trys to drink in a reclined position. Discussed the importance of upright positioning during PO intake with pt. Pt verbalized agreement to comply with swallow precautions. Discussed swallow precautions and concern re: pt affect/mental status with RN. No further SLP involvement indicated at this time. Diet to be advanced per physician staff- no restrictions from SLP perspective.  Aspiration Risk  Mild aspiration risk;Risk for inadequate nutrition/hydration (given loss of appetite, decreased willingness to sit up)    Diet Recommendation  (per physician staff)   Liquid Administration via: Cup;Straw (small sips) Medication Administration: Whole meds with  liquid Supervision: Patient able to self feed;Intermittent supervision to cue for compensatory strategies Compensations: Small sips/bites;Slow rate Postural Changes: Seated upright at 90 degrees;Remain upright for at least 30 minutes after po intake    Other  Recommendations Oral Care Recommendations: Oral care QID   Follow up Recommendations Skilled Nursing facility;24 hour supervision/assistance      Frequency and Duration            Prognosis Prognosis for Safe Diet Advancement: Good Barriers to Reach Goals: Motivation      Swallow Study   General Date of Onset: 02/12/16 HPI: Mark [name] is a 75 y.o. male presenting with weakness and weight loss 2/2 poor oral intake over last 3 months. PMH is significant for ESRD (TTS, s/p kidney transplant), HTN, A-fib (rate controlled), DMT2 (s/p left hallux amputation), hypothyroidism, gout, chronic wounds (diabetes and melanoma of back). Admitted wit progressive weakness.  Type of Study: Bedside Swallow Evaluation Previous Swallow Assessment: none Diet Prior to this Study: Thin liquids Temperature Spikes Noted: No Respiratory Status: Room air History of Recent Intubation: No Behavior/Cognition: Alert (Labile) Oral Cavity Assessment: Within Functional Limits Oral Care Completed by SLP: No Oral Cavity - Dentition: Adequate natural dentition;Dentures, top Vision: Functional for self-feeding Self-Feeding Abilities: Able to feed self Patient Positioning: Upright in bed Baseline Vocal Quality: Normal Volitional Cough: Weak Volitional Swallow: Able to elicit    Oral/Motor/Sensory Function Overall Oral Motor/Sensory Function: Within functional limits   Ice Chips Ice chips: Not tested   Thin Liquid Thin Liquid: Impaired Presentation: Cup;Straw Pharyngeal  Phase Impairments:  (immediate cough x 1 following large bolus via straw)    Nectar Thick Nectar Thick Liquid: Not tested   Honey Thick Honey Thick Liquid: Not tested   Puree  Puree: Within functional limits   Solid   GO   Solid: Within functional limits        Vinetta Bergamo MA, [place] Pager [phone] 02/14/2016,1:48 PM

## 2016-02-15 DIAGNOSIS — K859 Acute pancreatitis without necrosis or infection, unspecified: Principal | ICD-10-CM

## 2016-02-15 LAB — GLUCOSE, CAPILLARY
GLUCOSE-CAPILLARY: 104 mg/dL — AB (ref 65–99)
GLUCOSE-CAPILLARY: 166 mg/dL — AB (ref 65–99)
GLUCOSE-CAPILLARY: 84 mg/dL (ref 65–99)
GLUCOSE-CAPILLARY: 89 mg/dL (ref 65–99)
Glucose-Capillary: 54 mg/dL — ABNORMAL LOW (ref 65–99)
Glucose-Capillary: 101 mg/dL — ABNORMAL HIGH (ref 65–99)
Glucose-Capillary: 105 mg/dL — ABNORMAL HIGH (ref 65–99)

## 2016-02-15 LAB — POTASSIUM
Potassium: 2.7 mmol/L — CL (ref 3.5–5.1)
Potassium: 4.2 mmol/L (ref 3.5–5.1)
Potassium: 4 mmol/L (ref 3.5–5.1)

## 2016-02-15 LAB — CBC
HEMATOCRIT: 32.9 % — AB (ref 39.0–52.0)
HEMOGLOBIN: 10.9 g/dL — AB (ref 13.0–17.0)
MCH: 29.1 pg (ref 26.0–34.0)
MCHC: 33.1 g/dL (ref 30.0–36.0)
MCV: 87.7 fL (ref 78.0–100.0)
Platelets: 179 10*3/uL (ref 150–400)
RBC: 3.75 MIL/uL — ABNORMAL LOW (ref 4.22–5.81)
RDW: 17 % — ABNORMAL HIGH (ref 11.5–15.5)
WBC: 8.5 10*3/uL (ref 4.0–10.5)

## 2016-02-15 LAB — MAGNESIUM: Magnesium: 1.2 mg/dL — ABNORMAL LOW (ref 1.7–2.4)

## 2016-02-15 LAB — PHOSPHORUS: PHOSPHORUS: 2.3 mg/dL — AB (ref 2.5–4.6)

## 2016-02-15 LAB — BASIC METABOLIC PANEL
Anion gap: 8 (ref 5–15)
BUN: 21 mg/dL — ABNORMAL HIGH (ref 6–20)
CHLORIDE: 105 mmol/L (ref 101–111)
CO2: 23 mmol/L (ref 22–32)
CREATININE: 1.26 mg/dL — AB (ref 0.61–1.24)
Calcium: 7.7 mg/dL — ABNORMAL LOW (ref 8.9–10.3)
GFR calc non Af Amer: 54 mL/min — ABNORMAL LOW (ref >60)
Glucose, Bld: 61 mg/dL — ABNORMAL LOW (ref 65–99)
POTASSIUM: 2.7 mmol/L — AB (ref 3.5–5.1)
Sodium: 136 mmol/L (ref 135–145)

## 2016-02-15 LAB — PROTIME-INR
INR: 1.79
Prothrombin Time: 21.1 seconds — ABNORMAL HIGH (ref 11.4–15.2)

## 2016-02-15 MED ORDER — INSULIN ASPART 100 UNIT/ML ~~LOC~~ SOLN
0.0000 [IU] | Freq: Three times a day (TID) | SUBCUTANEOUS | Status: DC
  Administered 2016-02-16: 7 [IU] via SUBCUTANEOUS
  Administered 2016-02-16: 3 [IU] via SUBCUTANEOUS
  Administered 2016-02-17: 5 [IU] via SUBCUTANEOUS
  Administered 2016-02-17: 2 [IU] via SUBCUTANEOUS

## 2016-02-15 MED ORDER — INSULIN GLARGINE 100 UNIT/ML ~~LOC~~ SOLN
8.0000 [IU] | Freq: Every day | SUBCUTANEOUS | Status: DC
  Filled 2016-02-15: qty 0.08

## 2016-02-15 MED ORDER — POTASSIUM CHLORIDE CRYS ER 20 MEQ PO TBCR
40.0000 meq | EXTENDED_RELEASE_TABLET | Freq: Once | ORAL | Status: AC
  Administered 2016-02-15: 40 meq via ORAL
  Filled 2016-02-15: qty 2

## 2016-02-15 MED ORDER — INSULIN ASPART 100 UNIT/ML ~~LOC~~ SOLN
0.0000 [IU] | Freq: Every day | SUBCUTANEOUS | Status: DC
  Administered 2016-02-16: 3 [IU] via SUBCUTANEOUS

## 2016-02-15 MED ORDER — WARFARIN SODIUM 2.5 MG PO TABS
2.5000 mg | ORAL_TABLET | Freq: Once | ORAL | Status: AC
  Administered 2016-02-15: 2.5 mg via ORAL
  Filled 2016-02-15: qty 1

## 2016-02-15 MED ORDER — DEXTROSE 50 % IV SOLN
INTRAVENOUS | Status: AC
  Administered 2016-02-15: 50 mL
  Filled 2016-02-15: qty 50

## 2016-02-15 MED ORDER — HEPARIN SODIUM (PORCINE) 1000 UNIT/ML DIALYSIS: 20.0000 [IU]/kg | INTRAMUSCULAR | Status: DC | PRN

## 2016-02-15 NOTE — NC FL2 (Signed)
[place] MEDICAID FL2 LEVEL OF CARE SCREENING TOOL     IDENTIFICATION  Patient Name: Mark [name] Birthdate: 1940/10/12 Sex: male Admission Date (Current Location): 02/12/2016  [facility] and [place] Number:  Herbalist and Address:  [facility], [place] [address], [place], [place] [address]      Provider Number: [id number]  Attending Physician Name and Address:  Dickie La, MD  Relative Name and Phone Number:  Rhea Pink    Current Level of Care: Hospital Recommended Level of Care: [place] Prior Approval Number:    Date Approved/Denied:   PASRR Number: [id number]  Discharge Plan: SNF    Current Diagnoses: Patient Active Problem List   Diagnosis Date Noted  . Protein-calorie malnutrition, severe 02/14/2016  . Generalized abdominal pain   . General weakness   . Atrial fibrillation with RVR ([place]) 02/12/2016  . Acute pancreatitis 02/12/2016  . ESRD (end stage renal disease) ([place])   . Melena   . CHF (congestive heart failure) ([place])   . Bibasilar crackles   . Aspiration of liquid   . SOB (shortness of breath)   . Permanent atrial fibrillation ([place])   . Renal transplant recipient 10/28/2015  . Diabetic ulcer of left great toe ([place]) 10/28/2015  . Gangrene of toe ([place]) 10/28/2015  . Cellulitis of great toe of left foot 10/28/2015  . Blister of foot with infection 02/22/2015  . Diabetic peripheral neuropathy ([place]) 06/12/2014  . Hammer toe of right foot 02/16/2014  . Onychomycosis 02/02/2014  . Pain in lower limb 02/02/2014  . Ulcer of right foot ([place]) 01/19/2014  . Ulcer of right ankle ([place]) 01/08/2014  . Ulcer of other part of foot 12/15/2013  . Type II diabetes mellitus, uncontrolled ([place]) 10/20/2012  . Other and unspecified hyperlipidemia 10/20/2012  . Chronic kidney disease, stage III (moderate) 10/20/2012  . Unspecified hypothyroidism 10/20/2012  . Edema extremities 10/20/2012  . Gout 10/20/2012     Orientation RESPIRATION BLADDER Height & Weight     Self, Time, Situation, Place  Normal Incontinent Weight: 167 lb 6.4 oz (75.9 kg) Height:  5\' 9"  (175.3 cm)  BEHAVIORAL SYMPTOMS/MOOD NEUROLOGICAL BOWEL NUTRITION STATUS  Other (Comment) (None)  (None) Incontinent    AMBULATORY STATUS COMMUNICATION OF NEEDS Skin   Limited Assist Verbally                         Personal Care Assistance Level of Assistance  Bathing, Dressing Bathing Assistance: Limited assistance   Dressing Assistance: Limited assistance     Functional Limitations Info             SPECIAL CARE FACTORS FREQUENCY  PT (By licensed PT), OT (By licensed OT)     PT Frequency: 5X Wk OT Frequency: 5X Wk            Contractures Contractures Info: Not present    Additional Factors Info  Code Status Code Status Info: DNR             Current Medications (02/15/2016):  This is the current hospital active medication list Current Facility-Administered Medications  Medication Dose Route Frequency Provider Last Rate Last Dose  . 0.9 %  sodium chloride infusion  100 mL Intravenous PRN Fleet Contras, MD      . 0.9 %  sodium chloride infusion  100 mL Intravenous PRN Fleet Contras, MD      . 0.9 %  sodium chloride infusion  Intravenous Continuous Toco Bing, DO 50 mL/hr at 02/14/16 0555    . acetaminophen (TYLENOL) tablet 650 mg  650 mg Oral Q6H PRN [place] Bing, DO       Or  . acetaminophen (TYLENOL) suppository 650 mg  650 mg Rectal Q6H PRN [place] Bing, DO      . alteplase (CATHFLO ACTIVASE) injection 2 mg  2 mg Intracatheter Once PRN Fleet Contras, MD      . Chlorhexidine Gluconate Cloth 2 % PADS 6 each  6 each Topical Q0600 Dickie La, MD   6 each at 02/14/16 0600  . feeding supplement (BOOST / RESOURCE BREEZE) liquid 1 Container  1 Container Oral TID WC [place] Bing, DO   1 Container at 02/15/16 [phone]  . fluconazole (DIFLUCAN) tablet 150 mg  150 mg Oral Daily [place] Bing, DO   150 mg at 02/14/16 1538  . heparin injection 1,000 Units  1,000 Units Dialysis PRN Fleet Contras, MD      . heparin injection 1,800 Units  20 Units/kg Dialysis PRN Fleet Contras, MD      . insulin aspart (novoLOG) injection 0-5 Units  0-5 Units Subcutaneous QHS Sela Hua, MD      . insulin aspart (novoLOG) injection 0-9 Units  0-9 Units Subcutaneous TID WC Sela Hua, MD      . insulin glargine (LANTUS) injection 8 Units  8 Units Subcutaneous QHS Sela Hua, MD      . levalbuterol [facility]) nebulizer solution 1.25 mg  1.25 mg Nebulization Q6H PRN [place] Bing, DO      . levothyroxine (SYNTHROID, LEVOTHROID) tablet 125 mcg  125 mcg Oral QAC breakfast Everrett Coombe, MD   125 mcg at 02/15/16 0827  . lidocaine (PF) (XYLOCAINE) 1 % injection 5 mL  5 mL Intradermal PRN Fleet Contras, MD      . lidocaine-prilocaine (EMLA) cream 1 application  1 application Topical PRN Fleet Contras, MD      . metoprolol succinate (TOPROL-XL) 24 hr tablet 50 mg  50 mg Oral Daily [place] Bing, DO   50 mg at 02/14/16 [id number]  . multivitamin (RENA-VIT) tablet 1 tablet  1 tablet Oral QHS [place] Bing, DO   1 tablet at 02/14/16 2141  . mupirocin ointment (BACTROBAN) 2 % 1 application  1 application Nasal BID Dickie La, MD   1 application at [phone]  . ondansetron (ZOFRAN) tablet 4 mg  4 mg Oral Q6H PRN [place] Bing, DO       Or  . ondansetron [facility]) injection 4 mg  4 mg Intravenous Q6H PRN [place] Bing, DO      . pantoprazole (PROTONIX) EC tablet 40 mg  40 mg Oral BID [place] Bing, DO   40 mg at 02/14/16 2141  . pentafluoroprop-tetrafluoroeth (GEBAUERS) aerosol 1 application  1 application Topical PRN Fleet Contras, MD      . polyethylene glycol (MIRALAX / GLYCOLAX) packet 17 g  17 g Oral Daily PRN [place] Bing, DO      . predniSONE (DELTASONE) tablet 5 mg  5 mg Oral Daily Everrett Coombe, MD   5 mg at 02/14/16 0854  . rosuvastatin (CRESTOR) tablet 5  mg  5 mg Oral QPM [place] Bing, DO   5 mg at 02/14/16 1813  . sodium chloride flush (NS) 0.9 % injection 3 mL  3 mL Intravenous Q12H [place] McMullen, DO   3 mL  at 02/14/16 1000  . tacrolimus (PROGRAF) capsule 0.5 mg  0.5 mg Oral BID [place] Bing, DO   0.5 mg at 02/14/16 2140  . tamsulosin (FLOMAX) capsule 0.4 mg  0.4 mg Oral QPC [place] Bing, DO   0.4 mg at 02/14/16 1813  . vitamin B-12 (CYANOCOBALAMIN) tablet 100 mcg  100 mcg Oral Daily [place] Bing, DO   100 mcg at 02/14/16 0854  . warfarin (COUMADIN) tablet 2.5 mg  2.5 mg Oral ONCE-1800 Haze Boyden, [facility]      . Warfarin - Pharmacist Dosing Inpatient   Does not apply Elsie, [facility]         Discharge Medications: Please see discharge summary for a list of discharge medications.  Relevant Imaging Results:  Relevant Lab Results:   Additional Information Pt has weekly HD schedule TTS Noon-4P  Creighton Longley B, LCSWA

## 2016-02-15 NOTE — Progress Notes (Signed)
ANTICOAGULATION CONSULT NOTE - Follow Up Consult  Pharmacy Consult for Coumadin Indication: atrial fibrillation  Allergies  Allergen Reactions  . Tape Other (See Comments)    SKIN WILL TEAR!!    Patient Measurements: Height: 5\' 9"  (175.3 cm) Weight: 167 lb 6.4 oz (75.9 kg) IBW/kg (Calculated) : 70.7   Vital Signs: Temp: 98.3 F (36.8 C) (11/11 0447) Temp Source: Oral (11/11 0447) BP: 134/68 (11/11 0447) Pulse Rate: 83 (11/11 0447)  Labs:  Recent Labs  02/13/16 0339 02/14/16 0405 02/15/16 0339  HGB 12.2* 11.7* 10.9*  HCT 36.6* 35.5* 32.9*  PLT 241 168 179  LABPROT 42.8* 21.2* 21.1*  INR 4.36* 1.81 1.79  CREATININE 1.91* 1.35* 1.26*    Estimated Creatinine Clearance: 50.7 mL/min (by C-G formula based on SCr of 1.26 mg/dL (H)).  Assessment: CoumRx for Afib.  -INR >10 on admit, down to 1.79 this AM -no overt bleeding  -Hg 10.9, pltc wnl.    Per d/w Coumadin clinic- pt was on Coumadin 2.5mg  daily.    Goal of Therapy:  INR 2-3 Monitor platelets by anticoagulation protocol: Yes   Plan:  Coumadin 2.5mg  po today Daily INR Watch for s/s of bleeding  Myer Peer Grayland [place]), PharmD  PGY1 Pharmacy Resident Pager: [phone] 02/15/2016 7:24 AM

## 2016-02-15 NOTE — Progress Notes (Signed)
CSW called pt's dtr Rhea Pink [phone] to discuss pt's DC, Deedee not available, CSW left VM with contact information. CSW will continue to make efforts to reach family.  Lance [place] B. Paulette Blanch [phone]

## 2016-02-15 NOTE — Progress Notes (Signed)
Family Medicine Teaching Service Daily Progress Note Intern Pager: [phone]  Patient name: Mark [name] Medical record number: [id number] Date of birth: 1940/04/23 Age: 75 y.o. Gender: male  Primary Care Provider: Rochel Brome, MD Consultants: Nephrology Code Status: DNR (confirmed during admission)  Pt Overview and Major Events to Date:  11/08: Admit for unexplained 60 lb weight loss and weakness for 3 months 11/09: HD  Assessment and Plan: Mark [name] is a 75 y.o. male presenting with weakness and weight loss 2/2 poor oral intake over last 3 months. PMH is significant for ESRD (TTS, s/p kidney transplant), HTN, A-fib (rate controlled), DMT2 (s/p left hallux amputation), hypothyroidism, gout, chronic wounds (diabetes and melanoma of back).  #Weakness, Subacute, Progressive: Patient says weakness has been problem since he began losing weight 3 months ago. DDx also includes dialysis versus protein calorie malnutrition versus dehydration versus deconditioning versus infectious. Has history of hypothyroidism, TSH low 0.126. Weight on admission 160 lbs compared to 228 lbs on 10/2015. UA with mod Hgb and large leuks with rare bacteria on micro with yeast present. CT chest/abd/pelvis without evidence of malignancy --Will stop fluids today --UCx pending --PT/OT recommending SNF --Palliative care consult  #Mild acute pancreatitis: Lipase 341 on admission. Suggestive of acute pancreatitis. Abdominal U/S unable to visualize pancrease due to bowel gas. CT abd/pelvis consistent with mild acute pancreatitis. --Clear liquid diet. Advance to full liquid diet. --Patient will need to stop [place] while on dialysis and should be stopped on d/c --Monitor I&O  #Protein and Calorie Malnutrition/Weight Loss, Acute, Worsening: Likely 2/2 to protein and calorie malnourishment. Uncertain source for decreased oral intake. Never smoker. Patient seems to have minimal appetite and supplements with  boost shakes. Weight on admission 160 lbs > 167lb today. (weight 228 lbs on 10/2015). Drop in weight seems to be aligned with initiation of dialysis. Currently eating ~25% of meals. --Full liquid diet --Monitor I&Os --Daily weights --Nutrition recommending Boost Breeze tid.  #ESRD (HD TTS), with Azotemia, S/p Kidney Transplant: Patient sees [facility] in [place]. Says he received dialysis day before admission but missed last 30 mins of session. Has kidney transplant which failed 2/2 CT contrast dye in past. Takes tacrolimus and prednisone 5 mg QD at home. Left arm AV fistula not yet mature. Renal transplant U/S w/o evidence of mass or hydronephrosis. --Continue home tacrolimus 0.5 mg BID --Continue home Prednisone 5 mg QD --Nephrology consulted, appreciate recs. Plan for HD today. --F/u CBC, renal panel, mag, phos in AM  #Hypokalemia/Hypomagnesemia: K 2.7 this morning, Mag 1.2. Replaced overnight with K-dur 57mEq x 2. - HD today - Will defer electrolyte management to Nephrology.  #Funguria: UA with yeast present --Per Nephro, start antifungals. --Urine culture pending --Diflucan 150mg  PO x 7 days (Today is Day 2).  #A-Fib, Supratheraputic INR on Warfarin, Acute, Uncontrolled: Currently asymptomatic. Blood pressure stable. INR measured >10 on admission, vitamin K 5 mg IV given. On coumadin for a-fib. Patient uncertain on his coumadin dose but denies recent history of bleeds or bloody stools. INR 1.79 today. --Coumadin restarted 11/10. --Coumadin per pharmacy --Daily INRs  #Chronic Wounds/H/o Melanoma Removal of Back, Chronic, Stable: Se[facility] wound care in [facility] for chronic lower extremity wounds. Melanoma removed 35 years ago on upper back. No evidence concerning for cellulitis or deep tissue infection at this time. --Wound care consult  #Hypertension, Chronic, Stable: BP 134/68 this morning. Takes Toprolol 50 mg QD at home. --Toprolol 50 mg QD  #Diabetes Mellitus Type  2, Chronic, Stable: CBGs 54-89 overnight  and this morning. Takes 26 U Lantus, and 0-15 U Novolog TID at home. A1c 6.7 on 11/08. Do not need tight blood sugar control in this gentleman. Goal A1c ~8%. --Stop Lantus --Change CBGs from every 4 hours to tid with meals and at bedtime. --Change Sensitive SSI from every 4 hours to tid with meals and at bedtime.  #Gout, Chronic, Controlled: Takes allopurinol 150 mg QD. Uric acid low at 0.7. --Holding allopurinol for now given need for HD, consider restarting  #Hypothyroidism, Chronic: TSH low 0.126. Takes synthroid 137 mcg QD.  --Synthroid to 125 mcg QD, and continue this dose on d/c  #BPH, Chronic: BP stable. On Flomax at home. --Flomax 0.4 mg QD  FEN/GI: Clear liquid diet, Boost supplements TID, Protonix 40 mg BID Prophylaxis: Coumadin per pharm.  Disposition: PT/OT recommending SNF. Hopeful for placement on Monday 11/13.  Subjective:  Pt states he is doing fine this morning. No concerns.  Objective: Temp:  [98.2 F (36.8 C)-98.3 F (36.8 C)] 98.3 F (36.8 C) (11/11 0447) Pulse Rate:  [83-95] 83 (11/11 0447) Resp:  [20-26] 20 (11/11 0447) BP: (134-146)/(66-69) 134/68 (11/11 0447) SpO2:  [100 %] 100 % (11/11 0447) Weight:  [167 lb 6.4 oz (75.9 kg)] 167 lb 6.4 oz (75.9 kg) (11/11 0447) Physical Exam: General: frail elderly male, well developed, in no acute distress with non-toxic appearance HEENT: normocephalic, atraumatic, moist mucous membranes, white sclera CV: irregularly irregular without murmurs, rubs, or gallops Lungs: clear to auscultation bilaterally with normal work of breathing Abdomen: soft, no tenderness at renal transplanted kidney, normoactive bowel sounds Skin: warm, dry, cap refill <2 seconds Extremities: warm and well perfused, normal tone, right ecchymosis on shin, left hallux amputation with dry dressing and w/o erythema or purulence, small erosive lesion on the right great toe dorsal surface w/o erythema or  purulence, left-sided upper extremity fistula in place with bruit Neuro: A&O to person, place, and time  Laboratory:  Recent Labs Lab 02/13/16 0339 02/14/16 0405 02/15/16 0339  WBC 10.8* 9.9 8.5  HGB 12.2* 11.7* 10.9*  HCT 36.6* 35.5* 32.9*  PLT 241 168 179    Recent Labs Lab 02/12/16 1525  02/13/16 0339 02/14/16 0405 02/15/16 0339  NA 135  < > 137 136 136  K 3.6  < > 3.1* 3.3* 2.7*  CL 100*  < > 103 103 105  CO2 26  < > 25 26 23   BUN 28*  < > 31* 16 21*  CREATININE 2.13*  < > 1.91* 1.35* 1.26*  CALCIUM 8.4*  < > 8.2* 8.0* 7.7*  PROT 5.8*  --   --   --   --   BILITOT 1.0  --   --   --   --   ALKPHOS 53  --   --   --   --   ALT 13*  --   --   --   --   AST 31  --   --   --   --   GLUCOSE 297*  < > 183* 106* 61*  < > = values in this interval not displayed. Imaging/Diagnostic Tests: DG Chest Port 1 View (02/12/16) FINDINGS: Double lumen central venous catheter in place, unchanged. Heart size and pulmonary vascularity are normal. Lungs are clear. No bone abnormality.  IMPRESSION: No active disease.  US Abdomen Complete (02/13/16) IMPRESSION: 1. Poor visualization of pancreas due to bowel gas. No gross peripancreatic fluid collections. 2. Mildly echogenic liver suggests fatty infiltration 3. Gallbladder is filled  with debris/sludge and small stones. Normal wall thickness and normal duct diameter. 4. Spleen and aorta were not able to be evaluated due to bowel gas and patient physical condition. 5. Nonvisualization of native kidneys. Left lower quadrant renal transplant.  US Renal Transplant w/ Doppler (02/13/16) IMPRESSION: 1. Grossly normal Doppler evaluation of left lower quadrant transplanted kidney. 2. Probable echogenic debris within the bladder with mass felt unlikely.  Sela Hua, MD 02/15/2016, 8:45 AM PGY-2, [place] Intern pager: [phone], text pages welcome

## 2016-02-15 NOTE — Progress Notes (Signed)
Hypoglycemic Event  CBG: 54  Treatment: D50 IV 25 mL  Symptoms: None  Follow-up CBG: Time:0526 CBG Result:84  Possible Reasons for Event: Inadequate meal intake  Comments/MD notified:Amin/ On call [place]

## 2016-02-15 NOTE — Clinical Social Work Note (Signed)
Clinical Social Work Assessment  Patient Details  Name: Mark [name] MRN: [id number] Date of Birth: [birth year]  Date of referral:  02/15/16               Reason for consult:  Facility Placement, Discharge Planning                Permission sought to share information with:  Case Manager, Customer service manager, Family Supports Permission granted to share information::  Yes, Verbal Permission Granted  Name::     Daughter  Agency::  SNF's  Relationship::     Contact Information:     Housing/Transportation Living arrangements for the past 2 months:  Single Family Home Source of Information:  Patient Patient Interpreter Needed:  None Criminal Activity/Legal Involvement Pertinent to Current Situation/Hospitalization:  No - Comment as needed Significant Relationships:  Adult Children, Other(Comment) (Pt has caregivers 5 days a week from 7A-6P) Lives with:  Self, Adult Children (Daughter moved with pt after last hospitalization) Do you feel safe going back to the place where you live?  No Need for family participation in patient care:  Yes (Comment)  Care giving concerns:  Pt's dtr resides with him and prior to hospitalization pt hired caregivers from 7A-6P while dtr works. Dtr does not feel she can care for pt alone after caregivers leave, pt's baseline is being able to transfer to/from wheelchair, pt unable to do that at this time. Physical Therapy has recommended SNF.   Social Worker assessment / plan:  CSW reviewed pt's chart and met with pt @ bedside. Pt pleasant, alert and oriented X4. CSW discussed SNF, pt on board with going, pt has hx @ [facility] and  [facility] and has selected these facilities for SNF. CSW will initiate request. Pt reports that his dtr moved in with him after his last hospitalization to assist with his care. Dtr works daily M-F, pt has hired caregivers from 7A-6P while dtr works, reports that 1 caregiver lacks strength to help with his transfer to/from  wheelchair and recently had to call his son to come over and assist. Pt reports recent change of health where he is lacking the ability to assist with transfers to/from his wheelchair. CSW will continue to monitor pt's progress and initiate a safe DC to SNF. CSW will remain available to pt/family as needed.   Employment status:  Retired Forensic scientist:  Medicare PT Recommendations:  [place] / Referral to community resources:     Patient/Family's Response to care:  Pt very happy with the care he is receiving and CSW's involvement with discharge to SNF.  Patient/Family's Understanding of and Emotional Response to Diagnosis, Current Treatment, and Prognosis:  Pt understands his medical conditions and discussed his limitations with CSW. Pt understands the importance of him going to SNF and understands the importance of his TTS HD schedule.  Emotional Assessment Appearance:  Appears stated age Attitude/Demeanor/Rapport:  Crying (Pt appropriate but became tearful as he spoke of his wife's death and the care that he currently needs to remain safe in his home.) Affect (typically observed):  Accepting Orientation:  Oriented to Self, Oriented to Place, Oriented to  Time, Oriented to Situation Alcohol / Substance use:  Not Applicable Psych involvement (Current and /or in the community):  No (Comment)  Discharge Needs  Concerns to be addressed:    Readmission within the last 30 days:  No Current discharge risk:  Other (Pt resides with dtr who works daily M-F, pt has  caregiver support from 7A-6P) Barriers to Discharge:  Continued Medical Work up, Unsafe home situation   [place], Al Decant 02/15/2016, 10:54 AM

## 2016-02-15 NOTE — Progress Notes (Addendum)
CRITICAL VALUE ALERT  Critical value received: Potassium 2.7  Date of notification:  02/15/2016  Time of notification:  0500  Critical value read back: yes  Nurse who received alert:  Kerrie Buffalo  MD notified (1st page):  Family Medicine text page  Time of first page:  0506  MD notified (2nd page):  Time of second page:  Responding MD:  Reesa Chew  Time MD responded: 0515  Potassium ordered

## 2016-02-15 NOTE — Progress Notes (Signed)
Patient ID: Mark [name], male   DOB: 1940/04/17, 75 y.o.   MRN: [id number]  [facility] Progress Note    Subjective:   Feels better, no complaints   Objective:   BP (!) 152/88 (BP Location: Right Arm)   Pulse 92   Temp 98.3 F (36.8 C) (Oral)   Resp 20   Ht 5\' 9"  (1.753 m)   Wt 75.9 kg (167 lb 6.4 oz)   SpO2 100%   BMI 24.72 kg/m   Intake/Output: I/O last 3 completed shifts: In: 2640 [P.O.:540; I.V.:2100] Out: 900 [Urine:900]   Intake/Output this shift:  No intake/output data recorded. Weight change: -3.468 kg (-7 lb 10.3 oz)  Physical Exam: Gen:WD WN WM in NAD CVS:no rub Resp:cta [id number], renal allograft, LLQ Ext: no edema, L BC AVF +T/B  Labs: BMET  Recent Labs Lab 02/12/16 1525 02/12/16 1532 02/12/16 1829 02/12/16 2011 02/13/16 0339 02/14/16 0405 02/15/16 0339  NA 135 138 137  --  137 136 136  K 3.6 3.5 3.6  --  3.1* 3.3* 2.7*  2.7*  CL 100* 98* 102  --  103 103 105  CO2 26  --  28  --  25 26 23   GLUCOSE 297* 283* 257*  --  183* 106* 61*  BUN 28* 29* 28*  --  31* 16 21*  CREATININE 2.13* 2.10* 2.07*  --  1.91* 1.35* 1.26*  ALBUMIN 2.3*  --  2.3*  --  2.0* 1.9*  --   CALCIUM 8.4*  --  8.4*  --  8.2* 8.0* 7.7*  PHOS  --   --  2.8 2.8 2.7 2.4* 2.3*   CBC  Recent Labs Lab 02/12/16 1525 02/12/16 1532 02/13/16 0339 02/14/16 0405 02/15/16 0339  WBC 10.1  --  10.8* 9.9 8.5  NEUTROABS 7.7  --   --   --   --   HGB 12.9* 13.6 12.2* 11.7* 10.9*  HCT 39.7 40.0 36.6* 35.5* 32.9*  MCV 88.4  --  88.4 88.8 87.7  PLT 235  --  241 168 179    @IMGRELPRIORS @ Medications:    . Chlorhexidine Gluconate Cloth  6 each Topical Q0600  . feeding supplement  1 Container Oral TID WC  . fluconazole  150 mg Oral Daily  . insulin aspart  0-5 Units Subcutaneous QHS  . insulin aspart  0-9 Units Subcutaneous TID WC  . insulin glargine  8 Units Subcutaneous QHS  . levothyroxine  125 mcg Oral QAC breakfast  . metoprolol succinate  50 mg Oral  Daily  . multivitamin  1 tablet Oral QHS  . mupirocin ointment  1 application Nasal BID  . pantoprazole  40 mg Oral BID  . predniSONE  5 mg Oral Daily  . rosuvastatin  5 mg Oral QPM  . sodium chloride flush  3 mL Intravenous Q12H  . tacrolimus  0.5 mg Oral BID  . tamsulosin  0.4 mg Oral QPC supper  . vitamin B-12  100 mcg Oral Daily  . warfarin  2.5 mg Oral ONCE-1800  . Warfarin - Pharmacist Dosing Inpatient   Does not apply [id number]    Dialyzes at [facility] July 2017 (after failed transplant).  Primary Nephrologist Dr. Marval Regal EDW [phone] (left at 77kg on Tues) HD Bath 4/2.25 Heparin 3600 unit bolus Access RIJ cath; lt BCF placed July 2017  Assessment/ Plan:   1. Abdominal pain- ddx pancreatitis, acute rejection of transplanted kidney, ischemic bowel (after leaving below edw on HD). 1. He  had elevated lipase yesterday but also had large leukocytes with some blood on UA. 2. Bowel rest for now and repeat amylase/lipase today. 3. Will also send UA for cx and sensitivity given large leukocytes. 4. [place] of renal transplant was without any evidence of rejection and no peri-nephric fluid but did have debris in bladder. 5. CT scan supports mild pancreatitis 2. ESRD Continue with TTS schedule but no UF 02/13/16. 3. Failed renal transplant- although low Scr and good UOP will continue to follow as he may have some recovery of renal function.  Will order 24 hour creatinine clearance while he remains an inpatient starting tomorrow.  4. Anemia: hold ESA due to hgb >12 5. funguria- would start antifungals, cultures pending. Diflucan 150mg  daily for 7 days due to immunocompromised status. 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 Hypokalemia- will plan for 4K bath with HD today 8. Nutrition: protein malnutrition. Cont with renal diet when able to tolerate po 9. Hypertension: stable 10. Vascular access- LUE AVF +T/B placed 11/19/15, not yet mature 11. DM per primary svc 1.   Donetta Potts,  MD [place] Pager [phone] 02/15/2016, 11:39 AM

## 2016-02-15 NOTE — Clinical Social Work Placement (Signed)
   CLINICAL SOCIAL WORK PLACEMENT  NOTE  Date:  02/15/2016  Patient Details  Name: Mark [name] MRN: [id number] Date of Birth: Nov 30, 1940  Clinical Social Work is seeking post-discharge placement for this patient at the [place] level of care (*CSW will initial, date and re-position this form in  chart as items are completed):  Yes   Patient/family provided with [place] Work Department's list of facilities offering this level of care within the geographic area requested by the patient (or if unable, by the patient's family).  Yes   Patient/family informed of their freedom to choose among providers that offer the needed level of care, that participate in Medicare, Medicaid or managed care program needed by the patient, have an available bed and are willing to accept the patient.  Yes   Patient/family informed of 's ownership interest in [facility] and [facility], as well as of the fact that they are under no obligation to receive care at these facilities.  PASRR submitted to EDS on 02/15/16     PASRR number received on       Existing PASRR number confirmed on       FL2 transmitted to all facilities in geographic area requested by pt/family on       FL2 transmitted to all facilities within larger geographic area on 02/15/16     Patient informed that his/her managed care company has contracts with or will negotiate with certain facilities, including the following:            Patient/family informed of bed offers received.  Patient chooses bed at       Physician recommends and patient chooses bed at      Patient to be transferred to   on  .  Patient to be transferred to facility by       Patient family notified on   of transfer.  Name of family member notified:        PHYSICIAN       Additional Comment:    _______________________________________________ Serafina Mitchell, LCSWA 02/15/2016, 11:08 AM

## 2016-02-16 DIAGNOSIS — R634 Abnormal weight loss: Secondary | ICD-10-CM

## 2016-02-16 DIAGNOSIS — Z515 Encounter for palliative care: Secondary | ICD-10-CM

## 2016-02-16 LAB — RENAL FUNCTION PANEL
Albumin: 1.8 g/dL — ABNORMAL LOW (ref 3.5–5.0)
Anion gap: 8 (ref 5–15)
BUN: 9 mg/dL (ref 6–20)
CALCIUM: 7.9 mg/dL — AB (ref 8.9–10.3)
CHLORIDE: 102 mmol/L (ref 101–111)
CO2: 26 mmol/L (ref 22–32)
CREATININE: 1.09 mg/dL (ref 0.61–1.24)
Glucose, Bld: 159 mg/dL — ABNORMAL HIGH (ref 65–99)
PHOSPHORUS: 2.2 mg/dL — AB (ref 2.5–4.6)
Potassium: 3.9 mmol/L (ref 3.5–5.1)
Sodium: 136 mmol/L (ref 135–145)

## 2016-02-16 LAB — GLUCOSE, CAPILLARY
GLUCOSE-CAPILLARY: 320 mg/dL — AB (ref 65–99)
Glucose-Capillary: 226 mg/dL — ABNORMAL HIGH (ref 65–99)
Glucose-Capillary: 258 mg/dL — ABNORMAL HIGH (ref 65–99)

## 2016-02-16 LAB — PROTIME-INR
INR: 2.1
Prothrombin Time: 23.9 seconds — ABNORMAL HIGH (ref 11.4–15.2)

## 2016-02-16 LAB — URINE CULTURE: Culture: >=100000 — AB

## 2016-02-16 LAB — TROPONIN I

## 2016-02-16 MED ORDER — WARFARIN SODIUM 1 MG PO TABS
1.0000 mg | ORAL_TABLET | Freq: Once | ORAL | Status: AC
  Administered 2016-02-16: 1 mg via ORAL
  Filled 2016-02-16: qty 1

## 2016-02-16 MED ORDER — MIRTAZAPINE 7.5 MG PO TABS
15.0000 mg | ORAL_TABLET | Freq: Every day | ORAL | Status: DC
  Administered 2016-02-16: 15 mg via ORAL
  Filled 2016-02-16: qty 2

## 2016-02-16 NOTE — Consult Note (Signed)
Consultation Note Date: 02/16/2016   Patient Name: Mark [name]  DOB: 05/15/1940  MRN: [id number]  Age / Sex: 75 y.o., male  PCP: Rochel Brome, MD Referring Physician: Dickie La, MD  Reason for Consultation: Establishing goals of care  HPI/Patient Profile: 74 y.o. male  with past medical history of ESRD (kidney transplant 2004), HTN, A. FIB, DM2 (s/p L hallux amputaion), hypothyroidism, gout, and chronic wounds who was admitted on 02/12/2016 with weakness and abdominal pain. There was concern for acute rejection of transplanted kidney, although renal US without evidence of rejection. He did have mild pancreatitis, which has improved. Overall, since starting dialysis in July he has experienced a progressive decline in his functional status, with increasing weakness and weight loss.   Clinical Assessment and Goals of Care: I met with Mr. Perfect in conjunction with [name]. During our conversation [name] relayed his long kidney history, with special emphasis placed on his transplant in 2004, then his return to dialysis this past July. He endorsed a concurrent decline in his energy, worsening sleep, and progressive weight loss since starting dialysis.   Furthermore, our conversations yielded insight into a profound existential suffering he is experiencing. He expressed strong belief that the return to dialysis was his fault, as he didn't actively seek out management of his left toe (which required amputation, and contrast dye associated with this issue prompted need for dialysis). He has also suffered extensive loss, with the death of his wife (his "partner" of 14 years), father, brother, and a close friend (who verbalized to [name] that he had nothing to live for and chose to stop dialysis). Mr. Bierlein has lived an extraordinary life with involved travel and extensive business and financial  responsibility, and is now finding himself with declining health, the loss of his main support person, and less day to day purpose in his life. As he expressed it, he could "watch tv all day, but there is nothing else I need to do."   Finally, [name] was able to verbalize a care/intervention endpoint. He feels that he would not want to continue dialysis if his kidney transplant has entirely failed. He views dialysis as a time limited intervention, and would want a conversation with his Kidney team if they believed it would be an indefinite necessity. He did designate his daughter as [name] (paperwork already in place, per his report). In terms of discharge, he found his previous stay in SNF/rehab was more detrimental than beneficial, and would prefer to be discharged home. He has 24 hour support, with his daughter there at night, and a caregiver for during the day. His house is also fully handicap accessible.   Primary Decision Maker PATIENT   SUMMARY OF RECOMMENDATIONS  -Discharge recommendation for home with home health/PT/OT discussed with primary team. Would also suggest having a home health LCSW visit with patient for counseling and therapeutic support. -Pt encouraged to write down his stories--creating a memoir. This will likely help give him a day to day purpose  and help him celebrate his meaningful legacy. -Start Remeron for sleep and appetite support. The mood support would likely require titration from 9m to 369m which should be considered based on his tolerance at the lower dose.  -Continue clear discussions with Nephrology team about present and future direction of care; he is still working under the assumption that there is an endpoint to dialysis.   Code Status/Advance Care Planning:  DNR  Additional Recommendations (Limitations, Scope, Preferences):  Full Scope Treatment  Psycho-social/Spiritual:   Desire for further Chaplaincy support:yes  Additional Recommendations:  Grief/Bereavement Support  Prognosis:   Unable to determine  Discharge Planning: Pt would qualify for Hospice if and when he stops dialysis. In the meantime, he would prefer for discharge home with home health support.       Primary Diagnoses: Present on Admission: . Atrial fibrillation with RVR ([facility]. Acute pancreatitis   I have reviewed the medical record, interviewed the patient and family, and examined the patient. The following aspects are pertinent.  Past Medical History:  Diagnosis Date  . Atrial fibrillation ([facility]  . CHF (congestive heart failure) ([facility]  . Diabetes ([facility]  . Diabetic neuropathy ([facility]  . Gout   . Hypertension   . Hypothyroidism   . Renal disorder    kidney transplant   . Ulcer of other part of foot 12/15/2013   Social History   Social History  . Marital status: Widowed    Spouse name: N/A  . Number of children: N/A  . Years of education: N/A   Social History Main Topics  . Smoking status: Never Smoker  . Smokeless tobacco: Never Used  . Alcohol use No  . Drug use: No  . Sexual activity: Not Asked   Other Topics Concern  . None   Social History Narrative  . None   Family History  Problem Relation Age of Onset  . Bradycardia Mother   . Stroke Father    Scheduled Meds: . Chlorhexidine Gluconate Cloth  6 each Topical Q0600  . feeding supplement  1 Container Oral TID WC  . fluconazole  150 mg Oral Daily  . insulin aspart  0-5 Units Subcutaneous QHS  . insulin aspart  0-9 Units Subcutaneous TID WC  . levothyroxine  125 mcg Oral QAC breakfast  . metoprolol succinate  50 mg Oral Daily  . mirtazapine  15 mg Oral QHS  . multivitamin  1 tablet Oral QHS  . mupirocin ointment  1 application Nasal BID  . pantoprazole  40 mg Oral BID  . predniSONE  5 mg Oral Daily  . rosuvastatin  5 mg Oral QPM  . sodium chloride flush  3 mL Intravenous Q12H  . tacrolimus  0.5 mg Oral BID  . tamsulosin  0.4 mg Oral QPC supper  . vitamin B-12  100  mcg Oral Daily  . warfarin  1 mg Oral ONCE-1800  . Warfarin - Pharmacist Dosing Inpatient   Does not apply q1800   Continuous Infusions: PRN Meds:.sodium chloride, sodium chloride, acetaminophen **OR** acetaminophen, alteplase, heparin, heparin, levalbuterol, lidocaine (PF), lidocaine-prilocaine, ondansetron **OR** ondansetron (ZOFRAN) IV, pentafluoroprop-tetrafluoroeth, polyethylene glycol Allergies  Allergen Reactions  . Tape Other (See Comments)    SKIN WILL TEAR!!   Review of Systems  Constitutional: Positive for activity change, appetite change (decreased, no interest in food) and unexpected weight change (weight loss).  HENT: Negative for congestion.   Respiratory: Negative for shortness of breath.   Gastrointestinal: Negative for abdominal pain  and nausea.  Musculoskeletal: Negative for back pain.  Psychiatric/Behavioral: Positive for sleep disturbance.       Sad, loss of purpose    Physical Exam  Constitutional: He is oriented to person, place, and time.  HENT:  Head: Normocephalic and atraumatic.  Eyes: EOM are normal.  Neck: Normal range of motion.  Cardiovascular:  Ocasional A. Fib noted on monitor  Pulmonary/Chest: Effort normal.  Neurological: He is alert and oriented to person, place, and time.  Skin: Skin is warm and dry. There is pallor.  Psychiatric: His speech is normal and behavior is normal. Judgment and thought content normal. Cognition and memory are normal. He exhibits a depressed mood (appears sad, tearful at times).   Vital Signs: BP 136/85   Pulse (!) 118   Temp 98.3 F (36.8 C)   Resp 19   Ht '5\' 9"'  (1.753 m)   Wt 77.6 kg (171 lb 1.6 oz)   SpO2 98%   BMI 25.27 kg/m  Pain Assessment: No/denies pain   Pain Score: 3    SpO2: SpO2: 98 % O2 Device:SpO2: 98 % O2 Flow Rate: .   IO: Intake/output summary:  Intake/Output Summary (Last 24 hours) at 02/16/16 0930 Last data filed at 02/16/16 0515  Gross per 24 hour  Intake              120 ml    Output             1475 ml  Net            -1355 ml    LBM: Last BM Date: 02/15/16 Baseline Weight: Weight: 72.6 kg (160 lb) Most recent weight: Weight: 77.6 kg (171 lb 1.6 oz)     Palliative Assessment/Data:  PPS 60%    Time In: 0820 Time Out: 0920 Time Total: 60 minutes Greater than 50%  of this time was spent counseling and coordinating care related to the above assessment and plan.  Signed by: Charlynn Court, NP Palliative Medicine Team Team Phone # [phone] (Nights/Weekends)

## 2016-02-16 NOTE — Progress Notes (Signed)
Family Medicine Teaching Service Daily Progress Note Intern Pager: [phone]  Patient name: Mark [name] Medical record number: [id number] Date of birth: 02-21-1941 Age: 75 y.o. Gender: male  Primary Care Provider: Rochel Brome, MD Consultants: Nephrology Code Status: DNR (confirmed during admission)  Pt Overview and Major Events to Date:  11/08: Admit for unexplained 60 lb weight loss and weakness for 3 months 11/09: HD  Assessment and Plan: Mark [name] is a 75 y.o. male presenting with weakness and weight loss 2/2 poor oral intake over last 3 months. PMH is significant for ESRD (TTS, s/p kidney transplant), HTN, A-fib (rate controlled), DMT2 (s/p left hallux amputation), hypothyroidism, gout, chronic wounds (diabetes and melanoma of back).  #Weakness, Subacute, Progressive: Patient says weakness has been problem since he began losing weight 3 months ago. DDx also includes dialysis versus protein calorie malnutrition versus dehydration versus deconditioning versus infectious. Has history of hypothyroidism, TSH low 0.126. Weight on admission 160 lbs compared to 228 lbs on 10/2015. UA with mod Hgb and large leuks with rare bacteria on micro with yeast present. CT chest/abd/pelvis without evidence of malignancy --UCx with >100,000 colonies of yeast. --PT/OT recommending SNF --Palliative care consult --Check troponin x 1 to make sure we are not missing silent MI as a cause of his weakness.  #Mild acute pancreatitis: Lipase 341 on admission. Suggestive of acute pancreatitis. Abdominal U/S unable to visualize pancreas due to bowel gas. CT abd/pelvis consistent with mild acute pancreatitis. Pt not having any more abdominal pain. --Transition to soft diet today --Patient will need to stop [place] while on dialysis and should be stopped on d/c --Monitor I&O  #Protein and Calorie Malnutrition/Weight Loss, Acute, Worsening: Likely 2/2 to protein and calorie malnourishment. Uncertain  source for decreased oral intake. Never smoker. Patient seems to have minimal appetite and supplements with boost shakes. Weight on admission 160 lbs > 167lb today. (weight 228 lbs on 10/2015). Drop in weight seems to be aligned with initiation of dialysis. Currently eating ~25% of meals. --Soft diet --Monitor I&Os --Daily weights --Nutrition recommending Boost Breeze tid.  #ESRD (HD TTS), with Azotemia, S/p Kidney Transplant: Patient sees [facility] in [place]. Says he received dialysis day before admission but missed last 30 mins of session. Has kidney transplant which failed 2/2 CT contrast dye in past. Takes tacrolimus and prednisone 5 mg QD at home. Left arm AV fistula not yet mature. Renal transplant U/S w/o evidence of mass or hydronephrosis. --Continue home tacrolimus 0.5 mg BID --Continue home Prednisone 5 mg QD --Nephrology consulted, appreciate recs. HD TTS. --F/u CBC, renal panel, mag, phos in AM  #Hypokalemia/Hypomagnesemia: K 3.9 this morning, Mag 1.2.  - HD TTS - Will defer electrolyte management to Nephrology.  #Funguria: UA with yeast present --Per Nephro, start antifungals. --Urine culture with >100,000 colonies of yeast. Awaiting speciation. --Diflucan 150mg  PO x 7 days (Today is Day 3).  #A-Fib, Supratheraputic INR on Warfarin, Acute, Uncontrolled: Currently asymptomatic. Blood pressure stable. INR measured >10 on admission, vitamin K 5 mg IV given. On coumadin for a-fib. Patient uncertain on his coumadin dose but denies recent history of bleeds or bloody stools. INR 2.10 today. --Coumadin restarted 11/10. --Coumadin per pharmacy --Daily INRs  #Chronic Wounds/H/o Melanoma Removal of Back, Chronic, Stable: Sees wound care in [facility] for chronic lower extremity wounds. Melanoma removed 35 years ago on upper back. No evidence concerning for cellulitis or deep tissue infection at this time. --Wound care consult  #Hypertension, Chronic, Stable: BP 134/68  this morning. Takes  Toprolol 50 mg QD at home. --Toprolol 50 mg QD  #Diabetes Mellitus Type 2, Chronic, Stable: CBGs 84-166 in the last 24 hours. Takes 26 U Lantus, and 0-15 U Novolog TID at home. A1c 6.7 on 11/08. Do not need tight blood sugar control in this gentleman. Goal A1c ~8%. --Holding Lantus --Change CBGs tid with meals and at bedtime. --Change Sensitive SSI tid with meals and at bedtime.  #Gout, Chronic, Controlled: Takes allopurinol 150 mg QD. Uric acid low at 0.7. --Holding allopurinol for now given need for HD, consider restarting  #Hypothyroidism, Chronic: TSH low 0.126. Takes synthroid 137 mcg QD.  --Synthroid to 125 mcg QD, and continue this dose on d/c  #BPH, Chronic: BP stable. On Flomax at home. --Flomax 0.4 mg QD  FEN/GI: Clear liquid diet, Boost supplements TID, Protonix 40 mg BID Prophylaxis: Coumadin per pharm.  Disposition: PT/OT recommending SNF. Hopeful for placement on Monday 11/13.  Subjective:  Pt states he is doing fine this morning. No concerns. He has not really eaten much because he is not hungry.  Objective: Temp:  [97.7 F (36.5 C)-98.3 F (36.8 C)] 98.3 F (36.8 C) (11/12 0500) Pulse Rate:  [92-118] 118 (11/12 0500) Resp:  [18-23] 19 (11/12 0500) BP: (121-158)/(63-91) 136/85 (11/12 0500) SpO2:  [98 %-100 %] 98 % (11/12 0500) Weight:  [171 lb 1.6 oz (77.6 kg)-178 lb 9.2 oz (81 kg)] 171 lb 1.6 oz (77.6 kg) (11/12 0500) Physical Exam: General: frail elderly male, well developed, in no acute distress with non-toxic appearance, laying in bed HEENT: [place]/AT, mildly dry mucous membranes, EOMI CV: irregularly irregular without murmurs, rubs, or gallops Lungs: clear to auscultation bilaterally, normal work of breathing Abdomen: +BS, soft, non-tender, non-distended Skin: warm, dry Extremities: warm and well perfused, normal tone, right ecchymosis on shin, left hallux amputation with dry dressing and w/o erythema or purulence, small erosive  lesion on the right great toe dorsal surface w/o erythema or purulence, left-sided upper extremity fistula in place with bruit Neuro: A&O to person, place, and time  Laboratory:  Recent Labs Lab 02/13/16 0339 02/14/16 0405 02/15/16 0339  WBC 10.8* 9.9 8.5  HGB 12.2* 11.7* 10.9*  HCT 36.6* 35.5* 32.9*  PLT 241 168 179    Recent Labs Lab 02/12/16 1525  02/14/16 0405 02/15/16 0339 02/15/16 1152 02/15/16 2142 02/16/16 0348  NA 135  < > 136 136  --   --  136  K 3.6  < > 3.3* 2.7*  2.7* 4.2 4.0 3.9  CL 100*  < > 103 105  --   --  102  CO2 26  < > 26 23  --   --  26  BUN 28*  < > 16 21*  --   --  9  CREATININE 2.13*  < > 1.35* 1.26*  --   --  1.09  CALCIUM 8.4*  < > 8.0* 7.7*  --   --  7.9*  PROT 5.8*  --   --   --   --   --   --   BILITOT 1.0  --   --   --   --   --   --   ALKPHOS 53  --   --   --   --   --   --   ALT 13*  --   --   --   --   --   --   AST 31  --   --   --   --   --   --  GLUCOSE 297*  < > 106* 61*  --   --  159*  < > = values in this interval not displayed. Imaging/Diagnostic Tests: DG Chest Port 1 View (02/12/16) FINDINGS: Double lumen central venous catheter in place, unchanged. Heart size and pulmonary vascularity are normal. Lungs are clear. No bone abnormality.  IMPRESSION: No active disease.  US Abdomen Complete (02/13/16) IMPRESSION: 1. Poor visualization of pancreas due to bowel gas. No gross peripancreatic fluid collections. 2. Mildly echogenic liver suggests fatty infiltration 3. Gallbladder is filled with debris/sludge and small stones. Normal wall thickness and normal duct diameter. 4. Spleen and aorta were not able to be evaluated due to bowel gas and patient physical condition. 5. Nonvisualization of native kidneys. Left lower quadrant renal transplant.  US Renal Transplant w/ Doppler (02/13/16) IMPRESSION: 1. Grossly normal Doppler evaluation of left lower quadrant transplanted kidney. 2. Probable echogenic debris within the  bladder with mass felt unlikely.  Sela Hua, MD 02/16/2016, 9:24 AM PGY-2, [place] Intern pager: [phone], text pages welcome

## 2016-02-16 NOTE — Progress Notes (Signed)
Patient ID: Mark [name], male   DOB: [birth year], 75 y.o.   MRN: [id number]  [facility] Progress Note    Subjective:   Feels well but weak   Objective:   BP 136/85   Pulse (!) 118   Temp 98.3 F (36.8 C)   Resp 19   Ht 5\' 9"  (1.753 m)   Wt 77.6 kg (171 lb 1.6 oz)   SpO2 98%   BMI 25.27 kg/m   Intake/Output: I/O last 3 completed shifts: In: 2280 [P.O.:180; I.V.:2100] Out: 1975 [Urine:975; Other:1000]   Intake/Output this shift:  No intake/output data recorded. Weight change: 5.068 kg (11 lb 2.8 oz)  Physical Exam: [id number] elderly WM in NAd CVS:no rub, tachy, irregular Resp:cta [id number] no pain/tenderness Ext:no edema, LUE AVF +T/B, RIJ TDC  Labs: BMET  Recent Labs Lab 02/12/16 1525 02/12/16 1532 02/12/16 1829 02/12/16 2011 02/13/16 0339 02/14/16 0405 02/15/16 0339 02/15/16 1152 02/15/16 2142 02/16/16 0348  NA 135 138 137  --  137 136 136  --   --  136  K 3.6 3.5 3.6  --  3.1* 3.3* 2.7*  2.7* 4.2 4.0 3.9  CL 100* 98* 102  --  103 103 105  --   --  102  CO2 26  --  28  --  25 26 23   --   --  26  GLUCOSE 297* 283* 257*  --  183* 106* 61*  --   --  159*  BUN 28* 29* 28*  --  31* 16 21*  --   --  9  CREATININE 2.13* 2.10* 2.07*  --  1.91* 1.35* 1.26*  --   --  1.09  ALBUMIN 2.3*  --  2.3*  --  2.0* 1.9*  --   --   --  1.8*  CALCIUM 8.4*  --  8.4*  --  8.2* 8.0* 7.7*  --   --  7.9*  PHOS  --   --  2.8 2.8 2.7 2.4* 2.3*  --   --  2.2*   CBC  Recent Labs Lab 02/12/16 1525 02/12/16 1532 02/13/16 0339 02/14/16 0405 02/15/16 0339  WBC 10.1  --  10.8* 9.9 8.5  NEUTROABS 7.7  --   --   --   --   HGB 12.9* 13.6 12.2* 11.7* 10.9*  HCT 39.7 40.0 36.6* 35.5* 32.9*  MCV 88.4  --  88.4 88.8 87.7  PLT 235  --  241 168 179    @IMGRELPRIORS @ Medications:    . Chlorhexidine Gluconate Cloth  6 each Topical Q0600  . feeding supplement  1 Container Oral TID WC  . fluconazole  150 mg Oral Daily  . insulin aspart  0-5 Units Subcutaneous  QHS  . insulin aspart  0-9 Units Subcutaneous TID WC  . levothyroxine  125 mcg Oral QAC breakfast  . metoprolol succinate  50 mg Oral Daily  . multivitamin  1 tablet Oral QHS  . mupirocin ointment  1 application Nasal BID  . pantoprazole  40 mg Oral BID  . predniSONE  5 mg Oral Daily  . rosuvastatin  5 mg Oral QPM  . sodium chloride flush  3 mL Intravenous Q12H  . tacrolimus  0.5 mg Oral BID  . tamsulosin  0.4 mg Oral QPC supper  . vitamin B-12  100 mcg Oral Daily  . warfarin  1 mg Oral ONCE-1800  . Warfarin - Pharmacist Dosing Inpatient   Does not apply [phone]   Primary Nephrologist  Dr. Marval Regal EDW [phone] (left at 77kg on Tues) HD Bath 4/2.25 Heparin 3600 unit bolus Access RIJ cath; lt BCF placed July 2017  Assessment/ Plan:   1. Abdominal pain- ddx pancreatitis, acute rejection of transplanted kidney, ischemic bowel (after leaving below edw on HD). 1. He had elevated lipase yesterday but also had large leukocytes with some blood on UA. 2. Bowel rest for now and repeat amylase/lipase today. 3. Will also send UA for cx and sensitivity given large leukocytes. 4. [place] of renal transplant was without any evidence of rejection and no peri-nephric fluid but did have debris in bladder. 5. CT scan supports mild pancreatitis 6. Clinically improved. 2. ESRD Continue with TTS schedule but no UF 02/13/16.  New EDW will be 77kg as an outpatient if he requires ongoing HD pending 24 hour creatinine clearance. 3. Failed renal transplant- although low Scr and good UOP will continue to follow as he may have some recovery of renal function.   1. ordered 24 hour creatinine clearance while he remains an inpatient starting today as his UOP has been >800 and Scr remains low and hopefully has regained some kidney function.  4. Anemia: hold ESA due to hgb >12 5. funguria- would start antifungals, cultures pending. Diflucan 150mg  daily for 7 days due to immunocompromised status. 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 Hypokalemia- will plan for 4K bath with HD today 8. Nutrition: protein malnutrition. Cont with renal diet when able to tolerate po 9. Hypertension: stable 10. Vascular access- LUE AVF +T/B placed 11/19/15, not yet mature 11. DM per primary svc 12. Deconditioning- will need rehab when stable for discharge 1.   Donetta Potts, MD [place] Pager [phone] 02/16/2016, 8:42 AM

## 2016-02-16 NOTE — Progress Notes (Signed)
Spoke with Mark [name] with Palliative Care who saw Mark [name] this morning. Mark [name] expressed to her that he would like to go home to live with his daughter. In my discussions over the last few days, Pt told me that he would want to go to SNF to get additional rehab before going home. I called the patient's daughter to take to her more about her home situation and to get her input on discharge planning. I left a voicemail stating we would continue to try to get in touch with her, or she can call the hospital and have the nurse page our team.  Greatly appreciate the input of the Palliative Care team.  Hyman Bible, MD PGY-2

## 2016-02-16 NOTE — Progress Notes (Signed)
ANTICOAGULATION CONSULT NOTE - Follow Up Consult  Pharmacy Consult for Coumadin Indication: atrial fibrillation  Allergies  Allergen Reactions  . Tape Other (See Comments)    SKIN WILL TEAR!!    Patient Measurements: Height: 5\' 9"  (175.3 cm) Weight: 171 lb 1.6 oz (77.6 kg) IBW/kg (Calculated) : 70.7   Vital Signs: Temp: 98.3 F (36.8 C) (11/12 0500) BP: 136/85 (11/12 0500) Pulse Rate: 118 (11/12 0500)  Labs:  Recent Labs  02/14/16 0405 02/15/16 0339 02/16/16 0348  HGB 11.7* 10.9*  --   HCT 35.5* 32.9*  --   PLT 168 179  --   LABPROT 21.2* 21.1* 23.9*  INR 1.81 1.79 2.10  CREATININE 1.35* 1.26* 1.09    Estimated Creatinine Clearance: 58.6 mL/min (by C-G formula based on SCr of 1.09 mg/dL).  Assessment: CoumRx for Afib.  -INR >10 on admit and reversed with Vitamin K 5mg  on 11/8 -INR increased 2.10 this AM after restarting coumadin last 2 nights -no overt bleeding  -Hg 10.9, pltc wnl as of 11/11 -Recent poor oral intake -Per d/w Coumadin clinic- pt was on Coumadin 2.5mg  daily  With the quick increase in INR to a therapeutic range on his previous home dose and documentation of poor oral outcome, I will decrease his dose for tonight to 1mg .   Goal of Therapy:  INR 2-3 Monitor platelets by anticoagulation protocol: Yes   Plan:  -Coumadin 1mg  x1 tonight -Daily INR -Watch for s/s of bleeding  Myer Peer Grayland Ormond), PharmD  PGY1 Pharmacy Resident Pager: [phone] 02/16/2016 8:16 AM

## 2016-02-17 DIAGNOSIS — E785 Hyperlipidemia, unspecified: Secondary | ICD-10-CM | POA: Diagnosis present

## 2016-02-17 DIAGNOSIS — E43 Unspecified severe protein-calorie malnutrition: Secondary | ICD-10-CM | POA: Diagnosis not present

## 2016-02-17 DIAGNOSIS — E039 Hypothyroidism, unspecified: Secondary | ICD-10-CM | POA: Diagnosis present

## 2016-02-17 DIAGNOSIS — I4891 Unspecified atrial fibrillation: Secondary | ICD-10-CM | POA: Diagnosis not present

## 2016-02-17 DIAGNOSIS — D649 Anemia, unspecified: Secondary | ICD-10-CM | POA: Diagnosis present

## 2016-02-17 DIAGNOSIS — R41841 Cognitive communication deficit: Secondary | ICD-10-CM | POA: Diagnosis not present

## 2016-02-17 DIAGNOSIS — L989 Disorder of the skin and subcutaneous tissue, unspecified: Secondary | ICD-10-CM | POA: Diagnosis not present

## 2016-02-17 DIAGNOSIS — D631 Anemia in chronic kidney disease: Secondary | ICD-10-CM | POA: Diagnosis not present

## 2016-02-17 DIAGNOSIS — L03116 Cellulitis of left lower limb: Secondary | ICD-10-CM | POA: Diagnosis present

## 2016-02-17 DIAGNOSIS — K219 Gastro-esophageal reflux disease without esophagitis: Secondary | ICD-10-CM | POA: Diagnosis present

## 2016-02-17 DIAGNOSIS — L97522 Non-pressure chronic ulcer of other part of left foot with fat layer exposed: Secondary | ICD-10-CM | POA: Diagnosis not present

## 2016-02-17 DIAGNOSIS — G63 Polyneuropathy in diseases classified elsewhere: Secondary | ICD-10-CM | POA: Diagnosis not present

## 2016-02-17 DIAGNOSIS — B999 Unspecified infectious disease: Secondary | ICD-10-CM | POA: Diagnosis not present

## 2016-02-17 DIAGNOSIS — R634 Abnormal weight loss: Secondary | ICD-10-CM | POA: Diagnosis not present

## 2016-02-17 DIAGNOSIS — Z89412 Acquired absence of left great toe: Secondary | ICD-10-CM | POA: Diagnosis not present

## 2016-02-17 DIAGNOSIS — I482 Chronic atrial fibrillation: Secondary | ICD-10-CM | POA: Diagnosis not present

## 2016-02-17 DIAGNOSIS — E118 Type 2 diabetes mellitus with unspecified complications: Secondary | ICD-10-CM | POA: Diagnosis not present

## 2016-02-17 DIAGNOSIS — E8889 Other specified metabolic disorders: Secondary | ICD-10-CM | POA: Diagnosis present

## 2016-02-17 DIAGNOSIS — E11628 Type 2 diabetes mellitus with other skin complications: Secondary | ICD-10-CM | POA: Diagnosis present

## 2016-02-17 DIAGNOSIS — E1140 Type 2 diabetes mellitus with diabetic neuropathy, unspecified: Secondary | ICD-10-CM | POA: Diagnosis present

## 2016-02-17 DIAGNOSIS — E876 Hypokalemia: Secondary | ICD-10-CM | POA: Diagnosis present

## 2016-02-17 DIAGNOSIS — L03115 Cellulitis of right lower limb: Secondary | ICD-10-CM | POA: Diagnosis present

## 2016-02-17 DIAGNOSIS — M7989 Other specified soft tissue disorders: Secondary | ICD-10-CM | POA: Diagnosis not present

## 2016-02-17 DIAGNOSIS — L97512 Non-pressure chronic ulcer of other part of right foot with fat layer exposed: Secondary | ICD-10-CM | POA: Diagnosis not present

## 2016-02-17 DIAGNOSIS — I120 Hypertensive chronic kidney disease with stage 5 chronic kidney disease or end stage renal disease: Secondary | ICD-10-CM | POA: Diagnosis not present

## 2016-02-17 DIAGNOSIS — S91301A Unspecified open wound, right foot, initial encounter: Secondary | ICD-10-CM | POA: Diagnosis not present

## 2016-02-17 DIAGNOSIS — Z940 Kidney transplant status: Secondary | ICD-10-CM | POA: Diagnosis not present

## 2016-02-17 DIAGNOSIS — N186 End stage renal disease: Secondary | ICD-10-CM | POA: Diagnosis not present

## 2016-02-17 DIAGNOSIS — I70209 Unspecified atherosclerosis of native arteries of extremities, unspecified extremity: Secondary | ICD-10-CM | POA: Diagnosis not present

## 2016-02-17 DIAGNOSIS — S91302A Unspecified open wound, left foot, initial encounter: Secondary | ICD-10-CM | POA: Diagnosis not present

## 2016-02-17 DIAGNOSIS — T8189XD Other complications of procedures, not elsewhere classified, subsequent encounter: Secondary | ICD-10-CM | POA: Diagnosis not present

## 2016-02-17 DIAGNOSIS — B351 Tinea unguium: Secondary | ICD-10-CM | POA: Diagnosis not present

## 2016-02-17 DIAGNOSIS — M6281 Muscle weakness (generalized): Secondary | ICD-10-CM | POA: Diagnosis not present

## 2016-02-17 DIAGNOSIS — E1142 Type 2 diabetes mellitus with diabetic polyneuropathy: Secondary | ICD-10-CM | POA: Diagnosis not present

## 2016-02-17 DIAGNOSIS — M79672 Pain in left foot: Secondary | ICD-10-CM | POA: Diagnosis not present

## 2016-02-17 DIAGNOSIS — R531 Weakness: Secondary | ICD-10-CM | POA: Diagnosis not present

## 2016-02-17 DIAGNOSIS — Z22322 Carrier or suspected carrier of Methicillin resistant Staphylococcus aureus: Secondary | ICD-10-CM | POA: Diagnosis not present

## 2016-02-17 DIAGNOSIS — N400 Enlarged prostate without lower urinary tract symptoms: Secondary | ICD-10-CM | POA: Diagnosis not present

## 2016-02-17 DIAGNOSIS — I5032 Chronic diastolic (congestive) heart failure: Secondary | ICD-10-CM | POA: Diagnosis not present

## 2016-02-17 DIAGNOSIS — K859 Acute pancreatitis without necrosis or infection, unspecified: Secondary | ICD-10-CM | POA: Diagnosis not present

## 2016-02-17 DIAGNOSIS — M109 Gout, unspecified: Secondary | ICD-10-CM | POA: Diagnosis present

## 2016-02-17 DIAGNOSIS — I739 Peripheral vascular disease, unspecified: Secondary | ICD-10-CM | POA: Diagnosis not present

## 2016-02-17 DIAGNOSIS — F329 Major depressive disorder, single episode, unspecified: Secondary | ICD-10-CM | POA: Diagnosis present

## 2016-02-17 DIAGNOSIS — R2681 Unsteadiness on feet: Secondary | ICD-10-CM | POA: Diagnosis not present

## 2016-02-17 DIAGNOSIS — R4181 Age-related cognitive decline: Secondary | ICD-10-CM | POA: Diagnosis not present

## 2016-02-17 DIAGNOSIS — E1152 Type 2 diabetes mellitus with diabetic peripheral angiopathy with gangrene: Secondary | ICD-10-CM | POA: Diagnosis present

## 2016-02-17 DIAGNOSIS — I132 Hypertensive heart and chronic kidney disease with heart failure and with stage 5 chronic kidney disease, or end stage renal disease: Secondary | ICD-10-CM | POA: Diagnosis present

## 2016-02-17 DIAGNOSIS — R279 Unspecified lack of coordination: Secondary | ICD-10-CM | POA: Diagnosis not present

## 2016-02-17 DIAGNOSIS — I96 Gangrene, not elsewhere classified: Secondary | ICD-10-CM | POA: Diagnosis not present

## 2016-02-17 DIAGNOSIS — E1129 Type 2 diabetes mellitus with other diabetic kidney complication: Secondary | ICD-10-CM | POA: Diagnosis not present

## 2016-02-17 DIAGNOSIS — M79674 Pain in right toe(s): Secondary | ICD-10-CM | POA: Diagnosis not present

## 2016-02-17 DIAGNOSIS — Z992 Dependence on renal dialysis: Secondary | ICD-10-CM | POA: Diagnosis not present

## 2016-02-17 DIAGNOSIS — N2581 Secondary hyperparathyroidism of renal origin: Secondary | ICD-10-CM | POA: Diagnosis not present

## 2016-02-17 DIAGNOSIS — R1310 Dysphagia, unspecified: Secondary | ICD-10-CM | POA: Diagnosis not present

## 2016-02-17 DIAGNOSIS — Z515 Encounter for palliative care: Secondary | ICD-10-CM | POA: Diagnosis not present

## 2016-02-17 DIAGNOSIS — R636 Underweight: Secondary | ICD-10-CM | POA: Diagnosis not present

## 2016-02-17 DIAGNOSIS — F322 Major depressive disorder, single episode, severe without psychotic features: Secondary | ICD-10-CM | POA: Diagnosis not present

## 2016-02-17 DIAGNOSIS — E1122 Type 2 diabetes mellitus with diabetic chronic kidney disease: Secondary | ICD-10-CM | POA: Diagnosis present

## 2016-02-17 DIAGNOSIS — R031 Nonspecific low blood-pressure reading: Secondary | ICD-10-CM | POA: Diagnosis not present

## 2016-02-17 DIAGNOSIS — L03119 Cellulitis of unspecified part of limb: Secondary | ICD-10-CM | POA: Diagnosis not present

## 2016-02-17 LAB — BASIC METABOLIC PANEL
ANION GAP: 7 (ref 5–15)
BUN: 15 mg/dL (ref 6–20)
CHLORIDE: 102 mmol/L (ref 101–111)
CO2: 25 mmol/L (ref 22–32)
Calcium: 8.3 mg/dL — ABNORMAL LOW (ref 8.9–10.3)
Creatinine, Ser: 1.52 mg/dL — ABNORMAL HIGH (ref 0.61–1.24)
GFR calc non Af Amer: 43 mL/min — ABNORMAL LOW (ref >60)
GFR, EST AFRICAN AMERICAN: 50 mL/min — AB (ref >60)
Glucose, Bld: 210 mg/dL — ABNORMAL HIGH (ref 65–99)
POTASSIUM: 3.4 mmol/L — AB (ref 3.5–5.1)
SODIUM: 134 mmol/L — AB (ref 135–145)

## 2016-02-17 LAB — CBC
HCT: 35.1 % — ABNORMAL LOW (ref 39.0–52.0)
HEMOGLOBIN: 11.6 g/dL — AB (ref 13.0–17.0)
MCH: 29.2 pg (ref 26.0–34.0)
MCHC: 33 g/dL (ref 30.0–36.0)
MCV: 88.4 fL (ref 78.0–100.0)
Platelets: 164 10*3/uL (ref 150–400)
RBC: 3.97 MIL/uL — AB (ref 4.22–5.81)
RDW: 17.2 % — ABNORMAL HIGH (ref 11.5–15.5)
WBC: 6.2 10*3/uL (ref 4.0–10.5)

## 2016-02-17 LAB — GLUCOSE, CAPILLARY
GLUCOSE-CAPILLARY: 172 mg/dL — AB (ref 65–99)
Glucose-Capillary: 172 mg/dL — ABNORMAL HIGH (ref 65–99)
Glucose-Capillary: 293 mg/dL — ABNORMAL HIGH (ref 65–99)

## 2016-02-17 LAB — PROTIME-INR
INR: 2.62
PROTHROMBIN TIME: 28.5 s — AB (ref 11.4–15.2)

## 2016-02-17 LAB — CREATININE CLEARANCE, URINE, 24 HOUR
COLLECTION INTERVAL-CRCL: 24 h
CREAT CLEAR: 26 mL/min — AB (ref 75–125)
Creatinine, 24H Ur: 402 mg/d — ABNORMAL LOW (ref 800–2000)
Creatinine, Urine: 123.69 mg/dL
Urine Total Volume-CRCL: 325 mL

## 2016-02-17 LAB — LIPASE, BLOOD: Lipase: 155 U/L — ABNORMAL HIGH (ref 11–51)

## 2016-02-17 LAB — AMYLASE: Amylase: 128 U/L — ABNORMAL HIGH (ref 28–100)

## 2016-02-17 MED ORDER — FLUCONAZOLE 150 MG PO TABS: 150.0000 mg | ORAL_TABLET | Freq: Every day | ORAL | 0 refills | Status: AC

## 2016-02-17 MED ORDER — INSULIN GLARGINE 100 UNIT/ML ~~LOC~~ SOLN: 5.0000 [IU] | Freq: Every day | SUBCUTANEOUS | 11 refills | Status: AC

## 2016-02-17 MED ORDER — WARFARIN SODIUM 1 MG PO TABS: ORAL_TABLET | ORAL | Status: DC

## 2016-02-17 MED ORDER — LEVOTHYROXINE SODIUM 125 MCG PO TABS: 125.0000 ug | ORAL_TABLET | Freq: Every day | ORAL | 0 refills | Status: DC

## 2016-02-17 NOTE — Progress Notes (Signed)
Family Medicine Teaching Service Daily Progress Note Intern Pager: [phone]  Patient name: Mark [name] Medical record number: [id number] Date of birth: 06/26/1940 Age: 75 y.o. Gender: male  Primary Care Provider: Rochel Brome, MD Consultants: Nephrology Code Status: DNR (confirmed during admission)  Pt Overview and Major Events to Date:  11/08: Admit for unexplained 60 lb weight loss and weakness for 3 months 11/09: HD  Assessment and Plan: Mark Ratley Hackettis a 75 y.o.malepresenting with weakness and weight loss 2/2 poor oral intake over last 3 months. PMH is significant for ESRD (TTS, s/p kidney transplant), HTN, A-fib (rate controlled), DMT2 (s/p left hallux amputation), hypothyroidism, gout, chronic wounds (diabetes and melanoma of back).  #Weakness,Progressive: Associated with weight loss over last 3 months. Considered ddx protein calorie malnutrition vs dialysis vs dehydration vs deconditioning vs infection.  TSH low at 0.126 with hx hypothyroidism.  - Wt 160 lbs on admission from 228 lbs 10/2015 - CT ch/abd/p without signs of malignancy - infection considered, see funguria below - PT/OT recommending SNF - Palliative care consult - mirtazapine started for weight gain and depression, however patient notes he slept all day after being given this medication and then experienced hallucinations. Will discontinue  #Protein and Calorie Malnutrition/Weight Loss: Likely 2/2 to protein and calorie malnourishment. Uncertain source for decreased oral intake. Never smoker. Poor appetite, supplements meals with shakes. Drop in weight seems to be aligned with initiation of dialysis. Currently eating ~25% of meals. - Weight on admission 160 lbs > 165lb today. (weight 228 lbs on 10/2015).  - soft diet --Monitor I&Os - Daily weights - continue boost breeze TID per Nutrition recs  #Funguria: UA with yeast present - Per Nephro, start antifungals. - Urine culture with >100,000 colonies of  yeast. Awaiting speciation. - Continue day #4 Diflucan 150mg  PO x 7 days (11/10 - 11/16)   #Mild acute pancreatitis: Lipase 341 on admission.Suggestive of acute pancreatitis. Abdominal U/S unable to visualize pancrease due to bowel gas. CT abd/pelvis consistent with mild acute pancreatitis. Abdomen soft and non tender.  - Patient will need to stop [place] while on dialysis and should be stopped on d/c - Monitor I&O  #ESRD (HD TTS), with Azotemia,S/p Kidney Transplant: Patient sees [facility] in [place].  Has kidney transplant which failed 2/2 CT contrast dye in past. Takes tacrolimus and prednisone 5 mg QD at home. Left arm AV fistula not yet mature. Renal transplant U/S w/o evidence of mass or hydronephrosis. - Continue home tacrolimus 0.5 mg BID - Continue home Prednisone 5 mg QD - Nephrology consulted, appreciate recs. HD TTS (repeat amylase/lipase show improvement)   #Hypokalemia/Hypomagnesemia: K pending, from 2.7 yesterday.  Mag 1.2. - HD TTTS - Will defer electrolyte management to Nephrology.  #A-Fib,Supratheraputic INR on Warfarin, Acute, Uncontrolled: Currently asymptomatic. Blood pressure stable. INR measured >10 on admission, now s/p vitamin K 5 mg IV. Patient uncertain on his coumadin dose but denies recent history of bleeds or bloody stools. INR 2.62 today. - Coumadin restarted 11/10. - Coumadin per pharmacy - Daily INRs  #Chronic Wounds/H/o Melanoma Removal of Back, Stable: Sees wound care in [facility] for chronic lower extremity wounds. Melanoma removed 35 years ago on upper back. No evidence concerning for cellulitis or deep tissue infection at this time. - Wound care consult  #Hypertension, Stable: BP 145/65 this morning. Takes Toprolol 50 mg QD at home. - Toprolol 50 mg QD  #Diabetes Mellitus Type 2, controlled: CBGs 84-166 in the last 24 hours. Takes 26 U Lantus, and 0-15 U  Novolog TID at home. A1c 6.7 on 11/08. Do not need tight blood sugar control in  this gentleman. Goal A1c ~8%. - Holding Lantus, plan to restart at 5u QHS at discharge - Change CBGs tid with meals and at bedtime. - Change Sensitive SSI tid with meals and at bedtime.  #Gout, Controlled: Takes allopurinol 150 mg QD. Uric acid low at 0.7. - Holding allopurinol for now given need for HD, consider restarting  #Hypothyroidism: TSH low 0.126. Takes synthroid 137 mcg QD.  - Synthroid to 125 mcg QD, and continue this dose on d/c  #BPH: BP stable. On Flomax at home. - Flomax 0.4 mg QD  FEN/GI: Clear liquid diet, Boost supplements TID, Protonix 40 mg BID Prophylaxis: Coumadin per pharm.  Disposition: PT/OT recommending SNF. Hopeful for placement Today  Subjective:  Patient indicates after starting Remeron he slept all day after receiving new medication (Remeron), felt he had hallucinations with confusion.  Notes the walls looked unusual to him . He has never hallucinated in the past. This morning he is alert and oriented, no physical complaints.   Objective: Temp:  [97.7 F (36.5 C)-98.1 F (36.7 C)] 98.1 F (36.7 C) (11/13 0400) Pulse Rate:  [75-98] 95 (11/13 0400) Resp:  [19-25] 23 (11/13 0400) BP: (117-148)/(65-76) 145/65 (11/13 0400) SpO2:  [99 %-100 %] 100 % (11/13 0400) Weight:  [165 lb 4.8 oz (75 kg)] 165 lb 4.8 oz (75 kg) (11/13 0400) Physical Exam: General: frail elderly male,well developed, in no acute distress with non-toxic appearance, laying in bed Heart:irregularly irregularwithout murmurs, rubs, or gallops Lungs: clear to auscultation bilaterally, normal work of breathing Abdomen: +BS, soft, non-tender, non-distended Skin: warm, dry Extremities: warm and well perfused, normal tone, right ecchymosis on shin, left hallux amputation with dry dressing and w/o erythema or purulence, small erosive lesion on the right great toe dorsal surface w/o erythema or purulence, left-sided upper extremity fistula in place with bruit Neuro: A&O to person, place,  and time   Laboratory:  Recent Labs Lab 02/13/16 0339 02/14/16 0405 02/15/16 0339  WBC 10.8* 9.9 8.5  HGB 12.2* 11.7* 10.9*  HCT 36.6* 35.5* 32.9*  PLT 241 168 179    Recent Labs Lab 02/12/16 1525  02/14/16 0405 02/15/16 0339 02/15/16 1152 02/15/16 2142 02/16/16 0348  NA 135  < > 136 136  --   --  136  K 3.6  < > 3.3* 2.7*  2.7* 4.2 4.0 3.9  CL 100*  < > 103 105  --   --  102  CO2 26  < > 26 23  --   --  26  BUN 28*  < > 16 21*  --   --  9  CREATININE 2.13*  < > 1.35* 1.26*  --   --  1.09  CALCIUM 8.4*  < > 8.0* 7.7*  --   --  7.9*  PROT 5.8*  --   --   --   --   --   --   BILITOT 1.0  --   --   --   --   --   --   ALKPHOS 53  --   --   --   --   --   --   ALT 13*  --   --   --   --   --   --   AST 31  --   --   --   --   --   --  GLUCOSE 297*  < > 106* 61*  --   --  159*  < > = values in this interval not displayed.    Imaging/Diagnostic Tests: Imaging/Diagnostic Tests: DG Chest Port 1 View (02/12/16) FINDINGS: Double lumen central venous catheter in place, unchanged. Heart size and pulmonary vascularity are normal. Lungs are clear. No bone abnormality.  IMPRESSION: No active disease.  US Abdomen Complete (02/13/16) IMPRESSION: 1. Poor visualization of pancreas due to bowel gas. No gross peripancreatic fluid collections. 2. Mildly echogenic liver suggests fatty infiltration 3. Gallbladder is filled with debris/sludge and small stones. Normal wall thickness and normal duct diameter. 4. Spleen and aorta were not able to be evaluated due to bowel gas and patient physical condition. 5. Nonvisualization of native kidneys. Left lower quadrant renal transplant.  US Renal Transplant w/ Doppler (02/13/16) IMPRESSION: 1. Grossly normal Doppler evaluation of left lower quadrant transplanted kidney. 2. Probable echogenic debris within the bladder with mass felt unlikely.   Everrett Coombe, MD 02/17/2016, 6:37 AM PGY-1, [place]  Intern pager: [phone], text pages welcome

## 2016-02-17 NOTE — Progress Notes (Signed)
Inpatient Diabetes Program Recommendations  AACE/ADA: New Consensus Statement on Inpatient Glycemic Control (2015)  Target Ranges:  Prepandial:   less than 140 mg/dL      Peak postprandial:   less than 180 mg/dL (1-2 hours)      Critically ill patients:  140 - 180 mg/dL   Results for Mark [name], Mark [name] (MRN [id number]) as of 02/17/2016 11:26  Ref. Range 02/16/2016 07:14 02/16/2016 11:04 02/16/2016 16:38 02/16/2016 21:23  Glucose-Capillary Latest Ref Range: 65 - 99 mg/dL 172 (H) 320 (H) 226 (H) 258 (H)   Results for Mark [name], Mark [name] (MRN [id number]) as of 02/17/2016 11:26  Ref. Range 02/17/2016 07:31 02/17/2016 11:12  Glucose-Capillary Latest Ref Range: 65 - 99 mg/dL 172 (H) 293 (H)    Home DM Meds: Lantus 26 units QHS       Novolog 0-15 TID per SSI  Current Insulin Orders: Novolog Sensitive Correction Scale/ SSI (0-9 units) TID AC + HS      Note Palliative Care Team met with pt yesterday to establish goals of care.  Fasting glucose slightly elevated but patient having significant glucose elevations.  Already receiving Novolog SSI.    MD- If within goals of care for this patient, please consider starting Novolog Meal Coverage:   Novolog 3 units TIDWC (hold if pt eats <50% of meal)      --Will follow patient during hospitalization--  Wyn Quaker RN, MSN, CDE Diabetes Coordinator Inpatient Glycemic Control Team Team Pager: [phone] (8a-5p)

## 2016-02-17 NOTE — NC FL2 (Signed)
[place] MEDICAID FL2 LEVEL OF CARE SCREENING TOOL     IDENTIFICATION  Patient Name: Mark [name] Birthdate: [birth year] Sex: male Admission Date (Current Location): 02/12/2016  [facility] and [place] Number:  [employer] and Address:  [facility] [place], [place] [address], [place], [place] [address]      Provider Number: [id number]  Attending Physician Name and Address:  Dickie La, MD  Relative Name and Phone Number:  Rhea Pink    Current Level of Care: Hospital Recommended Level of Care: [place] Prior Approval Number:    Date Approved/Denied:   PASRR Number: [id number]  Discharge Plan: SNF    Current Diagnoses: Patient Active Problem List   Diagnosis Date Noted  . Weight decrease   . Palliative care encounter   . Protein-calorie malnutrition, severe 02/14/2016  . Generalized abdominal pain   . General weakness   . Atrial fibrillation with RVR ([place]) 02/12/2016  . Acute pancreatitis 02/12/2016  . ESRD (end stage renal disease) ([place])   . Melena   . CHF (congestive heart failure) ([place])   . Bibasilar crackles   . Aspiration of liquid   . SOB (shortness of breath)   . Permanent atrial fibrillation ([place])   . Renal transplant recipient 10/28/2015  . Diabetic ulcer of left great toe ([place]) 10/28/2015  . Gangrene of toe ([place]) 10/28/2015  . Cellulitis of great toe of left foot 10/28/2015  . Blister of foot with infection 02/22/2015  . Diabetic peripheral neuropathy ([place]) 06/12/2014  . Hammer toe of right foot 02/16/2014  . Onychomycosis 02/02/2014  . Pain in lower limb 02/02/2014  . Ulcer of right foot ([place]) 01/19/2014  . Ulcer of right ankle ([place]) 01/08/2014  . Ulcer of other part of foot 12/15/2013  . Type II diabetes mellitus, uncontrolled ([place]) 10/20/2012  . Other and unspecified hyperlipidemia 10/20/2012  . Chronic kidney disease, stage III (moderate) 10/20/2012  . Unspecified hypothyroidism 10/20/2012  . Edema  extremities 10/20/2012  . Gout 10/20/2012    Orientation RESPIRATION BLADDER Height & Weight     Self, Time, Situation, Place  Normal Continent Weight: 75 kg (165 lb 4.8 oz) Height:  5\' 9"  (175.3 cm)  BEHAVIORAL SYMPTOMS/MOOD NEUROLOGICAL BOWEL NUTRITION STATUS  Other (Comment) (NONE)  (NONE) Incontinent Diet (Soft Diet)  AMBULATORY STATUS COMMUNICATION OF NEEDS Skin   Extensive Assist Verbally Other (Comment) (Right great toe wound (diabetic ulcer). Left great to wound.)                       Personal Care Assistance Level of Assistance  Bathing, Feeding, Dressing Bathing Assistance: Limited assistance Feeding assistance: Independent Dressing Assistance: Limited assistance     Functional Limitations Info  Sight, Hearing, Speech Sight Info: Adequate Hearing Info: Adequate Speech Info: Adequate    SPECIAL CARE FACTORS FREQUENCY  PT (By licensed PT), OT (By licensed OT)     PT Frequency: 5/week OT Frequency: 5/week            Contractures Contractures Info: Not present    Additional Factors Info  Code Status, Allergies, Isolation Precautions, Insulin Sliding Scale, Psychotropic Code Status Info: DNR Allergies Info: TAPE Psychotropic Info: Remeron Insulin Sliding Scale Info: 3/day Isolation Precautions Info: MRSA     Current Medications (02/17/2016):  This is the current hospital active medication list Current Facility-Administered Medications  Medication Dose Route Frequency Provider Last Rate Last Dose  . 0.9 %  sodium chloride infusion  100 mL Intravenous PRN Fleet Contras, MD      . 0.9 %  sodium chloride infusion  100 mL Intravenous PRN Fleet Contras, MD      . acetaminophen (TYLENOL) tablet 650 mg  650 mg Oral Q6H PRN [place] Bing, DO       Or  . acetaminophen (TYLENOL) suppository 650 mg  650 mg Rectal Q6H PRN [place] Bing, DO      . alteplase (CATHFLO ACTIVASE) injection 2 mg  2 mg Intracatheter Once PRN Fleet Contras, MD      .  Chlorhexidine Gluconate Cloth 2 % PADS 6 each  6 each Topical Q0600 Dickie La, MD   6 each at 02/17/16 0600  . feeding supplement (BOOST / RESOURCE BREEZE) liquid 1 Container  1 Container Oral TID WC [place] Bing, DO   1 Container at 02/16/16 1002  . fluconazole (DIFLUCAN) tablet 150 mg  150 mg Oral Daily [place] Bing, DO   150 mg at 02/17/16 [id number]  . heparin injection 1,000 Units  1,000 Units Dialysis PRN Fleet Contras, MD      . heparin injection 1,800 Units  20 Units/kg Dialysis PRN Fleet Contras, MD      . insulin aspart (novoLOG) injection 0-5 Units  0-5 Units Subcutaneous QHS Sela Hua, MD   3 Units at 02/16/16 2230  . insulin aspart (novoLOG) injection 0-9 Units  0-9 Units Subcutaneous TID WC Sela Hua, MD   2 Units at 02/17/16 [id number]  . levalbuterol (XOPENEX) nebulizer solution 1.25 mg  1.25 mg Nebulization Q6H PRN [place] Bing, DO      . levothyroxine (SYNTHROID, LEVOTHROID) tablet 125 mcg  125 mcg Oral QAC breakfast Everrett Coombe, MD   125 mcg at 02/17/16 0841  . lidocaine (PF) (XYLOCAINE) 1 % injection 5 mL  5 mL Intradermal PRN Fleet Contras, MD      . lidocaine-prilocaine (EMLA) cream 1 application  1 application Topical PRN Fleet Contras, MD      . metoprolol succinate (TOPROL-XL) 24 hr tablet 50 mg  50 mg Oral Daily [place] Bing, DO   50 mg at 02/17/16 0906  . mirtazapine (REMERON) tablet 15 mg  15 mg Oral QHS Dory Horn, NP   15 mg at 02/16/16 2229  . multivitamin (RENA-VIT) tablet 1 tablet  1 tablet Oral QHS [place] Bing, DO   1 tablet at 02/16/16 2229  . mupirocin ointment (BACTROBAN) 2 % 1 application  1 application Nasal BID Dickie La, MD   1 application at [id number] [phone]  . ondansetron (ZOFRAN) tablet 4 mg  4 mg Oral Q6H PRN [place] Bing, DO       Or  . ondansetron [facility]) injection 4 mg  4 mg Intravenous Q6H PRN [place] Bing, DO      . pantoprazole (PROTONIX) EC tablet 40 mg  40 mg Oral BID [place] Bing, DO   40  mg at 02/17/16 0906  . pentafluoroprop-tetrafluoroeth (GEBAUERS) aerosol 1 application  1 application Topical PRN Fleet Contras, MD      . polyethylene glycol (MIRALAX / GLYCOLAX) packet 17 g  17 g Oral Daily PRN  Bing, DO      . predniSONE (DELTASONE) tablet 5 mg  5 mg Oral Daily Everrett Coombe, MD   5 mg at 02/17/16 0906  . rosuvastatin (CRESTOR) tablet 5 mg  5 mg Oral QPM [place] McMullen, DO   5 mg at  02/16/16 1754  . sodium chloride flush (NS) 0.9 % injection 3 mL  3 mL Intravenous Q12H [place] Bing, DO   3 mL at 02/17/16 0909  . tacrolimus (PROGRAF) capsule 0.5 mg  0.5 mg Oral BID [place] Bing, DO   0.5 mg at 02/17/16 [id number]  . tamsulosin (FLOMAX) capsule 0.4 mg  0.4 mg Oral QPC supper [place] Bing, DO   0.4 mg at 02/16/16 1754  . vitamin B-12 (CYANOCOBALAMIN) tablet 100 mcg  100 mcg Oral Daily [place] Bing, DO   100 mcg at 02/17/16 [id number]  . Warfarin - Pharmacist Dosing Inpatient   Does not apply [place], [facility]         Discharge Medications: Please see discharge summary for a list of discharge medications.  Relevant Imaging Results:  Relevant Lab Results:   Additional Information Pt has weekly HD schedule TTS [facility] in [place], [place]  Mark [name], [place]

## 2016-02-17 NOTE — Progress Notes (Signed)
I had talked with the patient this am about holding HD to see if he has regained kidney function, however now he has been dc'd to SNF; will not be holding HD in the OP setting as this is not safe w/o close observation.  Will d/w [facility].  He should go to his OP HD tomorrow.  Kelly Splinter MD [employer] pgr [phone]   02/17/2016, 4:05 PM

## 2016-02-17 NOTE — Progress Notes (Signed)
Daily Progress Note   Patient Name: Mark [name]       Date: 02/17/2016 DOB: Dec 27, 1940  Age: 75 y.o. MRN#: [id number] Attending Physician: Dickie La, MD Primary Care Physician: Rochel Brome, MD Admit Date: 02/12/2016  Reason for Consultation/Follow-up: Disposition and Non pain symptom management  Subjective: Mark [name] was much more lethargic this morning compared to yesterday. Mark [name] endorsed poor sleep overnight, notably after Mark [name] woke up in the early morning and had trouble reorienting. Mark [name] was also very upset about a bad experience Mark [name] had getting his blood drawn this morning.   Length of Stay: 5  Current Medications: Scheduled Meds:  . Chlorhexidine Gluconate Cloth  6 each Topical Q0600  . feeding supplement  1 Container Oral TID WC  . fluconazole  150 mg Oral Daily  . insulin aspart  0-5 Units Subcutaneous QHS  . insulin aspart  0-9 Units Subcutaneous TID WC  . levothyroxine  125 mcg Oral QAC breakfast  . metoprolol succinate  50 mg Oral Daily  . multivitamin  1 tablet Oral QHS  . mupirocin ointment  1 application Nasal BID  . pantoprazole  40 mg Oral BID  . predniSONE  5 mg Oral Daily  . rosuvastatin  5 mg Oral QPM  . sodium chloride flush  3 mL Intravenous Q12H  . tacrolimus  0.5 mg Oral BID  . tamsulosin  0.4 mg Oral QPC supper  . vitamin B-12  100 mcg Oral Daily  . Warfarin - Pharmacist Dosing Inpatient   Does not apply q1800    Continuous Infusions:  PRN Meds: sodium chloride, sodium chloride, acetaminophen **OR** acetaminophen, alteplase, heparin, heparin, levalbuterol, lidocaine (PF), lidocaine-prilocaine, ondansetron **OR** ondansetron (ZOFRAN) IV, pentafluoroprop-tetrafluoroeth, polyethylene glycol  Physical Exam  Constitutional: Mark [name] is oriented to person, place, and time. Mark [name] appears lethargic. Mark [name] has a sickly  appearance.  Eyes: EOM are normal.  Neck: Normal range of motion.  Pulmonary/Chest: Effort normal.  Abdominal: Soft. There is no tenderness. There is no guarding.  Neurological: Mark [name] is oriented to person, place, and time. Mark [name] appears lethargic.  Skin: Skin is warm and dry. There is pallor.  Psychiatric: His speech is normal. Judgment and thought content normal. Mark [name] is agitated and slowed. Cognition and memory are normal.  Perseverated on negative experience with blood draw           Vital Signs: BP 119/69   Pulse 90   Temp 98.1 F (36.7 C)   Resp (!) 23   Ht 5\' 9"  (1.753 m)   Wt 75 kg (165 lb 4.8 oz)   SpO2 100%   BMI 24.41 kg/m  SpO2: SpO2: 100 % O2 Device: O2 Device: Not Delivered O2 Flow Rate:    Intake/output summary:  Intake/Output Summary (Last 24 hours) at 02/17/16 1140 Last data filed at 02/17/16 0850  Gross per 24 hour  Intake              720 ml  Output              400 ml  Net              320 ml   LBM: Last BM Date: 02/16/16 Baseline Weight: Weight:  72.6 kg (160 lb) Most recent weight: Weight: 75 kg (165 lb 4.8 oz)  Palliative Assessment/Data:  PPS 50%   Flowsheet Rows   Flowsheet Row Most Recent Value  Intake Tab  Referral Department  -- [internal medicine]  Unit at Time of Referral  Cardiac/Telemetry Unit  Palliative Care Primary Diagnosis  Nephrology  Date Notified  02/15/16  Palliative Care Type  New Palliative care  Reason for referral  Clarify Goals of Care  Date of Admission  02/12/16  Date first seen by Palliative Care  02/16/16  # of days Palliative referral response time  1 Day(s)  # of days IP prior to Palliative referral  3  Clinical Assessment  Psychosocial & Spiritual Assessment  Palliative Care Outcomes      Patient Active Problem List   Diagnosis Date Noted  . Weight decrease   . Palliative care encounter   . Protein-calorie malnutrition, severe 02/14/2016  . Generalized abdominal pain   . General weakness   . Atrial  fibrillation with RVR ([place]) 02/12/2016  . Acute pancreatitis 02/12/2016  . ESRD (end stage renal disease) ([place])   . Melena   . CHF (congestive heart failure) ([place])   . Bibasilar crackles   . Aspiration of liquid   . SOB (shortness of breath)   . Permanent atrial fibrillation ([place])   . Renal transplant recipient 10/28/2015  . Diabetic ulcer of left great toe ([place]) 10/28/2015  . Gangrene of toe ([place]) 10/28/2015  . Cellulitis of great toe of left foot 10/28/2015  . Blister of foot with infection 02/22/2015  . Diabetic peripheral neuropathy ([place]) 06/12/2014  . Hammer toe of right foot 02/16/2014  . Onychomycosis 02/02/2014  . Pain in lower limb 02/02/2014  . Ulcer of right foot ([place]) 01/19/2014  . Ulcer of right ankle ([place]) 01/08/2014  . Ulcer of other part of foot 12/15/2013  . Type II diabetes mellitus, uncontrolled ([place]) 10/20/2012  . Other and unspecified hyperlipidemia 10/20/2012  . Chronic kidney disease, stage III (moderate) 10/20/2012  . Unspecified hypothyroidism 10/20/2012  . Edema extremities 10/20/2012  . Gout 10/20/2012    Palliative Care Assessment & Plan   HPI: 75 y.o. male  with past medical history of ESRD (kidney transplant 2004), HTN, A. FIB, DM2 (s/p L hallux amputaion), hypothyroidism, gout, and chronic wounds who was admitted on 02/12/2016 with weakness and abdominal pain. There was concern for acute rejection of transplanted kidney, although renal US without evidence of rejection. Mark [name] did have mild pancreatitis, which has improved. Overall, since starting dialysis in July Mark [name] has experienced a progressive decline in his functional status, with increasing weakness and weight loss.   Assessment: Please see initial consult note on 11/12. In brief, Mark [name] is struggling with profound existential suffering related to the loss of his close family and friends, progressive health issues that Mark [name] holds himself at fault for, and loss of life purpose. Mark [name] did express a clear  care endpoint, which is that Mark [name] would not want to continue dialysis if his kidney transplant has entirely failed and dialysis would be considered an indefinite necessity.   Yesterday, we did a trial of Remeron to see if it would aid in improved sleep and appetite stimulation. Unfortunately, Mark [name] did not tolerate the medication well and it was stopped. Mark [name] had also expressed his desire to return home with home health support at discharge. After working with PT and discussing with his daughter, Mark [name] has since decided that SNF with rehab may be more  beneficial and a better discharge plan.   Recommendations/Plan: -Agree with stopping Remeron, which primary team has discontinued  -Plan for d/c to SNF/rehab per primary team, Mark [name] is clear on now wanting this; would recommend Palliative care at SNF -Continue clear discussions with Nephrology team about present and future direction of care; Mark [name] is still working under the assumption that there is an endpoint to dialysis.  -CM notified of pt's frustration with morning blood draw, she plans to follow-up with appropriate parties and report back to pt  Goals of Care and Additional Recommendations:  Limitations on Scope of Treatment: Full Scope Treatment  Code Status:  DNR  Prognosis:  Unable to determine; Pt would qualify for Hospice if and when Mark [name] stops dialysis.  Discharge Planning:  [place] for rehab with Palliative care service follow-up  Care plan was discussed with Mark [name] and Imogene Burn PA  Thank you for allowing the Palliative Medicine Team to assist in the care of this patient.   Time In: 1115 Time Out: 1140 Total Time 25 minutes Prolonged Time Billed  no       Greater than 50%  of this time was spent counseling and coordinating care related to the above assessment and plan.  Charlynn Court, NP Palliative Medicine Team Team Phone # [phone]

## 2016-02-17 NOTE — Progress Notes (Signed)
   02/17/16 1117  Clinical Encounter Type  Visited With Patient  Visit Type Initial;Spiritual support  Referral From Chaplain  Consult/Referral To Chaplain  Spiritual Encounters  Spiritual Needs Prayer;Emotional  Stress Factors  Patient Stress Factors Major life changes  Pt. Is saddened by condition, being in hospital again, had kidney surgery 12 yrs ago, loss of mobility,not sleeping well, sleeping medication for lack of sleep caused hallucination, had a rough night sleeping, exhausted, pt does have a faith, has family support, children and sister-in-law.  Pt. Might be discharged today.  Chaplain offered a ministry of presence, prayer, spiritual and emotional support.  [employer] [phone]

## 2016-02-17 NOTE — Care Management Note (Signed)
Case Management Note  Patient Details  Name: Mark [name] MRN: [id number] Date of Birth: Jun 25, 1940  Subjective/Objective: Pt presented for Atrial Fib. Plan will be to d/c to [facility].                    Action/Plan: CSW assisting with disposition needs. No needs from CM at this time.   Expected Discharge Date:                  Expected Discharge Plan:  Skilled Nursing Facility  In-House Referral:  Clinical Social Work  Discharge planning Services  CM Consult  Post Acute Care Choice:  NA Choice offered to:  NA  DME Arranged:  N/A DME Agency:  NA  HH Arranged:  NA HH Agency:  NA  Status of Service:  Completed, signed off  If discussed at [place] of Stay Meetings, dates discussed:    Additional Comments:  Bethena Roys, RN 02/17/2016, 1:53 PM

## 2016-02-17 NOTE — Progress Notes (Signed)
[facility] Progress Note   Subjective: in good spiritis  Vitals:   02/16/16 1354 02/16/16 2100 02/17/16 0400 02/17/16 0906  BP: 117/68 (!) 148/76 (!) 145/65 119/69  Pulse: 98 75 95 90  Resp: (!) 25 (!) 23 (!) 23   Temp: 98.1 F (36.7 C) 97.7 F (36.5 C) 98.1 F (36.7 C)   TempSrc: Oral     SpO2: 100% 99% 100%   Weight:   75 kg (165 lb 4.8 oz)   Height:        Inpatient medications: . Chlorhexidine Gluconate Cloth  6 each Topical Q0600  . feeding supplement  1 Container Oral TID WC  . fluconazole  150 mg Oral Daily  . insulin aspart  0-5 Units Subcutaneous QHS  . insulin aspart  0-9 Units Subcutaneous TID WC  . levothyroxine  125 mcg Oral QAC breakfast  . metoprolol succinate  50 mg Oral Daily  . multivitamin  1 tablet Oral QHS  . mupirocin ointment  1 application Nasal BID  . pantoprazole  40 mg Oral BID  . predniSONE  5 mg Oral Daily  . rosuvastatin  5 mg Oral QPM  . sodium chloride flush  3 mL Intravenous Q12H  . tacrolimus  0.5 mg Oral BID  . tamsulosin  0.4 mg Oral QPC supper  . vitamin B-12  100 mcg Oral Daily  . Warfarin - Pharmacist Dosing Inpatient   Does not apply q1800    acetaminophen **OR** acetaminophen, alteplase, heparin, levalbuterol, ondansetron **OR** ondansetron (ZOFRAN) IV, polyethylene glycol  Exam: Alert elderly WM, no distress Dry mouth No jvd Chest clear bilat RRR no mrg Abd soft scaphoid ntnd +bs Ext no LE edema LUA AVF +bruit/ R IJ TDC   UA 11/8 > cloudy, rare bact, large LE, 6-30 WBC / 0-5 rbc, yeast present, 1.017, pH 5.5 CXR 02/12/16 - on admission, no active disease  Dialysis: [facility]        4h  79kg (left at 77kg)   4/2.25 bath   Hep 3600   R IJ/ L BCF placed 7/17      Assessment: 1. Abd pain - ddx pancreatitis, gastritis, acute rejection of Tx kidney, ischemic bowel.  Looking and feeling better.   2. 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  Hx renal Tx with CKD4, baseline creat 1.7- 2.2, during Jul-Aug  admission this year creat went up to 4 and pt was started on HD.  Said to be contrast nephropathy.   3. Volume - looks dry, under his dry wt 4. Afib 5. DM w neuropathy 6. HTN  Plan - plan is to hold off on HD for a few days, follow serial BUN/ creat and see how he does.     Kelly Splinter MD [facility] pager [phone]   02/17/2016, 12:21 PM    Recent Labs Lab 02/14/16 0405 02/15/16 0339  02/15/16 2142 02/16/16 0348 02/17/16 1043  NA 136 136  --   --  136 134*  K 3.3* 2.7*  2.7*  < > 4.0 3.9 3.4*  CL 103 105  --   --  102 102  CO2 26 23  --   --  26 25  GLUCOSE 106* 61*  --   --  159* 210*  BUN 16 21*  --   --  9 15  CREATININE 1.35* 1.26*  --   --  1.09 1.52*  CALCIUM 8.0* 7.7*  --   --  7.9*  8.3*  PHOS 2.4* 2.3*  --   --  2.2*  --   < > = values in this interval not displayed.  Recent Labs Lab 02/12/16 1525  02/13/16 0339 02/14/16 0405 02/16/16 0348  AST 31  --   --   --   --   ALT 13*  --   --   --   --   ALKPHOS 53  --   --   --   --   BILITOT 1.0  --   --   --   --   PROT 5.8*  --   --   --   --   ALBUMIN 2.3*  < > 2.0* 1.9* 1.8*  < > = values in this interval not displayed.  Recent Labs Lab 02/12/16 1525  02/14/16 0405 02/15/16 0339 02/17/16 1043  WBC 10.1  < > 9.9 8.5 6.2  NEUTROABS 7.7  --   --   --   --   HGB 12.9*  < > 11.7* 10.9* 11.6*  HCT 39.7  < > 35.5* 32.9* 35.1*  MCV 88.4  < > 88.8 87.7 88.4  PLT 235  < > 168 179 164  < > = values in this interval not displayed. Iron/TIBC/Ferritin/ %Sat    Component Value Date/Time   IRON 28 (L) 10/29/2015 1233   TIBC 189 (L) 10/29/2015 1233   IRONPCTSAT 15 (L) 10/29/2015 1233

## 2016-02-17 NOTE — Progress Notes (Signed)
Report given to nurse Debra at [facility]. Pt is hemodynamically stable. Transported to [facility] via EMS.

## 2016-02-17 NOTE — Clinical Social Work Placement (Addendum)
   CLINICAL SOCIAL WORK PLACEMENT  NOTE  Date:  02/17/2016  Patient Details  Name: Mark [name] MRN: [id number] Date of Birth: [birth year]  Clinical Social Work is seeking post-discharge placement for this patient at the [place] level of care (*CSW will initial, date and re-position this form in  chart as items are completed):  Yes   Patient/family provided with [facility] Work Department's list of facilities offering this level of care within the geographic area requested by the patient (or if unable, by the patient's family).  Yes   Patient/family informed of their freedom to choose among providers that offer the needed level of care, that participate in Medicare, Medicaid or managed care program needed by the patient, have an available bed and are willing to accept the patient.  Yes   Patient/family informed of [facility]'s ownership interest in [facility] and [facility], as well as of the fact that they are under no obligation to receive care at these facilities.  PASRR submitted to EDS on 02/15/16     PASRR number received on       Existing PASRR number confirmed on       FL2 transmitted to all facilities in geographic area requested by pt/family on       FL2 transmitted to all facilities within larger geographic area on 02/15/16     Patient informed that his/her managed care company has contracts with or will negotiate with certain facilities, including the following:        Yes   Patient/family informed of bed offers received.  Patient chooses bed at [facility] [place]     Physician recommends and patient chooses bed at      Patient to be transferred to [facility] on 02/17/16.  Patient to be transferred to facility by Ambulance     Patient family notified on 02/17/16 of transfer.  Name of family member notified:  Deedee by message     PHYSICIAN Please prepare priority discharge summary, including  medications, Please prepare prescriptions, Please sign FL2, Please sign DNR     Additional Comment:  Per MD patient ready for DC to [facility] [place]. RN, patient, patient's family, and facility notified of DC. RN given number for report. DC packet on chart. Ambulance transport requested for patient. The patient HD was successfully switched to [facility] for MWF. MD made aware. CSW signing off.    UPDATE: Call received from daughter Deedee. She was informed of the above. CSW signing off. _______________________________________________ Rigoberto Noel, LCSW 02/17/2016, 3:01 PM

## 2016-02-17 NOTE — Discharge Instructions (Signed)
It was so nice to meet you!  We have changed your Lantus to 5 units daily. We think your blood sugar was too tightly controlled. We have also stopped your Novolog. Please keep checking your blood sugars. Your provider will adjust these doses as appropriate.  You had a yeast infection in your urine. We prescribed Diflucan. Please take 1 tablet daily for the next 4 days.  Your INRs were elevated, probably because the Diflucan increases the potency of the Warfarin. We discharged you on Warfarin 1mg  daily. You need to have your INR rechecked tomorrow. Your provider will adjust these doses as needed.  We decreased your Synthroid dose from 145mcg daily to 139mcg daily because it looked like your dose was too high.  Warfarin tablets What is this medicine? WARFARIN (WAR far in) is an anticoagulant. It is used to treat or prevent clots in the veins, arteries, lungs, or heart. This medicine may be used for other purposes; ask your health care provider or pharmacist if you have questions. What should I tell my health care provider before I take this medicine? They need to know if you have any of these conditions: -alcoholism -anemia -bleeding disorders -cancer -diabetes -heart disease -high blood pressure -history of bleeding in the gastrointestinal tract -history of stroke or other brain injury or disease -kidney or liver disease -protein C deficiency -protein S deficiency -psychosis or dementia -recent injury, recent or planned surgery or procedure -an unusual or allergic reaction to warfarin, other medicines, foods, dyes, or preservatives -pregnant or trying to get pregnant -breast-feeding How should I use this medicine? Take this medicine by mouth with a glass of water. Follow the directions on the prescription label. You can take this medicine with or without food. Take your medicine at the same time each day. Do not take it more often than directed. Do not stop taking except on your  doctor's advice. Stopping this medicine may increase your risk of a blood clot. Be sure to refill your prescription before you run out of medicine. If your doctor or healthcare professional calls to change your dose, write down the dose and any other instructions. Always read the dose and instructions back to him or her to make sure you understand them. Tell your doctor or healthcare professional what strength of tablets you have on hand. Ask how many tablets you should take to equal your new dose. Write the date on the new instructions and keep them near your medicine. If you are told to stop taking your medicine until your next blood test, call your doctor or healthcare professional if you do not hear anything within 24 hours of the test to find out your new dose or when to restart your prior dose. A special MedGuide will be given to you by the pharmacist with each prescription and refill. Be sure to read this information carefully each time. Talk to your pediatrician regarding the use of this medicine in children. Special care may be needed. Overdosage: If you think you have taken too much of this medicine contact a poison control center or emergency room at once. NOTE: This medicine is only for you. Do not share this medicine with others. What if I miss a dose? It is important not to miss a dose. If you miss a dose, call your healthcare provider. Take the dose as soon as possible on the same day. If it is almost time for your next dose, take only that dose. Do not take double or extra  doses to make up for a missed dose. What may interact with this medicine? Do not take this medicine with any of the following medications: -agents that prevent or dissolve blood clots -aspirin or other salicylates -danshen -dextrothyroxine -mifepristone -St. John's Wort -red yeast rice This medicine may also interact with the following medications: -acetaminophen -agents that lower  cholesterol -alcohol -allopurinol -amiodarone -antibiotics or medicines for treating bacterial, fungal or viral infections -azathioprine -barbiturate medicines for inducing sleep or treating seizures -certain medicines for diabetes -certain medicines for heart rhythm problems -certain medicines for high blood pressure -chloral hydrate -cisapride -disulfiram -male hormones, including contraceptive or birth control pills -general anesthetics -herbal or dietary products like garlic, ginkgo, ginseng, green tea, or kava kava -influenza virus vaccine -male hormones -medicines for mental depression or psychosis -medicines for some types of cancer -medicines for stomach problems -methylphenidate -NSAIDs, medicines for pain and inflammation, like ibuprofen or naproxen -propoxyphene -quinidine, quinine -raloxifene -seizure or epilepsy medicine like carbamazepine, phenytoin, and valproic acid -steroids like cortisone and prednisone -tamoxifen -thyroid medicine -tramadol -vitamin c, vitamin e, and vitamin K -zafirlukast -zileuton This list may not describe all possible interactions. Give your health care provider a list of all the medicines, herbs, non-prescription drugs, or dietary supplements you use. Also tell them if you smoke, drink alcohol, or use illegal drugs. Some items may interact with your medicine. What should I watch for while using this medicine? Visit your doctor or health care professional for regular checks on your progress. You will need to have a blood test called a PT/INR regularly. The PT/INR blood test is done to make sure you are getting the right dose of this medicine. It is important to not miss your appointment for the blood tests. When you first start taking this medicine, these tests are done often. Once the correct dose is determined and you take your medicine properly, these tests can be done less often. Notify your doctor or health care professional and seek  emergency treatment if you develop breathing problems; changes in vision; chest pain; severe, sudden headache; pain, swelling, warmth in the leg; trouble speaking; sudden numbness or weakness of the face, arm or leg. These can be signs that your condition has gotten worse. While you are taking this medicine, carry an identification card with your name, the name and dose of medicine(s) being used, and the name and phone number of your doctor or health care professional or person to contact in an emergency. Do not start taking or stop taking any medicines or over-the-counter medicines except on the advice of your doctor or health care professional. You should discuss your diet with your doctor or health care professional. Do not make major changes in your diet. Vitamin K can affect how well this medicine works. Many foods contain vitamin K. It is important to eat a consistent amount of foods with vitamin K. Other foods with vitamin K that you should eat in consistent amounts are asparagus, basil, beef or pork liver, black eyed peas, broccoli, brussel sprouts, cabbage, chickpeas, cucumber with peel, green onions, green tea, okra, parsley, peas, thyme, and green leafy vegetables like beet greens, collard greens, endive, kale, mustard greens, spinach, turnip greens, watercress, or certain lettuces like green leaf or romaine. This medicine can cause birth defects or bleeding in an unborn child. Women of childbearing age should use effective birth control while taking this medicine. If a woman becomes pregnant while taking this medicine, she should discuss the potential risks and her options  with her health care professional. Avoid sports and activities that might cause injury while you are using this medicine. Severe falls or injuries can cause unseen bleeding. Be careful when using sharp tools or knives. Consider using an Copy. Take special care brushing or flossing your teeth. Report any injuries,  bruising, or red spots on the skin to your doctor or health care professional. If you have an illness that causes vomiting, diarrhea, or fever for more than a few days, contact your doctor. Also check with your doctor if you are unable to eat for several days. These problems can change the effect of this medicine. Even after you stop taking this medicine, it takes several days before your body recovers its normal ability to clot blood. Ask your doctor or health care professional how long you need to be careful. If you are going to have surgery or dental work, tell your doctor or health care professional that you have been taking this medicine. What side effects may I notice from receiving this medicine? Side effects that you should report to your doctor or health care professional as soon as possible: -back pain -chills -dizziness -fever -heavy menstrual bleeding or vaginal bleeding -painful, blue, or purple toes -painful, prolonged erection -signs and symptoms of bleeding such as bloody or black, tarry stools; red or dark-brown urine; spitting up blood or brown material that looks like coffee grounds; red spots on the skin; unusual bruising or bleeding from the eye, gums, or nose-skin rash, itching or skin damage -stomach pain -unusually weak or tired -yellowing of skin or eyes Side effects that usually do not require medical attention (report to your doctor or health care professional if they continue or are bothersome): -diarrhea -hair loss This list may not describe all possible side effects. Call your doctor for medical advice about side effects. You may report side effects to FDA at 1-800-FDA-1088. Where should I keep my medicine? Keep out of the reach of children. Store at room temperature between 15 and 30 degrees C (59 and 86 degrees F). Protect from light. Throw away any unused medicine after the expiration date. Do not flush down the toilet. NOTE: This sheet is a summary. It may not  cover all possible information. If you have questions about this medicine, talk to your doctor, pharmacist, or health care provider.

## 2016-02-17 NOTE — Progress Notes (Deleted)
[facility] Progress Note   Subjective: in good spiritis  Vitals:   02/16/16 1354 02/16/16 2100 02/17/16 0400 02/17/16 0906  BP: 117/68 (!) 148/76 (!) 145/65 119/69  Pulse: 98 75 95 90  Resp: (!) 25 (!) 23 (!) 23   Temp: 98.1 F (36.7 C) 97.7 F (36.5 C) 98.1 F (36.7 C)   TempSrc: Oral     SpO2: 100% 99% 100%   Weight:   75 kg (165 lb 4.8 oz)   Height:        Inpatient medications: . Chlorhexidine Gluconate Cloth  6 each Topical Q0600  . feeding supplement  1 Container Oral TID WC  . fluconazole  150 mg Oral Daily  . insulin aspart  0-5 Units Subcutaneous QHS  . insulin aspart  0-9 Units Subcutaneous TID WC  . levothyroxine  125 mcg Oral QAC breakfast  . metoprolol succinate  50 mg Oral Daily  . multivitamin  1 tablet Oral QHS  . mupirocin ointment  1 application Nasal BID  . pantoprazole  40 mg Oral BID  . predniSONE  5 mg Oral Daily  . rosuvastatin  5 mg Oral QPM  . sodium chloride flush  3 mL Intravenous Q12H  . tacrolimus  0.5 mg Oral BID  . tamsulosin  0.4 mg Oral QPC supper  . vitamin B-12  100 mcg Oral Daily  . Warfarin - Pharmacist Dosing Inpatient   Does not apply q1800    sodium chloride, sodium chloride, acetaminophen **OR** acetaminophen, alteplase, heparin, heparin, levalbuterol, lidocaine (PF), lidocaine-prilocaine, ondansetron **OR** ondansetron (ZOFRAN) IV, pentafluoroprop-tetrafluoroeth, polyethylene glycol  Exam: Alert elderly WM, no distress Dry mouth No jvd Chest clear bilat RRR no mrg Abd soft scaphoid ntnd +bs Ext no LE edema LUA AVF +bruit/ R IJ TDC   UA 11/8 > cloudy, rare bact, large LE, 6-30 WBC / 0-5 rbc, yeast present, 1.017, pH 5.5 CXR 02/12/16 - on admission, no active disease  Dialysis: [facility]        4h  79kg (left at 77kg)   4/2.25 bath   Hep 3600   R IJ/ L BCF placed 7/17      Assessment: 1. Abd pain - ddx pancreatitis, gastritis, acute rejection of Tx kidney, ischemic bowel.  Looking and feeling better.    2. 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  Hx renal Tx with CKD4, baseline creat 1.7- 2.2, during Jul-Aug admission this year creat went up to 4 and pt was started on HD.  Said to be contrast nephropathy.   3. Volume - looks dry, under his dry wt 4. Afib 5. DM w neuropathy 6. HTN  Plan - plan is to hold off on HD for a few days, follow serial BUN/ creat and see how he does.     Kelly Splinter MD [facility] pager [phone]   02/17/2016, 12:21 PM    Recent Labs Lab 02/14/16 0405 02/15/16 0339  02/15/16 2142 02/16/16 0348 02/17/16 1043  NA 136 136  --   --  136 134*  K 3.3* 2.7*  2.7*  < > 4.0 3.9 3.4*  CL 103 105  --   --  102 102  CO2 26 23  --   --  26 25  GLUCOSE 106* 61*  --   --  159* 210*  BUN 16 21*  --   --  9 15  CREATININE 1.35* 1.26*  --   --  1.09 1.52*  CALCIUM  8.0* 7.7*  --   --  7.9* 8.3*  PHOS 2.4* 2.3*  --   --  2.2*  --   < > = values in this interval not displayed.  Recent Labs Lab 02/12/16 1525  02/13/16 0339 02/14/16 0405 02/16/16 0348  AST 31  --   --   --   --   ALT 13*  --   --   --   --   ALKPHOS 53  --   --   --   --   BILITOT 1.0  --   --   --   --   PROT 5.8*  --   --   --   --   ALBUMIN 2.3*  < > 2.0* 1.9* 1.8*  < > = values in this interval not displayed.  Recent Labs Lab 02/12/16 1525  02/14/16 0405 02/15/16 0339 02/17/16 1043  WBC 10.1  < > 9.9 8.5 6.2  NEUTROABS 7.7  --   --   --   --   HGB 12.9*  < > 11.7* 10.9* 11.6*  HCT 39.7  < > 35.5* 32.9* 35.1*  MCV 88.4  < > 88.8 87.7 88.4  PLT 235  < > 168 179 164  < > = values in this interval not displayed. Iron/TIBC/Ferritin/ %Sat    Component Value Date/Time   IRON 28 (L) 10/29/2015 1233   TIBC 189 (L) 10/29/2015 1233   IRONPCTSAT 15 (L) 10/29/2015 1233

## 2016-02-18 DIAGNOSIS — L97512 Non-pressure chronic ulcer of other part of right foot with fat layer exposed: Secondary | ICD-10-CM | POA: Diagnosis not present

## 2016-02-18 DIAGNOSIS — E43 Unspecified severe protein-calorie malnutrition: Secondary | ICD-10-CM | POA: Diagnosis not present

## 2016-02-18 DIAGNOSIS — N186 End stage renal disease: Secondary | ICD-10-CM | POA: Diagnosis not present

## 2016-02-18 DIAGNOSIS — I4891 Unspecified atrial fibrillation: Secondary | ICD-10-CM | POA: Diagnosis not present

## 2016-02-18 DIAGNOSIS — R634 Abnormal weight loss: Secondary | ICD-10-CM | POA: Diagnosis not present

## 2016-02-18 DIAGNOSIS — F322 Major depressive disorder, single episode, severe without psychotic features: Secondary | ICD-10-CM | POA: Diagnosis not present

## 2016-02-18 DIAGNOSIS — L97522 Non-pressure chronic ulcer of other part of left foot with fat layer exposed: Secondary | ICD-10-CM | POA: Diagnosis not present

## 2016-02-18 DIAGNOSIS — T8189XD Other complications of procedures, not elsewhere classified, subsequent encounter: Secondary | ICD-10-CM | POA: Diagnosis not present

## 2016-02-19 ENCOUNTER — Ambulatory Visit: Payer: Medicare Other | Admitting: Internal Medicine

## 2016-02-19 DIAGNOSIS — E1129 Type 2 diabetes mellitus with other diabetic kidney complication: Secondary | ICD-10-CM | POA: Diagnosis not present

## 2016-02-19 DIAGNOSIS — N186 End stage renal disease: Secondary | ICD-10-CM | POA: Diagnosis not present

## 2016-02-21 DIAGNOSIS — E1129 Type 2 diabetes mellitus with other diabetic kidney complication: Secondary | ICD-10-CM | POA: Diagnosis not present

## 2016-02-21 DIAGNOSIS — N186 End stage renal disease: Secondary | ICD-10-CM | POA: Diagnosis not present

## 2016-02-23 DIAGNOSIS — N186 End stage renal disease: Secondary | ICD-10-CM | POA: Diagnosis not present

## 2016-02-23 DIAGNOSIS — E1129 Type 2 diabetes mellitus with other diabetic kidney complication: Secondary | ICD-10-CM | POA: Diagnosis not present

## 2016-02-25 DIAGNOSIS — L97512 Non-pressure chronic ulcer of other part of right foot with fat layer exposed: Secondary | ICD-10-CM | POA: Diagnosis not present

## 2016-02-25 DIAGNOSIS — T8189XD Other complications of procedures, not elsewhere classified, subsequent encounter: Secondary | ICD-10-CM | POA: Diagnosis not present

## 2016-02-25 DIAGNOSIS — L97522 Non-pressure chronic ulcer of other part of left foot with fat layer exposed: Secondary | ICD-10-CM | POA: Diagnosis not present

## 2016-02-26 DIAGNOSIS — B351 Tinea unguium: Secondary | ICD-10-CM | POA: Diagnosis not present

## 2016-02-26 DIAGNOSIS — E1142 Type 2 diabetes mellitus with diabetic polyneuropathy: Secondary | ICD-10-CM | POA: Diagnosis not present

## 2016-02-26 DIAGNOSIS — I739 Peripheral vascular disease, unspecified: Secondary | ICD-10-CM | POA: Diagnosis not present

## 2016-02-26 DIAGNOSIS — G63 Polyneuropathy in diseases classified elsewhere: Secondary | ICD-10-CM | POA: Diagnosis not present

## 2016-02-28 DIAGNOSIS — E1129 Type 2 diabetes mellitus with other diabetic kidney complication: Secondary | ICD-10-CM | POA: Diagnosis not present

## 2016-02-28 DIAGNOSIS — N186 End stage renal disease: Secondary | ICD-10-CM | POA: Diagnosis not present

## 2016-03-02 DIAGNOSIS — N186 End stage renal disease: Secondary | ICD-10-CM | POA: Diagnosis not present

## 2016-03-02 DIAGNOSIS — E1129 Type 2 diabetes mellitus with other diabetic kidney complication: Secondary | ICD-10-CM | POA: Diagnosis not present

## 2016-03-03 DIAGNOSIS — L97512 Non-pressure chronic ulcer of other part of right foot with fat layer exposed: Secondary | ICD-10-CM | POA: Diagnosis not present

## 2016-03-03 DIAGNOSIS — L97522 Non-pressure chronic ulcer of other part of left foot with fat layer exposed: Secondary | ICD-10-CM | POA: Diagnosis not present

## 2016-03-03 DIAGNOSIS — T8189XD Other complications of procedures, not elsewhere classified, subsequent encounter: Secondary | ICD-10-CM | POA: Diagnosis not present

## 2016-03-05 DIAGNOSIS — E1129 Type 2 diabetes mellitus with other diabetic kidney complication: Secondary | ICD-10-CM | POA: Diagnosis not present

## 2016-03-05 DIAGNOSIS — Z992 Dependence on renal dialysis: Secondary | ICD-10-CM | POA: Diagnosis not present

## 2016-03-05 DIAGNOSIS — N186 End stage renal disease: Secondary | ICD-10-CM | POA: Diagnosis not present

## 2016-03-06 DIAGNOSIS — D631 Anemia in chronic kidney disease: Secondary | ICD-10-CM | POA: Diagnosis not present

## 2016-03-06 DIAGNOSIS — N186 End stage renal disease: Secondary | ICD-10-CM | POA: Diagnosis not present

## 2016-03-09 DIAGNOSIS — D631 Anemia in chronic kidney disease: Secondary | ICD-10-CM | POA: Diagnosis not present

## 2016-03-09 DIAGNOSIS — N186 End stage renal disease: Secondary | ICD-10-CM | POA: Diagnosis not present

## 2016-03-10 DIAGNOSIS — L97512 Non-pressure chronic ulcer of other part of right foot with fat layer exposed: Secondary | ICD-10-CM | POA: Diagnosis not present

## 2016-03-10 DIAGNOSIS — T8189XD Other complications of procedures, not elsewhere classified, subsequent encounter: Secondary | ICD-10-CM | POA: Diagnosis not present

## 2016-03-10 DIAGNOSIS — L97522 Non-pressure chronic ulcer of other part of left foot with fat layer exposed: Secondary | ICD-10-CM | POA: Diagnosis not present

## 2016-03-10 DIAGNOSIS — I70209 Unspecified atherosclerosis of native arteries of extremities, unspecified extremity: Secondary | ICD-10-CM | POA: Diagnosis not present

## 2016-03-13 DIAGNOSIS — N186 End stage renal disease: Secondary | ICD-10-CM | POA: Diagnosis not present

## 2016-03-13 DIAGNOSIS — D631 Anemia in chronic kidney disease: Secondary | ICD-10-CM | POA: Diagnosis not present

## 2016-03-16 DIAGNOSIS — D631 Anemia in chronic kidney disease: Secondary | ICD-10-CM | POA: Diagnosis not present

## 2016-03-16 DIAGNOSIS — N186 End stage renal disease: Secondary | ICD-10-CM | POA: Diagnosis not present

## 2016-03-17 ENCOUNTER — Inpatient Hospital Stay (HOSPITAL_COMMUNITY): Payer: Medicare Other

## 2016-03-17 ENCOUNTER — Inpatient Hospital Stay (HOSPITAL_COMMUNITY)
Admission: EM | Admit: 2016-03-17 | Discharge: 2016-03-20 | DRG: 299 | Disposition: A | Discharge status: Skilled Nursing Facility | Payer: Medicare Other | Attending: Internal Medicine | Admitting: Internal Medicine

## 2016-03-17 ENCOUNTER — Encounter (HOSPITAL_COMMUNITY): Payer: Self-pay | Admitting: Emergency Medicine

## 2016-03-17 ENCOUNTER — Emergency Department (HOSPITAL_COMMUNITY): Payer: Medicare Other

## 2016-03-17 DIAGNOSIS — L03032 Cellulitis of left toe: Secondary | ICD-10-CM | POA: Diagnosis present

## 2016-03-17 DIAGNOSIS — E1140 Type 2 diabetes mellitus with diabetic neuropathy, unspecified: Secondary | ICD-10-CM | POA: Diagnosis present

## 2016-03-17 DIAGNOSIS — E1122 Type 2 diabetes mellitus with diabetic chronic kidney disease: Secondary | ICD-10-CM | POA: Diagnosis present

## 2016-03-17 DIAGNOSIS — B999 Unspecified infectious disease: Secondary | ICD-10-CM

## 2016-03-17 DIAGNOSIS — E1152 Type 2 diabetes mellitus with diabetic peripheral angiopathy with gangrene: Principal | ICD-10-CM | POA: Diagnosis present

## 2016-03-17 DIAGNOSIS — E876 Hypokalemia: Secondary | ICD-10-CM | POA: Diagnosis present

## 2016-03-17 DIAGNOSIS — I132 Hypertensive heart and chronic kidney disease with heart failure and with stage 5 chronic kidney disease, or end stage renal disease: Secondary | ICD-10-CM | POA: Diagnosis present

## 2016-03-17 DIAGNOSIS — L03115 Cellulitis of right lower limb: Secondary | ICD-10-CM | POA: Diagnosis present

## 2016-03-17 DIAGNOSIS — M79672 Pain in left foot: Secondary | ICD-10-CM | POA: Diagnosis not present

## 2016-03-17 DIAGNOSIS — E11628 Type 2 diabetes mellitus with other skin complications: Secondary | ICD-10-CM | POA: Diagnosis present

## 2016-03-17 DIAGNOSIS — I96 Gangrene, not elsewhere classified: Secondary | ICD-10-CM | POA: Diagnosis not present

## 2016-03-17 DIAGNOSIS — N2581 Secondary hyperparathyroidism of renal origin: Secondary | ICD-10-CM | POA: Diagnosis present

## 2016-03-17 DIAGNOSIS — T8189XD Other complications of procedures, not elsewhere classified, subsequent encounter: Secondary | ICD-10-CM | POA: Diagnosis not present

## 2016-03-17 DIAGNOSIS — I5032 Chronic diastolic (congestive) heart failure: Secondary | ICD-10-CM | POA: Diagnosis present

## 2016-03-17 DIAGNOSIS — E118 Type 2 diabetes mellitus with unspecified complications: Secondary | ICD-10-CM | POA: Diagnosis present

## 2016-03-17 DIAGNOSIS — M6281 Muscle weakness (generalized): Secondary | ICD-10-CM | POA: Diagnosis not present

## 2016-03-17 DIAGNOSIS — L03116 Cellulitis of left lower limb: Secondary | ICD-10-CM | POA: Diagnosis present

## 2016-03-17 DIAGNOSIS — E8889 Other specified metabolic disorders: Secondary | ICD-10-CM | POA: Diagnosis present

## 2016-03-17 DIAGNOSIS — Z22322 Carrier or suspected carrier of Methicillin resistant Staphylococcus aureus: Secondary | ICD-10-CM | POA: Diagnosis not present

## 2016-03-17 DIAGNOSIS — Z823 Family history of stroke: Secondary | ICD-10-CM

## 2016-03-17 DIAGNOSIS — L03119 Cellulitis of unspecified part of limb: Secondary | ICD-10-CM | POA: Diagnosis present

## 2016-03-17 DIAGNOSIS — R2689 Other abnormalities of gait and mobility: Secondary | ICD-10-CM | POA: Diagnosis not present

## 2016-03-17 DIAGNOSIS — L0390 Cellulitis, unspecified: Secondary | ICD-10-CM | POA: Diagnosis not present

## 2016-03-17 DIAGNOSIS — Z7901 Long term (current) use of anticoagulants: Secondary | ICD-10-CM

## 2016-03-17 DIAGNOSIS — N400 Enlarged prostate without lower urinary tract symptoms: Secondary | ICD-10-CM | POA: Diagnosis not present

## 2016-03-17 DIAGNOSIS — K219 Gastro-esophageal reflux disease without esophagitis: Secondary | ICD-10-CM | POA: Diagnosis present

## 2016-03-17 DIAGNOSIS — M7989 Other specified soft tissue disorders: Secondary | ICD-10-CM | POA: Diagnosis not present

## 2016-03-17 DIAGNOSIS — F329 Major depressive disorder, single episode, unspecified: Secondary | ICD-10-CM | POA: Diagnosis present

## 2016-03-17 DIAGNOSIS — R41841 Cognitive communication deficit: Secondary | ICD-10-CM | POA: Diagnosis not present

## 2016-03-17 DIAGNOSIS — I482 Chronic atrial fibrillation, unspecified: Secondary | ICD-10-CM | POA: Diagnosis present

## 2016-03-17 DIAGNOSIS — D649 Anemia, unspecified: Secondary | ICD-10-CM | POA: Diagnosis present

## 2016-03-17 DIAGNOSIS — Z992 Dependence on renal dialysis: Secondary | ICD-10-CM | POA: Diagnosis not present

## 2016-03-17 DIAGNOSIS — N186 End stage renal disease: Secondary | ICD-10-CM | POA: Diagnosis present

## 2016-03-17 DIAGNOSIS — L97512 Non-pressure chronic ulcer of other part of right foot with fat layer exposed: Secondary | ICD-10-CM | POA: Diagnosis not present

## 2016-03-17 DIAGNOSIS — R1310 Dysphagia, unspecified: Secondary | ICD-10-CM | POA: Diagnosis not present

## 2016-03-17 DIAGNOSIS — M79674 Pain in right toe(s): Secondary | ICD-10-CM | POA: Diagnosis not present

## 2016-03-17 DIAGNOSIS — M109 Gout, unspecified: Secondary | ICD-10-CM | POA: Diagnosis present

## 2016-03-17 DIAGNOSIS — I70209 Unspecified atherosclerosis of native arteries of extremities, unspecified extremity: Secondary | ICD-10-CM | POA: Diagnosis not present

## 2016-03-17 DIAGNOSIS — E785 Hyperlipidemia, unspecified: Secondary | ICD-10-CM | POA: Diagnosis present

## 2016-03-17 DIAGNOSIS — R279 Unspecified lack of coordination: Secondary | ICD-10-CM | POA: Diagnosis not present

## 2016-03-17 DIAGNOSIS — I739 Peripheral vascular disease, unspecified: Secondary | ICD-10-CM | POA: Diagnosis not present

## 2016-03-17 DIAGNOSIS — I120 Hypertensive chronic kidney disease with stage 5 chronic kidney disease or end stage renal disease: Secondary | ICD-10-CM | POA: Diagnosis not present

## 2016-03-17 DIAGNOSIS — L97522 Non-pressure chronic ulcer of other part of left foot with fat layer exposed: Secondary | ICD-10-CM | POA: Diagnosis not present

## 2016-03-17 DIAGNOSIS — R1312 Dysphagia, oropharyngeal phase: Secondary | ICD-10-CM | POA: Diagnosis not present

## 2016-03-17 DIAGNOSIS — Z7952 Long term (current) use of systemic steroids: Secondary | ICD-10-CM

## 2016-03-17 DIAGNOSIS — Z940 Kidney transplant status: Secondary | ICD-10-CM

## 2016-03-17 DIAGNOSIS — S91302A Unspecified open wound, left foot, initial encounter: Secondary | ICD-10-CM | POA: Diagnosis not present

## 2016-03-17 DIAGNOSIS — Z89412 Acquired absence of left great toe: Secondary | ICD-10-CM | POA: Diagnosis not present

## 2016-03-17 DIAGNOSIS — E1129 Type 2 diabetes mellitus with other diabetic kidney complication: Secondary | ICD-10-CM | POA: Diagnosis not present

## 2016-03-17 DIAGNOSIS — L8990 Pressure ulcer of unspecified site, unspecified stage: Secondary | ICD-10-CM | POA: Diagnosis present

## 2016-03-17 DIAGNOSIS — Z794 Long term (current) use of insulin: Secondary | ICD-10-CM

## 2016-03-17 DIAGNOSIS — E039 Hypothyroidism, unspecified: Secondary | ICD-10-CM | POA: Diagnosis present

## 2016-03-17 DIAGNOSIS — D631 Anemia in chronic kidney disease: Secondary | ICD-10-CM | POA: Diagnosis not present

## 2016-03-17 DIAGNOSIS — R031 Nonspecific low blood-pressure reading: Secondary | ICD-10-CM | POA: Diagnosis not present

## 2016-03-17 DIAGNOSIS — L989 Disorder of the skin and subcutaneous tissue, unspecified: Secondary | ICD-10-CM | POA: Diagnosis not present

## 2016-03-17 DIAGNOSIS — S91301A Unspecified open wound, right foot, initial encounter: Secondary | ICD-10-CM | POA: Diagnosis not present

## 2016-03-17 HISTORY — DX: Acute pancreatitis without necrosis or infection, unspecified: K85.90

## 2016-03-17 HISTORY — DX: Carrier or suspected carrier of methicillin resistant Staphylococcus aureus: Z22.322

## 2016-03-17 LAB — CBC WITH DIFFERENTIAL/PLATELET
Basophils Absolute: 0 10*3/uL (ref 0.0–0.1)
Basophils Relative: 0 %
Eosinophils Absolute: 0 10*3/uL (ref 0.0–0.7)
Eosinophils Relative: 0 %
HCT: 38 % — ABNORMAL LOW (ref 39.0–52.0)
Hemoglobin: 12.4 g/dL — ABNORMAL LOW (ref 13.0–17.0)
Lymphocytes Relative: 24 %
Lymphs Abs: 1.4 10*3/uL (ref 0.7–4.0)
MCH: 29.8 pg (ref 26.0–34.0)
MCHC: 32.6 g/dL (ref 30.0–36.0)
MCV: 91.3 fL (ref 78.0–100.0)
Monocytes Absolute: 0.6 10*3/uL (ref 0.1–1.0)
Monocytes Relative: 11 %
Neutro Abs: 3.9 10*3/uL (ref 1.7–7.7)
Neutrophils Relative %: 65 %
Platelets: 193 10*3/uL (ref 150–400)
RBC: 4.16 MIL/uL — ABNORMAL LOW (ref 4.22–5.81)
RDW: 16.8 % — ABNORMAL HIGH (ref 11.5–15.5)
WBC: 6 10*3/uL (ref 4.0–10.5)

## 2016-03-17 LAB — BASIC METABOLIC PANEL
Anion gap: 12 (ref 5–15)
BUN: 20 mg/dL (ref 6–20)
CO2: 23 mmol/L (ref 22–32)
Calcium: 8.6 mg/dL — ABNORMAL LOW (ref 8.9–10.3)
Chloride: 99 mmol/L — ABNORMAL LOW (ref 101–111)
Creatinine, Ser: 2.33 mg/dL — ABNORMAL HIGH (ref 0.61–1.24)
GFR calc Af Amer: 30 mL/min — ABNORMAL LOW (ref >60)
GFR calc non Af Amer: 26 mL/min — ABNORMAL LOW (ref >60)
Glucose, Bld: 261 mg/dL — ABNORMAL HIGH (ref 65–99)
Potassium: 3.3 mmol/L — ABNORMAL LOW (ref 3.5–5.1)
Sodium: 134 mmol/L — ABNORMAL LOW (ref 135–145)

## 2016-03-17 LAB — GLUCOSE, CAPILLARY
Glucose-Capillary: 338 mg/dL — ABNORMAL HIGH (ref 65–99)
Glucose-Capillary: 274 mg/dL — ABNORMAL HIGH (ref 65–99)

## 2016-03-17 LAB — PROTIME-INR
INR: 1.92
Prothrombin Time: 22.3 seconds — ABNORMAL HIGH (ref 11.4–15.2)

## 2016-03-17 MED ORDER — PIPERACILLIN-TAZOBACTAM 3.375 G IVPB
3.3750 g | Freq: Three times a day (TID) | INTRAVENOUS | Status: DC
  Administered 2016-03-18 (×2): 3.375 g via INTRAVENOUS
  Filled 2016-03-17 (×3): qty 50

## 2016-03-17 MED ORDER — LEVALBUTEROL HCL 1.25 MG/0.5ML IN NEBU: 1.2500 mg | INHALATION_SOLUTION | Freq: Four times a day (QID) | RESPIRATORY_TRACT | Status: DC | PRN

## 2016-03-17 MED ORDER — ALLOPURINOL 300 MG PO TABS
150.0000 mg | ORAL_TABLET | Freq: Every day | ORAL | Status: DC
  Administered 2016-03-17 – 2016-03-20 (×4): 150 mg via ORAL
  Filled 2016-03-17 (×4): qty 1

## 2016-03-17 MED ORDER — TACROLIMUS 0.5 MG PO CAPS
0.5000 mg | ORAL_CAPSULE | Freq: Two times a day (BID) | ORAL | Status: DC
  Administered 2016-03-17 – 2016-03-20 (×6): 0.5 mg via ORAL
  Filled 2016-03-17 (×7): qty 1

## 2016-03-17 MED ORDER — ONDANSETRON HCL 4 MG PO TABS: 4.0000 mg | ORAL_TABLET | Freq: Four times a day (QID) | ORAL | Status: DC | PRN

## 2016-03-17 MED ORDER — SODIUM CHLORIDE 0.9% FLUSH
3.0000 mL | Freq: Two times a day (BID) | INTRAVENOUS | Status: DC
  Administered 2016-03-17: 3 mL via INTRAVENOUS

## 2016-03-17 MED ORDER — ROSUVASTATIN CALCIUM 10 MG PO TABS
5.0000 mg | ORAL_TABLET | Freq: Every evening | ORAL | Status: DC
Start: 2016-03-17 — End: 2016-03-20
  Administered 2016-03-17 – 2016-03-20 (×4): 5 mg via ORAL
  Filled 2016-03-17 (×4): qty 1

## 2016-03-17 MED ORDER — INSULIN ASPART 100 UNIT/ML ~~LOC~~ SOLN
0.0000 [IU] | Freq: Three times a day (TID) | SUBCUTANEOUS | Status: DC
  Administered 2016-03-17: 7 [IU] via SUBCUTANEOUS
  Administered 2016-03-18: 3 [IU] via SUBCUTANEOUS

## 2016-03-17 MED ORDER — ACETAMINOPHEN 325 MG PO TABS
650.0000 mg | ORAL_TABLET | Freq: Four times a day (QID) | ORAL | Status: DC | PRN
  Administered 2016-03-18 – 2016-03-19 (×3): 650 mg via ORAL
  Filled 2016-03-17 (×3): qty 2

## 2016-03-17 MED ORDER — OLANZAPINE 5 MG PO TABS
5.0000 mg | ORAL_TABLET | Freq: Every day | ORAL | Status: DC
  Administered 2016-03-17 – 2016-03-19 (×3): 5 mg via ORAL
  Filled 2016-03-17 (×3): qty 1

## 2016-03-17 MED ORDER — ONDANSETRON HCL 4 MG/2ML IJ SOLN: 4.0000 mg | Freq: Four times a day (QID) | INTRAMUSCULAR | Status: DC | PRN

## 2016-03-17 MED ORDER — VANCOMYCIN HCL IN DEXTROSE 750-5 MG/150ML-% IV SOLN
750.0000 mg | INTRAVENOUS | Status: DC
  Filled 2016-03-17: qty 150

## 2016-03-17 MED ORDER — METOPROLOL SUCCINATE ER 50 MG PO TB24
50.0000 mg | ORAL_TABLET | Freq: Every day | ORAL | Status: DC
  Administered 2016-03-18 – 2016-03-20 (×3): 50 mg via ORAL
  Filled 2016-03-17 (×3): qty 1

## 2016-03-17 MED ORDER — ACETAMINOPHEN 650 MG RE SUPP: 650.0000 mg | Freq: Four times a day (QID) | RECTAL | Status: DC | PRN

## 2016-03-17 MED ORDER — LEVOTHYROXINE SODIUM 125 MCG PO TABS
125.0000 ug | ORAL_TABLET | Freq: Every day | ORAL | Status: DC
  Administered 2016-03-18 – 2016-03-20 (×3): 125 ug via ORAL
  Filled 2016-03-17 (×3): qty 1

## 2016-03-17 MED ORDER — OXYCODONE HCL 5 MG PO TABS
5.0000 mg | ORAL_TABLET | ORAL | Status: DC | PRN
  Administered 2016-03-18 – 2016-03-20 (×5): 5 mg via ORAL
  Filled 2016-03-17 (×4): qty 1

## 2016-03-17 MED ORDER — INSULIN GLARGINE 100 UNIT/ML ~~LOC~~ SOLN
5.0000 [IU] | Freq: Every day | SUBCUTANEOUS | Status: DC
  Administered 2016-03-17: 5 [IU] via SUBCUTANEOUS
  Filled 2016-03-17 (×2): qty 0.05

## 2016-03-17 MED ORDER — PREDNISONE 5 MG PO TABS
5.0000 mg | ORAL_TABLET | Freq: Every day | ORAL | Status: DC
  Administered 2016-03-18 – 2016-03-20 (×3): 5 mg via ORAL
  Filled 2016-03-17 (×3): qty 1

## 2016-03-17 MED ORDER — SODIUM CHLORIDE 0.9 % IV SOLN: 250.0000 mL | INTRAVENOUS | Status: DC | PRN

## 2016-03-17 MED ORDER — PANTOPRAZOLE SODIUM 40 MG PO TBEC
40.0000 mg | DELAYED_RELEASE_TABLET | Freq: Two times a day (BID) | ORAL | Status: DC
  Administered 2016-03-17 – 2016-03-20 (×6): 40 mg via ORAL
  Filled 2016-03-17 (×2): qty 1
  Filled 2016-03-17: qty 2
  Filled 2016-03-17 (×3): qty 1

## 2016-03-17 MED ORDER — RENA-VITE PO TABS
1.0000 | ORAL_TABLET | Freq: Every day | ORAL | Status: DC
  Administered 2016-03-17 – 2016-03-19 (×3): 1 via ORAL
  Filled 2016-03-17 (×3): qty 1

## 2016-03-17 MED ORDER — INSULIN ASPART 100 UNIT/ML ~~LOC~~ SOLN
0.0000 [IU] | Freq: Every day | SUBCUTANEOUS | Status: DC
  Administered 2016-03-17: 3 [IU] via SUBCUTANEOUS

## 2016-03-17 MED ORDER — TAMSULOSIN HCL 0.4 MG PO CAPS
0.4000 mg | ORAL_CAPSULE | Freq: Every day | ORAL | Status: DC
  Administered 2016-03-17 – 2016-03-20 (×4): 0.4 mg via ORAL
  Filled 2016-03-17 (×4): qty 1

## 2016-03-17 MED ORDER — SODIUM CHLORIDE 0.9% FLUSH: 3.0000 mL | INTRAVENOUS | Status: DC | PRN

## 2016-03-17 MED ORDER — PIPERACILLIN-TAZOBACTAM 3.375 G IVPB 30 MIN
3.3750 g | Freq: Once | INTRAVENOUS | Status: AC
  Administered 2016-03-17: 3.375 g via INTRAVENOUS
  Filled 2016-03-17: qty 50

## 2016-03-17 MED ORDER — VANCOMYCIN HCL 10 G IV SOLR
1750.0000 mg | Freq: Once | INTRAVENOUS | Status: AC
  Administered 2016-03-17: 1750 mg via INTRAVENOUS
  Filled 2016-03-17: qty 1750

## 2016-03-17 NOTE — ED Notes (Signed)
ED Provider at bedside. 

## 2016-03-17 NOTE — Progress Notes (Signed)
Pharmacy Antibiotic Note  BRANDOL BRIXEY is a 75 y.o. 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  Pharmacy has been consulted for vancomycin/zosyn dosing. Afebrile, WBC wnl. SCr 2.33 on admit (1.09 on 02/16/2016), CrCl~27.  Plan: Vancomycin 1750mg  IV x1; then 750mg  IV q24h Zosyn 3.375g IV (4min inf) x1; then 3.375g IV q8h (4h inf) Monitor clinical progress, c/s, renal function, abx plan/LOT VT@SS  as indicated   Height: 5\' 9"  (175.3 cm) Weight: 162 lb (73.5 kg) IBW/kg (Calculated) : 70.7  Temp (24hrs), Avg:97.7 F (36.5 C), Min:97.7 F (36.5 C), Max:97.7 F (36.5 C)   Recent Labs Lab 03/17/16 1207  WBC 6.0  CREATININE 2.33*    Estimated Creatinine Clearance: 27.4 mL/min (by C-G formula based on SCr of 2.33 mg/dL (H)).    Allergies  Allergen Reactions  . Tape Other (See Comments)    SKIN WILL TEAR!!    Elicia Lamp, PharmD, BCPS Clinical Pharmacist 03/17/2016 3:43 PM

## 2016-03-17 NOTE — Consult Note (Signed)
[name] NOTE   MRN : [id number]  Reason for Consult: Left second toe dry gangrene with new erythema, right great toe Referring Physician: ED  History of Present Illness: Mark [name] is a 75 y.o. male who was brought to the ER secondary to increased erythema bilateral LE digits.  He initially presented to the hospital and July 2017 with gangrenous left toe ulcer which developed after bumping it at the beginning of the summer.  It had progressively gotten worse.  He underwent angiography on 11/05/2015 and had successful atherectomy and drug coated balloon angioplasty of a left popliteal stenosis and angioplasty of a left anterior tibial artery that was occluded.  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 He was referred to wound care and was scheduled for f/u back in Oct. 2017.  He was brought in and has not been seen since 12/30/2015.  He resides at a SNF.  Past medical history includes: ESRD s/p kidney transplant failure. On HD via right IJ and has maturing fistula in the left UE (11/19/2015). A fib managed with Coumadin, HNT managed with Metoprolol, DM managed with Insulin.        No current facility-administered medications for this encounter.    Current Outpatient Prescriptions  Medication Sig Dispense Refill  . acetaminophen (TYLENOL) 500 MG tablet Take 500 mg by mouth every 6 (six) hours as needed for mild pain or moderate pain.    Marland Kitchen allopurinol (ZYLOPRIM) 300 MG tablet Take 150 mg by mouth in the morning 15 tablet 5  . feeding supplement (BOOST / RESOURCE BREEZE) LIQD Take 1 Container by mouth 3 (three) times daily with meals.  0  . fluconazole (DIFLUCAN) 150 MG tablet Take 150 mg by mouth daily.    . insulin aspart (NOVOLOG) 100 UNIT/ML injection Inject 0-12 Units into the skin 3 (three) times daily before meals.    . insulin glargine (LANTUS) 100 UNIT/ML injection Inject 0.05 mLs  (5 Units total) into the skin at bedtime. 10 mL 11  . ketoconazole (NIZORAL) 2 % shampoo Apply 1 application topically 2 (two) times a week.     . levalbuterol (XOPENEX) 1.25 MG/0.5ML nebulizer solution Take 1.25 mg by nebulization every 6 (six) hours as needed for wheezing or shortness of breath. 1 each 12  . levothyroxine (SYNTHROID, LEVOTHROID) 125 MCG tablet Take 1 tablet (125 mcg total) by mouth daily before breakfast. 30 tablet 0  . metoprolol succinate (TOPROL-XL) 50 MG 24 hr tablet Take 50 mg by mouth daily.     . multivitamin (RENA-VIT) TABS tablet Take 1 tablet by mouth at bedtime.  0  . nystatin (MYCOSTATIN/NYSTOP) 100000 UNIT/GM POWD Apply 1 g topically daily as needed (irritation).     . OLANZapine (ZYPREXA) 5 MG tablet Take 5 mg by mouth at bedtime.    . pantoprazole (PROTONIX) 40 MG tablet Take 1 tablet (40 mg total) by mouth 2 (two) times daily.    . predniSONE (DELTASONE) 5 MG tablet Take 5 mg by mouth daily.  0  . rosuvastatin (CRESTOR) 5 MG tablet Take 5 mg by mouth every evening.    . tacrolimus (PROGRAF) 0.5 MG capsule Take 0.5 mg by mouth 2 (two) times daily.    . tamsulosin (FLOMAX) 0.4 MG CAPS capsule Take 1 capsule (0.4 mg total) by mouth daily after supper. 30 capsule   . vitamin B-12 (CYANOCOBALAMIN) 100 MCG tablet Take 100 mcg by  mouth daily.    Marland Kitchen warfarin (COUMADIN) 2 MG tablet Take 2 mg by mouth at bedtime.    Marland Kitchen warfarin (COUMADIN) 1 MG tablet Take 1mg  daily. Recheck INR on 11/14 and adjust dose as appropriate. (Patient not taking: Reported on 03/17/2016)      Pt meds include: Statin :Yes Betablocker: Yes ASA: No Other anticoagulants/antiplatelets: Coumadin  Past Medical History:  Diagnosis Date  . Atrial fibrillation ([place])   . CHF (congestive heart failure) ([place])   . Diabetes ([place])   . Diabetic neuropathy ([place])   . Gout   . Hypertension   . Hypothyroidism   . Renal disorder    kidney transplant   . Ulcer of other part of foot 12/15/2013    Past  Surgical History:  Procedure Laterality Date  . AMPUTATION Left 11/06/2015   Procedure: AMPUTATION LEFT GREAT TOE;  Surgeon: Elam Dutch, MD;  Location: [place];  Service: Vascular;  Laterality: Left;  . ANKLE FRACTURE SURGERY    . AV FISTULA PLACEMENT Left 11/19/2015   Procedure: LEFT ARM ARTERIOVENOUS (AV) FISTULA CREATION;  Surgeon: Waynetta Sandy, MD;  Location: [place];  Service: Vascular;  Laterality: Left;  . CATARACT EXTRACTION    . INSERTION OF DIALYSIS CATHETER Right 11/19/2015   Procedure: INSERTION OF DIALYSIS CATHETER RIGHT INTERNAL JUGULAR;  Surgeon: Waynetta Sandy, MD;  Location: [place];  Service: Vascular;  Laterality: Right;  . IR GENERIC HISTORICAL  11/14/2015   IR FLUORO GUIDE CV LINE RIGHT 11/14/2015 Markus Daft, MD [facility]  . IR GENERIC HISTORICAL  11/14/2015   IR US GUIDE VASC ACCESS RIGHT 11/14/2015 Markus Daft, MD [facility]  . MELANOMA EXCISION    . NEPHRECTOMY TRANSPLANTED ORGAN    . PERIPHERAL VASCULAR CATHETERIZATION N/A 11/05/2015   Procedure: Abdominal Aortogram w/Lower Extremity;  Surgeon: Serafina Mitchell, MD;  Location: [place] [facility];  Service: Cardiovascular;  Laterality: N/A;  . PERIPHERAL VASCULAR CATHETERIZATION Left 11/05/2015   Procedure: Peripheral Vascular Atherectomy;  Surgeon: Serafina Mitchell, MD;  Location: [place] [facility];  Service: Cardiovascular;  Laterality: Left;  popiteal  . PERIPHERAL VASCULAR CATHETERIZATION Left 11/05/2015   Procedure: Peripheral Vascular Balloon Angioplasty;  Surgeon: Serafina Mitchell, MD;  Location: [place] [facility];  Service: Cardiovascular;  Laterality: Left;  anterior tib    Social History Social History  Substance Use Topics  . Smoking status: Never Smoker  . Smokeless tobacco: Never Used  . Alcohol use No    Family History Family History  Problem Relation Age of Onset  . Bradycardia Mother   . Stroke Father     Allergies  Allergen Reactions  . Tape Other (See Comments)    SKIN  WILL TEAR!!     REVIEW OF SYSTEMS  General: [ ]  Weight loss, [ ]  Fever, [ ]  chills Neurologic: [ ]  Dizziness, [ ]  Blackouts, [ ]  Seizure [ ]  Stroke, [ ]  "Mini stroke", [ ]  Slurred speech, [ ]  Temporary blindness; [ ]  weakness in arms or legs, [ ]  Hoarseness [ ]  Dysphagia Cardiac: [ ]  Chest pain/pressure, [ ]  Shortness of breath at rest [ ]  Shortness of breath with exertion, [ ]  Atrial fibrillation or irregular heartbeat  Vascular: [ ]  Pain in legs with walking, [ ]  Pain in legs at rest, [ ]  Pain in legs at night,  [ ]  Non-healing ulcer, [ ]  Blood clot in vein/DVT,   Pulmonary: [ ]  Home oxygen, [ ]  Productive cough, [ ]  Coughing  up blood, [ ]  Asthma,  [ ]  Wheezing [ ]  COPD Musculoskeletal:  [ ]  Arthritis, [ ]  Low back pain, [ ]  Joint pain Hematologic: [ ]  Easy Bruising, [ ]  Anemia; [ ]  Hepatitis Gastrointestinal: [ ]  Blood in stool, [ ]  Gastroesophageal Reflux/heartburn, Urinary: [ ]  chronic Kidney disease, [ ]  on HD - [ ]  MWF or [ ]  TTHS, [ ]  Burning with urination, [ ]  Difficulty urinating Skin: [ ]  Rashes, [ ]  Wounds Psychological: [ ]  Anxiety, [ ]  Depression  Physical Examination Vitals:   03/17/16 1345 03/17/16 1400 03/17/16 1415 03/17/16 1430  BP: 135/84 123/75 113/70 125/75  Pulse:  77 101 77  Resp:      Temp:      SpO2: 100% 100% 99% 99%  Weight:      Height:       Body mass index is 23.92 kg/m.  General:  WDWN in NAD Gait: non ambulatory HENT: WNL Eyes: Pupils equal Pulmonary: normal non-labored breathing , without Rales, rhonchi,  wheezing Cardiac: irregularly irregular, without  Murmurs, rubs or gallops; No carotid bruits Abdomen: soft, NT, no masses Skin: no rashes, ulcers noted;  Left great toe amputation site, left tip second toe and right great toe dorsum Gangrene with positive cellulitis.   Vascular Exam/Pulses:Palpable DP bilaterally, doppler signals as well bilateral DP, palpable left popliteal, palpable femoral pulses bilaterally.   Musculoskeletal:  positve general muscle wasting or atrophy; no edema  Neurologic: A&O X 3; Appropriate Affect ;   MOTOR FUNCTION: grossly weak Speech is fluent/normal   Significant Diagnostic Studies: CBC Lab Results  Component Value Date   WBC 6.0 03/17/2016   HGB 12.4 (L) 03/17/2016   HCT 38.0 (L) 03/17/2016   MCV 91.3 03/17/2016   PLT 193 03/17/2016    BMET    Component Value Date/Time   NA 134 (L) 03/17/2016 1207   K 3.3 (L) 03/17/2016 1207   CL 99 (L) 03/17/2016 1207   CO2 23 03/17/2016 1207   GLUCOSE 261 (H) 03/17/2016 1207   BUN 20 03/17/2016 1207   CREATININE 2.33 (H) 03/17/2016 1207   CALCIUM 8.6 (L) 03/17/2016 1207   GFRNONAA 26 (L) 03/17/2016 1207   GFRAA 30 (L) 03/17/2016 1207   Estimated Creatinine Clearance: 27.4 mL/min (by C-G formula based on SCr of 2.33 mg/dL (H)).  COAG Lab Results  Component Value Date   INR 2.62 02/17/2016   INR 2.10 02/16/2016   INR 1.79 02/15/2016     Non-Invasive Vascular Imaging:  DG left foot IMPRESSION: Status post first toe amputation with resection of distal and proximal phalanges. There is some bony irregularity involving the distal aspect of the first metatarsal and component of osteomyelitis cannot be excluded.  RIGHT GREAT TOE  COMPARISON:  None.  FINDINGS: Degenerative changes involving the DIP and PIP joints of the great toe but no destructive bony changes to suggest osteomyelitis. Extensive small vessel calcifications are noted.  IMPRESSION: No definite plain film findings for osteomyelitis.  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  Right great toe dorsum superficial wound.  Bilateral erythema/cellulitis.   Left foot:"There is some bony irregularity involving the distal aspect of the first metatarsal and component of osteomyelitis cannot be excluded."   He is being started on IV antibiotics in the ED.Zosyn to be followed by Vancomycin. INR lab is not back  yet WBC 6.0  The patient states "He does not want to be cut on he states."  Laurence Slate [facility] 03/17/2016 3:33 PM    Vascular lab studies reviewed.  The patient states that his toes look much better today after IV antibiotics.  MRI was negative for ostial right foot.  MRI was negative for ostial of the left foot.  The patient states that his renal function is improving.  He tells me there is a possibility he may be able to come off of dialysis.  For that reason I would not recommend any angiographic evaluation.  I will order a duplex of the left leg to evaluate the previous intervention.  Continue IV antibiotics.  No surgical intervention recommended at this time.  Annamarie Major

## 2016-03-17 NOTE — ED Notes (Signed)
Patient transported to X-ray 

## 2016-03-17 NOTE — H&P (Signed)
History and Physical    Mark [name] [id number] DOB: 24-Jul-1940 DOA: 03/17/2016  PCP: Rochel Brome, MD Patient coming from: [facility]  Chief Complaint: foot pain  HPI: Mark [name] is a 75 y.o. male with medical history significant of chronic foot wounds, CHF, diabetes, diabetic neuropathy, HTN, gout, hypothyroidism, C KD/ESRD on dialysis, atrial fibrillation. Patient is seen once weekly for foot wound evaluation. On day of presentation patient had dressings changed and was noted to have significant enlargement and new areas of erythema, induration and black eschar. Patient reports mild pain in his feet bilaterally but his neuropathy prevents him from feeling much of anything in his feet. Denies any systemic symptoms such as chest pain, shortness of breath, weakness, nausea, vomiting, myalgias, fevers, abdominal pain. Problem is constant and getting worse. No recent antibiotic usage.    ED Course: Started on Zosyn. Surgical consult obtained.  Review of Systems: As per HPI otherwise 10 point review of systems negative.   Ambulatory Status: impeded by foot wounds  Past Medical History:  Diagnosis Date  . Atrial fibrillation ([place])   . CHF (congestive heart failure) ([place])   . Diabetes ([place])   . Diabetic neuropathy ([place])   . Gout   . Hypertension   . Hypothyroidism   . Renal disorder    kidney transplant   . Ulcer of other part of foot 12/15/2013    Past Surgical History:  Procedure Laterality Date  . AMPUTATION Left 11/06/2015   Procedure: AMPUTATION LEFT GREAT TOE;  Surgeon: Elam Dutch, MD;  Location: [place];  Service: Vascular;  Laterality: Left;  . ANKLE FRACTURE SURGERY    . AV FISTULA PLACEMENT Left 11/19/2015   Procedure: LEFT ARM ARTERIOVENOUS (AV) FISTULA CREATION;  Surgeon: Waynetta Sandy, MD;  Location: [place];  Service: Vascular;  Laterality: Left;  . CATARACT EXTRACTION    . INSERTION OF DIALYSIS CATHETER Right 11/19/2015   Procedure: INSERTION OF  DIALYSIS CATHETER RIGHT INTERNAL JUGULAR;  Surgeon: Waynetta Sandy, MD;  Location: [place];  Service: Vascular;  Laterality: Right;  . IR GENERIC HISTORICAL  11/14/2015   IR FLUORO GUIDE CV LINE RIGHT 11/14/2015 Markus Daft, MD [facility]  . IR GENERIC HISTORICAL  11/14/2015   IR US GUIDE VASC ACCESS RIGHT 11/14/2015 Markus Daft, MD [facility]  . KIDNEY TRANSPLANT  2012  . MELANOMA EXCISION    . NEPHRECTOMY TRANSPLANTED ORGAN    . PERIPHERAL VASCULAR CATHETERIZATION N/A 11/05/2015   Procedure: Abdominal Aortogram w/Lower Extremity;  Surgeon: Serafina Mitchell, MD;  Location: [place] [facility];  Service: Cardiovascular;  Laterality: N/A;  . PERIPHERAL VASCULAR CATHETERIZATION Left 11/05/2015   Procedure: Peripheral Vascular Atherectomy;  Surgeon: Serafina Mitchell, MD;  Location: [place] [facility];  Service: Cardiovascular;  Laterality: Left;  popiteal  . PERIPHERAL VASCULAR CATHETERIZATION Left 11/05/2015   Procedure: Peripheral Vascular Balloon Angioplasty;  Surgeon: Serafina Mitchell, MD;  Location: [place] [facility];  Service: Cardiovascular;  Laterality: Left;  anterior tib    Social History   Social History  . Marital status: Widowed    Spouse name: N/A  . Number of children: N/A  . Years of education: N/A   Occupational History  . Not on file.   Social History Main Topics  . Smoking status: Never Smoker  . Smokeless tobacco: Never Used  . Alcohol use No  . Drug use: No  . Sexual activity: Not on file   Other Topics Concern  . Not on  file   Social History Narrative  . No narrative on file    Allergies  Allergen Reactions  . Tape Other (See Comments)    SKIN WILL TEAR!!    Family History  Problem Relation Age of Onset  . Bradycardia Mother   . Stroke Father     Prior to Admission medications   Medication Sig Start Date End Date Taking? Authorizing Provider  acetaminophen (TYLENOL) 500 MG tablet Take 500 mg by mouth every 6 (six) hours as needed for mild pain or  moderate pain.   Yes Historical Provider, MD  allopurinol (ZYLOPRIM) 300 MG tablet Take 150 mg by mouth in the morning 11/26/15  Yes Silver Huguenin Elgergawy, MD  feeding supplement (BOOST / RESOURCE BREEZE) LIQD Take 1 Container by mouth 3 (three) times daily with meals. 11/26/15  Yes Silver Huguenin Elgergawy, MD  fluconazole (DIFLUCAN) 150 MG tablet Take 150 mg by mouth daily.   Yes Historical Provider, MD  insulin aspart (NOVOLOG) 100 UNIT/ML injection Inject 0-12 Units into the skin 3 (three) times daily before meals.   Yes Historical Provider, MD  insulin glargine (LANTUS) 100 UNIT/ML injection Inject 0.05 mLs (5 Units total) into the skin at bedtime. 02/17/16  Yes Sela Hua, MD  ketoconazole (NIZORAL) 2 % shampoo Apply 1 application topically 2 (two) times a week.  12/14/13  Yes Historical Provider, MD  levalbuterol Penne Lash) 1.25 MG/0.5ML nebulizer solution Take 1.25 mg by nebulization every 6 (six) hours as needed for wheezing or shortness of breath. 11/26/15  Yes Albertine Patricia, MD  levothyroxine (SYNTHROID, LEVOTHROID) 125 MCG tablet Take 1 tablet (125 mcg total) by mouth daily before breakfast. 02/18/16  Yes Sela Hua, MD  metoprolol succinate (TOPROL-XL) 50 MG 24 hr tablet Take 50 mg by mouth daily.  06/18/12  Yes Historical Provider, MD  multivitamin (RENA-VIT) TABS tablet Take 1 tablet by mouth at bedtime. 11/26/15  Yes Silver Huguenin Elgergawy, MD  nystatin (MYCOSTATIN/NYSTOP) 100000 UNIT/GM POWD Apply 1 g topically daily as needed (irritation).  12/14/12  Yes Historical Provider, MD  OLANZapine (ZYPREXA) 5 MG tablet Take 5 mg by mouth at bedtime.   Yes Historical Provider, MD  pantoprazole (PROTONIX) 40 MG tablet Take 1 tablet (40 mg total) by mouth 2 (two) times daily. 11/26/15  Yes Silver Huguenin Elgergawy, MD  predniSONE (DELTASONE) 5 MG tablet Take 5 mg by mouth daily. 03/15/14  Yes Historical Provider, MD  rosuvastatin (CRESTOR) 5 MG tablet Take 5 mg by mouth every evening.   Yes Historical  Provider, MD  tacrolimus (PROGRAF) 0.5 MG capsule Take 0.5 mg by mouth 2 (two) times daily.   Yes Historical Provider, MD  tamsulosin (FLOMAX) 0.4 MG CAPS capsule Take 1 capsule (0.4 mg total) by mouth daily after supper. 11/26/15  Yes Albertine Patricia, MD  vitamin B-12 (CYANOCOBALAMIN) 100 MCG tablet Take 100 mcg by mouth daily.   Yes Historical Provider, MD  warfarin (COUMADIN) 2 MG tablet Take 2 mg by mouth at bedtime.   Yes Historical Provider, MD  warfarin (COUMADIN) 1 MG tablet Take 1mg  daily. Recheck INR on 11/14 and adjust dose as appropriate. Patient not taking: Reported on 03/17/2016 02/17/16   Sela Hua, MD    Physical Exam: Vitals:   03/17/16 1530 03/17/16 1545 03/17/16 1600 03/17/16 1733  BP: 137/82 131/90 149/79 136/80  Pulse: 64 (!) 58  (!) 108  Resp:    18  Temp:    98 F (36.7 C)  TempSrc:  Oral  SpO2: 100% 100% 100% 100%  Weight:      Height:         General:  Appears calm and comfortable Eyes:  PERRL, EOMI, normal lids, iris ENT:  grossly normal hearing, lips & tongue, mmm Neck:  no LAD, masses or thyromegaly Cardiovascular:  RRR, IV/VI systolic murmur, L AV fistula patent.  Respiratory:  CTA bilaterally, no w/r/r. Normal respiratory effort. Abdomen:  soft, ntnd, NABS Skin:  Right foot with great toe ulcerated lesion on the dorsum and left foot with dry gangrene of the tip of the second great toe with surrounding erythema and induration including involvement of the stump of previous great toe amputation. Musculoskeletal:  grossly normal tone BUE/BLE, good ROM, no bony abnormality Psychiatric:  grossly normal mood and affect, speech fluent and appropriate, AOx3 Neurologic:  CN 2-12 grossly intact, moves all extremities in coordinated fashion, sensation intact  Labs on Admission: I have personally reviewed following labs and imaging studies  CBC:  Recent Labs Lab 03/17/16 1207  WBC 6.0  NEUTROABS 3.9  HGB 12.4*  HCT 38.0*  MCV 91.3  PLT 0000000    Basic Metabolic Panel:  Recent Labs Lab 03/17/16 1207  NA 134*  K 3.3*  CL 99*  CO2 23  GLUCOSE 261*  BUN 20  CREATININE 2.33*  CALCIUM 8.6*   GFR: Estimated Creatinine Clearance: 27.4 mL/min (by C-G formula based on SCr of 2.33 mg/dL (H)). Liver Function Tests: No results for input(s): AST, ALT, ALKPHOS, BILITOT, PROT, ALBUMIN in the last 168 hours. No results for input(s): LIPASE, AMYLASE in the last 168 hours. No results for input(s): AMMONIA in the last 168 hours. Coagulation Profile: No results for input(s): INR, PROTIME in the last 168 hours. Cardiac Enzymes: No results for input(s): CKTOTAL, CKMB, CKMBINDEX, TROPONINI in the last 168 hours. BNP (last 3 results) No results for input(s): PROBNP in the last 8760 hours. HbA1C: No results for input(s): HGBA1C in the last 72 hours. CBG:  Recent Labs Lab 03/17/16 1742  GLUCAP 338*   Lipid Profile: No results for input(s): CHOL, HDL, LDLCALC, TRIG, CHOLHDL, LDLDIRECT in the last 72 hours. Thyroid Function Tests: No results for input(s): TSH, T4TOTAL, FREET4, T3FREE, THYROIDAB in the last 72 hours. Anemia Panel: No results for input(s): VITAMINB12, FOLATE, FERRITIN, TIBC, IRON, RETICCTPCT in the last 72 hours. Urine analysis:    Component Value Date/Time   COLORURINE ORANGE (A) 02/12/2016 2243   APPEARANCEUR CLOUDY (A) 02/12/2016 2243   LABSPEC 1.017 02/12/2016 2243   PHURINE 5.5 02/12/2016 2243   GLUCOSEU NEGATIVE 02/12/2016 2243   GLUCOSEU NEGATIVE 10/18/2012 0857   HGBUR MODERATE (A) 02/12/2016 2243   BILIRUBINUR SMALL (A) 02/12/2016 2243   KETONESUR NEGATIVE 02/12/2016 2243   PROTEINUR 100 (A) 02/12/2016 2243   UROBILINOGEN 0.2 10/18/2012 0857   NITRITE NEGATIVE 02/12/2016 2243   LEUKOCYTESUR LARGE (A) 02/12/2016 2243    Creatinine Clearance: Estimated Creatinine Clearance: 27.4 mL/min (by C-G formula based on SCr of 2.33 mg/dL (H)).  Sepsis Labs: @LABRCNTIP (procalcitonin:4,lacticidven:4) )No  results found for this or any previous visit (from the past 240 hour(s)).   Radiological Exams on Admission: Dg Foot Complete Left  Result Date: 03/17/2016 CLINICAL DATA:  Left foot pain and infection. History of first toe amputation. EXAM: LEFT FOOT - COMPLETE 3+ VIEW COMPARISON:  10/28/2015 FINDINGS: Status post first toe amputation with resection of the first distal and proximal phalanges. There is some bony irregularity involving the distal aspect of the first metatarsal. Component  of osteomyelitis at this level cannot be excluded and correlation suggested with any clinical evidence of soft tissue infection at the level of the base of prior amputation. Moderate degenerative disease remains present throughout the foot as well as extensive diabetic vascular calcifications. No acute fractures identified. IMPRESSION: Status post first toe amputation with resection of distal and proximal phalanges. There is some bony irregularity involving the distal aspect of the first metatarsal and component of osteomyelitis cannot be excluded. Electronically Signed   By: Aletta Edouard M.D.   On: 03/17/2016 13:27   Dg Toe Great Right  Result Date: 03/17/2016 CLINICAL DATA:  Pain and swelling and 8 right great toe. EXAM: RIGHT GREAT TOE COMPARISON:  None. FINDINGS: Degenerative changes involving the DIP and PIP joints of the great toe but no destructive bony changes to suggest osteomyelitis. Extensive small vessel calcifications are noted. IMPRESSION: No definite plain film findings for osteomyelitis. Electronically Signed   By: Marijo Sanes M.D.   On: 03/17/2016 13:21    EKG: ordered   Assessment/Plan Active Problems:   Gout   Diabetes mellitus with complication (HCC)   Cellulitis   Dry gangrene (HCC)   Chronic a-fib (HCC)   ESRD on dialysis ([place])   Chronic diastolic congestive heart failure (HCC)   BPH (benign prostatic hyperplasia)   Bilat foot wounds: areas of cellulitis and gangrene. Likely to  need amputation due to chronicity and poor vascular state. EDP consulted vascular. Previous surgeries by Dr. Oneida Alar.  - med surge - continue Vanc/Zosyn - ABI - surgical management per Vascular though pt seems to be resistant to therapy - Palliative care consult if pt refuses operative mgt as he is unlikely to clear this infection and he is FULL code - MRI foot bilat to fully assess - f/u BCX  DM: - SSI - continue lantus  Afib: - hold coumadin as likely to undergo surgery. Heparin for anticoagulation - Continue metop for rate control  ESRD: s/p transplant. dialysis MF. Renal aware - continue dialysis per renal - continue prograf, renal MV, prednisone - am cortisol  Depression: - continue zyprexa  Hypothyroidism: - continue synthroid  HLD: - continue crestor  Gout: - continue allopurinol  GERD: - continue protonix  BPH: - continue flomax     DVT prophylaxis:  Hep  Code Status: FULL code - PREVIOUSLY DNR. CONFIRMED IN [facility] SISTER Family Communication: sister  Disposition Plan: pending improvement vs amputation of toes/feet  Consults called: vascular  Admission status: inpt    Dechelle Attaway J MD [facility]  If 7PM-7AM, please contact night-coverage [email or web address] Password [facility]  03/17/2016, 6:17 PM

## 2016-03-17 NOTE — ED Triage Notes (Signed)
Pt arrives via [facility] from [facility] for c/o bilateral toe infections. Pt has toes wrapped bilaterally and una boot on right foot. Pt denies fever or pain. Pt a/ox4, resp e/u, nad.

## 2016-03-17 NOTE — Progress Notes (Signed)
Mark [name] for heparin Indication: atrial fibrillation  Allergies  Allergen Reactions  . Tape Other (See Comments)    SKIN WILL TEAR!!    Patient Measurements: Height: 5\' 9"  (175.3 cm) Weight: 162 lb (73.5 kg) IBW/kg (Calculated) : 70.7   Vital Signs: Temp: 98 F (36.7 C) (12/12 1733) Temp Source: Oral (12/12 1733) BP: 136/80 (12/12 1733) Pulse Rate: 108 (12/12 1733)  Labs:  Recent Labs  03/17/16 1207  HGB 12.4*  HCT 38.0*  PLT 193  CREATININE 2.33*    Estimated Creatinine Clearance: 27.4 mL/min (by C-G formula based on SCr of 2.33 mg/dL (H)).   Medical History: Past Medical History:  Diagnosis Date  . Atrial fibrillation ([place])   . CHF (congestive heart failure) ([place])   . Diabetes ([place])   . Diabetic neuropathy ([place])   . Gout   . Hypertension   . Hypothyroidism   . Renal disorder    kidney transplant   . Ulcer of other part of foot 12/15/2013     Assessment: 75 yo male admitted with chronic foot wounds, presented with increased erythema, induration and enlargement of black eschar. Pt is also ESRD on HD amongst other comorbidities. He is on warfarin prior to admission for AFib. Awaiting baseline INR prior to initiating heparin. Warfarin is being held due to pt likely needing surgery.   Goal of Therapy:  Heparin level 0.3-0.7 units/ml Monitor platelets by anticoagulation protocol: Yes    Plan:  -Pt refusing INR, last INR was therapeutic -Hold heparin until INR <2 -Will recheck INR in am, likely start heparin gtt then    Harvel Quale 03/17/2016,6:27 PM

## 2016-03-17 NOTE — ED Provider Notes (Signed)
[facility] DEPT Provider Note   CSN: [id number] Arrival date & time: 03/17/16  1056   History   Chief Complaint Chief Complaint  Patient presents with  . Wound Infection    HPI Mark [name] is a 75 y.o. male.  HPI   75 year old male presents today with complaints of bleeding infection. Patient reports he is a resident of nursing home. He regularly sees the wound care doctor. He notes the physician came out today evaluated his left second digit and right first digit and instructed him to come to the emergency room.  Patient notes yesterday he did not have any significant redness surrounding infection, notes today redness to the left second toe, and first metatarsal. Patient denies any other signs of infectious etiology.   Patient had resection of the left first great toe on 11/06/2015 performed by Dr. Oneida Alar   Past Medical History:  Diagnosis Date  . Atrial fibrillation ([place])   . CHF (congestive heart failure) ([place])   . Diabetes ([place])   . Diabetic neuropathy ([place])   . Gout   . Hypertension   . Hypothyroidism   . Renal disorder    kidney transplant   . Ulcer of other part of foot 12/15/2013    Patient Active Problem List   Diagnosis Date Noted  . Cellulitis 03/17/2016  . Dry gangrene ([place]) 03/17/2016  . Chronic a-fib ([place]) 03/17/2016  . ESRD on dialysis ([place]) 03/17/2016  . Chronic diastolic congestive heart failure ([place]) 03/17/2016  . BPH (benign prostatic hyperplasia) 03/17/2016  . Cellulitis of lower extremity   . Weight decrease   . Palliative care encounter   . Protein-calorie malnutrition, severe 02/14/2016  . Generalized abdominal pain   . General weakness   . Atrial fibrillation with RVR ([place]) 02/12/2016  . Acute pancreatitis 02/12/2016  . ESRD (end stage renal disease) ([place])   . Melena   . CHF (congestive heart failure) ([place])   . Bibasilar crackles   . Aspiration of liquid   . SOB (shortness of breath)   . Permanent atrial fibrillation ([place])    . Renal transplant recipient 10/28/2015  . Diabetes mellitus with complication ([place]) [id number]  . Gangrene of toe ([place]) 10/28/2015  . Cellulitis of great toe of left foot 10/28/2015  . Blister of foot with infection 02/22/2015  . Diabetic peripheral neuropathy ([place]) 06/12/2014  . Hammer toe of right foot 02/16/2014  . Onychomycosis 02/02/2014  . Pain in lower limb 02/02/2014  . Ulcer of right foot ([place]) 01/19/2014  . Ulcer of right ankle ([place]) 01/08/2014  . Ulcer of other part of foot 12/15/2013  . Type II diabetes mellitus, uncontrolled ([place]) 10/20/2012  . Other and unspecified hyperlipidemia 10/20/2012  . Chronic kidney disease, stage III (moderate) 10/20/2012  . Unspecified hypothyroidism 10/20/2012  . Edema extremities 10/20/2012  . Gout 10/20/2012    Past Surgical History:  Procedure Laterality Date  . AMPUTATION Left 11/06/2015   Procedure: AMPUTATION LEFT GREAT TOE;  Surgeon: Elam Dutch, MD;  Location: [facility];  Service: Vascular;  Laterality: Left;  . ANKLE FRACTURE SURGERY    . AV FISTULA PLACEMENT Left 11/19/2015   Procedure: LEFT ARM ARTERIOVENOUS (AV) FISTULA CREATION;  Surgeon: Waynetta Sandy, MD;  Location: [facility];  Service: Vascular;  Laterality: Left;  . CATARACT EXTRACTION    . INSERTION OF DIALYSIS CATHETER Right 11/19/2015   Procedure: INSERTION OF DIALYSIS CATHETER RIGHT INTERNAL JUGULAR;  Surgeon: Waynetta Sandy, MD;  Location: [facility];  Service: Vascular;  Laterality: Right;  . IR GENERIC HISTORICAL  11/14/2015   IR FLUORO GUIDE CV LINE RIGHT 11/14/2015 Markus Daft, MD [facility]  . IR GENERIC HISTORICAL  11/14/2015   IR US GUIDE VASC ACCESS RIGHT 11/14/2015 Markus Daft, MD [facility]  . KIDNEY TRANSPLANT  2012  . MELANOMA EXCISION    . NEPHRECTOMY TRANSPLANTED ORGAN    . PERIPHERAL VASCULAR CATHETERIZATION N/A 11/05/2015   Procedure: Abdominal Aortogram w/Lower Extremity;  Surgeon: Serafina [place], MD;  Location: [facility];   Service: Cardiovascular;  Laterality: N/A;  . PERIPHERAL VASCULAR CATHETERIZATION Left 11/05/2015   Procedure: Peripheral Vascular Atherectomy;  Surgeon: Serafina [place], MD;  Location: [facility];  Service: Cardiovascular;  Laterality: Left;  popiteal  . PERIPHERAL VASCULAR CATHETERIZATION Left 11/05/2015   Procedure: Peripheral Vascular Balloon Angioplasty;  Surgeon: Serafina [place], MD;  Location: [facility];  Service: Cardiovascular;  Laterality: Left;  anterior tib       Home Medications    Prior to Admission medications   Medication Sig Start Date End Date Taking? Authorizing Provider  acetaminophen (TYLENOL) 500 MG tablet Take 500 mg by mouth every 6 (six) hours as needed for mild pain or moderate pain.   Yes Historical Provider, MD  allopurinol (ZYLOPRIM) 300 MG tablet Take 150 mg by mouth in the morning 11/26/15  Yes Silver Huguenin Elgergawy, MD  feeding supplement (BOOST / RESOURCE BREEZE) LIQD Take 1 Container by mouth 3 (three) times daily with meals. 11/26/15  Yes Silver Huguenin Elgergawy, MD  fluconazole (DIFLUCAN) 150 MG tablet Take 150 mg by mouth daily.   Yes Historical Provider, MD  insulin aspart (NOVOLOG) 100 UNIT/ML injection Inject 0-12 Units into the skin 3 (three) times daily before meals.   Yes Historical Provider, MD  insulin glargine (LANTUS) 100 UNIT/ML injection Inject 0.05 mLs (5 Units total) into the skin at bedtime. 02/17/16  Yes Sela Hua, MD  ketoconazole (NIZORAL) 2 % shampoo Apply 1 application topically 2 (two) times a week.  12/14/13  Yes Historical Provider, MD  levalbuterol Penne Lash) 1.25 MG/0.5ML nebulizer solution Take 1.25 mg by nebulization every 6 (six) hours as needed for wheezing or shortness of breath. 11/26/15  Yes Albertine Patricia, MD  levothyroxine (SYNTHROID, LEVOTHROID) 125 MCG tablet Take 1 tablet (125 mcg total) by mouth daily before breakfast. 02/18/16  Yes Sela Hua, MD  metoprolol succinate (TOPROL-XL) 50 MG 24 hr tablet Take 50  mg by mouth daily.  06/18/12  Yes Historical Provider, MD  multivitamin (RENA-VIT) TABS tablet Take 1 tablet by mouth at bedtime. 11/26/15  Yes Silver Huguenin Elgergawy, MD  nystatin (MYCOSTATIN/NYSTOP) 100000 UNIT/GM POWD Apply 1 g topically daily as needed (irritation).  12/14/12  Yes Historical Provider, MD  OLANZapine (ZYPREXA) 5 MG tablet Take 5 mg by mouth at bedtime.   Yes Historical Provider, MD  pantoprazole (PROTONIX) 40 MG tablet Take 1 tablet (40 mg total) by mouth 2 (two) times daily. 11/26/15  Yes Silver Huguenin Elgergawy, MD  predniSONE (DELTASONE) 5 MG tablet Take 5 mg by mouth daily. 03/15/14  Yes Historical Provider, MD  rosuvastatin (CRESTOR) 5 MG tablet Take 5 mg by mouth every evening.   Yes Historical Provider, MD  tacrolimus (PROGRAF) 0.5 MG capsule Take 0.5 mg by mouth 2 (two) times daily.   Yes Historical Provider, MD  tamsulosin (FLOMAX) 0.4 MG CAPS capsule Take 1 capsule (0.4 mg total) by mouth daily after supper. 11/26/15  Yes Dawood S  Elgergawy, MD  vitamin B-12 (CYANOCOBALAMIN) 100 MCG tablet Take 100 mcg by mouth daily.   Yes Historical Provider, MD  warfarin (COUMADIN) 2 MG tablet Take 2 mg by mouth at bedtime.   Yes Historical Provider, MD  warfarin (COUMADIN) 1 MG tablet Take 1mg  daily. Recheck INR on 11/14 and adjust dose as appropriate. Patient not taking: Reported on 03/17/2016 02/17/16   Sela Hua, MD    Family History Family History  Problem Relation Age of Onset  . Bradycardia Mother   . Stroke Father     Social History Social History  Substance Use Topics  . Smoking status: Never Smoker  . Smokeless tobacco: Never Used  . Alcohol use No     Allergies   Tape   Review of Systems Review of Systems  All other systems reviewed and are negative.    Physical Exam Updated Vital Signs BP 136/80 (BP Location: Right Arm)   Pulse (!) 108   Temp 98 F (36.7 C) (Oral)   Resp 18   Ht 5\' 9"  (1.753 m)   Wt 73.5 kg   SpO2 100%   BMI 23.92 kg/m    Physical Exam  Constitutional: He is oriented to person, place, and time. He appears well-developed and well-nourished.  HENT:  Head: Normocephalic and atraumatic.  Eyes: Conjunctivae are normal. Pupils are equal, round, and reactive to light. Right eye exhibits no discharge. Left eye exhibits no discharge. No scleral icterus.  Neck: Normal range of motion. No JVD present. No tracheal deviation present.  Pulmonary/Chest: Effort normal. No stridor.  Neurological: He is alert and oriented to person, place, and time. Coordination normal.  Psychiatric: He has a normal mood and affect. His behavior is normal. Judgment and thought content normal.  Nursing note and vitals reviewed.         ED Treatments / Results  Labs (all labs ordered are listed, but only abnormal results are displayed) Labs Reviewed  CBC WITH DIFFERENTIAL/PLATELET - Abnormal; Notable for the following:       Result Value   RBC 4.16 (*)    Hemoglobin 12.4 (*)    HCT 38.0 (*)    RDW 16.8 (*)    All other components within normal limits  BASIC METABOLIC PANEL - Abnormal; Notable for the following:    Sodium 134 (*)    Potassium 3.3 (*)    Chloride 99 (*)    Glucose, Bld 261 (*)    Creatinine, Ser 2.33 (*)    Calcium 8.6 (*)    GFR calc non Af Amer 26 (*)    GFR calc Af Amer 30 (*)    All other components within normal limits  GLUCOSE, CAPILLARY - Abnormal; Notable for the following:    Glucose-Capillary 338 (*)    All other components within normal limits  CULTURE, BLOOD (ROUTINE X 2)  CULTURE, BLOOD (ROUTINE X 2)  CBC  COMPREHENSIVE METABOLIC PANEL  CORTISOL-AM, BLOOD  PROTIME-INR  PROTIME-INR    EKG  EKG Interpretation None       Radiology Dg Foot Complete Left  Result Date: 03/17/2016 CLINICAL DATA:  Left foot pain and infection. History of first toe amputation. EXAM: LEFT FOOT - COMPLETE 3+ VIEW COMPARISON:  10/28/2015 FINDINGS: Status post first toe amputation with resection of the  first distal and proximal phalanges. There is some bony irregularity involving the distal aspect of the first metatarsal. Component of osteomyelitis at this level cannot be excluded and correlation suggested with any clinical  evidence of soft tissue infection at the level of the base of prior amputation. Moderate degenerative disease remains present throughout the foot as well as extensive diabetic vascular calcifications. No acute fractures identified. IMPRESSION: Status post first toe amputation with resection of distal and proximal phalanges. There is some bony irregularity involving the distal aspect of the first metatarsal and component of osteomyelitis cannot be excluded. Electronically Signed   By: Aletta Edouard M.D.   On: 03/17/2016 13:27   Dg Toe Great Right  Result Date: 03/17/2016 CLINICAL DATA:  Pain and swelling and 8 right great toe. EXAM: RIGHT GREAT TOE COMPARISON:  None. FINDINGS: Degenerative changes involving the DIP and PIP joints of the great toe but no destructive bony changes to suggest osteomyelitis. Extensive small vessel calcifications are noted. IMPRESSION: No definite plain film findings for osteomyelitis. Electronically Signed   By: Marijo Sanes M.D.   On: 03/17/2016 13:21    Procedures Procedures (including critical care time)  Medications Ordered in ED Medications  vancomycin (VANCOCIN) 1,750 mg in sodium chloride 0.9 % 500 mL IVPB (1,750 mg Intravenous New Bag/Given 03/17/16 1711)  piperacillin-tazobactam (ZOSYN) IVPB 3.375 g (not administered)  vancomycin (VANCOCIN) IVPB 750 mg/150 ml premix (not administered)  OLANZapine (ZYPREXA) tablet 5 mg (not administered)  insulin glargine (LANTUS) injection 5 Units (not administered)  levothyroxine (SYNTHROID, LEVOTHROID) tablet 125 mcg (not administered)  rosuvastatin (CRESTOR) tablet 5 mg (5 mg Oral Given 03/17/16 1827)  allopurinol (ZYLOPRIM) tablet 150 mg (150 mg Oral Given 03/17/16 1827)  tamsulosin (FLOMAX)  capsule 0.4 mg (0.4 mg Oral Given 03/17/16 1827)  levalbuterol (XOPENEX) nebulizer solution 1.25 mg (not administered)  multivitamin (RENA-VIT) tablet 1 tablet (not administered)  pantoprazole (PROTONIX) EC tablet 40 mg (not administered)  predniSONE (DELTASONE) tablet 5 mg (not administered)  tacrolimus (PROGRAF) capsule 0.5 mg (not administered)  metoprolol succinate (TOPROL-XL) 24 hr tablet 50 mg (not administered)  sodium chloride flush (NS) 0.9 % injection 3 mL (not administered)  sodium chloride flush (NS) 0.9 % injection 3 mL (not administered)  0.9 %  sodium chloride infusion (not administered)  acetaminophen (TYLENOL) tablet 650 mg (not administered)    Or  acetaminophen (TYLENOL) suppository 650 mg (not administered)  oxyCODONE (Oxy IR/ROXICODONE) immediate release tablet 5 mg (not administered)  ondansetron (ZOFRAN) tablet 4 mg (not administered)    Or  ondansetron (ZOFRAN) injection 4 mg (not administered)  insulin aspart (novoLOG) injection 0-9 Units (7 Units Subcutaneous Given 03/17/16 1829)  insulin aspart (novoLOG) injection 0-5 Units (not administered)  piperacillin-tazobactam (ZOSYN) IVPB 3.375 g (0 g Intravenous Stopped 03/17/16 1711)     Initial Impression / Assessment and Plan / ED Course  I have reviewed the triage vital signs and the nursing notes.  Pertinent labs & imaging results that were available during my care of the patient were reviewed by me and considered in my medical decision making (see chart for details).  Clinical Course     Final Clinical Impressions(s) / ED Diagnoses   Final diagnoses:  Infection  Cellulitis of lower extremity, unspecified laterality  Gangrene ([place])  Gangrene ([place])    Labs:  Imaging:DG foot, DG toe  Consults:  Therapeutics:  Discharge Meds:   Assessment/Plan:  75 year old male presents today with cellulitis with questionable osteomyelitis. Vascular surgery was consult and who recommended hospital admission,  Vancocin Zosyn, and they would evaluate for definitive management. Patient is afebrile nontoxic. Hospital service consult at and agreed to admit.   New Prescriptions Current Discharge Medication List  Okey Regal, PA-C 03/17/16 1842    Gareth Morgan, MD 03/21/16 [phone]

## 2016-03-18 ENCOUNTER — Encounter: Payer: Self-pay | Admitting: Family

## 2016-03-18 ENCOUNTER — Inpatient Hospital Stay (HOSPITAL_COMMUNITY): Payer: Medicare Other

## 2016-03-18 ENCOUNTER — Encounter (HOSPITAL_COMMUNITY): Payer: Self-pay | Admitting: Internal Medicine

## 2016-03-18 DIAGNOSIS — N400 Enlarged prostate without lower urinary tract symptoms: Secondary | ICD-10-CM

## 2016-03-18 DIAGNOSIS — I96 Gangrene, not elsewhere classified: Secondary | ICD-10-CM

## 2016-03-18 DIAGNOSIS — L89609 Pressure ulcer of unspecified heel, unspecified stage: Secondary | ICD-10-CM

## 2016-03-18 DIAGNOSIS — Z22322 Carrier or suspected carrier of Methicillin resistant Staphylococcus aureus: Secondary | ICD-10-CM

## 2016-03-18 DIAGNOSIS — L03119 Cellulitis of unspecified part of limb: Secondary | ICD-10-CM

## 2016-03-18 DIAGNOSIS — Z992 Dependence on renal dialysis: Secondary | ICD-10-CM

## 2016-03-18 DIAGNOSIS — N186 End stage renal disease: Secondary | ICD-10-CM

## 2016-03-18 DIAGNOSIS — I5032 Chronic diastolic (congestive) heart failure: Secondary | ICD-10-CM

## 2016-03-18 DIAGNOSIS — E118 Type 2 diabetes mellitus with unspecified complications: Secondary | ICD-10-CM

## 2016-03-18 DIAGNOSIS — I482 Chronic atrial fibrillation: Secondary | ICD-10-CM

## 2016-03-18 DIAGNOSIS — L8990 Pressure ulcer of unspecified site, unspecified stage: Secondary | ICD-10-CM | POA: Diagnosis present

## 2016-03-18 HISTORY — DX: Carrier or suspected carrier of methicillin resistant Staphylococcus aureus: Z22.322

## 2016-03-18 LAB — CBC
HCT: 34.1 % — ABNORMAL LOW (ref 39.0–52.0)
Hemoglobin: 11.1 g/dL — ABNORMAL LOW (ref 13.0–17.0)
MCH: 29.8 pg (ref 26.0–34.0)
MCHC: 32.6 g/dL (ref 30.0–36.0)
MCV: 91.4 fL (ref 78.0–100.0)
Platelets: 196 10*3/uL (ref 150–400)
RBC: 3.73 MIL/uL — ABNORMAL LOW (ref 4.22–5.81)
RDW: 16.8 % — ABNORMAL HIGH (ref 11.5–15.5)
WBC: 5.3 10*3/uL (ref 4.0–10.5)

## 2016-03-18 LAB — COMPREHENSIVE METABOLIC PANEL
ALBUMIN: 2.2 g/dL — AB (ref 3.5–5.0)
ALT: 11 U/L — AB (ref 17–63)
AST: 19 U/L (ref 15–41)
Alkaline Phosphatase: 75 U/L (ref 38–126)
Anion gap: 8 (ref 5–15)
BUN: 28 mg/dL — AB (ref 6–20)
CHLORIDE: 103 mmol/L (ref 101–111)
CO2: 25 mmol/L (ref 22–32)
CREATININE: 2.55 mg/dL — AB (ref 0.61–1.24)
Calcium: 8.6 mg/dL — ABNORMAL LOW (ref 8.9–10.3)
GFR calc Af Amer: 27 mL/min — ABNORMAL LOW (ref >60)
GFR, EST NON AFRICAN AMERICAN: 23 mL/min — AB (ref >60)
GLUCOSE: 190 mg/dL — AB (ref 65–99)
Potassium: 3.1 mmol/L — ABNORMAL LOW (ref 3.5–5.1)
SODIUM: 136 mmol/L (ref 135–145)
Total Bilirubin: 1.3 mg/dL — ABNORMAL HIGH (ref 0.3–1.2)
Total Protein: 5.3 g/dL — ABNORMAL LOW (ref 6.5–8.1)

## 2016-03-18 LAB — CORTISOL-AM, BLOOD: Cortisol - AM: 2.7 ug/dL — ABNORMAL LOW (ref 6.7–22.6)

## 2016-03-18 LAB — GLUCOSE, CAPILLARY
GLUCOSE-CAPILLARY: 212 mg/dL — AB (ref 65–99)
GLUCOSE-CAPILLARY: 272 mg/dL — AB (ref 65–99)
Glucose-Capillary: 344 mg/dL — ABNORMAL HIGH (ref 65–99)
Glucose-Capillary: 136 mg/dL — ABNORMAL HIGH (ref 65–99)

## 2016-03-18 LAB — PROTIME-INR
INR: 1.85
Prothrombin Time: 21.5 seconds — ABNORMAL HIGH (ref 11.4–15.2)

## 2016-03-18 LAB — MRSA PCR SCREENING: MRSA by PCR: POSITIVE — AB

## 2016-03-18 MED ORDER — HEPARIN (PORCINE) IN NACL 100-0.45 UNIT/ML-% IJ SOLN
950.0000 [IU]/h | INTRAMUSCULAR | Status: DC
  Administered 2016-03-18: 950 [IU]/h via INTRAVENOUS
  Filled 2016-03-18: qty 250

## 2016-03-18 MED ORDER — INSULIN GLARGINE 100 UNIT/ML ~~LOC~~ SOLN
10.0000 [IU] | Freq: Every day | SUBCUTANEOUS | Status: DC
  Administered 2016-03-18 – 2016-03-19 (×2): 10 [IU] via SUBCUTANEOUS
  Filled 2016-03-18 (×3): qty 0.1

## 2016-03-18 MED ORDER — POLYETHYLENE GLYCOL 3350 17 G PO PACK
17.0000 g | PACK | Freq: Every day | ORAL | Status: DC
  Administered 2016-03-18 – 2016-03-20 (×3): 17 g via ORAL
  Filled 2016-03-18 (×3): qty 1

## 2016-03-18 MED ORDER — PIPERACILLIN-TAZOBACTAM 3.375 G IVPB
3.3750 g | Freq: Two times a day (BID) | INTRAVENOUS | Status: DC
  Administered 2016-03-18 – 2016-03-20 (×4): 3.375 g via INTRAVENOUS
  Filled 2016-03-18 (×6): qty 50

## 2016-03-18 MED ORDER — INSULIN ASPART 100 UNIT/ML ~~LOC~~ SOLN
4.0000 [IU] | Freq: Three times a day (TID) | SUBCUTANEOUS | Status: DC
  Administered 2016-03-18 (×2): 4 [IU] via SUBCUTANEOUS

## 2016-03-18 MED ORDER — WARFARIN SODIUM 2 MG PO TABS
2.0000 mg | ORAL_TABLET | Freq: Once | ORAL | Status: AC
  Administered 2016-03-18: 2 mg via ORAL
  Filled 2016-03-18: qty 1

## 2016-03-18 MED ORDER — MUPIROCIN 2 % EX OINT
1.0000 "application " | TOPICAL_OINTMENT | Freq: Two times a day (BID) | CUTANEOUS | Status: DC
  Administered 2016-03-18 – 2016-03-20 (×4): 1 via NASAL
  Filled 2016-03-18 (×2): qty 22

## 2016-03-18 MED ORDER — WARFARIN - PHARMACIST DOSING INPATIENT
Freq: Every day | Status: DC
  Administered 2016-03-18: 18:00:00

## 2016-03-18 MED ORDER — OXYCODONE HCL 5 MG PO TABS
5.0000 mg | ORAL_TABLET | Freq: Once | ORAL | Status: AC
  Administered 2016-03-18: 5 mg via ORAL
  Filled 2016-03-18: qty 1

## 2016-03-18 MED ORDER — CHLORHEXIDINE GLUCONATE CLOTH 2 % EX PADS
6.0000 | MEDICATED_PAD | Freq: Every day | CUTANEOUS | Status: DC
Start: 2016-03-18 — End: 2016-03-20
  Administered 2016-03-18 – 2016-03-20 (×3): 6 via TOPICAL

## 2016-03-18 MED ORDER — INSULIN ASPART 100 UNIT/ML ~~LOC~~ SOLN: 0.0000 [IU] | Freq: Every day | SUBCUTANEOUS | Status: DC

## 2016-03-18 MED ORDER — INSULIN ASPART 100 UNIT/ML ~~LOC~~ SOLN
0.0000 [IU] | Freq: Three times a day (TID) | SUBCUTANEOUS | Status: DC
  Administered 2016-03-18: 8 [IU] via SUBCUTANEOUS
  Administered 2016-03-18: 11 [IU] via SUBCUTANEOUS
  Administered 2016-03-19: 3 [IU] via SUBCUTANEOUS
  Administered 2016-03-19: 5 [IU] via SUBCUTANEOUS
  Administered 2016-03-20: 3 [IU] via SUBCUTANEOUS

## 2016-03-18 NOTE — Progress Notes (Signed)
Inpatient Diabetes Program Recommendations  AACE/ADA: New Consensus Statement on Inpatient Glycemic Control (2015)  Target Ranges:  Prepandial:   less than 140 mg/dL      Peak postprandial:   less than 180 mg/dL (1-2 hours)      Critically ill patients:  140 - 180 mg/dL   Lab Results  Component Value Date   GLUCAP 212 (H) 03/18/2016   HGBA1C 6.7 (H) 02/12/2016    Review of Glycemic Control Results for Mark [name], Mark [name] (MRN [id number]) as of 03/18/2016 08:50  Ref. Range 03/17/2016 17:42 03/17/2016 22:07 03/18/2016 07:54  Glucose-Capillary Latest Ref Range: 65 - 99 mg/dL 338 (H) 274 (H) 212 (H)    Diabetes history: Type 2 Outpatient Diabetes medications: Novolog 0-12 units tid, Lantus 5 units qhs  Current orders for Inpatient glycemic control: Lantus 10 units qhs, Novolog 0-15 units tid, Novolog 0-5 units qhs, Novolog 4 units tid  Inpatient Diabetes Program Recommendations:   Please change diet to heart healthy/carb modified diet.   Gentry Fitz, RN, BA, MHA, CDE Diabetes Coordinator Inpatient Diabetes Program  [phone] (Team Pager) [phone] ([place]) 03/18/2016 8:57 AM

## 2016-03-18 NOTE — Progress Notes (Signed)
[facility] for heparin Indication: atrial fibrillation  Allergies  Allergen Reactions  . Tape Other (See Comments)    SKIN WILL TEAR!!    Patient Measurements: Height: 5\' 9"  (175.3 cm) Weight: 162 lb 0.6 oz (73.5 kg) IBW/kg (Calculated) : 70.7 Heparin Dosing Weight: 73.5kg  Vital Signs: Temp: 97.6 F (36.4 C) (12/13 0458) Temp Source: Oral (12/13 0458) BP: 118/67 (12/13 0458) Pulse Rate: 78 (12/13 0458)  Labs:  Recent Labs  03/17/16 1207 03/17/16 2305  HGB 12.4*  --   HCT 38.0*  --   PLT 193  --   LABPROT  --  22.3*  INR  --  1.92  CREATININE 2.33*  --     Estimated Creatinine Clearance: 27.4 mL/min (by C-G formula based on SCr of 2.33 mg/dL (H)).   Assessment: 75 yo male admitted with chronic foot wounds, presented with increased erythema, induration and enlargement of black eschar.  He is on warfarin prior to admission for AFib- home dose 2mg  daily. Warfarin currently being held in anticipation of the needs for surgery for lower extremity wounds.  INR this morning 1.92. Hgb 12.4, plts 193- no bleeding noted.  Goal of Therapy:  Heparin level 0.3-0.7 units/ml Monitor platelets by anticoagulation protocol: Yes    Plan:  -start heparin at 950 units/hr (NO BOLUS d/t high baseline INR) -heparin level in 8 hours -daily heparin level and CBC -follow surgical plans, s/s bleeding  Simrit Gohlke D. Agnes Probert, PharmD, BCPS Clinical Pharmacist Pager: [phone] 03/18/2016 8:49 AM

## 2016-03-18 NOTE — Clinical Social Work Note (Addendum)
Clinical Social Work Assessment  Patient Details  Name: Mark [name] MRN: [id number] Date of Birth: 17-Apr-1940  Date of referral:  03/18/16               Reason for consult:  Facility Placement (From [facility])                Permission sought to share information with:  Family Supports Permission granted to share information::  Yes, Verbal Permission Granted  Name::     [employer]  Agency::     Relationship::  Daughter  Contact Information:  [phone]  Housing/Transportation Living arrangements for the past 2 months:  [place] of Information:  Patient Patient Interpreter Needed:  None Criminal Activity/Legal Involvement Pertinent to Current Situation/Hospitalization:  No - Comment as needed Significant Relationships:  Adult Children Lives with:  Adult Children (Patient's daughter Freada Bergeron lives with him) Do you feel safe going back to the place where you live?  No (Patient came to hospital from [facility] (rehab) and plans to return and continue his rehab) Need for family participation in patient care:  Yes (Comment)  Care giving concerns:  Patient expressed understanding that he needs to continue his rehab at [facility] before returning home.   Social Worker assessment / plan:  CSW talked with patient at the bedside regarding discharge disposition. Mr. Graul was sitting up in bed and was alert, oriented and welcomed CSW into his room. Patient confirmed that he is from [facility] and plans to return and complete his rehab at discharge. Mr. Shellman talked with CSW about the his insurance and the number of days he has left for rehab. Patient advised that he lives in [place] but has a [place] address. His son Evangeline Gula & his wife also lives in [place]. Mr. 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   CSW contacted Derenda Mis, admissions director at [facility] and confirmed that patient can  return to facility at discharge.  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 Patient from skilled facility)  Patient/Family's Response to care:  Patient reported that he has no concerns regarding his care during this hospitalization. He informed CSW that per MD, he will not have any surgery on his feet.  Patient/Family's Understanding of and Emotional Response to Diagnosis, Current Treatment, and Prognosis:  Not discussed.  Emotional Assessment Appearance:  Appears stated age Attitude/Demeanor/Rapport:  Other (Appropriate) Affect (typically observed):  Pleasant, Appropriate Orientation:  Oriented to Self, Oriented to Place, Oriented to  Time, Oriented to Situation Alcohol / Substance use:  Never Used Psych involvement (Current and /or in the community):  No (Comment)  Discharge Needs  Concerns to be addressed:  Discharge Planning Concerns Readmission within the last 30 days:  No Current discharge risk:  None Barriers to Discharge:  No Barriers Identified   Sable Feil, LCSW 03/18/2016, 1:56 PM

## 2016-03-18 NOTE — Progress Notes (Signed)
[facility] for warfarin Indication: atrial fibrillation  Allergies  Allergen Reactions  . Tape Other (See Comments)    SKIN WILL TEAR!!    Patient Measurements: Height: 5\' 9"  (175.3 cm) Weight: 162 lb 0.6 oz (73.5 kg) IBW/kg (Calculated) : 70.7 Heparin Dosing Weight: 73.5kg  Vital Signs: Temp: 97.7 F (36.5 C) (12/13 0917) Temp Source: Oral (12/13 0917) BP: 130/70 (12/13 0917) Pulse Rate: 91 (12/13 0917)  Labs:  Recent Labs  03/17/16 1207 03/17/16 2305 03/18/16 0847  HGB 12.4*  --  11.1*  HCT 38.0*  --  34.1*  PLT 193  --  196  LABPROT  --  22.3* 21.5*  INR  --  1.92 1.85  CREATININE 2.33*  --  2.55*    Estimated Creatinine Clearance: 25 mL/min (by C-G formula based on SCr of 2.55 mg/dL (H)).   Assessment: 75 yo male admitted with chronic foot wounds, presented with increased erythema, induration and enlargement of black eschar.  He is on warfarin prior to admission for AFib- home dose 2mg  daily. Warfarin was being held, but now to restart.  INR this morning 1.92. Hgb 12.4, plts 193- no bleeding noted.  Goal of Therapy:  INR 2-3 Monitor platelets by anticoagulation protocol: Yes    Plan:  -warfarin 2mg  po x1 tonight -daily INR and CBC -follow surgical plans, s/s bleeding  Saidi Santacroce D. Majestic Brister, PharmD, BCPS Clinical Pharmacist Pager: [phone] 03/18/2016 2:33 PM

## 2016-03-18 NOTE — Progress Notes (Signed)
VASCULAR LAB PRELIMINARY  ARTERIAL  ABI completed: Unable to obtain bilateral ABI's due to vessel non-compressibility. Monophasic waveforms suggest abnormal arterial flow at rest. 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  Unable to obtain TBI's due to right great toe ulcer and left great toe amputation.   RIGHT    LEFT    PRESSURE WAVEFORM  PRESSURE WAVEFORM  BRACHIAL >255 Monophasic BRACHIAL AVF   DP >255 Monophasic DP >255 Monophasic  PT >255 Monophasic PT >255 Monophasic    RIGHT LEFT  ABI       Legrand Como, RVT 03/18/2016, 9:09 AM

## 2016-03-18 NOTE — Progress Notes (Signed)
Pharmacy Antibiotic Note  Mark [name] is a 75 y.o. male admitted on 03/17/2016 with suspected osteomyelitis of lower extremities- with cellulitis and gangrene. MRI of left and right legs have been completed- awaiting   Pharmacy has been consulted for vancomycin/zosyn dosing. Afebrile, WBC wnl. SCr 2.33 on admit (1.09 on 02/16/2016), CrCl~27.  Plan: -Vancomycin 750mg  IV qHD- nothing entered at this time as HD schedule not yet determined -Zosyn 3.375g IV q12h (each dose given over 4h) -Monitor clinical progress, c/s, surgical plans, HD schedule/tolerance  Height: 5\' 9"  (175.3 cm) Weight: 162 lb 0.6 oz (73.5 kg) IBW/kg (Calculated) : 70.7  Temp (24hrs), Avg:97.8 F (36.6 C), Min:97.6 F (36.4 C), Max:98 F (36.7 C)   Recent Labs Lab 03/17/16 1207  WBC 6.0  CREATININE 2.33*    Estimated Creatinine Clearance: 27.4 mL/min (by C-G formula based on SCr of 2.33 mg/dL (H)).    Allergies  Allergen Reactions  . Tape Other (See Comments)    SKIN WILL TEAR!!   Antimicrobials this admission:  Vanc 12/12 >>  Zosyn 12/12 >>   Dose adjustments this admission:  N/a  Microbiology results:  12/12 BCx: sent 12/12 MRSA PCR: positive  Tia Gelb D. Caddie Randle, PharmD, BCPS Clinical Pharmacist Pager: [phone] 03/18/2016 8:47 AM

## 2016-03-18 NOTE — Progress Notes (Signed)
Progress Note    CASSON GEWIRTZ  [id number] DOB: 19-Aug-1940  DOA: 03/17/2016 PCP: Rochel Brome, MD    Brief Narrative:   Chief complaint: Follow-up foot wounds  Mark [name] is an 75 y.o. male with a PMH of chronic foot wounds, diabetes, neuropathy, hypertension, gout and hypothyroidism, ESRD on dialysis in atrial fibrillation who was admitted 03/17/16 with a chief complaint of worsening foot wounds. He was seen by vascular surgery on admission and per their notes: He underwent angiography on 11/05/2015 and had successful atherectomy and drug coated balloon angioplasty of a left popliteal stenosis and angioplasty of a left anterior tibial artery that was occluded. 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 He was referred to wound care and was scheduled for f/u back in Oct. 2017, but failed to follow-up.  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 Continue vancomycin and Zosyn. Follow-up ABIs. May need palliative care consultation as the patient is resistant to further surgical intervention. MRI of the left and right foot done 03/17/16 and personally reviewed, not indicative of osteomyelitis.  Active Problems:   MRSA carrier Contact precautions. Decontamination therapy ordered.    Gout Continue allopurinol.    Diabetes mellitus with complication (HCC) Currently being managed with 5 units of Lantus daily and insulin sensitive SSI Q AC/HS. CBGs 212-338. Increased Lantus to 10 units and change SSI to moderate scale with 4 units of NovoLog meal coverage every before meals.    Chronic a-fib (HCC) Currently being managed with IV heparin, Coumadin on hold. We'll resume Coumadin  since it is unlikely that the patient will need any kind of surgical intervention at this point. Continue metoprolol for rate control.    ESRD on dialysis (HCC)/history of kidney transplant Nephrology consulted for continuation of HD. Continue immunosuppressive with Prograf/prednisone.    Chronic diastolic congestive heart failure (HCC) Monitor for signs of decompensation.    BPH (benign prostatic hyperplasia) Continue Flomax.    Pressure injury of skin Skin care per nursing. Heel protectors ordered.    Hypothyroidism Continue Synthroid.    Hyperlipidemia Continue Crestor.    GERD Continue Protonix.    Hypokalemia Will avoid placing on supplementation given end-stage renal disease.   Family Communication/Anticipated D/C date and plan/Code Status   DVT prophylaxis: Coumadin. Code Status: Full Code.  Family Communication: No family at the bedside. Message left for daughter. Disposition Plan: From [facility]. Discharge back when cellulitis shows clear signs of improvement.   Medical Consultants:    Vascular Surgery   Procedures:    ABIs 03/18/16  Anti-Infectives:    Vancomycin 03/17/16--->  Zosyn 03/17/16--->  Subjective:   The patient reports that appetite is good.  Last BM was 3-4 days ago. Review of symptoms is positive for bilateral heel pain and negative for dyspnea, fever/chills.   Objective:    Vitals:   03/17/16 1600 03/17/16 1733 03/17/16 2209 03/18/16 0458  BP: 149/79 136/80 124/65 118/67  Pulse:  (!) 108 78 78  Resp:  18 20 16   Temp:  98 F (36.7 C) 97.8 F (36.6 C) 97.6 F (36.4 C)  TempSrc:  Oral Oral Oral  SpO2: 100% 100% 100% 99%  Weight:   73.5 kg (162 lb 0.6 oz)  Height:        Intake/Output Summary (Last 24 hours) at 03/18/16 0756 Last data filed at 03/18/16 0604  Gross per 24 hour  Intake              600 ml  Output              100 ml  Net              500 ml   Filed Weights   03/17/16 1103 03/17/16 2209  Weight:  73.5 kg (162 lb) 73.5 kg (162 lb 0.6 oz)    Exam: General exam: Appears calm and comfortable.  Respiratory system: Clear to auscultation. Respiratory effort normal. Cardiovascular system: HSIR. No JVD,  rubs, gallops or clicks. No murmurs. Gastrointestinal system: Abdomen is nondistended, soft and nontender. No organomegaly or masses felt. Normal bowel sounds heard. Central nervous system: Alert and oriented x 2. No focal neurological deficits. Extremities/Skin: Left great toe amputated with eschar medially and erythema at amputation site, 2nd left toe erythematous. Right great toe erythematous with scab dorsal, erythema and venous stasis dermatitis right lower leg. Scattered petechiae to the upper extremities. Pale skin. Psychiatry: Judgement and insight appear normal. Mood & affect appropriate.   Data Reviewed:   I have personally reviewed following labs and imaging studies:  Labs: Basic Metabolic Panel:  Recent Labs Lab 03/17/16 1207  NA 134*  K 3.3*  CL 99*  CO2 23  GLUCOSE 261*  BUN 20  CREATININE 2.33*  CALCIUM 8.6*   GFR Estimated Creatinine Clearance: 27.4 mL/min (by C-G formula based on SCr of 2.33 mg/dL (H)). Liver Function Tests: No results for input(s): AST, ALT, ALKPHOS, BILITOT, PROT, ALBUMIN in the last 168 hours. No results for input(s): LIPASE, AMYLASE in the last 168 hours. No results for input(s): AMMONIA in the last 168 hours. Coagulation profile  Recent Labs Lab 03/17/16 2305  INR 1.92    CBC:  Recent Labs Lab 03/17/16 1207  WBC 6.0  NEUTROABS 3.9  HGB 12.4*  HCT 38.0*  MCV 91.3  PLT 193   CBG:  Recent Labs Lab 03/17/16 1742 03/17/16 2207 03/18/16 0754  GLUCAP 338* 274* 212*   Microbiology Recent Results (from the past 240 hour(s))  MRSA PCR Screening     Status: Abnormal   Collection Time: 03/17/16 10:31 PM  Result Value Ref Range Status   MRSA by PCR POSITIVE (A) NEGATIVE Final    Comment:        The GeneXpert MRSA Assay  (FDA approved for NASAL specimens only), is one component of a comprehensive MRSA colonization surveillance program. It is not intended to diagnose MRSA infection nor to guide or monitor treatment for MRSA infections. RESULT CALLED TO, READ BACK BY AND VERIFIED WITHBerlinda Last RN [id number] 03/18/16 A BROWNING     Radiology: Dg Foot Complete Left  Result Date: 03/17/2016 CLINICAL DATA:  Left foot pain and infection. History of first toe amputation. EXAM: LEFT FOOT - COMPLETE 3+ VIEW COMPARISON:  10/28/2015 FINDINGS: Status post first toe amputation with resection of the first distal and proximal phalanges. There is some bony irregularity involving the distal aspect of the first metatarsal. Component of osteomyelitis at this level cannot be excluded and correlation suggested with any clinical evidence of soft tissue infection at the level of the base of prior amputation. Moderate degenerative disease remains present throughout the foot as well as extensive diabetic vascular calcifications. No acute fractures identified. IMPRESSION: Status post first toe amputation  with resection of distal and proximal phalanges. There is some bony irregularity involving the distal aspect of the first metatarsal and component of osteomyelitis cannot be excluded. Electronically Signed   By: Aletta Edouard M.D.   On: 03/17/2016 13:27   Dg Toe Great Right  Result Date: 03/17/2016 CLINICAL DATA:  Pain and swelling and 8 right great toe. EXAM: RIGHT GREAT TOE COMPARISON:  None. FINDINGS: Degenerative changes involving the DIP and PIP joints of the great toe but no destructive bony changes to suggest osteomyelitis. Extensive small vessel calcifications are noted. IMPRESSION: No definite plain film findings for osteomyelitis. Electronically Signed   By: Marijo Sanes M.D.   On: 03/17/2016 13:21    Medications:   . allopurinol  150 mg Oral Daily  . Chlorhexidine Gluconate Cloth  6 each Topical Q0600  . insulin aspart   0-5 Units Subcutaneous QHS  . insulin aspart  0-9 Units Subcutaneous TID WC  . insulin glargine  5 Units Subcutaneous QHS  . levothyroxine  125 mcg Oral QAC breakfast  . metoprolol succinate  50 mg Oral Daily  . multivitamin  1 tablet Oral QHS  . mupirocin ointment  1 application Nasal BID  . OLANZapine  5 mg Oral QHS  . pantoprazole  40 mg Oral BID  . piperacillin-tazobactam (ZOSYN)  IV  3.375 g Intravenous Q8H  . predniSONE  5 mg Oral Daily  . rosuvastatin  5 mg Oral QPM  . sodium chloride flush  3 mL Intravenous Q12H  . tacrolimus  0.5 mg Oral BID  . tamsulosin  0.4 mg Oral QPC supper  . vancomycin  750 mg Intravenous Q24H   Continuous Infusions:  Medical decision making is of high complexity, therefore this is a level 3 visit.     LOS: 1 day   Sakara Lehtinen  [facility] Pager [phone]. If unable to reach me by pager, please call my cell phone at [phone].  *Please refer to [email or web address], password [email or web address] to get updated schedule on who will round on this patient, as hospitalists switch teams weekly. If 7PM-7AM, please contact night-coverage at [email or web address], password [email or web address] for any overnight needs.  03/18/2016, 7:56 AM

## 2016-03-19 ENCOUNTER — Encounter (HOSPITAL_COMMUNITY): Payer: Medicare Other

## 2016-03-19 ENCOUNTER — Ambulatory Visit: Payer: Medicare Other | Admitting: Family

## 2016-03-19 ENCOUNTER — Inpatient Hospital Stay (HOSPITAL_COMMUNITY): Payer: Medicare Other

## 2016-03-19 DIAGNOSIS — I739 Peripheral vascular disease, unspecified: Secondary | ICD-10-CM

## 2016-03-19 LAB — VAS US LOWER EXTREMITY ARTERIAL DUPLEX
LPOPDPSV: -28 cm/s
LPOPPPSV: -24 cm/s
LSFPPSV: 82 cm/s
Left ant tibial mid sys: 44 cm/s
Left super femoral dist sys PSV: -100 cm/s
Left super femoral mid sys PSV: -59 cm/s
RATIBMIDSYS: -64 cm/s
RPOPDPSV: -28 cm/s
RPOPPPSV: 39 cm/s
RSFDPSV: -66 cm/s
RSFMPSV: 67 cm/s
RSFPPSV: 56 cm/s
left post tibial mid sys: 31 cm/s

## 2016-03-19 LAB — GLUCOSE, CAPILLARY
GLUCOSE-CAPILLARY: 98 mg/dL (ref 65–99)
GLUCOSE-CAPILLARY: 181 mg/dL — AB (ref 65–99)
Glucose-Capillary: 222 mg/dL — ABNORMAL HIGH (ref 65–99)
Glucose-Capillary: 90 mg/dL (ref 65–99)

## 2016-03-19 MED ORDER — INSULIN ASPART 100 UNIT/ML ~~LOC~~ SOLN
6.0000 [IU] | Freq: Three times a day (TID) | SUBCUTANEOUS | Status: DC
  Administered 2016-03-19 – 2016-03-20 (×2): 6 [IU] via SUBCUTANEOUS

## 2016-03-19 MED ORDER — BISACODYL 10 MG RE SUPP
10.0000 mg | Freq: Once | RECTAL | Status: DC
  Filled 2016-03-19: qty 1

## 2016-03-19 MED ORDER — WARFARIN SODIUM 3 MG PO TABS
3.0000 mg | ORAL_TABLET | Freq: Once | ORAL | Status: AC
  Administered 2016-03-19: 3 mg via ORAL
  Filled 2016-03-19: qty 1

## 2016-03-19 MED ORDER — NEPRO/CARBSTEADY PO LIQD
237.0000 mL | Freq: Three times a day (TID) | ORAL | Status: DC
  Administered 2016-03-20 (×2): 237 mL via ORAL

## 2016-03-19 MED ORDER — VANCOMYCIN HCL IN DEXTROSE 750-5 MG/150ML-% IV SOLN
750.0000 mg | INTRAVENOUS | Status: DC
  Administered 2016-03-20: 750 mg via INTRAVENOUS
  Filled 2016-03-19: qty 150

## 2016-03-19 NOTE — Progress Notes (Addendum)
[facility] Progress Note  Subjective    Says his heels are sore.    Objective Vitals:   03/18/16 2124 03/19/16 0508  BP: 139/78 (!) 147/81  Pulse: 82 86  Resp: 18 18  Temp: 97.4 F (36.3 C) 97.6 F (36.4 C)    Intake/Output Summary (Last 24 hours) at 03/19/16 0837 Last data filed at 03/19/16 0207  Gross per 24 hour  Intake              960 ml  Output                0 ml  Net              960 ml   Feet in prevalon boots Left 2nd toe dry gangrene at tip. No drainage. No odor. Dry eschar to medial 1st MTP. Dry eschar to right great toe.  Erythema of feet improving  Assessment/Planning: 75 y.o. male with dry gangrene left 2nd toe, left great toe amputation site and right great toe dry eschar, bilateral lower extremity cellulitis  No osteomyelitis on bilateral feet. Continue abx for cellulitis.  ABIs unable to obtain due to calcified vessels. No plans for surgical intervention or angiography. Can monitor wounds as outpatient.  Patient refusing any future surgical procedures.   Mark [name] 03/19/2016 8:37 AM --  Laboratory CBC    Component Value Date/Time   WBC 5.3 03/18/2016 0847   HGB 11.1 (L) 03/18/2016 0847   HCT 34.1 (L) 03/18/2016 0847   PLT 196 03/18/2016 0847    BMET    Component Value Date/Time   NA 136 03/18/2016 0847   K 3.1 (L) 03/18/2016 0847   CL 103 03/18/2016 0847   CO2 25 03/18/2016 0847   GLUCOSE 190 (H) 03/18/2016 0847   BUN 28 (H) 03/18/2016 0847   CREATININE 2.55 (H) 03/18/2016 0847   CALCIUM 8.6 (L) 03/18/2016 0847   GFRNONAA 23 (L) 03/18/2016 0847   GFRAA 27 (L) 03/18/2016 0847    COAG Lab Results  Component Value Date   INR 1.85 03/18/2016   INR 1.92 03/17/2016   INR 2.62 02/17/2016   No results found for: PTT  Antibiotics Anti-infectives    Start     Dose/Rate Route Frequency Ordered Stop   03/18/16 2000  piperacillin-tazobactam (ZOSYN) IVPB 3.375 g     3.375 g 12.5 mL/hr over 240 Minutes  Intravenous Every 12 hours 03/18/16 0832     03/18/16 1600  vancomycin (VANCOCIN) IVPB 750 mg/150 ml premix  Status:  Discontinued     750 mg 150 mL/hr over 60 Minutes Intravenous Every 24 hours 03/17/16 1543 03/18/16 0832   03/18/16 0000  piperacillin-tazobactam (ZOSYN) IVPB 3.375 g  Status:  Discontinued     3.375 g 12.5 mL/hr over 240 Minutes Intravenous Every 8 hours 03/17/16 1542 03/18/16 0832   03/17/16 1545  vancomycin (VANCOCIN) 1,750 mg in sodium chloride 0.9 % 500 mL IVPB     1,750 mg 250 mL/hr over 120 Minutes Intravenous  Once 03/17/16 1542 03/17/16 1911   03/17/16 1545  piperacillin-tazobactam (ZOSYN) IVPB 3.375 g     3.375 g 100 mL/hr over 30 Minutes Intravenous  Once 03/17/16 1542 03/17/16 1711       Mark Jock, PA-C [facility] Office: [phone] Pager: [phone] 03/19/2016 8:37 AM   Agree with the above.  No surgical intervention at this time.  Continue current treatment plan with abx and wound care.  I will have  him follow up in the office next month  Mark [name]

## 2016-03-19 NOTE — Progress Notes (Signed)
Inpatient Diabetes Program Recommendations  AACE/ADA: New Consensus Statement on Inpatient Glycemic Control (2015)  Target Ranges:  Prepandial:   less than 140 mg/dL      Peak postprandial:   less than 180 mg/dL (1-2 hours)      Critically ill patients:  140 - 180 mg/dL   Lab Results  Component Value Date   GLUCAP 136 (H) 03/18/2016   HGBA1C 6.7 (H) 02/12/2016    Review of Glycemic Control  Results for PRINSTON, PIANKA (MRN [id number]) as of 03/19/2016 08:22  Ref. Range 03/18/2016 07:54 03/18/2016 11:43 03/18/2016 16:26 03/18/2016 21:22 03/19/2016 08:18  Glucose-Capillary Latest Ref Range: 65 - 99 mg/dL 212 (H) 272 (H) 344 (H) 136 (H) 222 (H)    Diabetes history: Type 2 Outpatient Diabetes medications: Novolog 0-12 units tid, Lantus 5 units qhs  Current orders for Inpatient glycemic control: Lantus 10 units qhs, Novolog 0-15 units tid, Novolog 0-5 units qhs, Novolog 4 units tid   *prednisone 5mg /day  Inpatient Diabetes Program Recommendations:Agree with current medications for blood sugar management.    Gentry Fitz, RN, BA, MHA, CDE Diabetes Coordinator Inpatient Diabetes Program  [phone] (Team Pager) [phone] ([place]) 03/19/2016 8:23 AM

## 2016-03-19 NOTE — Consult Note (Signed)
[facility] Renal Consultation Note    Indication for Consultation:  Management of ESRD/hemodialysis, anemia, hypertension/volume, and secondary hyperparathyroidism. PCP:  HPI: Mark [name] is a 75 y.o. male with ESRD, CHF, DM, HTN, hypothyroidism, Hx gout, atrial fibrillation (on warfarin) who was admitted on 03/17/16 with B leg cellulitis and worsened L necrotic foot wounds. Has had chronic foot wounds which are followed weekly, noted to be worse on last visit and sent to ED. Denies fever, chills, chest pain, dyspnea, abdominal pain. Appetite is ok. Started on Vanc/Zosyn for cellulitis. Vascular surgery has evaluated him with no plans for surgery at this time.  S/p recent admit 02/2016 for abdominal pain and weight loss, improved with rest and IVF. Found to have mild pancreatitis and yeast UTI; treated with diflucan.  From renal standpoint, s/p kidney transplant in 2005, did reasonably well x 12 years, baseline Cr ~2.6 (per notes). 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 He underwent leg angiography and AKI initially felt to be contrast-induced, but no recovery. He has been on twice weekly (M,F) dialysis since that time. During last admit, his Cr was down in 1's (very low muscle mass), as outpatient Cr has been between 2.5-3.5 pre-HD. Last HD 12/11, next due for HD on 03/20/16.   Past Medical History:  Diagnosis Date  . Atrial fibrillation ([place])   . CHF (congestive heart failure) ([place])   . Diabetes ([place])   . Diabetic neuropathy ([place])   . Gout   . Hypertension   . Hypothyroidism   . 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 Renal disorder    kidney transplant   . Ulcer of other part of foot 12/15/2013   Past Surgical History:  Procedure Laterality Date  . AMPUTATION Left 11/06/2015   Procedure: AMPUTATION LEFT GREAT TOE;  Surgeon: Elam Dutch, MD;  Location: [place];  Service: Vascular;  Laterality: Left;  . ANKLE FRACTURE SURGERY    . AV FISTULA  PLACEMENT Left 11/19/2015   Procedure: LEFT ARM ARTERIOVENOUS (AV) FISTULA CREATION;  Surgeon: Waynetta Sandy, MD;  Location: [place];  Service: Vascular;  Laterality: Left;  . CATARACT EXTRACTION    . INSERTION OF DIALYSIS CATHETER Right 11/19/2015   Procedure: INSERTION OF DIALYSIS CATHETER RIGHT INTERNAL JUGULAR;  Surgeon: Waynetta Sandy, MD;  Location: [place];  Service: Vascular;  Laterality: Right;  . IR GENERIC HISTORICAL  11/14/2015   IR FLUORO GUIDE CV LINE RIGHT 11/14/2015 Markus Daft, MD [facility]  . IR GENERIC HISTORICAL  11/14/2015   IR US GUIDE VASC ACCESS RIGHT 11/14/2015 Markus Daft, MD [facility]  . KIDNEY TRANSPLANT  2012  . MELANOMA EXCISION    . NEPHRECTOMY TRANSPLANTED ORGAN    . PERIPHERAL VASCULAR CATHETERIZATION N/A 11/05/2015   Procedure: Abdominal Aortogram w/Lower Extremity;  Surgeon: Serafina Mitchell, MD;  Location: [place] [facility];  Service: Cardiovascular;  Laterality: N/A;  . PERIPHERAL VASCULAR CATHETERIZATION Left 11/05/2015   Procedure: Peripheral Vascular Atherectomy;  Surgeon: Serafina Mitchell, MD;  Location: [place] [facility];  Service: Cardiovascular;  Laterality: Left;  popiteal  . PERIPHERAL VASCULAR CATHETERIZATION Left 11/05/2015   Procedure: Peripheral Vascular Balloon Angioplasty;  Surgeon: Serafina Mitchell, MD;  Location: [place] [facility];  Service: Cardiovascular;  Laterality: Left;  anterior tib   Family History  Problem Relation Age of Onset  . Bradycardia Mother   . Stroke Father    Social History:  reports that he has never smoked. He  has never used smokeless tobacco. He reports that he does not drink alcohol or use drugs.  ROS: As per HPI otherwise negative.  Physical Exam: Vitals:   03/18/16 0917 03/18/16 1729 03/18/16 2124 03/19/16 0508  BP: 130/70 (!) 102/54 139/78 (!) 147/81  Pulse: 91 80 82 86  Resp: 17 18 18 18   Temp: 97.7 F (36.5 C) 97.7 F (36.5 C) 97.4 F (36.3 C) 97.6 F (36.4 C)  TempSrc: Oral Oral Oral  Oral  SpO2: 100% 100% 100% 99%  Weight:   74.2 kg (163 lb 9.3 oz)   Height:         General: Frail, elderly male in no acute distress. Head: Normocephalic, atraumatic, sclera non-icteric, mucus membranes are moist. Neck: Supple without lymphadenopathy/masses. JVD not elevated. Lungs: Clear bilaterally to auscultation without wheezes, rales, or rhonchi. Breathing is unlabored. Heart: RRR with normal S1, S2. No murmurs, rubs, or gallops appreciated. Abdomen: Soft, non-tender, non-distended with normoactive bowel sounds. No rebound/guarding. Transplanted kidney palpable in L lower abdomen. Lower extremities: No edema. Erythema on B LE (R>L); necrotic areas on L 2nd toe and base of amputated 1st toe base. Neuro: Alert and oriented X 3. Moves all extremities spontaneously. Psych:  Responds to questions appropriately with a normal affect. Dialysis Access: TDC in R chest  Allergies  Allergen Reactions  . Tape Other (See Comments)    SKIN WILL TEAR!!   Prior to Admission medications   Medication Sig Start Date End Date Taking? Authorizing Provider  acetaminophen (TYLENOL) 500 MG tablet Take 500 mg by mouth every 6 (six) hours as needed for mild pain or moderate pain.   Yes Historical Provider, MD  allopurinol (ZYLOPRIM) 300 MG tablet Take 150 mg by mouth in the morning 11/26/15  Yes Silver Huguenin Elgergawy, MD  feeding supplement (BOOST / RESOURCE BREEZE) LIQD Take 1 Container by mouth 3 (three) times daily with meals. 11/26/15  Yes Silver Huguenin Elgergawy, MD  fluconazole (DIFLUCAN) 150 MG tablet Take 150 mg by mouth daily.   Yes Historical Provider, MD  insulin aspart (NOVOLOG) 100 UNIT/ML injection Inject 0-12 Units into the skin 3 (three) times daily before meals.   Yes Historical Provider, MD  insulin glargine (LANTUS) 100 UNIT/ML injection Inject 0.05 mLs (5 Units total) into the skin at bedtime. 02/17/16  Yes Sela Hua, MD  ketoconazole (NIZORAL) 2 % shampoo Apply 1 application topically 2 (two)  times a week.  12/14/13  Yes Historical Provider, MD  levalbuterol Penne Lash) 1.25 MG/0.5ML nebulizer solution Take 1.25 mg by nebulization every 6 (six) hours as needed for wheezing or shortness of breath. 11/26/15  Yes Albertine Patricia, MD  levothyroxine (SYNTHROID, LEVOTHROID) 125 MCG tablet Take 1 tablet (125 mcg total) by mouth daily before breakfast. 02/18/16  Yes Sela Hua, MD  metoprolol succinate (TOPROL-XL) 50 MG 24 hr tablet Take 50 mg by mouth daily.  06/18/12  Yes Historical Provider, MD  multivitamin (RENA-VIT) TABS tablet Take 1 tablet by mouth at bedtime. 11/26/15  Yes Silver Huguenin Elgergawy, MD  nystatin (MYCOSTATIN/NYSTOP) 100000 UNIT/GM POWD Apply 1 g topically daily as needed (irritation).  12/14/12  Yes Historical Provider, MD  OLANZapine (ZYPREXA) 5 MG tablet Take 5 mg by mouth at bedtime.   Yes Historical Provider, MD  pantoprazole (PROTONIX) 40 MG tablet Take 1 tablet (40 mg total) by mouth 2 (two) times daily. 11/26/15  Yes Silver Huguenin Elgergawy, MD  predniSONE (DELTASONE) 5 MG tablet Take 5 mg by mouth daily. 03/15/14  Yes Historical Provider, MD  rosuvastatin (CRESTOR) 5 MG tablet Take 5 mg by mouth every evening.   Yes Historical Provider, MD  tacrolimus (PROGRAF) 0.5 MG capsule Take 0.5 mg by mouth 2 (two) times daily.   Yes Historical Provider, MD  tamsulosin (FLOMAX) 0.4 MG CAPS capsule Take 1 capsule (0.4 mg total) by mouth daily after supper. 11/26/15  Yes Albertine Patricia, MD  vitamin B-12 (CYANOCOBALAMIN) 100 MCG tablet Take 100 mcg by mouth daily.   Yes Historical Provider, MD  warfarin (COUMADIN) 2 MG tablet Take 2 mg by mouth at bedtime.   Yes Historical Provider, MD  warfarin (COUMADIN) 1 MG tablet Take 1mg  daily. Recheck INR on 11/14 and adjust dose as appropriate. Patient not taking: Reported on 03/17/2016 02/17/16   Sela Hua, MD   Current Facility-Administered Medications  Medication Dose Route Frequency Provider Last Rate Last Dose  . 0.9 %  sodium  chloride infusion  250 mL Intravenous PRN Waldemar Dickens, MD      . acetaminophen (TYLENOL) tablet 650 mg  650 mg Oral Q6H PRN Waldemar Dickens, MD   650 mg at 03/19/16 [id number]   Or  . acetaminophen (TYLENOL) suppository 650 mg  650 mg Rectal Q6H PRN Waldemar Dickens, MD      . allopurinol (ZYLOPRIM) tablet 150 mg  150 mg Oral Daily Waldemar Dickens, MD   150 mg at 03/19/16 1150  . bisacodyl (DULCOLAX) suppository 10 mg  10 mg Rectal Once Venetia Maxon Rama, MD      . Chlorhexidine Gluconate Cloth 2 % PADS 6 each  6 each Topical Q0600 Royanne Foots, MD   6 each at 03/19/16 [phone]  . insulin aspart (novoLOG) injection 0-15 Units  0-15 Units Subcutaneous TID WC Venetia Maxon Rama, MD   5 Units at 03/19/16 [phone]  . insulin aspart (novoLOG) injection 0-5 Units  0-5 Units Subcutaneous QHS Christina P Rama, MD      . insulin aspart (novoLOG) injection 6 Units  6 Units Subcutaneous TID WC Christina P Rama, MD      . insulin glargine (LANTUS) injection 10 Units  10 Units Subcutaneous QHS Venetia Maxon Rama, MD   10 Units at 03/18/16 2227  . levalbuterol (XOPENEX) nebulizer solution 1.25 mg  1.25 mg Nebulization Q6H PRN Waldemar Dickens, MD      . levothyroxine (SYNTHROID, LEVOTHROID) tablet 125 mcg  125 mcg Oral QAC breakfast Waldemar Dickens, MD   125 mcg at 03/19/16 [phone]  . metoprolol succinate (TOPROL-XL) 24 hr tablet 50 mg  50 mg Oral Daily Waldemar Dickens, MD   50 mg at 03/19/16 1150  . multivitamin (RENA-VIT) tablet 1 tablet  1 tablet Oral QHS Waldemar Dickens, MD   1 tablet at 03/18/16 2226  . mupirocin ointment (BACTROBAN) 2 % 1 application  1 application Nasal BID Royanne Foots, MD   1 application at [phone] [id number]  . OLANZapine (ZYPREXA) tablet 5 mg  5 mg Oral QHS Waldemar Dickens, MD   5 mg at 03/18/16 2227  . ondansetron (ZOFRAN) tablet 4 mg  4 mg Oral Q6H PRN Waldemar Dickens, MD       Or  . ondansetron [facility]) injection 4 mg  4 mg Intravenous Q6H PRN Waldemar Dickens, MD      . oxyCODONE (Oxy  IR/ROXICODONE) immediate release tablet 5 mg  5 mg Oral Q4H PRN Waldemar Dickens, MD   5 mg at 03/19/16  1151  . pantoprazole (PROTONIX) EC tablet 40 mg  40 mg Oral BID Waldemar Dickens, MD   40 mg at 03/19/16 1151  . piperacillin-tazobactam (ZOSYN) IVPB 3.375 g  3.375 g Intravenous Q12H Lauren D Bajbus, RPH   3.375 g at 03/19/16 0836  . polyethylene glycol (MIRALAX / GLYCOLAX) packet 17 g  17 g Oral Daily Venetia Maxon Rama, MD   17 g at 03/19/16 1150  . predniSONE (DELTASONE) tablet 5 mg  5 mg Oral Daily Waldemar Dickens, MD   5 mg at 03/19/16 1151  . rosuvastatin (CRESTOR) tablet 5 mg  5 mg Oral QPM Waldemar Dickens, MD   5 mg at 03/18/16 1754  . sodium chloride flush (NS) 0.9 % injection 3 mL  3 mL Intravenous Q12H Waldemar Dickens, MD   3 mL at 03/17/16 2221  . sodium chloride flush (NS) 0.9 % injection 3 mL  3 mL Intravenous PRN Waldemar Dickens, MD      . tacrolimus (PROGRAF) capsule 0.5 mg  0.5 mg Oral BID Waldemar Dickens, MD   0.5 mg at 03/19/16 1150  . tamsulosin (FLOMAX) capsule 0.4 mg  0.4 mg Oral QPC supper Waldemar Dickens, MD   0.4 mg at 03/18/16 1754  . warfarin (COUMADIN) tablet 3 mg  3 mg Oral ONCE-1800 Venetia Maxon Rama, MD      . Warfarin - Pharmacist Dosing Inpatient   Does not apply q1800 Briscoe Burns, [facility]       Labs: Basic Metabolic Panel:  Recent Labs Lab 03/17/16 1207 03/18/16 0847  NA 134* 136  K 3.3* 3.1*  CL 99* 103  CO2 23 25  GLUCOSE 261* 190*  BUN 20 28*  CREATININE 2.33* 2.55*  CALCIUM 8.6* 8.6*   Liver Function Tests:  Recent Labs Lab 03/18/16 0847  AST 19  ALT 11*  ALKPHOS 75  BILITOT 1.3*  PROT 5.3*  ALBUMIN 2.2*  CBC:  Recent Labs Lab 03/17/16 1207 03/18/16 0847  WBC 6.0 5.3  NEUTROABS 3.9  --   HGB 12.4* 11.1*  HCT 38.0* 34.1*  MCV 91.3 91.4  PLT 193 196   CBG:  Recent Labs Lab 03/18/16 1143 03/18/16 1626 03/18/16 2122 03/19/16 0818 03/19/16 1208  GLUCAP 272* 344* 136* 222* 98   Mr Foot Right Wo Contrast  Result Date:  03/18/2016 CLINICAL DATA:  Chronic soft tissue wounds of the right foot. Pain. New areas of erythema, induration and black eschar. EXAM: MRI OF THE RIGHT FOOT WITHOUT CONTRAST TECHNIQUE: Multiplanar, multisequence MR imaging of the right foot was performed. No intravenous contrast was administered. COMPARISON:  Radiographs dated 03/17/2016 FINDINGS: Bones/Joint/Cartilage There is slight arthritis at the first MTP joint as well as at the IP joint of the great toe with a small effusion at the IP joint of the great toe and slight edema in the adjacent portions of the proximal and distal phalanges. No bone destruction. No other significant bone abnormality. Muscles and Tendons Fatty atrophy of the muscles of the foot consistent neuropathy. Soft tissues No soft tissue abscesses.  No discrete soft tissue ulcer is. IMPRESSION: Arthritic changes of the IP joint of the great toe and at the first MTP joint. No findings of osteomyelitis or soft tissue abscess. Electronically Signed   By: Lorriane Shire M.D.   On: 03/18/2016 09:36   Mr Foot Left Wo Contrast  Result Date: 03/18/2016 CLINICAL DATA:  Chronic progressive foot wounds with new areas of erythema, induration and  black eschar. Previous amputation of the great toe. EXAM: MRI OF THE LEFT FOOT WITHOUT CONTRAST TECHNIQUE: Multiplanar, multisequence MR imaging of the left foot was performed. No intravenous contrast was administered. COMPARISON:  Radiographs dated 03/17/2016 and 10/28/2015 FINDINGS: Bones/Joint/Cartilage The great toe is been amputated. The distal aspect of the first metatarsal has been debrided but there is no evidence suggestive of osteomyelitis of stump of the head of the first metatarsal. There is increased signal intensity from the distal phalanx of the second toe on T2 weighted imaging but there is no bone destruction. The other bones demonstrate no significant abnormalities. Ligaments Intact. Muscles and Tendons Fatty atrophy of the muscles  consistent with the patient's history of peripheral neuropathy. Soft tissues Soft tissue ulceration adjacent to the stump of the head of the first metatarsal. Soft tissue loss at the tip of the second toe. IMPRESSION: 1. No findings consistent with osteomyelitis. Previous debridement of the distal first metatarsal accounts for the bone irregularity on radiographs. 2. Abnormal signal from the distal phalanx of the second toe is consistent with inflammation but not diagnostic for osteomyelitis. 3. No soft tissue abscesses. Electronically Signed   By: Lorriane Shire M.D.   On: 03/18/2016 09:32    Dialysis Orders:  Mon, Fri schedule at [facility] 4 hours, 180 dialyzer, BFR 350, DFR 800, EDW 77kg, 3K/2.25Ca - Heparin 3000 unit bolus - No VDRA or ESA  Assessment/Plan: 1.  Cellulitis, B lower extremities: With necrotic changes on L 2nd toe. BCx 12/12 NGTD. No osteomyelitis on imaging. Continue Vanc/Zosyn. Vascular surgery has evaluated; no plans for surgery at this time. 2.  ESRD: Continue HD per M, F schedule for now, next due 12/15. Cr remains low (2.5), but still rising between HD sessions and he has low muscle mass. Once stable, could consider trending labs off HD. K 3.1, will use 3K bath with HD since only dialyzing 2x/weekly. 3.  Hypertension/volume: BP slightly high, lower yesterday. He is below is outpt EDW currently. Low UF goal with next HD. 4.  Anemia: Hgb 11.1. No ESA needed for now. 5.  Metabolic bone disease: Ca 8.6, will check Phos in AM. No VDRA needed for now. 6.  Nutrition: Alb 2.2. Start Nepro protein supplements. 7. Hx Kidney Transplant (2005) with residual function: Continue prednisone/Prograf. 8. DM: Continue insulin. Per primary 9. Atrial fibrillation: On warfarin. Per primary. 10. Hypothyroidism: Per primary.  Veneta Penton, PA-C 03/19/2016, 1:31 PM  [facility] Pager: [phone]  Pt seen, examined and agree w A/P as above.  Kelly Splinter  MD [employer] pager [phone]   03/19/2016, 3:07 PM

## 2016-03-19 NOTE — Progress Notes (Signed)
VASCULAR LAB PRELIMINARY  PRELIMINARY  PRELIMINARY  PRELIMINARY  Bilateral lower extremity arterial duplex completed.    Preliminary report:  Bilateral: significant calcific plaque and monophasic flow throughout.  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 Right PTA appears to have collateral flow.  Right ATA appears patent. Left: No significant stenosis noted.  Waveforms have dramatically changed since previous study done 12/22/15 as waveforms were triphasic and are now monophasic.  Mark [name], RVT 03/19/2016, 2:19 PM

## 2016-03-19 NOTE — Progress Notes (Signed)
Pharmacy Antibiotic Note  Mark [name] is a 75 y.o. male admitted on 03/17/2016 with cellulitis and gangrene of lower extremities. No osteomyelitis on MRI of left and right legs. Patient is refusing surgery. Pharmacy has been consulted for vancomycin/zosyn dosing.   Patient is ESRD, but only on dialysis on Mondays and Fridays. Last HD session 12/11 PTA, plans to go to HD tomorrow 12/15  Plan: -Vancomycin 750mg  IV qHD on Mondays and Fridays -Zosyn 3.375g IV q12h (each dose given over 4h) -Monitor clinical progress, c/s, HD schedule/tolerance, LOT  Height: 5\' 9"  (175.3 cm) Weight: 163 lb 9.3 oz (74.2 kg) IBW/kg (Calculated) : 70.7  Temp (24hrs), Avg:97.6 F (36.4 C), Min:97.4 F (36.3 C), Max:97.7 F (36.5 C)   Recent Labs Lab 03/17/16 1207 03/18/16 0847  WBC 6.0 5.3  CREATININE 2.33* 2.55*    Estimated Creatinine Clearance: 25 mL/min (by C-G formula based on SCr of 2.55 mg/dL (H)).    Allergies  Allergen Reactions  . Tape Other (See Comments)    SKIN WILL TEAR!!   Antimicrobials this admission:  Vanc 12/12 >>  Zosyn 12/12 >>   Dose adjustments this admission:  N/a  Microbiology results:  12/12 BCx: ngtd 12/12 MRSA PCR: positive  Morey Andonian D. Avila Albritton, PharmD, BCPS Clinical Pharmacist Pager: [phone] 03/19/2016 2:29 PM

## 2016-03-19 NOTE — Progress Notes (Signed)
Progress Note    Mark [name]  [id number] DOB: 09/18/1940  DOA: 03/17/2016 PCP: Rochel Brome, MD    Brief Narrative:   Chief complaint: Follow-up foot wounds  Mark [name] is an 75 y.o. male with a PMH of chronic foot wounds, diabetes, neuropathy, hypertension, gout and hypothyroidism, ESRD on dialysis in atrial fibrillation who was admitted 03/17/16 with a chief complaint of worsening foot wounds. He was seen by vascular surgery on admission and per their notes: He underwent angiography on 11/05/2015 and had successful atherectomy and drug coated balloon angioplasty of a left popliteal stenosis and angioplasty of a left anterior tibial artery that was occluded. 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 He was referred to wound care and was scheduled for f/u back in Oct. 2017, but failed to follow-up.  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 Continue vancomycin and Zosyn. ABIs unable to be calculated due to vessel noncompressibility. MRI of the left and right foot done 03/17/16 and personally reviewed, not indicative of osteomyelitis.Blood cultures negative to date.  Active Problems:   MRSA carrier Contact precautions. Decontamination therapy ordered.    Gout Continue allopurinol.    Diabetes mellitus with complication (HCC) Currently being managed with 10 units of Lantus daily, moderate scale SSI Q AC/HS with 4 units of meal coverage. CBGs 136-344. We'll increase meal coverage to 6 units.    Chronic a-fib (HCC) Continue Coumadin per pharmacy dosing. Continue metoprolol for rate control.    ESRD on dialysis (HCC)/history of kidney transplant Nephrology consulted for  continuation of HD. Continue immunosuppressive with Prograf/prednisone.    Chronic diastolic congestive heart failure (HCC) Monitor for signs of decompensation.    BPH (benign prostatic hyperplasia) Continue Flomax.    Pressure injury of skin Skin care per nursing. Heel protectors ordered.    Hypothyroidism Continue Synthroid.    Hyperlipidemia Continue Crestor.    GERD Continue Protonix.    Hypokalemia Will avoid placing on supplementation given end-stage renal disease.   Family Communication/Anticipated D/C date and plan/Code Status   DVT prophylaxis: Coumadin. Code Status: Full Code.  Family Communication: No family at the bedside. Message left for daughter. Disposition Plan: From [facility]. Discharge back when cellulitis shows clear signs of improvement, Likely in the next 24-48 hours.   Medical Consultants:    Vascular Surgery   Procedures:    ABIs 03/18/16  Anti-Infectives:    Vancomycin 03/17/16--->  Zosyn 03/17/16--->  Subjective:   The patient reports that appetite is still good. Reports some abdominal pain/spasms.  Review of symptoms is positive for bilateral heel pain and constipation x 1 week, and negative for dyspnea, fever/chills.   Objective:    Vitals:   03/18/16 0917 03/18/16 1729 03/18/16 2124 03/19/16 0508  BP: 130/70 (!) 102/54 139/78 (!) 147/81  Pulse: 91 80 82 86  Resp: 17 18 18 18   Temp: 97.7 F (36.5 C) 97.7 F (36.5 C) 97.4 F (36.3 C) 97.6 F (36.4 C)  TempSrc: Oral Oral Oral Oral  SpO2: 100% 100% 100% 99%  Weight:   74.2 kg (163 lb 9.3 oz)   Height:        Intake/Output Summary (Last 24 hours) at 03/19/16 0820 Last data filed at  03/19/16 0207  Gross per 24 hour  Intake              960 ml  Output                0 ml  Net              960 ml   Filed Weights   03/17/16 1103 03/17/16 2209 03/18/16 2124  Weight: 73.5 kg (162 lb) 73.5 kg (162 lb 0.6 oz) 74.2 kg (163 lb 9.3 oz)    Exam: General exam: Appears  calm and comfortable.  Respiratory system: Clear to auscultation. Respiratory effort normal. Cardiovascular system: HSIR. No JVD,  rubs, gallops or clicks. No murmurs. Gastrointestinal system: Abdomen is nondistended, soft and nontender. No organomegaly or masses felt. Normal bowel sounds heard. Central nervous system: Alert and oriented x 2. No focal neurological deficits. Extremities/Skin: Left great toe amputated with eschar medially and erythema at amputation site, 2nd left toe erythematous. Right great toe erythematous with scab dorsal, erythema and venous stasis dermatitis right lower leg. Scattered petechiae to the upper extremities. Pale skin. Wounds as documented below. Psychiatry: Judgement and insight appear normal. Mood & affect appropriate.   Right foot:   Left foot:     Data Reviewed:   I have personally reviewed following labs and imaging studies:  Labs: Basic Metabolic Panel:  Recent Labs Lab 03/17/16 1207 03/18/16 0847  NA 134* 136  K 3.3* 3.1*  CL 99* 103  CO2 23 25  GLUCOSE 261* 190*  BUN 20 28*  CREATININE 2.33* 2.55*  CALCIUM 8.6* 8.6*   GFR Estimated Creatinine Clearance: 25 mL/min (by C-G formula based on SCr of 2.55 mg/dL (H)). Liver Function Tests:  Recent Labs Lab 03/18/16 0847  AST 19  ALT 11*  ALKPHOS 75  BILITOT 1.3*  PROT 5.3*  ALBUMIN 2.2*   Coagulation profile  Recent Labs Lab 03/17/16 2305 03/18/16 0847  INR 1.92 1.85    CBC:  Recent Labs Lab 03/17/16 1207 03/18/16 0847  WBC 6.0 5.3  NEUTROABS 3.9  --   HGB 12.4* 11.1*  HCT 38.0* 34.1*  MCV 91.3 91.4  PLT 193 196   CBG:  Recent Labs Lab 03/17/16 2207 03/18/16 0754 03/18/16 1143 03/18/16 1626 03/18/16 2122  GLUCAP 274* 212* 272* 344* 136*   Microbiology Recent Results (from the past 240 hour(s))  Blood culture (routine x 2)     Status: None (Preliminary result)   Collection Time: 03/17/16 12:05 PM  Result Value Ref Range Status   Specimen  Description BLOOD RIGHT FOREARM  Final   Special Requests BOTTLES DRAWN AEROBIC AND ANAEROBIC 5ML  Final   Culture NO GROWTH 1 DAY  Final   Report Status PENDING  Incomplete  Blood culture (routine x 2)     Status: None (Preliminary result)   Collection Time: 03/17/16 12:22 PM  Result Value Ref Range Status   Specimen Description BLOOD RIGHT ANTECUBITAL  Final   Special Requests BOTTLES DRAWN AEROBIC AND ANAEROBIC 5CC  Final   Culture NO GROWTH 1 DAY  Final   Report Status PENDING  Incomplete  MRSA PCR Screening     Status: Abnormal   Collection Time: 03/17/16 10:31 PM  Result Value Ref Range Status   MRSA by PCR POSITIVE (A) NEGATIVE Final    Comment:        The GeneXpert MRSA Assay (FDA approved for NASAL specimens only), is one component of a comprehensive MRSA colonization surveillance program.  It is not intended to diagnose MRSA infection nor to guide or monitor treatment for MRSA infections. RESULT CALLED TO, READ BACK BY AND VERIFIED WITHBerlinda Last RN [id number] 03/18/16 A BROWNING     Radiology: Mr Foot Right Wo Contrast  Result Date: 03/18/2016 CLINICAL DATA:  Chronic soft tissue wounds of the right foot. Pain. New areas of erythema, induration and black eschar. EXAM: MRI OF THE RIGHT FOOT WITHOUT CONTRAST TECHNIQUE: Multiplanar, multisequence MR imaging of the right foot was performed. No intravenous contrast was administered. COMPARISON:  Radiographs dated 03/17/2016 FINDINGS: Bones/Joint/Cartilage There is slight arthritis at the first MTP joint as well as at the IP joint of the great toe with a small effusion at the IP joint of the great toe and slight edema in the adjacent portions of the proximal and distal phalanges. No bone destruction. No other significant bone abnormality. Muscles and Tendons Fatty atrophy of the muscles of the foot consistent neuropathy. Soft tissues No soft tissue abscesses.  No discrete soft tissue ulcer is. IMPRESSION: Arthritic changes of the IP  joint of the great toe and at the first MTP joint. No findings of osteomyelitis or soft tissue abscess. Electronically Signed   By: Lorriane Shire M.D.   On: 03/18/2016 09:36   Mr Foot Left Wo Contrast  Result Date: 03/18/2016 CLINICAL DATA:  Chronic progressive foot wounds with new areas of erythema, induration and black eschar. Previous amputation of the great toe. EXAM: MRI OF THE LEFT FOOT WITHOUT CONTRAST TECHNIQUE: Multiplanar, multisequence MR imaging of the left foot was performed. No intravenous contrast was administered. COMPARISON:  Radiographs dated 03/17/2016 and 10/28/2015 FINDINGS: Bones/Joint/Cartilage The great toe is been amputated. The distal aspect of the first metatarsal has been debrided but there is no evidence suggestive of osteomyelitis of stump of the head of the first metatarsal. There is increased signal intensity from the distal phalanx of the second toe on T2 weighted imaging but there is no bone destruction. The other bones demonstrate no significant abnormalities. Ligaments Intact. Muscles and Tendons Fatty atrophy of the muscles consistent with the patient's history of peripheral neuropathy. Soft tissues Soft tissue ulceration adjacent to the stump of the head of the first metatarsal. Soft tissue loss at the tip of the second toe. IMPRESSION: 1. No findings consistent with osteomyelitis. Previous debridement of the distal first metatarsal accounts for the bone irregularity on radiographs. 2. Abnormal signal from the distal phalanx of the second toe is consistent with inflammation but not diagnostic for osteomyelitis. 3. No soft tissue abscesses. Electronically Signed   By: Lorriane Shire M.D.   On: 03/18/2016 09:32   Dg Foot Complete Left  Result Date: 03/17/2016 CLINICAL DATA:  Left foot pain and infection. History of first toe amputation. EXAM: LEFT FOOT - COMPLETE 3+ VIEW COMPARISON:  10/28/2015 FINDINGS: Status post first toe amputation with resection of the first  distal and proximal phalanges. There is some bony irregularity involving the distal aspect of the first metatarsal. Component of osteomyelitis at this level cannot be excluded and correlation suggested with any clinical evidence of soft tissue infection at the level of the base of prior amputation. Moderate degenerative disease remains present throughout the foot as well as extensive diabetic vascular calcifications. No acute fractures identified. IMPRESSION: Status post first toe amputation with resection of distal and proximal phalanges. There is some bony irregularity involving the distal aspect of the first metatarsal and component of osteomyelitis cannot be excluded. Electronically Signed   By: Eulas Post  Kathlene Cote M.D.   On: 03/17/2016 13:27   Dg Toe Great Right  Result Date: 03/17/2016 CLINICAL DATA:  Pain and swelling and 8 right great toe. EXAM: RIGHT GREAT TOE COMPARISON:  None. FINDINGS: Degenerative changes involving the DIP and PIP joints of the great toe but no destructive bony changes to suggest osteomyelitis. Extensive small vessel calcifications are noted. IMPRESSION: No definite plain film findings for osteomyelitis. Electronically Signed   By: Marijo Sanes M.D.   On: 03/17/2016 13:21    Medications:   . allopurinol  150 mg Oral Daily  . Chlorhexidine Gluconate Cloth  6 each Topical Q0600  . insulin aspart  0-15 Units Subcutaneous TID WC  . insulin aspart  0-5 Units Subcutaneous QHS  . insulin aspart  4 Units Subcutaneous TID WC  . insulin glargine  10 Units Subcutaneous QHS  . levothyroxine  125 mcg Oral QAC breakfast  . metoprolol succinate  50 mg Oral Daily  . multivitamin  1 tablet Oral QHS  . mupirocin ointment  1 application Nasal BID  . OLANZapine  5 mg Oral QHS  . pantoprazole  40 mg Oral BID  . piperacillin-tazobactam (ZOSYN)  IV  3.375 g Intravenous Q12H  . polyethylene glycol  17 g Oral Daily  . predniSONE  5 mg Oral Daily  . rosuvastatin  5 mg Oral QPM  . sodium  chloride flush  3 mL Intravenous Q12H  . tacrolimus  0.5 mg Oral BID  . tamsulosin  0.4 mg Oral QPC supper  . Warfarin - Pharmacist Dosing Inpatient   Does not apply q1800   Continuous Infusions:  The patient is medically complex with multiple medical comorbidities which require evaluation of multiple data points and coordination of care with vascular surgery and nephrology, level III visit.     LOS: 2 days   RAMA,CHRISTINA  [facility] Pager [phone]. If unable to reach me by pager, please call my cell phone at [phone].  *Please refer to [email or web address], password [email or web address] to get updated schedule on who will round on this patient, as hospitalists switch teams weekly. If 7PM-7AM, please contact night-coverage at [email or web address], password [email or web address] for any overnight needs.  03/19/2016, 8:20 AM

## 2016-03-19 NOTE — Progress Notes (Addendum)
[place] for warfarin Indication: atrial fibrillation  Assessment: 75 yo male admitted with chronic foot wounds, presented with increased erythema, induration and enlargement of black eschar.  He is on warfarin prior to admission for AFib - home dose 2mg  daily, INR was 1.92 on admission. Warfarin was being held for possible surgery, but restarted 12/13 as patient is currently refusing surgery.  INR this morning 1.85, hgb 11.1, plts 196 - no bleeding noted.  Goal of Therapy:  INR 2-3 Monitor platelets by anticoagulation protocol: Yes   Plan:  -Warfarin 3mg  po x1 tonight -Daily INR and CBC -Follow surgical plans, s/s bleeding   Allergies  Allergen Reactions  . Tape Other (See Comments)    SKIN WILL TEAR!!    Patient Measurements: Height: 5\' 9"  (175.3 cm) Weight: 163 lb 9.3 oz (74.2 kg) IBW/kg (Calculated) : 70.7 Heparin Dosing Weight: 73.5kg  Vital Signs: Temp: 97.6 F (36.4 C) (12/14 0508) Temp Source: Oral (12/14 0508) BP: 147/81 (12/14 0508) Pulse Rate: 86 (12/14 0508)  Labs:  Recent Labs  03/17/16 1207 03/17/16 2305 03/18/16 0847  HGB 12.4*  --  11.1*  HCT 38.0*  --  34.1*  PLT 193  --  196  LABPROT  --  22.3* 21.5*  INR  --  1.92 1.85  CREATININE 2.33*  --  2.55*    Estimated Creatinine Clearance: 25 mL/min (by C-G formula based on SCr of 2.55 mg/dL (H)).  Belia Heman, PharmD PGY1 Pharmacy Resident [phone] (Pager) 03/19/2016 8:41 AM

## 2016-03-20 ENCOUNTER — Encounter (HOSPITAL_COMMUNITY): Payer: Self-pay | Admitting: Internal Medicine

## 2016-03-20 DIAGNOSIS — N400 Enlarged prostate without lower urinary tract symptoms: Secondary | ICD-10-CM | POA: Diagnosis not present

## 2016-03-20 DIAGNOSIS — E1129 Type 2 diabetes mellitus with other diabetic kidney complication: Secondary | ICD-10-CM | POA: Diagnosis not present

## 2016-03-20 DIAGNOSIS — L97512 Non-pressure chronic ulcer of other part of right foot with fat layer exposed: Secondary | ICD-10-CM | POA: Diagnosis not present

## 2016-03-20 DIAGNOSIS — I70209 Unspecified atherosclerosis of native arteries of extremities, unspecified extremity: Secondary | ICD-10-CM | POA: Diagnosis not present

## 2016-03-20 DIAGNOSIS — I5032 Chronic diastolic (congestive) heart failure: Secondary | ICD-10-CM | POA: Diagnosis not present

## 2016-03-20 DIAGNOSIS — R279 Unspecified lack of coordination: Secondary | ICD-10-CM | POA: Diagnosis not present

## 2016-03-20 DIAGNOSIS — L02419 Cutaneous abscess of limb, unspecified: Secondary | ICD-10-CM | POA: Diagnosis not present

## 2016-03-20 DIAGNOSIS — R2689 Other abnormalities of gait and mobility: Secondary | ICD-10-CM | POA: Diagnosis not present

## 2016-03-20 DIAGNOSIS — D631 Anemia in chronic kidney disease: Secondary | ICD-10-CM | POA: Diagnosis not present

## 2016-03-20 DIAGNOSIS — L89152 Pressure ulcer of sacral region, stage 2: Secondary | ICD-10-CM | POA: Diagnosis not present

## 2016-03-20 DIAGNOSIS — R41841 Cognitive communication deficit: Secondary | ICD-10-CM | POA: Diagnosis not present

## 2016-03-20 DIAGNOSIS — I120 Hypertensive chronic kidney disease with stage 5 chronic kidney disease or end stage renal disease: Secondary | ICD-10-CM | POA: Diagnosis not present

## 2016-03-20 DIAGNOSIS — T8189XD Other complications of procedures, not elsewhere classified, subsequent encounter: Secondary | ICD-10-CM | POA: Diagnosis not present

## 2016-03-20 DIAGNOSIS — N186 End stage renal disease: Secondary | ICD-10-CM | POA: Diagnosis not present

## 2016-03-20 DIAGNOSIS — Z992 Dependence on renal dialysis: Secondary | ICD-10-CM | POA: Diagnosis not present

## 2016-03-20 DIAGNOSIS — K5900 Constipation, unspecified: Secondary | ICD-10-CM | POA: Diagnosis not present

## 2016-03-20 DIAGNOSIS — N2581 Secondary hyperparathyroidism of renal origin: Secondary | ICD-10-CM | POA: Diagnosis not present

## 2016-03-20 DIAGNOSIS — I4891 Unspecified atrial fibrillation: Secondary | ICD-10-CM | POA: Diagnosis not present

## 2016-03-20 DIAGNOSIS — M6281 Muscle weakness (generalized): Secondary | ICD-10-CM | POA: Diagnosis not present

## 2016-03-20 DIAGNOSIS — E43 Unspecified severe protein-calorie malnutrition: Secondary | ICD-10-CM | POA: Diagnosis not present

## 2016-03-20 DIAGNOSIS — L03119 Cellulitis of unspecified part of limb: Secondary | ICD-10-CM | POA: Diagnosis not present

## 2016-03-20 DIAGNOSIS — L0390 Cellulitis, unspecified: Secondary | ICD-10-CM | POA: Diagnosis not present

## 2016-03-20 DIAGNOSIS — Z89412 Acquired absence of left great toe: Secondary | ICD-10-CM | POA: Diagnosis not present

## 2016-03-20 DIAGNOSIS — R1312 Dysphagia, oropharyngeal phase: Secondary | ICD-10-CM | POA: Diagnosis not present

## 2016-03-20 DIAGNOSIS — I482 Chronic atrial fibrillation: Secondary | ICD-10-CM | POA: Diagnosis not present

## 2016-03-20 DIAGNOSIS — L97522 Non-pressure chronic ulcer of other part of left foot with fat layer exposed: Secondary | ICD-10-CM | POA: Diagnosis not present

## 2016-03-20 DIAGNOSIS — R1310 Dysphagia, unspecified: Secondary | ICD-10-CM | POA: Diagnosis not present

## 2016-03-20 LAB — CBC
HCT: 35.8 % — ABNORMAL LOW (ref 39.0–52.0)
Hemoglobin: 11.6 g/dL — ABNORMAL LOW (ref 13.0–17.0)
MCH: 30 pg (ref 26.0–34.0)
MCHC: 32.4 g/dL (ref 30.0–36.0)
MCV: 92.5 fL (ref 78.0–100.0)
PLATELETS: 232 10*3/uL (ref 150–400)
RBC: 3.87 MIL/uL — AB (ref 4.22–5.81)
RDW: 16.9 % — ABNORMAL HIGH (ref 11.5–15.5)
WBC: 6.2 10*3/uL (ref 4.0–10.5)

## 2016-03-20 LAB — RENAL FUNCTION PANEL
ALBUMIN: 2 g/dL — AB (ref 3.5–5.0)
Anion gap: 8 (ref 5–15)
BUN: 36 mg/dL — ABNORMAL HIGH (ref 6–20)
CALCIUM: 8.7 mg/dL — AB (ref 8.9–10.3)
CO2: 28 mmol/L (ref 22–32)
CREATININE: 2.64 mg/dL — AB (ref 0.61–1.24)
Chloride: 101 mmol/L (ref 101–111)
GFR calc non Af Amer: 22 mL/min — ABNORMAL LOW (ref >60)
GFR, EST AFRICAN AMERICAN: 26 mL/min — AB (ref >60)
Glucose, Bld: 150 mg/dL — ABNORMAL HIGH (ref 65–99)
PHOSPHORUS: 4.9 mg/dL — AB (ref 2.5–4.6)
Potassium: 4 mmol/L (ref 3.5–5.1)
SODIUM: 137 mmol/L (ref 135–145)

## 2016-03-20 LAB — GLUCOSE, CAPILLARY
GLUCOSE-CAPILLARY: 105 mg/dL — AB (ref 65–99)
Glucose-Capillary: 166 mg/dL — ABNORMAL HIGH (ref 65–99)
Glucose-Capillary: 117 mg/dL — ABNORMAL HIGH (ref 65–99)

## 2016-03-20 LAB — PROTIME-INR
INR: 2.01
Prothrombin Time: 23.1 seconds — ABNORMAL HIGH (ref 11.4–15.2)

## 2016-03-20 MED ORDER — HEPARIN SODIUM (PORCINE) 1000 UNIT/ML DIALYSIS: 1000.0000 [IU] | INTRAMUSCULAR | Status: DC | PRN

## 2016-03-20 MED ORDER — CEPHALEXIN 500 MG PO CAPS: 500.0000 mg | ORAL_CAPSULE | Freq: Three times a day (TID) | ORAL | 0 refills | Status: AC

## 2016-03-20 MED ORDER — OXYCODONE HCL 5 MG PO TABS
ORAL_TABLET | ORAL | Status: AC
  Filled 2016-03-20: qty 1

## 2016-03-20 MED ORDER — TERBINAFINE HCL 1 % EX CREA: 1.0000 "application " | TOPICAL_CREAM | Freq: Two times a day (BID) | CUTANEOUS | 0 refills | Status: AC

## 2016-03-20 MED ORDER — SODIUM CHLORIDE 0.9 % IV SOLN: 100.0000 mL | INTRAVENOUS | Status: DC | PRN

## 2016-03-20 MED ORDER — POLYETHYLENE GLYCOL 3350 17 G PO PACK: 17.0000 g | PACK | Freq: Every day | ORAL | 0 refills | Status: AC

## 2016-03-20 MED ORDER — ALTEPLASE 2 MG IJ SOLR: 2.0000 mg | Freq: Once | INTRAMUSCULAR | Status: DC | PRN

## 2016-03-20 MED ORDER — VANCOMYCIN HCL IN DEXTROSE 750-5 MG/150ML-% IV SOLN
INTRAVENOUS | Status: AC
  Filled 2016-03-20: qty 150

## 2016-03-20 MED ORDER — PANTOPRAZOLE SODIUM 40 MG PO TBEC: 40.0000 mg | DELAYED_RELEASE_TABLET | Freq: Every day | ORAL | Status: AC

## 2016-03-20 MED ORDER — WARFARIN SODIUM 2 MG PO TABS
2.0000 mg | ORAL_TABLET | Freq: Once | ORAL | Status: AC
  Administered 2016-03-20: 2 mg via ORAL
  Filled 2016-03-20: qty 1

## 2016-03-20 MED ORDER — DOXYCYCLINE HYCLATE 100 MG PO CAPS: 100.0000 mg | ORAL_CAPSULE | Freq: Two times a day (BID) | ORAL | 0 refills | Status: AC

## 2016-03-20 NOTE — Evaluation (Signed)
Physical Therapy Evaluation Patient Details Name: Mark [name] MRN: [id number] DOB: 1940-04-12 Today's Date: 03/20/2016   History of Present Illness  75 y.o.male admitted on 03/17/16 with B leg cellulitis and worsened L necrotic foot wounds. Has had chronic foot wounds which are followed weekly, noted to be worse on last visit and sent to ED. Vascular surgery has evaluated him with no plans for surgery at this time.PMH: ESRD, CHF, DM, HTN, hypothyroidism, Hx gout, atrial fibrillation (on warfarin), kidney transplant, Lt toe amputation.    Clinical Impression  Pt seen for initial evaluation and treatment. Pt willing to sit EOB but refusing to attempt standing or transfers. Based upon the patient's current mobility level, recommending SNF for further rehabilitation following his acute stay. PT to continue to follow and progress mobility as tolerated.     Follow Up Recommendations SNF;Supervision/Assistance - 24 hour    Equipment Recommendations   (to be addressed at next venue)    Recommendations for Other Services       Precautions / Restrictions Precautions Precautions: Fall Required Braces or Orthoses: Other Brace/Splint Other Brace/Splint: PRAFOs, off-loading wedge for Lt foot.  Restrictions Weight Bearing Restrictions: No      Mobility  Bed Mobility Overal bed mobility: Needs Assistance Bed Mobility: Supine to Sit;Sit to Supine Rolling: Min assist (pt using rail to assist)   Supine to sit: Mod assist (assist needed at trunk and LEs, pad to assist to EOB) Sit to supine: +2 for physical assistance;Max assist      Transfers                 General transfer comment: pt refusing to attempt standing despite encouragement  Ambulation/Gait                Stairs            Wheelchair Mobility    Modified Rankin (Stroke Patients Only)       Balance Overall balance assessment: Needs assistance Sitting-balance support: Bilateral upper  extremity supported Sitting balance-Leahy Scale: Poor Sitting balance - Comments: posterior lean                                     Pertinent Vitals/Pain Pain Assessment: 0-10 Pain Score: 8  Pain Location: abdomen Pain Descriptors / Indicators:  (will have to go to the bathroom soon) Pain Intervention(s): Monitored during session;Repositioned (pt declined sitting on [facility])    Home Living Family/patient expects to be discharged to:: Skilled nursing facility                 Additional Comments: reports staying at SNF PTA    Prior Function Level of Independence: Needs assistance   Gait / Transfers Assistance Needed: ambulating with PT at SNF, confirms minimal ambulation           Hand Dominance        Extremity/Trunk Assessment   Upper Extremity Assessment Upper Extremity Assessment: Generalized weakness    Lower Extremity Assessment Lower Extremity Assessment: Generalized weakness (able to raise LEs without assistance biefly.)       Communication   Communication: No difficulties  Cognition Arousal/Alertness: Awake/alert Behavior During Therapy: WFL for tasks assessed/performed;Agitated (varible between [facility] and agitated. ) Overall Cognitive Status: Within Functional Limits for tasks assessed                      General Comments  Exercises     Assessment/Plan    PT Assessment Patient needs continued PT services  PT Problem List Decreased strength;Decreased range of motion;Decreased activity tolerance;Decreased mobility;Decreased balance;Pain          PT Treatment Interventions DME instruction;Gait training;Functional mobility training;Therapeutic activities;Therapeutic exercise;Balance training;Patient/family education    PT Goals (Current goals can be found in the Care Plan section)  Acute Rehab PT Goals Patient Stated Goal: get foot healed and kidney working PT Goal Formulation: With patient Time For Goal  Achievement: 04/03/16 Potential to Achieve Goals: Fair    Frequency Min 2X/week   Barriers to discharge        Co-evaluation               End of Session   Activity Tolerance: Patient tolerated treatment well Patient left: in bed;with call bell/phone within reach;with bed alarm set (PRAFOs on) Nurse Communication: Mobility status         Time: [id number] PT Time Calculation (min) (ACUTE ONLY): 29 min   Charges:   PT Evaluation $PT Eval Moderate Complexity: 1 Procedure PT Treatments $Therapeutic Activity: 8-22 mins   PT G Codes:        Cassell Clement, PT, CSCS Pager [phone] Office [phone]  03/20/2016, 9:34 AM

## 2016-03-20 NOTE — Clinical Social Work Note (Signed)
Patient medically stable for discharge back to [facility] today. Facility notified and d/c clinicals transmitted to facility. Daughter contacted and informed of discharge. Mr. Haden will be transported by ambulance.  [place] Talerico Givens, MSW, LCSW Licensed Clinical Social Worker [place] [phone]

## 2016-03-20 NOTE — [place] FL2 (Signed)
[place] MEDICAID FL2 LEVEL OF CARE SCREENING TOOL     IDENTIFICATION  Patient Name: Mark [name] Birthdate: December 04, 1940 Sex: male Admission Date (Current Location): 03/17/2016  [facility] and [place] Number:  Administrator, Civil Service and Address:  [facility], [place] [address], [place], [place] [address]      Provider Number: [id number]  Attending Physician Name and Address:  Venetia Maxon Rama, MD  Relative Name and Phone Number:  Dustin Goette, daughter.  [phone]    Current Level of Care: Hospital Recommended Level of Care: [place] (From [place]) Prior Approval Number:    Date Approved/Denied:   PASRR Number: [id number]  Discharge Plan: SNF    Current Diagnoses: Patient Active Problem List   Diagnosis Date Noted  . Pressure injury of skin 03/18/2016  . 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 Dry gangrene ([place]) 03/17/2016  . Chronic atrial fibrillation ([place]) 03/17/2016  . ESRD on dialysis ([place]) 03/17/2016  . Chronic diastolic congestive heart failure ([place]) 03/17/2016  . BPH (benign prostatic hyperplasia) 03/17/2016  . Cellulitis of lower extremity   . Weight decrease   . Protein-calorie malnutrition, severe 02/14/2016  . Renal transplant recipient 10/28/2015  . Diabetes mellitus with complication ([place]) [id number]  . Cellulitis of great toe of left foot 10/28/2015  . Diabetic peripheral neuropathy ([place]) 06/12/2014  . Hammer toe of right foot 02/16/2014  . Onychomycosis 02/02/2014  . Ulcer of right foot ([place]) 01/19/2014  . Ulcer of right ankle ([place]) 01/08/2014  . Ulcer of other part of foot 12/15/2013  . Type II diabetes mellitus, uncontrolled ([place]) 10/20/2012  . Other and unspecified hyperlipidemia 10/20/2012  . Hypothyroidism 10/20/2012  . Edema extremities 10/20/2012  . Gout 10/20/2012    Orientation RESPIRATION BLADDER Height & Weight     Self, Time, Situation, Place  Normal Continent Weight: 168 lb  6.9 oz (76.4 kg) Height:  5\' 9"  (175.3 cm)  BEHAVIORAL SYMPTOMS/MOOD NEUROLOGICAL BOWEL NUTRITION STATUS      Continent Diet (Low sodium - Heart healthy)  AMBULATORY STATUS COMMUNICATION OF NEEDS Skin   Extensive Assist (Patient did not ambulate with PT during assessment on 12/15) Verbally Other (Comment), PU Stage and Appropriate Care (Cellulitis bilateral feet; left toe amputation; Stage 1 pressure ulcer bilateral sacrum.)                       Personal Care Assistance Level of Assistance  Bathing, Feeding, Dressing Bathing Assistance: Limited assistance Feeding assistance: Independent Dressing Assistance: Limited assistance     Functional Limitations Info  Sight, Hearing, Speech Sight Info: Adequate Hearing Info: Adequate Speech Info: Adequate    SPECIAL CARE FACTORS FREQUENCY  PT (By licensed PT)     PT Frequency: Evaluation 12/15 and a minimum of 2X per week therapy recommended.              Contractures Contractures Info: Not present    Additional Factors Info  Code Status, Allergies, Insulin Sliding Scale Code Status Info: Full Allergies Info: Tape   Insulin Sliding Scale Info: 0-5 Units daily at bedtime; 0-15 Units 3X per day with meals       Current Medications (03/20/2016):  This is the current hospital active medication list Current Facility-Administered Medications  Medication Dose Route Frequency Provider Last Rate Last Dose  . 0.9 %  sodium chloride infusion  250 mL Intravenous PRN Waldemar Dickens, MD      . acetaminophen (TYLENOL) tablet 650 mg  650 mg Oral Q6H PRN Waldemar Dickens, MD   650 mg at 03/19/16 [id number]   Or  . acetaminophen (TYLENOL) suppository 650 mg  650 mg Rectal Q6H PRN Waldemar Dickens, MD      . allopurinol (ZYLOPRIM) tablet 150 mg  150 mg Oral Daily Waldemar Dickens, MD   150 mg at 03/20/16 1046  . bisacodyl (DULCOLAX) suppository 10 mg  10 mg Rectal Once Venetia Maxon Rama, MD      . Chlorhexidine Gluconate Cloth 2 % PADS 6 each  6  each Topical Q0600 Royanne Foots, MD   6 each at 03/20/16 [phone]  . feeding supplement (NEPRO CARB STEADY) liquid 237 mL  237 mL Oral TID WC Loren Racer, PA-C   237 mL at 03/20/16 0905  . insulin aspart (novoLOG) injection 0-15 Units  0-15 Units Subcutaneous TID WC Venetia Maxon Rama, MD   3 Units at 03/20/16 [phone]  . insulin aspart (novoLOG) injection 0-5 Units  0-5 Units Subcutaneous QHS Christina P Rama, MD      . insulin aspart (novoLOG) injection 6 Units  6 Units Subcutaneous TID WC Venetia Maxon Rama, MD   6 Units at 03/20/16 [phone]  . insulin glargine (LANTUS) injection 10 Units  10 Units Subcutaneous QHS Venetia Maxon Rama, MD   10 Units at 03/19/16 2153  . levalbuterol (XOPENEX) nebulizer solution 1.25 mg  1.25 mg Nebulization Q6H PRN Waldemar Dickens, MD      . levothyroxine (SYNTHROID, LEVOTHROID) tablet 125 mcg  125 mcg Oral QAC breakfast Waldemar Dickens, MD   125 mcg at 03/20/16 0636  . metoprolol succinate (TOPROL-XL) 24 hr tablet 50 mg  50 mg Oral Daily Waldemar Dickens, MD   50 mg at 03/20/16 1047  . multivitamin (RENA-VIT) tablet 1 tablet  1 tablet Oral QHS Waldemar Dickens, MD   1 tablet at 03/19/16 2154  . mupirocin ointment (BACTROBAN) 2 % 1 application  1 application Nasal BID Royanne Foots, MD   1 application at [id number]  . OLANZapine (ZYPREXA) tablet 5 mg  5 mg Oral QHS Waldemar Dickens, MD   5 mg at 03/19/16 2154  . ondansetron (ZOFRAN) tablet 4 mg  4 mg Oral Q6H PRN Waldemar Dickens, MD       Or  . ondansetron [facility]) injection 4 mg  4 mg Intravenous Q6H PRN Waldemar Dickens, MD      . oxyCODONE (Oxy IR/ROXICODONE) immediate release tablet 5 mg  5 mg Oral Q4H PRN Waldemar Dickens, MD   5 mg at 03/19/16 1151  . pantoprazole (PROTONIX) EC tablet 40 mg  40 mg Oral BID Waldemar Dickens, MD   40 mg at 03/20/16 1047  . piperacillin-tazobactam (ZOSYN) IVPB 3.375 g  3.375 g Intravenous Q12H Lauren D Bajbus, RPH   3.375 g at 03/20/16 0904  . polyethylene glycol (MIRALAX / GLYCOLAX)  packet 17 g  17 g Oral Daily Venetia Maxon Rama, MD   17 g at 03/20/16 1047  . predniSONE (DELTASONE) tablet 5 mg  5 mg Oral Daily Waldemar Dickens, MD   5 mg at 03/20/16 1047  . rosuvastatin (CRESTOR) tablet 5 mg  5 mg Oral QPM Waldemar Dickens, MD   5 mg at 03/19/16 1808  . sodium chloride flush (NS) 0.9 % injection 3 mL  3 mL Intravenous Q12H Waldemar Dickens, MD   3 mL at 03/17/16 2221  . sodium chloride flush (  NS) 0.9 % injection 3 mL  3 mL Intravenous PRN Waldemar Dickens, MD      . tacrolimus (PROGRAF) capsule 0.5 mg  0.5 mg Oral BID Waldemar Dickens, MD   0.5 mg at 03/20/16 1047  . tamsulosin (FLOMAX) capsule 0.4 mg  0.4 mg Oral QPC supper Waldemar Dickens, MD   0.4 mg at 03/19/16 1808  . vancomycin (VANCOCIN) IVPB 750 mg/150 ml premix  750 mg Intravenous Q M,W,F-HD Lauren D Bajbus, RPH      . warfarin (COUMADIN) tablet 2 mg  2 mg Oral ONCE-1800 Christina P Rama, MD      . Warfarin - Pharmacist Dosing Inpatient   Does not apply q1800 Almeta Monas Bajbus, [place]         Discharge Medications: Please see discharge summary for a list of discharge medications.  Relevant Imaging Results:  Relevant Lab Results:   Additional Information SS# [id number]. Dialysis patient Mon & Fri. at [facility].  Sable Feil, LCSW

## 2016-03-20 NOTE — Progress Notes (Signed)
[facility] Progress Note   Subjective:  Feels ok, says he will be discharged back to SNF after HD today. No CP or dyspnea.  Objective Vitals:   03/19/16 1825 03/19/16 2149 03/20/16 0444 03/20/16 0951  BP: 135/77 129/63 136/68 (!) 146/79  Pulse: 89 84 88 (!) 106  Resp: 18 18 18 18   Temp: 97.2 F (36.2 C) 97.6 F (36.4 C) 97.6 F (36.4 C) 97.5 F (36.4 C)  TempSrc: Oral Oral Oral Oral  SpO2: 99% 100% 99% 100%  Weight:  76.4 kg (168 lb 6.9 oz)    Height:       Physical Exam General: Frail, elderly male. NAD. Heart: RRR; no murmur. Lungs: CTAB. Abdomen: Soft, non-tender.Transplanted kidney palpable in L lower abdomen. Extremities: No edema. B lower leg erythema (R>L), necrotic areas on L 2nd toe and base of 1st toe. Dialysis Access: [facility] in R chest  Additional Objective Labs: Basic Metabolic Panel:  Recent Labs Lab 03/17/16 1207 03/18/16 0847 03/20/16 0515  NA 134* 136 137  K 3.3* 3.1* 4.0  CL 99* 103 101  CO2 23 25 28   GLUCOSE 261* 190* 150*  BUN 20 28* 36*  CREATININE 2.33* 2.55* 2.64*  CALCIUM 8.6* 8.6* 8.7*  PHOS  --   --  4.9*   Liver Function Tests:  Recent Labs Lab 03/18/16 0847 03/20/16 0515  AST 19  --   ALT 11*  --   ALKPHOS 75  --   BILITOT 1.3*  --   PROT 5.3*  --   ALBUMIN 2.2* 2.0*   CBC:  Recent Labs Lab 03/17/16 1207 03/18/16 0847 03/20/16 0515  WBC 6.0 5.3 6.2  NEUTROABS 3.9  --   --   HGB 12.4* 11.1* 11.6*  HCT 38.0* 34.1* 35.8*  MCV 91.3 91.4 92.5  PLT 193 196 232   Blood Culture    Component Value Date/Time   SDES BLOOD RIGHT ANTECUBITAL 03/17/2016 1222   SPECREQUEST BOTTLES DRAWN AEROBIC AND ANAEROBIC 5CC 03/17/2016 1222   CULT NO GROWTH 2 DAYS 03/17/2016 1222   REPTSTATUS PENDING 03/17/2016 1222   CBG:  Recent Labs Lab 03/19/16 0818 03/19/16 1208 03/19/16 1740 03/19/16 2129 03/20/16 0752  GLUCAP 222* 98 181* 90 166*   Studies/Results: No results found. Medications:  . allopurinol  150  mg Oral Daily  . bisacodyl  10 mg Rectal Once  . Chlorhexidine Gluconate Cloth  6 each Topical Q0600  . feeding supplement (NEPRO CARB STEADY)  237 mL Oral TID WC  . insulin aspart  0-15 Units Subcutaneous TID WC  . insulin aspart  0-5 Units Subcutaneous QHS  . insulin aspart  6 Units Subcutaneous TID WC  . insulin glargine  10 Units Subcutaneous QHS  . levothyroxine  125 mcg Oral QAC breakfast  . metoprolol succinate  50 mg Oral Daily  . multivitamin  1 tablet Oral QHS  . mupirocin ointment  1 application Nasal BID  . OLANZapine  5 mg Oral QHS  . pantoprazole  40 mg Oral BID  . piperacillin-tazobactam (ZOSYN)  IV  3.375 g Intravenous Q12H  . polyethylene glycol  17 g Oral Daily  . predniSONE  5 mg Oral Daily  . rosuvastatin  5 mg Oral QPM  . sodium chloride flush  3 mL Intravenous Q12H  . tacrolimus  0.5 mg Oral BID  . tamsulosin  0.4 mg Oral QPC supper  . vancomycin  750 mg Intravenous Q M,W,F-HD  . warfarin  2 mg Oral ONCE-1800  .  Warfarin - Pharmacist Dosing Inpatient   Does not apply q1800    Dialysis Orders: Mon, Fri schedule at [facility] 4 hours, 180 dialyzer, BFR 350, DFR 800, EDW 77kg, 3K/2.25Ca - Heparin 3000 unit bolus - No VDRA or ESA  Assessment/Plan: 1. Cellulitis, B lower extremities: With necrotic changes on L 2nd toe. BCx 12/12 NGTD. No osteomyelitis on imaging. Started on Vanc/Zosyn, will be transitioned to Keflex/Doxycycline on d/c. Vascular surgery has evaluated; no plans for surgery at this time. 2.  ESRD: Continue HD per M, F schedule for now, next due 12/15. Cr remains low (2.5), but still rising between HD sessions and he has low muscle mass. Once stable, could consider trending labs off HD. K 3.1, will use 3K bath with HD since only dialyzing 2x/weekly. 3.  Hypertension/volume: BP controlled. He is below is outpt EDW currently. Low UF goal with next HD. 4.  Anemia: Hgb 11.1. No ESA needed for now. 5.  Metabolic bone disease: Ca 8.6, Phos 4.9. No  VDRA needed for now. 6.  Nutrition: Alb 2.2.  7. Hx Kidney Transplant (2005) with residual function: Continue prednisone/Prograf. 8. DM: Continue insulin. Per primary 9. Atrial fibrillation: On warfarin. Per primary. 10. Hypothyroidism: Per primary. 11. Dispo: Back to SNF after HD today.  Veneta Penton, PA-C 03/20/2016, 11:36 AM  [facility] Pager: [phone]  Pt seen, examined and agree w A/P as above.  Kelly Splinter MD [employer] pager [phone]   03/20/2016, 1:42 PM

## 2016-03-20 NOTE — Progress Notes (Signed)
Called report to [facility] @ [facility] [phone]

## 2016-03-20 NOTE — Care Management Note (Signed)
Case Management Note  Patient Details  Name: Mark [name] MRN: [id number] Date of Birth: [birth year]  Subjective/Objective:       CM following for progression and d/c planning.              Action/Plan: 03/20/2014 Pt is resident of [facility] in [place], [place]. No HH or DME needs. Plan is to return to that facility with assistance of CSW, V [place].   Expected Discharge Date:    03/20/2016              Expected Discharge Plan:  [place]  In-House Referral:  Clinical Social Work  Discharge planning Services  NA  Post Acute Care Choice:  NA Choice offered to:  NA  DME Arranged:   NA DME Agency:   NA  HH Arranged:   NA HH Agency:   NA  Status of Service:  Completed, signed off  If discussed at [employer] of Stay Meetings, dates discussed:    Additional Comments:  Adron Bene, RN 03/20/2016, 2:06 PM

## 2016-03-20 NOTE — Progress Notes (Addendum)
[facility] for warfarin Indication: atrial fibrillation  Assessment: 76 yo male admitted with chronic foot wounds, presented with increased erythema, induration and enlargement of black eschar.  He is on warfarin prior to admission for AFib - home dose 2mg  daily, INR was 1.92 on admission. Warfarin was being held for possible surgery, but restarted 12/13 as patient is currently refusing surgery.  INR this morning 2.01, hgb 11.6, plts 232- no bleeding noted.  Goal of Therapy:  INR 2-3 Monitor platelets by anticoagulation protocol: Yes   Plan:  -Warfarin 2mg  po x1 tonight -Daily INR and CBC -Follow surgical plans, s/s bleeding -Recommend continuing home regimen of warfarin 2mg  daily on discharge   Allergies  Allergen Reactions  . Tape Other (See Comments)    SKIN WILL TEAR!!    Patient Measurements: Height: 5\' 9"  (175.3 cm) Weight: 168 lb 6.9 oz (76.4 kg) IBW/kg (Calculated) : 70.7 Heparin Dosing Weight: 73.5kg  Vital Signs: Temp: 97.6 F (36.4 C) (12/15 0444) Temp Source: Oral (12/15 0444) BP: 136/68 (12/15 0444) Pulse Rate: 88 (12/15 0444)  Labs:  Recent Labs  03/17/16 1207 03/17/16 2305 03/18/16 0847 03/20/16 0515  HGB 12.4*  --  11.1* 11.6*  HCT 38.0*  --  34.1* 35.8*  PLT 193  --  196 232  LABPROT  --  22.3* 21.5* 23.1*  INR  --  1.92 1.85 2.01  CREATININE 2.33*  --  2.55* 2.64*    Estimated Creatinine Clearance: 24.2 mL/min (by C-G formula based on SCr of 2.64 mg/dL (H)).  Belia Heman, PharmD PGY1 Pharmacy Resident [phone] (Pager) 03/20/2016 8:35 AM

## 2016-03-20 NOTE — Discharge Summary (Signed)
Physician Discharge Summary  Mark [name] [id number] DOB: 12-Dec-1940 DOA: 03/17/2016  PCP: Rochel Brome, MD  Admit date: 03/17/2016 Discharge date: 03/20/2016  Admitted From: SNF Discharge disposition: SNF   Recommendations for Outpatient Follow-Up:   1. Recommend close F/U of PT/INR and adjustment of coumadin PRN to maintain therapeutic dosing. 2. The patient NEEDS to follow up at the wound care center.  Please make follow up appointment. 3. F/U final blood culture results.   Discharge Diagnosis:   Principal Problem:    Cellulitis of lower extremity Active Problems:    Gout    Diabetes mellitus with complication (HCC)    Cellulitis    Dry gangrene (HCC)    Chronic a-fib (HCC)    ESRD on dialysis (HCC)    Chronic diastolic congestive heart failure (HCC)    BPH (benign prostatic hyperplasia)    Pressure injury of skin    MRSA carrier  Discharge Condition: Improved.  Diet recommendation: Low sodium, heart healthy.  Carbohydrate-modified.   Wound care: Keep feet clean and dry.   History of Present Illness:   Mark [name] is an 75 y.o. male with a PMH of chronic foot wounds, diabetes, neuropathy, hypertension, gout and hypothyroidism, ESRD on dialysis in atrial fibrillation who was admitted 03/17/16 with a chief complaint of worsening foot wounds. He was seen by vascular surgery on admission and per their notes: He underwent angiography on 11/05/2015 and had successful atherectomy and drug coated balloon angioplasty of a left popliteal stenosis and angioplasty of a left anterior tibial artery that was occluded. 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 He was referred to wound care and was scheduled for f/u back in Oct. 2017, but failed to follow-up.   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 Empirically treated with vancomycin and Zosyn. ABIs unable to be calculated due to vessel noncompressibility. MRI of the left and right foot done 03/17/16 and personally reviewed, not indicative of osteomyelitis.Blood cultures negative to date. D/C on 5 days of Keflex/Doxycycline. Would also apply lamisil cream to feet to address fungus.  Active Problems:   MRSA carrier Contact precautions. Decontamination therapy provided.    Gout Continue allopurinol.    Diabetes mellitus with complication ([place]) Resume pre-hospital regimen with close F/U to ensure good glycemic control.     Chronic a-fib (HCC) Continue Coumadin per pharmacy dosing. Continue metoprolol for rate control.    ESRD on dialysis (HCC)/history of kidney transplant Nephrology consulted for continuation of HD. Continue immunosuppressive with Prograf/prednisone. Resume outpatient HD schedule.    Chronic diastolic congestive heart failure (HCC) No signs of decompensation.    BPH (benign prostatic hyperplasia) Continue Flomax.    Pressure injury of skin Skin care per nursing. Heel protectors ordered.    Hypothyroidism Continue Synthroid.    Hyperlipidemia Continue Crestor.    GERD Continue Protonix.    Hypokalemia Will avoid placing on supplementation given end-stage renal disease.    Medical Consultants:    Vascular Surgery  Nephrology   Discharge Exam:   Vitals:   03/20/16 0444 03/20/16 0951  BP: 136/68 (!) 146/79  Pulse: 88 (!) 106  Resp: 18 18  Temp: 97.6 F (36.4 C) 97.5 F (36.4 C)  Vitals:   03/19/16 1825 03/19/16 2149 03/20/16 0444 03/20/16 0951  BP: 135/77 129/63 136/68 (!) 146/79  Pulse: 89 84 88 (!) 106  Resp: 18 18 18 18   Temp: 97.2 F (36.2 C) 97.6 F (36.4 C) 97.6 F (36.4 C) 97.5 F (36.4 C)  TempSrc: Oral  Oral Oral Oral  SpO2: 99% 100% 99% 100%  Weight:  76.4 kg (168 lb 6.9 oz)    Height:       General exam: Appears calm and comfortable.  Respiratory system: Clear to auscultation. Respiratory effort normal. Cardiovascular system: HSIR. No JVD,  rubs, gallops or clicks. No murmurs. Gastrointestinal system: Abdomen is nondistended, soft and nontender. No organomegaly or masses felt. Normal bowel sounds heard. Central nervous system: Alert and oriented x 2. No focal neurological deficits. Extremities/Skin: Left great toe amputated with eschar medially and erythema at amputation site, 2nd left toe erythematous. Right great toe erythematous with scab dorsal, erythema and venous stasis dermatitis right lower leg. Scattered petechiae to the upper extremities. Pale skin. Wounds as documented below. Pictures taken today.  Decreasing erythema. Psychiatry: Judgement and insight appear normal. Mood & affect appropriate.        The results of significant diagnostics from this hospitalization (including imaging, microbiology, ancillary and laboratory) are listed below for reference.     Procedures and Diagnostic Studies:   Mr Foot Right Wo Contrast  Result Date: 03/18/2016 CLINICAL DATA:  Chronic soft tissue wounds of the right foot. Pain. New areas of erythema, induration and black eschar. EXAM: MRI OF THE RIGHT FOOT WITHOUT CONTRAST TECHNIQUE: Multiplanar, multisequence MR imaging of the right foot was performed. No intravenous contrast was administered. COMPARISON:  Radiographs dated 03/17/2016 FINDINGS: Bones/Joint/Cartilage There is slight arthritis at the first MTP joint as well as at the IP joint of the great toe with a small effusion at the IP joint of the great toe and slight edema in the adjacent portions of the proximal and distal phalanges. No bone destruction. No other significant bone abnormality. Muscles and Tendons Fatty atrophy of the muscles of the foot consistent neuropathy. Soft  tissues No soft tissue abscesses.  No discrete soft tissue ulcer is. IMPRESSION: Arthritic changes of the IP joint of the great toe and at the first MTP joint. No findings of osteomyelitis or soft tissue abscess. Electronically Signed   By: Lorriane Shire M.D.   On: 03/18/2016 09:36   Mr Foot Left Wo Contrast  Result Date: 03/18/2016 CLINICAL DATA:  Chronic progressive foot wounds with new areas of erythema, induration and black eschar. Previous amputation of the great toe. EXAM: MRI OF THE LEFT FOOT WITHOUT CONTRAST TECHNIQUE: Multiplanar, multisequence MR imaging of the left foot was performed. No intravenous contrast was administered. COMPARISON:  Radiographs dated 03/17/2016 and 10/28/2015 FINDINGS: Bones/Joint/Cartilage The great toe is been amputated. The distal aspect of the first metatarsal has been debrided but there is no evidence suggestive of osteomyelitis of stump of the head of the first metatarsal. There is increased signal intensity from the distal phalanx of the second toe on T2 weighted imaging but there is no bone destruction. The other bones demonstrate no significant abnormalities. Ligaments Intact. Muscles and Tendons Fatty atrophy of the muscles consistent with the patient's history of peripheral neuropathy. Soft tissues Soft tissue ulceration adjacent to the stump of the head of the first metatarsal. Soft tissue loss at the tip of the second toe. IMPRESSION: 1. No findings consistent with osteomyelitis. Previous debridement of the distal first metatarsal accounts  for the bone irregularity on radiographs. 2. Abnormal signal from the distal phalanx of the second toe is consistent with inflammation but not diagnostic for osteomyelitis. 3. No soft tissue abscesses. Electronically Signed   By: Lorriane Shire M.D.   On: 03/18/2016 09:32   Dg Foot Complete Left  Result Date: 03/17/2016 CLINICAL DATA:  Left foot pain and infection. History of first toe amputation. EXAM: LEFT FOOT - COMPLETE  3+ VIEW COMPARISON:  10/28/2015 FINDINGS: Status post first toe amputation with resection of the first distal and proximal phalanges. There is some bony irregularity involving the distal aspect of the first metatarsal. Component of osteomyelitis at this level cannot be excluded and correlation suggested with any clinical evidence of soft tissue infection at the level of the base of prior amputation. Moderate degenerative disease remains present throughout the foot as well as extensive diabetic vascular calcifications. No acute fractures identified. IMPRESSION: Status post first toe amputation with resection of distal and proximal phalanges. There is some bony irregularity involving the distal aspect of the first metatarsal and component of osteomyelitis cannot be excluded. Electronically Signed   By: Aletta Edouard M.D.   On: 03/17/2016 13:27   Dg Toe Great Right  Result Date: 03/17/2016 CLINICAL DATA:  Pain and swelling and 8 right great toe. EXAM: RIGHT GREAT TOE COMPARISON:  None. FINDINGS: Degenerative changes involving the DIP and PIP joints of the great toe but no destructive bony changes to suggest osteomyelitis. Extensive small vessel calcifications are noted. IMPRESSION: No definite plain film findings for osteomyelitis. Electronically Signed   By: Marijo Sanes M.D.   On: 03/17/2016 13:21     Labs:   Basic Metabolic Panel:  Recent Labs Lab 03/17/16 1207 03/18/16 0847 03/20/16 0515  NA 134* 136 137  K 3.3* 3.1* 4.0  CL 99* 103 101  CO2 23 25 28   GLUCOSE 261* 190* 150*  BUN 20 28* 36*  CREATININE 2.33* 2.55* 2.64*  CALCIUM 8.6* 8.6* 8.7*  PHOS  --   --  4.9*   GFR Estimated Creatinine Clearance: 24.2 mL/min (by C-G formula based on SCr of 2.64 mg/dL (H)). Liver Function Tests:  Recent Labs Lab 03/18/16 0847 03/20/16 0515  AST 19  --   ALT 11*  --   ALKPHOS 75  --   BILITOT 1.3*  --   PROT 5.3*  --   ALBUMIN 2.2* 2.0*   Coagulation profile  Recent Labs Lab  03/17/16 2305 03/18/16 0847 03/20/16 0515  INR 1.92 1.85 2.01    CBC:  Recent Labs Lab 03/17/16 1207 03/18/16 0847 03/20/16 0515  WBC 6.0 5.3 6.2  NEUTROABS 3.9  --   --   HGB 12.4* 11.1* 11.6*  HCT 38.0* 34.1* 35.8*  MCV 91.3 91.4 92.5  PLT 193 196 232   CBG:  Recent Labs Lab 03/19/16 0818 03/19/16 1208 03/19/16 1740 03/19/16 2129 03/20/16 0752  GLUCAP 222* 98 181* 90 166*   Microbiology Recent Results (from the past 240 hour(s))  Blood culture (routine x 2)     Status: None (Preliminary result)   Collection Time: 03/17/16 12:05 PM  Result Value Ref Range Status   Specimen Description BLOOD RIGHT FOREARM  Final   Special Requests BOTTLES DRAWN AEROBIC AND ANAEROBIC 5ML  Final   Culture NO GROWTH 2 DAYS  Final   Report Status PENDING  Incomplete  Blood culture (routine x 2)     Status: None (Preliminary result)   Collection Time: 03/17/16 12:22 PM  Result  Value Ref Range Status   Specimen Description BLOOD RIGHT ANTECUBITAL  Final   Special Requests BOTTLES DRAWN AEROBIC AND ANAEROBIC 5CC  Final   Culture NO GROWTH 2 DAYS  Final   Report Status PENDING  Incomplete  MRSA PCR Screening     Status: Abnormal   Collection Time: 03/17/16 10:31 PM  Result Value Ref Range Status   MRSA by PCR POSITIVE (A) NEGATIVE Final    Comment:        The GeneXpert MRSA Assay (FDA approved for NASAL specimens only), is one component of a comprehensive MRSA colonization surveillance program. It is not intended to diagnose MRSA infection nor to guide or monitor treatment for MRSA infections. RESULT CALLED TO, READ BACK BY AND VERIFIED WITHBerlinda Last RN [phone] 03/18/16 A BROWNING      Discharge Instructions:   Discharge Instructions    Call MD for:  extreme fatigue    Complete by:  As directed    Call MD for:  temperature >100.4    Complete by:  As directed    Diet - low sodium heart healthy    Complete by:  As directed    Diet Carb Modified    Complete by:  As  directed    Increase activity slowly    Complete by:  As directed      Allergies as of 03/20/2016      Reactions   Tape Other (See Comments)   SKIN WILL TEAR!!      Medication List    STOP taking these medications   fluconazole 150 MG tablet Commonly known as:  DIFLUCAN     TAKE these medications   acetaminophen 500 MG tablet Commonly known as:  TYLENOL Take 500 mg by mouth every 6 (six) hours as needed for mild pain or moderate pain.   allopurinol 300 MG tablet Commonly known as:  ZYLOPRIM Take 150 mg by mouth in the morning   cephALEXin 500 MG capsule Commonly known as:  KEFLEX Take 1 capsule (500 mg total) by mouth 3 (three) times daily.   doxycycline 100 MG capsule Commonly known as:  VIBRAMYCIN Take 1 capsule (100 mg total) by mouth 2 (two) times daily.   feeding supplement Liqd Take 1 Container by mouth 3 (three) times daily with meals.   insulin aspart 100 UNIT/ML injection Commonly known as:  novoLOG Inject 0-12 Units into the skin 3 (three) times daily before meals.   insulin glargine 100 UNIT/ML injection Commonly known as:  LANTUS Inject 0.05 mLs (5 Units total) into the skin at bedtime.   ketoconazole 2 % shampoo Commonly known as:  NIZORAL Apply 1 application topically 2 (two) times a week.   levalbuterol 1.25 MG/0.5ML nebulizer solution Commonly known as:  XOPENEX Take 1.25 mg by nebulization every 6 (six) hours as needed for wheezing or shortness of breath.   levothyroxine 125 MCG tablet Commonly known as:  SYNTHROID, LEVOTHROID Take 1 tablet (125 mcg total) by mouth daily before breakfast.   metoprolol succinate 50 MG 24 hr tablet Commonly known as:  TOPROL-XL Take 50 mg by mouth daily.   multivitamin Tabs tablet Take 1 tablet by mouth at bedtime.   nystatin powder Generic drug:  nystatin Apply 1 g topically daily as needed (irritation).   OLANZapine 5 MG tablet Commonly known as:  ZYPREXA Take 5 mg by mouth at bedtime.     pantoprazole 40 MG tablet Commonly known as:  PROTONIX Take 1 tablet (40 mg total) by  mouth daily. What changed:  when to take this   polyethylene glycol packet Commonly known as:  MIRALAX / GLYCOLAX Take 17 g by mouth daily. Start taking on:  03/21/2016   predniSONE 5 MG tablet Commonly known as:  DELTASONE Take 5 mg by mouth daily.   PROGRAF 0.5 MG capsule Generic drug:  tacrolimus Take 0.5 mg by mouth 2 (two) times daily.   rosuvastatin 5 MG tablet Commonly known as:  CRESTOR Take 5 mg by mouth every evening.   tamsulosin 0.4 MG Caps capsule Commonly known as:  FLOMAX Take 1 capsule (0.4 mg total) by mouth daily after supper.   terbinafine 1 % cream Commonly known as:  LAMISIL AT Apply 1 application topically 2 (two) times daily. Bilateral feet   vitamin B-12 100 MCG tablet Commonly known as:  CYANOCOBALAMIN Take 100 mcg by mouth daily.   warfarin 2 MG tablet Commonly known as:  COUMADIN Take 2 mg by mouth at bedtime. What changed:  Another medication with the same name was removed. Continue taking this medication, and follow the directions you see here.         Time coordinating discharge: > 35 minutes.  Signed:  Rhett Najera  Pager [phone] [facility] 03/20/2016, 11:03 AM

## 2016-03-20 NOTE — Care Management Important Message (Signed)
Important Message  Patient Details  Name: SATYAM CIZEK MRN: [id number] Date of Birth: 11-18-1940   Medicare Important Message Given:  Yes    Daaron Dimarco, Rory Percy, RN 03/20/2016, 2:14 PM

## 2016-03-22 DIAGNOSIS — L02419 Cutaneous abscess of limb, unspecified: Secondary | ICD-10-CM | POA: Diagnosis not present

## 2016-03-22 DIAGNOSIS — I4891 Unspecified atrial fibrillation: Secondary | ICD-10-CM | POA: Diagnosis not present

## 2016-03-22 DIAGNOSIS — N186 End stage renal disease: Secondary | ICD-10-CM | POA: Diagnosis not present

## 2016-03-22 DIAGNOSIS — E43 Unspecified severe protein-calorie malnutrition: Secondary | ICD-10-CM | POA: Diagnosis not present

## 2016-03-22 LAB — CULTURE, BLOOD (ROUTINE X 2)
Culture: NO GROWTH
Culture: NO GROWTH

## 2016-03-24 DIAGNOSIS — I70209 Unspecified atherosclerosis of native arteries of extremities, unspecified extremity: Secondary | ICD-10-CM | POA: Diagnosis not present

## 2016-03-24 DIAGNOSIS — L89152 Pressure ulcer of sacral region, stage 2: Secondary | ICD-10-CM | POA: Diagnosis not present

## 2016-03-24 DIAGNOSIS — L97522 Non-pressure chronic ulcer of other part of left foot with fat layer exposed: Secondary | ICD-10-CM | POA: Diagnosis not present

## 2016-03-24 DIAGNOSIS — L97512 Non-pressure chronic ulcer of other part of right foot with fat layer exposed: Secondary | ICD-10-CM | POA: Diagnosis not present

## 2016-03-24 DIAGNOSIS — T8189XD Other complications of procedures, not elsewhere classified, subsequent encounter: Secondary | ICD-10-CM | POA: Diagnosis not present

## 2016-03-25 DIAGNOSIS — D631 Anemia in chronic kidney disease: Secondary | ICD-10-CM | POA: Diagnosis not present

## 2016-03-25 DIAGNOSIS — N186 End stage renal disease: Secondary | ICD-10-CM | POA: Diagnosis not present

## 2016-03-27 ENCOUNTER — Encounter: Payer: Self-pay | Admitting: Family

## 2016-03-27 DIAGNOSIS — N186 End stage renal disease: Secondary | ICD-10-CM | POA: Diagnosis not present

## 2016-03-27 DIAGNOSIS — D631 Anemia in chronic kidney disease: Secondary | ICD-10-CM | POA: Diagnosis not present

## 2016-03-30 DIAGNOSIS — Z4781 Encounter for orthopedic aftercare following surgical amputation: Secondary | ICD-10-CM | POA: Diagnosis not present

## 2016-03-30 DIAGNOSIS — I132 Hypertensive heart and chronic kidney disease with heart failure and with stage 5 chronic kidney disease, or end stage renal disease: Secondary | ICD-10-CM | POA: Diagnosis not present

## 2016-03-30 DIAGNOSIS — E11621 Type 2 diabetes mellitus with foot ulcer: Secondary | ICD-10-CM | POA: Diagnosis not present

## 2016-03-30 DIAGNOSIS — I5020 Unspecified systolic (congestive) heart failure: Secondary | ICD-10-CM | POA: Diagnosis not present

## 2016-03-30 DIAGNOSIS — Z89412 Acquired absence of left great toe: Secondary | ICD-10-CM | POA: Diagnosis not present

## 2016-03-30 DIAGNOSIS — L97512 Non-pressure chronic ulcer of other part of right foot with fat layer exposed: Secondary | ICD-10-CM | POA: Diagnosis not present

## 2016-03-31 DIAGNOSIS — I5020 Unspecified systolic (congestive) heart failure: Secondary | ICD-10-CM | POA: Diagnosis not present

## 2016-03-31 DIAGNOSIS — Z89412 Acquired absence of left great toe: Secondary | ICD-10-CM | POA: Diagnosis not present

## 2016-03-31 DIAGNOSIS — E11621 Type 2 diabetes mellitus with foot ulcer: Secondary | ICD-10-CM | POA: Diagnosis not present

## 2016-03-31 DIAGNOSIS — Z4781 Encounter for orthopedic aftercare following surgical amputation: Secondary | ICD-10-CM | POA: Diagnosis not present

## 2016-03-31 DIAGNOSIS — I132 Hypertensive heart and chronic kidney disease with heart failure and with stage 5 chronic kidney disease, or end stage renal disease: Secondary | ICD-10-CM | POA: Diagnosis not present

## 2016-03-31 DIAGNOSIS — L97512 Non-pressure chronic ulcer of other part of right foot with fat layer exposed: Secondary | ICD-10-CM | POA: Diagnosis not present

## 2016-04-02 DIAGNOSIS — Z4781 Encounter for orthopedic aftercare following surgical amputation: Secondary | ICD-10-CM | POA: Diagnosis not present

## 2016-04-02 DIAGNOSIS — E876 Hypokalemia: Secondary | ICD-10-CM | POA: Diagnosis not present

## 2016-04-02 DIAGNOSIS — L97512 Non-pressure chronic ulcer of other part of right foot with fat layer exposed: Secondary | ICD-10-CM | POA: Diagnosis not present

## 2016-04-02 DIAGNOSIS — Z89412 Acquired absence of left great toe: Secondary | ICD-10-CM | POA: Diagnosis not present

## 2016-04-02 DIAGNOSIS — E11621 Type 2 diabetes mellitus with foot ulcer: Secondary | ICD-10-CM | POA: Diagnosis not present

## 2016-04-02 DIAGNOSIS — I5020 Unspecified systolic (congestive) heart failure: Secondary | ICD-10-CM | POA: Diagnosis not present

## 2016-04-02 DIAGNOSIS — I132 Hypertensive heart and chronic kidney disease with heart failure and with stage 5 chronic kidney disease, or end stage renal disease: Secondary | ICD-10-CM | POA: Diagnosis not present

## 2016-04-02 DIAGNOSIS — N186 End stage renal disease: Secondary | ICD-10-CM | POA: Diagnosis not present

## 2016-04-03 DIAGNOSIS — E11621 Type 2 diabetes mellitus with foot ulcer: Secondary | ICD-10-CM | POA: Diagnosis not present

## 2016-04-03 DIAGNOSIS — Z4781 Encounter for orthopedic aftercare following surgical amputation: Secondary | ICD-10-CM | POA: Diagnosis not present

## 2016-04-03 DIAGNOSIS — Z89412 Acquired absence of left great toe: Secondary | ICD-10-CM | POA: Diagnosis not present

## 2016-04-03 DIAGNOSIS — I5020 Unspecified systolic (congestive) heart failure: Secondary | ICD-10-CM | POA: Diagnosis not present

## 2016-04-03 DIAGNOSIS — I132 Hypertensive heart and chronic kidney disease with heart failure and with stage 5 chronic kidney disease, or end stage renal disease: Secondary | ICD-10-CM | POA: Diagnosis not present

## 2016-04-03 DIAGNOSIS — L97512 Non-pressure chronic ulcer of other part of right foot with fat layer exposed: Secondary | ICD-10-CM | POA: Diagnosis not present

## 2016-04-04 DIAGNOSIS — N186 End stage renal disease: Secondary | ICD-10-CM | POA: Diagnosis not present

## 2016-04-04 DIAGNOSIS — E876 Hypokalemia: Secondary | ICD-10-CM | POA: Diagnosis not present

## 2016-04-05 DIAGNOSIS — E1129 Type 2 diabetes mellitus with other diabetic kidney complication: Secondary | ICD-10-CM | POA: Diagnosis not present

## 2016-04-05 DIAGNOSIS — N186 End stage renal disease: Secondary | ICD-10-CM | POA: Diagnosis not present

## 2016-04-05 DIAGNOSIS — Z992 Dependence on renal dialysis: Secondary | ICD-10-CM | POA: Diagnosis not present

## 2016-04-07 DIAGNOSIS — Z4781 Encounter for orthopedic aftercare following surgical amputation: Secondary | ICD-10-CM | POA: Diagnosis not present

## 2016-04-07 DIAGNOSIS — I5020 Unspecified systolic (congestive) heart failure: Secondary | ICD-10-CM | POA: Diagnosis not present

## 2016-04-07 DIAGNOSIS — Z89412 Acquired absence of left great toe: Secondary | ICD-10-CM | POA: Diagnosis not present

## 2016-04-07 DIAGNOSIS — I132 Hypertensive heart and chronic kidney disease with heart failure and with stage 5 chronic kidney disease, or end stage renal disease: Secondary | ICD-10-CM | POA: Diagnosis not present

## 2016-04-07 DIAGNOSIS — L97512 Non-pressure chronic ulcer of other part of right foot with fat layer exposed: Secondary | ICD-10-CM | POA: Diagnosis not present

## 2016-04-07 DIAGNOSIS — E11621 Type 2 diabetes mellitus with foot ulcer: Secondary | ICD-10-CM | POA: Diagnosis not present

## 2016-04-09 DIAGNOSIS — E876 Hypokalemia: Secondary | ICD-10-CM | POA: Diagnosis not present

## 2016-04-09 DIAGNOSIS — N170 Acute kidney failure with tubular necrosis: Secondary | ICD-10-CM | POA: Diagnosis not present

## 2016-04-10 ENCOUNTER — Other Ambulatory Visit: Payer: Self-pay | Admitting: Endocrinology

## 2016-04-10 DIAGNOSIS — L97512 Non-pressure chronic ulcer of other part of right foot with fat layer exposed: Secondary | ICD-10-CM | POA: Diagnosis not present

## 2016-04-10 DIAGNOSIS — Z89412 Acquired absence of left great toe: Secondary | ICD-10-CM | POA: Diagnosis not present

## 2016-04-10 DIAGNOSIS — Z4781 Encounter for orthopedic aftercare following surgical amputation: Secondary | ICD-10-CM | POA: Diagnosis not present

## 2016-04-10 DIAGNOSIS — E11621 Type 2 diabetes mellitus with foot ulcer: Secondary | ICD-10-CM | POA: Diagnosis not present

## 2016-04-10 DIAGNOSIS — N186 End stage renal disease: Secondary | ICD-10-CM | POA: Diagnosis not present

## 2016-04-10 DIAGNOSIS — E1122 Type 2 diabetes mellitus with diabetic chronic kidney disease: Secondary | ICD-10-CM | POA: Diagnosis not present

## 2016-04-10 DIAGNOSIS — I5020 Unspecified systolic (congestive) heart failure: Secondary | ICD-10-CM | POA: Diagnosis not present

## 2016-04-10 DIAGNOSIS — I132 Hypertensive heart and chronic kidney disease with heart failure and with stage 5 chronic kidney disease, or end stage renal disease: Secondary | ICD-10-CM | POA: Diagnosis not present

## 2016-04-10 MED ORDER — LEVOTHYROXINE SODIUM 125 MCG PO TABS: 125.0000 ug | ORAL_TABLET | Freq: Every day | ORAL | 0 refills | Status: DC

## 2016-04-10 NOTE — Telephone Encounter (Signed)
Thyroid, prednisone, crestor, metoprolol, renovite, and generic for flomax need to be called in to [facility], the pt is not a PC of Dr. Dwyane Dee but pt is out and [facility] closed at 17 today please advise

## 2016-04-13 ENCOUNTER — Ambulatory Visit: Payer: Medicare Other | Admitting: Family

## 2016-04-13 ENCOUNTER — Ambulatory Visit: Payer: Medicare Other | Admitting: Surgery

## 2016-04-13 DIAGNOSIS — Z89412 Acquired absence of left great toe: Secondary | ICD-10-CM | POA: Diagnosis not present

## 2016-04-13 DIAGNOSIS — I5020 Unspecified systolic (congestive) heart failure: Secondary | ICD-10-CM | POA: Diagnosis not present

## 2016-04-13 DIAGNOSIS — N170 Acute kidney failure with tubular necrosis: Secondary | ICD-10-CM | POA: Diagnosis not present

## 2016-04-13 DIAGNOSIS — I132 Hypertensive heart and chronic kidney disease with heart failure and with stage 5 chronic kidney disease, or end stage renal disease: Secondary | ICD-10-CM | POA: Diagnosis not present

## 2016-04-13 DIAGNOSIS — Z4781 Encounter for orthopedic aftercare following surgical amputation: Secondary | ICD-10-CM | POA: Diagnosis not present

## 2016-04-13 DIAGNOSIS — E876 Hypokalemia: Secondary | ICD-10-CM | POA: Diagnosis not present

## 2016-04-13 DIAGNOSIS — L97512 Non-pressure chronic ulcer of other part of right foot with fat layer exposed: Secondary | ICD-10-CM | POA: Diagnosis not present

## 2016-04-13 DIAGNOSIS — E11621 Type 2 diabetes mellitus with foot ulcer: Secondary | ICD-10-CM | POA: Diagnosis not present

## 2016-04-14 ENCOUNTER — Ambulatory Visit: Payer: Medicare Other | Admitting: Family

## 2016-04-15 ENCOUNTER — Encounter: Payer: Self-pay | Admitting: Family

## 2016-04-15 ENCOUNTER — Ambulatory Visit (INDEPENDENT_AMBULATORY_CARE_PROVIDER_SITE_OTHER): Payer: Medicare Other | Admitting: Family

## 2016-04-15 VITALS — BP 147/77 | HR 59 | Temp 96.6°F | Resp 14 | Ht 69.0 in | Wt 164.0 lb

## 2016-04-15 DIAGNOSIS — I770 Arteriovenous fistula, acquired: Secondary | ICD-10-CM

## 2016-04-15 DIAGNOSIS — N186 End stage renal disease: Secondary | ICD-10-CM | POA: Diagnosis not present

## 2016-04-15 DIAGNOSIS — Z992 Dependence on renal dialysis: Secondary | ICD-10-CM

## 2016-04-15 DIAGNOSIS — I7025 Atherosclerosis of native arteries of other extremities with ulceration: Secondary | ICD-10-CM

## 2016-04-15 DIAGNOSIS — Z89412 Acquired absence of left great toe: Secondary | ICD-10-CM

## 2016-04-15 NOTE — Progress Notes (Signed)
[facility] [place]   CC: Follow up peripheral artery occlusive disease  History of Present Illness Mark [name] is a 76 y.o. male who is s/p Right IJ tunneled dialysis catheter placement with US guidance and leftbrachiocephalic arteriovenous fistula placement on 11/19/15 by Dr. Donzetta Matters for ESRD, need for HD. Patient is s/p successful atherectomy and drug coated balloon angioplasty of a left popliteal stenosis and angioplasty of a left anterior tibial artery that was occluded on 11-05-15. He then subsequently went on to have amputation of his left great toe including the metatarsal head on 11-06-15 by Dr. Oneida Alar for osteomyelitis left first toe. He was referred to wound care and was scheduled for f/u back in Oct. 2017, but failed to follow-up.  It is unclear why he returns today, possibly for wound evaluation of his feet.  He was discharged from [facility] and is at his home.   The left arm AVF incision is well healed, AVF has a palpable thrill and audible bruit. The patient notes no steal symptoms in his left hand/forearm.    Dr. Trula Slade last evaluated pt on 12-30-15. At that time pedal pulses were not palpable Left great toe amputation site remained open.  There appeared to be a granulation tissue base forming. -Right great toe with dry eschar anteriorly no erythema -Right medial calf with open wound and mild edema Fistula: The patient's fistula was maturing nicely.  It should be ready for use an approximate 6 weeks. For bilateral lower extremity ulcers: Dr. Trula Slade recommended placing Santyl on the left great toe amputation site and Silvadene on the right great toe amputation site.  He will use Xeroform and a Ace wrap to help with the ulcer on the right leg. Dr. Trula Slade referred pt  to the wound center.  Based on his vascular lab studies, he has appropriate blood flow to heal his wounds. Dr. Trula Slade scheduled pt to follow-up in 6 weeks with a repeat duplex to  evaluate his intervention.  Non invasive vascular labs were then performed on 03-18-16 and 03-19-16 during a hospitalization from [facility] ED for cellulitis. Discharge Summary on 03-20-16 from that [facility] hospitalization includes the following:  admitted 03/17/16 with a chief complaint of worsening foot wounds. He was seen by vascular surgery on admission and per their notes: He underwent angiography on 11/05/2015 and had successful atherectomy and drug coated balloon angioplasty of a left popliteal stenosis and angioplasty of a left anterior tibial artery that was occluded. 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 He was referred to wound care and was scheduled for f/u back in Oct. 2017, but failed to follow-up. 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 Empirically treated with vancomycin and Zosyn. ABIs unable to be calculated due to vessel noncompressibility. MRI of the left and right foot done 03/17/16 and personally reviewed, not indicative of osteomyelitis.Blood cultures negative to date. D/C on 5 days of Keflex/Doxycycline. Would also apply lamisil cream to feet to address fungus. 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 Decontamination therapy provided.   Sitter with pt, who is now home, states pt dialyzes only on Mondays and Fridays now via right upper chest catheter, left arm AV fistula has never been used, and the kidney center plans to do some  more testing in 2 days to see of his HD catheter can be removed since it seems his transplanted kidney seems to be working somewhat. His last serum creatinine result on file was 2.64 on 03-20-16, was 2.33 on 03-17-16, was 1.09 on 02-16-16.  Pt states HH changes  bilateral feet dressings twice/week, and pt had been attending wound care clinic in [place]. He wears a post op shoe on his left foot.   He does not seem to walk enough to elicit claudication symptoms.   Pt Diabetic: Yes, 6.7 A1C on 02-12-16 (review of records) Pt smoker: non-smoker  Pt meds include: Statin :Yes Betablocker: Yes ASA: No Other anticoagulants/antiplatelets: coumadin, has a hx of atrial fib   Past Medical History:  Diagnosis Date  . Acute pancreatitis 02/12/2016  . Atrial fibrillation ([place])   . Atrial fibrillation with RVR ([place]) 02/12/2016  . CHF (congestive heart failure) ([place])   . Diabetes ([place])   . Diabetic neuropathy ([place])   . Gout   . Hypertension   . Hypothyroidism   . 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 Renal disorder    kidney transplant   . Ulcer of other part of foot 12/15/2013    Social History Social History  Substance Use Topics  . Smoking status: Never Smoker  . Smokeless tobacco: Never Used  . Alcohol use No    Family History Family History  Problem Relation Age of Onset  . Bradycardia Mother   . Stroke Father     Past Surgical History:  Procedure Laterality Date  . AMPUTATION Left 11/06/2015   Procedure: AMPUTATION LEFT GREAT TOE;  Surgeon: Elam Dutch, MD;  Location: [place];  Service: Vascular;  Laterality: Left;  . ANKLE FRACTURE SURGERY    . AV FISTULA PLACEMENT Left 11/19/2015   Procedure: LEFT ARM ARTERIOVENOUS (AV) FISTULA CREATION;  Surgeon: Waynetta Sandy, MD;  Location: [place];  Service: Vascular;  Laterality: Left;  . CATARACT EXTRACTION    . INSERTION OF DIALYSIS CATHETER Right 11/19/2015   Procedure: INSERTION OF DIALYSIS CATHETER RIGHT INTERNAL JUGULAR;  Surgeon: Waynetta Sandy, MD;  Location: [place];  Service: Vascular;  Laterality: Right;  . IR GENERIC HISTORICAL  11/14/2015   IR FLUORO GUIDE CV LINE RIGHT 11/14/2015 Markus Daft, MD [facility]INTER[facility]  . IR GENERIC HISTORICAL  11/14/2015   IR US GUIDE VASC ACCESS  RIGHT 11/14/2015 Markus Daft, MD [facility]  . KIDNEY TRANSPLANT  2012  . MELANOMA EXCISION    . NEPHRECTOMY TRANSPLANTED ORGAN    . PERIPHERAL VASCULAR CATHETERIZATION N/A 11/05/2015   Procedure: Abdominal Aortogram w/Lower Extremity;  Surgeon: Serafina Mitchell, MD;  Location: [place] [facility];  Service: Cardiovascular;  Laterality: N/A;  . PERIPHERAL VASCULAR CATHETERIZATION Left 11/05/2015   Procedure: Peripheral Vascular Atherectomy;  Surgeon: Serafina Mitchell, MD;  Location: [place] [facility];  Service: Cardiovascular;  Laterality: Left;  popiteal  . PERIPHERAL VASCULAR CATHETERIZATION Left 11/05/2015   Procedure: Peripheral Vascular Balloon Angioplasty;  Surgeon: Serafina Mitchell, MD;  Location: [place] [facility];  Service: Cardiovascular;  Laterality: Left;  anterior tib    Allergies  Allergen Reactions  . Tape Other (See Comments)    SKIN WILL TEAR!!    Current Outpatient Prescriptions  Medication Sig Dispense Refill  . acetaminophen (TYLENOL) 500 MG tablet Take 500 mg by mouth every 6 (six) hours as needed for mild pain or moderate pain.    Marland Kitchen allopurinol (ZYLOPRIM) 300 MG tablet Take 150 mg by  mouth in the morning 15 tablet 5  . feeding supplement (BOOST / RESOURCE BREEZE) LIQD Take 1 Container by mouth 3 (three) times daily with meals.  0  . insulin aspart (NOVOLOG) 100 UNIT/ML injection Inject 0-12 Units into the skin 3 (three) times daily before meals.    . insulin glargine (LANTUS) 100 UNIT/ML injection Inject 0.05 mLs (5 Units total) into the skin at bedtime. 10 mL 11  . ketoconazole (NIZORAL) 2 % shampoo Apply 1 application topically 2 (two) times a week.     . levalbuterol (XOPENEX) 1.25 MG/0.5ML nebulizer solution Take 1.25 mg by nebulization every 6 (six) hours as needed for wheezing or shortness of breath. 1 each 12  . levothyroxine (SYNTHROID, LEVOTHROID) 125 MCG tablet Take 1 tablet (125 mcg total) by mouth daily before breakfast. 30 tablet 0  . metoprolol succinate  (TOPROL-XL) 50 MG 24 hr tablet Take 50 mg by mouth daily.     . multivitamin (RENA-VIT) TABS tablet Take 1 tablet by mouth at bedtime.  0  . nystatin (MYCOSTATIN/NYSTOP) 100000 UNIT/GM POWD Apply 1 g topically daily as needed (irritation).     . OLANZapine (ZYPREXA) 5 MG tablet Take 5 mg by mouth at bedtime.    . pantoprazole (PROTONIX) 40 MG tablet Take 1 tablet (40 mg total) by mouth daily.    . polyethylene glycol (MIRALAX / GLYCOLAX) packet Take 17 g by mouth daily. 14 each 0  . predniSONE (DELTASONE) 5 MG tablet Take 5 mg by mouth daily.  0  . rosuvastatin (CRESTOR) 5 MG tablet Take 5 mg by mouth every evening.    . tacrolimus (PROGRAF) 0.5 MG capsule Take 0.5 mg by mouth 2 (two) times daily.    . tamsulosin (FLOMAX) 0.4 MG CAPS capsule Take 1 capsule (0.4 mg total) by mouth daily after supper. 30 capsule   . terbinafine (LAMISIL AT) 1 % cream Apply 1 application topically 2 (two) times daily. Bilateral feet 30 g 0  . vitamin B-12 (CYANOCOBALAMIN) 100 MCG tablet Take 100 mcg by mouth daily.    Marland Kitchen warfarin (COUMADIN) 2 MG tablet Take 2 mg by mouth at bedtime.     No current facility-administered medications for this visit.     ROS: See HPI for pertinent positives and negatives.   Physical Examination  Vitals:   04/15/16 1336 04/15/16 1339  BP: (!) 147/75 (!) 147/77  Pulse: (!) 59 (!) 59  Resp: 14   Temp: (!) 96.6 F (35.9 C)   SpO2: 96%   Weight: 164 lb (74.4 kg)   Height: 5\' 9"  (1.753 m)    Body mass index is 24.22 kg/m.  General: A&O x 3, WDWN, elderly male Gait: seated in wheelchair Eyes: Pupils are equal Pulmonary: Respirations are non labored Cardiac: regular Rhythm, bradycardic Radial pulses: 1+ palpable and =                           VASCULAR EXAM: Extremities with ischemic changes,  without Gangrene; with open wounds:  Right great toe DIP joint dorsal aspect: 0.5 cm x 0.5 cm shallow wound oozing scant amount semi thick clear drainage. Right shin wound with  scant amount eschar and contracted, almost closed. Left foot medial aspect of first metatarsal: 2 cm x 1.5 cm shallow eschar. Tip of right 2nd toe: granulating tissue with some eschar. Left great toe amputation site is healed. Lateral aspect left foot at base of 5th toe: 1.25 cm x 2.25  cm shallow eschar.     HD catheter situated at right upper chest. Left brachiocephalic arteriovenous fistula with palpable thrill.                                                                                                           LE Pulses Right Left       FEMORAL  no palpable seated in w/c  not palpable seated in w/c        POPLITEAL  not palpable   not palpable       POSTERIOR TIBIAL  not palpable   not palpable        DORSALIS PEDIS      ANTERIOR TIBIAL not palpable  not palpable    Abdomen: soft, NT, no palpable masses. Skin: no rashes,  See Extremities.  Musculoskeletal: no muscle wasting or atrophy.  Neurologic: A&O X 3; Appropriate Affect ; SENSATION: normal; MOTOR FUNCTION:  moving all extremities equally, motor strength 3/5 throughout. Speech is fluent/normal. CN 2-12 grossly intact, is slightly hard of hearing.    Non-Invasive Vascular Imaging: Bilateral LE arterial Duplex at [facility] 03-19-16:  - Bilateral: significant calcific plaque and monophasic flow   throughout. There is an occlusion noted in the right distal   femoral artery with collateral flow and reconstitution noted in   the distal femoral. Right PTA appears to have collateral flow.   Right ATA appears patent. Left: No significant stenosis noted. - Waveforms have dramatically changed since last study done 12/19/15   as they were triphasic and are now monophasic.  ABI's 03-18-16 at [facility]: Summary: Unable to obtain bilateral ABI's due to vessel non-compressibility.  Monophasic waveforms suggest abnormal arterial flow at rest. 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 Unable to obtain TBI&'s  due to right great toe ulcer and left great toe amputation.   ASSESSMENT: Mark [name] is a 75 y.o. male who is s/p Right IJ tunneled dialysis catheter placement with US guidance and leftbrachiocephalic arteriovenous fistula placement on 11/19/15 by Dr. Donzetta Matters for ESRD, need for HD. Patient is s/p successful atherectomy and drug coated balloon angioplasty of a left popliteal stenosis and angioplasty of a left anterior tibial artery that was occluded on 11-05-15. He then subsequently went on to have amputation of his left great toe including the metatarsal head on 11-06-15 by Dr. Oneida Alar for osteomyelitis left first toe. He was referred to wound care and was scheduled for f/u back in Oct. 2017, but failed to follow-up.  Wounds of both feet seem to be improving according to pt, with history of wound care center treatment and now [facility] dressing changes to both feet twice/week  Bilateral LE arterial duplex on 03-19-16 at [facility] demonstrates significant calcific plaque and monophasic flow throughout. 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 Right PTA appears to have collateral flow. Right ATA appears patent. Left: No significant stenosis noted. - Waveforms have dramatically changed since last study done 12/19/15 as  they were triphasic and are now monophasic.   PLAN:  Continued dressing changes of both feet by HH.   Based on the patient's vascular studies and examination, pt will return to clinic 6 months after his last non invasive vascular testing (December 2017 at [facility]), which would be in 5 months, with ABI's and bilateral LE arterial duplex.  HD catheter removal according to kidney center protocol.   The patient was given information about PAD including signs, symptoms, treatment, what symptoms should prompt the patient to seek immediate medical care, and risk reduction measures to take.  Clemon Chambers, RN, MSN,  FNP-C [facility] [employer] Phone: [phone]  Clinic MD: Scot Dock  04/15/16 1:49 PM

## 2016-04-16 DIAGNOSIS — I132 Hypertensive heart and chronic kidney disease with heart failure and with stage 5 chronic kidney disease, or end stage renal disease: Secondary | ICD-10-CM | POA: Diagnosis not present

## 2016-04-16 DIAGNOSIS — I5020 Unspecified systolic (congestive) heart failure: Secondary | ICD-10-CM | POA: Diagnosis not present

## 2016-04-16 DIAGNOSIS — Z4781 Encounter for orthopedic aftercare following surgical amputation: Secondary | ICD-10-CM | POA: Diagnosis not present

## 2016-04-16 DIAGNOSIS — Z89412 Acquired absence of left great toe: Secondary | ICD-10-CM | POA: Diagnosis not present

## 2016-04-16 DIAGNOSIS — L97512 Non-pressure chronic ulcer of other part of right foot with fat layer exposed: Secondary | ICD-10-CM | POA: Diagnosis not present

## 2016-04-16 DIAGNOSIS — E11621 Type 2 diabetes mellitus with foot ulcer: Secondary | ICD-10-CM | POA: Diagnosis not present

## 2016-04-16 NOTE — Patient Instructions (Signed)

## 2016-04-17 NOTE — Addendum Note (Signed)
Addended by: Lianne Cure A on: 04/17/2016 10:20 AM   Modules accepted: Orders

## 2016-04-21 DIAGNOSIS — I132 Hypertensive heart and chronic kidney disease with heart failure and with stage 5 chronic kidney disease, or end stage renal disease: Secondary | ICD-10-CM | POA: Diagnosis not present

## 2016-04-21 DIAGNOSIS — L97512 Non-pressure chronic ulcer of other part of right foot with fat layer exposed: Secondary | ICD-10-CM | POA: Diagnosis not present

## 2016-04-21 DIAGNOSIS — I5020 Unspecified systolic (congestive) heart failure: Secondary | ICD-10-CM | POA: Diagnosis not present

## 2016-04-21 DIAGNOSIS — Z4781 Encounter for orthopedic aftercare following surgical amputation: Secondary | ICD-10-CM | POA: Diagnosis not present

## 2016-04-21 DIAGNOSIS — Z89412 Acquired absence of left great toe: Secondary | ICD-10-CM | POA: Diagnosis not present

## 2016-04-21 DIAGNOSIS — E11621 Type 2 diabetes mellitus with foot ulcer: Secondary | ICD-10-CM | POA: Diagnosis not present

## 2016-04-24 ENCOUNTER — Other Ambulatory Visit: Payer: Self-pay | Admitting: Endocrinology

## 2016-04-25 DIAGNOSIS — L97512 Non-pressure chronic ulcer of other part of right foot with fat layer exposed: Secondary | ICD-10-CM | POA: Diagnosis not present

## 2016-04-25 DIAGNOSIS — Z89412 Acquired absence of left great toe: Secondary | ICD-10-CM | POA: Diagnosis not present

## 2016-04-25 DIAGNOSIS — I132 Hypertensive heart and chronic kidney disease with heart failure and with stage 5 chronic kidney disease, or end stage renal disease: Secondary | ICD-10-CM | POA: Diagnosis not present

## 2016-04-25 DIAGNOSIS — Z4781 Encounter for orthopedic aftercare following surgical amputation: Secondary | ICD-10-CM | POA: Diagnosis not present

## 2016-04-25 DIAGNOSIS — E11621 Type 2 diabetes mellitus with foot ulcer: Secondary | ICD-10-CM | POA: Diagnosis not present

## 2016-04-25 DIAGNOSIS — I5020 Unspecified systolic (congestive) heart failure: Secondary | ICD-10-CM | POA: Diagnosis not present

## 2016-04-27 ENCOUNTER — Ambulatory Visit (INDEPENDENT_AMBULATORY_CARE_PROVIDER_SITE_OTHER): Payer: Medicare Other | Admitting: Endocrinology

## 2016-04-27 ENCOUNTER — Encounter: Payer: Self-pay | Admitting: Endocrinology

## 2016-04-27 VITALS — BP 110/70 | HR 65 | Ht 69.0 in

## 2016-04-27 DIAGNOSIS — E441 Mild protein-calorie malnutrition: Secondary | ICD-10-CM

## 2016-04-27 DIAGNOSIS — Z4781 Encounter for orthopedic aftercare following surgical amputation: Secondary | ICD-10-CM | POA: Diagnosis not present

## 2016-04-27 DIAGNOSIS — I7025 Atherosclerosis of native arteries of other extremities with ulceration: Secondary | ICD-10-CM | POA: Diagnosis not present

## 2016-04-27 DIAGNOSIS — E11621 Type 2 diabetes mellitus with foot ulcer: Secondary | ICD-10-CM | POA: Diagnosis not present

## 2016-04-27 DIAGNOSIS — L97512 Non-pressure chronic ulcer of other part of right foot with fat layer exposed: Secondary | ICD-10-CM | POA: Diagnosis not present

## 2016-04-27 DIAGNOSIS — Z794 Long term (current) use of insulin: Secondary | ICD-10-CM

## 2016-04-27 DIAGNOSIS — Z89412 Acquired absence of left great toe: Secondary | ICD-10-CM | POA: Diagnosis not present

## 2016-04-27 DIAGNOSIS — E063 Autoimmune thyroiditis: Secondary | ICD-10-CM

## 2016-04-27 DIAGNOSIS — E1165 Type 2 diabetes mellitus with hyperglycemia: Secondary | ICD-10-CM | POA: Diagnosis not present

## 2016-04-27 DIAGNOSIS — E782 Mixed hyperlipidemia: Secondary | ICD-10-CM

## 2016-04-27 DIAGNOSIS — E038 Other specified hypothyroidism: Secondary | ICD-10-CM

## 2016-04-27 DIAGNOSIS — I132 Hypertensive heart and chronic kidney disease with heart failure and with stage 5 chronic kidney disease, or end stage renal disease: Secondary | ICD-10-CM | POA: Diagnosis not present

## 2016-04-27 DIAGNOSIS — I5020 Unspecified systolic (congestive) heart failure: Secondary | ICD-10-CM | POA: Diagnosis not present

## 2016-04-27 LAB — LIPID PANEL
CHOL/HDL RATIO: 4
CHOLESTEROL: 101 mg/dL (ref 0–200)
HDL: 28.3 mg/dL — ABNORMAL LOW (ref >39.00)
LDL Cholesterol: 44 mg/dL (ref 0–99)
NonHDL: 73.19
TRIGLYCERIDES: 148 mg/dL (ref 0.0–149.0)
VLDL: 29.6 mg/dL (ref 0.0–40.0)

## 2016-04-27 LAB — COMPREHENSIVE METABOLIC PANEL
ALBUMIN: 2.8 g/dL — AB (ref 3.5–5.2)
ALT: 8 U/L (ref 0–53)
AST: 18 U/L (ref 0–37)
Alkaline Phosphatase: 49 U/L (ref 39–117)
BILIRUBIN TOTAL: 0.9 mg/dL (ref 0.2–1.2)
BUN: 42 mg/dL — ABNORMAL HIGH (ref 6–23)
CALCIUM: 9.1 mg/dL (ref 8.4–10.5)
CO2: 22 meq/L (ref 19–32)
CREATININE: 1.85 mg/dL — AB (ref 0.40–1.50)
Chloride: 106 mEq/L (ref 96–112)
GFR: 37.96 mL/min — ABNORMAL LOW (ref >60.00)
Glucose, Bld: 226 mg/dL — ABNORMAL HIGH (ref 70–99)
Potassium: 4 mEq/L (ref 3.5–5.1)
Sodium: 140 mEq/L (ref 135–145)
TOTAL PROTEIN: 6.1 g/dL (ref 6.0–8.3)

## 2016-04-27 LAB — TSH: TSH: 0.46 u[IU]/mL (ref 0.35–4.50)

## 2016-04-27 LAB — GLUCOSE, POCT (MANUAL RESULT ENTRY): POC GLUCOSE: 174 mg/dL — AB (ref 70–99)

## 2016-04-27 NOTE — Progress Notes (Signed)
Thyroid level is slightly high.  If he is taking 137 g levothyroxine need to cut back to 125 g daily. His kidney test is improved since hospitalization, labs have been sent to Dr. Justin Mend

## 2016-04-27 NOTE — Progress Notes (Signed)
Patient ID: Mark [name], male   DOB: Nov 09, 1940, 76 y.o.   MRN: [id number]   Reason for Appointment: Diabetes follow-up   History of Present Illness   Diagnosis: Type 2 DIABETES MELITUS, long-standing and insulin-dependent      Previous history: He has been on basal bolus insulin regimen for several years. Previously had been taking much larger doses of Lantus but is now requiring less since he had lost weight and had intercurrent illnesses He had done very well with Victoza in the past which had helped weight control and glucose but he had nausea and did not want to pay the high cost Because of his difficulty affording Lantus and Humalog insulin he was switched to NPH and Humalog in 5/15  Recent history:    Insulin regimen: Lantus 5 units at bedtime NovoLog as needed for high readings  He has not been seen in follow-up for about 7 months now Previously was on the V-go pump  His A1c has been better late last year, previously mostly over 8%  Current blood sugar patterns and problems identified:  He has required almost minimal amounts of insulin with all his weight loss, he thinks he has lost 60 pounds  Also has had a couple of hospitalizations and has come home only about a nursing home  He is not able to take care of his diabetes and is dependent on family members and other caretakers  No record of blood sugars available and not clear how often he is checking  Does not know what his blood sugars are recently  Has very little appetite and sometimes will have boost as a protein drink  Today without any food his blood sugar is about 180  Noninsulin hypoglycemic drugs: none       Monitors blood glucose:  1-2 times a day.    Glucometer: Verio       Blood Glucose readings: Glucometer download not available  Meals:  small recently    Physical activity: exercise: None          Wt Readings from Last 3 Encounters:  04/15/16 164 lb (74.4 kg)  03/20/16 167 lb 8.8  oz (76 kg)  02/17/16 165 lb 4.8 oz (75 kg)     Lab Results  Component Value Date   HGBA1C 6.7 (H) 02/12/2016   HGBA1C 8.2 (H) 10/29/2015   HGBA1C 8.1 (H) 10/28/2015   Lab Results  Component Value Date   MICROALBUR 88.1 (H) 06/06/2015   LDLCALC 39 02/13/2016   CREATININE 2.64 (H) 03/20/2016      Allergies as of 04/27/2016      Reactions   Tape Other (See Comments)   SKIN WILL TEAR!!      Medication List       Accurate as of 04/27/16  2:04 PM. Always use your most recent med list.          acetaminophen 500 MG tablet Commonly known as:  TYLENOL Take 500 mg by mouth every 6 (six) hours as needed for mild pain or moderate pain.   allopurinol 300 MG tablet Commonly known as:  ZYLOPRIM Take 150 mg by mouth in the morning   feeding supplement Liqd Take 1 Container by mouth 3 (three) times daily with meals.   insulin aspart 100 UNIT/ML injection Commonly known as:  novoLOG Inject 0-12 Units into the skin 3 (three) times daily before meals.   insulin glargine 100 UNIT/ML injection Commonly known as:  LANTUS Inject 0.05 mLs (5  Units total) into the skin at bedtime.   ketoconazole 2 % shampoo Commonly known as:  NIZORAL Apply 1 application topically 2 (two) times a week.   levothyroxine 137 MCG tablet Commonly known as:  SYNTHROID, LEVOTHROID TAKE ONE TABLET BY MOUTH DAILY BEFORE breakfast   metoprolol succinate 50 MG 24 hr tablet Commonly known as:  TOPROL-XL Take 50 mg by mouth daily.   multivitamin Tabs tablet Take 1 tablet by mouth at bedtime.   nystatin powder Generic drug:  nystatin Apply 1 g topically daily as needed (irritation).   OLANZapine 5 MG tablet Commonly known as:  ZYPREXA Take 5 mg by mouth at bedtime.   pantoprazole 40 MG tablet Commonly known as:  PROTONIX Take 1 tablet (40 mg total) by mouth daily.   polyethylene glycol packet Commonly known as:  MIRALAX / GLYCOLAX Take 17 g by mouth daily.   predniSONE 5 MG tablet Commonly  known as:  DELTASONE Take 5 mg by mouth daily.   PROGRAF 0.5 MG capsule Generic drug:  tacrolimus Take 0.5 mg by mouth 2 (two) times daily.   rosuvastatin 5 MG tablet Commonly known as:  CRESTOR Take 5 mg by mouth every evening.   SURE COMFORT PEN NEEDLES 32G X 4 MM Misc Generic drug:  Insulin Pen Needle USE AS DIRECTED 5 TIMES DAILY SUBQUE.   tamsulosin 0.4 MG Caps capsule Commonly known as:  FLOMAX Take 1 capsule (0.4 mg total) by mouth daily after supper.   terbinafine 1 % cream Commonly known as:  LAMISIL AT Apply 1 application topically 2 (two) times daily. Bilateral feet   vitamin B-12 100 MCG tablet Commonly known as:  CYANOCOBALAMIN Take 100 mcg by mouth daily.   warfarin 2 MG tablet Commonly known as:  COUMADIN Take 2 mg by mouth at bedtime.       Allergies:  Allergies  Allergen Reactions  . Tape Other (See Comments)    SKIN WILL TEAR!!    Past Medical History:  Diagnosis Date  . Acute pancreatitis 02/12/2016  . Atrial fibrillation ([place])   . Atrial fibrillation with RVR ([place]) 02/12/2016  . CHF (congestive heart failure) ([place])   . Diabetes ([place])   . Diabetic neuropathy ([place])   . Gout   . Hypertension   . Hypothyroidism   . 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 Renal disorder    kidney transplant   . Ulcer of other part of foot 12/15/2013    Past Surgical History:  Procedure Laterality Date  . AMPUTATION Left 11/06/2015   Procedure: AMPUTATION LEFT GREAT TOE;  Surgeon: Elam Dutch, MD;  Location: [facility];  Service: Vascular;  Laterality: Left;  . ANKLE FRACTURE SURGERY    . AV FISTULA PLACEMENT Left 11/19/2015   Procedure: LEFT ARM ARTERIOVENOUS (AV) FISTULA CREATION;  Surgeon: Waynetta Sandy, MD;  Location: [facility];  Service: Vascular;  Laterality: Left;  . CATARACT EXTRACTION    . INSERTION OF DIALYSIS CATHETER Right 11/19/2015   Procedure: INSERTION OF DIALYSIS CATHETER RIGHT INTERNAL JUGULAR;  Surgeon: Waynetta Sandy, MD;  Location: [facility];  Service: Vascular;  Laterality: Right;  . IR GENERIC HISTORICAL  11/14/2015   IR FLUORO GUIDE CV LINE RIGHT 11/14/2015 Markus Daft, MD [facility]  . IR GENERIC HISTORICAL  11/14/2015   IR US GUIDE VASC ACCESS RIGHT 11/14/2015 Markus Daft, MD [facility]  . KIDNEY TRANSPLANT  2012  . MELANOMA EXCISION    . NEPHRECTOMY TRANSPLANTED ORGAN    . PERIPHERAL  VASCULAR CATHETERIZATION N/A 11/05/2015   Procedure: Abdominal Aortogram w/Lower Extremity;  Surgeon: Serafina Mitchell, MD;  Location: [facility];  Service: Cardiovascular;  Laterality: N/A;  . PERIPHERAL VASCULAR CATHETERIZATION Left 11/05/2015   Procedure: Peripheral Vascular Atherectomy;  Surgeon: Serafina Mitchell, MD;  Location: [facility];  Service: Cardiovascular;  Laterality: Left;  popiteal  . PERIPHERAL VASCULAR CATHETERIZATION Left 11/05/2015   Procedure: Peripheral Vascular Balloon Angioplasty;  Surgeon: Serafina Mitchell, MD;  Location: [facility];  Service: Cardiovascular;  Laterality: Left;  anterior tib    Family History  Problem Relation Age of Onset  . Bradycardia Mother   . Stroke Father     Social History:  reports that he has never smoked. He has never used smokeless tobacco. He reports that he does not drink alcohol or use drugs.  Review of Systems   Records of Hospital discharge reviewed and medications reviewed in detail, several changes have been made since last visit  Has a history of chronic atrial fibrillation  Edema: Was previously treated with diuretics, does not appear to be taking any now  RENAL dysfunction: This is being followed  by his nephrologist Recent level worse, last creatinine level as follows   Lab Results  Component Value Date   CREATININE 2.64 (H) 03/20/2016    Mild hypothyroidism, Previously stable on 88 mcg levothyroxine; not clear why his dose has been increased His last TSH was low in the hospital In his discharge instructions indicated 125 g dosageBut he is  currently taking 137 g   Lab Results  Component Value Date   TSH 0.126 (L) 02/12/2016   HYPERLIPIDEMIA: History of high triglycerides, resolved with weight loss Not on any medications currently for triglycerides, is on Crestor  Previously had intolerance to niacin  Lab Results  Component Value Date   CHOL 74 02/13/2016   HDL 13 (L) 02/13/2016   LDLCALC 39 02/13/2016   LDLDIRECT 119.0 06/06/2015   TRIG 109 02/13/2016   CHOLHDL 5.7 02/13/2016    NEUROPATHY: He has significant numbness in feet, no significant discomfort or paresthesia.   Vascular disease: He has had to amputation and still being followed for a foot infection .     Examination:   BP 110/70   Pulse 65   Ht 5\' 9"  (1.753 m)   SpO2 96%   There is no height or weight on file to calculate BMI.      Assesment/ PLAN:  DIABETES:  See history of present illness for detailed discussion of his current blood sugar patterns, insulin regimen and problems identified  He has had a significant change in his general condition since his last visit in June Taking only minimal insulin now, only about 5 units Lantus, previously has history of insulin resistance Difficult to assess his insulin requirement because he has not monitored his blood sugar monitor and No record or recollection of his blood sugars. He is not able to take care of his own diabetes currently  HYPOTHYROIDISM: His dose of levothyroxine is much higher than last year and with his significant weight loss unlikely that he needs 137 g, last TSH was low  Recommendations:  Patient instructions reviewed with the patient and his granddaughter  He needs to check his blood sugars regularly and his family will help him with this, given instructions on how often to check in when  Will increase his Lantus slightly since readings appear to be relatively high on 5 units  Will not start a scheduled mealtime NovoLog doses as yet unless there is a clear pattern of  high readings after meals  Eat regular meals  He will call back but his blood sugars in one week from his monitor and his family member was instructed on the changes and timing of glucose monitoring  He will avoid regular boost and use the diabetic boost, need to increase calories to regain some weight  Check thyroid levels and renal function   Patient Instructions  Check blood sugars on waking up  3-4x weekly  Also check blood sugars before lunch or supper and some about 2 hours after dinner  Recommended blood sugar levels on waking up is 90-140 and about 2 hours after meal is 130-180  Please bring your blood sugar monitor to each visit, thank you  Diabetic boost  8 Lantus daily  Call sugar readings in 1 week   Evaluation and management time for multiple problems, review of records and labs and also including counseling = 25 minutes  Saraiyah Hemminger 04/27/2016, 2:04 PM

## 2016-04-27 NOTE — Patient Instructions (Signed)
Check blood sugars on waking up  3-4x weekly  Also check blood sugars before lunch or supper and some about 2 hours after dinner  Recommended blood sugar levels on waking up is 90-140 and about 2 hours after meal is 130-180  Please bring your blood sugar monitor to each visit, thank you  Diabetic boost  8 Lantus daily  Call sugar readings in 1 week

## 2016-04-28 DIAGNOSIS — I129 Hypertensive chronic kidney disease with stage 1 through stage 4 chronic kidney disease, or unspecified chronic kidney disease: Secondary | ICD-10-CM | POA: Diagnosis not present

## 2016-04-28 DIAGNOSIS — N183 Chronic kidney disease, stage 3 (moderate): Secondary | ICD-10-CM | POA: Diagnosis not present

## 2016-04-28 DIAGNOSIS — E1129 Type 2 diabetes mellitus with other diabetic kidney complication: Secondary | ICD-10-CM | POA: Diagnosis not present

## 2016-04-28 DIAGNOSIS — D631 Anemia in chronic kidney disease: Secondary | ICD-10-CM | POA: Diagnosis not present

## 2016-04-28 DIAGNOSIS — N2581 Secondary hyperparathyroidism of renal origin: Secondary | ICD-10-CM | POA: Diagnosis not present

## 2016-04-29 ENCOUNTER — Telehealth: Payer: Self-pay | Admitting: Endocrinology

## 2016-04-29 DIAGNOSIS — L97512 Non-pressure chronic ulcer of other part of right foot with fat layer exposed: Secondary | ICD-10-CM | POA: Diagnosis not present

## 2016-04-29 DIAGNOSIS — I132 Hypertensive heart and chronic kidney disease with heart failure and with stage 5 chronic kidney disease, or end stage renal disease: Secondary | ICD-10-CM | POA: Diagnosis not present

## 2016-04-29 DIAGNOSIS — Z89412 Acquired absence of left great toe: Secondary | ICD-10-CM | POA: Diagnosis not present

## 2016-04-29 DIAGNOSIS — E11621 Type 2 diabetes mellitus with foot ulcer: Secondary | ICD-10-CM | POA: Diagnosis not present

## 2016-04-29 DIAGNOSIS — Z4781 Encounter for orthopedic aftercare following surgical amputation: Secondary | ICD-10-CM | POA: Diagnosis not present

## 2016-04-29 DIAGNOSIS — I5020 Unspecified systolic (congestive) heart failure: Secondary | ICD-10-CM | POA: Diagnosis not present

## 2016-04-29 NOTE — Telephone Encounter (Signed)
Random cbg is 435 right now  They administered 15 u of sliding scale novolog   [facility] calling her name was [facility]

## 2016-04-29 NOTE — Telephone Encounter (Signed)
They need to increase his Lantus to 14 units instead of 8 units He needs to start taking 6 units of NovoLog before every meal along with extra sliding scale for high readings He needs to make sure he is not drinking drinks with sugar and use diabetic boost instead of regular boost or Ensure

## 2016-04-30 DIAGNOSIS — R0602 Shortness of breath: Secondary | ICD-10-CM | POA: Diagnosis not present

## 2016-04-30 DIAGNOSIS — L89321 Pressure ulcer of left buttock, stage 1: Secondary | ICD-10-CM | POA: Diagnosis not present

## 2016-04-30 DIAGNOSIS — R29898 Other symptoms and signs involving the musculoskeletal system: Secondary | ICD-10-CM | POA: Diagnosis not present

## 2016-04-30 DIAGNOSIS — L03116 Cellulitis of left lower limb: Secondary | ICD-10-CM | POA: Diagnosis not present

## 2016-04-30 DIAGNOSIS — N184 Chronic kidney disease, stage 4 (severe): Secondary | ICD-10-CM | POA: Diagnosis not present

## 2016-04-30 DIAGNOSIS — G4733 Obstructive sleep apnea (adult) (pediatric): Secondary | ICD-10-CM | POA: Diagnosis not present

## 2016-04-30 DIAGNOSIS — I70245 Atherosclerosis of native arteries of left leg with ulceration of other part of foot: Secondary | ICD-10-CM | POA: Diagnosis not present

## 2016-04-30 DIAGNOSIS — Z7901 Long term (current) use of anticoagulants: Secondary | ICD-10-CM | POA: Diagnosis not present

## 2016-04-30 NOTE — Telephone Encounter (Signed)
Gave instructions to the Home health care nurse and she stated an understanding

## 2016-05-01 DIAGNOSIS — Z7901 Long term (current) use of anticoagulants: Secondary | ICD-10-CM | POA: Diagnosis not present

## 2016-05-01 DIAGNOSIS — I482 Chronic atrial fibrillation: Secondary | ICD-10-CM | POA: Diagnosis not present

## 2016-05-04 DIAGNOSIS — E11621 Type 2 diabetes mellitus with foot ulcer: Secondary | ICD-10-CM | POA: Diagnosis not present

## 2016-05-04 DIAGNOSIS — L97512 Non-pressure chronic ulcer of other part of right foot with fat layer exposed: Secondary | ICD-10-CM | POA: Diagnosis not present

## 2016-05-04 DIAGNOSIS — Z89412 Acquired absence of left great toe: Secondary | ICD-10-CM | POA: Diagnosis not present

## 2016-05-04 DIAGNOSIS — Z4781 Encounter for orthopedic aftercare following surgical amputation: Secondary | ICD-10-CM | POA: Diagnosis not present

## 2016-05-04 DIAGNOSIS — I132 Hypertensive heart and chronic kidney disease with heart failure and with stage 5 chronic kidney disease, or end stage renal disease: Secondary | ICD-10-CM | POA: Diagnosis not present

## 2016-05-04 DIAGNOSIS — I5020 Unspecified systolic (congestive) heart failure: Secondary | ICD-10-CM | POA: Diagnosis not present

## 2016-05-06 DIAGNOSIS — I132 Hypertensive heart and chronic kidney disease with heart failure and with stage 5 chronic kidney disease, or end stage renal disease: Secondary | ICD-10-CM | POA: Diagnosis not present

## 2016-05-06 DIAGNOSIS — E1129 Type 2 diabetes mellitus with other diabetic kidney complication: Secondary | ICD-10-CM | POA: Diagnosis not present

## 2016-05-06 DIAGNOSIS — Z89412 Acquired absence of left great toe: Secondary | ICD-10-CM | POA: Diagnosis not present

## 2016-05-06 DIAGNOSIS — Z4781 Encounter for orthopedic aftercare following surgical amputation: Secondary | ICD-10-CM | POA: Diagnosis not present

## 2016-05-06 DIAGNOSIS — I5020 Unspecified systolic (congestive) heart failure: Secondary | ICD-10-CM | POA: Diagnosis not present

## 2016-05-06 DIAGNOSIS — L97512 Non-pressure chronic ulcer of other part of right foot with fat layer exposed: Secondary | ICD-10-CM | POA: Diagnosis not present

## 2016-05-06 DIAGNOSIS — Z992 Dependence on renal dialysis: Secondary | ICD-10-CM | POA: Diagnosis not present

## 2016-05-06 DIAGNOSIS — N186 End stage renal disease: Secondary | ICD-10-CM | POA: Diagnosis not present

## 2016-05-06 DIAGNOSIS — E11621 Type 2 diabetes mellitus with foot ulcer: Secondary | ICD-10-CM | POA: Diagnosis not present

## 2016-05-08 ENCOUNTER — Encounter (HOSPITAL_COMMUNITY): Payer: Self-pay | Admitting: Emergency Medicine

## 2016-05-08 ENCOUNTER — Inpatient Hospital Stay (HOSPITAL_COMMUNITY)
Admission: EM | Admit: 2016-05-08 | Discharge: 2016-05-14 | DRG: 637 | Disposition: A | Discharge status: Home Health | Payer: Medicare Other | Attending: Internal Medicine | Admitting: Internal Medicine

## 2016-05-08 ENCOUNTER — Observation Stay (HOSPITAL_COMMUNITY): Payer: Medicare Other

## 2016-05-08 ENCOUNTER — Telehealth: Payer: Self-pay | Admitting: Endocrinology

## 2016-05-08 DIAGNOSIS — E1140 Type 2 diabetes mellitus with diabetic neuropathy, unspecified: Secondary | ICD-10-CM | POA: Diagnosis present

## 2016-05-08 DIAGNOSIS — Z940 Kidney transplant status: Secondary | ICD-10-CM | POA: Diagnosis not present

## 2016-05-08 DIAGNOSIS — R531 Weakness: Secondary | ICD-10-CM | POA: Diagnosis not present

## 2016-05-08 DIAGNOSIS — R7309 Other abnormal glucose: Secondary | ICD-10-CM | POA: Diagnosis not present

## 2016-05-08 DIAGNOSIS — L97528 Non-pressure chronic ulcer of other part of left foot with other specified severity: Secondary | ICD-10-CM | POA: Diagnosis present

## 2016-05-08 DIAGNOSIS — Z89412 Acquired absence of left great toe: Secondary | ICD-10-CM

## 2016-05-08 DIAGNOSIS — Z66 Do not resuscitate: Secondary | ICD-10-CM | POA: Diagnosis not present

## 2016-05-08 DIAGNOSIS — I482 Chronic atrial fibrillation, unspecified: Secondary | ICD-10-CM | POA: Diagnosis present

## 2016-05-08 DIAGNOSIS — I5032 Chronic diastolic (congestive) heart failure: Secondary | ICD-10-CM | POA: Diagnosis not present

## 2016-05-08 DIAGNOSIS — E11649 Type 2 diabetes mellitus with hypoglycemia without coma: Secondary | ICD-10-CM | POA: Diagnosis present

## 2016-05-08 DIAGNOSIS — N390 Urinary tract infection, site not specified: Secondary | ICD-10-CM

## 2016-05-08 DIAGNOSIS — Z4781 Encounter for orthopedic aftercare following surgical amputation: Secondary | ICD-10-CM | POA: Diagnosis not present

## 2016-05-08 DIAGNOSIS — Z992 Dependence on renal dialysis: Secondary | ICD-10-CM

## 2016-05-08 DIAGNOSIS — B3741 Candidal cystitis and urethritis: Secondary | ICD-10-CM | POA: Diagnosis present

## 2016-05-08 DIAGNOSIS — N184 Chronic kidney disease, stage 4 (severe): Secondary | ICD-10-CM | POA: Diagnosis present

## 2016-05-08 DIAGNOSIS — E11621 Type 2 diabetes mellitus with foot ulcer: Secondary | ICD-10-CM | POA: Diagnosis not present

## 2016-05-08 DIAGNOSIS — E1122 Type 2 diabetes mellitus with diabetic chronic kidney disease: Secondary | ICD-10-CM | POA: Diagnosis not present

## 2016-05-08 DIAGNOSIS — I739 Peripheral vascular disease, unspecified: Secondary | ICD-10-CM | POA: Diagnosis present

## 2016-05-08 DIAGNOSIS — E1110 Type 2 diabetes mellitus with ketoacidosis without coma: Secondary | ICD-10-CM | POA: Diagnosis not present

## 2016-05-08 DIAGNOSIS — Z6824 Body mass index (BMI) 24.0-24.9, adult: Secondary | ICD-10-CM

## 2016-05-08 DIAGNOSIS — R5381 Other malaise: Secondary | ICD-10-CM

## 2016-05-08 DIAGNOSIS — R000 Tachycardia, unspecified: Secondary | ICD-10-CM

## 2016-05-08 DIAGNOSIS — IMO0002 Reserved for concepts with insufficient information to code with codable children: Secondary | ICD-10-CM

## 2016-05-08 DIAGNOSIS — R739 Hyperglycemia, unspecified: Secondary | ICD-10-CM | POA: Diagnosis not present

## 2016-05-08 DIAGNOSIS — I1310 Hypertensive heart and chronic kidney disease without heart failure, with stage 1 through stage 4 chronic kidney disease, or unspecified chronic kidney disease: Secondary | ICD-10-CM | POA: Diagnosis present

## 2016-05-08 DIAGNOSIS — E1165 Type 2 diabetes mellitus with hyperglycemia: Secondary | ICD-10-CM | POA: Diagnosis present

## 2016-05-08 DIAGNOSIS — Z7952 Long term (current) use of systemic steroids: Secondary | ICD-10-CM

## 2016-05-08 DIAGNOSIS — E039 Hypothyroidism, unspecified: Secondary | ICD-10-CM | POA: Diagnosis present

## 2016-05-08 DIAGNOSIS — I272 Pulmonary hypertension, unspecified: Secondary | ICD-10-CM | POA: Diagnosis present

## 2016-05-08 DIAGNOSIS — E876 Hypokalemia: Secondary | ICD-10-CM | POA: Diagnosis present

## 2016-05-08 DIAGNOSIS — Z22322 Carrier or suspected carrier of Methicillin resistant Staphylococcus aureus: Secondary | ICD-10-CM

## 2016-05-08 DIAGNOSIS — E43 Unspecified severe protein-calorie malnutrition: Secondary | ICD-10-CM | POA: Diagnosis not present

## 2016-05-08 DIAGNOSIS — M109 Gout, unspecified: Secondary | ICD-10-CM | POA: Diagnosis present

## 2016-05-08 DIAGNOSIS — I472 Ventricular tachycardia: Secondary | ICD-10-CM

## 2016-05-08 DIAGNOSIS — Z79899 Other long term (current) drug therapy: Secondary | ICD-10-CM

## 2016-05-08 DIAGNOSIS — R918 Other nonspecific abnormal finding of lung field: Secondary | ICD-10-CM | POA: Diagnosis not present

## 2016-05-08 DIAGNOSIS — R5383 Other fatigue: Secondary | ICD-10-CM | POA: Diagnosis not present

## 2016-05-08 DIAGNOSIS — D649 Anemia, unspecified: Secondary | ICD-10-CM | POA: Diagnosis present

## 2016-05-08 DIAGNOSIS — Z794 Long term (current) use of insulin: Secondary | ICD-10-CM | POA: Diagnosis not present

## 2016-05-08 DIAGNOSIS — Z993 Dependence on wheelchair: Secondary | ICD-10-CM

## 2016-05-08 DIAGNOSIS — E108 Type 1 diabetes mellitus with unspecified complications: Secondary | ICD-10-CM | POA: Diagnosis not present

## 2016-05-08 DIAGNOSIS — Z823 Family history of stroke: Secondary | ICD-10-CM

## 2016-05-08 DIAGNOSIS — N185 Chronic kidney disease, stage 5: Secondary | ICD-10-CM | POA: Diagnosis not present

## 2016-05-08 DIAGNOSIS — L97519 Non-pressure chronic ulcer of other part of right foot with unspecified severity: Secondary | ICD-10-CM | POA: Diagnosis present

## 2016-05-08 DIAGNOSIS — Z7901 Long term (current) use of anticoagulants: Secondary | ICD-10-CM

## 2016-05-08 DIAGNOSIS — M869 Osteomyelitis, unspecified: Secondary | ICD-10-CM | POA: Diagnosis present

## 2016-05-08 DIAGNOSIS — E1169 Type 2 diabetes mellitus with other specified complication: Secondary | ICD-10-CM | POA: Diagnosis present

## 2016-05-08 DIAGNOSIS — I132 Hypertensive heart and chronic kidney disease with heart failure and with stage 5 chronic kidney disease, or end stage renal disease: Secondary | ICD-10-CM | POA: Diagnosis not present

## 2016-05-08 DIAGNOSIS — I5020 Unspecified systolic (congestive) heart failure: Secondary | ICD-10-CM | POA: Diagnosis not present

## 2016-05-08 DIAGNOSIS — E1152 Type 2 diabetes mellitus with diabetic peripheral angiopathy with gangrene: Secondary | ICD-10-CM | POA: Diagnosis present

## 2016-05-08 DIAGNOSIS — N186 End stage renal disease: Secondary | ICD-10-CM

## 2016-05-08 DIAGNOSIS — L97512 Non-pressure chronic ulcer of other part of right foot with fat layer exposed: Secondary | ICD-10-CM | POA: Diagnosis not present

## 2016-05-08 HISTORY — DX: Chronic kidney disease, stage 4 (severe): N18.4

## 2016-05-08 HISTORY — DX: Peripheral vascular disease, unspecified: I73.9

## 2016-05-08 HISTORY — DX: Permanent atrial fibrillation: I48.21

## 2016-05-08 HISTORY — DX: Chronic diastolic (congestive) heart failure: I50.32

## 2016-05-08 LAB — CBC WITH DIFFERENTIAL/PLATELET
BASOS ABS: 0 10*3/uL (ref 0.0–0.1)
BASOS PCT: 0 %
EOS ABS: 0 10*3/uL (ref 0.0–0.7)
EOS PCT: 0 %
HCT: 35.6 % — ABNORMAL LOW (ref 39.0–52.0)
Hemoglobin: 11.9 g/dL — ABNORMAL LOW (ref 13.0–17.0)
Lymphocytes Relative: 22 %
Lymphs Abs: 1.1 10*3/uL (ref 0.7–4.0)
MCH: 30.6 pg (ref 26.0–34.0)
MCHC: 33.4 g/dL (ref 30.0–36.0)
MCV: 91.5 fL (ref 78.0–100.0)
MONO ABS: 0.5 10*3/uL (ref 0.1–1.0)
Monocytes Relative: 9 %
Neutro Abs: 3.5 10*3/uL (ref 1.7–7.7)
Neutrophils Relative %: 69 %
PLATELETS: 153 10*3/uL (ref 150–400)
RBC: 3.89 MIL/uL — AB (ref 4.22–5.81)
RDW: 17.6 % — AB (ref 11.5–15.5)
WBC: 5 10*3/uL (ref 4.0–10.5)

## 2016-05-08 LAB — CBG MONITORING, ED
GLUCOSE-CAPILLARY: 449 mg/dL — AB (ref 65–99)
Glucose-Capillary: 517 mg/dL (ref 65–99)

## 2016-05-08 LAB — COMPREHENSIVE METABOLIC PANEL
ALT: 11 U/L — AB (ref 17–63)
ANION GAP: 9 (ref 5–15)
AST: 26 U/L (ref 15–41)
Albumin: 2.5 g/dL — ABNORMAL LOW (ref 3.5–5.0)
Alkaline Phosphatase: 39 U/L (ref 38–126)
BUN: 47 mg/dL — ABNORMAL HIGH (ref 6–20)
CHLORIDE: 111 mmol/L (ref 101–111)
CO2: 20 mmol/L — AB (ref 22–32)
CREATININE: 1.92 mg/dL — AB (ref 0.61–1.24)
Calcium: 8.5 mg/dL — ABNORMAL LOW (ref 8.9–10.3)
GFR calc non Af Amer: 32 mL/min — ABNORMAL LOW (ref >60)
GFR, EST AFRICAN AMERICAN: 37 mL/min — AB (ref >60)
Glucose, Bld: 551 mg/dL (ref 65–99)
Potassium: 2.7 mmol/L — CL (ref 3.5–5.1)
SODIUM: 140 mmol/L (ref 135–145)
Total Bilirubin: 1.2 mg/dL (ref 0.3–1.2)
Total Protein: 5.8 g/dL — ABNORMAL LOW (ref 6.5–8.1)

## 2016-05-08 LAB — BASIC METABOLIC PANEL
Anion gap: 7 (ref 5–15)
BUN: 45 mg/dL — AB (ref 6–20)
CHLORIDE: 114 mmol/L — AB (ref 101–111)
CO2: 20 mmol/L — AB (ref 22–32)
CREATININE: 1.81 mg/dL — AB (ref 0.61–1.24)
Calcium: 8.4 mg/dL — ABNORMAL LOW (ref 8.9–10.3)
GFR calc Af Amer: 40 mL/min — ABNORMAL LOW (ref >60)
GFR calc non Af Amer: 35 mL/min — ABNORMAL LOW (ref >60)
Glucose, Bld: 459 mg/dL — ABNORMAL HIGH (ref 65–99)
Potassium: 3.2 mmol/L — ABNORMAL LOW (ref 3.5–5.1)
Sodium: 141 mmol/L (ref 135–145)

## 2016-05-08 LAB — PROTIME-INR
INR: 1.77
PROTHROMBIN TIME: 20.8 s — AB (ref 11.4–15.2)

## 2016-05-08 LAB — TROPONIN I: Troponin I: <0.03 ng/mL (ref <0.03)

## 2016-05-08 LAB — GLUCOSE, CAPILLARY: GLUCOSE-CAPILLARY: 436 mg/dL — AB (ref 65–99)

## 2016-05-08 LAB — INFLUENZA PANEL BY PCR (TYPE A & B)
Influenza A By PCR: NEGATIVE
Influenza B By PCR: NEGATIVE

## 2016-05-08 MED ORDER — VITAMIN B-12 100 MCG PO TABS
100.0000 ug | ORAL_TABLET | Freq: Every day | ORAL | Status: DC
  Administered 2016-05-09 – 2016-05-14 (×6): 100 ug via ORAL
  Filled 2016-05-08 (×6): qty 1

## 2016-05-08 MED ORDER — INSULIN ASPART 100 UNIT/ML ~~LOC~~ SOLN
7.0000 [IU] | Freq: Once | SUBCUTANEOUS | Status: AC
  Administered 2016-05-08: 7 [IU] via SUBCUTANEOUS

## 2016-05-08 MED ORDER — ALLOPURINOL 150 MG HALF TABLET
150.0000 mg | ORAL_TABLET | Freq: Every day | ORAL | Status: DC
  Administered 2016-05-09 – 2016-05-14 (×6): 150 mg via ORAL
  Filled 2016-05-08 (×6): qty 1

## 2016-05-08 MED ORDER — SODIUM CHLORIDE 0.9 % IV SOLN
30.0000 meq | Freq: Once | INTRAVENOUS | Status: AC
  Administered 2016-05-08: 30 meq via INTRAVENOUS
  Filled 2016-05-08: qty 15

## 2016-05-08 MED ORDER — OLANZAPINE 5 MG PO TABS
5.0000 mg | ORAL_TABLET | Freq: Every day | ORAL | Status: DC
  Administered 2016-05-08: 5 mg via ORAL
  Filled 2016-05-08: qty 1

## 2016-05-08 MED ORDER — TACROLIMUS 0.5 MG PO CAPS
0.5000 mg | ORAL_CAPSULE | Freq: Two times a day (BID) | ORAL | Status: DC
  Administered 2016-05-09 – 2016-05-11 (×6): 0.5 mg via ORAL
  Filled 2016-05-08 (×6): qty 1

## 2016-05-08 MED ORDER — INSULIN ASPART 100 UNIT/ML ~~LOC~~ SOLN
0.0000 [IU] | Freq: Three times a day (TID) | SUBCUTANEOUS | Status: DC
Start: 2016-05-09 — End: 2016-05-11
  Administered 2016-05-09: 3 [IU] via SUBCUTANEOUS
  Administered 2016-05-09: 2 [IU] via SUBCUTANEOUS
  Administered 2016-05-09: 3 [IU] via SUBCUTANEOUS
  Administered 2016-05-10: 5 [IU] via SUBCUTANEOUS
  Administered 2016-05-10: 15 [IU] via SUBCUTANEOUS
  Administered 2016-05-10: 5 [IU] via SUBCUTANEOUS
  Administered 2016-05-11: 11 [IU] via SUBCUTANEOUS

## 2016-05-08 MED ORDER — ASPIRIN 81 MG PO CHEW
81.0000 mg | CHEWABLE_TABLET | Freq: Every day | ORAL | Status: DC
  Administered 2016-05-08 – 2016-05-13 (×6): 81 mg via ORAL
  Filled 2016-05-08 (×6): qty 1

## 2016-05-08 MED ORDER — ROSUVASTATIN CALCIUM 5 MG PO TABS
5.0000 mg | ORAL_TABLET | Freq: Every evening | ORAL | Status: DC
  Administered 2016-05-09 – 2016-05-13 (×5): 5 mg via ORAL
  Filled 2016-05-08 (×5): qty 1

## 2016-05-08 MED ORDER — IPRATROPIUM-ALBUTEROL 0.5-2.5 (3) MG/3ML IN SOLN: 3.0000 mL | RESPIRATORY_TRACT | Status: DC | PRN

## 2016-05-08 MED ORDER — SODIUM CHLORIDE 0.9% FLUSH
3.0000 mL | Freq: Two times a day (BID) | INTRAVENOUS | Status: DC
  Administered 2016-05-08 – 2016-05-13 (×8): 3 mL via INTRAVENOUS

## 2016-05-08 MED ORDER — SODIUM CHLORIDE 0.9 % IV BOLUS (SEPSIS)
1000.0000 mL | Freq: Once | INTRAVENOUS | Status: AC
  Administered 2016-05-08: 1000 mL via INTRAVENOUS

## 2016-05-08 MED ORDER — HEPARIN SODIUM (PORCINE) 5000 UNIT/ML IJ SOLN
5000.0000 [IU] | Freq: Three times a day (TID) | INTRAMUSCULAR | Status: DC
  Administered 2016-05-08 – 2016-05-14 (×16): 5000 [IU] via SUBCUTANEOUS
  Filled 2016-05-08 (×15): qty 1

## 2016-05-08 MED ORDER — ACETAMINOPHEN 325 MG PO TABS: 650.0000 mg | ORAL_TABLET | Freq: Four times a day (QID) | ORAL | Status: DC | PRN

## 2016-05-08 MED ORDER — PANTOPRAZOLE SODIUM 40 MG PO TBEC: 40.0000 mg | DELAYED_RELEASE_TABLET | Freq: Every day | ORAL | Status: DC

## 2016-05-08 MED ORDER — INSULIN GLARGINE 100 UNIT/ML ~~LOC~~ SOLN
5.0000 [IU] | Freq: Every day | SUBCUTANEOUS | Status: DC
  Administered 2016-05-08 – 2016-05-10 (×2): 5 [IU] via SUBCUTANEOUS
  Filled 2016-05-08 (×3): qty 0.05

## 2016-05-08 MED ORDER — METOPROLOL SUCCINATE ER 50 MG PO TB24
50.0000 mg | ORAL_TABLET | Freq: Every day | ORAL | Status: DC
  Administered 2016-05-09 – 2016-05-14 (×6): 50 mg via ORAL
  Filled 2016-05-08 (×6): qty 1

## 2016-05-08 MED ORDER — PANTOPRAZOLE SODIUM 40 MG PO TBEC
40.0000 mg | DELAYED_RELEASE_TABLET | Freq: Two times a day (BID) | ORAL | Status: DC
  Administered 2016-05-08 – 2016-05-14 (×12): 40 mg via ORAL
  Filled 2016-05-08 (×12): qty 1

## 2016-05-08 MED ORDER — RENA-VITE PO TABS
1.0000 | ORAL_TABLET | Freq: Every day | ORAL | Status: DC
  Administered 2016-05-10 – 2016-05-13 (×4): 1 via ORAL
  Filled 2016-05-08 (×4): qty 1

## 2016-05-08 MED ORDER — INSULIN ASPART 100 UNIT/ML ~~LOC~~ SOLN
0.0000 [IU] | Freq: Every day | SUBCUTANEOUS | Status: DC
Start: 2016-05-08 — End: 2016-05-14
  Administered 2016-05-10: 3 [IU] via SUBCUTANEOUS
  Administered 2016-05-10: 4 [IU] via SUBCUTANEOUS
  Administered 2016-05-11: 2 [IU] via SUBCUTANEOUS
  Administered 2016-05-12 – 2016-05-13 (×2): 3 [IU] via SUBCUTANEOUS

## 2016-05-08 MED ORDER — LEVOTHYROXINE SODIUM 125 MCG PO TABS
125.0000 ug | ORAL_TABLET | Freq: Every day | ORAL | Status: DC
  Administered 2016-05-09 – 2016-05-14 (×6): 125 ug via ORAL
  Filled 2016-05-08 (×8): qty 1

## 2016-05-08 MED ORDER — PREDNISONE 5 MG PO TABS
5.0000 mg | ORAL_TABLET | Freq: Every day | ORAL | Status: DC
  Administered 2016-05-09 – 2016-05-14 (×6): 5 mg via ORAL
  Filled 2016-05-08 (×6): qty 1

## 2016-05-08 MED ORDER — POTASSIUM CHLORIDE CRYS ER 20 MEQ PO TBCR
40.0000 meq | EXTENDED_RELEASE_TABLET | Freq: Once | ORAL | Status: AC
  Administered 2016-05-08: 40 meq via ORAL
  Filled 2016-05-08: qty 2

## 2016-05-08 MED ORDER — SODIUM CHLORIDE 0.9 % IV SOLN
INTRAVENOUS | Status: AC
  Administered 2016-05-08: 20:00:00 via INTRAVENOUS

## 2016-05-08 MED ORDER — ONDANSETRON HCL 4 MG/2ML IJ SOLN: 4.0000 mg | Freq: Four times a day (QID) | INTRAMUSCULAR | Status: DC | PRN

## 2016-05-08 MED ORDER — BOOST / RESOURCE BREEZE PO LIQD
1.0000 | Freq: Three times a day (TID) | ORAL | Status: DC
  Administered 2016-05-09 – 2016-05-13 (×9): 1 via ORAL

## 2016-05-08 MED ORDER — ONDANSETRON HCL 4 MG PO TABS: 4.0000 mg | ORAL_TABLET | Freq: Four times a day (QID) | ORAL | Status: DC | PRN

## 2016-05-08 MED ORDER — MAGNESIUM SULFATE 2 GM/50ML IV SOLN
2.0000 g | Freq: Once | INTRAVENOUS | Status: AC
  Administered 2016-05-08: 2 g via INTRAVENOUS
  Filled 2016-05-08: qty 50

## 2016-05-08 MED ORDER — TAMSULOSIN HCL 0.4 MG PO CAPS
0.4000 mg | ORAL_CAPSULE | Freq: Every day | ORAL | Status: DC
  Administered 2016-05-09 – 2016-05-13 (×5): 0.4 mg via ORAL
  Filled 2016-05-08 (×5): qty 1

## 2016-05-08 MED ORDER — FENOFIBRATE 160 MG PO TABS
160.0000 mg | ORAL_TABLET | Freq: Every day | ORAL | Status: DC
  Administered 2016-05-09 – 2016-05-14 (×6): 160 mg via ORAL
  Filled 2016-05-08 (×6): qty 1

## 2016-05-08 MED ORDER — HYDROCODONE-ACETAMINOPHEN 5-325 MG PO TABS
1.0000 | ORAL_TABLET | ORAL | Status: DC | PRN
  Administered 2016-05-10 – 2016-05-13 (×3): 2 via ORAL
  Administered 2016-05-13: 1 via ORAL
  Filled 2016-05-08 (×2): qty 2
  Filled 2016-05-08: qty 1
  Filled 2016-05-08: qty 2
  Filled 2016-05-08: qty 1

## 2016-05-08 NOTE — ED Notes (Signed)
Bed: WA25 Expected date:  Expected time:  Means of arrival:  Comments: EMS - hyperglycemia 

## 2016-05-08 NOTE — ED Triage Notes (Signed)
Patient bib [facility] from home A & O X4.  Family stated that patient had a CBG yesterday of 450 and today read 566.  Patient is on a sliding scale insulin which is followed and the patient has a home health nurse that comes into the home to administer.  Per family patient has not been eating "lately" and has been feeling weak.  Per EMS patient has a shunt in the left arm.

## 2016-05-08 NOTE — ED Provider Notes (Signed)
[facility] DEPT Provider Note   CSN: [id number] Arrival date & time: 05/08/16  [id number]     History   Chief Complaint Chief Complaint  Patient presents with  . Hyperglycemia    HPI Mark [name] is a 76 y.o. male.  Mark [name] is a 76 y.o. Male with a history of DM and kidney transplant who presents to the ED via EMS with hyperglycemia. Patient lives at home with his daughter and has home health come to check on him and provide him insulin. According to EMS yesterday the patient's blood sugar was 450. Today his blood sugar was over 570. Patient reports he's had decreased appetite and feeling somewhat fatigued. He denies other complaints. He is being observed for a previous left great toe amputation with wound care. He does not believe he is currently on Coumadin, however this is currently in his medication list. He tells me he had a milkshake to drink last night and has had little appetite. Patient denies fevers, cough, recent illness, urinary symptoms, abdominal pain, nausea, vomiting, diarrhea, chest pain, shortness of breath, or rashes.   The history is provided by the patient, the EMS personnel and medical records. No language interpreter was used.  Hyperglycemia  Associated symptoms: fatigue   Associated symptoms: no abdominal pain, no chest pain, no dysuria, no fever, no nausea, no shortness of breath and no vomiting     Past Medical History:  Diagnosis Date  . Acute pancreatitis 02/12/2016  . Atrial fibrillation ([place])   . Atrial fibrillation with RVR ([place]) 02/12/2016  . CHF (congestive heart failure) ([place])   . Diabetes ([place])   . Diabetic neuropathy ([place])   . Gout   . Hypertension   . Hypothyroidism   . 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 Renal disorder    kidney transplant   . Ulcer of other part of foot 12/15/2013    Patient Active Problem List   Diagnosis Date Noted  . Hypokalemia 05/08/2016  . Hyperglycemia 05/08/2016  . Pressure injury of skin 03/18/2016  .  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 Dry gangrene ([place]) 03/17/2016  . Chronic atrial fibrillation ([place]) 03/17/2016  . ESRD on dialysis ([place]) 03/17/2016  . Chronic diastolic congestive heart failure ([place]) 03/17/2016  . BPH (benign prostatic hyperplasia) 03/17/2016  . Cellulitis of lower extremity   . Weight decrease   . Protein-calorie malnutrition, severe 02/14/2016  . Generalized weakness   . Renal transplant recipient 10/28/2015  . Diabetes mellitus with complication ([place]) [id number]  . Cellulitis of great toe of left foot 10/28/2015  . Diabetic peripheral neuropathy ([place]) 06/12/2014  . Hammer toe of right foot 02/16/2014  . Onychomycosis 02/02/2014  . Ulcer of right foot ([place]) 01/19/2014  . Ulcer of right ankle ([place]) 01/08/2014  . Ulcer of other part of foot 12/15/2013  . Type II diabetes mellitus, uncontrolled ([place]) 10/20/2012  . Other and unspecified hyperlipidemia 10/20/2012  . Hypothyroidism 10/20/2012  . Edema extremities 10/20/2012  . Gout 10/20/2012    Past Surgical History:  Procedure Laterality Date  . AMPUTATION Left 11/06/2015   Procedure: AMPUTATION LEFT GREAT TOE;  Surgeon: Elam Dutch, MD;  Location: [facility];  Service: Vascular;  Laterality: Left;  . ANKLE FRACTURE SURGERY    . AV FISTULA PLACEMENT Left 11/19/2015   Procedure: LEFT ARM ARTERIOVENOUS (AV) FISTULA CREATION;  Surgeon: Waynetta Sandy, MD;  Location: [facility];  Service: Vascular;  Laterality: Left;  . CATARACT EXTRACTION    . INSERTION OF DIALYSIS CATHETER  Right 11/19/2015   Procedure: INSERTION OF DIALYSIS CATHETER RIGHT INTERNAL JUGULAR;  Surgeon: Waynetta Sandy, MD;  Location: [facility];  Service: Vascular;  Laterality: Right;  . IR GENERIC HISTORICAL  11/14/2015   IR FLUORO GUIDE CV LINE RIGHT 11/14/2015 Markus Daft, MD [facility]  . IR GENERIC HISTORICAL  11/14/2015   IR US GUIDE VASC ACCESS RIGHT 11/14/2015 Markus Daft, MD [facility]  . KIDNEY TRANSPLANT  2012  . MELANOMA EXCISION    .  NEPHRECTOMY TRANSPLANTED ORGAN    . PERIPHERAL VASCULAR CATHETERIZATION N/A 11/05/2015   Procedure: Abdominal Aortogram w/Lower Extremity;  Surgeon: Serafina Mitchell, MD;  Location: [facility];  Service: Cardiovascular;  Laterality: N/A;  . PERIPHERAL VASCULAR CATHETERIZATION Left 11/05/2015   Procedure: Peripheral Vascular Atherectomy;  Surgeon: Serafina Mitchell, MD;  Location: [facility];  Service: Cardiovascular;  Laterality: Left;  popiteal  . PERIPHERAL VASCULAR CATHETERIZATION Left 11/05/2015   Procedure: Peripheral Vascular Balloon Angioplasty;  Surgeon: Serafina Mitchell, MD;  Location: [facility];  Service: Cardiovascular;  Laterality: Left;  anterior tib       Home Medications    Prior to Admission medications   Medication Sig Start Date End Date Taking? Authorizing Provider  acetaminophen (TYLENOL) 500 MG tablet Take 500 mg by mouth every 6 (six) hours as needed for mild pain or moderate pain.    Historical Provider, MD  allopurinol (ZYLOPRIM) 300 MG tablet Take 150 mg by mouth in the morning 11/26/15   Albertine Patricia, MD  feeding supplement (BOOST / RESOURCE BREEZE) LIQD Take 1 Container by mouth 3 (three) times daily with meals. 11/26/15   Silver Huguenin Elgergawy, MD  insulin aspart (NOVOLOG) 100 UNIT/ML injection Inject 0-12 Units into the skin 3 (three) times daily before meals.    Historical Provider, MD  insulin glargine (LANTUS) 100 UNIT/ML injection Inject 0.05 mLs (5 Units total) into the skin at bedtime. 02/17/16   Sela Hua, MD  ketoconazole (NIZORAL) 2 % shampoo Apply 1 application topically 2 (two) times a week.  12/14/13   Historical Provider, MD  levothyroxine (SYNTHROID, LEVOTHROID) 137 MCG tablet TAKE ONE TABLET BY MOUTH DAILY BEFORE breakfast 01/23/16   Historical Provider, MD  metoprolol succinate (TOPROL-XL) 50 MG 24 hr tablet Take 50 mg by mouth daily.  06/18/12   Historical Provider, MD  multivitamin (RENA-VIT) TABS tablet Take 1 tablet by mouth  at bedtime. 11/26/15   Silver Huguenin Elgergawy, MD  nystatin (MYCOSTATIN/NYSTOP) 100000 UNIT/GM POWD Apply 1 g topically daily as needed (irritation).  12/14/12   Historical Provider, MD  OLANZapine (ZYPREXA) 5 MG tablet Take 5 mg by mouth at bedtime.    Historical Provider, MD  pantoprazole (PROTONIX) 40 MG tablet Take 1 tablet (40 mg total) by mouth daily. 03/20/16   Venetia Maxon Rama, MD  polyethylene glycol (MIRALAX / GLYCOLAX) packet Take 17 g by mouth daily. 03/21/16   Christina P Rama, MD  predniSONE (DELTASONE) 5 MG tablet Take 5 mg by mouth daily. 03/15/14   Historical Provider, MD  rosuvastatin (CRESTOR) 5 MG tablet Take 5 mg by mouth every evening.    Historical Provider, MD  SURE COMFORT PEN NEEDLES 32G X 4 MM MISC USE AS DIRECTED 5 TIMES DAILY SUBQUE. 04/24/16   Elayne Snare, MD  tacrolimus (PROGRAF) 0.5 MG capsule Take 0.5 mg by mouth 2 (two) times daily.    Historical Provider, MD  tamsulosin (FLOMAX) 0.4 MG CAPS capsule Take 1 capsule (  0.4 mg total) by mouth daily after supper. 11/26/15   Silver Huguenin Elgergawy, MD  terbinafine (LAMISIL AT) 1 % cream Apply 1 application topically 2 (two) times daily. Bilateral feet 03/20/16   Venetia Maxon Rama, MD  vitamin B-12 (CYANOCOBALAMIN) 100 MCG tablet Take 100 mcg by mouth daily.    Historical Provider, MD  warfarin (COUMADIN) 2 MG tablet Take 2 mg by mouth at bedtime.    Historical Provider, MD    Family History Family History  Problem Relation Age of Onset  . Bradycardia Mother   . Stroke Father     Social History Social History  Substance Use Topics  . Smoking status: Never Smoker  . Smokeless tobacco: Never Used  . Alcohol use No     Allergies   Tape   Review of Systems Review of Systems  Constitutional: Positive for appetite change and fatigue. Negative for chills and fever.  HENT: Negative for congestion and sore throat.   Eyes: Negative for visual disturbance.  Respiratory: Negative for cough and shortness of breath.     Cardiovascular: Negative for chest pain.  Gastrointestinal: Negative for abdominal pain, diarrhea, nausea and vomiting.  Genitourinary: Negative for dysuria.  Musculoskeletal: Negative for back pain and neck pain.  Skin: Negative for rash.  Neurological: Negative for headaches.     Physical Exam Updated Vital Signs BP 169/85   Pulse 84   Resp 10   Ht 5\' 9"  (1.753 m)   Wt 73.5 kg   SpO2 100%   BMI 23.92 kg/m   Physical Exam  Constitutional: He is oriented to person, place, and time. He appears well-developed and well-nourished. No distress.  Nontoxic appearing.  HENT:  Head: Normocephalic and atraumatic.  Mouth/Throat: Oropharynx is clear and moist.  Eyes: Conjunctivae are normal. Pupils are equal, round, and reactive to light. Right eye exhibits no discharge. Left eye exhibits no discharge.  Neck: Neck supple.  Cardiovascular: Regular rhythm, normal heart sounds and intact distal pulses.  Exam reveals no gallop and no friction rub.   No murmur heard. Heart rate 104.  Pulmonary/Chest: Effort normal and breath sounds normal. No respiratory distress. He has no wheezes. He has no rales.  Abdominal: Soft. There is no tenderness. There is no guarding.  Abdomen soft and nontender to palpation.  Musculoskeletal: He exhibits no edema.  Lymphadenopathy:    He has no cervical adenopathy.  Neurological: He is alert and oriented to person, place, and time. Coordination normal.  Skin: Skin is warm and dry. Capillary refill takes less than 2 seconds. No rash noted. He is not diaphoretic. No erythema. No pallor.  Healing ulcer noted to his left foot near his first MTP joint. Left great toe is amputated. No evidence of infection. No discharge. No surrounding erythema or edema.   Psychiatric: He has a normal mood and affect. His behavior is normal.  Nursing note and vitals reviewed.    ED Treatments / Results  Labs (all labs ordered are listed, but only abnormal results are  displayed) Labs Reviewed  COMPREHENSIVE METABOLIC PANEL - Abnormal; Notable for the following:       Result Value   Potassium 2.7 (*)    CO2 20 (*)    Glucose, Bld 551 (*)    BUN 47 (*)    Creatinine, Ser 1.92 (*)    Calcium 8.5 (*)    Total Protein 5.8 (*)    Albumin 2.5 (*)    ALT 11 (*)    GFR calc  non Af Amer 32 (*)    GFR calc Af Amer 37 (*)    All other components within normal limits  CBC WITH DIFFERENTIAL/PLATELET - Abnormal; Notable for the following:    RBC 3.89 (*)    Hemoglobin 11.9 (*)    HCT 35.6 (*)    RDW 17.6 (*)    All other components within normal limits  PROTIME-INR - Abnormal; Notable for the following:    Prothrombin Time 20.8 (*)    All other components within normal limits  CBG MONITORING, ED - Abnormal; Notable for the following:    Glucose-Capillary 517 (*)    All other components within normal limits  CBG MONITORING, ED - Abnormal; Notable for the following:    Glucose-Capillary 449 (*)    All other components within normal limits  CULTURE, BLOOD (ROUTINE X 2)  CULTURE, BLOOD (ROUTINE X 2)  MAGNESIUM  BASIC METABOLIC PANEL  BASIC METABOLIC PANEL  CBC WITH DIFFERENTIAL/PLATELET  TROPONIN I  URINALYSIS, ROUTINE W REFLEX MICROSCOPIC  INFLUENZA PANEL BY PCR (TYPE A & B)  BASIC METABOLIC PANEL    EKG  EKG Interpretation None       Radiology None    Procedures Procedures (including critical care time)  Medications Ordered in ED Medications  potassium chloride 30 mEq in sodium chloride 0.9 % 265 mL (KCL MULTIRUN) IVPB (30 mEq Intravenous Given 05/08/16 1848)  levothyroxine (SYNTHROID, LEVOTHROID) tablet 125 mcg (not administered)  OLANZapine (ZYPREXA) tablet 5 mg (not administered)  pantoprazole (PROTONIX) EC tablet 40 mg (not administered)  rosuvastatin (CRESTOR) tablet 5 mg (not administered)  vitamin B-12 (CYANOCOBALAMIN) tablet 100 mcg (not administered)  acetaminophen (TYLENOL) tablet 650 mg (not administered)  allopurinol  (ZYLOPRIM) tablet 150 mg (not administered)  feeding supplement (BOOST / RESOURCE BREEZE) liquid 1 Container (not administered)  multivitamin (RENA-VIT) tablet 1 tablet (not administered)  tamsulosin (FLOMAX) capsule 0.4 mg (not administered)  predniSONE (DELTASONE) tablet 5 mg (not administered)  metoprolol succinate (TOPROL-XL) 24 hr tablet 50 mg (not administered)  tacrolimus (PROGRAF) capsule 0.5 mg (not administered)  heparin injection 5,000 Units (not administered)  sodium chloride flush (NS) 0.9 % injection 3 mL (not administered)  0.9 %  sodium chloride infusion (not administered)  HYDROcodone-acetaminophen (NORCO/VICODIN) 5-325 MG per tablet 1-2 tablet (not administered)  ondansetron (ZOFRAN) tablet 4 mg (not administered)    Or  ondansetron (ZOFRAN) injection 4 mg (not administered)  ipratropium-albuterol (DUONEB) 0.5-2.5 (3) MG/3ML nebulizer solution 3 mL (not administered)  magnesium sulfate IVPB 2 g 50 mL (not administered)  insulin glargine (LANTUS) injection 5 Units (not administered)  insulin aspart (novoLOG) injection 0-15 Units (not administered)  insulin aspart (novoLOG) injection 0-5 Units (not administered)  insulin aspart (novoLOG) injection 7 Units (not administered)  sodium chloride 0.9 % bolus 1,000 mL (0 mLs Intravenous Stopped 05/08/16 1834)  potassium chloride SA (K-DUR,KLOR-CON) CR tablet 40 mEq (40 mEq Oral Given 05/08/16 1835)     Initial Impression / Assessment and Plan / ED Course  I have reviewed the triage vital signs and the nursing notes.  Pertinent labs & imaging results that were available during my care of the patient were reviewed by me and considered in my medical decision making (see chart for details).    This is a 76 y.o. Male with a history of DM and kidney transplant who presents to the ED via EMS with hyperglycemia. Patient lives at home with his daughter and has home health come to check on him and provide  him insulin. According to EMS  yesterday the patient's blood sugar was 450. Today his blood sugar was over 570. Patient reports he's had decreased appetite and feeling somewhat fatigued. He denies other complaints. He is being observed for a previous left great toe amputation with wound care. He does not believe he is currently on Coumadin, however this is currently in his medication list. He tells me he had a milkshake to drink last night and has had little appetite. He denies cough or SOB. He denies fevers.  On exam the patient is afebrile nontoxic appearing. He appears slightly dry. He is mildly tachycardic with a heart rate of 104. His abdomen is soft and nontender to palpation. Lungs clear auscultation bilaterally. CBC shows no leukocytosis. CMP is marked for potassium of 2.7. Glucose is 551. Creatinine is around his baseline. Normal anion gap.  Patient received 1 L fluid bolus by EMS. 1 L fluid bolus started here. Will start potassium IV 30 milliequivalents and 40 mEq orally. Will hold on insulin until his potassium is stabilized. EKG without signs of hypokalemia. Will admit to medicine for hyperglycemia and hyperkalemia. Patient agrees with plan.    I consulted with Dr. Myna Hidalgo who accepted the patient for admission and requested temp orders for tele bed.   This patient was discussed with and evaluated by Dr. Ralene Bathe who agrees with assessment and plan.   Final Clinical Impressions(s) / ED Diagnoses   Final diagnoses:  Hyperglycemia  Hypokalemia  Malaise and fatigue    New Prescriptions Current Discharge Medication List       Waynetta Pean, Hershal Coria 05/08/16 2126    Quintella Reichert, MD 05/10/16 1256

## 2016-05-08 NOTE — Telephone Encounter (Signed)
Patient has been taken to the hospital

## 2016-05-08 NOTE — H&P (Signed)
History and Physical    JAZZMAN KERKER [id number] DOB: February 19, 1941 DOA: 05/08/2016  PCP: Rochel Brome, MD   Patient coming from: Home  Chief Complaint: High CBG's, malaise, N/V  HPI: Mark [name] is a 76 y.o. male with medical history significant for insulin-dependent diabetes mellitus, hypertension, chronic atrial fibrillation, renal transplant in 2005 with chronic kidney disease stage IV, now presenting to the emergency department for evaluation of malaise, nausea, vomiting, and hyperglycemia. Patient describes a progressive decline in his overall health over the past year. He has lost a lot of weight over this interval and has had a dramatic decrease in his insulin requirements. He is followed by endocrinology, but there has been difficulty managing his insulin as there had been no CBGs performed at home. He now has home health RN who checks his blood sugar and is found to be markedly elevated the past couple days. Over that same interval, the patient is had a loss of appetite with nausea and nonbloody nonbilious vomiting. He's also had loose stools, but denies any fever, chills, cough, dyspnea, chest pain, or palpitations. Home health nurse found a random glucose to be 566 today and administered 15 units of NovoLog. CBG remained greater than 500 and the patient was directed to the emergency department for further evaluation of this. Of note, patient has been taken off of Coumadin last week due to poor follow-up for INR checks and recurrent need for vitamin K.  ED Course: Upon arrival to the ED, patient is found to be mildly hypertensive, but with vitals otherwise stable. EKG features in atrial fibrillation with anterior Q waves. Chemistry panels notable for a potassium of 2.7, bicarbonate of 20, BUN 47, creatinine 1.92, and glucose 551. Patient had been given 1 L of normal saline en route to the hospital and was given another liter of normal saline in the emergency department. He was treated  with 40 mEq of oral potassium and 30 mEq of IV potassium. He remained hemodynamically stable in the ED with no apparent respiratory distress. He will be observed on the telemetry unit for ongoing evaluation and management of hyperglycemia, nausea vomiting, and critical hypokalemia.  Review of Systems:  All other systems reviewed and apart from HPI, are negative.  Past Medical History:  Diagnosis Date  . Acute pancreatitis 02/12/2016  . Atrial fibrillation ([place])   . Atrial fibrillation with RVR ([place]) 02/12/2016  . CHF (congestive heart failure) ([place])   . Diabetes ([place])   . Diabetic neuropathy ([place])   . Gout   . Hypertension   . Hypothyroidism   . 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 Renal disorder    kidney transplant   . Ulcer of other part of foot 12/15/2013    Past Surgical History:  Procedure Laterality Date  . AMPUTATION Left 11/06/2015   Procedure: AMPUTATION LEFT GREAT TOE;  Surgeon: Elam Dutch, MD;  Location: [place];  Service: Vascular;  Laterality: Left;  . ANKLE FRACTURE SURGERY    . AV FISTULA PLACEMENT Left 11/19/2015   Procedure: LEFT ARM ARTERIOVENOUS (AV) FISTULA CREATION;  Surgeon: Waynetta Sandy, MD;  Location: [place];  Service: Vascular;  Laterality: Left;  . CATARACT EXTRACTION    . INSERTION OF DIALYSIS CATHETER Right 11/19/2015   Procedure: INSERTION OF DIALYSIS CATHETER RIGHT INTERNAL JUGULAR;  Surgeon: Waynetta Sandy, MD;  Location: [place];  Service: Vascular;  Laterality: Right;  . IR GENERIC HISTORICAL  11/14/2015   IR FLUORO GUIDE CV LINE RIGHT 11/14/2015 Markus Daft,  MD [facility]  . IR GENERIC HISTORICAL  11/14/2015   IR US GUIDE VASC ACCESS RIGHT 11/14/2015 Markus Daft, MD [facility]  . KIDNEY TRANSPLANT  2012  . MELANOMA EXCISION    . NEPHRECTOMY TRANSPLANTED ORGAN    . PERIPHERAL VASCULAR CATHETERIZATION N/A 11/05/2015   Procedure: Abdominal Aortogram w/Lower Extremity;  Surgeon: Serafina Mitchell, MD;  Location: [facility];  Service:  Cardiovascular;  Laterality: N/A;  . PERIPHERAL VASCULAR CATHETERIZATION Left 11/05/2015   Procedure: Peripheral Vascular Atherectomy;  Surgeon: Serafina Mitchell, MD;  Location: [place] [facility];  Service: Cardiovascular;  Laterality: Left;  popiteal  . PERIPHERAL VASCULAR CATHETERIZATION Left 11/05/2015   Procedure: Peripheral Vascular Balloon Angioplasty;  Surgeon: Serafina Mitchell, MD;  Location: [facility];  Service: Cardiovascular;  Laterality: Left;  anterior tib     reports that he has never smoked. He has never used smokeless tobacco. He reports that he does not drink alcohol or use drugs.  Allergies  Allergen Reactions  . Tape Other (See Comments)    SKIN WILL TEAR!!    Family History  Problem Relation Age of Onset  . Bradycardia Mother   . Stroke Father      Prior to Admission medications   Medication Sig Start Date End Date Taking? Authorizing Provider  acetaminophen (TYLENOL) 500 MG tablet Take 500 mg by mouth every 6 (six) hours as needed for mild pain or moderate pain.    Historical Provider, MD  allopurinol (ZYLOPRIM) 300 MG tablet Take 150 mg by mouth in the morning 11/26/15   Albertine Patricia, MD  feeding supplement (BOOST / RESOURCE BREEZE) LIQD Take 1 Container by mouth 3 (three) times daily with meals. 11/26/15   Silver Huguenin Elgergawy, MD  insulin aspart (NOVOLOG) 100 UNIT/ML injection Inject 0-12 Units into the skin 3 (three) times daily before meals.    Historical Provider, MD  insulin glargine (LANTUS) 100 UNIT/ML injection Inject 0.05 mLs (5 Units total) into the skin at bedtime. 02/17/16   Sela Hua, MD  ketoconazole (NIZORAL) 2 % shampoo Apply 1 application topically 2 (two) times a week.  12/14/13   Historical Provider, MD  levothyroxine (SYNTHROID, LEVOTHROID) 137 MCG tablet TAKE ONE TABLET BY MOUTH DAILY BEFORE breakfast 01/23/16   Historical Provider, MD  metoprolol succinate (TOPROL-XL) 50 MG 24 hr tablet Take 50 mg by mouth daily.  06/18/12    Historical Provider, MD  multivitamin (RENA-VIT) TABS tablet Take 1 tablet by mouth at bedtime. 11/26/15   Silver Huguenin Elgergawy, MD  nystatin (MYCOSTATIN/NYSTOP) 100000 UNIT/GM POWD Apply 1 g topically daily as needed (irritation).  12/14/12   Historical Provider, MD  OLANZapine (ZYPREXA) 5 MG tablet Take 5 mg by mouth at bedtime.    Historical Provider, MD  pantoprazole (PROTONIX) 40 MG tablet Take 1 tablet (40 mg total) by mouth daily. 03/20/16   Venetia Maxon Rama, MD  polyethylene glycol (MIRALAX / GLYCOLAX) packet Take 17 g by mouth daily. 03/21/16   Christina P Rama, MD  predniSONE (DELTASONE) 5 MG tablet Take 5 mg by mouth daily. 03/15/14   Historical Provider, MD  rosuvastatin (CRESTOR) 5 MG tablet Take 5 mg by mouth every evening.    Historical Provider, MD  SURE COMFORT PEN NEEDLES 32G X 4 MM MISC USE AS DIRECTED 5 TIMES DAILY SUBQUE. 04/24/16   Elayne Snare, MD  tacrolimus (PROGRAF) 0.5 MG capsule Take 0.5 mg by mouth 2 (two) times daily.    Historical  Provider, MD  tamsulosin (FLOMAX) 0.4 MG CAPS capsule Take 1 capsule (0.4 mg total) by mouth daily after supper. 11/26/15   Silver Huguenin Elgergawy, MD  terbinafine (LAMISIL AT) 1 % cream Apply 1 application topically 2 (two) times daily. Bilateral feet 03/20/16   Venetia Maxon Rama, MD  vitamin B-12 (CYANOCOBALAMIN) 100 MCG tablet Take 100 mcg by mouth daily.    Historical Provider, MD  warfarin (COUMADIN) 2 MG tablet Take 2 mg by mouth at bedtime.    Historical Provider, MD    Physical Exam: Vitals:   05/08/16 1702 05/08/16 1745 05/08/16 1848 05/08/16 1915  BP:  179/93 164/95 164/87  Pulse:  76 76 67  Resp:  14 20 18   SpO2:  100% 100% 100%  Weight: 73.5 kg (162 lb)     Height: 5\' 9"  (1.753 m)         Constitutional: No acute distress, calm, appears uncomfortable, chronically-ill, disheveled Eyes: PERTLA, lids and conjunctivae normal ENMT: Mucous membranes are dry. Posterior pharynx clear of any exudate or lesions.   Neck: normal, supple, no  masses, no thyromegaly Respiratory: clear to auscultation bilaterally, no wheezing, no crackles. Normal respiratory effort.   Cardiovascular: Rate ~80 and irregular. No extremity edema. No significant JVD. Abdomen: No distension, no tenderness, no masses palpated. Bowel sounds normal.  Musculoskeletal: no clubbing / cyanosis. 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 Normal muscle tone.  Skin: Scattered ecchymoses about b/l UE's. Poor turgor. Chronic wound with eschar at lateral left foot; ulcer with proteinaceous exudate at medial aspect of first left MTP. Otherwise, skin is warm, dry, well-perfused. Neurologic: CN 2-12 grossly intact. Sensation intact, DTR normal. Strength 5/5 in all 4 limbs.  Psychiatric: Normal judgment and insight. Alert and oriented x 3. Anxious and tearful.     Labs on Admission: I have personally reviewed following labs and imaging studies  CBC:  Recent Labs Lab 05/08/16 1700  WBC 5.0  NEUTROABS 3.5  HGB 11.9*  HCT 35.6*  MCV 91.5  PLT 0000000   Basic Metabolic Panel:  Recent Labs Lab 05/08/16 1700  NA 140  K 2.7*  CL 111  CO2 20*  GLUCOSE 551*  BUN 47*  CREATININE 1.92*  CALCIUM 8.5*   GFR: Estimated Creatinine Clearance: 32.7 mL/min (by C-G formula based on SCr of 1.92 mg/dL (H)). Liver Function Tests:  Recent Labs Lab 05/08/16 1700  AST 26  ALT 11*  ALKPHOS 39  BILITOT 1.2  PROT 5.8*  ALBUMIN 2.5*   No results for input(s): LIPASE, AMYLASE in the last 168 hours. No results for input(s): AMMONIA in the last 168 hours. Coagulation Profile:  Recent Labs Lab 05/08/16 1700  INR 1.77   Cardiac Enzymes: No results for input(s): CKTOTAL, CKMB, CKMBINDEX, TROPONINI in the last 168 hours. BNP (last 3 results) No results for input(s): PROBNP in the last 8760 hours. HbA1C: No results for input(s): HGBA1C in the last 72 hours. CBG:  Recent Labs Lab 05/08/16 1648 05/08/16 1919  GLUCAP 517* 449*   Lipid Profile: No results for input(s):  CHOL, HDL, LDLCALC, TRIG, CHOLHDL, LDLDIRECT in the last 72 hours. Thyroid Function Tests: No results for input(s): TSH, T4TOTAL, FREET4, T3FREE, THYROIDAB in the last 72 hours. Anemia Panel: No results for input(s): VITAMINB12, FOLATE, FERRITIN, TIBC, IRON, RETICCTPCT in the last 72 hours. Urine analysis:    Component Value Date/Time   COLORURINE ORANGE (A) 02/12/2016 2243   APPEARANCEUR CLOUDY (A) 02/12/2016 2243   LABSPEC 1.017 02/12/2016 2243   PHURINE 5.5  02/12/2016 [place] 02/12/2016 2243   GLUCOSEU NEGATIVE 10/18/2012 0857   HGBUR MODERATE (A) 02/12/2016 2243   BILIRUBINUR SMALL (A) 02/12/2016 2243   KETONESUR NEGATIVE 02/12/2016 2243   PROTEINUR 100 (A) 02/12/2016 2243   UROBILINOGEN 0.2 10/18/2012 0857   NITRITE NEGATIVE 02/12/2016 2243   LEUKOCYTESUR LARGE (A) 02/12/2016 2243   Sepsis Labs: @LABRCNTIP (procalcitonin:4,lacticidven:4) )No results found for this or any previous visit (from the past 240 hour(s)).   Radiological Exams on Admission: No results found.  EKG: Independently reviewed. Atrial fibrillation, anterior Q-wave  Assessment/Plan  1. Hypokalemia  - Serum potassium 2.7 on admission  - Likely secondary to GI-losses (vomiting), and poor nutrition - He was given 40 mEq oral and 30 mEq IV potassium in ED  - 2 g IV magnesium given on admission  - Concern for worsening with treatment of hyperglycemia, will follow serial chem panels q6h x3 and monitor on telemetry    2. Hyperglycemia, insulin-dependent DM  - Pt follows with endocrinology and has had decreasing insulin-requirements, but outpatient management had been difficult as pt was not recording CBG's  - Blood glucose has been uncontrolled over the past 2 days, CBG 551 on admission despite being given 14 units Novolog PTA by home health RN  - Not in DKA/HHS  - No evidence for an infectious etiology; cultures taken in light of his immunosuppressed state  - Given the potassium of 2.7, he  was treated with potassium prior to insulin  - He was given Novolog 7 units sq x2 overnight following admission  - Check frequent CBG until normalizing  - Continue Lantus 5 units qHS with moderate-intensity sliding-scale correctional   3. CKD stage IV, s/p renal transplant  - SCr is 1.92 on admission; this appears close to baseline - He has AVF and been undergoing HD twice-weekly until recently taken off  - Continue Prograf and low-dose prednisone  - Renally-dose medications, avoid nephrotoxins    4. General weakness, malaise  - Possibly secondary to the electrolyte derangements  - No focal weakness or deficit - No evidence for infection, flu PCR negative, cultures pending - Correct hyperglycemia and hypokalemia as above  - PT eval requested    5. Chronic atrial fibrillation  - In rate-controlled a fib on admission  - CHADS-VASc at least 2 (age, DM) - Previously anticoagulated with warfarin before he was recently taken off d/t safety concerns given poor follow-up and recurrent need for vit K  - Continue metoprolol, aspirin   6. Normocytic anemia - Hgb is 11.9 on admission and stable relative to priors  - Continue B-12 supplementation   DVT prophylaxis: sq heparin  Code Status: Full  Family Communication: Daughter updated at bedside Disposition Plan: Observe on telemetry Consults called: None Admission status: Observation    Vianne Bulls, MD [facility] Pager [phone]  If 7PM-7AM, please contact night-coverage [email or web address] Password [facility]  05/08/2016, 8:29 PM

## 2016-05-08 NOTE — Telephone Encounter (Signed)
Agree with the 15 units of Novolog. Keep an eye on the sugars tonight. Call back with sugars on Monday. May need increase in mealtime Novolog/Lantus if they are still high.

## 2016-05-08 NOTE — Telephone Encounter (Signed)
[phone] [facility]  Pt just had a random BS 566 was the reading she did administer 15 u of the novolog sliding scale

## 2016-05-08 NOTE — ED Notes (Signed)
ED Provider at bedside. 

## 2016-05-09 DIAGNOSIS — E1110 Type 2 diabetes mellitus with ketoacidosis without coma: Secondary | ICD-10-CM | POA: Diagnosis present

## 2016-05-09 DIAGNOSIS — Z66 Do not resuscitate: Secondary | ICD-10-CM | POA: Diagnosis not present

## 2016-05-09 DIAGNOSIS — I272 Pulmonary hypertension, unspecified: Secondary | ICD-10-CM | POA: Diagnosis present

## 2016-05-09 DIAGNOSIS — B3741 Candidal cystitis and urethritis: Secondary | ICD-10-CM | POA: Diagnosis present

## 2016-05-09 DIAGNOSIS — D649 Anemia, unspecified: Secondary | ICD-10-CM | POA: Diagnosis present

## 2016-05-09 DIAGNOSIS — R5381 Other malaise: Secondary | ICD-10-CM

## 2016-05-09 DIAGNOSIS — E1122 Type 2 diabetes mellitus with diabetic chronic kidney disease: Secondary | ICD-10-CM | POA: Diagnosis not present

## 2016-05-09 DIAGNOSIS — R5383 Other fatigue: Secondary | ICD-10-CM

## 2016-05-09 DIAGNOSIS — E876 Hypokalemia: Secondary | ICD-10-CM | POA: Diagnosis not present

## 2016-05-09 DIAGNOSIS — N390 Urinary tract infection, site not specified: Secondary | ICD-10-CM

## 2016-05-09 DIAGNOSIS — L97519 Non-pressure chronic ulcer of other part of right foot with unspecified severity: Secondary | ICD-10-CM | POA: Diagnosis present

## 2016-05-09 DIAGNOSIS — M869 Osteomyelitis, unspecified: Secondary | ICD-10-CM | POA: Diagnosis present

## 2016-05-09 DIAGNOSIS — Z794 Long term (current) use of insulin: Secondary | ICD-10-CM | POA: Diagnosis not present

## 2016-05-09 DIAGNOSIS — E11621 Type 2 diabetes mellitus with foot ulcer: Secondary | ICD-10-CM | POA: Diagnosis present

## 2016-05-09 DIAGNOSIS — S91104A Unspecified open wound of right lesser toe(s) without damage to nail, initial encounter: Secondary | ICD-10-CM | POA: Diagnosis not present

## 2016-05-09 DIAGNOSIS — Z22322 Carrier or suspected carrier of Methicillin resistant Staphylococcus aureus: Secondary | ICD-10-CM | POA: Diagnosis not present

## 2016-05-09 DIAGNOSIS — N185 Chronic kidney disease, stage 5: Secondary | ICD-10-CM | POA: Diagnosis not present

## 2016-05-09 DIAGNOSIS — I482 Chronic atrial fibrillation: Secondary | ICD-10-CM | POA: Diagnosis not present

## 2016-05-09 DIAGNOSIS — R739 Hyperglycemia, unspecified: Secondary | ICD-10-CM | POA: Diagnosis not present

## 2016-05-09 DIAGNOSIS — E11649 Type 2 diabetes mellitus with hypoglycemia without coma: Secondary | ICD-10-CM | POA: Diagnosis present

## 2016-05-09 DIAGNOSIS — S91105A Unspecified open wound of left lesser toe(s) without damage to nail, initial encounter: Secondary | ICD-10-CM | POA: Diagnosis not present

## 2016-05-09 DIAGNOSIS — I1310 Hypertensive heart and chronic kidney disease without heart failure, with stage 1 through stage 4 chronic kidney disease, or unspecified chronic kidney disease: Secondary | ICD-10-CM | POA: Diagnosis present

## 2016-05-09 DIAGNOSIS — I5032 Chronic diastolic (congestive) heart failure: Secondary | ICD-10-CM | POA: Diagnosis present

## 2016-05-09 DIAGNOSIS — E1165 Type 2 diabetes mellitus with hyperglycemia: Secondary | ICD-10-CM | POA: Diagnosis not present

## 2016-05-09 DIAGNOSIS — N184 Chronic kidney disease, stage 4 (severe): Secondary | ICD-10-CM | POA: Diagnosis present

## 2016-05-09 DIAGNOSIS — Z940 Kidney transplant status: Secondary | ICD-10-CM | POA: Diagnosis not present

## 2016-05-09 DIAGNOSIS — E039 Hypothyroidism, unspecified: Secondary | ICD-10-CM | POA: Diagnosis not present

## 2016-05-09 DIAGNOSIS — E43 Unspecified severe protein-calorie malnutrition: Secondary | ICD-10-CM | POA: Diagnosis not present

## 2016-05-09 DIAGNOSIS — I472 Ventricular tachycardia: Secondary | ICD-10-CM | POA: Diagnosis not present

## 2016-05-09 DIAGNOSIS — Z6824 Body mass index (BMI) 24.0-24.9, adult: Secondary | ICD-10-CM | POA: Diagnosis not present

## 2016-05-09 DIAGNOSIS — E1140 Type 2 diabetes mellitus with diabetic neuropathy, unspecified: Secondary | ICD-10-CM | POA: Diagnosis present

## 2016-05-09 DIAGNOSIS — E1169 Type 2 diabetes mellitus with other specified complication: Secondary | ICD-10-CM | POA: Diagnosis present

## 2016-05-09 DIAGNOSIS — L97528 Non-pressure chronic ulcer of other part of left foot with other specified severity: Secondary | ICD-10-CM | POA: Diagnosis present

## 2016-05-09 DIAGNOSIS — M86672 Other chronic osteomyelitis, left ankle and foot: Secondary | ICD-10-CM | POA: Diagnosis not present

## 2016-05-09 DIAGNOSIS — Z992 Dependence on renal dialysis: Secondary | ICD-10-CM | POA: Diagnosis not present

## 2016-05-09 DIAGNOSIS — N186 End stage renal disease: Secondary | ICD-10-CM | POA: Diagnosis not present

## 2016-05-09 DIAGNOSIS — R531 Weakness: Secondary | ICD-10-CM | POA: Diagnosis not present

## 2016-05-09 DIAGNOSIS — E1152 Type 2 diabetes mellitus with diabetic peripheral angiopathy with gangrene: Secondary | ICD-10-CM | POA: Diagnosis present

## 2016-05-09 DIAGNOSIS — I4891 Unspecified atrial fibrillation: Secondary | ICD-10-CM | POA: Diagnosis not present

## 2016-05-09 LAB — URINALYSIS, ROUTINE W REFLEX MICROSCOPIC
BILIRUBIN URINE: NEGATIVE
Glucose, UA: >=500 mg/dL — AB
KETONES UR: NEGATIVE mg/dL
Nitrite: NEGATIVE
Protein, ur: 30 mg/dL — AB
Specific Gravity, Urine: 1.011 (ref 1.005–1.030)
pH: 5 (ref 5.0–8.0)

## 2016-05-09 LAB — BASIC METABOLIC PANEL
Anion gap: 6 (ref 5–15)
Anion gap: 6 (ref 5–15)
BUN: 39 mg/dL — ABNORMAL HIGH (ref 6–20)
BUN: 37 mg/dL — ABNORMAL HIGH (ref 6–20)
CALCIUM: 8.6 mg/dL — AB (ref 8.9–10.3)
CHLORIDE: 118 mmol/L — AB (ref 101–111)
CHLORIDE: 118 mmol/L — AB (ref 101–111)
CO2: 18 mmol/L — AB (ref 22–32)
CO2: 20 mmol/L — ABNORMAL LOW (ref 22–32)
CREATININE: 1.38 mg/dL — AB (ref 0.61–1.24)
Calcium: 8.4 mg/dL — ABNORMAL LOW (ref 8.9–10.3)
Creatinine, Ser: 1.45 mg/dL — ABNORMAL HIGH (ref 0.61–1.24)
GFR calc non Af Amer: 45 mL/min — ABNORMAL LOW (ref >60)
GFR calc non Af Amer: 48 mL/min — ABNORMAL LOW (ref >60)
GFR, EST AFRICAN AMERICAN: 52 mL/min — AB (ref >60)
GFR, EST AFRICAN AMERICAN: 56 mL/min — AB (ref >60)
Glucose, Bld: 320 mg/dL — ABNORMAL HIGH (ref 65–99)
Glucose, Bld: 125 mg/dL — ABNORMAL HIGH (ref 65–99)
POTASSIUM: 3.1 mmol/L — AB (ref 3.5–5.1)
Potassium: 3.3 mmol/L — ABNORMAL LOW (ref 3.5–5.1)
SODIUM: 144 mmol/L (ref 135–145)
Sodium: 142 mmol/L (ref 135–145)

## 2016-05-09 LAB — CBC WITH DIFFERENTIAL/PLATELET
Basophils Absolute: 0 10*3/uL (ref 0.0–0.1)
Basophils Relative: 0 %
EOS ABS: 0 10*3/uL (ref 0.0–0.7)
Eosinophils Relative: 0 %
HEMATOCRIT: 31.5 % — AB (ref 39.0–52.0)
HEMOGLOBIN: 10.9 g/dL — AB (ref 13.0–17.0)
LYMPHS ABS: 1.3 10*3/uL (ref 0.7–4.0)
LYMPHS PCT: 29 %
MCH: 31.5 pg (ref 26.0–34.0)
MCHC: 34.6 g/dL (ref 30.0–36.0)
MCV: 91 fL (ref 78.0–100.0)
MONOS PCT: 8 %
Monocytes Absolute: 0.4 10*3/uL (ref 0.1–1.0)
NEUTROS PCT: 63 %
Neutro Abs: 2.8 10*3/uL (ref 1.7–7.7)
Platelets: 146 10*3/uL — ABNORMAL LOW (ref 150–400)
RBC: 3.46 MIL/uL — AB (ref 4.22–5.81)
RDW: 17.6 % — ABNORMAL HIGH (ref 11.5–15.5)
WBC: 4.5 10*3/uL (ref 4.0–10.5)

## 2016-05-09 LAB — GLUCOSE, CAPILLARY
GLUCOSE-CAPILLARY: 318 mg/dL — AB (ref 65–99)
GLUCOSE-CAPILLARY: 125 mg/dL — AB (ref 65–99)
Glucose-Capillary: 152 mg/dL — ABNORMAL HIGH (ref 65–99)
Glucose-Capillary: 179 mg/dL — ABNORMAL HIGH (ref 65–99)
Glucose-Capillary: 218 mg/dL — ABNORMAL HIGH (ref 65–99)
Glucose-Capillary: 315 mg/dL — ABNORMAL HIGH (ref 65–99)
Glucose-Capillary: 376 mg/dL — ABNORMAL HIGH (ref 65–99)

## 2016-05-09 LAB — MAGNESIUM: Magnesium: 2 mg/dL (ref 1.7–2.4)

## 2016-05-09 LAB — MRSA PCR SCREENING: MRSA by PCR: POSITIVE — AB

## 2016-05-09 MED ORDER — DEXTROSE 5 % IV SOLN
1.0000 g | INTRAVENOUS | Status: DC
  Administered 2016-05-09 – 2016-05-11 (×3): 1 g via INTRAVENOUS
  Filled 2016-05-09 (×3): qty 10

## 2016-05-09 MED ORDER — POTASSIUM CHLORIDE CRYS ER 20 MEQ PO TBCR
40.0000 meq | EXTENDED_RELEASE_TABLET | Freq: Once | ORAL | Status: AC
  Administered 2016-05-09: 40 meq via ORAL
  Filled 2016-05-09: qty 2

## 2016-05-09 MED ORDER — INSULIN ASPART 100 UNIT/ML ~~LOC~~ SOLN
7.0000 [IU] | Freq: Once | SUBCUTANEOUS | Status: AC
  Administered 2016-05-09: 7 [IU] via SUBCUTANEOUS

## 2016-05-09 NOTE — Evaluation (Signed)
Physical Therapy Evaluation Patient Details Name: BEUFORD ROMANOWSKI MRN: [id number] DOB: 12/17/1940 Today's Date: 05/09/2016   History of Present Illness  76 yo male admitted with hypokalemia, hyperglycemia. Hx of L necrotic foot wound, gout, A fib, kidney transplant, L toe amputation, DM, HTN, CHF, CKD, neuropathy, MRSA  Clinical Impression  On eval, pt required Total assist +2 for bed mobility. Pt sat EOB ~2 minutes with Min assist for static sitting balance. Max encouragement needed for pt participation. Pt is not motivated to put forth effort for any tasks. No family present during session. Recommend SNF. Will follow and progress activity as able.     Follow Up Recommendations SNF    Equipment Recommendations  None recommended by PT    Recommendations for Other Services OT consult     Precautions / Restrictions Precautions Precautions: Fall Required Braces or Orthoses: Other Brace/Splint (L DARCO shoe per chart and pt (not in room on evaluation))      Mobility  Bed Mobility Overal bed mobility: Needs Assistance Bed Mobility: Supine to Sit;Sit to Supine     Supine to sit: Total assist;+2 for physical assistance;+2 for safety/equipment;HOB elevated Sit to supine: Total assist;+2 for physical assistance;+2 for safety/equipment;HOB elevated   General bed mobility comments: Assist for trunk and bil LEs. Utilized bedpad for scooting, positioning. Increased time and encouragement required.   Transfers                 General transfer comment: NT-pt unwilling and DARCO shoe not in room  Ambulation/Gait                Stairs            Wheelchair Mobility    Modified Rankin (Stroke Patients Only)       Balance Overall balance assessment: Needs assistance   Sitting balance-Leahy Scale: Poor Sitting balance - Comments: Sat EOB for ~2 minutes with pt required external assist to maintain static sitting balance.  Postural control: Posterior lean                                    Pertinent Vitals/Pain Pain Assessment: Faces Faces Pain Scale: Hurts whole lot Pain Location: neck, LEs with activity Pain Descriptors / Indicators: Grimacing;Guarding;Sore;Tender Pain Intervention(s): Limited activity within patient's tolerance;Repositioned    Home Living Family/patient expects to be discharged to:: Skilled nursing facility Living Arrangements: Children                    Prior Function Level of Independence: Needs assistance   Gait / Transfers Assistance Needed: transfers to wheelchair. uses wheelchair for mobility           Hand Dominance        Extremity/Trunk Assessment   Upper Extremity Assessment Upper Extremity Assessment: Generalized weakness    Lower Extremity Assessment Lower Extremity Assessment: Generalized weakness (unable to accurately assess due to lack of pt cooperation)    Cervical / Trunk Assessment Cervical / Trunk Assessment: Kyphotic  Communication   Communication: No difficulties  Cognition Arousal/Alertness: Awake/alert Behavior During Therapy: WFL for tasks assessed/performed Overall Cognitive Status: Within Functional Limits for tasks assessed                      General Comments      Exercises     Assessment/Plan    PT Assessment Patient needs continued PT services  PT  Problem List Decreased strength;Decreased mobility;Decreased activity tolerance;Decreased balance;Decreased knowledge of use of DME;Pain;Decreased skin integrity          PT Treatment Interventions Therapeutic activities;Therapeutic exercise;Patient/family education;Functional mobility training;Balance training;DME instruction    PT Goals (Current goals can be found in the Care Plan section)  Acute Rehab PT Goals Patient Stated Goal: none stated PT Goal Formulation: With patient Time For Goal Achievement: 05/23/16 Potential to Achieve Goals: Fair    Frequency Min 3X/week    Barriers to discharge        Co-evaluation               End of Session   Activity Tolerance: Patient limited by pain;Patient limited by fatigue Patient left: in bed;with call bell/phone within reach;with bed alarm set      Functional Assessment Tool Used: clinical judgement Functional Limitation: Mobility: Walking and moving around Mobility: Walking and Moving Around Current Status [id number]): At least 80 percent but less than 100 percent impaired, limited or restricted Mobility: Walking and Moving Around Goal Status [phone]): At least 40 percent but less than 60 percent impaired, limited or restricted    Time: [id number] PT Time Calculation (min) (ACUTE ONLY): 17 min   Charges:   PT Evaluation $PT Eval Moderate Complexity: 1 Procedure     PT G Codes:   PT G-Codes **NOT FOR INPATIENT CLASS** Functional Assessment Tool Used: clinical judgement Functional Limitation: Mobility: Walking and moving around Mobility: Walking and Moving Around Current Status [id number]): At least 80 percent but less than 100 percent impaired, limited or restricted Mobility: Walking and Moving Around Goal Status [phone]): At least 40 percent but less than 60 percent impaired, limited or restricted    Weston Anna, MPT Pager: [phone]

## 2016-05-09 NOTE — Progress Notes (Signed)
[facility]  PROGRESS NOTE  Mark [name] [id number] DOB: 10-14-1940 DOA: 05/08/2016 PCP: Rochel Brome, MD   Brief HPI:    76 y.o. male with medical history significant for insulin-dependent diabetes mellitus, hypertension, chronic atrial fibrillation, renal transplant in 2005 with chronic kidney disease stage IV, now presenting to the emergency department for evaluation of malaise, nausea, vomiting, and hyperglycemia. Patient describes a progressive decline in his overall health over the past year  Upon arrival to the ED, patient is found to be mildly hypertensive, but with vitals otherwise stable. EKG features in atrial fibrillation with anterior Q waves. Chemistry panels notable for a potassium of 2.7, bicarbonate of 20, BUN 47, creatinine 1.92, and glucose 551  Subjective   This morning patient continues to feel lethargic.   Assessment/Plan:     1. UTI- UA at the time of admission was abnormal, will obtain urine culture. Start ceftriaxone per pharmacy consultation. Will follow urine culture results. 2. Hyperglycemia, insulin-dependent diabetes mellitus-blood glucose has improved, continue Lantus 5 units daily at bedtime with moderate sliding scale insulin with NovoLog. 3. Hypokalemia-potassium is 3.3 this morning, will replace potassium and check BMP in a.m. 4. C K D stage IV, status post renal transplant- serum creatinine was 1.92 on admission, he has been undergoing hemodialysis twice weekly until recently taken off. Continue Prograf and low-dose prednisone. 5. Chronic atrial fibrillation- heart rate is controlled, CHA2DS2VASc score is 2. He was previously anticogulated with warfarin, before he was taken off due to safety concern given for follow-up and recurrent need for vitamin K. Continue metoprolol and aspirin 6. Normocytic anemia- hemoglobin is 11.9 on admission. Continue B12 supplementation.    DVT prophylaxis: Heparin  Code Status: *Full code   Family  Communication: *No family present at bedside  Disposition Plan: *Pending PT evaluation   Consultants:  None  Procedures:  None   Antibiotics:   Anti-infectives    Start     Dose/Rate Route Frequency Ordered Stop   05/09/16 1000  cefTRIAXone (ROCEPHIN) 1 g in dextrose 5 % 50 mL IVPB     1 g 100 mL/hr over 30 Minutes Intravenous Every 24 hours 05/09/16 0907         Objective   Vitals:   05/08/16 2133 05/09/16 0456 05/09/16 0515 05/09/16 0800  BP: (!) 153/83  (!) 150/90 (!) 154/75  Pulse: 67  (!) 45 82  Resp:   16 16  Temp: 97.7 F (36.5 C)  97.6 F (36.4 C) 97.5 F (36.4 C)  TempSrc: Oral  Oral Oral  SpO2: 100%  100% 98%  Weight:  73.4 kg (161 lb 13.1 oz) 76.4 kg (168 lb 6.9 oz)   Height:        Intake/Output Summary (Last 24 hours) at 05/09/16 1103 Last data filed at 05/09/16 1057  Gross per 24 hour  Intake             1597 ml  Output              600 ml  Net              997 ml   Filed Weights   05/08/16 1702 05/09/16 0456 05/09/16 0515  Weight: 73.5 kg (162 lb) 73.4 kg (161 lb 13.1 oz) 76.4 kg (168 lb 6.9 oz)     Physical Examination:  General exam: Appears calm and comfortable. Respiratory system: Clear to auscultation. Respiratory effort normal. Cardiovascular system:  RRR. No  murmurs, rubs, gallops. No pedal edema. GI system:  Abdomen is nondistended, soft and nontender. No organomegaly.  Central nervous system. No focal neurological deficits. 5 x 5 power in all extremities. Skin: No rashes, lesions or ulcers. Psychiatry: Alert, oriented x 3.Judgement and insight appear normal. Affect normal.    Data Reviewed: I have personally reviewed following labs and imaging studies  CBG:  Recent Labs Lab 05/08/16 2201 05/09/16 0020 05/09/16 0204 05/09/16 0510 05/09/16 0800  GLUCAP 436* 376* 318* 218* 125*    CBC:  Recent Labs Lab 05/08/16 1700 05/09/16 0226  WBC 5.0 4.5  NEUTROABS 3.5 2.8  HGB 11.9* 10.9*  HCT 35.6* 31.5*  MCV 91.5  91.0  PLT 153 146*    Basic Metabolic Panel:  Recent Labs Lab 05/08/16 1700 05/08/16 2145 05/09/16 0226 05/09/16 0850  NA 140 141 142 144  K 2.7* 3.2* 3.1* 3.3*  CL 111 114* 118* 118*  CO2 20* 20* 18* 20*  GLUCOSE 551* 459* 320* 125*  BUN 47* 45* 39* 37*  CREATININE 1.92* 1.81* 1.45* 1.38*  CALCIUM 8.5* 8.4* 8.4* 8.6*  MG  --   --  2.0  --     Recent Results (from the past 240 hour(s))  MRSA PCR Screening     Status: Abnormal   Collection Time: 05/08/16 11:56 PM  Result Value Ref Range Status   MRSA by PCR POSITIVE (A) NEGATIVE Final    Comment:        The GeneXpert MRSA Assay (FDA approved for NASAL specimens only), is one component of a comprehensive MRSA colonization surveillance program. It is not intended to diagnose MRSA infection nor to guide or monitor treatment for MRSA infections. RESULT CALLED TO, READ BACK BY AND VERIFIED WITH: OBRYANT,M RN 2.3.18 @0210  ZANDO,C      Liver Function Tests:  Recent Labs Lab 05/08/16 1700  AST 26  ALT 11*  ALKPHOS 39  BILITOT 1.2  PROT 5.8*  ALBUMIN 2.5*   No results for input(s): LIPASE, AMYLASE in the last 168 hours. No results for input(s): AMMONIA in the last 168 hours.  Cardiac Enzymes:  Recent Labs Lab 05/08/16 2145  TROPONINI <0.03      Studies: Dg Chest 1 View  Result Date: 05/08/2016 CLINICAL DATA:  Malaise and fatigue. Hyperglycemia. History of AFib, CHF, diabetes, hypertension. EXAM: CHEST 1 VIEW COMPARISON:  02/12/2016 FINDINGS: Patient has right-sided dialysis catheter, tip overlying level of the lower superior vena cava -upper right atrium. Heart size is upper normal. There is mild patchy density in the left lower lobe raising the question of early infiltrate. No pulmonary edema. IMPRESSION: Suspect early infiltrate in the left lower lobe. Electronically Signed   By: Nolon Nations M.D.   On: 05/08/2016 20:59    Scheduled Meds: . allopurinol  150 mg Oral Daily  . aspirin  81 mg Oral  QHS  . cefTRIAXone (ROCEPHIN)  IV  1 g Intravenous Q24H  . feeding supplement  1 Container Oral TID WC  . fenofibrate  160 mg Oral Daily  . heparin  5,000 Units Subcutaneous Q8H  . insulin aspart  0-15 Units Subcutaneous TID WC  . insulin aspart  0-5 Units Subcutaneous QHS  . insulin glargine  5 Units Subcutaneous QHS  . levothyroxine  125 mcg Oral QAC breakfast  . metoprolol succinate  50 mg Oral Daily  . [START ON 05/10/2016] multivitamin  1 tablet Oral QHS  . pantoprazole  40 mg Oral BID  . predniSONE  5 mg Oral Daily  . rosuvastatin  5 mg Oral  QPM  . sodium chloride flush  3 mL Intravenous Q12H  . tacrolimus  0.5 mg Oral BID  . tamsulosin  0.4 mg Oral QPC supper  . vitamin B-12  100 mcg Oral Daily      Time spent: 25 min  [facility] Pager [phone]. If 7PM-7AM, please contact night-coverage at [email or web address], Office  [phone]  password [email or web address] 05/09/2016, 11:03 AM  LOS: 0 days

## 2016-05-09 NOTE — Progress Notes (Signed)
Pharmacy Antibiotic Note  Mark [name] is a 76 y.o. male with diabetes mellitus, hypertension, chronic atrial fibrillation, renal transplant in 2005 with chronic kidney disease stage IV admitted on 05/08/2016 with hypokalemia, hyperglycemia, malaise and N/V.  Pharmacy has been consulted for ceftriaxone dosing for UTI.  Plan:  Ceftriaxone 1g IV q24h  No further dose adjustments needed, Pharmacy will sign-off  Height: 5\' 9"  (175.3 cm) Weight: 168 lb 6.9 oz (76.4 kg) IBW/kg (Calculated) : 70.7  Temp (24hrs), Avg:97.7 F (36.5 C), Min:97.6 F (36.4 C), Max:97.7 F (36.5 C)   Recent Labs Lab 05/08/16 1700 05/08/16 2145 05/09/16 0226  WBC 5.0  --  4.5  CREATININE 1.92* 1.81* 1.45*    Estimated Creatinine Clearance: 43.3 mL/min (by C-G formula based on SCr of 1.45 mg/dL (H)).    Allergies  Allergen Reactions  . Tape Other (See Comments)    SKIN WILL TEAR!!    Antimicrobials this admission: 2/3 Ceftriaxone >>  Dose adjustments this admission: ---  Microbiology results: 2/2 BCx: sent 2/2 MRSA PCR: positive 2/2 Influenza: negative 2/3 Urine cx: sent  Thank you for allowing pharmacy to be a part of this patient's care.  Peggyann Juba, PharmD, BCPS Pager: [phone] 05/09/2016 9:02 AM

## 2016-05-09 NOTE — Consult Note (Signed)
[facility] Nurse wound consult note Reason for Consult: RGT full thickness lesion, previous LGT amputation (well healed), left medial foot and left tip of second toe with dry eschar. Wound type: Infectious Pressure Injury POA: No Measurement: RGT with 1cm round full thickness wound, unable to assess depth due to the presence of a necrotic center.  Right great toe amputation site is well healed, but there is a 1cm round full thickness wound on the left medial foot at the bunion area covered with eschar.  There is another 1cm round wound covered with eschar located at the distal tip of the left second digit (hammer toe deformity). Wound bed:As described above Drainage (amount, consistency, odor) scant to small amount of serous drainage at the right great toe, all other ulcers are dry eschar. Periwound: Right great toe with erythema and warmth.  Patient denies pain or discomfort upon palpation. Dressing procedure/placement/frequency: The left foot will be treated with twice daily cleansings and application of a betadine swabstick for the purpose of drying and donating an antimicrobial to the two stable eschars. The RGT presents with inflammation and a full thickness ulceration that is draining a small amount of serous exudate.There is warmth and edema in this area, but no pain.  That said and given the history of amputation of the left great toe, I suggest consultation with Orthopedics for their impression and further recommendations for the POC.  As expected, I will defer to their expertise.  If you agree, please order consult. [facility] nursing team will not follow, but will remain available to this patient, the nursing and medical teams.  Please re-consult if needed. Thanks, Maudie Flakes, MSN, RN, [place], Arther Abbott  Pager# [phone]

## 2016-05-09 NOTE — Progress Notes (Signed)
Pt was lying in bed under covers, but awake, when Ch arrived. He was soft spoken and only wanted prayer. CH had prayer w/pt, for which he was grateful. [facility] visit was brief because, by his admission, pt was not feeling well. Please page if additional support is needed.  [place], M.Div.   05/09/16 1500  Clinical Encounter Type  Visited With Patient

## 2016-05-10 LAB — CBC
HCT: 34.4 % — ABNORMAL LOW (ref 39.0–52.0)
HEMOGLOBIN: 11.9 g/dL — AB (ref 13.0–17.0)
MCH: 31.3 pg (ref 26.0–34.0)
MCHC: 34.6 g/dL (ref 30.0–36.0)
MCV: 90.5 fL (ref 78.0–100.0)
Platelets: 134 10*3/uL — ABNORMAL LOW (ref 150–400)
RBC: 3.8 MIL/uL — ABNORMAL LOW (ref 4.22–5.81)
RDW: 18.1 % — ABNORMAL HIGH (ref 11.5–15.5)
WBC: 5.7 10*3/uL (ref 4.0–10.5)

## 2016-05-10 LAB — BASIC METABOLIC PANEL
ANION GAP: 6 (ref 5–15)
BUN: 35 mg/dL — AB (ref 6–20)
CALCIUM: 8.4 mg/dL — AB (ref 8.9–10.3)
CO2: 20 mmol/L — AB (ref 22–32)
Chloride: 117 mmol/L — ABNORMAL HIGH (ref 101–111)
Creatinine, Ser: 1.39 mg/dL — ABNORMAL HIGH (ref 0.61–1.24)
GFR calc Af Amer: 55 mL/min — ABNORMAL LOW (ref >60)
GFR calc non Af Amer: 48 mL/min — ABNORMAL LOW (ref >60)
GLUCOSE: 360 mg/dL — AB (ref 65–99)
Potassium: 3.9 mmol/L (ref 3.5–5.1)
Sodium: 143 mmol/L (ref 135–145)

## 2016-05-10 LAB — URINE CULTURE

## 2016-05-10 LAB — GLUCOSE, CAPILLARY
GLUCOSE-CAPILLARY: 234 mg/dL — AB (ref 65–99)
Glucose-Capillary: 228 mg/dL — ABNORMAL HIGH (ref 65–99)
Glucose-Capillary: 265 mg/dL — ABNORMAL HIGH (ref 65–99)

## 2016-05-10 MED ORDER — INSULIN GLARGINE 100 UNIT/ML ~~LOC~~ SOLN
10.0000 [IU] | Freq: Every day | SUBCUTANEOUS | Status: DC
  Administered 2016-05-10: 10 [IU] via SUBCUTANEOUS
  Filled 2016-05-10 (×2): qty 0.1

## 2016-05-10 NOTE — NC FL2 (Signed)
[place] LEVEL OF CARE SCREENING TOOL     IDENTIFICATION  Patient Name: Mark [name] Birthdate: [birth year] Sex: male Admission Date (Current Location): 05/08/2016  [facility] and [place] Number:  Herbalist and Address:  [facility]. [facility], [place] [address] [place], [place] [address]      Provider Number: [id number]  Attending Physician Name and Address:  Oswald Hillock, MD  Relative Name and Phone Number:       Current Level of Care: Hospital Recommended Level of Care: [place] Prior Approval Number:    Date Approved/Denied:   PASRR Number: [id number]  Discharge Plan: SNF    Current Diagnoses: Patient Active Problem List   Diagnosis Date Noted  . Urinary tract infection 05/09/2016  . Malaise and fatigue   . Hypokalemia 05/08/2016  . Hyperglycemia 05/08/2016  . Pressure injury of skin 03/18/2016  . 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 Dry gangrene ([place]) 03/17/2016  . Chronic atrial fibrillation ([place]) 03/17/2016  . ESRD on dialysis ([place]) 03/17/2016  . Chronic diastolic congestive heart failure ([place]) 03/17/2016  . BPH (benign prostatic hyperplasia) 03/17/2016  . Cellulitis of lower extremity   . Weight decrease   . Protein-calorie malnutrition, severe 02/14/2016  . Generalized weakness   . Renal transplant recipient 10/28/2015  . Diabetes mellitus with complication ([place]) [id number]  . Cellulitis of great toe of left foot 10/28/2015  . Diabetic peripheral neuropathy ([place]) 06/12/2014  . Hammer toe of right foot 02/16/2014  . Onychomycosis 02/02/2014  . Ulcer of right foot ([place]) 01/19/2014  . Ulcer of right ankle ([place]) 01/08/2014  . Ulcer of other part of foot 12/15/2013  . Type II diabetes mellitus, uncontrolled ([place]) 10/20/2012  . Other and unspecified hyperlipidemia 10/20/2012  . Hypothyroidism 10/20/2012  . Edema extremities 10/20/2012  . Gout 10/20/2012    Orientation RESPIRATION BLADDER Height & Weight      Self, Time, Situation, Place  Normal Continent Weight: 76.4 kg (168 lb 6.9 oz) Height:  5\' 9"  (175.3 cm)  BEHAVIORAL SYMPTOMS/MOOD NEUROLOGICAL BOWEL NUTRITION STATUS   (NONE)  (NONE) Incontinent Diet (Renal Carb Modified)  AMBULATORY STATUS COMMUNICATION OF NEEDS Skin   Extensive Assist Verbally Other (Comment) (wound right toe full thickness. wound left distal lateral foot and tip toes.)                       Personal Care Assistance Level of Assistance  Bathing, Dressing, Feeding Bathing Assistance: Limited assistance Feeding assistance: Independent Dressing Assistance: Limited assistance     Functional Limitations Info  Sight, Hearing, Speech Sight Info: Adequate Hearing Info: Adequate Speech Info: Adequate    SPECIAL CARE FACTORS FREQUENCY  PT (By licensed PT), OT (By licensed OT)     PT Frequency: 5/week OT Frequency: 5/week            Contractures Contractures Info: Not present    Additional Factors Info  Code Status, Allergies, Insulin Sliding Scale, Isolation Precautions Code Status Info: Full Code Allergies Info: Tape   Insulin Sliding Scale Info: 3/day Isolation Precautions Info: Contact MRSA by PCR     Current Medications (05/10/2016):  This is the current hospital active medication list Current Facility-Administered Medications  Medication Dose Route Frequency Provider Last Rate Last Dose  . acetaminophen (TYLENOL) tablet 650 mg  650 mg Oral Q6H PRN Vianne Bulls, MD      . allopurinol (ZYLOPRIM) tablet 150 mg  150 mg Oral Daily Christia Reading  S Opyd, MD   150 mg at 05/10/16 0950  . aspirin chewable tablet 81 mg  81 mg Oral QHS Vianne Bulls, MD   81 mg at 05/10/16 0042  . cefTRIAXone (ROCEPHIN) 1 g in dextrose 5 % 50 mL IVPB  1 g Intravenous Q24H Emiliano Dyer, RPH   1 g at 05/10/16 0951  . feeding supplement (BOOST / RESOURCE BREEZE) liquid 1 Container  1 Container Oral TID WC Oswald Hillock, MD   1 Container at 05/09/16 1251  . fenofibrate tablet  160 mg  160 mg Oral Daily Vianne Bulls, MD   160 mg at 05/10/16 0949  . heparin injection 5,000 Units  5,000 Units Subcutaneous Q8H Vianne Bulls, MD   5,000 Units at 05/10/16 0600  . HYDROcodone-acetaminophen (NORCO/VICODIN) 5-325 MG per tablet 1-2 tablet  1-2 tablet Oral Q4H PRN Ilene Qua Opyd, MD      . insulin aspart (novoLOG) injection 0-15 Units  0-15 Units Subcutaneous TID WC Vianne Bulls, MD   5 Units at 05/10/16 1212  . insulin aspart (novoLOG) injection 0-5 Units  0-5 Units Subcutaneous QHS Vianne Bulls, MD   4 Units at 05/10/16 0044  . insulin glargine (LANTUS) injection 10 Units  10 Units Subcutaneous QHS Oswald Hillock, MD      . ipratropium-albuterol (DUONEB) 0.5-2.5 (3) MG/3ML nebulizer solution 3 mL  3 mL Nebulization Q4H PRN Vianne Bulls, MD      . levothyroxine (SYNTHROID, LEVOTHROID) tablet 125 mcg  125 mcg Oral QAC breakfast Vianne Bulls, MD   125 mcg at 05/10/16 0844  . metoprolol succinate (TOPROL-XL) 24 hr tablet 50 mg  50 mg Oral Daily Vianne Bulls, MD   50 mg at 05/10/16 0950  . multivitamin (RENA-VIT) tablet 1 tablet  1 tablet Oral QHS Timothy S Opyd, MD      . ondansetron (ZOFRAN) tablet 4 mg  4 mg Oral Q6H PRN Vianne Bulls, MD       Or  . ondansetron (ZOFRAN) injection 4 mg  4 mg Intravenous Q6H PRN Ilene Qua Opyd, MD      . pantoprazole (PROTONIX) EC tablet 40 mg  40 mg Oral BID Vianne Bulls, MD   40 mg at 05/10/16 0949  . predniSONE (DELTASONE) tablet 5 mg  5 mg Oral Daily Vianne Bulls, MD   5 mg at 05/10/16 0949  . rosuvastatin (CRESTOR) tablet 5 mg  5 mg Oral QPM Vianne Bulls, MD   5 mg at 05/09/16 1943  . sodium chloride flush (NS) 0.9 % injection 3 mL  3 mL Intravenous Q12H Vianne Bulls, MD   3 mL at 05/10/16 0950  . tacrolimus (PROGRAF) capsule 0.5 mg  0.5 mg Oral BID Vianne Bulls, MD   0.5 mg at 05/10/16 0950  . tamsulosin (FLOMAX) capsule 0.4 mg  0.4 mg Oral QPC supper Vianne Bulls, MD   0.4 mg at 05/09/16 1942  . vitamin B-12  (CYANOCOBALAMIN) tablet 100 mcg  100 mcg Oral Daily Vianne Bulls, MD   100 mcg at 05/10/16 [id number]     Discharge Medications: Please see discharge summary for a list of discharge medications.  Relevant Imaging Results:  Relevant Lab Results:   Additional Information SS# [id number].   Rigoberto Noel, LCSW

## 2016-05-10 NOTE — Progress Notes (Signed)
CTM reports that pt had a 16 beat run of V-tach and then returned to Afib rate in the 90's.  We were in the room turning pt when this occurred.  Pt is asymptomatic and vss.  Pt was having a BM at the time also.  His last K= was 3.3 and Mag was 2.0.  Is for a BMP in the morning.  On call notified.  Will continue to monitor

## 2016-05-10 NOTE — Progress Notes (Signed)
Initial Nutrition Assessment  INTERVENTION:   Continue Boost Breeze po TID, each supplement provides 250 kcal and 9 grams of protein Encourage PO intake RD to continue to monitor  NUTRITION DIAGNOSIS:   Increased nutrient needs related to wound healing as evidenced by estimated needs.  GOAL:   Patient will meet greater than or equal to 90% of their needs  MONITOR:   PO intake, Supplement acceptance, Labs, Weight trends, Skin, I & O's  REASON FOR ASSESSMENT:   Malnutrition Screening Tool    ASSESSMENT:   76 y.o. male with medical history significant for insulin-dependent diabetes mellitus, hypertension, chronic atrial fibrillation, renal transplant in 2005 with chronic kidney disease stage IV, now presenting to the emergency department for evaluation of malaise, nausea, vomiting, and hyperglycemia. Patient describes a progressive decline in his overall health over the past year  Patient in room with no family at bedside. Pt reports poor appetite for the past year. Pt states he is no longer receiving HD. Pt has been subsisting on soups and other various liquids PTA. Pt likes peach Boost Breeze and requests only this flavor. Pt lethargic during visit. Pt has not eaten yet today.  Per chart review, pt's weight has remained stable since September 2017. Nutrition-Focused physical exam completed. Findings are no fat depletion, moderate muscle depletion, and no edema.   Medications: Rena-Vit tablet daily, Protonix tablet BID, vitamin B-12 tablet daily Labs reviewed: CBGs: [id number] GFR: 48  Diet Order:  Diet renal/carb modified with fluid restriction Diet-HS Snack? Nothing; Room service appropriate? Yes; Fluid consistency: Thin  Skin:  Wound (see comment) (2/3 Lt foot wound)  Last BM:  2/3  Height:   Ht Readings from Last 1 Encounters:  05/08/16 5\' 9"  (1.753 m)    Weight:   Wt Readings from Last 1 Encounters:  05/09/16 168 lb 6.9 oz (76.4 kg)    Ideal Body Weight:  72.7  kg  BMI:  Body mass index is 24.87 kg/m.  Estimated Nutritional Needs:   Kcal:  1900-2100  Protein:  90-100g  Fluid:  2L/day  EDUCATION NEEDS:   No education needs identified at this time  Clayton Bibles, MS, RD, LDN Pager: [phone] After Hours Pager: [phone]

## 2016-05-10 NOTE — Clinical Social Work Placement (Signed)
   CLINICAL SOCIAL WORK PLACEMENT  NOTE  Date:  05/10/2016  Patient Details  Name: YANUEL LAPLANTE MRN: [id number] Date of Birth: 12-Sep-1940  Clinical Social Work is seeking post-discharge placement for this patient at the [place] level of care (*CSW will initial, date and re-position this form in  chart as items are completed):  Yes   Patient/family provided with [place] Work Department's list of facilities offering this level of care within the geographic area requested by the patient (or if unable, by the patient's family).  Yes   Patient/family informed of their freedom to choose among providers that offer the needed level of care, that participate in Medicare, Medicaid or managed care program needed by the patient, have an available bed and are willing to accept the patient.  Yes   Patient/family informed of [facility]'s ownership interest in [facility] and [facility], as well as of the fact that they are under no obligation to receive care at these facilities.  PASRR submitted to EDS on 05/10/16     PASRR number received on 05/10/16     Existing PASRR number confirmed on       FL2 transmitted to all facilities in geographic area requested by pt/family on 05/10/16     FL2 transmitted to all facilities within larger geographic area on       Patient informed that his/her managed care company has contracts with or will negotiate with certain facilities, including the following:            Patient/family informed of bed offers received.  Patient chooses bed at       Physician recommends and patient chooses bed at      Patient to be transferred to   on  .  Patient to be transferred to facility by       Patient family notified on   of transfer.  Name of family member notified:        PHYSICIAN Please prepare priority discharge summary, including medications, Please prepare prescriptions, Please sign FL2     Additional  Comment:    _______________________________________________ Rigoberto Noel, LCSW 05/10/2016, 2:38 PM

## 2016-05-10 NOTE — Clinical Social Work Note (Signed)
Clinical Social Work Assessment  Patient Details  Name: Mark [name] MRN: [id number] Date of Birth: 1941-04-06  Date of referral:  05/10/16               Reason for consult:  Facility Placement, Discharge Planning                Permission sought to share information with:  Facility Sport and exercise psychologist, Family Supports Permission granted to share information::  No  Name::     The patient states he will be the one that contacts his daughter.  Agency::  SNFs  Relationship::     Contact Information:     Housing/Transportation Living arrangements for the past 2 months:  Single Family Home Source of Information:  Patient Patient Interpreter Needed:  None Criminal Activity/Legal Involvement Pertinent to Current Situation/Hospitalization:  No - Comment as needed Significant Relationships:  Adult Children Lives with:  Adult Children Do you feel safe going back to the place where you live?  Yes Need for family participation in patient care:  Yes (Comment)  Care giving concerns:  The patient reports that he would like to discharge to a SNF when he leaves the hospital.   Social Worker assessment / plan:  CSW met with the patient at bedside to complete assessment. The patient shares that he is from home with his daughter. The patient appears withdrawn and is very limited in his engagement with CSW. The patient either answers yes or no, or doesn't speak at all. CSW explained recommendation for SNF to the patient and the patient says that he is agreeable to placement. CSW explained SNF search/placement process. CSW will followup with available bed offers.  Employment status:  Retired Forensic scientist:  [employer] PT Recommendations:  [place] / Referral to community resources:  [place]  Patient/Family's Response to care:  The patient appears happy with the care he has received and appreciates CSW's assistance with  placement.  Patient/Family's Understanding of and Emotional Response to Diagnosis, Current Treatment, and Prognosis:  The patient appears withdrawn and depressed. He states that he is very tired and cold. CSW requested warm blanket from NT. The patient appears to have fair understanding of his diagnosis and post DC needs.   Emotional Assessment Appearance:  Appears stated age Attitude/Demeanor/Rapport:  Guarded, Lethargic Affect (typically observed):  Blunt, Depressed, Flat, Guarded, Withdrawn Orientation:  Oriented to Place, Oriented to Self, Oriented to  Time, Oriented to Situation Alcohol / Substance use:  Not Applicable Psych involvement (Current and /or in the community):  No (Comment)  Discharge Needs  Concerns to be addressed:  Care Coordination, Discharge Planning Concerns Readmission within the last 30 days:  No Current discharge risk:  Physical Impairment, Chronically ill Barriers to Discharge:  Continued Medical Work up   [place], LCSW 05/10/2016, 2:22 PM

## 2016-05-10 NOTE — Progress Notes (Signed)
[facility]  PROGRESS NOTE  Mark [name] [id number] DOB: May 18, 1940 DOA: 05/08/2016 PCP: Rochel Brome, MD   Brief HPI:    76 y.o. male with medical history significant for insulin-dependent diabetes mellitus, hypertension, chronic atrial fibrillation, renal transplant in 2005 with chronic kidney disease stage IV, now presenting to the emergency department for evaluation of malaise, nausea, vomiting, and hyperglycemia. Patient describes a progressive decline in his overall health over the past year  Upon arrival to the ED, patient is found to be mildly hypertensive, but with vitals otherwise stable. EKG features in atrial fibrillation with anterior Q waves. Chemistry panels notable for a potassium of 2.7, bicarbonate of 20, BUN 47, creatinine 1.92, and glucose 551  Subjective   This morning patient continues to feel lethargic.   Assessment/Plan:     1. UTI- UA at the time of admission was abnormal,l urine culture obtained. Started on  ceftriaxone per pharmacy consultation. Will follow urine culture results. 2. Hyperglycemia, insulin-dependent diabetes mellitus-blood glucose  Is elevated,  Will increase  Lantus to 10 units daily at bedtime with moderate sliding scale insulin with NovoLog. 3. Hypokalemia- replete, potassium is 3.9 today. 4. C K D stage IV, status post renal transplant- serum creatinine was 1.92 on admission, he has been undergoing hemodialysis twice weekly until recently taken off. Continue Prograf and low-dose prednisone. 5. Chronic atrial fibrillation- heart rate is controlled, CHA2DS2VASc score is 2. He was previously anticogulated with warfarin, before he was taken off due to safety concern given for follow-up and recurrent need for vitamin K. Continue metoprolol and aspirin 6. Normocytic anemia- hemoglobin is 11.9 on admission. Continue B12 supplementation.    DVT prophylaxis: Heparin  Code Status: *Full code   Family Communication: *No family present at  bedside  Disposition Plan: *PT recommending SNF   Consultants:  None  Procedures:  None   Antibiotics:   Anti-infectives    Start     Dose/Rate Route Frequency Ordered Stop   05/09/16 1000  cefTRIAXone (ROCEPHIN) 1 g in dextrose 5 % 50 mL IVPB     1 g 100 mL/hr over 30 Minutes Intravenous Every 24 hours 05/09/16 0907         Objective   Vitals:   05/09/16 1530 05/09/16 1645 05/09/16 2238 05/10/16 0408  BP: (!) 145/64  134/75 (!) 153/79  Pulse: (!) 46 92 (!) 115 84  Resp: 16  (!) 26   Temp: 98 F (36.7 C)  97.9 F (36.6 C) 97.8 F (36.6 C)  TempSrc: Oral  Axillary Oral  SpO2: 100%  100% 100%  Weight:      Height:        Intake/Output Summary (Last 24 hours) at 05/10/16 1322 Last data filed at 05/10/16 1209  Gross per 24 hour  Intake             1078 ml  Output             1401 ml  Net             -323 ml   Filed Weights   05/08/16 1702 05/09/16 0456 05/09/16 0515  Weight: 73.5 kg (162 lb) 73.4 kg (161 lb 13.1 oz) 76.4 kg (168 lb 6.9 oz)     Physical Examination:  General exam: Appears calm and comfortable. Respiratory system: Clear to auscultation. Respiratory effort normal. Cardiovascular system:  RRR. No  murmurs, rubs, gallops. No pedal edema. GI system: Abdomen is nondistended, soft and nontender. No organomegaly.  Central nervous system.  No focal neurological deficits. 5 x 5 power in all extremities. Skin: No rashes, lesions or ulcers. Psychiatry: Alert, oriented x 3.Judgement and insight appear normal. Affect normal.    Data Reviewed: I have personally reviewed following labs and imaging studies  CBG:  Recent Labs Lab 05/09/16 0800 05/09/16 1230 05/09/16 1647 05/09/16 2236 05/10/16 1159  GLUCAP 125* 152* 179* 315* 234*    CBC:  Recent Labs Lab 05/08/16 1700 05/09/16 0226 05/10/16 0513  WBC 5.0 4.5 5.7  NEUTROABS 3.5 2.8  --   HGB 11.9* 10.9* 11.9*  HCT 35.6* 31.5* 34.4*  MCV 91.5 91.0 90.5  PLT 153 146* 134*    Basic  Metabolic Panel:  Recent Labs Lab 05/08/16 1700 05/08/16 2145 05/09/16 0226 05/09/16 0850 05/10/16 0513  NA 140 141 142 144 143  K 2.7* 3.2* 3.1* 3.3* 3.9  CL 111 114* 118* 118* 117*  CO2 20* 20* 18* 20* 20*  GLUCOSE 551* 459* 320* 125* 360*  BUN 47* 45* 39* 37* 35*  CREATININE 1.92* 1.81* 1.45* 1.38* 1.39*  CALCIUM 8.5* 8.4* 8.4* 8.6* 8.4*  MG  --   --  2.0  --   --     Recent Results (from the past 240 hour(s))  Culture, blood (routine x 2)     Status: None (Preliminary result)   Collection Time: 05/08/16  9:45 PM  Result Value Ref Range Status   Specimen Description BLOOD RIGHT HAND  Final   Special Requests BOTTLES DRAWN AEROBIC ONLY 5CC  Final   Culture   Final    NO GROWTH 1 DAY Performed at [facility], [address], [place], [place] [address]    Report Status PENDING  Incomplete  Culture, blood (routine x 2)     Status: None (Preliminary result)   Collection Time: 05/08/16  9:45 PM  Result Value Ref Range Status   Specimen Description BLOOD RIGHT HAND  Final   Special Requests IN PEDIATRIC BOTTLE 2CC  Final   Culture   Final    NO GROWTH 1 DAY Performed at [facility], [place] [address], [place], [place] [address]    Report Status PENDING  Incomplete  MRSA PCR Screening     Status: Abnormal   Collection Time: 05/08/16 11:56 PM  Result Value Ref Range Status   MRSA by PCR POSITIVE (A) NEGATIVE Final    Comment:        The GeneXpert MRSA Assay (FDA approved for NASAL specimens only), is one component of a comprehensive MRSA colonization surveillance program. It is not intended to diagnose MRSA infection nor to guide or monitor treatment for MRSA infections. RESULT CALLED TO, READ BACK BY AND VERIFIED WITH: OBRYANT,M RN 2.3.18 @0210  ZANDO,C      Liver Function Tests:  Recent Labs Lab 05/08/16 1700  AST 26  ALT 11*  ALKPHOS 39  BILITOT 1.2  PROT 5.8*  ALBUMIN 2.5*   No results for input(s): LIPASE, AMYLASE in the last 168  hours. No results for input(s): AMMONIA in the last 168 hours.  Cardiac Enzymes:  Recent Labs Lab 05/08/16 2145  TROPONINI <0.03      Studies: Dg Chest 1 View  Result Date: 05/08/2016 CLINICAL DATA:  Malaise and fatigue. Hyperglycemia. History of AFib, CHF, diabetes, hypertension. EXAM: CHEST 1 VIEW COMPARISON:  02/12/2016 FINDINGS: Patient has right-sided dialysis catheter, tip overlying level of the lower superior vena cava -upper right atrium. Heart size is upper normal. There is mild patchy density  in the left lower lobe raising the question of early infiltrate. No pulmonary edema. IMPRESSION: Suspect early infiltrate in the left lower lobe. Electronically Signed   By: Nolon Nations M.D.   On: 05/08/2016 20:59    Scheduled Meds: . allopurinol  150 mg Oral Daily  . aspirin  81 mg Oral QHS  . cefTRIAXone (ROCEPHIN)  IV  1 g Intravenous Q24H  . feeding supplement  1 Container Oral TID WC  . fenofibrate  160 mg Oral Daily  . heparin  5,000 Units Subcutaneous Q8H  . insulin aspart  0-15 Units Subcutaneous TID WC  . insulin aspart  0-5 Units Subcutaneous QHS  . insulin glargine  5 Units Subcutaneous QHS  . levothyroxine  125 mcg Oral QAC breakfast  . metoprolol succinate  50 mg Oral Daily  . multivitamin  1 tablet Oral QHS  . pantoprazole  40 mg Oral BID  . predniSONE  5 mg Oral Daily  . rosuvastatin  5 mg Oral QPM  . sodium chloride flush  3 mL Intravenous Q12H  . tacrolimus  0.5 mg Oral BID  . tamsulosin  0.4 mg Oral QPC supper  . vitamin B-12  100 mcg Oral Daily      Time spent: 25 min  [facility] Pager [phone]. If 7PM-7AM, please contact night-coverage at [email or web address], Office  [phone]  password [email or web address] 05/10/2016, 1:22 PM  LOS: 1 day

## 2016-05-11 ENCOUNTER — Encounter (HOSPITAL_COMMUNITY): Payer: Self-pay | Admitting: Cardiology

## 2016-05-11 DIAGNOSIS — I472 Ventricular tachycardia: Secondary | ICD-10-CM

## 2016-05-11 DIAGNOSIS — I739 Peripheral vascular disease, unspecified: Secondary | ICD-10-CM | POA: Diagnosis present

## 2016-05-11 DIAGNOSIS — R000 Tachycardia, unspecified: Secondary | ICD-10-CM

## 2016-05-11 DIAGNOSIS — I482 Chronic atrial fibrillation: Secondary | ICD-10-CM

## 2016-05-11 LAB — GLUCOSE, CAPILLARY
GLUCOSE-CAPILLARY: 355 mg/dL — AB (ref 65–99)
GLUCOSE-CAPILLARY: 277 mg/dL — AB (ref 65–99)
GLUCOSE-CAPILLARY: 220 mg/dL — AB (ref 65–99)
Glucose-Capillary: 338 mg/dL — ABNORMAL HIGH (ref 65–99)
Glucose-Capillary: 436 mg/dL — ABNORMAL HIGH (ref 65–99)

## 2016-05-11 LAB — CBC
HCT: 35.1 % — ABNORMAL LOW (ref 39.0–52.0)
Hemoglobin: 11.5 g/dL — ABNORMAL LOW (ref 13.0–17.0)
MCH: 29.9 pg (ref 26.0–34.0)
MCHC: 32.8 g/dL (ref 30.0–36.0)
MCV: 91.2 fL (ref 78.0–100.0)
Platelets: 139 10*3/uL — ABNORMAL LOW (ref 150–400)
RBC: 3.85 MIL/uL — AB (ref 4.22–5.81)
RDW: 18.5 % — ABNORMAL HIGH (ref 11.5–15.5)
WBC: 5.3 10*3/uL (ref 4.0–10.5)

## 2016-05-11 LAB — COMPREHENSIVE METABOLIC PANEL
ALBUMIN: 2.5 g/dL — AB (ref 3.5–5.0)
ALT: 10 U/L — AB (ref 17–63)
ANION GAP: 6 (ref 5–15)
AST: 18 U/L (ref 15–41)
Alkaline Phosphatase: 38 U/L (ref 38–126)
BUN: 39 mg/dL — ABNORMAL HIGH (ref 6–20)
CHLORIDE: 116 mmol/L — AB (ref 101–111)
CO2: 21 mmol/L — AB (ref 22–32)
Calcium: 8.5 mg/dL — ABNORMAL LOW (ref 8.9–10.3)
Creatinine, Ser: 1.56 mg/dL — ABNORMAL HIGH (ref 0.61–1.24)
GFR calc non Af Amer: 41 mL/min — ABNORMAL LOW (ref >60)
GFR, EST AFRICAN AMERICAN: 48 mL/min — AB (ref >60)
Glucose, Bld: 368 mg/dL — ABNORMAL HIGH (ref 65–99)
Potassium: 3.9 mmol/L (ref 3.5–5.1)
SODIUM: 143 mmol/L (ref 135–145)
Total Bilirubin: 0.9 mg/dL (ref 0.3–1.2)
Total Protein: 5.4 g/dL — ABNORMAL LOW (ref 6.5–8.1)

## 2016-05-11 LAB — TROPONIN I

## 2016-05-11 MED ORDER — FLUCONAZOLE 100 MG PO TABS
100.0000 mg | ORAL_TABLET | Freq: Every day | ORAL | Status: DC
  Administered 2016-05-11 – 2016-05-12 (×2): 100 mg via ORAL
  Filled 2016-05-11 (×2): qty 1

## 2016-05-11 MED ORDER — INSULIN GLARGINE 100 UNIT/ML ~~LOC~~ SOLN
15.0000 [IU] | Freq: Every day | SUBCUTANEOUS | Status: DC
  Administered 2016-05-11 – 2016-05-13 (×3): 15 [IU] via SUBCUTANEOUS
  Filled 2016-05-11 (×4): qty 0.15

## 2016-05-11 MED ORDER — CHLORHEXIDINE GLUCONATE CLOTH 2 % EX PADS
6.0000 | MEDICATED_PAD | Freq: Every day | CUTANEOUS | Status: DC
  Administered 2016-05-12 – 2016-05-14 (×3): 6 via TOPICAL

## 2016-05-11 MED ORDER — INSULIN ASPART 100 UNIT/ML ~~LOC~~ SOLN
0.0000 [IU] | Freq: Three times a day (TID) | SUBCUTANEOUS | Status: DC
  Administered 2016-05-11: 20 [IU] via SUBCUTANEOUS
  Administered 2016-05-11: 11 [IU] via SUBCUTANEOUS
  Administered 2016-05-12: 3 [IU] via SUBCUTANEOUS
  Administered 2016-05-14: 15 [IU] via SUBCUTANEOUS
  Administered 2016-05-14: 7 [IU] via SUBCUTANEOUS

## 2016-05-11 MED ORDER — SODIUM CHLORIDE 0.9 % IV SOLN
INTRAVENOUS | Status: AC
  Administered 2016-05-11 – 2016-05-12 (×2): via INTRAVENOUS

## 2016-05-11 MED ORDER — TACROLIMUS 0.5 MG PO CAPS
0.5000 mg | ORAL_CAPSULE | Freq: Every day | ORAL | Status: DC
  Administered 2016-05-12 – 2016-05-14 (×3): 0.5 mg via ORAL
  Filled 2016-05-11 (×3): qty 1

## 2016-05-11 MED ORDER — MUPIROCIN 2 % EX OINT
1.0000 "application " | TOPICAL_OINTMENT | Freq: Two times a day (BID) | CUTANEOUS | Status: DC
  Administered 2016-05-11 – 2016-05-14 (×6): 1 via NASAL
  Filled 2016-05-11 (×3): qty 22

## 2016-05-11 NOTE — Progress Notes (Signed)
Pt CBG 436. MD notified. Administered Novolog 20 units. Will continue to monitor.

## 2016-05-11 NOTE — Consult Note (Addendum)
Admit date: 05/08/2016 Referring Physician  Dr. Denton Brick Primary Physician  Dr. Rochel Brome Primary Cardiologist  Dr. Shelva Majestic Reason for Consultation  Atrial fibrillation  HPI: This is a 76yo male with medical history significant for insulin-dependent diabetes mellitus, hypertension, chronic atrial fibrillation, renal transplant in 2005 with chronic kidney disease stage IV, now presenting to the emergency department for evaluation of malaise, nausea, vomiting, and hyperglycemia. Patient describes a progressive decline in his overall health over the past year. He has lost a lot of weight over this interval and has had a dramatic decrease in his insulin requirements. He is followed by endocrinology, but there has been difficulty managing his insulin as there had been no CBGs performed at home. He now has home health RN who checks his blood sugar and is found to be markedly elevated the past couple days. Over that same interval, the patient is had a loss of appetite with nausea and nonbloody nonbilious vomiting. He's also had loose stools, but denies any fever, chills, cough, dyspnea, chest pain, or palpitations. Home health nurse found a random glucose to be 566 today and administered 15 units of NovoLog. CBG remained greater than 500 and the patient was directed to the emergency department for further evaluation of this. Of note, patient has been taken off of Coumadin last week due to poor follow-up for INR checks and recurrent need for vitamin K.  Upon arrival to the ED, patient is found to be mildly hypertensive, but with vitals otherwise stable. EKG features in atrial fibrillation with anterior Q waves. Chemistry panels notable for a potassium of 2.7, bicarbonate of 20, BUN 47, creatinine 1.92, and glucose 551. Patient had been given 1 L of normal saline en route to the hospital and was given another liter of normal saline in the emergency department. He was treated with 40 mEq of oral potassium  and 30 mEq of IV potassium.  He was found to have a UTI and was started on Ceftriaxone.  He was noted on the telemetry today to have a 6 beat run of wide complex tachycardia and Cardiology is now asked to consult. 2D echo 10/2015 showed normal LVF with EF 65-70% with Grade 2 DD, mild LAE, moderate RAE and moderate pulmonary HTN with PASP 69mmHg. He denies any chest pain or pressure.  No SOB, DOE, PND, orthopnea or LE edema.      PMH:   Past Medical History:  Diagnosis Date  . Acute pancreatitis 02/12/2016  . Chronic diastolic CHF (congestive heart failure) ([place])   . CKD (chronic kidney disease) stage 4, GFR 15-29 ml/min (HCC)    kidney transplant  with recent HD now off  . Diabetes ([place])   . Diabetic neuropathy ([place])   . Gout   . Hypertension   . Hypothyroidism   . 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 Permanent atrial fibrillation ([place]) 02/12/2016  . PVD (peripheral vascular disease) ([place]) 11/05/2015   Successful atherectomy and drug coated balloon angioplasty of a left popliteal stenosis and angioplasty of a left anterior tibial artery that was occluded.  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 Ulcer of other part of foot 12/15/2013     PSH:   Past Surgical History:  Procedure Laterality Date  . AMPUTATION Left 11/06/2015   Procedure: AMPUTATION LEFT GREAT TOE;  Surgeon: Elam Dutch, MD;  Location: [facility];  Service: Vascular;  Laterality: Left;  . ANKLE FRACTURE  SURGERY    . AV FISTULA PLACEMENT Left 11/19/2015   Procedure: LEFT ARM ARTERIOVENOUS (AV) FISTULA CREATION;  Surgeon: Waynetta Sandy, MD;  Location: [facility];  Service: Vascular;  Laterality: Left;  . CATARACT EXTRACTION    . INSERTION OF DIALYSIS CATHETER Right 11/19/2015   Procedure: INSERTION OF DIALYSIS CATHETER RIGHT INTERNAL JUGULAR;  Surgeon: Waynetta Sandy, MD;  Location: [facility];  Service: Vascular;  Laterality: Right;  . IR GENERIC HISTORICAL   11/14/2015   IR FLUORO GUIDE CV LINE RIGHT 11/14/2015 Markus Daft, MD [facility]  . IR GENERIC HISTORICAL  11/14/2015   IR US GUIDE VASC ACCESS RIGHT 11/14/2015 Markus Daft, MD [facility]  . KIDNEY TRANSPLANT  2012  . MELANOMA EXCISION    . NEPHRECTOMY TRANSPLANTED ORGAN    . PERIPHERAL VASCULAR CATHETERIZATION N/A 11/05/2015   Procedure: Abdominal Aortogram w/Lower Extremity;  Surgeon: Serafina Mitchell, MD;  Location: [facility];  Service: Cardiovascular;  Laterality: N/A;  . PERIPHERAL VASCULAR CATHETERIZATION Left 11/05/2015   Procedure: Peripheral Vascular Atherectomy;  Surgeon: Serafina Mitchell, MD;  Location: [facility];  Service: Cardiovascular;  Laterality: Left;  popiteal  . PERIPHERAL VASCULAR CATHETERIZATION Left 11/05/2015   Procedure: Peripheral Vascular Balloon Angioplasty;  Surgeon: Serafina Mitchell, MD;  Location: [facility];  Service: Cardiovascular;  Laterality: Left;  anterior tib    Allergies:  Tape Prior to Admit Meds:   Prescriptions Prior to Admission  Medication Sig Dispense Refill Last Dose  . acetaminophen (TYLENOL) 500 MG tablet Take 500 mg by mouth every 6 (six) hours as needed for mild pain or moderate pain.   Past Week at Unknown time  . allopurinol (ZYLOPRIM) 300 MG tablet Take 150 mg by mouth in the morning 15 tablet 5 05/08/2016 at Unknown time  . aspirin 81 MG chewable tablet Chew 81 mg by mouth at bedtime.   05/07/2016 at Unknown time  . feeding supplement (BOOST / RESOURCE BREEZE) LIQD Take 1 Container by mouth 3 (three) times daily with meals.  0 05/08/2016 at Unknown time  . fenofibrate micronized (LOFIBRA) 134 MG capsule Take 134 mg by mouth daily before breakfast.   05/08/2016 at Unknown time  . insulin aspart (NOVOLOG) 100 UNIT/ML injection Inject 0-15 Units into the skin daily after breakfast. Per sliding scale: 151-200=4 201-250=6 251-300=8 301-350=10 351-400=12 Over 400=15   05/08/2016 at Unknown time  . insulin glargine (LANTUS) 100 UNIT/ML  injection Inject 0.05 mLs (5 Units total) into the skin at bedtime. (Patient taking differently: Inject 14 Units into the skin at bedtime. ) 10 mL 11 05/07/2016 at Unknown time  . levothyroxine (SYNTHROID, LEVOTHROID) 125 MCG tablet Take 125 mcg by mouth every morning.  0 05/08/2016 at Unknown time  . metoprolol succinate (TOPROL-XL) 50 MG 24 hr tablet Take 50 mg by mouth daily.    05/08/2016 at 0930  . multivitamin (RENA-VIT) TABS tablet Take 1 tablet by mouth at bedtime.  0 05/08/2016 at Unknown time  . pantoprazole (PROTONIX) 40 MG tablet Take 1 tablet (40 mg total) by mouth daily. (Patient taking differently: Take 40 mg by mouth 2 (two) times daily before a meal. )   05/08/2016 at Unknown time  . predniSONE (DELTASONE) 5 MG tablet Take 5 mg by mouth daily.  0 05/08/2016 at Unknown time  . rosuvastatin (CRESTOR) 5 MG tablet Take 5 mg by mouth every evening.   05/07/2016 at Unknown time  . SURE COMFORT PEN NEEDLES 32G X  4 MM MISC USE AS DIRECTED 5 TIMES DAILY SUBQUE. 200 each 1 05/08/2016 at Unknown time  . tacrolimus (PROGRAF) 0.5 MG capsule Take 0.5 mg by mouth 2 (two) times daily.   05/08/2016 at am  . tamsulosin (FLOMAX) 0.4 MG CAPS capsule Take 1 capsule (0.4 mg total) by mouth daily after supper. 30 capsule  05/07/2016 at Unknown time  . vitamin B-12 (CYANOCOBALAMIN) 100 MCG tablet Take 100 mcg by mouth daily.   05/08/2016 at Unknown time  . polyethylene glycol (MIRALAX / GLYCOLAX) packet Take 17 g by mouth daily. 14 each 0 Unknown  . terbinafine (LAMISIL AT) 1 % cream Apply 1 application topically 2 (two) times daily. Bilateral feet 30 g 0 Unknown  . [DISCONTINUED] levothyroxine (SYNTHROID, LEVOTHROID) 137 MCG tablet TAKE ONE TABLET BY MOUTH DAILY BEFORE breakfast  0    Fam HX:    Family History  Problem Relation Age of Onset  . Bradycardia Mother   . Stroke Father    Social HX:    Social History   Social History  . Marital status: Widowed    Spouse name: N/A  . Number of children: N/A  . Years of  education: N/A   Occupational History  . Not on file.   Social History Main Topics  . Smoking status: Never Smoker  . Smokeless tobacco: Never Used  . Alcohol use No  . Drug use: No  . Sexual activity: Not on file   Other Topics Concern  . Not on file   Social History Narrative  . No narrative on file     ROS:  All 11 ROS were addressed and are negative except what is stated in the HPI  Physical Exam: Blood pressure 127/64, pulse 87, temperature 97.7 F (36.5 C), temperature source Oral, resp. rate 16, height 5\' 9"  (1.753 m), weight 161 lb 13.1 oz (73.4 kg), SpO2 100 %.    General: Well developed, well nourished, in no acute distress Head: Eyes PERRLA, No xanthomas.   Normal cephalic and atramatic  Lungs:   Clear bilaterally to auscultation and percussion. Heart:   Irregularly irregularS1 S2 Pulses are 2+ & equal.            No carotid bruit. No JVD.  No abdominal bruits. No femoral bruits. Abdomen: Bowel sounds are positive, abdomen soft and non-tender without masses  Msk:  Back normal, normal gait. Normal strength and tone for age. Extremities:   No clubbing, cyanosis or edema.  DP +1 Neuro: Alert and oriented X 3. Psych:  Good affect, responds appropriately    Labs:   Lab Results  Component Value Date   WBC 5.3 05/11/2016   HGB 11.5 (L) 05/11/2016   HCT 35.1 (L) 05/11/2016   MCV 91.2 05/11/2016   PLT 139 (L) 05/11/2016     Recent Labs Lab 05/11/16 0527  NA 143  K 3.9  CL 116*  CO2 21*  BUN 39*  CREATININE 1.56*  CALCIUM 8.5*  PROT 5.4*  BILITOT 0.9  ALKPHOS 38  ALT 10*  AST 18  GLUCOSE 368*   No results found for: PTT Lab Results  Component Value Date   INR 1.77 05/08/2016   INR 2.01 03/20/2016   INR 1.85 03/18/2016   Lab Results  Component Value Date   TROPONINI <0.03 05/08/2016     Lab Results  Component Value Date   CHOL 101 04/27/2016   CHOL 74 02/13/2016   CHOL 233 (H) 06/06/2015   Lab Results  Component Value Date   HDL  28.30 (L) 04/27/2016   HDL 13 (L) 02/13/2016   HDL 29.20 (L) 06/06/2015   Lab Results  Component Value Date   LDLCALC 44 04/27/2016   LDLCALC 39 02/13/2016   LDLCALC 88 09/27/2013   Lab Results  Component Value Date   TRIG 148.0 04/27/2016   TRIG 109 02/13/2016   TRIG 397.0 (H) 06/06/2015   Lab Results  Component Value Date   CHOLHDL 4 04/27/2016   CHOLHDL 5.7 02/13/2016   CHOLHDL 8 06/06/2015   Lab Results  Component Value Date   LDLDIRECT 119.0 06/06/2015   LDLDIRECT 133.0 01/24/2015   LDLDIRECT 96.0 10/17/2014      Radiology:  No results found.  EKG:  Atrial fibrillation with anterior infarct and occasional PVCs  ASSESSMENT/PLAN:   1.  Chronic atrial fibrillation rate controlled.  His CHADS2VASC score is 2 and had been on warfarin but was taken off due to concern for noncompliance with followup and need for recurrent Vit K due to supratherapeutic levels.  He is not a candidate for DOAC due to CKD stage 4 (creatinine is 1.38 today but has been as high as 2.64 recently) and renal tx.  Continue BB for rate control.   2.  Wide complex tachycardia 6 beats that could be Ashman's phenomenon but it is fairly regular so could be VT.  Last echo showed normal LVF.  Will repeat echo to make sure LVF remains intact.  He has not had any ischemic workup in the past and Chest CT without contrast 02/2016 showed coronary artery calcifications so this could be due to underlying CAD.  He has multiple CRFs including IDDM, HTN, PVD.  He is extremely debilitated at this time and cannot ambulate at home.  He his in a wheelchair at home and lives with his daughter.  I will talk with his daughter to see how aggressive she wants to be with workup of CAD.  3.  UTI on antibx per TRH  4.  IDDM - per TRH.  BS still fairly high  5.  CKD stage IV post renal transplant   Fransico Him, MD  05/11/2016  2:27 PM

## 2016-05-11 NOTE — Progress Notes (Addendum)
Inpatient Diabetes Program Recommendations  AACE/ADA: New Consensus Statement on Inpatient Glycemic Control (2015)  Target Ranges:  Prepandial:   less than 140 mg/dL      Peak postprandial:   less than 180 mg/dL (1-2 hours)      Critically ill patients:  140 - 180 mg/dL   Lab Results  Component Value Date   GLUCAP 338 (H) 05/11/2016   HGBA1C 6.7 (H) 02/12/2016   Review of Glycemic Control  Diabetes history: DM 2 (Sees Dr Dwyane Dee, Endocrinology) Outpatient Diabetes medications: Lantus 14 units, Novolog Correction and Novolog 6 units Meal coverage Current orders for Inpatient glycemic control: Lantus 10 units, Novolog resistant + HS scale  Inpatient Diabetes Program Recommendations:   Per Dr. Ronnie Derby note 04/29/16 he told patient to increase Lantus to 14 units and take Novolog 6 units with each meal in addition to correction scale. Patient with poor PO intake.  Agree with increase in Novolog Correction scale Resistant. Please also consider increasing Lantus to home dose Lantus 14 units.  Thanks,  Tama Headings RN, MSN, [facility] Inpatient Diabetes Coordinator Team Pager [phone] (8a-5p)

## 2016-05-11 NOTE — Clinical Social Work Note (Addendum)
MSW contacted patient's dtr, Mark [name] in regards to post-acute placement for SNF. Patient's dtr reported that patient has been recently discharged from [facility] and has used Medicare days for STR. MSW confirmed Medicare days with facility.   Patient's dtr is agreeable to patient returning home with her w/ [facility] car. Dtr also aware of possible discharge for tomorrow, 2/6.   MD and RNCM notified.   Medical Social Worker will sign off for now as social work intervention is no longer needed. Please consult [place] again if new need arises.  Glendon Axe, MSW [phone] 05/11/2016 2:12 PM

## 2016-05-11 NOTE — Progress Notes (Signed)
Physical Therapy Treatment Patient Details Name: KHYRI BARCO MRN: [id number] DOB: 1940-07-29 Today's Date: 05/11/2016    History of Present Illness 76 yo male admitted with hypokalemia, hyperglycemia. Hx of L necrotic foot wound, gout, A fib, kidney transplant, L toe amputation, DM, HTN, CHF, CKD, neuropathy, MRSA    PT Comments    Pt making limited progress, he is rather unmotivated although does work with PT (minimal pt effort) today with strong encouragement; recommend SNF, will continue to follow in acute setting  Follow Up Recommendations  SNF     Equipment Recommendations  None recommended by PT    Recommendations for Other Services       Precautions / Restrictions Precautions Precautions: Fall Required Braces or Orthoses: Other Brace/Splint Other Brace/Splint: L DARCO per chart and pt--not in room, pt reports it is 'at home" Restrictions Weight Bearing Restrictions: No    Mobility  Bed Mobility Overal bed mobility: Needs Assistance Bed Mobility: Supine to Sit;Sit to Supine     Supine to sit: +2 for physical assistance;+2 for safety/equipment;Max assist Sit to supine: Total assist;+2 for physical assistance;+2 for safety/equipment   General bed mobility comments: Assist for trunk and bil LEs. Utilized bedpad for scooting, positioning. Increased time and encouragement required.   Transfers                 General transfer comment: NT-pt unwilling and DARCO shoe not in room (encouraged/offerred scooting to chair, pt repeatedly refused)  Ambulation/Gait                 Stairs            Wheelchair Mobility    Modified Rankin (Stroke Patients Only)       Balance     Sitting balance-Leahy Scale: Poor Sitting balance - Comments: sat EOB for 35minutes with strong encouragement and distraction by PT; attempted to work on core strength and improved posture however pt generally uncooperative Postural control: Left lateral  lean;Posterior lean                          Cognition Arousal/Alertness: Awake/alert Behavior During Therapy: WFL for tasks assessed/performed Overall Cognitive Status: Within Functional Limits for tasks assessed                 General Comments: pt is generally unmotivated adn verbal regarding such; he requires maximum, firm encouragement to perform any physical exertion including reaching for his own cup  of water  after stating he was thirsty    Exercises      General Comments        Pertinent Vitals/Pain Pain Assessment: Faces Faces Pain Scale: Hurts even more Pain Location: LEs with activity Pain Descriptors / Indicators: Grimacing;Guarding;Sore;Tender Pain Intervention(s): Limited activity within patient's tolerance;Monitored during session;Repositioned    Home Living                      Prior Function            PT Goals (current goals can now be found in the care plan section) Acute Rehab PT Goals Patient Stated Goal: none stated PT Goal Formulation: With patient Time For Goal Achievement: 05/23/16 Potential to Achieve Goals: Fair Progress towards PT goals: Not progressing toward goals - comment (slow progress, decr participation)    Frequency    Min 3X/week      PT Plan Current plan remains appropriate    Co-evaluation  End of Session   Activity Tolerance: Other (comment) (self limiting d/t lack of motivation) Patient left: in bed;with call bell/phone within reach;with bed alarm set     Time: 1527-1550 PT Time Calculation (min) (ACUTE ONLY): 23 min  Charges:  $Therapeutic Activity: 23-37 mins                    G Codes:      Minette Manders 05-30-2016, 4:22 PM

## 2016-05-11 NOTE — Progress Notes (Signed)
[facility]  PROGRESS NOTE  Mark [name] [id number] DOB: [birth year] DOA: 05/08/2016 PCP: Rochel Brome, MD   Brief HPI:    76 y.o. male with medical history significant for insulin-dependent diabetes mellitus, hypertension, chronic atrial fibrillation, renal transplant in 2005 with chronic kidney disease stage IV, now presenting to the emergency department for evaluation of malaise, nausea, vomiting, and hyperglycemia. Patient describes a progressive decline in his overall health over the past year  Upon arrival to the ED, patient is found to be mildly hypertensive, but with vitals otherwise stable. EKG features in atrial fibrillation with anterior Q waves. Chemistry panels notable for a potassium of 2.7, bicarbonate of 20, BUN 47, creatinine 1.92, and glucose 551  Subjective   Pt answers sometimes in-appropriate. Say I should not examine him because of the light in his chest. She has a friend at bedside and sees intermittently patient sees things that did not make sense. Otherwise has no complaints   Assessment/Plan:   UTI- UA at the time of admission was abnormal,l urine culture obtained. ?symptomatic UTI. As pt presented With mildly nausea vomiting hypokalemia hyperglycemia   - Urine cultures growing more than 100,000 colonies of yeast.   - Patient has no indwelling Foley catheter,    - On rounds with inpatient pharmacy discussed options for anti-fungal agents, considering fluconazole will interact with the patient's tacrolimus .- Recommendations by pharmacy to monitor tacrolimus levels - Discontinue ceftriaxone - Chest x-ray showed a small infiltrate in the left lower lobe. patient appears to be asymptomatic from this, doubt pneumonia no white count and no fevers. - PT evaluation recommended SNIF put patient's does not qualify for a insurance coverage for rehabilitation  6 beats run of V. Tach- asymptomatic - EKG- QTC 377,  - Troponin 1 - Appreciate cardiology evaluation,  follow-up echo  Hyperglycemia, insulin-dependent diabetes mellitus-blood glucose  Is elevated,   - Increase Lantus to 15 units daily -  change sliding scale to resistant  - Appreciate DM coordinator recommendations   Hypokalemia- repleted, potassium is 3.9 today.  C K D stage IV, status post renal transplant- serum creatinine was 1.92 on admission, stable at 1.5  - he has been undergoing hemodialysis twice weekly until recently taken off.  - Continue Prograf and low-dose prednisone.  Chronic atrial fibrillation- heart rate is controlled, CHA2DS2VASc score is 2. He was previously anticogulated with warfarin, before he was taken off due to safety concern given for follow-up and recurrent need for vitamin K. Continue metoprolol and aspirin  Normocytic anemia- hemoglobin is 11.9 on admission. Continue B12 supplementation. Likely related to chronic renal disease.   DVT prophylaxis: Heparin  Code Status:  full code   Family Communication: No family present at bedside, pt friend at bedside.  Disposition Plan: PT recommending SNF, but pt would be discharge home with [facility], when medically ready.  Consultants:  Cardiology- Fransico Him, MD.  Procedures:  None   . sodium chloride 75 mL/hr at 05/11/16 1325  Antibiotics:   Anti-infectives    Start     Dose/Rate Route Frequency Ordered Stop   05/11/16 1515  fluconazole (DIFLUCAN) tablet 100 mg     100 mg Oral Daily 05/11/16 1504     05/09/16 1000  cefTRIAXone (ROCEPHIN) 1 g in dextrose 5 % 50 mL IVPB  Status:  Discontinued     1 g 100 mL/hr over 30 Minutes Intravenous Every 24 hours 05/09/16 0907 05/11/16 1203       Objective   Vitals:   05/10/16  1454 05/10/16 2036 05/11/16 0414 05/11/16 0629  BP: (!) 151/85 123/70 127/64   Pulse: 76 76 87   Resp: 14 16    Temp: 97.8 F (36.6 C) 97.9 F (36.6 C) 97.7 F (36.5 C)   TempSrc: Oral Oral Oral   SpO2: 97% 99% 100%   Weight:   73.6 kg (162 lb 4.1 oz) 73.4 kg (161 lb 13.1 oz)   Height:        Intake/Output Summary (Last 24 hours) at 05/11/16 1949 Last data filed at 05/11/16 1730  Gross per 24 hour  Intake             1494 ml  Output             1200 ml  Net              294 ml   Filed Weights   05/09/16 0515 05/11/16 0414 05/11/16 0629  Weight: 76.4 kg (168 lb 6.9 oz) 73.6 kg (162 lb 4.1 oz) 73.4 kg (161 lb 13.1 oz)     Physical Examination:  General exam: Appears calm and comfortable. Respiratory system: Clear to auscultation. Respiratory effort normal. Cardiovascular system:  RRR. No  murmurs, rubs, gallops. No pedal edema. GI system: Abdomen is nondistended, soft and nontender. No organomegaly.  Central nervous system. No focal neurological deficits. 5 x 5 power in all extremities. Skin:  Dressings on lower extremities, pt insisted I should not remove dressings.  Psychiatry: Alert, oriented x 3.Judgement and insight appear normal. Affect normal.  Data Reviewed: I have personally reviewed following labs and imaging studies  CBG:  Recent Labs Lab 05/10/16 1633 05/10/16 2036 05/11/16 0828 05/11/16 1236 05/11/16 1729  GLUCAP 228* 265* 338* 436* 277*    CBC:  Recent Labs Lab 05/08/16 1700 05/09/16 0226 05/10/16 0513 05/11/16 0527  WBC 5.0 4.5 5.7 5.3  NEUTROABS 3.5 2.8  --   --   HGB 11.9* 10.9* 11.9* 11.5*  HCT 35.6* 31.5* 34.4* 35.1*  MCV 91.5 91.0 90.5 91.2  PLT 153 146* 134* 139*    Basic Metabolic Panel:  Recent Labs Lab 05/08/16 2145 05/09/16 0226 05/09/16 0850 05/10/16 0513 05/11/16 0527  NA 141 142 144 143 143  K 3.2* 3.1* 3.3* 3.9 3.9  CL 114* 118* 118* 117* 116*  CO2 20* 18* 20* 20* 21*  GLUCOSE 459* 320* 125* 360* 368*  BUN 45* 39* 37* 35* 39*  CREATININE 1.81* 1.45* 1.38* 1.39* 1.56*  CALCIUM 8.4* 8.4* 8.6* 8.4* 8.5*  MG  --  2.0  --   --   --     Recent Results (from the past 240 hour(s))  Culture, blood (routine x 2)     Status: None (Preliminary result)   Collection Time: 05/08/16  9:45 PM  Result  Value Ref Range Status   Specimen Description BLOOD RIGHT HAND  Final   Special Requests BOTTLES DRAWN AEROBIC ONLY 5CC  Final   Culture   Final    NO GROWTH 2 DAYS Performed at [facility], [address], [place], [place] [address]    Report Status PENDING  Incomplete  Culture, blood (routine x 2)     Status: None (Preliminary result)   Collection Time: 05/08/16  9:45 PM  Result Value Ref Range Status   Specimen Description BLOOD RIGHT HAND  Final   Special Requests IN PEDIATRIC BOTTLE 2CC  Final   Culture   Final    NO GROWTH 2 DAYS Performed at [facility]  [facility], [place] [address], [place], [place] [address]    Report Status PENDING  Incomplete  MRSA PCR Screening     Status: Abnormal   Collection Time: 05/08/16 11:56 PM  Result Value Ref Range Status   MRSA by PCR POSITIVE (A) NEGATIVE Final    Comment:        The GeneXpert MRSA Assay (FDA approved for NASAL specimens only), is one component of a comprehensive MRSA colonization surveillance program. It is not intended to diagnose MRSA infection nor to guide or monitor treatment for MRSA infections. RESULT CALLED TO, READ BACK BY AND VERIFIED WITH: OBRYANT,M RN 2.3.18 @0210  ZANDO,C   Culture, Urine     Status: Abnormal   Collection Time: 05/09/16  2:49 PM  Result Value Ref Range Status   Specimen Description URINE, RANDOM  Final   Special Requests NONE  Final   Culture >=100,000 COLONIES/mL YEAST (A)  Final   Report Status 05/10/2016 FINAL  Final     Liver Function Tests:  Recent Labs Lab 05/08/16 1700 05/11/16 0527  AST 26 18  ALT 11* 10*  ALKPHOS 39 38  BILITOT 1.2 0.9  PROT 5.8* 5.4*  ALBUMIN 2.5* 2.5*   Cardiac Enzymes:  Recent Labs Lab 05/08/16 2145 05/11/16 1611  TROPONINI <0.03 <0.03   Studies: No results found.  Scheduled Meds: . allopurinol  150 mg Oral Daily  . aspirin  81 mg Oral QHS  . [START ON 05/12/2016] Chlorhexidine Gluconate Cloth  6 each Topical Q0600  . feeding  supplement  1 Container Oral TID WC  . fenofibrate  160 mg Oral Daily  . fluconazole  100 mg Oral Daily  . heparin  5,000 Units Subcutaneous Q8H  . insulin aspart  0-20 Units Subcutaneous TID WC  . insulin aspart  0-5 Units Subcutaneous QHS  . insulin glargine  15 Units Subcutaneous QHS  . levothyroxine  125 mcg Oral QAC breakfast  . metoprolol succinate  50 mg Oral Daily  . multivitamin  1 tablet Oral QHS  . mupirocin ointment  1 application Nasal BID  . pantoprazole  40 mg Oral BID  . predniSONE  5 mg Oral Daily  . rosuvastatin  5 mg Oral QPM  . sodium chloride flush  3 mL Intravenous Q12H  . [START ON 05/12/2016] tacrolimus  0.5 mg Oral Daily  . tamsulosin  0.4 mg Oral QPC supper  . vitamin B-12  100 mcg Oral Daily    Time spent: 25 [place] [phone]   [facility] Pager [phone]. If 7PM-7AM, please contact night-coverage at [email or web address], Office  [phone]  password [email or web address] 05/11/2016, 7:49 PM  LOS: 2 days

## 2016-05-11 NOTE — Progress Notes (Signed)
Pt had 6 beats of Vtach at 8:39. MD notified. Will continue to monitor.

## 2016-05-12 ENCOUNTER — Inpatient Hospital Stay (HOSPITAL_COMMUNITY): Payer: Medicare Other

## 2016-05-12 DIAGNOSIS — I4891 Unspecified atrial fibrillation: Secondary | ICD-10-CM

## 2016-05-12 DIAGNOSIS — E876 Hypokalemia: Secondary | ICD-10-CM

## 2016-05-12 DIAGNOSIS — M86672 Other chronic osteomyelitis, left ankle and foot: Secondary | ICD-10-CM

## 2016-05-12 LAB — BASIC METABOLIC PANEL
Anion gap: 6 (ref 5–15)
BUN: 38 mg/dL — AB (ref 6–20)
CHLORIDE: 116 mmol/L — AB (ref 101–111)
CO2: 19 mmol/L — AB (ref 22–32)
Calcium: 8 mg/dL — ABNORMAL LOW (ref 8.9–10.3)
Creatinine, Ser: 1.2 mg/dL (ref 0.61–1.24)
GFR calc non Af Amer: 57 mL/min — ABNORMAL LOW (ref >60)
GLUCOSE: 97 mg/dL (ref 65–99)
Potassium: 3.3 mmol/L — ABNORMAL LOW (ref 3.5–5.1)
Sodium: 141 mmol/L (ref 135–145)

## 2016-05-12 LAB — ECHOCARDIOGRAM COMPLETE
Height: 69 in
WEIGHTICAEL: 2811.31 [oz_av]

## 2016-05-12 LAB — GLUCOSE, CAPILLARY
GLUCOSE-CAPILLARY: 115 mg/dL — AB (ref 65–99)
GLUCOSE-CAPILLARY: 184 mg/dL — AB (ref 65–99)
Glucose-Capillary: 82 mg/dL (ref 65–99)
Glucose-Capillary: 265 mg/dL — ABNORMAL HIGH (ref 65–99)

## 2016-05-12 LAB — MAGNESIUM: MAGNESIUM: 1.4 mg/dL — AB (ref 1.7–2.4)

## 2016-05-12 MED ORDER — POTASSIUM CHLORIDE CRYS ER 20 MEQ PO TBCR
40.0000 meq | EXTENDED_RELEASE_TABLET | Freq: Once | ORAL | Status: AC
  Administered 2016-05-12: 40 meq via ORAL
  Filled 2016-05-12: qty 2

## 2016-05-12 NOTE — Progress Notes (Signed)
Pt confused this am and refused labs. Mark [name] he would only let them draw if they had a butterfly needle. Lab tech explained to pt that butterfly needles are not used for lab draws in this hospital system. Pt refused stating "I will let you do it when you have a butterfly needle." RN asked lab tech to reschedule labs for 8 am.

## 2016-05-12 NOTE — Progress Notes (Signed)
Progress Note  Patient Name: Mark [name] Date of Encounter: 05/12/2016  Primary Cardiologist: Dr. Claiborne Billings  Subjective   Denies any chest discomfort or palpitations. Says he is concerned about having to undergo more toe amputations.   Inpatient Medications    Scheduled Meds: . allopurinol  150 mg Oral Daily  . aspirin  81 mg Oral QHS  . Chlorhexidine Gluconate Cloth  6 each Topical Q0600  . feeding supplement  1 Container Oral TID WC  . fenofibrate  160 mg Oral Daily  . fluconazole  100 mg Oral Daily  . heparin  5,000 Units Subcutaneous Q8H  . insulin aspart  0-20 Units Subcutaneous TID WC  . insulin aspart  0-5 Units Subcutaneous QHS  . insulin glargine  15 Units Subcutaneous QHS  . levothyroxine  125 mcg Oral QAC breakfast  . metoprolol succinate  50 mg Oral Daily  . multivitamin  1 tablet Oral QHS  . mupirocin ointment  1 application Nasal BID  . pantoprazole  40 mg Oral BID  . predniSONE  5 mg Oral Daily  . rosuvastatin  5 mg Oral QPM  . sodium chloride flush  3 mL Intravenous Q12H  . tacrolimus  0.5 mg Oral Daily  . tamsulosin  0.4 mg Oral QPC supper  . vitamin B-12  100 mcg Oral Daily   Continuous Infusions: . sodium chloride 75 mL/hr at 05/12/16 0339   PRN Meds: acetaminophen, HYDROcodone-acetaminophen, ipratropium-albuterol, ondansetron **OR** ondansetron (ZOFRAN) IV   Vital Signs    Vitals:   05/11/16 0414 05/11/16 0629 05/11/16 2218 05/12/16 0609  BP: 127/64  (!) 113/41 (!) 145/60  Pulse: 87  78 60  Resp:   16 16  Temp: 97.7 F (36.5 C)  97.6 F (36.4 C) 98.1 F (36.7 C)  TempSrc: Oral  Oral Axillary  SpO2: 100%  100% 99%  Weight: 162 lb 4.1 oz (73.6 kg) 161 lb 13.1 oz (73.4 kg)  175 lb 11.3 oz (79.7 kg)  Height:        Intake/Output Summary (Last 24 hours) at 05/12/16 1118 Last data filed at 05/12/16 0624  Gross per 24 hour  Intake           717.75 ml  Output             1050 ml  Net          -332.25 ml   Filed Weights   05/11/16 0414  05/11/16 0629 05/12/16 0609  Weight: 162 lb 4.1 oz (73.6 kg) 161 lb 13.1 oz (73.4 kg) 175 lb 11.3 oz (79.7 kg)    Telemetry    Atrial fibrillation, HR in 70's - 90's. 3 beats NSVT.  - Personally Reviewed  ECG    No new tracings.   Physical Exam   General: Elderly Caucasian male appearing in no acute distress. Head: Normocephalic, atraumatic.  Neck: Supple without bruits, JVD not elevated. Lungs:  Resp regular and unlabored, CTA without wheezing or rales. Heart: RRR, S1, S2, no S3, S4, or murmur; no rub. Abdomen: Soft, non-tender, non-distended with normoactive bowel sounds. No hepatomegaly. No rebound/guarding. No obvious abdominal masses. Extremities: No clubbing, cyanosis, or edema. Distal pedal pulses are 1+ bilaterally. Dressings in place along feet bilaterally.  Neuro: Alert and oriented X 3. Moves all extremities spontaneously. Psych: Normal affect.  Labs    Chemistry Recent Labs Lab 05/08/16 1700  05/10/16 0513 05/11/16 0527 05/12/16 0824  NA 140  < > 143 143 141  K 2.7*  < >  3.9 3.9 3.3*  CL 111  < > 117* 116* 116*  CO2 20*  < > 20* 21* 19*  GLUCOSE 551*  < > 360* 368* 97  BUN 47*  < > 35* 39* 38*  CREATININE 1.92*  < > 1.39* 1.56* 1.20  CALCIUM 8.5*  < > 8.4* 8.5* 8.0*  PROT 5.8*  --   --  5.4*  --   ALBUMIN 2.5*  --   --  2.5*  --   AST 26  --   --  18  --   ALT 11*  --   --  10*  --   ALKPHOS 39  --   --  38  --   BILITOT 1.2  --   --  0.9  --   GFRNONAA 32*  < > 48* 41* 57*  GFRAA 37*  < > 55* 48* >60  ANIONGAP 9  < > 6 6 6   < > = values in this interval not displayed.   Hematology Recent Labs Lab 05/09/16 0226 05/10/16 0513 05/11/16 0527  WBC 4.5 5.7 5.3  RBC 3.46* 3.80* 3.85*  HGB 10.9* 11.9* 11.5*  HCT 31.5* 34.4* 35.1*  MCV 91.0 90.5 91.2  MCH 31.5 31.3 29.9  MCHC 34.6 34.6 32.8  RDW 17.6* 18.1* 18.5*  PLT 146* 134* 139*    Cardiac Enzymes Recent Labs Lab 05/08/16 2145 05/11/16 1611  TROPONINI <0.03 <0.03   No results for  input(s): TROPIPOC in the last 168 hours.   BNPNo results for input(s): BNP, PROBNP in the last 168 hours.   DDimer No results for input(s): DDIMER in the last 168 hours.   Radiology    No results found.  Cardiac Studies   Echocardiogram: 05/12/2016 Study Conclusions  - Left ventricle: The cavity size was normal. There was moderate   concentric hypertrophy. Systolic function was normal. The   estimated ejection fraction was in the range of 60% to 65%. Wall   motion was normal; there were no regional wall motion   abnormalities. The study is not technically sufficient to allow   evaluation of LV diastolic function. - Aortic valve: Valve mobility was restricted. There was mild   regurgitation. - Left atrium: The atrium was moderately dilated. - Right ventricle: The cavity size was moderately dilated. Wall   thickness was normal. - Right atrium: The atrium was severely dilated. - Tricuspid valve: There was moderate regurgitation. - Pulmonary arteries: Systolic pressure was moderately increased.   PA peak pressure: 57 mm Hg (S).  Impressions:  - Compared to the prior study, there has been no significant   interval change.  Patient Profile     76 y.o. male w/ PMH of IDDM, chronic atrial fibrillation (not on anticoagulation), HTN, and Stage 4 CKD (s/p renal transplant in 2005) who presented [facility] ED on 05/08/2016 with general malaise and weakness. Diagnosed with UTI. Cards consulted on 05/11/2016 for episodes of NSVT.    Assessment & Plan    1.  Chronic atrial fibrillation - This patients CHA2DS2-VASc Score and unadjusted Ischemic Stroke Rate (% per year) is equal to 4.8 % stroke rate/year from a score of 4 (HTN, DM, Age (2)). On Warfarin in the past but was taken off this due to noncompliance with followup and need for recurrent Vit K due to supratherapeutic levels.  He is not a candidate for DOAC due to CKD stage 4 (creatinine is 1.38 today but has been as high as 2.64 recently).    -  Continue BB for rate control.   2. Wide complex tachycardia - could be Ashman's phenomenon but it is fairly regular so could be VT.  Last echo showed normal LVF. Had 3 beats of WCT overnight and asymptomatic with this. Mg 2.0 on 2/3. K+ 3.3 this AM (will replace).  - repeat echo this admission shows a preserved EF of 60-65% with no wall motion abnormalities. Does have mild AI and moderate TR.  - has not had any ischemic workup in the past and Chest CT without contrast in 02/2016 showed coronary artery calcifications so this could be due to underlying CAD.  He has multiple CRFs including IDDM, HTN, PVD. He is extremely debilitated at this time and cannot ambulate at home.  He his in a wheelchair at home and lives with his daughter. The patient reports he does not want to undergo anymore surgeries or procedures. With echo showing normal LV function, could consider an outpatient NST to assess for ischemia. With his CKD, would avoid an invasive evaluation.   3.  UTI  - on antibiotic treatment. WBC normalized at 5.3 on 2/5. - per admitting team.   4.  IDDM - per admitting team  5.  CKD stage IV  - s/p renal transplant in 2005. - creatinine 1.92 on admission, improved to 1.20 this AM.   Signed, Erma Heritage , PA-C 11:18 AM 05/12/2016 Pager: [phone]

## 2016-05-12 NOTE — Progress Notes (Addendum)
[facility]  PROGRESS NOTE  Mark [name] TAV:[id number] DOB: 12/19/1940 DOA: 05/08/2016 PCP: Rochel Brome, MD  Brief HPI:    76 y.o. male with medical history significant for insulin-dependent diabetes mellitus, hypertension, chronic atrial fibrillation, renal transplant in 2005 with chronic kidney disease stage IV, now presenting to the emergency department for evaluation of malaise, nausea, vomiting, and hyperglycemia. Patient describes a progressive decline in his overall health over the past year  Upon arrival to the ED, patient is found to be mildly hypertensive, but with vitals otherwise stable. EKG features in atrial fibrillation with anterior Q waves. Chemistry panels notable for a potassium of 2.7, bicarbonate of 20, BUN 47, creatinine 1.92, and glucose 551  Subjective   Patient awake, alert, responding Appropriately to questions today. Wounds on bilateral lower extremities which patient allowed me to examine today.   Assessment/Plan:   UTI- Urine culture obtained- Growing yeast, >100 000 colonies. ?symptomatic UTI. As pt presented With mildly nausea vomiting hypokalemia hyperglycemia. But considering immunocompromised status we'll treat with antifungal.  Has condom Catheter was placed here on admission .   - On rounds with inpatient pharmacy discussed options for anti-fungal agents, considering fluconazole will interact with the patient's tacrolimus, Recommendations by pharmacy we will need to monitor tacrolimus levels - Fluconazole at lower dose ,considering renal function, 100 mg daily for 2 weeks - Discontinue ceftriaxone 2/2 >> 2/5 - Chest x-ray showed a small infiltrate in the left lower lobe. patient appears to be asymptomatic from this, doubt pneumonia no white count and no fevers. - PT evaluation recommended SNF put patient's does not qualify for a insurance coverage for rehabilitation - Follow QTC intermittently while on Prograf and fluconazole  Bilateral Foot  ulcers- chronic with history of peripheral vascular disease requiring amputation.  - Left second toe- with eschar, x-ray 2/6- showing active. osteomyelitis - Right big toe- ulcer on dorsal surface, evaluation by wound care for Ortho eval- called podiatry - called ortho, recommended call vascular surgeon considering patient has had amputations by vascular surgery, and has PVD. - Blood cultures 2 no growth 2/2 >> - ESR, CRP - consulted vascular surgery  6 beat run of V. Tach- asymptomatic. Normal Qtc, Trop X1- neg. - Appreciate cardiology evaluation, Echo- 60-65%, Possibility of Ashmans syndrome, keep K > 4, possibility of ischemic ventricular tachycardia bouts will not pursue stress testing at this time, with renal function, and patient does not want cath.. To follow-up with Dr. Claiborne Billings in office, signed off.  Hyperglycemia, insulin-dependent diabetes mellitus-blood glucose  Is elevated,   - Increase Lantus to 15 units daily -  change sliding scale to resistant  - Appreciate DM coordinator recommendations   Hypokalemia- 3.3 today, - might need daily supplementation to keep K >4  C K D stage IV, status post renal transplant- serum creatinine was 1.92 on admission, stable at 1.5  - he has been undergoing hemodialysis twice weekly until recently taken off.  - Continue Prograf, require monitoring while fluconazole and low-dose prednisone.  Chronic atrial fibrillation- heart rate is controlled, CHA2DS2VASc score is 2. He was previously anticogulated with warfarin, before he was taken off due to safety concern given for follow-up and recurrent need for vitamin K. Continue metoprolol and aspirin  Normocytic anemia- hemoglobin is 11.9 on admission. Continue B12 supplementation. Likely related to chronic renal disease.  DVT prophylaxis: Heparin  Code Status:  full code   Family Communication: No family present at bedside, pt friend at bedside.  Disposition Plan: PT recommending SNF, but  pt would  be discharge home with [facility], when medically ready. Patient's insurance would not cover SNF placement <90 days from last SNF stay. Would re-consult to again if pt requires additional procedures inpt prior to discharge.   Consultants:  Cardiology- Fransico Him, MD.  Procedures:  None   Antibiotics:   Anti-infectives    Start     Dose/Rate Route Frequency Ordered Stop   05/11/16 1515  fluconazole (DIFLUCAN) tablet 100 mg     100 mg Oral Daily 05/11/16 1504     05/09/16 1000  cefTRIAXone (ROCEPHIN) 1 g in dextrose 5 % 50 mL IVPB  Status:  Discontinued     1 g 100 mL/hr over 30 Minutes Intravenous Every 24 hours 05/09/16 0907 05/11/16 1203       Objective   Vitals:   05/11/16 0629 05/11/16 2218 05/12/16 0609 05/12/16 1321  BP:  (!) 113/41 (!) 145/60 (!) 147/77  Pulse:  78 60 77  Resp:  '16 16 17  ' Temp:  97.6 F (36.4 C) 98.1 F (36.7 C) 97.7 F (36.5 C)  TempSrc:  Oral Axillary Oral  SpO2:  100% 99% 100%  Weight: 73.4 kg (161 lb 13.1 oz)  79.7 kg (175 lb 11.3 oz)   Height:        Intake/Output Summary (Last 24 hours) at 05/12/16 1709 Last data filed at 05/12/16 1322  Gross per 24 hour  Intake              540 ml  Output             1550 ml  Net            -1010 ml   Filed Weights   05/11/16 0414 05/11/16 0629 05/12/16 0609  Weight: 73.6 kg (162 lb 4.1 oz) 73.4 kg (161 lb 13.1 oz) 79.7 kg (175 lb 11.3 oz)     Physical Examination:  General exam: Appears calm and comfortable. Respiratory system: Clear to auscultation. Respiratory effort normal. Cardiovascular system:  RRR. No  murmurs, rubs, gallops. No pedal edema. GI system: Abdomen is nondistended, soft and nontender. No organomegaly.  Central nervous system. No focal neurological deficits. 5 x 5 power in all extremities. Skin:  Dressings on lower extremities, Right big toe with's mildly draining ulcer on dorsal aspect of great toe- ~2 X2cm, minimal surrounding redness.  Left second toe with an ulcer with eschar  on the distal toe. Psychiatry: Alert, oriented x 3.Judgement and insight appear normal. Affect normal.  Data Reviewed: I have personally reviewed following labs and imaging studies  CBG:  Recent Labs Lab 05/11/16 1729 05/11/16 2157 05/12/16 0736 05/12/16 1212 05/12/16 1646  GLUCAP 277* 220* 82 115* 184*    CBC:  Recent Labs Lab 05/08/16 1700 05/09/16 0226 05/10/16 0513 05/11/16 0527  WBC 5.0 4.5 5.7 5.3  NEUTROABS 3.5 2.8  --   --   HGB 11.9* 10.9* 11.9* 11.5*  HCT 35.6* 31.5* 34.4* 35.1*  MCV 91.5 91.0 90.5 91.2  PLT 153 146* 134* 139*    Basic Metabolic Panel:  Recent Labs Lab 05/09/16 0226 05/09/16 0850 05/10/16 0513 05/11/16 0527 05/12/16 0824  NA 142 144 143 143 141  K 3.1* 3.3* 3.9 3.9 3.3*  CL 118* 118* 117* 116* 116*  CO2 18* 20* 20* 21* 19*  GLUCOSE 320* 125* 360* 368* 97  BUN 39* 37* 35* 39* 38*  CREATININE 1.45* 1.38* 1.39* 1.56* 1.20  CALCIUM 8.4* 8.6* 8.4* 8.5* 8.0*  MG 2.0  --   --   --   --  Recent Results (from the past 240 hour(s))  Culture, blood (routine x 2)     Status: None (Preliminary result)   Collection Time: 05/08/16  9:45 PM  Result Value Ref Range Status   Specimen Description BLOOD RIGHT HAND  Final   Special Requests BOTTLES DRAWN AEROBIC ONLY 5CC  Final   Culture   Final    NO GROWTH 3 DAYS Performed at [place] [facility], [address], [place], [place] [address]    Report Status PENDING  Incomplete  Culture, blood (routine x 2)     Status: None (Preliminary result)   Collection Time: 05/08/16  9:45 PM  Result Value Ref Range Status   Specimen Description BLOOD RIGHT HAND  Final   Special Requests IN PEDIATRIC BOTTLE 2CC  Final   Culture   Final    NO GROWTH 3 DAYS Performed at [facility], [place] [address], [place], [place] [address]    Report Status PENDING  Incomplete  MRSA PCR Screening     Status: Abnormal   Collection Time: 05/08/16 11:56 PM  Result Value Ref Range Status   MRSA by PCR POSITIVE  (A) NEGATIVE Final    Comment:        The GeneXpert MRSA Assay (FDA approved for NASAL specimens only), is one component of a comprehensive MRSA colonization surveillance program. It is not intended to diagnose MRSA infection nor to guide or monitor treatment for MRSA infections. RESULT CALLED TO, READ BACK BY AND VERIFIED WITH: OBRYANT,M RN 2.3.18 '@0210'  ZANDO,C   Culture, Urine     Status: Abnormal   Collection Time: 05/09/16  2:49 PM  Result Value Ref Range Status   Specimen Description URINE, RANDOM  Final   Special Requests NONE  Final   Culture >=100,000 COLONIES/mL YEAST (A)  Final   Report Status 05/10/2016 FINAL  Final     Liver Function Tests:  Recent Labs Lab 05/08/16 1700 05/11/16 0527  AST 26 18  ALT 11* 10*  ALKPHOS 39 38  BILITOT 1.2 0.9  PROT 5.8* 5.4*  ALBUMIN 2.5* 2.5*   Cardiac Enzymes:  Recent Labs Lab 05/08/16 2145 05/11/16 1611  TROPONINI <0.03 <0.03   Studies: Dg Toe 2nd Left  Result Date: 05/12/2016 CLINICAL DATA:  Wound on the tip of the left second toe, diabetes EXAM: LEFT SECOND TOE COMPARISON:  Left foot films of 03/17/2016 FINDINGS: There has been erosion of the majority of the distal phalanx of the left second toe consistent with active osteomyelitis. The bones are diffusely osteopenic. No other abnormality is seen. Arterial calcifications are present diffusely. IMPRESSION: Active osteomyelitis involves the distal phalanx of the left second toe. Electronically Signed   By: Ivar Drape M.D.   On: 05/12/2016 15:01   Dg Toe Great Right  Result Date: 05/12/2016 CLINICAL DATA:  Wound on the plantar aspect of the right great toe and second toe, the patient is diabetic EXAM: RIGHT GREAT TOE COMPARISON:  Right foot films of 03/17/2016 FINDINGS: The bones of the right foot are diffusely osteopenic. There is degenerative change of the right first MTP joint B degenerative change in the DIP joints as well. However, no erosion or focal demineralization  is seen to indicate osteomyelitis. Arterial calcifications are present diffusely. IMPRESSION: 1. No definite radiographic evidence of osteomyelitis is seen. 2. Degenerative change diffusely.  Osteopenia. Electronically Signed   By: Ivar Drape M.D.   On: 05/12/2016 15:00    Scheduled Meds: . allopurinol  150 mg  Oral Daily  . aspirin  81 mg Oral QHS  . Chlorhexidine Gluconate Cloth  6 each Topical Q0600  . feeding supplement  1 Container Oral TID WC  . fenofibrate  160 mg Oral Daily  . fluconazole  100 mg Oral Daily  . heparin  5,000 Units Subcutaneous Q8H  . insulin aspart  0-20 Units Subcutaneous TID WC  . insulin aspart  0-5 Units Subcutaneous QHS  . insulin glargine  15 Units Subcutaneous QHS  . levothyroxine  125 mcg Oral QAC breakfast  . metoprolol succinate  50 mg Oral Daily  . multivitamin  1 tablet Oral QHS  . mupirocin ointment  1 application Nasal BID  . pantoprazole  40 mg Oral BID  . potassium chloride  40 mEq Oral Once  . predniSONE  5 mg Oral Daily  . rosuvastatin  5 mg Oral QPM  . sodium chloride flush  3 mL Intravenous Q12H  . tacrolimus  0.5 mg Oral Daily  . tamsulosin  0.4 mg Oral QPC supper  . vitamin B-12  100 mcg Oral Daily    Time spent: 25 min  Bethena Roys, MD [phone] [facility] Pager [phone]. If 7PM-7AM, please contact night-coverage at [email or web address], Office  [phone]  password [email or web address] 05/12/2016, 5:09 PM  LOS: 3 days

## 2016-05-12 NOTE — Care Management Note (Signed)
Case Management Note  Patient Details  Name: UNK LYBECK MRN: [id number] Date of Birth: July 26, 1940  Subjective/Objective:  Hyokalemia                  Action/Plan:Plan to discharge home with daughter and [facility]   Expected Discharge Date: 05/14/2016                 Expected Discharge Plan:  [place]  In-House Referral:  Clinical Social Work  Discharge planning Services  CM Consult  Post Acute Care Choice:  Home Health Choice offered to:  Adult Children  DME Arranged:  NIV DME Agency:     HH Arranged:  RN, PT, OT, Nurse's Aide [place] Agency:  [facility]  Status of Service:  In process, will continue to follow  If discussed at Long Length of Stay Meetings, dates discussed:    Additional CommentsPurcell Mouton, RN 05/12/2016, 3:45 PM

## 2016-05-12 NOTE — Consult Note (Signed)
Patient name: Mark [name] MRN: [id number] DOB: 1940/10/03 Sex: male  REASON FOR CONSULT: Osteomyelitis of left foot. Consult is from [employer].  HPI: Mark [name] is a 76 y.o. male, who is known to our practice. He had successful atherectomy and balloon angioplasty using a drug coated balloon of the left popliteal artery and also angioplasty of the left anterior tibial artery on 11/05/2015. He subsequent had amputation of his left great toe including the metatarsal head on 11/06/15 by Dr. Ruta Hinds. The patient was last seen in our office by the nurse practitioner on 04/15/2016. The patient was to have continued dressing changes to both feet by the home health nurse and was to return in 6 months for noninvasive studies.  The patient was admitted on 05/08/2016 with elevated blood sugars, nausea and vomiting, and malaise. The patient has a history of diabetes, chronic atrial fibrillation, and chronic kidney disease. Reportedly the patient has had a progressive decline in his overall health over the last year. He did have a UTI with greater than 100,000 colonies of yeast on admission. He has wounds on both toes and x-ray of the left foot showed evidence of osteomyelitis.  On my history, the patient denies any significant foot pain. He denies fever or chills. His activity is very limited. He tells me that he has essentially been nonambulatory recently.  Past Medical History:  Diagnosis Date  . Acute pancreatitis 02/12/2016  . Chronic diastolic CHF (congestive heart failure) ([place])   . CKD (chronic kidney disease) stage 4, GFR 15-29 ml/min (HCC)    kidney transplant  with recent HD now off  . Diabetes ([place])   . Diabetic neuropathy ([place])   . Gout   . Hypertension   . Hypothyroidism   . 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 Permanent atrial fibrillation ([place]) 02/12/2016  . PVD (peripheral vascular disease) ([place]) 11/05/2015   Successful atherectomy and drug coated balloon angioplasty of  a left popliteal stenosis and angioplasty of a left anterior tibial artery that was occluded.  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 Ulcer of other part of foot 12/15/2013    Family History  Problem Relation Age of Onset  . Bradycardia Mother   . Stroke Father     SOCIAL HISTORY: Social History   Social History  . Marital status: Widowed    Spouse name: N/A  . Number of children: N/A  . Years of education: N/A   Occupational History  . Not on file.   Social History Main Topics  . Smoking status: Never Smoker  . Smokeless tobacco: Never Used  . Alcohol use No  . Drug use: No  . Sexual activity: Not on file   Other Topics Concern  . Not on file   Social History Narrative  . No narrative on file    Allergies  Allergen Reactions  . Tape Other (See Comments)    SKIN WILL TEAR!!    Current Facility-Administered Medications  Medication Dose Route Frequency Provider Last Rate Last Dose  . acetaminophen (TYLENOL) tablet 650 mg  650 mg Oral Q6H PRN Vianne Bulls, MD      . allopurinol (ZYLOPRIM) tablet 150 mg  150 mg Oral Daily Vianne Bulls, MD   150 mg at 05/12/16 0918  . aspirin chewable tablet 81 mg  81 mg Oral QHS Vianne Bulls, MD   81 mg at 05/11/16 2249  .  Chlorhexidine Gluconate Cloth 2 % PADS 6 each  6 each Topical Q0600 Bethena Roys, MD   6 each at 05/12/16 [phone]  . feeding supplement (BOOST / RESOURCE BREEZE) liquid 1 Container  1 Container Oral TID WC Oswald Hillock, MD   1 Container at 05/12/16 1700  . fenofibrate tablet 160 mg  160 mg Oral Daily Vianne Bulls, MD   160 mg at 05/12/16 [id number]  . fluconazole (DIFLUCAN) tablet 100 mg  100 mg Oral Daily Ejiroghene E Emokpae, MD   100 mg at 05/12/16 0918  . heparin injection 5,000 Units  5,000 Units Subcutaneous Q8H Vianne Bulls, MD   5,000 Units at 05/12/16 1429  . HYDROcodone-acetaminophen (NORCO/VICODIN) 5-325 MG per tablet 1-2 tablet  1-2  tablet Oral Q4H PRN Vianne Bulls, MD   2 tablet at 05/10/16 1503  . insulin aspart (novoLOG) injection 0-20 Units  0-20 Units Subcutaneous TID WC Ejiroghene Arlyce Dice, MD   3 Units at 05/12/16 1646  . insulin aspart (novoLOG) injection 0-5 Units  0-5 Units Subcutaneous QHS Vianne Bulls, MD   2 Units at 05/11/16 2259  . insulin glargine (LANTUS) injection 15 Units  15 Units Subcutaneous QHS Bethena Roys, MD   15 Units at 05/11/16 2249  . ipratropium-albuterol (DUONEB) 0.5-2.5 (3) MG/3ML nebulizer solution 3 mL  3 mL Nebulization Q4H PRN Ilene Qua Opyd, MD      . levothyroxine (SYNTHROID, LEVOTHROID) tablet 125 mcg  125 mcg Oral QAC breakfast Vianne Bulls, MD   125 mcg at 05/12/16 [id number]  . metoprolol succinate (TOPROL-XL) 24 hr tablet 50 mg  50 mg Oral Daily Vianne Bulls, MD   50 mg at 05/12/16 [id number]  . multivitamin (RENA-VIT) tablet 1 tablet  1 tablet Oral QHS Vianne Bulls, MD   1 tablet at 05/11/16 2249  . mupirocin ointment (BACTROBAN) 2 % 1 application  1 application Nasal BID Bethena Roys, MD   1 application at [id number]  . ondansetron (ZOFRAN) tablet 4 mg  4 mg Oral Q6H PRN Vianne Bulls, MD       Or  . ondansetron (ZOFRAN) injection 4 mg  4 mg Intravenous Q6H PRN Vianne Bulls, MD      . pantoprazole (PROTONIX) EC tablet 40 mg  40 mg Oral BID Vianne Bulls, MD   40 mg at 05/12/16 0918  . predniSONE (DELTASONE) tablet 5 mg  5 mg Oral Daily Vianne Bulls, MD   5 mg at 05/12/16 [id number]  . rosuvastatin (CRESTOR) tablet 5 mg  5 mg Oral QPM Vianne Bulls, MD   5 mg at 05/12/16 1646  . sodium chloride flush (NS) 0.9 % injection 3 mL  3 mL Intravenous Q12H Ilene Qua Opyd, MD   3 mL at 05/11/16 1046  . tacrolimus (PROGRAF) capsule 0.5 mg  0.5 mg Oral Daily Ejiroghene E Denton Brick, MD   0.5 mg at 05/12/16 0918  . tamsulosin (FLOMAX) capsule 0.4 mg  0.4 mg Oral QPC supper Vianne Bulls, MD   0.4 mg at 05/12/16 1646  . vitamin B-12 (CYANOCOBALAMIN) tablet 100 mcg  100 mcg Oral  Daily Vianne Bulls, MD   100 mcg at 05/12/16 [id number]    REVIEW OF SYSTEMS:  [X]  denotes positive finding, [ ]  denotes negative finding Cardiac  Comments:  Chest pain or chest pressure:    Shortness of breath upon exertion: X   Short of breath when lying flat:  Irregular heart rhythm:        Vascular    Pain in calf, thigh, or hip brought on by ambulation:    Pain in feet at night that wakes you up from your sleep:     Blood clot in your veins:    Leg swelling:         Pulmonary    Oxygen at home:    Productive cough:     Wheezing:         Neurologic    Sudden weakness in arms or legs:     Sudden numbness in arms or legs:     Sudden onset of difficulty speaking or slurred speech:    Temporary loss of vision in one eye:     Problems with dizziness:         Gastrointestinal    Blood in stool:     Vomited blood:         Genitourinary    Burning when urinating:     Blood in urine:        Psychiatric    Major depression:         Hematologic    Bleeding problems:    Problems with blood clotting too easily:        Skin    Rashes or ulcers: X       Constitutional    Fever or chills:      PHYSICAL EXAM: Vitals:   05/11/16 0629 05/11/16 2218 05/12/16 0609 05/12/16 1321  BP:  (!) 113/41 (!) 145/60 (!) 147/77  Pulse:  78 60 77  Resp:  16 16 17   Temp:  97.6 F (36.4 C) 98.1 F (36.7 C) 97.7 F (36.5 C)  TempSrc:  Oral Axillary Oral  SpO2:  100% 99% 100%  Weight: 161 lb 13.1 oz (73.4 kg)  175 lb 11.3 oz (79.7 kg)   Height:        GENERAL: The patient is a well-nourished male, in no acute distress. The vital signs are documented above. CARDIAC: There is a regular rate and rhythm.  VASCULAR: I do not detect carotid bruits. On the left side, which is the site of concern, I cannot palpate a femoral pulse, popliteal pulse, or pedal pulses. On the right side, he has a palpable femoral pulse. I cannot palpate a popliteal or pedal pulses. He has mild bilateral lower  extremity swelling. PULMONARY: There is good air exchange bilaterally without wheezing or rales. ABDOMEN: Soft and non-tender with normal pitched bowel sounds.  MUSCULOSKELETAL: He has had a previous left great toe amputation. NEUROLOGIC: No focal weakness or paresthesias are detected. SKIN: He has dry gangrene of the tip of the left second toe. There is a wound on the lateral aspect of his left foot and also over the medial aspect of the left foot. He has a very superficial ulceration on the right great toe. PSYCHIATRIC: The patient has a normal affect.  DATA:   X-RAY RIGHT FOOT: This shows no evidence of active osteomyelitis of the right great toe.  X-RAY LEFT FOOT: This shows evidence of active osteomyelitis involving the distal phalanx of the left second toe.  MEDICAL ISSUES:  OSTEOMYELITIS AT THE DISTAL PHALANX OF THE LEFT SECOND TOE: This patient has dry gangrene of the tip of the left second toe with osteomyelitis by x-ray. However this does not appear to be a source of sepsis. In addition the patient has no interest on having a toe amputation. Even if he  was agreeable and concerned that he may not have adequate circulation to heal this. He is markedly debilitated and knotted candidate for further attempts at revascularization. I will obtain ABIs to compare to the study that he had back in December. The arteries on the left were noncompressible. He did have monophasic signals in the posterior tibial and dorsalis pedis position at that time. He is in isolation and therefore I could not interrogate with the Doppler to bedside myself tonight. I'll be sure that he has continued follow up in our office as previously scheduled. However again, at this point the patient is not interested in having toe amputation.  Deitra Mayo [facility] [phone]

## 2016-05-12 NOTE — Progress Notes (Signed)
  Echocardiogram 2D Echocardiogram has been performed. Technically difficult due to patient skin sensitivity.   Mark [name] L Androw 05/12/2016, 10:37 AM

## 2016-05-12 NOTE — Consult Note (Signed)
   [facility] Inpatient Consult   05/12/2016  BOLTON HOUSEHOLDER 28-Sep-1940 [id number]    Mr. Tewksbury screened for [facility] Management program. Initially spoke with Mr Slaybaugh about [facility] Management program and he advised writer to contact his daughter, Freada Bergeron. [place] who is agreeable to [facility] [place] Management services. However, Mr. Hiatt signature was needed to consent. Spoke with Mr. Whack to make him aware that his daughter is in agreement with [facility] [facility] Management follow up, while his daughter was on speaker phone. Mr. Heggen then says, " I do not need your services, that is just a hustle, you can not help me". Therefore, he declined signing consent. [place] aware and left brochure at bedside for them to call should [place] Management services are desired in the future. Made inpatient RNCM aware that Mr. Miklas declined [facility] [place] Management services.    Marthenia Rolling, MSN-Ed, RN,BSN [facility] Liaison [phone]

## 2016-05-13 ENCOUNTER — Inpatient Hospital Stay (HOSPITAL_COMMUNITY): Payer: Medicare Other

## 2016-05-13 DIAGNOSIS — Z794 Long term (current) use of insulin: Secondary | ICD-10-CM

## 2016-05-13 DIAGNOSIS — E1122 Type 2 diabetes mellitus with diabetic chronic kidney disease: Secondary | ICD-10-CM

## 2016-05-13 DIAGNOSIS — E039 Hypothyroidism, unspecified: Secondary | ICD-10-CM

## 2016-05-13 DIAGNOSIS — N390 Urinary tract infection, site not specified: Secondary | ICD-10-CM

## 2016-05-13 DIAGNOSIS — E1165 Type 2 diabetes mellitus with hyperglycemia: Secondary | ICD-10-CM

## 2016-05-13 DIAGNOSIS — Z992 Dependence on renal dialysis: Secondary | ICD-10-CM

## 2016-05-13 DIAGNOSIS — N185 Chronic kidney disease, stage 5: Secondary | ICD-10-CM

## 2016-05-13 DIAGNOSIS — N186 End stage renal disease: Secondary | ICD-10-CM

## 2016-05-13 DIAGNOSIS — Z940 Kidney transplant status: Secondary | ICD-10-CM

## 2016-05-13 DIAGNOSIS — R531 Weakness: Secondary | ICD-10-CM

## 2016-05-13 DIAGNOSIS — E43 Unspecified severe protein-calorie malnutrition: Secondary | ICD-10-CM

## 2016-05-13 DIAGNOSIS — R739 Hyperglycemia, unspecified: Secondary | ICD-10-CM

## 2016-05-13 LAB — GLUCOSE, CAPILLARY
GLUCOSE-CAPILLARY: 52 mg/dL — AB (ref 65–99)
GLUCOSE-CAPILLARY: 52 mg/dL — AB (ref 65–99)
GLUCOSE-CAPILLARY: 83 mg/dL (ref 65–99)
GLUCOSE-CAPILLARY: 270 mg/dL — AB (ref 65–99)
Glucose-Capillary: 98 mg/dL (ref 65–99)
Glucose-Capillary: 116 mg/dL — ABNORMAL HIGH (ref 65–99)

## 2016-05-13 MED ORDER — FLUCONAZOLE 100 MG PO TABS: 200.0000 mg | ORAL_TABLET | Freq: Every day | ORAL | Status: DC

## 2016-05-13 MED ORDER — GLUCOSE 40 % PO GEL
1.0000 | Freq: Once | ORAL | Status: AC
  Administered 2016-05-13: 37.5 g via ORAL
  Filled 2016-05-13: qty 1

## 2016-05-13 MED ORDER — ENSURE ENLIVE PO LIQD
237.0000 mL | Freq: Two times a day (BID) | ORAL | Status: DC
  Administered 2016-05-13: 237 mL via ORAL

## 2016-05-13 MED ORDER — BOOST / RESOURCE BREEZE PO LIQD
1.0000 | Freq: Two times a day (BID) | ORAL | Status: DC
  Administered 2016-05-13 – 2016-05-14 (×2): 1 via ORAL

## 2016-05-13 MED ORDER — FLUCONAZOLE 100 MG PO TABS
100.0000 mg | ORAL_TABLET | Freq: Every day | ORAL | Status: DC
  Administered 2016-05-14: 100 mg via ORAL
  Filled 2016-05-13: qty 1

## 2016-05-13 NOTE — Progress Notes (Signed)
RN notified by CCMD that pt. Had 5 beat run of Vtach. Pt. Had no symptoms. On call NP Schorr made aware. Will continue to monitor pt. Closely.

## 2016-05-13 NOTE — Progress Notes (Signed)
PROGRESS NOTE    Mark [name]   [id number]  DOB: 08-09-1940  DOA: 05/08/2016 PCP: Rochel Brome, MD   Brief Narrative:  76 y.o.malewith medical history significant for insulin-dependent diabetes mellitus, hypertension, chronic atrial fibrillation, renal transplant in 2005 with chronic kidney disease stage IV, now presenting to the emergency department for evaluation of  nausea, vomiting, and hyperglycemia. Has not been checking SBGs at home.w hen checked by [facility], found to have glucose 566. K found to be 2.7 in ER.   Subjective: No complaints today. States multiple times that he his not going to have his toes cut off. No nausea or vomiting. Poor appetite yesterday and did not eat much dinner but ate all of his breakfast this AM. Uses a wheelchair at baseline and is home with a caretaker (12 hrs a day).  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, PVD - vascular surgery called- ABI ordered but vessels are not compressible - patient does not want surgery and is wheelchair bound    Hypothyroidism - TSH normal- cont synthroid    Renal transplant recipient- stage 4 CKD - cont Tracolimus, prednisone    Protein-calorie malnutrition, severe - nutritionist eval requested    Chronic atrial fibrillation  - CHA2DS2-VASc Score 2- taken off of Coumadin recently due to non- compliance     Wide-complex tachycardia   - 6 beat run of V tach- seen by cardiology - no further work up  DVT prophylaxis: Heparin Code Status: Full code Family Communication:  Disposition Plan: home  Consultants:   Cardiology, vascular surgery Procedures:     Antimicrobials:  Anti-infectives    Start     Dose/Rate Route Frequency Ordered Stop   05/14/16 1000  fluconazole (DIFLUCAN) tablet 100 mg     100 mg Oral Daily 05/13/16 1032     05/13/16 1000  fluconazole (DIFLUCAN) tablet 200 mg  Status:  Discontinued     200 mg Oral Daily 05/13/16 0753 05/13/16 1032   05/11/16 1515  fluconazole (DIFLUCAN) tablet 100 mg  Status:  Discontinued     100 mg Oral Daily 05/11/16 1504 05/13/16 0753   05/09/16 1000  cefTRIAXone (ROCEPHIN) 1 g in dextrose 5 % 50 mL IVPB  Status:  Discontinued     1 g 100 mL/hr over 30 Minutes Intravenous Every 24 hours 05/09/16 0907 05/11/16 1203       Objective: Vitals:   05/12/16 2125 05/13/16 0533 05/13/16 0620 05/13/16 1239  BP: (!) 148/86 137/72 (!) 154/86 (!) 127/58  Pulse: 87 73 61 75  Resp: 18 18 18 19   Temp: 98.1 F (36.7 C) 97.6 F (36.4 C) 97.8 F (36.6 C) 97.6 F (36.4 C)  TempSrc: Oral Oral Oral Oral  SpO2: 100% 100% 100% 100%  Weight:  78.9 kg (173 lb 15.1 oz)    Height:        Intake/Output Summary (Last 24 hours) at 05/13/16 1421 Last data filed at 05/13/16 1241  Gross per 24 hour  Intake              480 ml  Output              600  ml  Net             -120 ml   Filed Weights   05/11/16 0629 05/12/16 0609 05/13/16 0533  Weight: 73.4 kg (161 lb 13.1 oz) 79.7 kg (175 lb 11.3 oz) 78.9 kg (173 lb 15.1 oz)    Examination: General exam: Appears comfortable  HEENT: PERRLA, oral mucosa moist, no sclera icterus or thrush Respiratory system: Clear to auscultation. Respiratory effort normal. Cardiovascular system: S1 & S2 heard, RRR.  No murmurs  Gastrointestinal system: Abdomen soft, non-tender, nondistended. Normal bowel sound. No organomegaly Central nervous system: Alert and oriented. No focal neurological deficits. 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     Data Reviewed: I have personally reviewed following labs and imaging studies  CBC:  Recent Labs Lab 05/08/16 1700 05/09/16 0226 05/10/16 0513 05/11/16 0527  WBC 5.0 4.5 5.7 5.3  NEUTROABS 3.5 2.8  --   --   HGB 11.9* 10.9* 11.9* 11.5*  HCT 35.6* 31.5* 34.4* 35.1*  MCV 91.5 91.0 90.5 91.2  PLT 153 146* 134* [id number]*   Basic Metabolic Panel:  Recent Labs Lab 05/09/16 0226 05/09/16 0850 05/10/16 0513 05/11/16 0527 05/12/16 0824  NA 142 144 143 143 141  K 3.1* 3.3* 3.9 3.9 3.3*  CL 118* 118* 117* 116* 116*  CO2 18* 20* 20* 21* 19*  GLUCOSE 320* 125* 360* 368* 97  BUN 39* 37* 35* 39* 38*  CREATININE 1.45* 1.38* 1.39* 1.56* 1.20  CALCIUM 8.4* 8.6* 8.4* 8.5* 8.0*  MG 2.0  --   --   --  1.4*   GFR: Estimated Creatinine Clearance: 52.4 mL/min (by C-G formula based on SCr of 1.2 mg/dL). Liver Function Tests:  Recent Labs Lab 05/08/16 1700 05/11/16 0527  AST 26 18  ALT 11* 10*  ALKPHOS 39 38  BILITOT 1.2 0.9  PROT 5.8* 5.4*  ALBUMIN 2.5* 2.5*   No results for input(s): LIPASE, AMYLASE in the last 168 hours. No results for input(s): AMMONIA in the last 168 hours. Coagulation Profile:  Recent Labs Lab 05/08/16 1700  INR 1.77   Cardiac Enzymes:  Recent Labs Lab 05/08/16 2145 05/11/16 1611  TROPONINI <0.03 <0.03   BNP (last 3 results) No results for input(s): PROBNP in the last 8760 hours. HbA1C: No results for input(s): HGBA1C in the last 72 hours. CBG:  Recent Labs Lab 05/12/16 2132 05/13/16 0742 05/13/16 0815 05/13/16 0907 05/13/16 1152  GLUCAP 265* 52* 52* 83 98   Lipid Profile: No results for input(s): CHOL, HDL, LDLCALC, TRIG, CHOLHDL, LDLDIRECT in the last 72 hours. Thyroid Function Tests: No results for input(s): TSH, T4TOTAL, FREET4, T3FREE, THYROIDAB in the last 72 hours. Anemia Panel: No results for input(s): VITAMINB12, FOLATE, FERRITIN, TIBC, IRON, RETICCTPCT in the last 72 hours. Urine analysis:    Component  Value Date/Time   COLORURINE YELLOW 05/09/2016 0653   APPEARANCEUR HAZY (A) 05/09/2016 0653   LABSPEC 1.011 05/09/2016 0653   PHURINE 5.0 05/09/2016 0653   GLUCOSEU >=500 (A) 05/09/2016 0653   GLUCOSEU NEGATIVE 10/18/2012 0857   HGBUR SMALL (A) 05/09/2016 0653   BILIRUBINUR NEGATIVE 05/09/2016 0653   KETONESUR NEGATIVE 05/09/2016 0653   PROTEINUR 30 (A) 05/09/2016 0653   UROBILINOGEN 0.2 10/18/2012 0857   NITRITE NEGATIVE 05/09/2016 0653   LEUKOCYTESUR LARGE (A) 05/09/2016 0653   Sepsis Labs: @LABRCNTIP (procalcitonin:4,lacticidven:4) ) Recent Results (  from the past 240 hour(s))  Culture, blood (routine x 2)     Status: None (Preliminary result)   Collection Time: 05/08/16  9:45 PM  Result Value Ref Range Status   Specimen Description BLOOD RIGHT HAND  Final   Special Requests BOTTLES DRAWN AEROBIC ONLY 5CC  Final   Culture   Final    NO GROWTH 3 DAYS Performed at [facility], [address], [place], [place] [address]    Report Status PENDING  Incomplete  Culture, blood (routine x 2)     Status: None (Preliminary result)   Collection Time: 05/08/16  9:45 PM  Result Value Ref Range Status   Specimen Description BLOOD RIGHT HAND  Final   Special Requests IN PEDIATRIC BOTTLE 2CC  Final   Culture   Final    NO GROWTH 3 DAYS Performed at [facility], [place] [address], [place], [place] [address]    Report Status PENDING  Incomplete  MRSA PCR Screening     Status: Abnormal   Collection Time: 05/08/16 11:56 PM  Result Value Ref Range Status   MRSA by PCR POSITIVE (A) NEGATIVE Final    Comment:        The GeneXpert MRSA Assay (FDA approved for NASAL specimens only), is one component of a comprehensive MRSA colonization surveillance program. It is not intended to diagnose MRSA infection nor to guide or monitor treatment for MRSA infections. RESULT CALLED TO, READ BACK BY AND VERIFIED WITH: OBRYANT,M RN 2.3.18 @0210  ZANDO,C   Culture, Urine     Status:  Abnormal   Collection Time: 05/09/16  2:49 PM  Result Value Ref Range Status   Specimen Description URINE, RANDOM  Final   Special Requests NONE  Final   Culture >=100,000 COLONIES/mL YEAST (A)  Final   Report Status 05/10/2016 FINAL  Final         Radiology Studies: Dg Toe 2nd Left  Result Date: 05/12/2016 CLINICAL DATA:  Wound on the tip of the left second toe, diabetes EXAM: LEFT SECOND TOE COMPARISON:  Left foot films of 03/17/2016 FINDINGS: There has been erosion of the majority of the distal phalanx of the left second toe consistent with active osteomyelitis. The bones are diffusely osteopenic. No other abnormality is seen. Arterial calcifications are present diffusely. IMPRESSION: Active osteomyelitis involves the distal phalanx of the left second toe. Electronically Signed   By: Ivar Drape M.D.   On: 05/12/2016 15:01   Dg Toe Great Right  Result Date: 05/12/2016 CLINICAL DATA:  Wound on the plantar aspect of the right great toe and second toe, the patient is diabetic EXAM: RIGHT GREAT TOE COMPARISON:  Right foot films of 03/17/2016 FINDINGS: The bones of the right foot are diffusely osteopenic. There is degenerative change of the right first MTP joint B degenerative change in the DIP joints as well. However, no erosion or focal demineralization is seen to indicate osteomyelitis. Arterial calcifications are present diffusely. IMPRESSION: 1. No definite radiographic evidence of osteomyelitis is seen. 2. Degenerative change diffusely.  Osteopenia. Electronically Signed   By: Ivar Drape M.D.   On: 05/12/2016 15:00      Scheduled Meds: . allopurinol  150 mg Oral Daily  . aspirin  81 mg Oral QHS  . Chlorhexidine Gluconate Cloth  6 each Topical Q0600  . feeding supplement  1 Container Oral BID WC  . feeding supplement (ENSURE ENLIVE)  237 mL Oral BID BM  . fenofibrate  160 mg Oral  Daily  . [START ON 05/14/2016] fluconazole  100 mg Oral Daily  . heparin  5,000 Units Subcutaneous Q8H   . insulin aspart  0-20 Units Subcutaneous TID WC  . insulin aspart  0-5 Units Subcutaneous QHS  . insulin glargine  15 Units Subcutaneous QHS  . levothyroxine  125 mcg Oral QAC breakfast  . metoprolol succinate  50 mg Oral Daily  . multivitamin  1 tablet Oral QHS  . mupirocin ointment  1 application Nasal BID  . pantoprazole  40 mg Oral BID  . predniSONE  5 mg Oral Daily  . rosuvastatin  5 mg Oral QPM  . sodium chloride flush  3 mL Intravenous Q12H  . tacrolimus  0.5 mg Oral Daily  . tamsulosin  0.4 mg Oral QPC supper  . vitamin B-12  100 mcg Oral Daily   Continuous Infusions:   LOS: 4 days    Time spent in minutes: 25    [place], MD [facility] Pager: [email or web address] Password [email or web address] 05/13/2016, 2:21 PM

## 2016-05-13 NOTE — Progress Notes (Signed)
Nutrition Follow-up  DOCUMENTATION CODES:   Severe malnutrition in context of acute illness/injury  INTERVENTION:  Recommend liberalizing diet from Renal/Carbohydrate Modified to only Carbohydrate Modified.  Continue Boost Breeze po but decrease to BID, each supplement provides 250 kcal and 9 grams of protein.  Provide Ensure Enlive po BID, each supplement provides 350 kcal and 20 grams of protein.  Reviewed menu with patient. Encouraged adequate intake of calories and protein through meals and beverages to meet increased needs for healing. RD made patient an Assist with meals in [facility] and left note to help him order protein with each meal.  NUTRITION DIAGNOSIS:   Increased nutrient needs related to wound healing as evidenced by estimated needs.  Ongoing.  GOAL:   Patient will meet greater than or equal to 90% of their needs  Not met.  MONITOR:   PO intake, Supplement acceptance, Labs, Weight trends, Skin, I & O's  REASON FOR ASSESSMENT:   Malnutrition Screening Tool    ASSESSMENT:   76 y.o. male with medical history significant for insulin-dependent diabetes mellitus, hypertension, chronic atrial fibrillation, renal transplant in 2005 with chronic kidney disease stage IV, now presenting to the emergency department for evaluation of malaise, nausea, vomiting, and hyperglycemia. Patient describes a progressive decline in his overall health over the past year  RD has been following patient for MST. RD now consulted for Poor PO intake and wound healing.  Spoke with patient at bedside. He reports his appetite remains poor. He reports he is feeling down and does not like the food on the menu. Patient denies N/V or abdominal pain. He reports diarrhea, but that it is not affecting his appetite. Patient requesting milk shake. Patient is amenable to adding Ensure Enlive daily and also to ordering a protein source at each meal. Discussed items on menu patient will enjoy  including omelette, burger, fish, and grilled cheese.  Meal Completion: 25-100% of small meals with inadequate calories and protein. He did drink 2 bottles of Boost Breeze yesterday. In the past 24 hours patient has had approximately 872 kcal (46% minimum estimated kcal needs) and 25 grams of protein (28% minimum estimated protein needs).   Medications reviewed and include: allopurinol, Novolog sliding scale TID with meals and daily at bedtime, Lantus 15 units daily at bedtime, levothyroxine, Rena-Vit 1 tablet daily, pantoprazole, prednisone 5 mg daily, tacrolimus 0.5 mg daily, vitamin B12 100 micrograms daily.  Labs reviewed: CBG 83-265 past 24 hrs. On 2/6 Potassium 3.3.  Weight trend: 78.9 kg on 2/7 (+5.4 kg since admission weight of 73.5 kg)  Patient now meets criteria for severe acute malnutrition in setting of energy intake </=50% of estimated energy requirement for >/=5 days and moderate muscle wasting.  Discussed with RN.   Diet Order:  Diet Carb Modified Fluid consistency: Thin; Room service appropriate? Yes  Skin:  Wound (see comment) (2/3 Lt foot wound)  Last BM:  05/12/2016  Height:   Ht Readings from Last 1 Encounters:  05/08/16 '5\' 9"'  (1.753 m)    Weight:   Wt Readings from Last 1 Encounters:  05/13/16 173 lb 15.1 oz (78.9 kg)    Ideal Body Weight:  72.7 kg  BMI:  Body mass index is 25.69 kg/m.  Estimated Nutritional Needs:   Kcal:  1900-2100  Protein:  90-100g  Fluid:  2L/day  EDUCATION NEEDS:   No education needs identified at this time  Willey Blade, MS, RD, LDN Pager: [phone] After Hours Pager: [phone]

## 2016-05-13 NOTE — Care Management Important Message (Addendum)
Important Message  Patient Details IM Letter given to Cookie/Case Manager to present to Patient.  Name: Mark [name] MRN: [id number] Date of Birth: December 01, 1940   Medicare Important Message Given:  Yes    Kerin Salen 05/13/2016, 11:50 AMImportant Message  Patient Details  Name: Mark [name] MRN: [id number] Date of Birth: 12-25-1940   Medicare Important Message Given:  Yes    Kerin Salen 05/13/2016, 11:50 AM

## 2016-05-13 NOTE — Progress Notes (Signed)
Patient has refused labs x2 this AM. Attempted to educate patient regarding necessity of labs; patient still refuses.

## 2016-05-13 NOTE — Progress Notes (Signed)
Inpatient Diabetes Program Recommendations  AACE/ADA: New Consensus Statement on Inpatient Glycemic Control (2015)  Target Ranges:  Prepandial:   less than 140 mg/dL      Peak postprandial:   less than 180 mg/dL (1-2 hours)      Critically ill patients:  140 - 180 mg/dL   Results for JOSEFINA, RAFFETTO (MRN [id number]) as of 05/13/2016 09:02  Ref. Range 05/12/2016 07:36 05/12/2016 12:12 05/12/2016 16:46 05/12/2016 21:32  Glucose-Capillary Latest Ref Range: 65 - 99 mg/dL 82 115 (H) 184 (H) 265 (H)   Results for TYDARIUS, MINAS (MRN [id number]) as of 05/13/2016 09:02  Ref. Range 05/13/2016 07:42 05/13/2016 08:15  Glucose-Capillary Latest Ref Range: 65 - 99 mg/dL 52 (L) 52 (L)    Diabetes history: DM 2 (Sees Dr [facility])  Outpatient DM meds: Lantus 14 units QHS                Novolog SSI tid ac    Novolog 6 units TID with meals  Current Insulin Orders: Lantus 15 units QHS      Novolog Resistant Correction Scale/ SSI (0-20 units) TID AC + HS      -Per Dr. Ronnie Derby note on 04/29/16, patient was instructed to increase his Lantus to 14 units QHS and patient was also instructed to take Novolog 6 units with each meal in addition to [place].   -Patient currently with poor PO intake.      MD- Patient with Hypoglycemia this AM- CBG down to 52 mg/dl.  Received 15 units Lantus last PM.  Please consider the following insulin adjustments:  1. Reduce Lantus to 10 unts QHS  2. Reduce Novolog Correction Scale/ SSI to Modeate scale (0-15 units) TID AC + HS      --Will follow patient during hospitalization--  Wyn Quaker RN, MSN, CDE Diabetes Coordinator Inpatient Glycemic Control Team Team Pager: [phone] (8a-5p)

## 2016-05-13 NOTE — Progress Notes (Signed)
*  PRELIMINARY RESULTS* Vascular Ultrasound Ankle Brachial Index has been completed.  Technically difficult study. Right brachial could not be occluded, Left upper extremity is restricted.  Lower extremity vessels are likely all non compressible however the posterior tibial veins were difficult to evaluate due to patient cooperation. Dampened monophasic flow in the right great two and left second toe.  Unable to accurately calculate ABI's.  Everrett Coombe 05/13/2016, 12:18 PM

## 2016-05-13 NOTE — Progress Notes (Addendum)
Hypoglycemic Event  CBG: 52  Treatment: 15 GM carbohydrate snack    Symptoms: Nervous/irritable  Follow-up CBG: Time:815 CBG Result:52  Possible Reasons for Event: Inadequate meal intake  Comments/MD notified:Will notify on rounding  Administered oral glucose gel upon rechecking CBG. Patient CBG is now 83 at 907.  Will continue to monitor.     Mark [name]

## 2016-05-14 LAB — TACROLIMUS LEVEL: TACROLIMUS (FK506) - LABCORP: 6.1 ng/mL (ref 2.0–20.0)

## 2016-05-14 LAB — CULTURE, BLOOD (ROUTINE X 2)
Culture: NO GROWTH
Culture: NO GROWTH

## 2016-05-14 LAB — GLUCOSE, CAPILLARY
GLUCOSE-CAPILLARY: 307 mg/dL — AB (ref 65–99)
Glucose-Capillary: 234 mg/dL — ABNORMAL HIGH (ref 65–99)

## 2016-05-14 MED ORDER — FLUCONAZOLE 100 MG PO TABS: 100.0000 mg | ORAL_TABLET | Freq: Every day | ORAL | 0 refills | Status: AC

## 2016-05-14 NOTE — Progress Notes (Signed)
Physical Therapy Treatment Patient Details Name: Mark [name] MRN: [id number] DOB: 10-24-1940 Today's Date: 05/14/2016    History of Present Illness 75 yo male admitted with hypokalemia, hyperglycemia. Hx of L necrotic foot wound, gout, A fib, kidney transplant, L toe amputation, DM, HTN, CHF, CKD, neuropathy, MRSA    PT Comments    Pt required MAX ENCOURAGEMENT to get OOB.  Bribed him with a warm wash cloth for his face and washed his back.  Required + 2 assist to get to EOB pt 5%.  Once upright pt required Min Assist to maintain static sitting balance demonstrating L lean and kyphotic posture.  Unable to attempt stand pivot with just the hospital footies, performed a lateral scoot from elevated bed to drop arm recliner + 2 assist utilizing bed pad pt 10%.  Hoyer pad placed in recliner under pt  for nursing to use lift to assist back to bed.  Positioned to comfort,   Follow Up Recommendations  SNF (per chart review, pt is returning home.  )     Equipment Recommendations  None recommended by PT (has everything at home including a lift, stated pt)    Recommendations for Other Services       Precautions / Restrictions Precautions Precautions: Fall Required Braces or Orthoses: Other Brace/Splint Other Brace/Splint: L DARCO per chart and pt--not in room, pt reports it is 'at home" Restrictions Weight Bearing Restrictions: No    Mobility  Bed Mobility Overal bed mobility: Needs Assistance Bed Mobility: Supine to Sit     Supine to sit: +2 for physical assistance;+2 for safety/equipment;Max assist     General bed mobility comments: pt not interested in offering any self assist.  Sat pt EOB to wash face and back.  Fair sitting balance with L lean.  Held to rail to steady self.    Transfers Overall transfer level: Needs assistance Equipment used: None Transfers: Lateral/Scoot Transfers          Lateral/Scoot Transfers: Max assist;+2 physical assistance;+2  safety/equipment;From elevated surface General transfer comment: from elevated bed to drop arm recliner utilizing bed pad to laterally scoot.  Pt 10%   Ambulation/Gait                 Stairs            Wheelchair Mobility    Modified Rankin (Stroke Patients Only)       Balance                                    Cognition Arousal/Alertness: Awake/alert Behavior During Therapy: WFL for tasks assessed/performed Overall Cognitive Status: Within Functional Limits for tasks assessed                 General Comments: pt is generally unmotivated adn verbal regarding such; he requires maximum, firm encouragement to perform any physical activity    Exercises      General Comments        Pertinent Vitals/Pain Pain Assessment: Faces Faces Pain Scale: Hurts little more Pain Location: LEs with activity and back Pain Descriptors / Indicators: Discomfort;Nagging Pain Intervention(s): Monitored during session;Repositioned    Home Living                      Prior Function            PT Goals (current goals can now be found in the  care plan section) Progress towards PT goals: Progressing toward goals    Frequency    Min 3X/week      PT Plan Current plan remains appropriate    Co-evaluation             End of Session Equipment Utilized During Treatment: Gait belt Activity Tolerance: Patient tolerated treatment well Patient left: in chair;with call bell/phone within reach     Time: 0935-1000 PT Time Calculation (min) (ACUTE ONLY): 25 min  Charges:  $Therapeutic Activity: 23-37 mins                    G Codes:      Rica Koyanagi  PTA [facility]  Acute  Rehab Pager      [phone]

## 2016-05-14 NOTE — Discharge Summary (Signed)
Physician Discharge Summary  Mark [name] [id number] DOB: 1941/01/06 DOA: 05/08/2016  PCP: Rochel Brome, MD  Admit date: 05/08/2016 Discharge date: 05/14/2016  Admitted From: home Disposition:  home   Recommendations for Outpatient Follow-up:  1. Due to interaction between Tracolimus and Diflucan, F/u Tracolimus Level drawn on 2/5 and adjust dose as needed  Home Health:  none  Equipment/Devices:  Has equipment at home    Discharge Condition:  stable   CODE STATUS:  DNR   Diet recommendation:  Carb modified Consultations:  none    Discharge Diagnoses:  Principal Problem:   Hypokalemia Active Problems:   Type II diabetes mellitus, uncontrolled ([place])   Hypothyroidism   Renal transplant recipient   Protein-calorie malnutrition, severe   Generalized weakness   Chronic atrial fibrillation (HCC)   ESRD on dialysis ([place])   Hyperglycemia   Malaise and fatigue   Urinary tract infection   PVD (peripheral vascular disease) (HCC)   Wide-complex tachycardia (HCC)    Subjective: No complaints today- re-iterates that he does not want surgery on his feet. Eating well today. No complaints.   Brief Summary: 76 y.o.malewith medical history significant for insulin-dependent diabetes mellitus, hypertension, chronic atrial fibrillation, renal transplant in 2005 with chronic kidney disease stage IV, now presenting to the emergency department for evaluation of  nausea, vomiting, and hyperglycemia. Has not been checking CBGs at home and when checked by [facility], found to have glucose 566. K found to be 2.7 in ER.   Hospital Course:  Principal Problem:   Hypokalemia, vomiting, acidosis with hyperglycemia - early DKA on admission- treated aggressively with insulin and has resolved - advised to check sugars regularly at home and limit intake of carbs - noted to have candida UTI which may have also added to hyperglycemia  - vomiting has resolved- hypokalemia replaced  Active Problems:    Type II diabetes mellitus, uncontrolled - better controlled now on Lantus and Novolog SS  Candida cystitis - large amount of yeast growing in UA- as he is an uncontrolled diabetic on immunosuppression we have decided to treat this as an infection - cont Fluconazole x 2 wks which is renally dosed  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, PVD - he has a number of small eschars on b/l feet but left 2nd toe was concerning and an Xray confirms osteomyelitis of distal phalanx - vascular surgery called- ABI ordered but vessels are not compressible- patient refuses surgery- understands dry gangrene will extend and worsen- necrotic area can get superinfected with bacteria-  will need close f/u as outpt by PCP -  He is also wheelchair bound    Hypothyroidism - TSH normal- cont synthroid    Renal transplant recipient- stage 4 CKD - cont Tracolimus, prednisone    Protein-calorie malnutrition, severe - nutritionist eval requested    Chronic atrial fibrillation  - CHA2DS2-VASc Score 2- taken off of Coumadin recently due to non- compliance     Wide-complex tachycardia   - 6 beat run of V tach- seen by cardiology - no further work up   Discharge Instructions  Discharge Instructions    Diet Carb Modified    Complete by:  As directed    Increase activity slowly    Complete by:  As directed      Allergies as of 05/14/2016      Reactions   Tape Other (See Comments)   SKIN WILL TEAR!!      Medication List    TAKE these medications  acetaminophen 500 MG tablet Commonly known as:  TYLENOL Take 500 mg by mouth every 6 (six) hours as needed for mild pain or moderate pain.   allopurinol 300 MG tablet Commonly known as:  ZYLOPRIM Take 150 mg by mouth in the morning   aspirin 81 MG chewable tablet Chew 81 mg by mouth at bedtime.   feeding supplement Liqd Take 1 Container by mouth 3 (three) times daily with meals.   fenofibrate micronized 134 MG  capsule Commonly known as:  LOFIBRA Take 134 mg by mouth daily before breakfast.   fluconazole 100 MG tablet Commonly known as:  DIFLUCAN Take 1 tablet (100 mg total) by mouth daily. Start taking on:  05/15/2016   insulin aspart 100 UNIT/ML injection Commonly known as:  novoLOG Inject 0-15 Units into the skin daily after breakfast. Per sliding scale: 151-200=4 201-250=6 251-300=8 301-350=10 351-400=12 Over 400=15   insulin glargine 100 UNIT/ML injection Commonly known as:  LANTUS Inject 0.05 mLs (5 Units total) into the skin at bedtime. What changed:  how much to take   levothyroxine 125 MCG tablet Commonly known as:  SYNTHROID, LEVOTHROID Take 125 mcg by mouth every morning.   metoprolol succinate 50 MG 24 hr tablet Commonly known as:  TOPROL-XL Take 50 mg by mouth daily.   multivitamin Tabs tablet Take 1 tablet by mouth at bedtime.   pantoprazole 40 MG tablet Commonly known as:  PROTONIX Take 1 tablet (40 mg total) by mouth daily. What changed:  when to take this   polyethylene glycol packet Commonly known as:  MIRALAX / GLYCOLAX Take 17 g by mouth daily.   predniSONE 5 MG tablet Commonly known as:  DELTASONE Take 5 mg by mouth daily.   PROGRAF 0.5 MG capsule Generic drug:  tacrolimus Take 0.5 mg by mouth 2 (two) times daily.   rosuvastatin 5 MG tablet Commonly known as:  CRESTOR Take 5 mg by mouth every evening.   SURE COMFORT PEN NEEDLES 32G X 4 MM Misc Generic drug:  Insulin Pen Needle USE AS DIRECTED 5 TIMES DAILY SUBQUE.   tamsulosin 0.4 MG Caps capsule Commonly known as:  FLOMAX Take 1 capsule (0.4 mg total) by mouth daily after supper.   terbinafine 1 % cream Commonly known as:  LAMISIL AT Apply 1 application topically 2 (two) times daily. Bilateral feet   vitamin B-12 100 MCG tablet Commonly known as:  CYANOCOBALAMIN Take 100 mcg by mouth daily.      Follow-up [place], PA Follow up.   Specialties:  Physician  Assistant, Cardiology Why:  Cardiology Hospital Follow-up on 05/28/2016 at 2:00PM. (Dr. Evette Georges PA) Contact information: [address] [place] [address] [phone]        Elayne Snare, MD. Schedule an appointment as soon as possible for a visit in 1 week(s).   Specialty:  Endocrinology Why:    Contact information: [address] [place] [address] [phone]          Allergies  Allergen Reactions  . Tape Other (See Comments)    SKIN WILL TEAR!!     Procedures/Studies:    Dg Chest 1 View  Result Date: 05/08/2016 CLINICAL DATA:  Malaise and fatigue. Hyperglycemia. History of AFib, CHF, diabetes, hypertension. EXAM: CHEST 1 VIEW COMPARISON:  02/12/2016 FINDINGS: Patient has right-sided dialysis catheter, tip overlying level of the lower superior vena cava -upper right atrium. Heart size is upper normal. There is mild patchy density in the  left lower lobe raising the question of early infiltrate. No pulmonary edema. IMPRESSION: Suspect early infiltrate in the left lower lobe. Electronically Signed   By: Nolon Nations M.D.   On: 05/08/2016 20:59   Dg Toe 2nd Left  Result Date: 05/12/2016 CLINICAL DATA:  Wound on the tip of the left second toe, diabetes EXAM: LEFT SECOND TOE COMPARISON:  Left foot films of 03/17/2016 FINDINGS: There has been erosion of the majority of the distal phalanx of the left second toe consistent with active osteomyelitis. The bones are diffusely osteopenic. No other abnormality is seen. Arterial calcifications are present diffusely. IMPRESSION: Active osteomyelitis involves the distal phalanx of the left second toe. Electronically Signed   By: Ivar Drape M.D.   On: 05/12/2016 15:01   Dg Toe Great Right  Result Date: 05/12/2016 CLINICAL DATA:  Wound on the plantar aspect of the right great toe and second toe, the patient is diabetic EXAM: RIGHT GREAT TOE COMPARISON:  Right foot films of 03/17/2016 FINDINGS: The bones of the  right foot are diffusely osteopenic. There is degenerative change of the right first MTP joint B degenerative change in the DIP joints as well. However, no erosion or focal demineralization is seen to indicate osteomyelitis. Arterial calcifications are present diffusely. IMPRESSION: 1. No definite radiographic evidence of osteomyelitis is seen. 2. Degenerative change diffusely.  Osteopenia. Electronically Signed   By: Ivar Drape M.D.   On: 05/12/2016 15:00        Discharge Exam: Vitals:   05/14/16 0518 05/14/16 0927  BP: (!) 155/91 (!) 155/81  Pulse: 80 75  Resp: 14   Temp: 97.6 F (36.4 C)    Vitals:   05/13/16 1239 05/13/16 2212 05/14/16 0518 05/14/16 0927  BP: (!) 127/58 127/82 (!) 155/91 (!) 155/81  Pulse: 75 96 80 75  Resp: 19 15 14    Temp: 97.6 F (36.4 C) 97.5 F (36.4 C) 97.6 F (36.4 C)   TempSrc: Oral Oral Oral   SpO2: 100% 100% 100%   Weight:   81.7 kg (180 lb 1.9 oz)   Height:        General: Pt is alert, awake, not in acute distress Cardiovascular: RRR, S1/S2 +, no rubs, no gallops Respiratory: CTA bilaterally, no wheezing, no rhonchi Abdominal: Soft, NT, ND, bowel sounds + Extremities: no edema, no cyanosis    The results of significant diagnostics from this hospitalization (including imaging, microbiology, ancillary and laboratory) are listed below for reference.     Microbiology: Recent Results (from the past 240 hour(s))  Culture, blood (routine x 2)     Status: None (Preliminary result)   Collection Time: 05/08/16  9:45 PM  Result Value Ref Range Status   Specimen Description BLOOD RIGHT HAND  Final   Special Requests BOTTLES DRAWN AEROBIC ONLY 5CC  Final   Culture   Final    NO GROWTH 4 DAYS Performed at [facility], [address], [place], [place] [address]    Report Status PENDING  Incomplete  Culture, blood (routine x 2)     Status: None (Preliminary result)   Collection Time: 05/08/16  9:45 PM  Result Value Ref Range Status    Specimen Description BLOOD RIGHT HAND  Final   Special Requests IN PEDIATRIC BOTTLE 2CC  Final   Culture   Final    NO GROWTH 4 DAYS Performed at [facility], [place] [address], [place], [place] [address]    Report Status PENDING  Incomplete  MRSA PCR Screening     Status: Abnormal   Collection Time: 05/08/16 11:56 PM  Result Value Ref Range Status   MRSA by PCR POSITIVE (A) NEGATIVE Final    Comment:        The GeneXpert MRSA Assay (FDA approved for NASAL specimens only), is one component of a comprehensive MRSA colonization surveillance program. It is not intended to diagnose MRSA infection nor to guide or monitor treatment for MRSA infections. RESULT CALLED TO, READ BACK BY AND VERIFIED WITH: OBRYANT,M RN 2.3.18 @0210  ZANDO,C   Culture, Urine     Status: Abnormal   Collection Time: 05/09/16  2:49 PM  Result Value Ref Range Status   Specimen Description URINE, RANDOM  Final   Special Requests NONE  Final   Culture >=100,000 COLONIES/mL YEAST (A)  Final   Report Status 05/10/2016 FINAL  Final     Labs: BNP (last 3 results) No results for input(s): BNP in the last 8760 hours. Basic Metabolic Panel:  Recent Labs Lab 05/09/16 0226 05/09/16 0850 05/10/16 0513 05/11/16 0527 05/12/16 0824  NA 142 144 143 143 141  K 3.1* 3.3* 3.9 3.9 3.3*  CL 118* 118* 117* 116* 116*  CO2 18* 20* 20* 21* 19*  GLUCOSE 320* 125* 360* 368* 97  BUN 39* 37* 35* 39* 38*  CREATININE 1.45* 1.38* 1.39* 1.56* 1.20  CALCIUM 8.4* 8.6* 8.4* 8.5* 8.0*  MG 2.0  --   --   --  1.4*   Liver Function Tests:  Recent Labs Lab 05/08/16 1700 05/11/16 0527  AST 26 18  ALT 11* 10*  ALKPHOS 39 38  BILITOT 1.2 0.9  PROT 5.8* 5.4*  ALBUMIN 2.5* 2.5*   No results for input(s): LIPASE, AMYLASE in the last 168 hours. No results for input(s): AMMONIA in the last 168 hours. CBC:  Recent Labs Lab 05/08/16 1700 05/09/16 0226 05/10/16 0513 05/11/16 0527  WBC 5.0 4.5 5.7 5.3  NEUTROABS 3.5 2.8   --   --   HGB 11.9* 10.9* 11.9* 11.5*  HCT 35.6* 31.5* 34.4* 35.1*  MCV 91.5 91.0 90.5 91.2  PLT 153 146* 134* 139*   Cardiac Enzymes:  Recent Labs Lab 05/08/16 2145 05/11/16 1611  TROPONINI <0.03 <0.03   BNP: Invalid input(s): POCBNP CBG:  Recent Labs Lab 05/13/16 0907 05/13/16 1152 05/13/16 1630 05/13/16 2209 05/14/16 0806  GLUCAP 83 98 116* 270* 307*   D-Dimer No results for input(s): DDIMER in the last 72 hours. Hgb A1c No results for input(s): HGBA1C in the last 72 hours. Lipid Profile No results for input(s): CHOL, HDL, LDLCALC, TRIG, CHOLHDL, LDLDIRECT in the last 72 hours. Thyroid function studies No results for input(s): TSH, T4TOTAL, T3FREE, THYROIDAB in the last 72 hours.  Invalid input(s): FREET3 Anemia work up No results for input(s): VITAMINB12, FOLATE, FERRITIN, TIBC, IRON, RETICCTPCT in the last 72 hours. Urinalysis    Component Value Date/Time   COLORURINE YELLOW 05/09/2016 0653   APPEARANCEUR HAZY (A) 05/09/2016 0653   LABSPEC 1.011 05/09/2016 0653   PHURINE 5.0 05/09/2016 0653   GLUCOSEU >=500 (A) 05/09/2016 0653   GLUCOSEU NEGATIVE 10/18/2012 0857   HGBUR SMALL (A) 05/09/2016 0653   BILIRUBINUR NEGATIVE 05/09/2016 0653   KETONESUR NEGATIVE 05/09/2016 0653   PROTEINUR 30 (A) 05/09/2016 0653   UROBILINOGEN 0.2 10/18/2012 0857   NITRITE NEGATIVE 05/09/2016 0653   LEUKOCYTESUR LARGE (A) 05/09/2016 0653   Sepsis Labs Invalid input(s): PROCALCITONIN,  WBC,  LACTICIDVEN Microbiology Recent Results (from the past 240  hour(s))  Culture, blood (routine x 2)     Status: None (Preliminary result)   Collection Time: 05/08/16  9:45 PM  Result Value Ref Range Status   Specimen Description BLOOD RIGHT HAND  Final   Special Requests BOTTLES DRAWN AEROBIC ONLY 5CC  Final   Culture   Final    NO GROWTH 4 DAYS Performed at [facility], [place] [address], [place], [place] [address]    Report Status PENDING  Incomplete  Culture, blood (routine  x 2)     Status: None (Preliminary result)   Collection Time: 05/08/16  9:45 PM  Result Value Ref Range Status   Specimen Description BLOOD RIGHT HAND  Final   Special Requests IN PEDIATRIC BOTTLE 2CC  Final   Culture   Final    NO GROWTH 4 DAYS Performed at [facility], [place] [address], [place], [place] [address]    Report Status PENDING  Incomplete  MRSA PCR Screening     Status: Abnormal   Collection Time: 05/08/16 11:56 PM  Result Value Ref Range Status   MRSA by PCR POSITIVE (A) NEGATIVE Final    Comment:        The GeneXpert MRSA Assay (FDA approved for NASAL specimens only), is one component of a comprehensive MRSA colonization surveillance program. It is not intended to diagnose MRSA infection nor to guide or monitor treatment for MRSA infections. RESULT CALLED TO, READ BACK BY AND VERIFIED WITH: OBRYANT,M RN 2.3.18 @0210  ZANDO,C   Culture, Urine     Status: Abnormal   Collection Time: 05/09/16  2:49 PM  Result Value Ref Range Status   Specimen Description URINE, RANDOM  Final   Special Requests NONE  Final   Culture >=100,000 COLONIES/mL YEAST (A)  Final   Report Status 05/10/2016 FINAL  Final     Time coordinating discharge: Over 30 minutes  SIGNED:   Debbe Odea, MD  [facility] 05/14/2016, 11:17 AM Pager   If 7PM-7AM, please contact night-coverage [email or web address] Password [email or web address]

## 2016-05-14 NOTE — Progress Notes (Signed)
Patient refused morning lab. draws.  PCP was notified.

## 2016-05-14 NOTE — Progress Notes (Signed)
Spoke with pt's daughter Freada Bergeron [phone]) concerning pt's discharge for today.  Freada Bergeron states pt's caregiver will be in the home for pt's arrival.

## 2016-05-15 DIAGNOSIS — E11621 Type 2 diabetes mellitus with foot ulcer: Secondary | ICD-10-CM | POA: Diagnosis not present

## 2016-05-15 DIAGNOSIS — Z89412 Acquired absence of left great toe: Secondary | ICD-10-CM | POA: Diagnosis not present

## 2016-05-15 DIAGNOSIS — Z4781 Encounter for orthopedic aftercare following surgical amputation: Secondary | ICD-10-CM | POA: Diagnosis not present

## 2016-05-15 DIAGNOSIS — L97512 Non-pressure chronic ulcer of other part of right foot with fat layer exposed: Secondary | ICD-10-CM | POA: Diagnosis not present

## 2016-05-15 DIAGNOSIS — I132 Hypertensive heart and chronic kidney disease with heart failure and with stage 5 chronic kidney disease, or end stage renal disease: Secondary | ICD-10-CM | POA: Diagnosis not present

## 2016-05-15 DIAGNOSIS — I5020 Unspecified systolic (congestive) heart failure: Secondary | ICD-10-CM | POA: Diagnosis not present

## 2016-05-18 ENCOUNTER — Other Ambulatory Visit: Payer: Self-pay

## 2016-05-18 DIAGNOSIS — E11621 Type 2 diabetes mellitus with foot ulcer: Secondary | ICD-10-CM | POA: Diagnosis not present

## 2016-05-18 DIAGNOSIS — L97512 Non-pressure chronic ulcer of other part of right foot with fat layer exposed: Secondary | ICD-10-CM | POA: Diagnosis not present

## 2016-05-18 DIAGNOSIS — I5020 Unspecified systolic (congestive) heart failure: Secondary | ICD-10-CM | POA: Diagnosis not present

## 2016-05-18 DIAGNOSIS — I132 Hypertensive heart and chronic kidney disease with heart failure and with stage 5 chronic kidney disease, or end stage renal disease: Secondary | ICD-10-CM | POA: Diagnosis not present

## 2016-05-18 DIAGNOSIS — Z89412 Acquired absence of left great toe: Secondary | ICD-10-CM | POA: Diagnosis not present

## 2016-05-18 DIAGNOSIS — Z4781 Encounter for orthopedic aftercare following surgical amputation: Secondary | ICD-10-CM | POA: Diagnosis not present

## 2016-05-18 MED ORDER — LEVOTHYROXINE SODIUM 125 MCG PO TABS: 125.0000 ug | ORAL_TABLET | Freq: Every morning | ORAL | 3 refills | Status: AC

## 2016-05-19 ENCOUNTER — Telehealth: Payer: Self-pay | Admitting: Endocrinology

## 2016-05-19 DIAGNOSIS — Z8679 Personal history of other diseases of the circulatory system: Secondary | ICD-10-CM | POA: Diagnosis not present

## 2016-05-19 DIAGNOSIS — Z89412 Acquired absence of left great toe: Secondary | ICD-10-CM | POA: Diagnosis not present

## 2016-05-19 DIAGNOSIS — N184 Chronic kidney disease, stage 4 (severe): Secondary | ICD-10-CM | POA: Diagnosis not present

## 2016-05-19 DIAGNOSIS — Z87438 Personal history of other diseases of male genital organs: Secondary | ICD-10-CM | POA: Diagnosis not present

## 2016-05-19 DIAGNOSIS — Z940 Kidney transplant status: Secondary | ICD-10-CM | POA: Diagnosis not present

## 2016-05-19 DIAGNOSIS — Z9889 Other specified postprocedural states: Secondary | ICD-10-CM | POA: Diagnosis not present

## 2016-05-19 DIAGNOSIS — Z85820 Personal history of malignant melanoma of skin: Secondary | ICD-10-CM | POA: Diagnosis not present

## 2016-05-19 DIAGNOSIS — Z8614 Personal history of Methicillin resistant Staphylococcus aureus infection: Secondary | ICD-10-CM | POA: Diagnosis not present

## 2016-05-19 DIAGNOSIS — Z8639 Personal history of other endocrine, nutritional and metabolic disease: Secondary | ICD-10-CM | POA: Diagnosis not present

## 2016-05-19 DIAGNOSIS — E1165 Type 2 diabetes mellitus with hyperglycemia: Secondary | ICD-10-CM | POA: Diagnosis not present

## 2016-05-19 NOTE — Telephone Encounter (Signed)
Christina patient care giver called stated this morning patient b/s was 471 Now it is 571, please advise

## 2016-05-20 DIAGNOSIS — Z8614 Personal history of Methicillin resistant Staphylococcus aureus infection: Secondary | ICD-10-CM | POA: Diagnosis not present

## 2016-05-20 DIAGNOSIS — N184 Chronic kidney disease, stage 4 (severe): Secondary | ICD-10-CM | POA: Diagnosis not present

## 2016-05-20 DIAGNOSIS — Z8639 Personal history of other endocrine, nutritional and metabolic disease: Secondary | ICD-10-CM | POA: Diagnosis not present

## 2016-05-20 DIAGNOSIS — Z8679 Personal history of other diseases of the circulatory system: Secondary | ICD-10-CM | POA: Diagnosis not present

## 2016-05-20 DIAGNOSIS — E1165 Type 2 diabetes mellitus with hyperglycemia: Secondary | ICD-10-CM | POA: Diagnosis not present

## 2016-05-20 DIAGNOSIS — Z87438 Personal history of other diseases of male genital organs: Secondary | ICD-10-CM | POA: Diagnosis not present

## 2016-05-20 NOTE — Telephone Encounter (Signed)
Glucose today was 132 and he did not take any Novolog for his Boost blood sugar after the boost was 243 Advised him to take 8 units of NovoLog before any meal or drinking boost Continue 15 Lantus at night

## 2016-05-21 DIAGNOSIS — Z8639 Personal history of other endocrine, nutritional and metabolic disease: Secondary | ICD-10-CM | POA: Diagnosis not present

## 2016-05-21 DIAGNOSIS — E1165 Type 2 diabetes mellitus with hyperglycemia: Secondary | ICD-10-CM | POA: Diagnosis not present

## 2016-05-21 DIAGNOSIS — Z8679 Personal history of other diseases of the circulatory system: Secondary | ICD-10-CM | POA: Diagnosis not present

## 2016-05-21 DIAGNOSIS — N184 Chronic kidney disease, stage 4 (severe): Secondary | ICD-10-CM | POA: Diagnosis not present

## 2016-05-21 DIAGNOSIS — Z8614 Personal history of Methicillin resistant Staphylococcus aureus infection: Secondary | ICD-10-CM | POA: Diagnosis not present

## 2016-05-21 DIAGNOSIS — Z87438 Personal history of other diseases of male genital organs: Secondary | ICD-10-CM | POA: Diagnosis not present

## 2016-05-22 DIAGNOSIS — Z8679 Personal history of other diseases of the circulatory system: Secondary | ICD-10-CM | POA: Diagnosis not present

## 2016-05-22 DIAGNOSIS — E1165 Type 2 diabetes mellitus with hyperglycemia: Secondary | ICD-10-CM | POA: Diagnosis not present

## 2016-05-22 DIAGNOSIS — Z87438 Personal history of other diseases of male genital organs: Secondary | ICD-10-CM | POA: Diagnosis not present

## 2016-05-22 DIAGNOSIS — Z8639 Personal history of other endocrine, nutritional and metabolic disease: Secondary | ICD-10-CM | POA: Diagnosis not present

## 2016-05-22 DIAGNOSIS — N184 Chronic kidney disease, stage 4 (severe): Secondary | ICD-10-CM | POA: Diagnosis not present

## 2016-05-22 DIAGNOSIS — Z8614 Personal history of Methicillin resistant Staphylococcus aureus infection: Secondary | ICD-10-CM | POA: Diagnosis not present

## 2016-05-24 DIAGNOSIS — N184 Chronic kidney disease, stage 4 (severe): Secondary | ICD-10-CM | POA: Diagnosis not present

## 2016-05-24 DIAGNOSIS — Z8639 Personal history of other endocrine, nutritional and metabolic disease: Secondary | ICD-10-CM | POA: Diagnosis not present

## 2016-05-24 DIAGNOSIS — E1165 Type 2 diabetes mellitus with hyperglycemia: Secondary | ICD-10-CM | POA: Diagnosis not present

## 2016-05-24 DIAGNOSIS — Z8679 Personal history of other diseases of the circulatory system: Secondary | ICD-10-CM | POA: Diagnosis not present

## 2016-05-24 DIAGNOSIS — Z8614 Personal history of Methicillin resistant Staphylococcus aureus infection: Secondary | ICD-10-CM | POA: Diagnosis not present

## 2016-05-24 DIAGNOSIS — Z87438 Personal history of other diseases of male genital organs: Secondary | ICD-10-CM | POA: Diagnosis not present

## 2016-05-25 DIAGNOSIS — Z8639 Personal history of other endocrine, nutritional and metabolic disease: Secondary | ICD-10-CM | POA: Diagnosis not present

## 2016-05-25 DIAGNOSIS — Z8614 Personal history of Methicillin resistant Staphylococcus aureus infection: Secondary | ICD-10-CM | POA: Diagnosis not present

## 2016-05-25 DIAGNOSIS — N184 Chronic kidney disease, stage 4 (severe): Secondary | ICD-10-CM | POA: Diagnosis not present

## 2016-05-25 DIAGNOSIS — Z8679 Personal history of other diseases of the circulatory system: Secondary | ICD-10-CM | POA: Diagnosis not present

## 2016-05-25 DIAGNOSIS — Z87438 Personal history of other diseases of male genital organs: Secondary | ICD-10-CM | POA: Diagnosis not present

## 2016-05-25 DIAGNOSIS — E1165 Type 2 diabetes mellitus with hyperglycemia: Secondary | ICD-10-CM | POA: Diagnosis not present

## 2016-05-26 DIAGNOSIS — N184 Chronic kidney disease, stage 4 (severe): Secondary | ICD-10-CM | POA: Diagnosis not present

## 2016-05-26 DIAGNOSIS — Z8639 Personal history of other endocrine, nutritional and metabolic disease: Secondary | ICD-10-CM | POA: Diagnosis not present

## 2016-05-26 DIAGNOSIS — Z87438 Personal history of other diseases of male genital organs: Secondary | ICD-10-CM | POA: Diagnosis not present

## 2016-05-26 DIAGNOSIS — E1165 Type 2 diabetes mellitus with hyperglycemia: Secondary | ICD-10-CM | POA: Diagnosis not present

## 2016-05-26 DIAGNOSIS — Z8614 Personal history of Methicillin resistant Staphylococcus aureus infection: Secondary | ICD-10-CM | POA: Diagnosis not present

## 2016-05-26 DIAGNOSIS — Z8679 Personal history of other diseases of the circulatory system: Secondary | ICD-10-CM | POA: Diagnosis not present

## 2016-05-27 ENCOUNTER — Ambulatory Visit: Payer: Medicare Other | Admitting: Endocrinology

## 2016-05-27 DIAGNOSIS — Z8679 Personal history of other diseases of the circulatory system: Secondary | ICD-10-CM | POA: Diagnosis not present

## 2016-05-27 DIAGNOSIS — Z8614 Personal history of Methicillin resistant Staphylococcus aureus infection: Secondary | ICD-10-CM | POA: Diagnosis not present

## 2016-05-27 DIAGNOSIS — Z8639 Personal history of other endocrine, nutritional and metabolic disease: Secondary | ICD-10-CM | POA: Diagnosis not present

## 2016-05-27 DIAGNOSIS — Z87438 Personal history of other diseases of male genital organs: Secondary | ICD-10-CM | POA: Diagnosis not present

## 2016-05-27 DIAGNOSIS — N184 Chronic kidney disease, stage 4 (severe): Secondary | ICD-10-CM | POA: Diagnosis not present

## 2016-05-27 DIAGNOSIS — E1165 Type 2 diabetes mellitus with hyperglycemia: Secondary | ICD-10-CM | POA: Diagnosis not present

## 2016-05-28 ENCOUNTER — Ambulatory Visit: Payer: Medicare Other | Admitting: Student

## 2016-05-28 DIAGNOSIS — Z8679 Personal history of other diseases of the circulatory system: Secondary | ICD-10-CM | POA: Diagnosis not present

## 2016-05-28 DIAGNOSIS — Z8614 Personal history of Methicillin resistant Staphylococcus aureus infection: Secondary | ICD-10-CM | POA: Diagnosis not present

## 2016-05-28 DIAGNOSIS — E1165 Type 2 diabetes mellitus with hyperglycemia: Secondary | ICD-10-CM | POA: Diagnosis not present

## 2016-05-28 DIAGNOSIS — Z87438 Personal history of other diseases of male genital organs: Secondary | ICD-10-CM | POA: Diagnosis not present

## 2016-05-28 DIAGNOSIS — Z8639 Personal history of other endocrine, nutritional and metabolic disease: Secondary | ICD-10-CM | POA: Diagnosis not present

## 2016-05-28 DIAGNOSIS — N184 Chronic kidney disease, stage 4 (severe): Secondary | ICD-10-CM | POA: Diagnosis not present

## 2016-05-29 DIAGNOSIS — N184 Chronic kidney disease, stage 4 (severe): Secondary | ICD-10-CM | POA: Diagnosis not present

## 2016-05-29 DIAGNOSIS — Z8639 Personal history of other endocrine, nutritional and metabolic disease: Secondary | ICD-10-CM | POA: Diagnosis not present

## 2016-05-29 DIAGNOSIS — Z8614 Personal history of Methicillin resistant Staphylococcus aureus infection: Secondary | ICD-10-CM | POA: Diagnosis not present

## 2016-05-29 DIAGNOSIS — Z87438 Personal history of other diseases of male genital organs: Secondary | ICD-10-CM | POA: Diagnosis not present

## 2016-05-29 DIAGNOSIS — E1165 Type 2 diabetes mellitus with hyperglycemia: Secondary | ICD-10-CM | POA: Diagnosis not present

## 2016-05-29 DIAGNOSIS — Z8679 Personal history of other diseases of the circulatory system: Secondary | ICD-10-CM | POA: Diagnosis not present

## 2016-06-04 DEATH — deceased

## 2016-09-10 ENCOUNTER — Encounter: Payer: Self-pay | Admitting: Family

## 2016-09-16 ENCOUNTER — Encounter (HOSPITAL_COMMUNITY): Payer: Medicare Other

## 2016-09-16 ENCOUNTER — Ambulatory Visit: Payer: Medicare Other | Admitting: Family

## 2016-09-16 ENCOUNTER — Other Ambulatory Visit (HOSPITAL_COMMUNITY): Payer: Medicare Other

## 2016-10-04 DEATH — deceased

## 2016-10-23 NOTE — Telephone Encounter (Signed)
error 

## 2017-07-30 IMAGING — DX DG CHEST 1V PORT
1 series · 1 of 1 positions shown · non-contrast
Comparison: 6760 hours today and earlier.

CLINICAL DATA: 75-year-old male with increasing shortness of breath
and abnormal left side pulmonary auscultation. Initial encounter.

EXAM:
PORTABLE CHEST 1 VIEW

[chest ap]
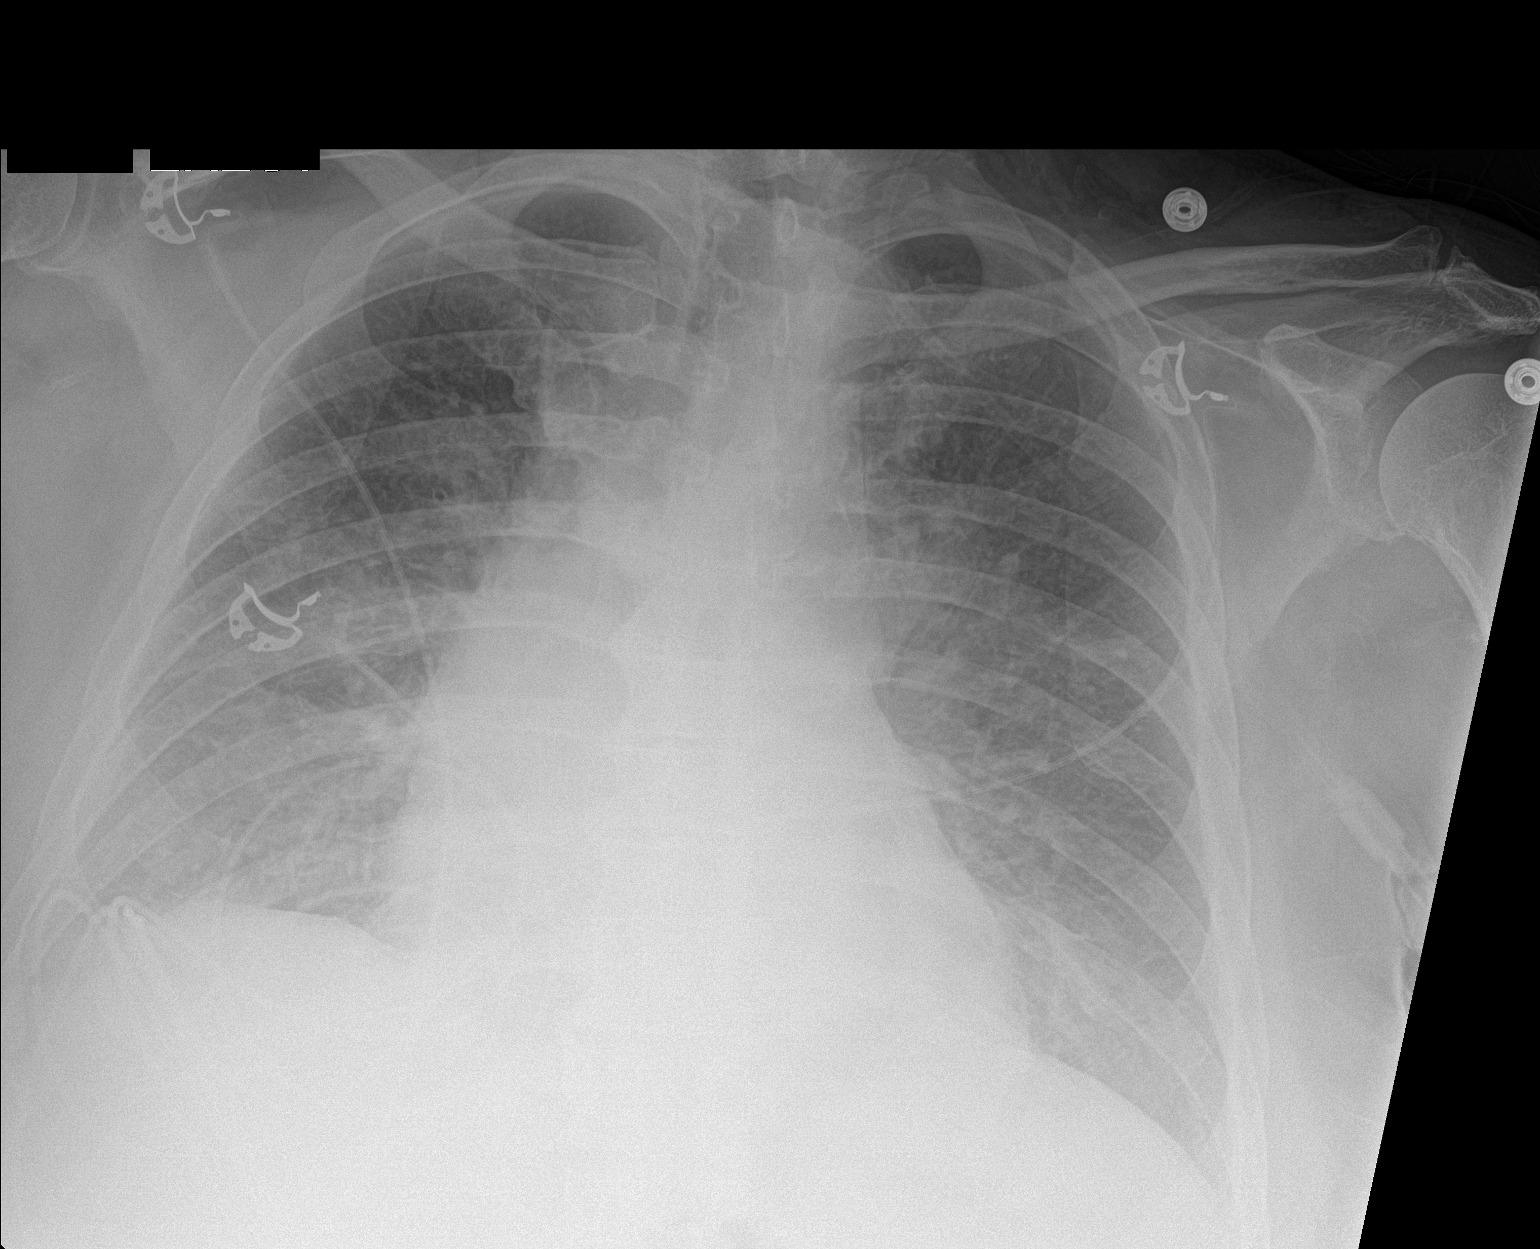

[1 of 1 positions shown; findings below may reference images not displayed]

FINDINGS: Portable AP semi upright view at 3130 hours. Continued and mildly
increased perihilar and basilar predominant interstitial pulmonary
opacity. Stable cardiac size and mediastinal contours. No
pneumothorax. No pleural effusion or consolidation identified.
IMPRESSION: Increased bilateral pulmonary interstitial opacity.

Top differential considerations include progression of interstitial
edema versus viral/ atypical respiratory infection.

No pleural effusion identified.

## 2017-07-30 IMAGING — CR DG CHEST 1V PORT
1 series · 1 of 1 positions shown · non-contrast
Comparison: Chest radiograph dated 11/05/2015

CLINICAL DATA: 75-year-old male with aspiration of liquid.

EXAM:
PORTABLE CHEST 1 VIEW

[AP]
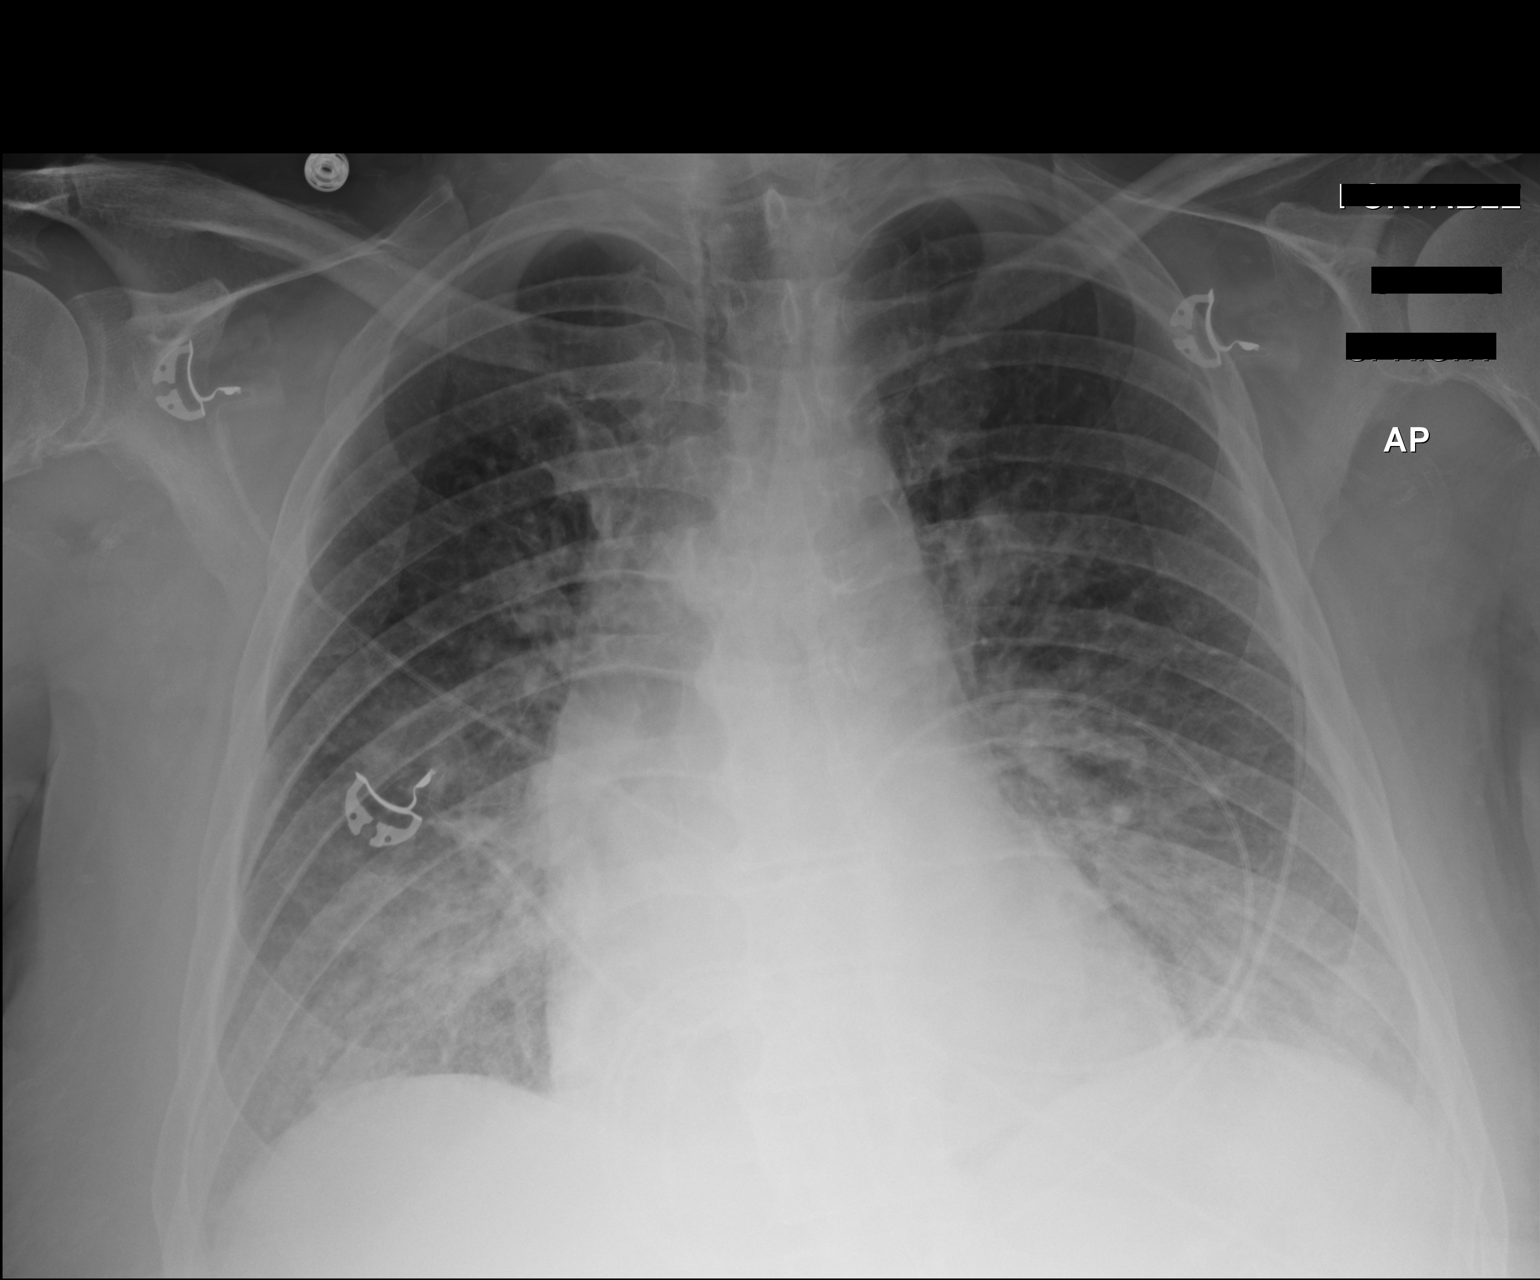

[1 of 1 positions shown; findings below may reference images not displayed]

FINDINGS: Mild cardiomegaly with central vascular congestion. Bibasilar
Interstitial prominence and nodular densities, new from prior study
may represent atelectatic changes versus infiltrate. There is no
pleural effusion or pneumothorax. Top-normal cardiac silhouette. No
acute osseous pathology.
IMPRESSION: Mild congestive changes with bibasilar subsegmental atelectasis
versus infiltrate.

## 2017-08-02 IMAGING — CR DG CHEST 1V PORT
1 series · 1 of 1 positions shown · non-contrast
Comparison: November 06, 2015

CLINICAL DATA: Shortness of breath

EXAM:
PORTABLE CHEST 1 VIEW

[AP]
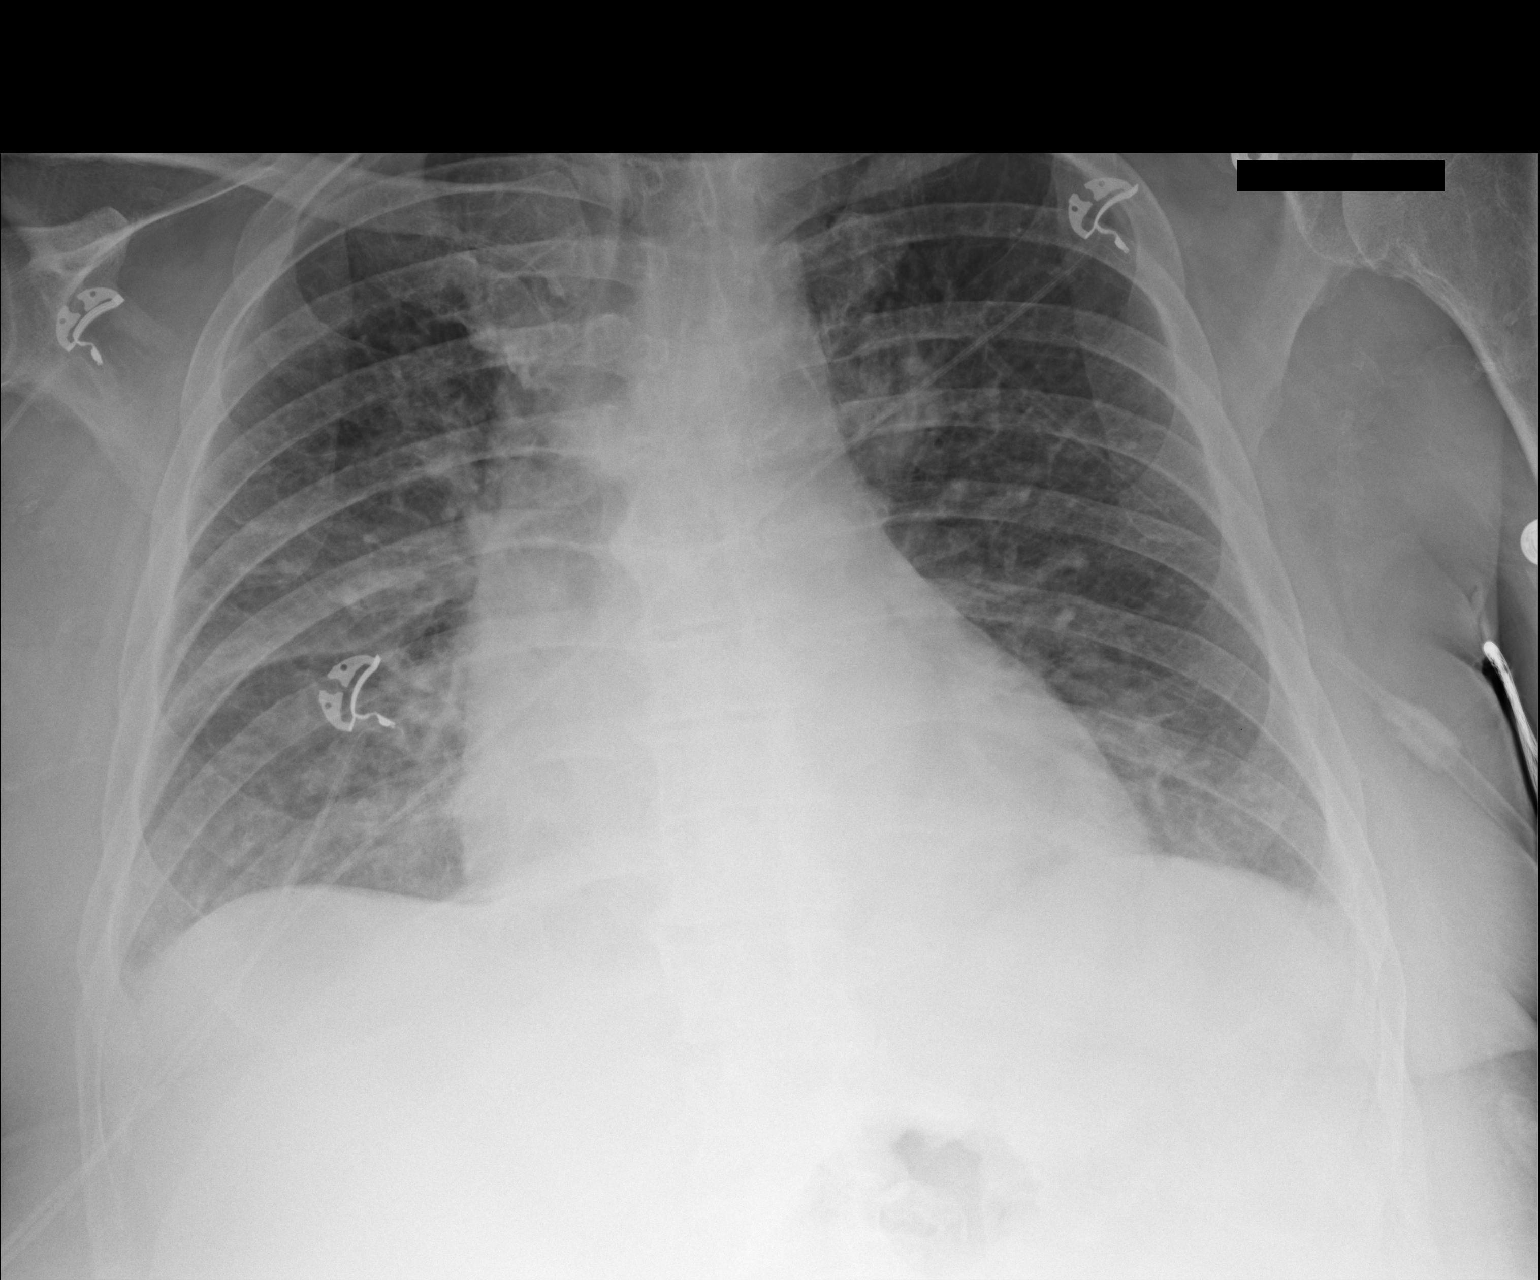

[1 of 1 positions shown; findings below may reference images not displayed]

FINDINGS: Mild cardiomegaly. The hila and mediastinum are unchanged. Diffuse
bilateral interstitial opacities again identified and similar in the
interval. The opacity is a little more focal in the left suprahilar
region which is increased since November 05, 2015 but unchanged since
November 06, 2015. No other acute interval changes. There may be tiny
bilateral effusions with blunting of the costophrenic angles.
IMPRESSION: 1. Suspected tiny bilateral pleural effusions and diffuse increased
interstitial markings are most consistent with pulmonary edema.
Atypical viral infection could have a similar appearance. Recommend
clinical correlation.

*Opacity in the left suprahilar region is a little more focal when
compared November 05, 2015. The difference could be due to confluence
of shadows. Recommend follow-up to resolution.

## 2017-08-12 IMAGING — CR DG CHEST 1V PORT
1 series · 1 of 1 positions shown · non-contrast
Comparison: 11/09/2015

CLINICAL DATA: As post dialysis catheter placement

EXAM:
PORTABLE CHEST 1 VIEW

[portable]
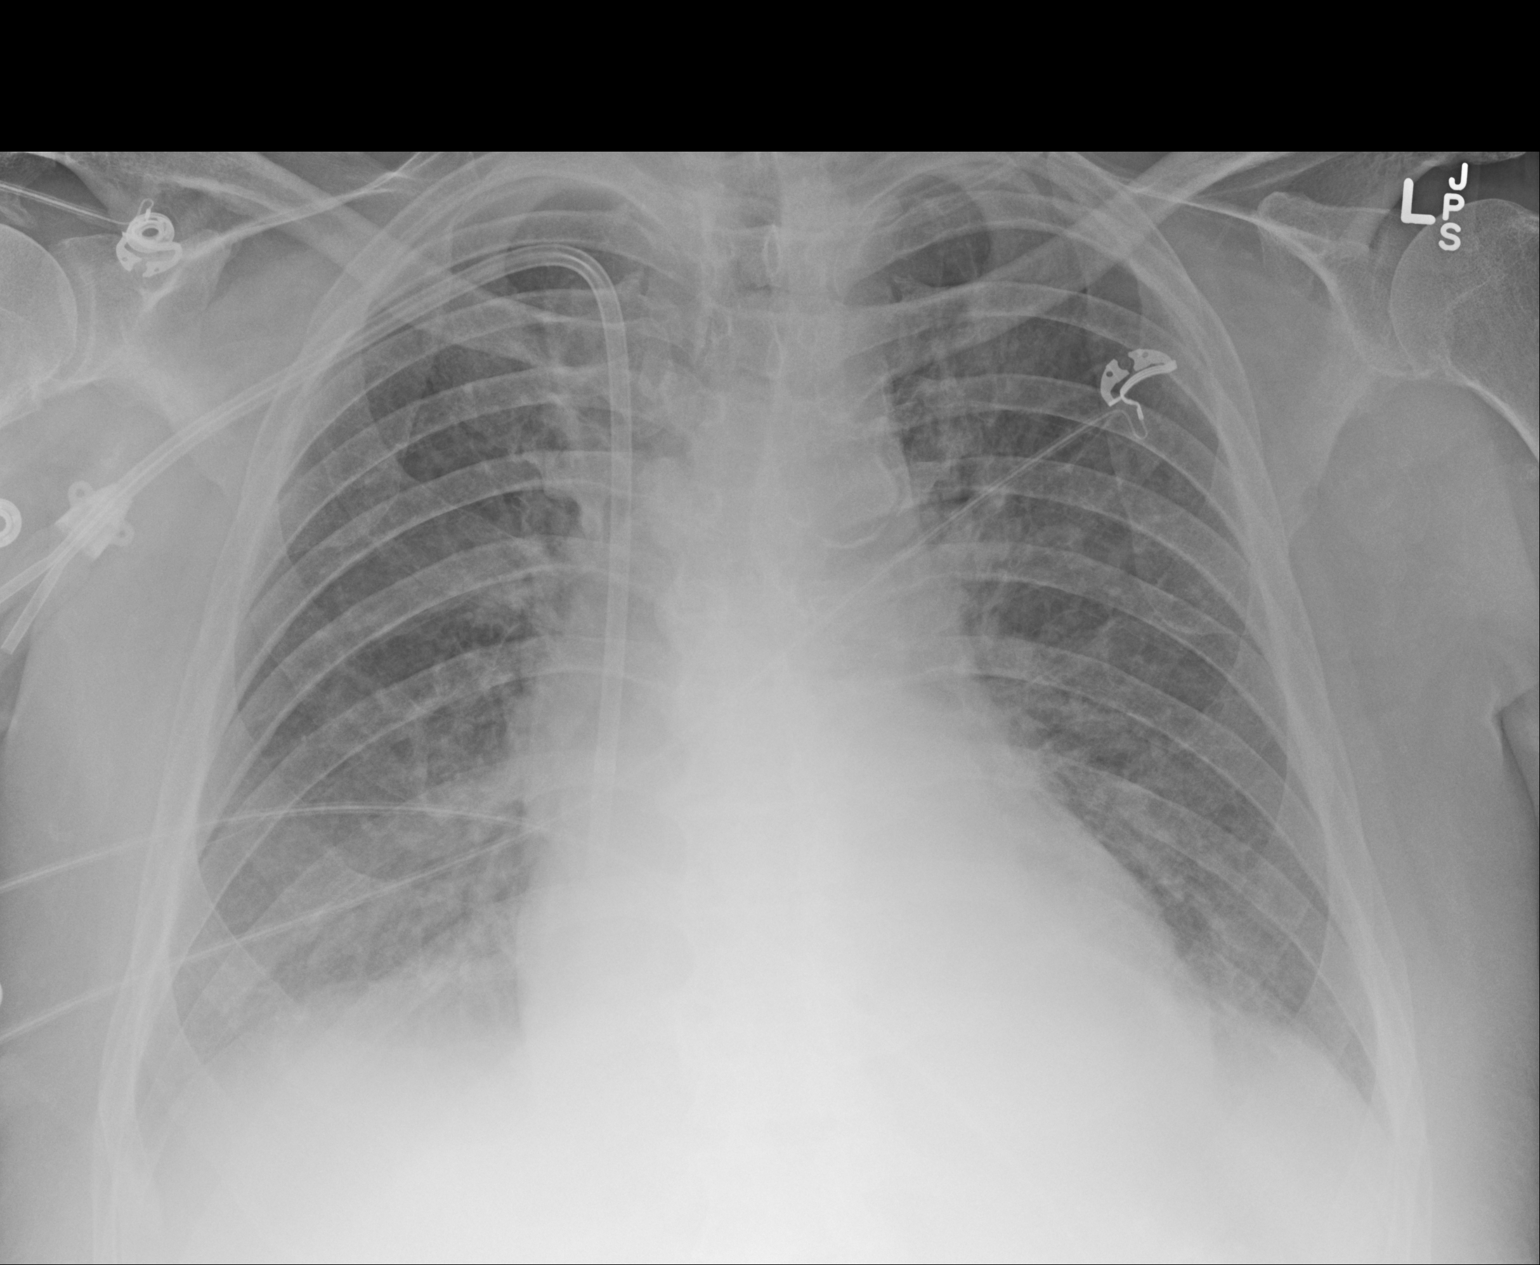

[1 of 1 positions shown; findings below may reference images not displayed]

FINDINGS: Previously seen temper a catheter is been removed in a new right
jugular dialysis catheter placed in satisfactory position. Cardiac
shadow is within normal limits. Vascular congestion is noted likely
related to volume overload. No sizable effusion is seen.
IMPRESSION: Vascular congestion likely related to volume overload. No
pneumothorax is noted.

## 2017-11-07 IMAGING — CT CT CHEST W/O CM
1 of 4 series · 14 of 46 positions shown, 16 images · non-contrast
Comparison: None.

CLINICAL DATA: Leukocytosis, mildly elevated amylase and lipase.
Renal transplant

EXAM:
CT CHEST, ABDOMEN AND PELVIS WITHOUT CONTRAST
TECHNIQUE: Multidetector CT imaging of the chest, abdomen and pelvis was
performed following the standard protocol without IV contrast.

[Series 201: cap with, idose (2) · axial · 0.98mm/px · z∈[+125,+710]mm · 14 of 136 slices shown, 16 images]
[im 10/136  soft-tissue]
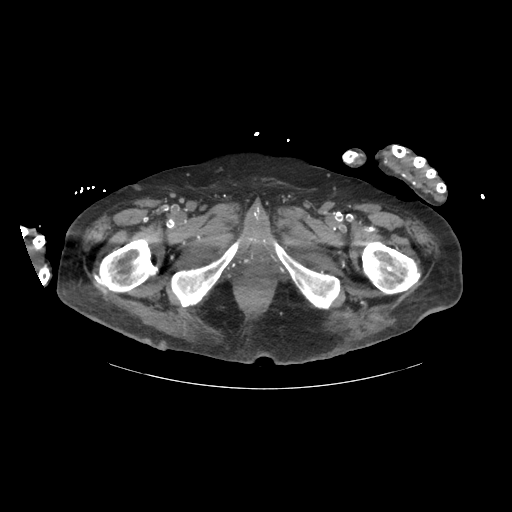
[im 10/136  bone]
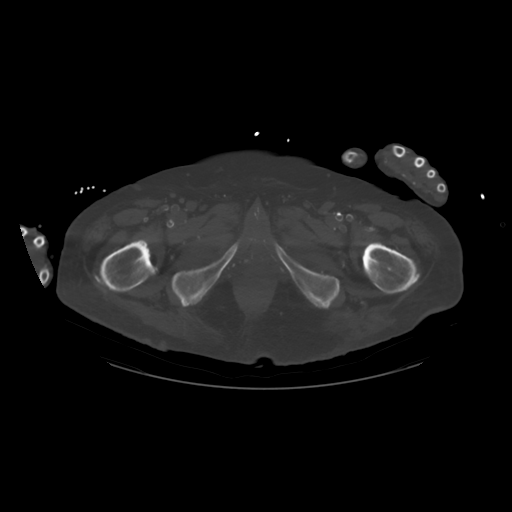
[im 19/136  soft-tissue]
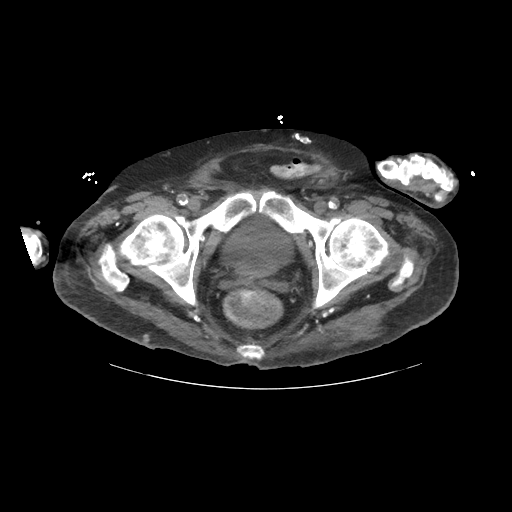
[im 28/136  soft-tissue]
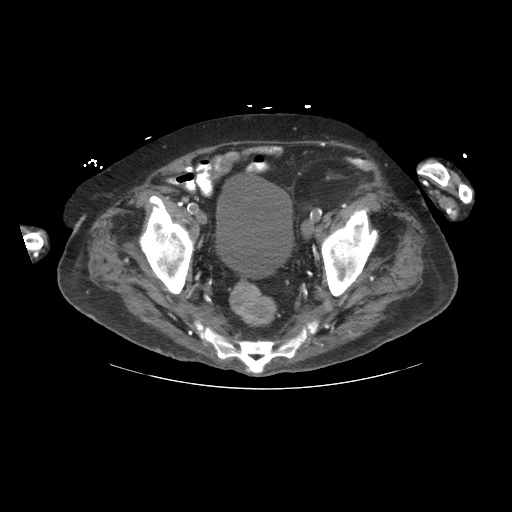
[im 37/136  soft-tissue]
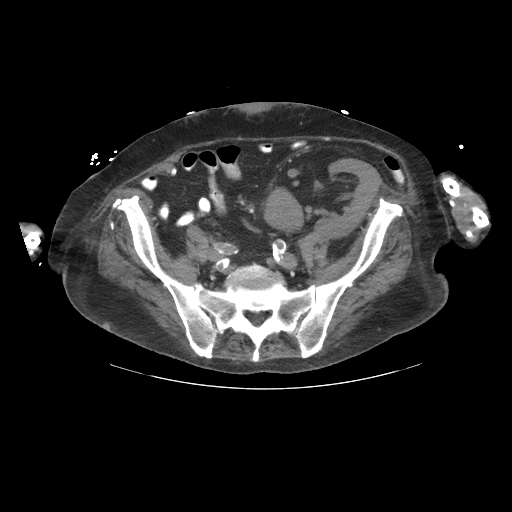
[im 46/136  soft-tissue]
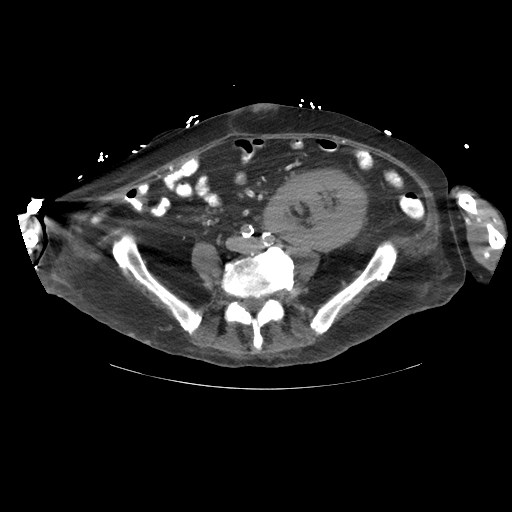
[im 55/136  soft-tissue]
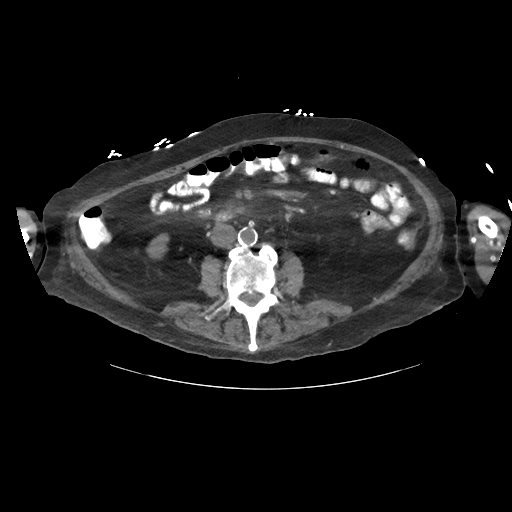
[im 64/136  soft-tissue]
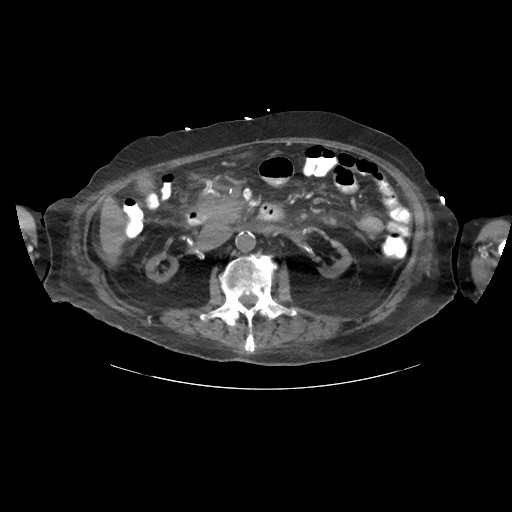
[im 73/136  soft-tissue]
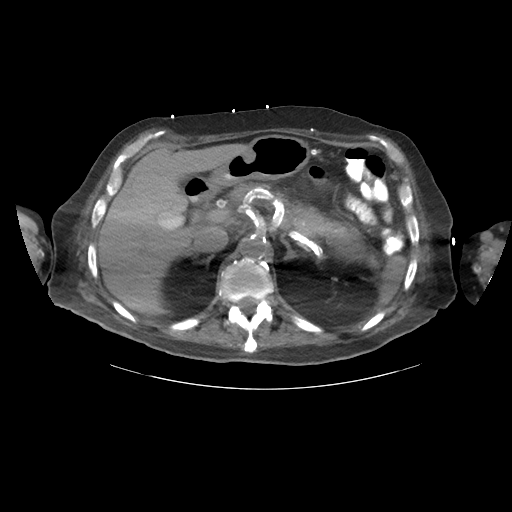
[im 82/136  soft-tissue]
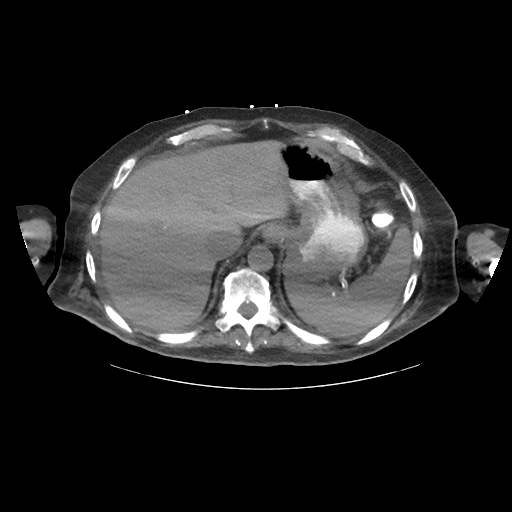
[im 82/136  bone]
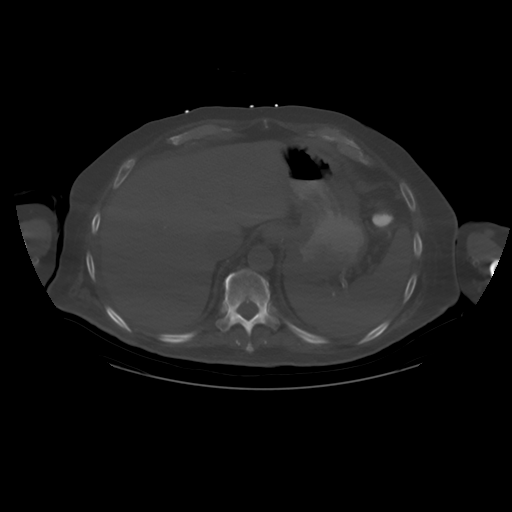
[im 91/136  soft-tissue]
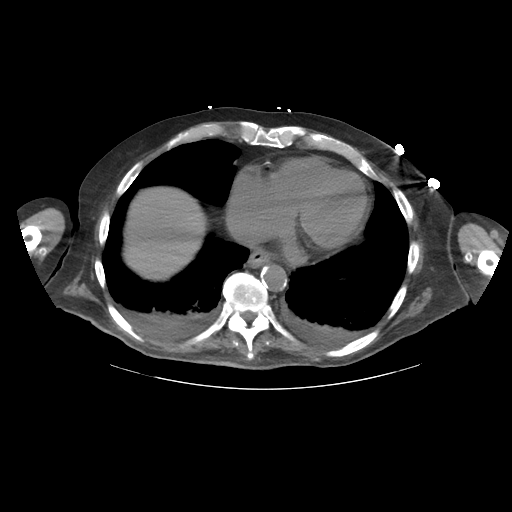
[im 100/136  soft-tissue]
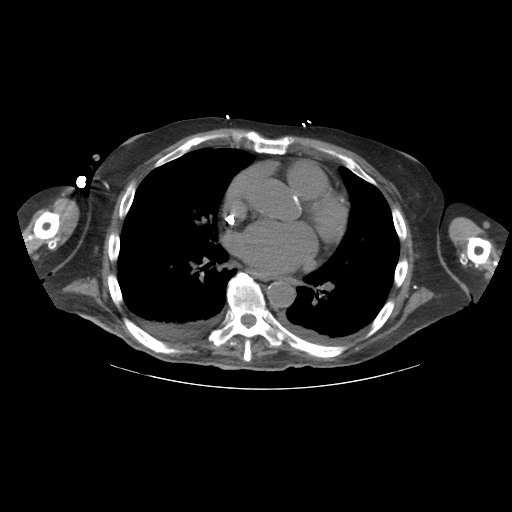
[im 109/136  soft-tissue]
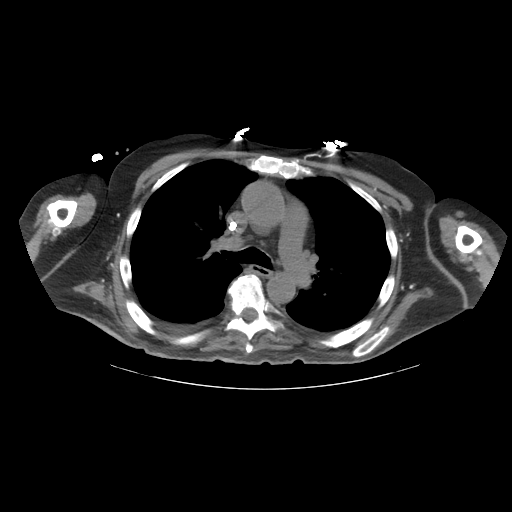
[im 118/136  soft-tissue]
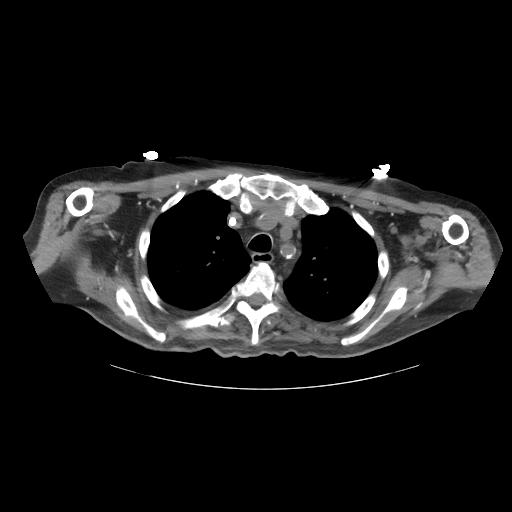
[im 127/136  soft-tissue]
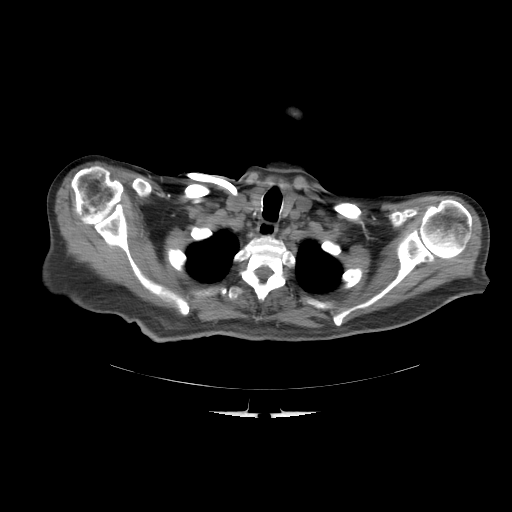

[14 of 46 positions shown; findings below may reference images not displayed]

FINDINGS: CT CHEST FINDINGS

Cardiovascular: Coronary artery calcification and aortic
atherosclerotic calcification.

Mediastinum/Nodes: No axillary or supraclavicular adenopathy no
mediastinal adenopathy. Esophagus. AUAD catheter with tip at the
cavoatrial junction

Lungs/Pleura: Bilateral small pleural effusions. Mild interstitial
edema. No overt pulmonary edema. No airspace consolidation. Airways
normal.

Musculoskeletal: No aggressive osseous lesion.

CT ABDOMEN AND PELVIS FINDINGS

Hepatobiliary: No focal hepatic lesions noncontrast exam. Sludge
versus small stones collect gallbladder. The gallbladder is not
distended.

Pancreas: There is haziness about the pancreatic head and second
portion the duodenum. No organized fluid collections. The pancreatic
parenchyma is grossly normal on noncontrast exam. No pancreatic
mass.

Small amount fluid along the LEFT anterior para renal fascia towards
tail of the pancreas (image 67, series 20).

Spleen: Normal spleen

Adrenals/urinary tract: Adrenal glands are normal. The kidneys are
atrophic. The transplant kidney within the LEFT iliac fossa without
evidence of hydronephrosis. Bladder normal.

Stomach/Bowel: Stomach, duodenum, small-bowel cecum are normal. The
colon and rectosigmoid colon normal. No evidence of bowel
obstruction

Vascular/Lymphatic: Abdominal aorta is normal caliber with
atherosclerotic calcification. There is no retroperitoneal or
periportal lymphadenopathy. No pelvic lymphadenopathy.

Reproductive: Prostate normal

Other: No ascites.  No abscess appear

Musculoskeletal: No aggressive osseous lesion.
IMPRESSION: Chest Impression:

1. Bilateral pleural effusions and mild interstitial edema.
2. No overt pulmonary edema.  No pneumonia.
3. Coronary artery calcification and aortic atherosclerotic
calcification.

Abdomen / Pelvis Impression:

1. Inflammation about the pancreatic head and second portion
duodenum. Findings are most suggestive of MILD PANCREATITIS versus
less likely duodenitis. No organized fluid collections. Pancreas is
grossly normal on this noncontrast exam.
2. Sludge versus small stones within the gallbladder. No gallbladder
distension.
3. Transplant in the LEFT iliac fossa without complication on
noncontrast exam

All
